# Patient Record
Sex: Female | Born: 1935
Health system: Southern US, Community
[De-identification: ages and names within clinical notes are randomized; demographics above are authoritative.]

## PROBLEM LIST (undated history)

## (undated) DIAGNOSIS — I679 Cerebrovascular disease, unspecified: Secondary | ICD-10-CM

## (undated) DIAGNOSIS — F319 Bipolar disorder, unspecified: Secondary | ICD-10-CM

## (undated) DIAGNOSIS — F29 Unspecified psychosis not due to a substance or known physiological condition: Secondary | ICD-10-CM

## (undated) DIAGNOSIS — A0472 Enterocolitis due to Clostridium difficile, not specified as recurrent: Secondary | ICD-10-CM

## (undated) DIAGNOSIS — Z8679 Personal history of other diseases of the circulatory system: Secondary | ICD-10-CM

## (undated) DIAGNOSIS — IMO0002 Reserved for concepts with insufficient information to code with codable children: Secondary | ICD-10-CM

## (undated) DIAGNOSIS — J984 Other disorders of lung: Secondary | ICD-10-CM

## (undated) DIAGNOSIS — Z8639 Personal history of other endocrine, nutritional and metabolic disease: Secondary | ICD-10-CM

## (undated) DIAGNOSIS — K635 Polyp of colon: Secondary | ICD-10-CM

## (undated) DIAGNOSIS — R55 Syncope and collapse: Secondary | ICD-10-CM

## (undated) DIAGNOSIS — E669 Obesity, unspecified: Secondary | ICD-10-CM

## (undated) DIAGNOSIS — R413 Other amnesia: Secondary | ICD-10-CM

## (undated) DIAGNOSIS — E785 Hyperlipidemia, unspecified: Secondary | ICD-10-CM

## (undated) DIAGNOSIS — K5090 Crohn's disease, unspecified, without complications: Secondary | ICD-10-CM

## (undated) DIAGNOSIS — E039 Hypothyroidism, unspecified: Secondary | ICD-10-CM

## (undated) DIAGNOSIS — I2510 Atherosclerotic heart disease of native coronary artery without angina pectoris: Secondary | ICD-10-CM

## (undated) DIAGNOSIS — I10 Essential (primary) hypertension: Secondary | ICD-10-CM

## (undated) DIAGNOSIS — K589 Irritable bowel syndrome without diarrhea: Secondary | ICD-10-CM

## (undated) DIAGNOSIS — K5730 Diverticulosis of large intestine without perforation or abscess without bleeding: Secondary | ICD-10-CM

## (undated) DIAGNOSIS — Z87898 Personal history of other specified conditions: Secondary | ICD-10-CM

## (undated) DIAGNOSIS — M8580 Other specified disorders of bone density and structure, unspecified site: Secondary | ICD-10-CM

## (undated) DIAGNOSIS — G4733 Obstructive sleep apnea (adult) (pediatric): Secondary | ICD-10-CM

## (undated) DIAGNOSIS — A159 Respiratory tuberculosis unspecified: Secondary | ICD-10-CM

## (undated) DIAGNOSIS — G2581 Restless legs syndrome: Secondary | ICD-10-CM

## (undated) DIAGNOSIS — K219 Gastro-esophageal reflux disease without esophagitis: Secondary | ICD-10-CM

## (undated) DIAGNOSIS — M797 Fibromyalgia: Secondary | ICD-10-CM

## (undated) DIAGNOSIS — Z8611 Personal history of tuberculosis: Secondary | ICD-10-CM

## (undated) DIAGNOSIS — Z8709 Personal history of other diseases of the respiratory system: Secondary | ICD-10-CM

## (undated) DIAGNOSIS — C4A9 Merkel cell carcinoma, unspecified: Secondary | ICD-10-CM

## (undated) HISTORY — PX: INCISION AND DRAINAGE PERIRECTAL ABSCESS: SHX1804

## (undated) HISTORY — DX: Personal history of other specified conditions: Z87.898

## (undated) HISTORY — DX: Other specified disorders of bone density and structure, unspecified site: M85.80

## (undated) HISTORY — DX: Polyp of colon: K63.5

## (undated) HISTORY — PX: CHOLECYSTECTOMY: SHX55

## (undated) HISTORY — PX: CATARACT EXTRACTION: SUR2

## (undated) HISTORY — DX: Essential (primary) hypertension: I10

## (undated) HISTORY — PX: BLADDER SUSPENSION: SHX72

## (undated) HISTORY — DX: Cerebrovascular disease, unspecified: I67.9

## (undated) HISTORY — PX: APPENDECTOMY: SHX54

## (undated) HISTORY — DX: Personal history of other diseases of the respiratory system: Z87.09

## (undated) HISTORY — DX: Fibromyalgia: M79.7

## (undated) HISTORY — DX: Merkel cell carcinoma, unspecified: C4A.9

## (undated) HISTORY — DX: Hyperlipidemia, unspecified: E78.5

## (undated) HISTORY — DX: Crohn's disease, unspecified, without complications: K50.90

## (undated) HISTORY — DX: Restless legs syndrome: G25.81

## (undated) HISTORY — DX: Gastro-esophageal reflux disease without esophagitis: K21.9

## (undated) HISTORY — DX: Hypothyroidism, unspecified: E03.9

## (undated) HISTORY — DX: Unspecified psychosis not due to a substance or known physiological condition: F29

## (undated) HISTORY — DX: Diverticulosis of large intestine without perforation or abscess without bleeding: K57.30

## (undated) HISTORY — DX: Personal history of tuberculosis: Z86.11

## (undated) HISTORY — DX: Reserved for concepts with insufficient information to code with codable children: IMO0002

## (undated) HISTORY — DX: Obesity, unspecified: E66.9

## (undated) HISTORY — DX: Syncope and collapse: R55

## (undated) HISTORY — DX: Other disorders of lung: J98.4

## (undated) HISTORY — DX: Enterocolitis due to Clostridium difficile, not specified as recurrent: A04.72

## (undated) HISTORY — DX: Personal history of other endocrine, nutritional and metabolic disease: Z86.39

## (undated) HISTORY — DX: Personal history of other diseases of the circulatory system: Z86.79

## (undated) HISTORY — DX: Atherosclerotic heart disease of native coronary artery without angina pectoris: I25.10

## (undated) HISTORY — DX: Obstructive sleep apnea (adult) (pediatric): G47.33

## (undated) HISTORY — PX: TONSILLECTOMY AND ADENOIDECTOMY: SHX28

## (undated) HISTORY — DX: Other amnesia: R41.3

## (undated) HISTORY — PX: NASAL SINUS SURGERY: SHX719

## (undated) HISTORY — DX: Bipolar disorder, unspecified: F31.9

## (undated) HISTORY — PX: HEEL SPUR EXCISION: SHX1733

## (undated) HISTORY — DX: Irritable bowel syndrome, unspecified: K58.9

## (undated) HISTORY — PX: LUMBAR LAMINECTOMY: SHX95

---

## 1997-07-25 ENCOUNTER — Other Ambulatory Visit: Admission: RE | Admit: 1997-07-25 | Discharge: 1997-07-25 | Payer: Self-pay | Admitting: *Deleted

## 1997-09-28 ENCOUNTER — Ambulatory Visit (HOSPITAL_COMMUNITY): Admission: RE | Admit: 1997-09-28 | Discharge: 1997-09-28 | Payer: Self-pay | Admitting: Specialist

## 1997-11-09 ENCOUNTER — Other Ambulatory Visit: Admission: RE | Admit: 1997-11-09 | Discharge: 1997-11-09 | Payer: Self-pay | Admitting: Gynecology

## 1998-12-15 ENCOUNTER — Other Ambulatory Visit: Admission: RE | Admit: 1998-12-15 | Discharge: 1998-12-15 | Payer: Self-pay | Admitting: Gynecology

## 1999-01-30 ENCOUNTER — Other Ambulatory Visit: Admission: RE | Admit: 1999-01-30 | Discharge: 1999-01-30 | Payer: Self-pay | Admitting: Gynecology

## 1999-01-30 ENCOUNTER — Encounter (INDEPENDENT_AMBULATORY_CARE_PROVIDER_SITE_OTHER): Payer: Self-pay | Admitting: Specialist

## 1999-01-31 ENCOUNTER — Encounter: Admission: RE | Admit: 1999-01-31 | Discharge: 1999-01-31 | Payer: Self-pay | Admitting: *Deleted

## 1999-01-31 ENCOUNTER — Encounter: Payer: Self-pay | Admitting: *Deleted

## 1999-02-01 ENCOUNTER — Ambulatory Visit (HOSPITAL_BASED_OUTPATIENT_CLINIC_OR_DEPARTMENT_OTHER): Admission: RE | Admit: 1999-02-01 | Discharge: 1999-02-01 | Payer: Self-pay | Admitting: *Deleted

## 1999-02-01 ENCOUNTER — Encounter (INDEPENDENT_AMBULATORY_CARE_PROVIDER_SITE_OTHER): Payer: Self-pay | Admitting: Specialist

## 1999-03-02 ENCOUNTER — Encounter (INDEPENDENT_AMBULATORY_CARE_PROVIDER_SITE_OTHER): Payer: Self-pay | Admitting: Specialist

## 1999-03-02 ENCOUNTER — Other Ambulatory Visit: Admission: RE | Admit: 1999-03-02 | Discharge: 1999-03-02 | Payer: Self-pay | Admitting: Gynecology

## 1999-12-26 ENCOUNTER — Other Ambulatory Visit: Admission: RE | Admit: 1999-12-26 | Discharge: 1999-12-26 | Payer: Self-pay | Admitting: Gynecology

## 2000-03-24 ENCOUNTER — Encounter: Payer: Self-pay | Admitting: Orthopedic Surgery

## 2000-03-28 ENCOUNTER — Observation Stay (HOSPITAL_COMMUNITY): Admission: RE | Admit: 2000-03-28 | Discharge: 2000-03-29 | Payer: Self-pay | Admitting: Orthopedic Surgery

## 2000-07-30 ENCOUNTER — Ambulatory Visit (HOSPITAL_COMMUNITY): Admission: RE | Admit: 2000-07-30 | Discharge: 2000-07-30 | Payer: Self-pay | Admitting: Specialist

## 2001-01-28 ENCOUNTER — Other Ambulatory Visit: Admission: RE | Admit: 2001-01-28 | Discharge: 2001-01-28 | Payer: Self-pay | Admitting: Gynecology

## 2001-03-13 ENCOUNTER — Ambulatory Visit (HOSPITAL_BASED_OUTPATIENT_CLINIC_OR_DEPARTMENT_OTHER): Admission: RE | Admit: 2001-03-13 | Discharge: 2001-03-13 | Payer: Self-pay | Admitting: Urology

## 2002-01-25 ENCOUNTER — Other Ambulatory Visit: Admission: RE | Admit: 2002-01-25 | Discharge: 2002-01-25 | Payer: Self-pay | Admitting: Gynecology

## 2002-09-19 ENCOUNTER — Ambulatory Visit (HOSPITAL_BASED_OUTPATIENT_CLINIC_OR_DEPARTMENT_OTHER): Admission: RE | Admit: 2002-09-19 | Discharge: 2002-09-19 | Payer: Self-pay | Admitting: Neurology

## 2002-10-24 ENCOUNTER — Ambulatory Visit (HOSPITAL_BASED_OUTPATIENT_CLINIC_OR_DEPARTMENT_OTHER): Admission: RE | Admit: 2002-10-24 | Discharge: 2002-10-24 | Payer: Self-pay | Admitting: Neurology

## 2003-02-21 ENCOUNTER — Other Ambulatory Visit: Admission: RE | Admit: 2003-02-21 | Discharge: 2003-02-21 | Payer: Self-pay | Admitting: Gynecology

## 2003-05-06 ENCOUNTER — Ambulatory Visit (HOSPITAL_COMMUNITY): Admission: RE | Admit: 2003-05-06 | Discharge: 2003-05-07 | Payer: Self-pay | Admitting: Internal Medicine

## 2003-11-10 ENCOUNTER — Ambulatory Visit: Payer: Self-pay | Admitting: Internal Medicine

## 2003-11-17 ENCOUNTER — Ambulatory Visit: Payer: Self-pay | Admitting: Pulmonary Disease

## 2003-11-23 ENCOUNTER — Ambulatory Visit: Payer: Self-pay | Admitting: *Deleted

## 2003-12-02 ENCOUNTER — Ambulatory Visit: Payer: Self-pay | Admitting: Internal Medicine

## 2003-12-16 ENCOUNTER — Ambulatory Visit: Payer: Self-pay | Admitting: Internal Medicine

## 2003-12-27 ENCOUNTER — Ambulatory Visit: Payer: Self-pay | Admitting: Pulmonary Disease

## 2003-12-30 ENCOUNTER — Ambulatory Visit: Payer: Self-pay | Admitting: Cardiology

## 2004-01-06 ENCOUNTER — Ambulatory Visit: Payer: Self-pay | Admitting: Internal Medicine

## 2004-01-12 ENCOUNTER — Ambulatory Visit: Payer: Self-pay | Admitting: Cardiology

## 2004-02-09 ENCOUNTER — Ambulatory Visit: Payer: Self-pay | Admitting: Cardiology

## 2004-02-14 ENCOUNTER — Ambulatory Visit: Payer: Self-pay | Admitting: Cardiology

## 2004-02-23 ENCOUNTER — Other Ambulatory Visit: Admission: RE | Admit: 2004-02-23 | Discharge: 2004-02-23 | Payer: Self-pay | Admitting: *Deleted

## 2004-03-06 ENCOUNTER — Ambulatory Visit: Payer: Self-pay | Admitting: Internal Medicine

## 2004-03-07 ENCOUNTER — Ambulatory Visit: Payer: Self-pay | Admitting: Cardiology

## 2004-03-14 ENCOUNTER — Ambulatory Visit: Payer: Self-pay | Admitting: Internal Medicine

## 2004-03-14 ENCOUNTER — Ambulatory Visit: Payer: Self-pay

## 2004-03-28 ENCOUNTER — Ambulatory Visit: Payer: Self-pay | Admitting: Cardiology

## 2004-04-05 ENCOUNTER — Ambulatory Visit: Payer: Self-pay | Admitting: Internal Medicine

## 2004-04-11 ENCOUNTER — Ambulatory Visit: Payer: Self-pay | Admitting: Internal Medicine

## 2004-04-12 ENCOUNTER — Ambulatory Visit: Payer: Self-pay | Admitting: Gastroenterology

## 2004-04-16 ENCOUNTER — Ambulatory Visit: Payer: Self-pay | Admitting: Gastroenterology

## 2004-05-08 ENCOUNTER — Ambulatory Visit: Payer: Self-pay | Admitting: Gastroenterology

## 2004-05-16 ENCOUNTER — Ambulatory Visit: Payer: Self-pay | Admitting: Cardiology

## 2004-05-16 ENCOUNTER — Ambulatory Visit: Payer: Self-pay | Admitting: Internal Medicine

## 2004-05-17 ENCOUNTER — Ambulatory Visit: Payer: Self-pay | Admitting: Gastroenterology

## 2004-05-23 ENCOUNTER — Ambulatory Visit (HOSPITAL_COMMUNITY): Admission: RE | Admit: 2004-05-23 | Discharge: 2004-05-24 | Payer: Self-pay | Admitting: Internal Medicine

## 2004-05-24 ENCOUNTER — Ambulatory Visit: Payer: Self-pay | Admitting: Internal Medicine

## 2004-05-29 ENCOUNTER — Ambulatory Visit: Payer: Self-pay | Admitting: Gastroenterology

## 2004-05-31 ENCOUNTER — Ambulatory Visit: Payer: Self-pay | Admitting: Gastroenterology

## 2004-06-05 ENCOUNTER — Ambulatory Visit: Payer: Self-pay | Admitting: Pulmonary Disease

## 2004-06-18 ENCOUNTER — Encounter: Admission: RE | Admit: 2004-06-18 | Discharge: 2004-06-18 | Payer: Self-pay | Admitting: Orthopedic Surgery

## 2004-06-28 ENCOUNTER — Ambulatory Visit: Payer: Self-pay | Admitting: Gastroenterology

## 2004-07-16 ENCOUNTER — Ambulatory Visit: Payer: Self-pay | Admitting: Internal Medicine

## 2004-07-19 ENCOUNTER — Ambulatory Visit: Payer: Self-pay | Admitting: Endocrinology

## 2004-07-31 ENCOUNTER — Ambulatory Visit: Payer: Self-pay | Admitting: Gastroenterology

## 2004-08-31 ENCOUNTER — Ambulatory Visit: Payer: Self-pay | Admitting: Cardiology

## 2004-09-06 ENCOUNTER — Ambulatory Visit: Payer: Self-pay | Admitting: Gastroenterology

## 2004-09-18 ENCOUNTER — Ambulatory Visit: Payer: Self-pay | Admitting: Cardiology

## 2004-09-27 ENCOUNTER — Ambulatory Visit: Payer: Self-pay | Admitting: Cardiology

## 2004-10-02 ENCOUNTER — Ambulatory Visit: Payer: Self-pay | Admitting: Cardiology

## 2004-10-02 ENCOUNTER — Observation Stay (HOSPITAL_COMMUNITY): Admission: EM | Admit: 2004-10-02 | Discharge: 2004-10-03 | Payer: Self-pay | Admitting: Emergency Medicine

## 2004-10-05 ENCOUNTER — Ambulatory Visit: Payer: Self-pay | Admitting: Pulmonary Disease

## 2004-10-22 ENCOUNTER — Ambulatory Visit: Payer: Self-pay | Admitting: Pulmonary Disease

## 2004-10-25 ENCOUNTER — Ambulatory Visit: Payer: Self-pay | Admitting: Cardiology

## 2004-12-06 ENCOUNTER — Ambulatory Visit: Payer: Self-pay | Admitting: Pulmonary Disease

## 2004-12-17 ENCOUNTER — Ambulatory Visit (HOSPITAL_COMMUNITY): Admission: RE | Admit: 2004-12-17 | Discharge: 2004-12-17 | Payer: Self-pay | Admitting: Pulmonary Disease

## 2004-12-24 ENCOUNTER — Inpatient Hospital Stay (HOSPITAL_COMMUNITY): Admission: AD | Admit: 2004-12-24 | Discharge: 2004-12-26 | Payer: Self-pay | Admitting: Pulmonary Disease

## 2004-12-24 ENCOUNTER — Ambulatory Visit: Payer: Self-pay | Admitting: Pulmonary Disease

## 2005-01-07 HISTORY — PX: OTHER SURGICAL HISTORY: SHX169

## 2005-01-15 ENCOUNTER — Ambulatory Visit: Payer: Self-pay | Admitting: Pulmonary Disease

## 2005-01-23 ENCOUNTER — Ambulatory Visit: Payer: Self-pay | Admitting: Endocrinology

## 2005-01-31 ENCOUNTER — Inpatient Hospital Stay (HOSPITAL_COMMUNITY): Admission: EM | Admit: 2005-01-31 | Discharge: 2005-02-01 | Payer: Self-pay | Admitting: Emergency Medicine

## 2005-01-31 ENCOUNTER — Ambulatory Visit: Payer: Self-pay | Admitting: *Deleted

## 2005-01-31 ENCOUNTER — Ambulatory Visit: Payer: Self-pay | Admitting: Cardiology

## 2005-02-14 ENCOUNTER — Ambulatory Visit: Payer: Self-pay | Admitting: *Deleted

## 2005-02-26 ENCOUNTER — Other Ambulatory Visit: Admission: RE | Admit: 2005-02-26 | Discharge: 2005-02-26 | Payer: Self-pay | Admitting: Gynecology

## 2005-03-05 ENCOUNTER — Ambulatory Visit: Payer: Self-pay | Admitting: Pulmonary Disease

## 2005-03-07 ENCOUNTER — Ambulatory Visit: Payer: Self-pay | Admitting: Internal Medicine

## 2005-03-18 ENCOUNTER — Ambulatory Visit: Payer: Self-pay | Admitting: Pulmonary Disease

## 2005-04-26 ENCOUNTER — Ambulatory Visit: Payer: Self-pay | Admitting: Internal Medicine

## 2005-05-08 ENCOUNTER — Ambulatory Visit: Payer: Self-pay | Admitting: Internal Medicine

## 2005-05-13 ENCOUNTER — Ambulatory Visit: Payer: Self-pay | Admitting: Cardiology

## 2005-05-21 ENCOUNTER — Inpatient Hospital Stay (HOSPITAL_COMMUNITY): Admission: RE | Admit: 2005-05-21 | Discharge: 2005-05-25 | Payer: Self-pay | Admitting: Specialist

## 2005-05-21 ENCOUNTER — Encounter (INDEPENDENT_AMBULATORY_CARE_PROVIDER_SITE_OTHER): Payer: Self-pay | Admitting: Specialist

## 2005-06-19 ENCOUNTER — Ambulatory Visit: Payer: Self-pay | Admitting: Pulmonary Disease

## 2005-06-20 ENCOUNTER — Ambulatory Visit: Payer: Self-pay | Admitting: Cardiology

## 2005-08-16 ENCOUNTER — Ambulatory Visit: Payer: Self-pay | Admitting: Cardiology

## 2005-08-22 ENCOUNTER — Ambulatory Visit: Payer: Self-pay | Admitting: Cardiology

## 2005-09-23 ENCOUNTER — Encounter: Admission: RE | Admit: 2005-09-23 | Discharge: 2005-09-23 | Payer: Self-pay | Admitting: Endocrinology

## 2005-09-24 ENCOUNTER — Ambulatory Visit: Payer: Self-pay | Admitting: Family Medicine

## 2005-10-25 ENCOUNTER — Ambulatory Visit: Payer: Self-pay | Admitting: Pulmonary Disease

## 2005-11-05 ENCOUNTER — Ambulatory Visit: Payer: Self-pay

## 2005-11-07 ENCOUNTER — Ambulatory Visit (HOSPITAL_COMMUNITY): Admission: RE | Admit: 2005-11-07 | Discharge: 2005-11-08 | Payer: Self-pay | Admitting: Surgery

## 2005-11-14 ENCOUNTER — Ambulatory Visit: Payer: Self-pay | Admitting: Internal Medicine

## 2005-12-12 ENCOUNTER — Ambulatory Visit: Payer: Self-pay | Admitting: Internal Medicine

## 2006-01-22 ENCOUNTER — Ambulatory Visit: Payer: Self-pay | Admitting: Pulmonary Disease

## 2006-02-10 ENCOUNTER — Ambulatory Visit: Payer: Self-pay

## 2006-02-10 LAB — CONVERTED CEMR LAB
ALT: 55 units/L — ABNORMAL HIGH (ref 0–40)
Alkaline Phosphatase: 53 units/L (ref 39–117)
Total Bilirubin: 0.5 mg/dL (ref 0.3–1.2)

## 2006-02-13 ENCOUNTER — Ambulatory Visit: Payer: Self-pay | Admitting: Cardiology

## 2006-02-27 ENCOUNTER — Other Ambulatory Visit: Admission: RE | Admit: 2006-02-27 | Discharge: 2006-02-27 | Payer: Self-pay | Admitting: Gynecology

## 2006-04-22 ENCOUNTER — Ambulatory Visit: Payer: Self-pay

## 2006-04-22 LAB — CONVERTED CEMR LAB
Alkaline Phosphatase: 50 units/L (ref 39–117)
Bilirubin, Direct: 0.1 mg/dL (ref 0.0–0.3)
Total Bilirubin: 0.6 mg/dL (ref 0.3–1.2)
Total Protein: 6.7 g/dL (ref 6.0–8.3)
Triglycerides: 131 mg/dL (ref 0–149)

## 2006-04-24 ENCOUNTER — Ambulatory Visit: Payer: Self-pay | Admitting: Internal Medicine

## 2006-05-16 ENCOUNTER — Ambulatory Visit: Payer: Self-pay

## 2006-05-16 LAB — CONVERTED CEMR LAB
BUN: 12 mg/dL (ref 6–23)
Basophils Relative: 0.7 % (ref 0.0–1.0)
CO2: 31 meq/L (ref 19–32)
Chloride: 104 meq/L (ref 96–112)
GFR calc Af Amer: 91 mL/min
HCT: 40.4 % (ref 36.0–46.0)
Lymphocytes Relative: 57.2 % — ABNORMAL HIGH (ref 12.0–46.0)
MCHC: 35 g/dL (ref 30.0–36.0)
MCV: 94 fL (ref 78.0–100.0)
Monocytes Absolute: 0.8 10*3/uL — ABNORMAL HIGH (ref 0.2–0.7)
Platelets: 205 10*3/uL (ref 150–400)
Potassium: 5.1 meq/L (ref 3.5–5.1)
Sodium: 140 meq/L (ref 135–145)

## 2006-05-23 ENCOUNTER — Ambulatory Visit: Payer: Self-pay

## 2006-05-23 LAB — CONVERTED CEMR LAB
Chloride: 105 meq/L (ref 96–112)
Creatinine, Ser: 0.8 mg/dL (ref 0.4–1.2)
Potassium: 5.1 meq/L (ref 3.5–5.1)
Sodium: 141 meq/L (ref 135–145)

## 2006-06-11 ENCOUNTER — Ambulatory Visit: Payer: Self-pay | Admitting: Internal Medicine

## 2006-06-30 ENCOUNTER — Ambulatory Visit: Payer: Self-pay | Admitting: Internal Medicine

## 2006-07-24 ENCOUNTER — Ambulatory Visit: Payer: Self-pay | Admitting: Pulmonary Disease

## 2006-08-25 ENCOUNTER — Ambulatory Visit: Payer: Self-pay | Admitting: Internal Medicine

## 2006-09-09 ENCOUNTER — Ambulatory Visit: Payer: Self-pay | Admitting: Internal Medicine

## 2006-09-09 LAB — CONVERTED CEMR LAB
CO2: 33 meq/L — ABNORMAL HIGH (ref 19–32)
Chloride: 97 meq/L (ref 96–112)
Creatinine, Ser: 0.7 mg/dL (ref 0.4–1.2)
GFR calc non Af Amer: 87 mL/min
Glucose, Bld: 99 mg/dL (ref 70–99)
Sodium: 136 meq/L (ref 135–145)

## 2006-09-30 ENCOUNTER — Ambulatory Visit: Payer: Self-pay | Admitting: Internal Medicine

## 2006-10-21 ENCOUNTER — Encounter: Payer: Self-pay | Admitting: *Deleted

## 2006-10-21 DIAGNOSIS — Z8639 Personal history of other endocrine, nutritional and metabolic disease: Secondary | ICD-10-CM

## 2006-10-21 DIAGNOSIS — Z862 Personal history of diseases of the blood and blood-forming organs and certain disorders involving the immune mechanism: Secondary | ICD-10-CM

## 2006-10-24 ENCOUNTER — Ambulatory Visit: Payer: Self-pay | Admitting: Pulmonary Disease

## 2006-10-27 ENCOUNTER — Ambulatory Visit: Payer: Self-pay | Admitting: Cardiology

## 2006-10-27 LAB — CONVERTED CEMR LAB
Alkaline Phosphatase: 52 units/L (ref 39–117)
Bilirubin, Direct: 0.1 mg/dL (ref 0.0–0.3)
HDL: 37.8 mg/dL — ABNORMAL LOW (ref >39.0)
LDL Cholesterol: 64 mg/dL (ref 0–99)
Total CHOL/HDL Ratio: 3.4
Total Protein: 6.7 g/dL (ref 6.0–8.3)

## 2006-10-30 ENCOUNTER — Ambulatory Visit: Payer: Self-pay | Admitting: Cardiology

## 2006-10-31 ENCOUNTER — Ambulatory Visit: Payer: Self-pay | Admitting: Pulmonary Disease

## 2006-11-17 ENCOUNTER — Ambulatory Visit: Payer: Self-pay | Admitting: Internal Medicine

## 2007-01-09 ENCOUNTER — Telehealth: Payer: Self-pay | Admitting: Pulmonary Disease

## 2007-01-12 ENCOUNTER — Ambulatory Visit: Payer: Self-pay | Admitting: Pulmonary Disease

## 2007-01-12 DIAGNOSIS — G4733 Obstructive sleep apnea (adult) (pediatric): Secondary | ICD-10-CM

## 2007-01-12 DIAGNOSIS — IMO0001 Reserved for inherently not codable concepts without codable children: Secondary | ICD-10-CM

## 2007-01-12 DIAGNOSIS — E785 Hyperlipidemia, unspecified: Secondary | ICD-10-CM | POA: Insufficient documentation

## 2007-01-12 DIAGNOSIS — I10 Essential (primary) hypertension: Secondary | ICD-10-CM

## 2007-01-13 ENCOUNTER — Encounter: Payer: Self-pay | Admitting: Pulmonary Disease

## 2007-01-28 ENCOUNTER — Ambulatory Visit: Payer: Self-pay | Admitting: Pulmonary Disease

## 2007-01-29 DIAGNOSIS — I2510 Atherosclerotic heart disease of native coronary artery without angina pectoris: Secondary | ICD-10-CM

## 2007-01-29 DIAGNOSIS — I4892 Unspecified atrial flutter: Secondary | ICD-10-CM

## 2007-01-29 DIAGNOSIS — R519 Headache, unspecified: Secondary | ICD-10-CM | POA: Insufficient documentation

## 2007-01-29 DIAGNOSIS — E669 Obesity, unspecified: Secondary | ICD-10-CM

## 2007-01-29 DIAGNOSIS — K219 Gastro-esophageal reflux disease without esophagitis: Secondary | ICD-10-CM

## 2007-01-29 DIAGNOSIS — R51 Headache: Secondary | ICD-10-CM

## 2007-01-29 DIAGNOSIS — M949 Disorder of cartilage, unspecified: Secondary | ICD-10-CM

## 2007-01-29 DIAGNOSIS — M899 Disorder of bone, unspecified: Secondary | ICD-10-CM | POA: Insufficient documentation

## 2007-02-18 ENCOUNTER — Encounter: Payer: Self-pay | Admitting: Pulmonary Disease

## 2007-03-02 ENCOUNTER — Other Ambulatory Visit: Admission: RE | Admit: 2007-03-02 | Discharge: 2007-03-02 | Payer: Self-pay | Admitting: Gynecology

## 2007-03-10 ENCOUNTER — Telehealth (INDEPENDENT_AMBULATORY_CARE_PROVIDER_SITE_OTHER): Payer: Self-pay | Admitting: *Deleted

## 2007-03-13 ENCOUNTER — Encounter: Payer: Self-pay | Admitting: Pulmonary Disease

## 2007-03-18 ENCOUNTER — Encounter: Payer: Self-pay | Admitting: Pulmonary Disease

## 2007-04-09 ENCOUNTER — Ambulatory Visit: Payer: Self-pay | Admitting: Internal Medicine

## 2007-04-09 LAB — CONVERTED CEMR LAB
ALT: 49 units/L — ABNORMAL HIGH (ref 0–35)
AST: 42 units/L — ABNORMAL HIGH (ref 0–37)
Albumin: 3.5 g/dL (ref 3.5–5.2)
Bilirubin, Direct: 0.1 mg/dL (ref 0.0–0.3)
Total Bilirubin: 0.8 mg/dL (ref 0.3–1.2)
Total Protein: 6.6 g/dL (ref 6.0–8.3)
Triglycerides: 102 mg/dL (ref 0–149)

## 2007-04-27 ENCOUNTER — Encounter: Payer: Self-pay | Admitting: Pulmonary Disease

## 2007-04-28 ENCOUNTER — Ambulatory Visit: Payer: Self-pay | Admitting: Pulmonary Disease

## 2007-04-28 DIAGNOSIS — R55 Syncope and collapse: Secondary | ICD-10-CM | POA: Insufficient documentation

## 2007-04-29 DIAGNOSIS — D126 Benign neoplasm of colon, unspecified: Secondary | ICD-10-CM | POA: Insufficient documentation

## 2007-04-29 DIAGNOSIS — K589 Irritable bowel syndrome without diarrhea: Secondary | ICD-10-CM

## 2007-04-29 DIAGNOSIS — K5730 Diverticulosis of large intestine without perforation or abscess without bleeding: Secondary | ICD-10-CM | POA: Insufficient documentation

## 2007-04-29 DIAGNOSIS — E039 Hypothyroidism, unspecified: Secondary | ICD-10-CM | POA: Insufficient documentation

## 2007-04-29 DIAGNOSIS — J42 Unspecified chronic bronchitis: Secondary | ICD-10-CM

## 2007-04-30 ENCOUNTER — Telehealth (INDEPENDENT_AMBULATORY_CARE_PROVIDER_SITE_OTHER): Payer: Self-pay | Admitting: *Deleted

## 2007-05-01 ENCOUNTER — Telehealth: Payer: Self-pay | Admitting: Pulmonary Disease

## 2007-05-06 ENCOUNTER — Ambulatory Visit: Payer: Self-pay | Admitting: Internal Medicine

## 2007-05-08 ENCOUNTER — Telehealth: Payer: Self-pay | Admitting: Pulmonary Disease

## 2007-05-11 ENCOUNTER — Encounter: Admission: RE | Admit: 2007-05-11 | Discharge: 2007-05-11 | Payer: Self-pay | Admitting: Neurology

## 2007-05-18 ENCOUNTER — Encounter: Payer: Self-pay | Admitting: Pulmonary Disease

## 2007-05-25 ENCOUNTER — Ambulatory Visit: Payer: Self-pay | Admitting: Cardiology

## 2007-07-27 ENCOUNTER — Encounter: Payer: Self-pay | Admitting: Pulmonary Disease

## 2007-08-12 ENCOUNTER — Ambulatory Visit: Payer: Self-pay | Admitting: Pulmonary Disease

## 2007-08-12 DIAGNOSIS — I679 Cerebrovascular disease, unspecified: Secondary | ICD-10-CM

## 2007-08-19 ENCOUNTER — Encounter: Payer: Self-pay | Admitting: Pulmonary Disease

## 2007-08-21 ENCOUNTER — Encounter: Payer: Self-pay | Admitting: Pulmonary Disease

## 2007-08-25 ENCOUNTER — Encounter: Payer: Self-pay | Admitting: Pulmonary Disease

## 2007-09-01 ENCOUNTER — Encounter: Payer: Self-pay | Admitting: Pulmonary Disease

## 2007-09-11 ENCOUNTER — Ambulatory Visit: Payer: Self-pay | Admitting: Internal Medicine

## 2007-09-16 ENCOUNTER — Encounter: Payer: Self-pay | Admitting: Internal Medicine

## 2007-09-18 ENCOUNTER — Telehealth (INDEPENDENT_AMBULATORY_CARE_PROVIDER_SITE_OTHER): Payer: Self-pay | Admitting: *Deleted

## 2007-09-23 ENCOUNTER — Encounter: Payer: Self-pay | Admitting: Pulmonary Disease

## 2007-10-06 ENCOUNTER — Encounter: Payer: Self-pay | Admitting: Pulmonary Disease

## 2007-10-19 ENCOUNTER — Ambulatory Visit: Payer: Self-pay | Admitting: Cardiology

## 2007-10-19 ENCOUNTER — Inpatient Hospital Stay (HOSPITAL_COMMUNITY): Admission: AD | Admit: 2007-10-19 | Discharge: 2007-10-23 | Payer: Self-pay | Admitting: Gastroenterology

## 2007-10-21 ENCOUNTER — Encounter (INDEPENDENT_AMBULATORY_CARE_PROVIDER_SITE_OTHER): Payer: Self-pay | Admitting: Gastroenterology

## 2007-10-21 ENCOUNTER — Encounter (INDEPENDENT_AMBULATORY_CARE_PROVIDER_SITE_OTHER): Payer: Self-pay | Admitting: Internal Medicine

## 2007-10-23 ENCOUNTER — Telehealth: Payer: Self-pay | Admitting: Pulmonary Disease

## 2007-11-13 ENCOUNTER — Telehealth: Payer: Self-pay | Admitting: Pulmonary Disease

## 2007-11-16 ENCOUNTER — Encounter: Payer: Self-pay | Admitting: Adult Health

## 2007-11-16 ENCOUNTER — Telehealth: Payer: Self-pay | Admitting: Pulmonary Disease

## 2007-11-17 ENCOUNTER — Encounter: Payer: Self-pay | Admitting: Adult Health

## 2007-11-18 ENCOUNTER — Encounter: Payer: Self-pay | Admitting: Adult Health

## 2007-12-09 ENCOUNTER — Encounter: Payer: Self-pay | Admitting: Adult Health

## 2007-12-11 ENCOUNTER — Telehealth (INDEPENDENT_AMBULATORY_CARE_PROVIDER_SITE_OTHER): Payer: Self-pay | Admitting: *Deleted

## 2007-12-14 ENCOUNTER — Ambulatory Visit: Payer: Self-pay | Admitting: Internal Medicine

## 2007-12-15 ENCOUNTER — Encounter: Payer: Self-pay | Admitting: Pulmonary Disease

## 2007-12-15 DIAGNOSIS — F29 Unspecified psychosis not due to a substance or known physiological condition: Secondary | ICD-10-CM | POA: Insufficient documentation

## 2007-12-15 DIAGNOSIS — K5090 Crohn's disease, unspecified, without complications: Secondary | ICD-10-CM

## 2008-01-11 ENCOUNTER — Ambulatory Visit: Payer: Self-pay | Admitting: Pulmonary Disease

## 2008-01-13 ENCOUNTER — Encounter: Payer: Self-pay | Admitting: Pulmonary Disease

## 2008-02-08 HISTORY — PX: OTHER SURGICAL HISTORY: SHX169

## 2008-02-11 ENCOUNTER — Ambulatory Visit: Payer: Self-pay | Admitting: Internal Medicine

## 2008-02-15 ENCOUNTER — Encounter: Payer: Self-pay | Admitting: Pulmonary Disease

## 2008-02-19 ENCOUNTER — Ambulatory Visit: Payer: Self-pay | Admitting: Internal Medicine

## 2008-02-19 LAB — CONVERTED CEMR LAB
AST: 20 units/L (ref 0–37)
Calcium: 9 mg/dL (ref 8.4–10.5)
Creatinine, Ser: 0.7 mg/dL (ref 0.4–1.2)
GFR calc Af Amer: 105 mL/min
GFR calc non Af Amer: 87 mL/min
HDL: 54.8 mg/dL (ref >39.0)
Potassium: 4.7 meq/L (ref 3.5–5.1)
Total Bilirubin: 0.5 mg/dL (ref 0.3–1.2)
Total Protein: 6.3 g/dL (ref 6.0–8.3)
Triglycerides: 108 mg/dL (ref 0–149)
VLDL: 22 mg/dL (ref 0–40)

## 2008-02-26 ENCOUNTER — Encounter: Payer: Self-pay | Admitting: Pulmonary Disease

## 2008-03-09 ENCOUNTER — Encounter: Payer: Self-pay | Admitting: Pulmonary Disease

## 2008-03-23 ENCOUNTER — Encounter: Payer: Self-pay | Admitting: Pulmonary Disease

## 2008-04-12 ENCOUNTER — Ambulatory Visit: Payer: Self-pay | Admitting: Pulmonary Disease

## 2008-04-15 ENCOUNTER — Encounter: Payer: Self-pay | Admitting: Pulmonary Disease

## 2008-05-07 DIAGNOSIS — IMO0001 Reserved for inherently not codable concepts without codable children: Secondary | ICD-10-CM

## 2008-05-07 HISTORY — DX: Reserved for inherently not codable concepts without codable children: IMO0001

## 2008-05-09 ENCOUNTER — Telehealth (INDEPENDENT_AMBULATORY_CARE_PROVIDER_SITE_OTHER): Payer: Self-pay | Admitting: *Deleted

## 2008-05-10 ENCOUNTER — Encounter: Payer: Self-pay | Admitting: Pulmonary Disease

## 2008-05-17 ENCOUNTER — Encounter (INDEPENDENT_AMBULATORY_CARE_PROVIDER_SITE_OTHER): Payer: Self-pay | Admitting: *Deleted

## 2008-05-18 ENCOUNTER — Encounter: Payer: Self-pay | Admitting: Pulmonary Disease

## 2008-06-03 ENCOUNTER — Encounter: Payer: Self-pay | Admitting: Pulmonary Disease

## 2008-06-09 ENCOUNTER — Ambulatory Visit: Payer: Self-pay | Admitting: Cardiology

## 2008-06-09 LAB — CONVERTED CEMR LAB
Alkaline Phosphatase: 64 units/L (ref 39–117)
Bilirubin, Direct: 0.1 mg/dL (ref 0.0–0.3)
Cholesterol: 148 mg/dL (ref 0–200)
HDL: 66.9 mg/dL (ref >39.00)
LDL Cholesterol: 62 mg/dL (ref 0–99)
Total CHOL/HDL Ratio: 2
Triglycerides: 94 mg/dL (ref 0.0–149.0)
VLDL: 18.8 mg/dL (ref 0.0–40.0)

## 2008-06-10 ENCOUNTER — Telehealth: Payer: Self-pay | Admitting: Cardiology

## 2008-06-10 ENCOUNTER — Telehealth: Payer: Self-pay | Admitting: Internal Medicine

## 2008-07-04 ENCOUNTER — Telehealth: Payer: Self-pay | Admitting: Cardiology

## 2008-07-22 ENCOUNTER — Encounter: Payer: Self-pay | Admitting: Adult Health

## 2008-07-22 ENCOUNTER — Ambulatory Visit: Payer: Self-pay | Admitting: Internal Medicine

## 2008-07-22 ENCOUNTER — Encounter: Payer: Self-pay | Admitting: Pulmonary Disease

## 2008-07-22 LAB — CONVERTED CEMR LAB
BUN: 10 mg/dL (ref 6–23)
Basophils Absolute: 0.1 10*3/uL (ref 0.0–0.1)
Bilirubin Urine: NEGATIVE
Glucose, Bld: 86 mg/dL (ref 70–99)
HCT: 39.2 % (ref 36.0–46.0)
Hemoglobin, Urine: NEGATIVE
Hemoglobin: 13.2 g/dL (ref 12.0–15.0)
Leukocytes, UA: NEGATIVE
MCHC: 33.7 g/dL (ref 30.0–36.0)
Monocytes Absolute: 0.4 10*3/uL (ref 0.1–1.0)
Neutrophils Relative %: 35.7 % — ABNORMAL LOW (ref 43.0–77.0)
Nitrite: NEGATIVE
Potassium: 4.6 meq/L (ref 3.5–5.1)
RBC: 4.08 M/uL (ref 3.87–5.11)
Sodium: 137 meq/L (ref 135–145)
TSH: 0.16 microintl units/mL — ABNORMAL LOW (ref 0.35–5.50)
Total Protein, Urine: NEGATIVE mg/dL
Urobilinogen, UA: 0.2 (ref 0.0–1.0)
WBC: 5.3 10*3/uL (ref 4.5–10.5)

## 2008-07-25 ENCOUNTER — Telehealth (INDEPENDENT_AMBULATORY_CARE_PROVIDER_SITE_OTHER): Payer: Self-pay | Admitting: *Deleted

## 2008-07-29 ENCOUNTER — Telehealth: Payer: Self-pay | Admitting: Pulmonary Disease

## 2008-09-22 ENCOUNTER — Encounter: Payer: Self-pay | Admitting: Pulmonary Disease

## 2008-10-11 ENCOUNTER — Ambulatory Visit: Payer: Self-pay | Admitting: Pulmonary Disease

## 2008-10-12 ENCOUNTER — Encounter (INDEPENDENT_AMBULATORY_CARE_PROVIDER_SITE_OTHER): Payer: Self-pay | Admitting: *Deleted

## 2008-10-16 DIAGNOSIS — G2581 Restless legs syndrome: Secondary | ICD-10-CM

## 2008-10-17 LAB — CONVERTED CEMR LAB: TSH: 10.04 microintl units/mL — ABNORMAL HIGH (ref 0.35–5.50)

## 2008-10-19 ENCOUNTER — Encounter: Payer: Self-pay | Admitting: Pulmonary Disease

## 2008-10-19 ENCOUNTER — Ambulatory Visit: Payer: Self-pay | Admitting: Internal Medicine

## 2008-11-02 ENCOUNTER — Ambulatory Visit: Payer: Self-pay | Admitting: Internal Medicine

## 2008-11-03 ENCOUNTER — Encounter: Payer: Self-pay | Admitting: Adult Health

## 2008-11-03 ENCOUNTER — Encounter: Payer: Self-pay | Admitting: Pulmonary Disease

## 2008-11-03 ENCOUNTER — Inpatient Hospital Stay (HOSPITAL_COMMUNITY): Admission: AD | Admit: 2008-11-03 | Discharge: 2008-11-05 | Payer: Self-pay | Admitting: Otolaryngology

## 2008-11-04 ENCOUNTER — Telehealth: Payer: Self-pay | Admitting: Adult Health

## 2008-11-09 ENCOUNTER — Encounter: Payer: Self-pay | Admitting: Pulmonary Disease

## 2008-11-16 ENCOUNTER — Telehealth: Payer: Self-pay | Admitting: Pulmonary Disease

## 2008-12-06 ENCOUNTER — Telehealth: Payer: Self-pay | Admitting: Pulmonary Disease

## 2008-12-19 ENCOUNTER — Telehealth: Payer: Self-pay | Admitting: Pulmonary Disease

## 2008-12-23 ENCOUNTER — Telehealth (INDEPENDENT_AMBULATORY_CARE_PROVIDER_SITE_OTHER): Payer: Self-pay | Admitting: *Deleted

## 2008-12-27 ENCOUNTER — Ambulatory Visit: Payer: Self-pay | Admitting: Internal Medicine

## 2008-12-28 ENCOUNTER — Telehealth: Payer: Self-pay | Admitting: Pulmonary Disease

## 2009-01-13 ENCOUNTER — Encounter: Payer: Self-pay | Admitting: Pulmonary Disease

## 2009-01-24 ENCOUNTER — Encounter: Payer: Self-pay | Admitting: Pulmonary Disease

## 2009-02-14 ENCOUNTER — Encounter: Payer: Self-pay | Admitting: Pulmonary Disease

## 2009-03-13 ENCOUNTER — Encounter: Payer: Self-pay | Admitting: Pulmonary Disease

## 2009-03-20 ENCOUNTER — Encounter: Payer: Self-pay | Admitting: Pulmonary Disease

## 2009-04-03 ENCOUNTER — Encounter: Payer: Self-pay | Admitting: Pulmonary Disease

## 2009-04-10 ENCOUNTER — Encounter: Payer: Self-pay | Admitting: Pulmonary Disease

## 2009-04-24 ENCOUNTER — Telehealth: Payer: Self-pay | Admitting: Internal Medicine

## 2009-04-24 ENCOUNTER — Encounter: Payer: Self-pay | Admitting: Pulmonary Disease

## 2009-04-25 ENCOUNTER — Telehealth: Payer: Self-pay | Admitting: Internal Medicine

## 2009-04-26 ENCOUNTER — Encounter: Payer: Self-pay | Admitting: Internal Medicine

## 2009-05-01 ENCOUNTER — Encounter: Payer: Self-pay | Admitting: Pulmonary Disease

## 2009-05-16 ENCOUNTER — Encounter: Payer: Self-pay | Admitting: Pulmonary Disease

## 2009-05-22 ENCOUNTER — Encounter: Payer: Self-pay | Admitting: Pulmonary Disease

## 2009-06-08 ENCOUNTER — Ambulatory Visit: Payer: Self-pay | Admitting: Pulmonary Disease

## 2009-06-09 LAB — CONVERTED CEMR LAB
ALT: 24 units/L (ref 0–35)
Albumin: 4.1 g/dL (ref 3.5–5.2)
Basophils Absolute: 0 10*3/uL (ref 0.0–0.1)
Basophils Relative: 0.7 % (ref 0.0–3.0)
Bilirubin, Direct: 0.1 mg/dL (ref 0.0–0.3)
Cholesterol: 186 mg/dL (ref 0–200)
Eosinophils Absolute: 0.1 10*3/uL (ref 0.0–0.7)
Eosinophils Relative: 2.1 % (ref 0.0–5.0)
Glucose, Bld: 87 mg/dL (ref 70–99)
HCT: 38 % (ref 36.0–46.0)
Hemoglobin: 13.2 g/dL (ref 12.0–15.0)
LDL Cholesterol: 88 mg/dL (ref 0–99)
Lymphs Abs: 2.9 10*3/uL (ref 0.7–4.0)
MCV: 94.9 fL (ref 78.0–100.0)
Monocytes Relative: 7.9 % (ref 3.0–12.0)
Neutrophils Relative %: 41.1 % — ABNORMAL LOW (ref 43.0–77.0)
Platelets: 137 10*3/uL — ABNORMAL LOW (ref 150.0–400.0)
Sodium: 143 meq/L (ref 135–145)
TSH: 6.84 microintl units/mL — ABNORMAL HIGH (ref 0.35–5.50)
Total Protein: 7.1 g/dL (ref 6.0–8.3)
VLDL: 15.2 mg/dL (ref 0.0–40.0)
WBC: 5.9 10*3/uL (ref 4.5–10.5)

## 2009-07-07 ENCOUNTER — Telehealth: Payer: Self-pay | Admitting: Internal Medicine

## 2009-07-24 ENCOUNTER — Encounter: Admission: RE | Admit: 2009-07-24 | Discharge: 2009-07-24 | Payer: Self-pay | Admitting: Gynecology

## 2009-08-15 ENCOUNTER — Encounter: Payer: Self-pay | Admitting: Pulmonary Disease

## 2009-08-18 ENCOUNTER — Encounter: Payer: Self-pay | Admitting: Pulmonary Disease

## 2009-08-25 ENCOUNTER — Telehealth: Payer: Self-pay | Admitting: Internal Medicine

## 2009-09-04 ENCOUNTER — Encounter: Payer: Self-pay | Admitting: Pulmonary Disease

## 2009-11-20 ENCOUNTER — Encounter: Payer: Self-pay | Admitting: Pulmonary Disease

## 2009-12-05 ENCOUNTER — Ambulatory Visit: Payer: Self-pay | Admitting: Pulmonary Disease

## 2009-12-14 DIAGNOSIS — D497 Neoplasm of unspecified behavior of endocrine glands and other parts of nervous system: Secondary | ICD-10-CM

## 2009-12-14 DIAGNOSIS — J984 Other disorders of lung: Secondary | ICD-10-CM | POA: Insufficient documentation

## 2009-12-15 ENCOUNTER — Ambulatory Visit: Payer: Self-pay | Admitting: Internal Medicine

## 2009-12-15 ENCOUNTER — Encounter: Payer: Self-pay | Admitting: Internal Medicine

## 2009-12-18 ENCOUNTER — Encounter: Payer: Self-pay | Admitting: Pulmonary Disease

## 2010-01-27 ENCOUNTER — Encounter: Payer: Self-pay | Admitting: Orthopedic Surgery

## 2010-01-28 ENCOUNTER — Encounter: Payer: Self-pay | Admitting: Surgery

## 2010-02-06 NOTE — Letter (Signed)
Summary: [facility]   Imported By: Sherian Rein 06/08/2009 13:25:20  _____________________________________________________________________  External Attachment:    Type:   Image     Comment:   External Document

## 2010-02-06 NOTE — Progress Notes (Signed)
Summary: LIPITOR SENT TO [facility] TODAY  Phone Note Refill Request Message from:  Patient on August 25, 2009 9:39 AM  Refills Requested: Medication #1:  LIPITOR 40 MG TABS take one tablet by mouth at bedtime Send  [facility]  Initial call taken by: Delsa Sale,  August 25, 2009 9:40 AM    Prescriptions: LIPITOR 40 MG TABS (ATORVASTATIN CALCIUM) take one tablet by mouth at bedtime  #90 x 3   Entered by:   Julaine Hua, CMA   Authorized by:   Jolaine Artist, MD, [facility]   Signed by:   Julaine Hua, CMA on 08/25/2009   Method used:   Faxed to ...       [facility] [place]ation [place]  [address]       Ph: [phone]       Fax: [phone]   RxID:   [id number]

## 2010-02-06 NOTE — Progress Notes (Signed)
Summary: pt needs refill  Phone Note Refill Request Call back at Home Phone [phone] Message from:  [facility]  Refills Requested: Medication #1:  LIPITOR 40 MG TABS take one tablet by mouth at bedtime Initial call taken by: Shelda Pal,  July 07, 2009 8:43 AM    Prescriptions: LIPITOR 40 MG TABS (ATORVASTATIN CALCIUM) take one tablet by mouth at bedtime  #90 x 1   Entered by:   Julaine Hua, CMA   Authorized by:   Jolaine Artist, MD, [facility]   Signed by:   Julaine Hua, CMA on 07/07/2009   Method used:   Electronically to        [facility] [address] (mail-order)       [facility]       [address] [place]  [address]       Ph: [phone]       Fax: [phone]   [place]:   [phone]

## 2010-02-06 NOTE — Assessment & Plan Note (Signed)
Summary: 6 months/apc   Primary Care Provider:  Dr. Lorin Picket  CC:  6 month ROV & review of mult medical problems....  History of Present Illness: 77 y/o WF here for a follow up visit... she has mult med problems and meds as noted below...    ~  October 11, 2008:  she has updated list of all medical activities over the last 19mo w/ follow up evals by [facility] (GI), DrGross (Surg- perirectal abscess), DrPlovsky (Psychiatry), [facility] & XRT... also saw the nurse practitioiner in July for UTI (Rx Cipro), TSH too low & Levothy dose derc slightly... today c/o sinus congestion & drainage of "grass green mucous" which improved w/ regular dosing of Claritin, Saline, Mucinex, Flonase... also c/o incr RLS symptoms and we discussed incr the Mirapex Rx... BMD here 10/10 showed osteopenia in the hips> continue Fosamax etc...   ~  June 08, 2009:  alot to get caught up on> she saw [facility] for GI 5/11- doing well on Lialda, Questran & Nexium;  several additional eye operations from DrAFisher at [facility];  f/u eval by ENT at [facility] in [place];  Cardiac f/u DrBensimhon 12/10- doing well, same Rx, f/u 25yr;  etc...  she had a list of questions and minor somatic complaints (bruising, ecchymoses, gas, nasal crusts) which were discussed and dispatched in succession... f/u fasting labs today...   ~  December 05, 2009:  she has gained 18# in the last 19mo & we reviewed diet recommendations, & an exercise program for her... she has had more surg from [facility] on her left eyelid...  BP controlled on meds & denies CP/ palpit/ ch in SOB, etc;  no cerebral ischemic symptoms on the ASA;  Chol stable on Lip40;  GI followed by [facility];  Thyroid followed by drBalan;  FM followed by DrDeveshwar;  she remains on Fosamax/ Calcium/ MVI/ VitD for her osteopenia w/ last BMD 10/10...  she is c/o some "crusts" on her right nares & she is concerned due to prev hx MRSA on left treated by DrBates w/ Doxy & she wants this antibiotic now  for this new prob...    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  Hx of ENT Problem>  left nasal soft tissue abscess drained by DrBates- MRSA treated w/ drainage, IV antibiotics, then Doxy...  Hx of BRONCHITIS, RECURRENT - she uses Claritin & Flonase for sinuses. OTHER DISEASES OF LUNG NOT ELSEWHERE CLASSIFIED (ICD-518.89) - there is a  ~2cm left apical fluid density lesion ?nerve sheath tumor?   ~  CT Chest 3/10 (at [facility]) showed atherosclerotic dis in Ao & coronaries, calcif AoV, left lung apex extra-pleural mass  ~2cm, 4mm LUL nodule, scat ground-glass opac, atelec, marked biliary ductal dil- consider MRCP... (Old films and CT Chest here 6/07 & 10/09 compared- not mentioned on 2007 scan but the 2009 scan noted lesion left apex 2.6 x 2.0cm <sl larger than 2007 scan in retrospect> ?neurogenic/ nerve sheath tumor).  OBSTRUCTIVE SLEEP APNEA  - Hx mild OSA on Sleep Study by DrDohmeir... she never really used the CPAP and states that "now I'm fine off of it"... she does have some insomnia & uses BENEDRYL Prn.  HYPERTENSION (ICD-401.9) - controlled on COREG 25mg Bid... BP=134/62 and pressures are good at home etc...  denies HA, visual changes, CP, palipit, dizziness, dyspnea, edema, etc... she has chronic fatigue symptoms.  CORONARY ARTERY DISEASE (ICD-414.00) -  on COREG 25mg Bid, & supposed to be on ASA 81mg /d... non-obstructive disease w/ cath 9/06 showing 30% LAD lesion, good LVF... neg Myoview 3/06 no ischemia, EF=73%... she is followed by DrBensimhon for Cards.  Hx of ATRIAL FLUTTER (ICD-427.32) - S/P catheter ablation in 2005 by DrKlein.CEREBROVASCULAR DISEASE (ICD-437.9) - eval by DrWillis and DrBritz (Neurosurg @ [facility]) confirms 80% left middle cerebral art stenosis which is felt to be asymptomatic... they advise ASA 81mg /d, and secondary risk factor modification...  HYPERLIPIDEMIA  (ICD-272.4) - now on LIPITOR 40mg /d + FISH OIL Bid...   ~  FLP 10/08 on Vytor10/80 showed TChol 130, TG 139, HDL 38, LDL 64...  ~  FLP 4/09 on Vytor10/80 showed TChol 138, TG 102, HDL 62, LDL 56...  ~  Vytorin & Fish Oil stopped 11/09 in [place].  ~  Jan10: DrBensimhon started Lipitor 40/d.  ~  FLP 6/10 on Lip40 showed TChol 148, TG 94, HDL 67, LDL 62  ~  FLP 6/11 on Lip40 showed TChol 186, TG 76, HDL 82, LDL 88  OBESITY (ICD-278.00) - she notes that her fibromyalgia makes it difficult for her to exercise.  ~  weight 8/09 = 221#  ~  weight 12/09 = 197#  ~  weight 4/10 = 169#... great job w/ diet program.  ~  weight 6/11 = 173#  ~  weight 11/11 = 190#... we discussed diet + exercise.       *** GI - MANAGED BY DrJHAYES *** GASTROESOPHAGEAL REFLUX DISEASE (ICD-530.81) - followed by DrJHayes for GI on NEXIUM 40Bid... she has GERD, Hx gastric ulcer. DIVERTICULOSIS OF COLON (ICD-562.10) - hx diverticulitis in past. IRRITABLE BOWEL SYNDROME (ICD-564.1) - DrHayes' eval w/ colon reported to show "ulcers" c/w Crohn's dis... COLONIC POLYPS - Hx of divertics w/ diverticulitis in the past and IBS, and hx of Colon Polyps w/ prev colonoscopy in 4/06... see 10/09 hosp by [facility] and f/u colonoscopy w/ ?Crohn's... CROHN'S DISEASE (ICD-555.9) - on LIALDA 1.2gm- 3 tabs daily, QUESTRAN 2 scoops daily, & IMMODIUM Prn...       *** ENDOCRINE - MANAGED BY DrBALAN *** HYPERPARATHYROIDISM, HX OF (ICD-V12.2) - she had hypercalcemia w/ right inferior parathyroid gland resected 11/07 by DrGerkin... HYPOTHYROIDISM (ICD-244.9) - on SYNTHROID daily... prev followed by DrBalan.  ~  labs 6/07 showed TSH= 5.16... managed by DrBalan at that time.  ~  labs here 7/10 on Levothy112 showed TSH= 0.16... rec> decr to 1/2 tab & f/u lab.  ~  labs here 10/10 showed TSH= 10.04... rec> change to 37mcg/d.  ~  labs here 6/11 on Levothy75 showed TSH= 6.84... rec> step up to 96mcg/d.       *** RHEUM - MAMAGED BY  DrDEVESHWAR *** FIBROMYALGIA (ICD-729.1) - followed by DrDeveshwar... she uses DCN100 & Lidoderm patches for pain. OSTEOPENIA (ICD-733.90) - on FOSAMAX 70mg /wk, ca++, vits... prev followed by DrBalan...  ~  last BMD 9/08 showed TScores -0.8 in Spine & -2.3 in right FemNeck...  ~  f/u BMD 10/10 showed TScores invalid in Spine, & -2.2 in right FemNeck... continue Rx.  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  SYNCOPE (ICD-780.2) - after a trip to visit family in [place] she was flying home to [place] and passed out on the plane... she was taken to a hosp outside of [place] and adm for several days...  ~ syncopal spell "altered level of consciusness" she remembers being on the plane and next in the ER...  their DC  note indicates poss seizure but nothing to substantiate this (no seiz activ noted by anyone)...   ~  MRI Brain was neg x  max sinusitis... MRA however showed 80% stenosis in the left MCA...  ~  CDoppler showed mild plaque, norm waveforms, no signif stenoses...  ~  2DEcho showed basically WNL, borderline conc LVH, EF=55-60%...  RESTLESS LEG SYNDROME (ICD-333.94) - she's been on MIRAPEX 0.5mg  Qd...  ~  10/10: c/o incr symptoms and instructed to incr Mirapex to 2 tabs...  PSYCHOSIS (ICD-298.9) - this was attributed to Prednisone therapy for the Crohn's dis... hosp in [place] and mult meds discontinued and changed by Psychiatrists... now followed by [facility] and he is weaning her off of the DEPAKOTE & KEPPRA... & she is off the prev Zoloft & Restoril...   Preventive Screening-Counseling & Management  Alcohol-Tobacco     Smoking Status: quit     Year Quit: 1962     Pack years: 54 years  1 pack daily  Allergies: 1)  ! Demerol 2)  ! * Dilaudid 3)  ! Morphine 4)  ! Codeine 5)  ! * Fentanyl 6)  ! Prednisone 7)  Prozac (Fluoxetine Hcl) 8)  Qc Bacitracin (Bacitracin) 9)  Tegretol (Carbamazepine) 10)  Ultram (Tramadol Hcl) 11)  Inderal  La (Propranolol Hcl) 12)  Indocin (Indomethacin Sodium) 13)  Lisinopril (Lisinopril)  Comments:  Nurse/Medical Assistant: The patient's medications and allergies were reviewed with the patient and were updated in the Medication and Allergy Lists.  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) OBSTRUCTIVE SLEEP APNEA (ICD-327.23) Hx of BRONCHITIS, RECURRENT (ICD-491.9) OTHER DISEASES OF LUNG NOT ELSEWHERE CLASSIFIED (ICD-518.89) HYPERTENSION (ICD-401.9) CORONARY ARTERY DISEASE (ICD-414.00) Hx of ATRIAL FLUTTER (ICD-427.32) CEREBROVASCULAR DISEASE (ICD-437.9) HYPERLIPIDEMIA (ICD-272.4) OBESITY (ICD-278.00) GASTROESOPHAGEAL REFLUX DISEASE (ICD-530.81) DIVERTICULOSIS OF COLON (ICD-562.10) IRRITABLE BOWEL SYNDROME (ICD-564.1) COLONIC POLYPS (ICD-211.3) CROHN'S DISEASE (ICD-555.9) HYPOTHYROIDISM (ICD-244.9) HYPERPARATHYROIDISM, HX OF (ICD-V12.2) FIBROMYALGIA (ICD-729.1) OSTEOPENIA (ICD-733.90) Hx of HEADACHE, 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 grandmother hx of colon cancer mother deceased age 49 from bleeding ulcer  hx of HBP and arthritis father deceased age 8 from CHF 1 sibling deceased age 66 from PE 1 sibling alive age 23   1 sibling deceased as an infant from scarlet fever  Social History: Reviewed history from 06/09/2008 and no changes required. quit smoking in 1962  smoked for 5-6 years--discontinued due to dx of tuberculosis.   exposed to second hand smoke exercises 3 times per week no caffeine married 2 children  Review of Systems      See HPI       The patient complains of weight gain, dyspnea on exertion, and muscle weakness.  The patient denies  anorexia, fever, weight loss, vision loss, decreased hearing, hoarseness, chest pain, syncope, peripheral edema, prolonged cough, headaches, hemoptysis, abdominal pain, melena, hematochezia, severe indigestion/heartburn, hematuria, incontinence, suspicious skin lesions, transient blindness, difficulty walking, depression, unusual weight change, abnormal bleeding, enlarged lymph nodes, angioedema, and breast masses.    Vital Signs:  Patient profile:   77 year old female Height:      61 inches Weight:      190.25 pounds BMI:     36.08 O2 Sat:      96 % on Room air Temp:     97.2 degrees  F oral Pulse rate:   69 / minute BP sitting:   134 / 62  (left arm) Cuff size:   large  Vitals Entered By: Randell Loop CMA (December 05, 2009 10:27 AM)  O2 Sat at Rest %:  96 O2 Flow:  Room air CC: 6 month ROV & review of mult medical problems... Is Patient Diabetic? No Pain Assessment Patient in pain? no      Comments meds updated today with pt   Physical Exam  Additional Exam:  WD, Overweight, 77 y/o WF in NAD...  GENERAL:  Alert & oriented; pleasant & cooperative... HEENT:  [place]/AT, EOM-wnl, EACs-clear, TMs-wnl, NOSE-clear, THROAT-clear & wnl. ** s/p mult surg left lower lid- sl red, improved... NECK:  Supple w/ fairROM; no JVD; normal carotid impulses w/o bruits; no thyromegaly or nodules palpated; no lymphadenopathy. CHEST:  Clear to P & A; without wheezes/ rales/ or rhonchi. HEART:  Regular Rhythm; gr 1/6 SEM without rubs/ or gallops. ABDOMEN:  Obese soft & nontender; normal bowel sounds; no organomegaly or masses detected. EXT: without deformities, mild arthritic changes; no varicose veins/ +venous insuffic/ no edema... NEURO:  CN's intact; no focal neuro deficits... DERM:  No lesions noted; no rash etc...    MISC. Report  Procedure date:  12/05/2009  Findings:      DATA REVIEQWED:  ~  prev EMR notes...  ~  Interim notes from [facility] Ophthalmology Dept...  ~  prev CT scans in  Radiology section of EChart...   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 Mickie Kay...  Problem # 2:  OTHER DISEASES OF LUNG NOT ELSEWHERE CLASSIFIED (ICD-518.89) Prev CT scans & report from [facility] reviewed... she will need f/u CXR & CTscan on ret in 6months...  Problem # 3:  HYPERTENSION (ICD-401.9) BP Controlled on med>  continue same. Her updated medication list for this problem includes:    Carvedilol 25 Mg Tabs (Carvedilol) .Marland Kitchen... Take one tablet by mouth two times a day  Problem # 4:  CORONARY ARTERY DISEASE (ICD-414.00) Stable w/o angina- followed by DrBensimhon. Her updated medication list for this problem includes:    Aspirin 81 Mg Tbec (Aspirin) .Marland Kitchen... Take 1 tab by mouth once daily...    Carvedilol 25 Mg Tabs (Carvedilol) .Marland Kitchen... Take one tablet by mouth two times a day  Problem # 5:  CEREBROVASCULAR DISEASE (ICD-437.9) She has not had any cerebral ischemic symptoms...  Problem # 6:  HYPERLIPIDEMIA (ICD-272.4) Stable on Lipitor 40mg /d... Her updated medication list for this problem includes:    Lipitor 40 Mg Tabs (Atorvastatin calcium) .Marland Kitchen... Take one tablet by mouth at bedtime    Questran 4 Gm/dose Powd (Cholestyramine) .Marland Kitchen... 2 scoops per day  Problem # 7:  OBESITY (ICD-278.00) We discussed diet + exercise...  Problem # 8:  CROHN'S DISEASE (ICD-555.9) GI per [facility]...  Problem # 9:  HYPOTHYROIDISM (ICD-244.9) Endocrine per DrBalan... Her updated medication list for this problem includes:    Synthroid 88 Mcg Tabs (Levothyroxine sodium) .Marland Kitchen... Take one tablet by mouth once daily  Problem # 10:  FIBROMYALGIA (ICD-729.1) Rheum per DrDeveshwar... Her updated medication list for this problem includes:    Aspirin 81 Mg Tbec (Aspirin) .Marland Kitchen... Take 1 tab by mouth once daily...    Tramadol Hcl 50 Mg Tabs (Tramadol hcl) .Marland Kitchen... Take 1 tablet by mouth three times a day (not to exceed (3) tabs a day)  Complete  Medication List: 1)  Claritin 10 Mg Tabs (  Loratadine) .... Take 1 tablet by mouth once a day 2)  Flonase 50 Mcg/act Susp (Fluticasone propionate) .... 2 sprays in each nostirl once daily 3)  Aspirin 81 Mg Tbec (Aspirin) .... Take 1 tab by mouth once daily.Marland KitchenMarland Kitchen 4)  Carvedilol 25 Mg Tabs (Carvedilol) .... Take one tablet by mouth two times a day 5)  Lipitor 40 Mg Tabs (Atorvastatin calcium) .... Take one tablet by mouth at bedtime 6)  Synthroid 88 Mcg Tabs (Levothyroxine sodium) .... Take one tablet by mouth once daily 7)  Nexium 40 Mg Cpdr (Esomeprazole magnesium) .... Take 1 capsule by mouth 30 minutes before the first and last meals of the day 8)  Lialda 1.2 Gm Tbec (Mesalamine) .... Take 3  tabs by mouth once daily as directed by drhayes 9)  Questran 4 Gm/dose Powd (Cholestyramine) .... 2 scoops per day 10)  Omega-3 1000 Mg Caps (Omega-3 fatty acids) .... Take 2  capsule by mouth once daily 11)  Tramadol Hcl 50 Mg Tabs (Tramadol hcl) .... Take 1 tablet by mouth three times a day (not to exceed (3) tabs a day) 12)  Lidoderm 5 % Ptch (Lidocaine) .... Apply as direceted... 13)  Meclizine Hcl 25 Mg Tabs (Meclizine hcl) .... Take 1 tab by mouth every 6 h as needed for dizziness 14)  Mirapex 0.5 Mg Tabs (Pramipexole dihydrochloride) .... Take 1-2 tablets by mouth once a day at 4pm 15)  Refresh Dry Eye Therapy 1-1 % Soln (Glycerin-polysorbate 80) .... As needed per package 16)  Alendronate Sodium 70 Mg Tabs (Alendronate sodium) .Marland Kitchen.. 1 by mouth every week 17)  Calcium Carbonate-vitamin D 600-400 Mg-unit Tabs (Calcium carbonate-vitamin d) .... Take 2 tablets at 12pm and 3 tabs at 6 pm 18)  Multivitamins Tabs (Multiple vitamin) .... Take 1 tablet by mouth once a day 19)  Vitamin D3 2000 Unit Caps (Cholecalciferol) .... Take 1 capsule by mouth once a day 20)  Evening Primrose Oil 500 Mg Caps (Evening primrose oil) .... Take one tablet at 7 am and one tablet at 12 pm 21)  Benadryl 25 Mg Tabs  (Diphenhydramine hcl) .... Take one tablet by mouth at bedtime 22)  C Complex Cr-tabs (Bioflavonoid products) .... Take one tablet by mouth once daily 23)  Doxycycline Hyclate 100 Mg Caps (Doxycycline hyclate) .... Take 1 cap by mouth two times a day til gone...  Patient Instructions: 1)  Today we updated your med list- see below.... 2)  Continue your current meds the same... 3)  We wrote a new perscription for Doxycycline antibiotic to take twice daily for 2 weeks.Marland KitchenMarland Kitchen  4)  Use the Saline nasal spray every 1-2H while awake & your Flonase at bedtime.Marland KitchenMarland Kitchen 5)  Call for any questions.Marland KitchenMarland Kitchen 6)  Please schedule a follow-up appointment in 6 months. Prescriptions: DOXYCYCLINE HYCLATE 100 MG CAPS (DOXYCYCLINE HYCLATE) take 1 cap by mouth two times a day til gone...  #28 x 0   Entered and Authorized by:   Michele Mcalpine MD   Signed by:   Michele Mcalpine MD on 12/05/2009   Method used:   Print then Give to Patient   RxID:   [id number]    Immunization History:  Influenza Immunization History:    Influenza:  historical (11/13/2009)  Pneumovax Immunization History:    Pneumovax:  historical (08/30/2003)

## 2010-02-06 NOTE — Letter (Signed)
Summary: [place]   Imported By: Phillis Knack 06/26/2009 11:53:46  _____________________________________________________________________  External Attachment:    Type:   Image     Comment:   External Document

## 2010-02-06 NOTE — Letter (Signed)
Summary: [place]   Imported By: Phillis Knack 12/07/2009 11:48:27  _____________________________________________________________________  External Attachment:    Type:   Image     Comment:   External Document

## 2010-02-06 NOTE — Letter (Signed)
Summary: [facility]  [facility]   Imported By: Lester [place] 06/06/2009 10:36:38  _____________________________________________________________________  External Attachment:    Type:   Image     Comment:   External Document

## 2010-02-06 NOTE — Progress Notes (Signed)
Summary: [facility]ill**[facility]please resend**  Phone Note Refill Request   Refills Requested: Medication #1:  LIPITOR 40 MG TABS take one tablet by mouth at bedtime   Supply Requested: 3 months Please resend to [facility]   Method Requested: Fax to [facility] Initial call taken by: Darnell Level,  April 25, 2009 4:47 PM    Prescriptions: LIPITOR 40 MG TABS (ATORVASTATIN CALCIUM) take one tablet by mouth at bedtime  #90 x 1   Entered by:   Mignon Pine, RMA   Authorized by:   Jolaine Artist, MD, [facility]   Signed by:   Mignon Pine, RMA on 04/26/2009   Method used:   Electronically to        [facility] [address] Probation officer)       [facility]       [address] [place]  [address]       Ph: [phone]       Fax: [phone]   [place]:   [phone]

## 2010-02-06 NOTE — Letter (Signed)
Summary: Ophthalmology/[facility]  Ophthalmology/[facility]   Imported By: Sherian Rein 05/09/2009 13:22:42  _____________________________________________________________________  External Attachment:    Type:   Image     Comment:   External Document

## 2010-02-06 NOTE — Letter (Signed)
Summary: [place]   Imported By: Phillis Knack 02/13/2009 08:20:23  _____________________________________________________________________  External Attachment:    Type:   Image     Comment:   External Document

## 2010-02-06 NOTE — Letter (Signed)
Summary: [place]   Imported By: Phillis Knack 08/31/2009 09:19:53  _____________________________________________________________________  External Attachment:    Type:   Image     Comment:   External Document

## 2010-02-06 NOTE — Letter (Signed)
Summary: Ophthalmology/[facility]  [facility]hthalmology/[facility]   Imported By: Sherian Rein 05/02/2009 08:59:44  _____________________________________________________________________  External Attachment:    Type:   Image     Comment:   External Document

## 2010-02-06 NOTE — Letter (Signed)
Summary: Ophthalmology/[facility]  Ophthalmology/[facility]   Imported By: Sherian Rein 03/29/2009 12:12:23  _____________________________________________________________________  External Attachment:    Type:   Image     Comment:   External Document

## 2010-02-06 NOTE — Letter (Signed)
Summary: Ophthalmology/[facility]  Ophthalmology/[facility]   Imported By: Lester [place] 06/06/2009 10:39:05  _____________________________________________________________________  External Attachment:    Type:   Image     Comment:   External Document

## 2010-02-06 NOTE — Letter (Signed)
Summary: [facility]   Imported By: Sherian Rein 09/12/2009 14:14:38  _____________________________________________________________________  External Attachment:    Type:   Image     Comment:   External Document

## 2010-02-06 NOTE — Letter (Signed)
Summary: [facility]   Imported By: Lester [place] 03/17/2009 10:26:09  _____________________________________________________________________  External Attachment:    Type:   Image     Comment:   External Document

## 2010-02-06 NOTE — Assessment & Plan Note (Signed)
Summary: 6 months/apc   Primary Care Provider:  Dr. Lorin Picket  CC:  8 month ROV & review of mult medical problems....  History of Present Illness: 78 y/o WF here for a follow up visit... she has mult med problems and meds as noted below...    ~  8/09:  she brings in a folder of records to be sure that I am up-to-date on her recent evaluations w/ DrWillis- Neurology, DrBritz- Neurosurg @ [facility], DrBensimhon- Cardiology, DrJHayes- GI, & [facility]... new med list is reviewed... her question page is reviewed w/ pt & husb...  ~  10/09:  Hosp by DrJHayes- GI w/ weakness & ?mass in colon found on CT while hosp in [place]... colonoscopy revealed inflamm & exudate in distal colon ?Crohn's per [facility] and Rx w/ Lialda & Pred (40mg /d).  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 she was also seen by Neuro & given Keppra for ?seizure... she doesn't recall any of the ordeal... since her disch she is much improved and back to baseline mentally & emotionally... she had her meds adjusted while in [place] and her current list reflects these changes.  ~  Jan10:  she is now followed by [facility] for psychiatry... he is adjusting and weaning her meds...   ~  Feb10:  she had a nodule on left lower eyelid checked by DrDigby- sent to [facility], [facility] for Moh's surg, then to Dr. Mickie Kay, [facility] for eyelid reconstruction... 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 they did additional work up including:      ** CT Brain 3/10 was neg- no intracranial lesions, old sinus surg...      ** CT Head & Neck 3/10 showed prev sinus surg, mucosal thickening, no adenopathy or resid tumor.      ** CT Chest 3/10 showed atherosclerotic dis in Ao & coronaries, calcif AoV, left lung apex extra-pleural mass  ~2cm, 4mm LUL nodule, scat ground-glass opac, atelec, marked biliary ductal dil- consider MRCP...  (Old films and CT Chest here 6/07 & 10/09 compared- not mentioned on 2007 scan but the 2009 scan noted lesion left apex 2.6 x 2.0cm <sl larger than 2007 scan in retrospect> ?neurogenic/ nerve sheath tumor).   ~  October 11, 2008:  she has updated list of all medical activities over the last 3mo w/ follow up evals by [facility] (GI), DrGross (Surg- perirectal abscess), DrPlovsky (Psychiatry), [facility] Derm & XRT... also saw the nurse practitioiner in July for UTI (Rx Cipro), TSH too low & Levothy dose derc slightly... today c/o sinus congestion & drainage of "grass green mucous" which improved w/ regular dosing of Claritin, Saline, Mucinex, Flonase... also c/o incr RLS symptoms and we discussed incr the Mirapex Rx... BMD here 10/10 showed osteopenia in the hips> continue Fosamax etc...   ~  June 08, 2009:  alot to get caught up on> she saw [facility] for GI 5/11- doing well on Lialda, Questran & Nexium;  several additional eye operations from DrAFisher at [facility];  f/u eval by ENT at [facility] in [place];  Cardiac f/u DrBensimhon 12/10- doing well, same Rx, f/u 71yr;  etc...  she had a list of questions and minor somatic complaints (bruising, ecchymoses, gas, nasal crusts) which were discussed and dispatched in succession... f/u fasting labs today...   Current Problem List:   ~  ENT Problem>  left nasal soft tissue abscess drained by DrBates- MRSA treated w/ drainage, IV antibiotics, then Doxy...  BRONCHITIS, RECURRENT - last treated in Jan09... she uses Claritin & Flonase for sinuses.  OBSTRUCTIVE SLEEP APNEA  - Hx mild OSA on Sleep Study by DrDohmeir... she never really used the CPAP and states that "now I'm fine off of it".  HYPERTENSION (ICD-401.9) - controlled on COREG 25mg Bid, and off the prev Avalide & Amlodipine... BP=140/72 and pressures are good at home etc...  denies HA, visual changes, CP, palipit, dizziness, dyspnea, edema, etc... she has chronic fatigue symptoms.  CORONARY ARTERY DISEASE  (ICD-414.00) - on COREG 25mg Bid, she stopped her ASA... non-obstructive disease w/ cath 9/06 showing 30% LAD lesion, good LVF... neg Myoview 3/06 no ischemia, EF=73%... she last saw DrBensimhon in Sep09- note reviewed.  Hx of ATRIAL FLUTTER (ICD-427.32) - S/P catheter ablation in 2005 by DrKlein.  CEREBROVASCULAR DISEASE (ICD-437.9) - eval by DrWillis and DrBritz (Neurosurg @ [facility]) confirms 80% left middle cerebral art stenosis which is felt to be asymptomatic... they advise ASA 81mg /d, and secondary risk factor modification...  HYPERLIPIDEMIA (ICD-272.4) - followed in the lipid clinic & off the prev Vytorin 10/80 & Fish Oil... now on LIPITOR 40mg /d + FISH OIL Bid...   ~  FLP 10/08 on Vytor10/80 showed TChol 130, TG 139, HDL 38, LDL 64...  ~  FLP 4/09 on Vytor10/80 showed TChol 138, TG 102, HDL 62, LDL 56...  ~  Vytorin & Fish Oil stopped 11/09 in [place].  ~  Jan10: DrBensimhon started Lipitor 40/d.  ~  FLP 6/10 on Lip40 showed TChol 148, TG 94, HDL 67, LDL 62  ~  FLP 6/11 on Lip40 showed TChol 186, TG 76, HDL 82, LDL 88  OBESITY (ICD-278.00) - she notes that her fibromyalgia makes it difficult for her to exercise.  ~  weight 8/09 = 221#  ~  weight 12/09 = 197#  ~  weight 4/10 = 169#... great job w/ diet program.  ~  weight 6/11 = 173#  GASTROESOPHAGEAL REFLUX DISEASE (ICD-530.81) - followed by DrJHayes for GI on NEXIUM 40Bid... she has GERD, Hx gastric ulcer... DIVERTICULOSIS OF COLON (ICD-562.10) IRRITABLE BOWEL SYNDROME (ICD-564.1) - DrHayes' eval w/ colon reported to show "ulcers" c/w Crohn's dis... COLONIC POLYPS - Hx of divertics w/ diverticulitis in the past and IBS, and hx of Colon Polyps w/ prev colonoscopy in 4/06... see 10/09 hosp by [facility] and f/u colonoscopy w/ ?Crohn's... CROHN'S DISEASE (ICD-555.9) - on LIALDA 1.2gm- 3 tabs daily, QUESTRAN 2 scoops daily, & IMMODIUM Prn... followed by DrJHayes  HYPERPARATHYROIDISM, HX OF (ICD-V12.2) - she had hypercalcemia w/ right  inferior parathyroid gland resected 11/07 by DrGerkin... her Endocrinologist is DrBalan...  HYPOTHYROIDISM (ICD-244.9) - on SYNTHROID daily... prev followed by DrBalan.  ~  labs 6/07 showed TSH= 5.16... managed by DrBalan at that time.  ~  labs here 7/10 on Levothy112 showed TSH= 0.16... rec> decr to 1/2 tab & f/u lab.  ~  labs here 10/10 showed TSH= 10.04... rec> change to 44mcg/d.  ~  labs here 6/11 on Levothy75 showed TSH= 6.84... rec> step up to 45mcg/d.  FIBROMYALGIA (ICD-729.1) - followed by DrDeveshwar... she uses DCN100 & Lidoderm patches for pain.  OSTEOPENIA (ICD-733.90) - on FOSAMAX 70mg /wk, ca++, vits... prev followed by DrBalan...  ~  last BMD 9/08 showed TScores -0.8 in Spine & -2.3 in right FemNeck...  ~  f/u BMD 10/10 showed TScores invalid in Spine, & -2.2 in right FemNeck.Marland KitchenMarland Kitchen  continue Rx.  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  SYNCOPE (ICD-780.2) - after a trip to visit family in [place] she was flying home to [place] and passed out on the plane... she was taken to a hosp outside of [place] and adm for several days...  ~ syncopal spell "altered level of consciusness" she remembers being on the plane and next in the ER...  their DC note indicates poss seizure but nothing to substantiate this (no seiz activ noted by anyone)...   ~  MRI Brain was neg x  max sinusitis... MRA however showed 80% stenosis in the left MCA...  ~  CDoppler showed mild plaque, norm waveforms, no signif stenoses...  ~  2DEcho showed basically WNL, borderline conc LVH, EF=55-60%...  RESTLESS LEG SYNDROME (ICD-333.94) - she's been on MIRAPEX 0.5mg  Qd...  ~  10/10: c/o incr symptoms and instructed to incr Mirapex to 2 tabs...  PSYCHOSIS (ICD-298.9) - this was attributed to Prednisone therapy for the Crohn's dis... hosp in [place] and mult meds discontinued and changed by Psychiatrists... now followed by [facility] and he is weaning her off of the  DEPAKOTE & KEPPRA... & she is off the prev Zoloft & Restoril...   Allergies: 1)  ! Demerol 2)  ! * Dilaudid 3)  ! Morphine 4)  ! Codeine 5)  ! * Fentanyl 6)  ! Prednisone 7)  Prozac (Fluoxetine Hcl) 8)  Qc Bacitracin (Bacitracin) 9)  Tegretol (Carbamazepine) 10)  Ultram (Tramadol Hcl) 11)  Inderal La (Propranolol Hcl) 12)  Indocin (Indomethacin Sodium) 13)  Lisinopril (Lisinopril)  Comments:  Nurse/Medical Assistant: The patient's medications and allergies were reviewed with the patient and were updated in the Medication and Allergy Lists.  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 she uses Claritin & Flonase for sinuses. 3) OBSTRUCTIVE SLEEP APNEA  - Hx mild OSA on Sleep Study by DrDohmeir... she never really used the CPAP 4) HYPERTENSION (ICD-401.9) - controlled on COREG 25mg Bid, and off the prev Avalide & Amlodipine...  5) CORONARY ARTERY DISEASE (ICD-414.00) - on COREG 25mg Bid, she stopped her ASA... non-obstructive disease w/ cath 9/06 showing 30% LAD lesion, good LVF... neg Myoview 3/06 no ischemia, EF=73%... she last saw DrBensimhon in Sep09- note reviewed. 6) Hx of ATRIAL FLUTTER (ICD-427.32) - S/P catheter ablation in 2005 by DrKlein. 7) CEREBROVASCULAR DISEASE (ICD-437.9) - eval by DrWillis and DrBritz (Neurosurg @ [facility]) confirms 80% left middle cerebral art stenosis which is felt to be asymptomatic... they advise ASA 81mg /d, and secondary risk factor modification.Marland KitchenMarland Kitchen 8) HYPERLIPIDEMIA (ICD-272.4) - followed in the lipid clinic & off the prev Vytorin 10/80 & Fish Oil... now on LIPITOR 40mg /d + FISH OIL Bid...   ~  FLP 10/08 on Vytor10/80 showed TChol 130, TG 139, HDL 38, LDL 64...  ~  FLP 4/09 on Vytor10/80 showed TChol 138, TG 102, HDL 62, LDL 56...  ~  Vytorin & Fish Oil stopped 11/09 in [place].  ~  Jan10: DrBensimhon started Lipitor 40/d.  ~  FLP 6/10  showed TChol 148, TG 94, HDL 67, LDL 62 8) Crohn';s disease     --psychosis with prednisone 9) OBESITY (ICD-278.00) - she notes that her fibromyalgia makes it difficult for her to exercise.  ~  weight 8/09 = 221#  ~  weight 12/09 = 197#  ~  weight 4/10 = 169#... great  job w/ diet program. 10) GASTROESOPHAGEAL REFLUX DISEASE (ICD-530.81) - followed by DrJHayes for GI on NEXIUM 40Bid... she has GERD, Hx gastric ulcer... 11) DIVERTICULOSIS OF COLON (ICD-562.10) 12) IRRITABLE BOWEL SYNDROME (ICD-564.1) - DrHayes' eval w/ colon reported to show "ulcers" c/w Crohn's dis... 13) COLONIC POLYPS  Past Surgical History: S/P Parathyroid Adenoma Surgery S/P Cholecystectomy S/P Moh's surg left lower eyelid surg & lid reconstruction surg 2/10  Family History: Reviewed history from 08/12/2007 and no changes required. grandmother hx of colon cancer mother deceased age 59 from bleeding ulcer  hx of HBP and arthritis father deceased age 89 from CHF 1 sibling deceased age 5 from PE 1 sibling alive age 3   1 sibling deceased as an infant from scarlet fever  Social History: Reviewed history from 06/09/2008 and no changes required. quit smoking in 1962  smoked for 5-6 years--discontinued due to dx of tuberculosis.   exposed to second hand smoke exercises 3 times per week no caffeine married 2 children  Review of Systems       The patient complains of decreased hearing, dyspnea on exertion, and difficulty walking.  The patient denies anorexia, fever, weight loss, weight gain, vision loss, hoarseness, chest pain, syncope, peripheral edema, prolonged cough, headaches, hemoptysis, abdominal pain, melena, hematochezia, severe indigestion/heartburn, hematuria, incontinence, muscle weakness, suspicious skin lesions, transient blindness, depression, unusual weight change, abnormal bleeding, enlarged lymph nodes, and angioedema.         She is overall improved...  Vital Signs:  Patient profile:   77  year old female Height:      61 inches Weight:      172.31 pounds BMI:     32.68 O2 Sat:      97 % on Room air Temp:     97.9 degrees F oral Pulse rate:   68 / minute BP sitting:   140 / 72  (right arm) Cuff size:   regular  Vitals Entered By: Randell Loop CMA (June 08, 2009 10:24 AM)  O2 Sat at Rest %:  97 O2 Flow:  Room air CC: 8 month ROV & review of mult medical problems... Is Patient Diabetic? No Pain Assessment Patient in pain? no      Comments meds updated today   Physical Exam  Additional Exam:  WD, Overweight, 77 y/o WF in NAD...  GENERAL:  Alert & oriented; pleasant & cooperative... HEENT:  [place]/AT, EOM-wnl, EACs-clear, TMs-wnl, NOSE-clear, THROAT-clear & wnl. ** s/p mult surg left lower lid- sl red, improved... NECK:  Supple w/ fairROM; no JVD; normal carotid impulses w/o bruits; no thyromegaly or nodules palpated; no lymphadenopathy. CHEST:  Clear to P & A; without wheezes/ rales/ or rhonchi. HEART:  Regular Rhythm; gr 1/6 SEM without rubs/ or gallops. ABDOMEN:  Obese soft & nontender; normal bowel sounds; no organomegaly or masses detected. EXT: without deformities, mild arthritic changes; no varicose veins/ +venous insuffic/ no edema... NEURO:  CN's intact; no focal neuro deficits... DERM:  No lesions noted; no rash etc...    MISC. Report  Procedure date:  06/08/2009  Findings:      BMP (METABOL)   Sodium                    143 mEq/L                   135-145   Potassium            [H]  5.2 mEq/L  3.5-5.1   Chloride                  107 mEq/L                   96-112   Carbon Dioxide            31 mEq/L                    19-32   Glucose                   87 mg/dL                    16-10   BUN                       20 mg/dL                    9-60   Creatinine                0.7 mg/dL                   4.5-4.0   Calcium                   9.0 mg/dL                   9.8-11.9   GFR                       81.32 mL/min                 >60  Hepatic/Liver Function Panel (HEPATIC)   Total Bilirubin           0.8 mg/dL                   1.4-7.8   Direct Bilirubin          0.1 mg/dL                   2.9-5.6   Alkaline Phosphatase      44 U/L                      39-117   AST                       26 U/L                      0-37   ALT                       24 U/L                      0-35   Total Protein             7.1 g/dL                    2.1-3.0   Albumin                   4.1 g/dL                    8.6-5.7  CBC Platelet w/Diff (CBCD)   White Cell Count          5.9  K/uL                    4.5-10.5   Red Cell Count            4.00 Mil/uL                 3.87-5.11   Hemoglobin                13.2 g/dL                   04.5-40.9   Hematocrit                38.0 %                      36.0-46.0   MCV                       94.9 fl                     78.0-100.0   Platelet Count       [L]  137.0 K/uL                  150.0-400.0   Neutrophil %         [L]  41.1 %                      43.0-77.0   Lymphocyte %         [H]  48.2 %                      12.0-46.0   Monocyte %                7.9 %                       3.0-12.0   Eosinophils%              2.1 %                       0.0-5.0   Basophils %               0.7 %                       0.0-3.0  Comments:      Lipid Panel (LIPID)   Cholesterol               186 mg/dL                   8-119   Triglycerides             76.0 mg/dL                  1.4-782.9   HDL                       56.21 mg/dL                 >30.86   LDL Cholesterol           88 mg/dL                    5-78  TSH (TSH)   FastTSH              [  H]  6.84 uIU/mL                 0.35-5.50  Vitamin D (25-Hydroxy) (04540)  Vitamin D (25-Hydroxy)                             55 ng/mL                    30-89   Impression & Recommendations:  Problem # 1:  Hx of BRONCHITIS, RECURRENT (ICD-491.9) Resp status is stable>  no problems...  Problem # 2:  HYPERTENSION (ICD-401.9) BP controlled>   continue same meds. Her updated medication list for this problem includes:    Carvedilol 25 Mg Tabs (Carvedilol) .Marland Kitchen... Take one tablet by mouth two times a day  Orders: TLB-BMP (Basic Metabolic Panel-BMET) (80048-METABOL) TLB-Hepatic/Liver Function Pnl (80076-HEPATIC) TLB-CBC Platelet - w/Differential (85025-CBCD) TLB-Lipid Panel (80061-LIPID) TLB-TSH (Thyroid Stimulating Hormone) (84443-TSH) T-Vitamin D (25-Hydroxy) (98119-14782)  Problem # 3:  CORONARY ARTERY DISEASE (ICD-414.00) Followed by drBensimhon & doing well, no changes made... Her updated medication list for this problem includes:    Aspirin 81 Mg Tbec (Aspirin) .Marland Kitchen... Take 1 tab by mouth once daily...    Carvedilol 25 Mg Tabs (Carvedilol) .Marland Kitchen... Take one tablet by mouth two times a day  Problem # 4:  CEREBROVASCULAR DISEASE (ICD-437.9) She remains on ASA 81mg /d despite the bruising etc...  Problem # 5:  HYPERLIPIDEMIA (ICD-272.4) Well controlled on the Lip40... Her updated medication list for this problem includes:    Lipitor 40 Mg Tabs (Atorvastatin calcium) .Marland Kitchen... Take one tablet by mouth at bedtime    Questran 4 Gm/dose Powd (Cholestyramine) .Marland Kitchen... 2 scoops per day  Problem # 6:  OBESITY (ICD-278.00) We discussed diet + exercise program...  Problem # 7:  CROHN'S DISEASE (ICD-555.9) GI per [facility] and stable on the Lialda, Questran & Nexium...  Problem # 8:  HYPOTHYROIDISM (ICD-244.9) TSH level sl elevated & we will tweek the Synthroid up to 88 when her next refill is due... Her updated medication list for this problem includes:    Synthroid 75 Mcg Tabs (Levothyroxine sodium) .Marland Kitchen... Take 1 tablet by mouth once a day  Problem # 9:  OSTEOPENIA (ICD-733.90) She will continue on the Fosamax, calcium, MVI, Vit D... Her updated medication list for this problem includes:    Alendronate Sodium 70 Mg Tabs (Alendronate sodium) .Marland Kitchen... 1 by mouth every week  Problem # 10:  OTHER MEDICAL PROBLEMS AS NOTED>>>  Complete  Medication List: 1)  Claritin 10 Mg Tabs (Loratadine) .... Take 1 tablet by mouth once a day 2)  Flonase 50 Mcg/act Susp (Fluticasone propionate) .... 2 sprays in each nostirl once daily 3)  Aspirin 81 Mg Tbec (Aspirin) .... Take 1 tab by mouth once daily.Marland KitchenMarland Kitchen 4)  Carvedilol 25 Mg Tabs (Carvedilol) .... Take one tablet by mouth two times a day 5)  Lipitor 40 Mg Tabs (Atorvastatin calcium) .... Take one tablet by mouth at bedtime 6)  Synthroid 75 Mcg Tabs (Levothyroxine sodium) .... Take 1 tablet by mouth once a day 7)  Nexium 40 Mg Cpdr (Esomeprazole magnesium) .... Take 1 capsule by mouth 30 minutes before the first and last meals of the day 8)  Lialda 1.2 Gm Tbec (Mesalamine) .... Take 2 tabs by mouth once daily as directed by drhayes 9)  Questran 4 Gm/dose Powd (Cholestyramine) .... 2 scoops per day 10)  Omega-3 1000 Mg Caps (Omega-3 fatty acids) .Marland KitchenMarland KitchenMarland Kitchen  Take 1 capsule by mouth two times a day 11)  Tramadol Hcl 50 Mg Tabs (Tramadol hcl) .... Take 1 tablet by mouth three times a day (not to exceed (3) tabs a day) 12)  Lidoderm 5 % Ptch (Lidocaine) .... Apply as direceted... 13)  Meclizine Hcl 25 Mg Tabs (Meclizine hcl) .... Take 1 tab by mouth every 6 h as needed for dizziness 14)  Mirapex 0.5 Mg Tabs (Pramipexole dihydrochloride) .... Take 1-2 tablets by mouth once a day at 4pm 15)  Refresh Dry Eye Therapy 1-1 % Soln (Glycerin-polysorbate 80) .... As needed per package 16)  Alendronate Sodium 70 Mg Tabs (Alendronate sodium) .Marland Kitchen.. 1 by mouth every week 17)  Calcium Carbonate-vitamin D 600-400 Mg-unit Tabs (Calcium carbonate-vitamin d) .... Take 1 tablet by mouth two times a day 18)  Multivitamins Tabs (Multiple vitamin) .... Take 1 tablet by mouth once a day 19)  Vitamin D3 2000 Unit Caps (Cholecalciferol) .... Take 1 capsule by mouth once a day  Patient Instructions: 1)  Today we updated your med list- see below.... 2)  Continue your current regimen the same... 3)  Today we did your follow up  FASTING blood work... please call the "phone tree" in a few days for your lab results.Marland KitchenMarland Kitchen 4)  Continue your diet & exercise program- the goal is to lose 10-15 lbs... 5)  For your GAS:  SIMETHACONE is the best anti-gas ingredient... try an upper gas pill, plus a lower gas pill up to 4 times daily for severe gas problems.Marland KitchenMarland Kitchen 6)  Upper GAS= Mylicon, Gas-X, Beano, Mylanta gas etc... 7)  Lower Gas= PHAZYME 125 tabs... 8)  Call for any questions.Marland KitchenMarland Kitchen 9)  Please schedule a follow-up appointment in 6 months.

## 2010-02-06 NOTE — Medication Information (Signed)
Summary: Prescription Refill Form  Prescription Refill Form   Imported By: Sallee Provencal 05/03/2009 15:41:40  _____________________________________________________________________  External Attachment:    Type:   Image     Comment:   External Document

## 2010-02-06 NOTE — Letter (Signed)
Summary: ENT/[facility]  ENT/[facility]   Imported By: Phillis Knack 02/24/2009 14:37:47  _____________________________________________________________________  External Attachment:    Type:   Image     Comment:   External Document

## 2010-02-06 NOTE — Letter (Signed)
Summary: Ophthalmology[facility]  [facility]   Imported By: Lester [place] 09/19/2009 12:21:10  _____________________________________________________________________  External Attachment:    Type:   Image     Comment:   External Document

## 2010-02-06 NOTE — Progress Notes (Signed)
Summary: refill med  Phone Note Refill Request Call back at Home Phone [phone] Message from:  Patient on April 24, 2009 8:05 AM  Refills Requested: Medication #1:  LIPITOR 40 MG TABS take one tablet by mouth at bedtime [facility] [phone]   Method Requested: Fax to [place] Initial call taken by: Neil Crouch,  April 24, 2009 8:06 AM    Prescriptions: LIPITOR 40 MG TABS (ATORVASTATIN CALCIUM) take one tablet by mouth at bedtime  #90 x 1   Entered by:   Margaretmary Bayley CMA   Authorized by:   Jolaine Artist, MD, [facility]   Signed by:   Margaretmary Bayley CMA on 04/24/2009   Method used:   Electronically to        [facility] [facility] (mail-order)       [facility]       [address] [place]  [address]       Ph: [phone]       Fax: [phone]   [place]:   [phone]

## 2010-02-08 NOTE — Letter (Signed)
Summary: Ophthalmology/[facility]  Ophthalmology/[facility]   Imported By: Sherian Rein 01/30/2010 12:36:02  _____________________________________________________________________  External Attachment:    Type:   Image     Comment:   External Document

## 2010-02-08 NOTE — Assessment & Plan Note (Signed)
Summary: f1y   Visit Type:  Follow-up Referring Provider:  Trevionne Advani Primary Provider:  Dr. 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   She returns today for routine followup. Saw Dr. Lenna Gilford last week and was doing well BP well controlled. However he did counsel her on need to lose weight as she has gained 18 pounds recently.  Other than that doing well. Brings in detailed log of BP which I have reviewed. SBP range 100-135 (mean 120s). Denies CP, SOB, edema or palpitations. No problems with meds. Has lost 2-3 pouns since seeing Dr. Lenna Gilford.    Current Medications (verified): 1)  Claritin 10 Mg Tabs (Loratadine) .... Take 1 Tablet By Mouth Once A Day 2)  Flonase 50 Mcg/act Susp (Fluticasone Propionate) .... 2 Sprays in Each Nostirl Once Daily 3)  Aspirin 81 Mg Tbec (Aspirin) .... Take 1 Tab By Mouth Once Daily.Marland KitchenMarland Kitchen 4)  Carvedilol 25 Mg Tabs (Carvedilol) .... Take One Tablet By Mouth Two Times A Day 5)  Lipitor 40 Mg Tabs (Atorvastatin Calcium) .... Take One Tablet By Mouth At Bedtime 6)  Synthroid 88 Mcg Tabs (Levothyroxine Sodium) .... Take One Tablet By Mouth Once Daily 7)  Lialda 1.2 Gm Tbec (Mesalamine) .... Take 3  Tabs By Mouth Once Daily As Directed By Drhayes 8)  Questran 4 Gm/dose Powd (Cholestyramine) .... 2 Scoops Per Day 9)  Omega-3 1000 Mg Caps (Omega-3 Fatty Acids) .... Take 2  Capsule By Mouth Once Daily 10)  Tramadol Hcl 50 Mg Tabs (Tramadol Hcl) .... Take 1 Tablet By Mouth Three Times A Day (Not To Exceed (3) Tabs A Day) 11)  Lidoderm 5 % Ptch (Lidocaine) .... Apply As Direceted... 12)  Meclizine Hcl 25 Mg Tabs (Meclizine Hcl) .... Take 1 Tab By Mouth Every 6 H As Needed For Dizziness 13)  Mirapex 0.5 Mg Tabs (Pramipexole Dihydrochloride) ....  Take 1-2 Tablets By Mouth Once A Day At 4pm 14)  Alendronate Sodium 70 Mg Tabs (Alendronate Sodium) .Marland Kitchen.. 1 By Mouth Every Week 15)  Calcium Carbonate-Vitamin D 600-400 Mg-Unit  Tabs (Calcium Carbonate-Vitamin D) .... Take 2 Tablets At 12pm and 3 Tabs At 6 Pm 16)  Multivitamins   Tabs (Multiple Vitamin) .... Take 1 Tablet By Mouth Once A Day 17)  Vitamin D3 2000 Unit Caps (Cholecalciferol) .... Take 1 Capsule By Mouth Once A Day 18)  Evening Primrose Oil 500 Mg Caps (Evening Primrose Oil) .... Take One Tablet At 7 Am and One Tablet At 12 Pm 19)  Benadryl 25 Mg Tabs (Diphenhydramine Hcl) .... Take One Tablet By Mouth At Bedtime 20)  C Complex  Cr-Tabs (Bioflavonoid Products) .... Take One Tablet By Mouth Once Daily 21)  Doxycycline Hyclate 100 Mg Caps (Doxycycline Hyclate) .... Take 1 Cap By Mouth Two Times A Day Til Gone... 22)  Guaifenesin 400 Mg Tabs (Guaifenesin) .... Three Times A Day 23)  Phazyme 180 Mg Caps (Simethicone) .... Three Times A Day 24)  Thera-Fresh Eye Gel .... As Needed  Allergies (verified): 1)  ! Demerol 2)  ! * Dilaudid 3)  ! Morphine 4)  ! Codeine 5)  ! * Fentanyl 6)  ! Prednisone 7)  ! * Refresh Eye Drops 8)  Prozac (Fluoxetine Hcl) 9)  Qc Bacitracin (Bacitracin) 10)  Tegretol (Carbamazepine) 11)  Ultram (Tramadol  Hcl) 12)  Inderal La (Propranolol Hcl) 13)  Indocin (Indomethacin Sodium) 14)  Lisinopril (Lisinopril)  Past History:  Past Medical History: Last updated: 12/05/2009 MERKEL CELL TUMOR (ICD-239.7) OBSTRUCTIVE SLEEP APNEA (ICD-327.23) Hx of BRONCHITIS, RECURRENT (ICD-491.9) OTHER DISEASES OF LUNG NOT ELSEWHERE CLASSIFIED (ICD-518.89) HYPERTENSION (ICD-401.9) CORONARY ARTERY DISEASE (ICD-414.00) Hx of ATRIAL FLUTTER (ICD-427.32) CEREBROVASCULAR DISEASE (ICD-437.9) HYPERLIPIDEMIA (ICD-272.4) OBESITY (ICD-278.00) GASTROESOPHAGEAL REFLUX DISEASE (ICD-530.81) DIVERTICULOSIS OF COLON (ICD-562.10) IRRITABLE BOWEL SYNDROME (ICD-564.1) COLONIC  POLYPS (ICD-211.3) CROHN'S DISEASE (ICD-555.9) HYPOTHYROIDISM (ICD-244.9) HYPERPARATHYROIDISM, HX OF (ICD-V12.2) FIBROMYALGIA (ICD-729.1) OSTEOPENIA (ICD-733.90) Hx of HEADACHE, MIXED (ICD-784.0) SYNCOPE (ICD-780.2) RESTLESS LEG SYNDROME (ICD-333.94) PSYCHOSIS (ICD-298.9)  Review of Systems       As per HPI and past medical history; otherwise all systems negative.   Vital Signs:  Patient profile:   77 year old female Height:      61 inches Weight:      188 pounds BMI:     35.65 Pulse rate:   68 / minute BP sitting:   140 / 72  (left arm) Cuff size:   regular  Vitals Entered By: Mignon Pine, RMA (December 15, 2009 10:27 AM)  Physical Exam  General:  well appearing. no resp difficulty HEENT: normal except for some erythema around L eye Neck: supple. no JVD. Carotids 2+ bilat; no bruits. No lymphadenopathy or thryomegaly appreciated. Cor: PMI nondisplaced. Regular rate & rhythm. No rubs, gallops, murmur. Lungs: clear Abdomen: obese soft, nontender, nondistended. Good bowel sounds. Extremities: no cyanosis, clubbing, rash, edema Neuro: alert & orientedx3, cranial nerves grossly intact. moves all 4 extremities w/o difficulty. affect pleasant    Impression & Recommendations:  Problem # 1:  HYPERTENSION (ICD-401.9) Blood pressure well controlled. Continue current regimen.  Problem # 2:  Hx of ATRIAL FLUTTER (ICD-427.32) Quiescent s/p ablation   No further work-up needed.   Other Orders: EKG w/ Interpretation (93000)

## 2010-02-09 NOTE — Letter (Signed)
Summary: Ophthalmology/[facility]  Ophthalmology/[facility]   Imported By: Sherian Rein 05/22/2009 11:49:37  _____________________________________________________________________  External Attachment:    Type:   Image     Comment:   External Document

## 2010-02-09 NOTE — Letter (Signed)
Summary: [facility]   Imported By: Phillis Knack 04/11/2009 15:20:45  _____________________________________________________________________  External Attachment:    Type:   Image     Comment:   External Document

## 2010-02-13 ENCOUNTER — Encounter: Payer: Self-pay | Admitting: Pulmonary Disease

## 2010-03-15 NOTE — Letter (Signed)
Summary: ENT/[facility]  ENT/[facility]   Imported By: Lester Chaffee 03/06/2010 09:32:05  _____________________________________________________________________  External Attachment:    Type:   Image     Comment:   External Document

## 2010-04-12 LAB — BASIC METABOLIC PANEL
BUN: 11 mg/dL (ref 6–23)
CO2: 25 mEq/L (ref 19–32)
Chloride: 98 mEq/L (ref 96–112)
Creatinine, Ser: 0.69 mg/dL (ref 0.4–1.2)
Glucose, Bld: 89 mg/dL (ref 70–99)

## 2010-04-18 ENCOUNTER — Other Ambulatory Visit: Payer: Self-pay | Admitting: Internal Medicine

## 2010-05-22 NOTE — Assessment & Plan Note (Signed)
 HEALTHCARE                             PULMONARY OFFICE NOTE   NAME:SNOWHarla, Mensch                          MRN:          [id number]  DATE:10/24/2006                            DOB:          1932-07-11    HISTORY OF PRESENT ILLNESS:  The patient is a 77 year old white female  patient of Dr. Lenna Gilford who has a known history of hypertension,  hyperlipidemia, obstructive sleep apnea, and fibromyalgia.  She  presented for a one-week history of nasal congestion, postnasal drip,  sore throat, hoarseness, and cough.  The patient denies any chest pain,  orthopnea, PND, recent travel, or antibiotic use.   PAST MEDICAL HISTORY:  Reviewed.   CURRENT MEDICATIONS:  Reviewed.   PHYSICAL EXAMINATION:  GENERAL:  The patient is a pleasant female in no  acute distress.  VITAL SIGNS:  She is afebrile with stable vital signs.  O2 saturation  95% on room air.  HEENT:  Nasal mucosa has mild erythema. Normocephalic and atraumatic.  Posterior pharynx is clear.  NECK:  Supple without cervical adenopathy.  No JVD.  CHEST:  Lung sounds reveal coarse breath sound without wheeze or  crackles.  CARDIOVASCULAR:  Regular rate.  ABDOMEN:  Soft and nontender.  EXTREMITIES: Warm without edema.   IMPRESSION AND PLAN:  Acute upper respiratory exacerbation.  Begin  Augmentin x7 days, Mucinex DM, Tussionex 4 ounces 1/2 to 1 teaspoon  every 12 hours as needed for cough.  The patient will return back to Dr.  Lenna Gilford as scheduled or sooner as needed.      Rexene Edison, NP  Electronically Signed      Deborra Medina. Lenna Gilford, MD  Electronically Signed   TP/MedQ  DD: 10/28/2006  DT: 10/29/2006  Job #: [id number]

## 2010-05-22 NOTE — Assessment & Plan Note (Signed)
[facility]                             PULMONARY OFFICE NOTE   NAME:[name], [name]                          MRN:          [id number]  DATE:06/30/2006                            DOB:          07-31-1932    HISTORY OF PRESENT ILLNESS:  The patient is a 77 year old white female  patient of Dr. Lenna Gilford who has a known history of hypertension,  hyperlipidemia, obstructive sleep apnea and fibromyalgia presents today  complaining of a 4 day history of cough, congestion, nasal stuffiness  and fatigue.  The patient has not used any over-the-counter products for  treatment.  She denies any hemoptysis, orthopnea, PND or leg swelling.   PAST MEDICAL HISTORY:  Is reviewed.   CURRENT MEDICATIONS:  Reviewed.   PHYSICAL EXAMINATION:  The patient is a pleasant female in no acute  distress.  She is afebrile and stable vital signs.  O2 saturation is 94% on room  air.  HEENT:  Nasal mucosa is slightly pale.  Nontender sinuses.  Posterior  pharynx is clear.  NECK:  Supple without cervical adenopathy.  No JVD.  Lung sounds are clear.  CARDIAC:  Regular rate.  ABDOMEN:  Soft, nontender.  EXTREMITIES:  Warm without any edema.   IMPRESSION AND PLAN:  Acute upper respiratory infection.  Patient to  begin Omnicef x7 days.  Mucinex DM twice daily. The patient is to return  back with Dr. Lenna Gilford as scheduled or sooner if needed.      Rexene Edison, NP  Electronically Signed      Deborra Medina. Lenna Gilford, MD  Electronically Signed   TP/MedQ  DD: 07/02/2006  DT: 07/02/2006  Job #: [id number]

## 2010-05-22 NOTE — Consult Note (Signed)
[name]                   ACCOUNT NO.:  [id number]   MEDICAL RECORD NO.:  [id number]          PATIENT TYPE:  INP   LOCATION:  58                         FACILITY:  [facility]   PHYSICIAN:  Irine Seal, MD    DATE OF BIRTH:  May 31, 1932   DATE OF CONSULTATION:  DATE OF DISCHARGE:                                 CONSULTATION   REFERRING PHYSICIAN:  Dr. Amedeo Plenty of [facility].   REASON FOR CONSULTATION:  Medical management of chronic medical issues,  as well as orthostasis, dizziness and visual disturbance.   PRIMARY CARE PHYSICIAN:  Dr. Teressa Lower of [facility].   CARDIOLOGIST:  Dr. Haroldine Laws.   HISTORY OF PRESENT ILLNESS:  [name] is a pleasant 77 year old female  with multiple medical problems, including nonobstructive coronary artery  disease, hyperthyroidism, status post I-131 ablation, now on  levothyroxine, history of a TIA, history of A flutter, status post  ablation in 2005 per Dr. Caryl Comes, obstructive sleep apnea, history of 80%  middle cerebral artery stenosis, who had recently returned from a trip  to [place] and was following up at the gastroenterologist's office for  further evaluation of a colonic mass found on CT scan in [place] when  the patient had the sudden onset of visual disturbance, dizziness,  diaphoresis, thick speech and found to be hypotensive with blood  pressure of 78/48.  The patient denied any syncope.  No chest pain, no  shortness of breath, no slurred speech, no facial asymmetry, no  asymmetric weakness or numbness, no focal neurological symptoms.  No  fever, no chills.  No nausea, no vomiting, no abdominal pain.  No  melena, no hematemesis, no hematochezia, no cough.  The patient,  however, does endorse copious amounts of diarrhea since October 09, 2007, when she was in [place] up until October 18, 2007, with recent  worsening of her diarrhea over the past 2 days, prior to admission  having approximately 10 episodes of diarrhea.   The patient has also  endorsed decreased p.o. intake as she has been on a clear liquid diet  over the past few days and also taking her antihypertensive medications.  911 was called at her gastroenterologist's office.  EMS came.  The  patient was placed on the EMS truck.  The patient was given IV fluids  and the patient's pressure responded adequately to the IV fluids.  We  are called to consult for the patient's orthostasis, visual disturbance  and dizziness, as well as a chronic medical issues.   ALLERGIES:  1. IBUPROFEN.  2. TAPE.  3. DEMEROL.  4. DILAUDID.  5. MORPHINE.  6. CODEINE.  7. FENTANYL.  8. BACITRACIN.  9. INDERAL.  10.PROZAC.  11.ULTRAM.  12.TEGRETOL.  13.REQUIP.  14.LYRICA.  15.FENTANYL PATCH.   PAST MEDICAL HISTORY:  1. Hypertension.  2. Hyperlipidemia.  3. Morbid obesity.  4. Coronary artery disease.  5. A flutter, status post ablation  6. Middle cerebral artery stenosis of approximately 80%.  7. Primary hyperparathyroidism.  8. Lumbar stenosis.  9. Seizure disorder.  10.Migraines.  11.Fibromyalgia.  12.Vertigo.  13.Obstructive sleep apnea.  14.History of TIA.  15.History of TB.  16.Gastroesophageal reflux disease.  17.Status post parathyroidectomy.  18.Status post appendectomy.  19.Status post cholecystectomy.  20.Status post right rotator cuff repair.  21.Status post back surgery.  22.Status post tonsillectomy and adenoidectomy.  23.History of benign breast biopsies and D&C for uterine cyst.  24.Multiple orthopedic surgeries.  25.Degenerative joint disease.  26.Chronic back pain.   HOME MEDICATIONS:  1. Effexor 75 mg p.o. daily.  2. Librax 2 capsules up to three times daily.  3. Carvedilol 25 mg p.o. b.i.d.  4. Avalide 150/12.5 mg p.o. daily.  5. Amlodipine 5 mg p.o. daily.  6. Gabapentin 300 mg b.i.d. and 600 mg nightly.  7. Claritin 10 mg p.o. daily.  8. Flonase 50 mcg 2 sprays to each nostril nightly.  9. Saline nasal sinus rinse  q.a.m. and p.m.  10.Levothyroxine 0.125 mg p.o. daily.  11.Nexium 40 mg p.o. b.i.d.  12.Aspirin 81 mg p.o. daily.  13.Mirapex 0.25 mg p.o. daily.  14.Vytorin 10/80 p.o. daily.  15.Optive eye drops to both eyes b.i.d. p.r.n.  16.Meclizine 25 mg p.o. p.r.n.  17.Darvocet N 100 1 tablet p.o. q.4-6 hours p.r.n.  18.Lidoderm patches p.r.n. for back pain.  19.Multivitamin p.o. daily.  20.Complex C bioflavonoids 100 mg p.o. daily.  21.Omega-3 1000 mg b.i.d.  22.Guaifenesin 600 mg b.i.d. p.r.n.  23.Vitamin D 1000 mg p.o. daily  24.Glucosamine chondroitin p.o. t.i.d.  25.Calcium/mag 500 mg p.o. t.i.d.   SOCIAL HISTORY:  The patient is married, lives in [place], [place].  Denies any alcohol use.  No tobacco use.  Had a remote  tobacco history, quit in the 109s.  No IV drug use.  The patient has two  children, all of whom are healthy.   FAMILY HISTORY:  Father deceased at age 35 from congestive heart  failure.  Mother had a history of CVA and deceased at age 83 from a  bleeding ulcer.  Sister deceased at age 63 from a pulmonary embolism.  Maternal mother deceased age 34 from colon cancer.   REVIEW OF SYSTEMS:  As per HPI, otherwise negative.   PHYSICAL EXAMINATION:  VITAL SIGNS:  Temperature 98.7, pulse of 78,  blood pressure 112/44, respiratory rate 16, sating 100% on 2 liters  nasal cannula.  I/O 549.  GENERAL:  The patient is a pleasant female in no apparent distress.  HEENT:  Normocephalic, atraumatic.  Pupils equal, round and reactive to  light and accommodation.  Extraocular movements intact.  Oropharynx is  clear.  No lesions.  Dry mucous membranes.  NECK:  Supple.  No lymphadenopathy.  RESPIRATORY:  Lungs are clear to auscultation bilaterally.  No crackles,  no wheezes, no rhonchi.  CARDIOVASCULAR:  Regular rate and rhythm.  No  murmurs, rubs or gallops.  ABDOMEN:  Soft, nontender, nondistended.  Positive bowel sounds.  EXTREMITIES:  No clubbing, cyanosis or edema.   NEUROLOGICAL:  The patient is alert and oriented x3.  Cranial nerves II-  XII are grossly intact.  No focal deficits.  She has 5/5 bilateral upper  extremity strength, 5/5 bilateral lower extremity strength.  Cerebellum  is intact.  Sensation is intact.  Visual fields are intact.  Unable to  elicit reflexes diffusely and symmetrically.  Gait not tested secondary  to safety.   LABORATORY DATA:  Sodium 131, potassium 4.1, chloride 94, bicarb 27, BUN  11, creatinine 1.36, glucose of 110.  CBC - white count 8.6, hemoglobin  12.4, platelets 169, hematocrit 36.7.  ASSESSMENT AND PLAN:  1. Orthostasis/visual disturbance/dizziness/hypotension, likely      secondary to volume depletion.  The patient with a 9-day history of      multiple episodes of diarrhea, decreased p.o. intake on current      antihypertensive medication use, including diuretics and found to      be hypotensive at her gastroenterologist's office, leading likely      to a cerebral hypoperfusion.  The patient denied any cardiogenic      symptoms.  No focal neurological findings.  The patient is      afebrile.  No signs or symptoms of infection.  The patient      responded well to first bolus of IV fluids given per EMS.  Will      hydrate with IV fluids.  We will check cardiac enzymes q.8 h., x3.      Check a head CT without contrast.  Check a TSH.  Check a UA with      cultures and sensitivities.  Check a random cortisol level.  Check      a chest x-ray.  Will hold antihypertensive medications for now.      Hydrate with IV fluids and follow.  The patient is likely to have a      colonoscopy tomorrow for further evaluation of this colonic mass,      and as such will keep on IV fluids as the patient will be prepped      with Golytely prior to her procedure.  2. Acute renal failure, likely secondary to prerenal azotemia      secondary to problem #1 in the setting of antihypertensive      medication use.  We will check a  fractional excretion of sodium.      Will place on IV fluids and follow renal function.  If no      improvement in renal function, will check a renal ultrasound to      rule out hydronephrosis.  3. Hypertension.  Will hold blood pressure medications secondary to      problem #1.  4. Hyperlipidemia.  Continue Vytorin  5. Ascending colonic mass, per primary team.  6. Coronary artery disease.  Will hold beta-blocker and Avalide      secondary to problem #1.  Will continue the patient's statin once      the patient becomes euvolemic and is volume resuscitated.  Will      restart the patient's beta-blocker.  7. Atrial flutter, status post ablation.  8. History of middle cerebral artery 80% stenosis, stable.  Continue      aspirin.  9. Obstructive sleep apnea.  CPAP nightly.  10.History of transient ischemic attack.  11.Primary hyperparathyroidism.  12.Hypothyroidism.  Check a TSH, continue home dose Synthroid.  13.Gastroesophageal reflux disease.  Protonix.  14.Prophylaxis.  Protonix for gastrointestinal prophylaxis.  Lovenox      for deep venous thrombosis prophylaxis.   It has been a pleasure consulting and taking care of [name].      Irine Seal, MD  Electronically Signed     DT/MEDQ  D:  10/19/2007  T:  10/19/2007  Job:  [id number]   cc:   Deborra Medina. Lenna Gilford, [address]  [place] [address]   [facility], [place] [address] [place] [address]   [place] Amedeo Plenty, M.D.  Fax: [phone]

## 2010-05-22 NOTE — Assessment & Plan Note (Signed)
HEALTHCARE                            CARDIOLOGY OFFICE NOTE   NAME:Funderburg, FRANCETTA ILG                          MRN:          [id number]  DATE:08/25/2006                            DOB:          [birth year]    PRIMARY CARE PHYSICIAN:  Dr. Teressa Lower.   INTERVAL HISTORY:  [name] is a very pleasant 77 year old woman with a  history of morbid obesity, hypertension and hyperlipidemia, sleep apnea  and atrial flutter status post previous catheter ablation who returns  today for routine followup of her blood pressure.   At the last visit we stopped her Diovan as it was giving her a headache  and we switched her Atenolol over to Coreg.  She has titrated this up  over the past 2 months.  She says her blood pressure is still elevated  with average systolics probably in the 130 range.  She also notes that  she has been having fatigue.  She does have snoring which is a little  bit more than previous.  In the past she was on CPAP but stopped.  She  had a sleep study with Dr. Brett Fairy in Neurology last year and was told  that her sleep apnea was not a major problem, it had more to do with her  chronic back pain.   She denies any chest pain, shortness of breath, no lower extremity  edema.  She is not exercising.  She does have an exercise bike but says  she is not using it.   MEDICATIONS:  1. Effexor 75 a day.  2. Neurontin 300 mg in the morning and at noon and 600 mg at night.  3. Claritin.  4. Astelin spray.  5. Synthroid 137 mcg a day.  6. Nexium 40 a day.  7. Vytorin 10/80.  8. Aspirin 81.  9. Sular 10.  10.Fosamax.  11.Mirapex.  12.Coreg 18.75 b.i.d.   PHYSICAL EXAMINATION:  She ambulates around the clinic without any  respiratory difficulty.  She is in no acute distress.  Blood pressure is  initially 144/66, manual recheck 152/78, pulse is 71, her weight is 211.  HEENT:  Normal.  NECK:  Thick and supple, no obvious JVD, carotids are 2+  bilaterally  without any bruits, there is no lymphadenopathy or thyromegaly.  CARDIAC:  PMI is not palpable, she has a regular rate and rhythm, no  murmurs, rubs there is a soft S4.  LUNGS:  Clear.  ABDOMEN:  Obese, nontender, nondistended, no hepatosplenomegaly  appreciated, no bruits, no masses, good bowel sounds.  EXTREMITIES:  Warm with no cyanosis, clubbing, or edema.  She has good  distal pulses.  NEURO:  Alert and oriented x3, cranial nerves II through XII are intact,  moves all 4 extremities without difficulty.  Affect is pleasant.   ASSESSMENT/PLAN:  Hypertension, this remains suboptimally controlled.  We will add lisinopril HCTZ 20/12.5 and see how she does.  We will check  a BMET in one week to make sure her potassium and renal function are  stable.  I did tell her to pay close  attention to her snoring patterns.  It may be worth repeating her sleep study at some time as she is really  set up to have severe sleep apnea.   DISPOSITION:  We will see her back in several weeks for routine followup  of her blood pressure.  I also recommended that she do her best to  participate in a routine exercise program with her stationary bike and  try to lose some weight.     Shaune Pascal. Bensimhon, MD  Electronically Signed    DRB/MedQ  DD: 08/25/2006  DT: 08/25/2006  Job #: [id number]   cc:   Deborra Medina. Lenna Gilford, MD

## 2010-05-22 NOTE — Assessment & Plan Note (Signed)
[facility]                            CARDIOLOGY OFFICE NOTE   NAME:[name], [name]                          MRN:          [id number]  DATE:05/16/2006                            DOB:          20-Oct-1932    PRIMARY CARE PHYSICIAN:  Dr. Teressa Lower.   PRIMARY CARDIOLOGIST:  Dr. Haroldine Laws.   HISTORY OF PRESENT ILLNESS:  [name] is a 77 year old female patient  followed by Dr. Haroldine Laws with a history of atrial flutter status post  ablation in 2005 and nonobstructive coronary artery disease with minimal  plaquing by catheterization in September 2006, as well as hypertension,  who returns to the office today for followup on blood pressure.  She has  recently had some changes made in her medications for her restless leg  syndrome.  She had some significant side effects to Requip, and this was  discontinued and her Mirapex dose was doubled.  She developed some neck  tightness on the right and arm pain with this, and her blood pressure  went up.  Her dose was decreased after 1 day and then she was tried on  Lyrica, which also caused significant side effects, and this was  discontinued.  Since that time that her Mirapex was increased, she has  noticed that her blood pressures have run higher.  It has been as high  as 159/107.  She denies any changes in her diet.  She denies any  increase in salt intake.  There have really been no other medication  changes, other than the fact that she was placed on fossa back in  November after parathyroid surgery.  She denies headaches or blurry  vision.  She denies any chest discomfort or shortness of breath.  Denies  any syncope.  Denies any tachy palpitations reminiscent of her previous  atrial flutter.  She does sleep on 2 pillows secondary to acid reflux  disease.  Denies paroxysmal nocturnal dyspnea.   CURRENT MEDICATIONS:  1. Effexor 75 mg a day.  2. Neurontin 300 mg in the morning and 600 mg in the evening.  3.  Claritin 10 mg a day.  4. Astelin spray.  5. Levothyroxine 137 mcg daily.  6. Nexium 40 mg daily.  7. Vytorin 10/80 mg daily.  8. Mirapex 0.5 mg daily.  9. Aspirin 81 mg daily.  10.Sular 10 mg daily.  11.Atenolol 50 mg daily.  12.Fosamax 10 mg weekly.  13.Optivar eye drops.  14.Multivitamin.  15.Vitamin C.  16.Meclizine p.r.n.  17.Darvocet p.r.n.  18.Lidoderm patch p.r.n.  19.Psyllium.  20.Omega 3.  21.Glucosamine.  22.Guaifenesin.  23.Vitamin D.  24.Calcium.   ALLERGIES:  Extensive and listed in the chart.   PHYSICAL EXAM:  She is a well-developed, well-nourished female in no  acute distress.  Blood pressure is 149/77, pulse 74, weight 207 pounds.  HEENT:  Unremarkable.  NECK:  Without JVD.  CARDIAC:  Normal S1, S2.  Regular rate and rhythm without murmurs.  LUNGS:  Clear to auscultation bilaterally with no wheeze, rales, or  rhonchi.  ABDOMEN:  Soft and nontender with normoactive bowel  sounds.  No  organomegaly.  EXTREMITIES:  Without edema.  Calves soft and nontender.  SKIN:  She does have a petechial-appearing rash to her bilateral lower  extremities anteriorly.  She denies pruritus.   ELECTROCARDIOGRAM:  Sinus rhythm, heart rate of 74.  Normal axis.  No  acute changes.   IMPRESSION:  1. Hypertension.  2. History of atrial flutter status post ablation 2005 by Dr. Caryl Comes.  3. Treated dyslipidemia.  4. Morbid obesity.  5. History of transient ischemic attacks.  6. Sleep apnea.  7. Chronic low back pain. status post surgery.  8. Nonobstructive coronary artery disease with minimal plaquing by      catheterization in September 2006.  9. Gastroesophageal reflux disease with previous antral ulcers and      gall stones.  10.Hyperthyroidism status post I131 ablation of the thyroid.  11.History of hyperparathyroidism status post recent surgery.  12.History of bronchiectasis.  13.Rash, bilateral lower extremities.   PLAN:  The patient presents to the office today  for followup on her  blood pressure.  It has been running higher since she had changes in her  medications made.  She has really had no change in her symptoms from her  restless leg syndrome.  There has really been no change in her diet.  She has been on the above medications for her blood pressure for quite  some time now.  At this point in time, I think we should adjust her  medications for better blood pressure control.  Therefore, I have added  Diovan 80 mg a day.  We check a BMET today as well as a BMET in a week  to followup on her renal function and potassium.  I have chosen ARB  instead of an ACE inhibitor given her history of bronchiectasis.  I  wanted to avoid the possibility of creating cough.  She is okay with  this.  We will also check a CBC given the rash on her lower extremities.  She is due to see dermatology next week.  As long as her platelets are  normal, she should followup  further with dermatology.  I will bring her back in followup with Dr.  Haroldine Laws in 4 weeks.  She knows to follow up sooner should she have any  worsening blood pressure readings.      Richardson Dopp, PA-C       Minus Breeding, MD, [facility]    SW/MedQ  DD: 05/16/2006  DT: 05/16/2006  Job #: [id number]

## 2010-05-22 NOTE — Assessment & Plan Note (Signed)
[facility] NOTE   NAME:Requena, FRAYDA EGLEY                          MRN:          [id number]  DATE:10/30/2006                            DOB:          [birth year]    The patient comes in today for follow-up of her hyperlipidemia therapy  which includes Vytorin 10/80 one daily and Omega-3 fatty acids 1 gram  twice daily.  She has been compliant with this therapy and has been  tolerating it just fine with no new muscle aches and pains.   MEDICATIONS:  Quite extensive list is up-to-date in her chart.   She is recently back from trips out of state.  Prior to that she was  having some problems with muscle spasms in her back and has been going  to physical therapy with some good results.  She has continued to ride  the stationary bike when she can.  She continues to try to stay on a  heart healthy diet especially with her type 2 diabetes as well.   PHYSICAL EXAMINATION:  VITAL SIGNS:  Weight 211, blood pressure 140/50,  heart rate 74.   LABORATORY DATA:  Total cholesterol 130, triglycerides 139, HDL 37.8,  LDL 64, AST slightly elevated at 60, ALT is slightly elevated at 54.   ASSESSMENT:  Triglycerides and LDL are at goal.  HDL is down slightly  from last time and is not at goal of greater than 45.  Her liver  function tests are slightly elevated, but they are stable.   PLAN:  We are going to continue the same medications for now.  I  encouraged her to continue with her heart healthy diet and increase  exercise as much as possible to try and get that HDL back up to goal.  We are going to follow up with repeat lipid panel in six months.  She  was encouraged to call us in the meantime with any questions or  concerns.      Romeo Rabon, PharmD  Electronically Signed      Minus Breeding, MD, [facility]  Electronically Signed   TP/MedQ  DD: 10/30/2006  DT: 10/31/2006  Job #: [id number]

## 2010-05-22 NOTE — Op Note (Signed)
[name], [name]                   ACCOUNT NO.:  [id number]   MEDICAL RECORD NO.:  [id number]          PATIENT TYPE:  INP   LOCATION:  1438                         FACILITY:  [facility]   PHYSICIAN:  John C. Amedeo Plenty, M.D.    DATE OF BIRTH:  03/17/1932   DATE OF PROCEDURE:  10/19/2007  DATE OF DISCHARGE:                               OPERATIVE REPORT   PROCEDURE:  Colonoscopy with biopsy.   INDICATIONS FOR PROCEDURE:  Right lower quadrant abdominal pain with  thickening or mass in the ascending colon on CT scan.   PROCEDURE:  The patient was placed in the left lateral decubitus  position and placed on the pulse monitor with continuous low-flow oxygen  delivered by nasal cannula.  She was sedated with 125 mcg IV fentanyl,  12 mg IV Versed, 12.5 mg IV Phenergan.  The Olympus video colonoscope  was inserted into the rectum and advanced to the cecum, confirmed by  transillumination of McBurney's point and visualization of ileocecal  valve and appendiceal orifice.  The prep was good.  The terminal ileum  and cecum were normal.  Within the ascending colon there was an area of  circumferential thickening with diffuse exudate and erythema and  confluent ulceration most consistent with Crohn's disease although  ischemia could not be ruled out.  There were no obviously suspicious  areas for neoplasm.  Multiple biopsies were taken.  There is an abrupt  transition in the distally ascending colon with normal mucosa.  The  normal mucosa persisted throughout the transverse, descending colon.  In  the sigmoid and rectosigmoid there were some areas of the flat erythema  without any definite edema or granularity overlying the ridges of the  colon.  This appeared very nonspecific although it might represent a  mild inflammatory process.  I did not any additional biopsies there.  The distal rectosigmoid and rectum appeared normal.  Scope was then  withdrawn and the patient returned to the recovery room in  stable  condition.  She tolerated procedure well.  There were no immediate  complications.   IMPRESSION:  Marked inflammation of the segment of ascending colon, most  consistent by appearance of Crohn's disease with possible minimal  superficial inflammation of rectosigmoid and sigmoid.   PLAN:  Await biopsy results.  Will resume Lialda which she has been on  in the past for treatment of aphthous ulcers earlier and will also  initiate prednisone while awaiting biopsy results.           ______________________________  Elyse Jarvis Amedeo Plenty, M.D.     JCH/MEDQ  D:  10/21/2007  T:  10/21/2007  Job:  [id number]   cc:   Deborra Medina. Lenna Gilford, [place]  [place] [address]

## 2010-05-22 NOTE — Assessment & Plan Note (Signed)
[facility]                            CARDIOLOGY OFFICE NOTE   NAME:[name], [name]                          MRN:          [id number]  DATE:02/11/2008                            DOB:          [birth year]    PRIMARY CARE PHYSICIAN:  Deborra Medina. Lenna Gilford, MD   INTERVAL HISTORY:  Kaly is a 77 year old woman with multiple medical  problems including morbid obesity, hypertension, hyperlipidemia,  borderline sleep apnea, atrial flutter status post previous ablation who  returns today for routine followup.   Unfortunately, she was recently diagnosed with Crohn disease.  She was  placed on prednisone and had significant delirium and even a seizure for  which she was started on anticonvulsive medications.  She was  subsequently discharged.  She has been doing somewhat better.  From a  cardiac point-of-view, she is doing okay.  She has not had any further  episodes of syncope.  She denies any chest pain or dyspnea.  Blood  pressure has been well controlled.  She has not had her cholesterol  checked recently.   CURRENT MEDICATIONS:  1. Carvedilol 25 b.i.d.  2. Nexium 40 a day.  3. Mirapex.  4. Aspirin 81 a day.  5. Restoril 15 at night.  6. Divalproex 1000 mg a night.  7. Keppra 500 mg b.i.d.  8. Synthroid 112 mcg a day.  9. Lialda 1.2 g 3 tablets daily.  10.Calcium.  11.Vitamin D.  12.Omega 3 fatty acids.   PHYSICAL EXAMINATION:  GENERAL:  She is in no acute distress, ambulates  around the clinic without any respiratory difficulty.  VITAL SIGNS:  Blood pressure is 118/54, heart rate is 72, weight is 183  which is down 35 pounds from previous.  HEENT:  Normal except for a recently diagnosed basal cell carcinoma in  her left lower eyelid.  NECK:  Supple.  No JVD.  Carotids are 2+ bilaterally without bruits.  There is no lymphadenopathy or thyromegaly.  CARDIAC:  PMI is nonpalpable.  Regular rate and rhythm.  No murmurs,  rubs, or gallops.  LUNGS:  Clear.  ABDOMEN:  Obese, nontender, and nondistended.  No bruits or masses  noted.  EXTREMITIES:  Warm with no cyanosis, clubbing, or edema.  No rash.  NEURO:  Alert and oriented x3.  Cranial nerves II through XII are  intact.  Moves all 4 extremities without difficulty.  Affect is  pleasant.   ASSESSMENT AND PLAN:  1. Hypertension, well controlled.  2. Hyperlipidemia.  She is due for a fasting lipid panel and liver      panel.  We want to check this next week.  3. Atrial flutter.  She is status post ablation, this has been      quiescent.  4. History of nonobstructive coronary artery disease by      catheterization in September 2006.  She is asymptomatic.  Continue      risk factor management.   DISPOSITION:  We will see her back in 9 months for routine followup.     Shaune Pascal. Bensimhon, MD  Electronically Signed  DRB/MedQ  DD: 02/11/2008  DT: 02/11/2008  Job #: [id number]

## 2010-05-22 NOTE — Assessment & Plan Note (Signed)
[facility]                            CARDIOLOGY OFFICE NOTE   NAME:[name], [name]                          MRN:          [id number]  DATE:06/11/2006                            DOB:          1932/03/16    PRIMARY CARE PHYSICIAN:  Dr. Teressa Lower.   INTERVAL HISTORY:  [name] is a very pleasant 77 year old woman with a  history of morbid obesity, hypertension, hyperlipidemia, sleep apnea,  and atrial flutter status post previous catheter ablation who returns  today for a routine followup of her blood pressure.   She saw Richardson Dopp in the office here about a month ago.  Her blood  pressure was elevated and she was started on Diovan 80 mg a day.  She  says since that time her blood pressure has remained elevated and she  has had a chronic headache.  She brings a detailed list of her blood  pressures and average systolic blood pressure is about 140.  She denies  chest pain, shortness of breath.  No lower extremity edema.  She is  wishing to change her Diovan.   CURRENT MEDICATIONS:  1. Effexor 75 a day.  2. Neurontin.  3. Claritin.  4. Astelin.  5. Synthroid.  6. Levothyroxine.  7. Nexium 40.  8. Vytorin 10/80.  9. Aspirin 81.  10.Sular 10.  11.Atenolol 50.  12.Fosamax.  13.Diovan 80.  14.Mirapex.  15.Multiple other supplements.   PHYSICAL EXAMINATION:  She is well-appearing, no acute distress.  Ambulates around the clinic slowly without any respiratory difficulty.  Blood pressure is 137/50, heart rate is 68, weight is 208.  HEENT:  Normal.  NECK:  Supple, no JVD, carotids are 2+ bilaterally without any bruits,  there is no lymphadenopathy or thyromegaly.  CARDIAC:  PMI is nonpalpable, she has a regular rate and rhythm; no  murmurs, rubs, or gallops appreciated.  LUNGS:  Clear.  ABDOMEN:  Obese, nontender, nondistended, no evidence of  hepatosplenomegaly, no bruits, no masses, good bowel sounds.  EXTREMITIES:  Warm without no cyanosis,  clubbing, or edema.  She has a  fine petechial rash over her shins which is chronic.  Good distal  pulses.  NEURO:  She is alert and oriented x3 ,cranial nerves II-XII are intact,  moves all 4 extremities without difficulty, affect is pleasant.   ASSESSMENT/PLAN:  Hypertension, this remains suboptimally controlled.  She does not like her Diovan so we will stop this.  We will switch her  Atenolol over to Coreg 6.25 b.i.d. with the plan to titrate up to 12.5  b.i.d. over the next few weeks.  She will return in 2 months for a  followup.  I suspect she will need an additional agent, possibly  hydrochlorothiazide, spironolactone, or clonidine depending on what she  tolerates.     Shaune Pascal. Bensimhon, MD  Electronically Signed    DRB/MedQ  DD: 06/11/2006  DT: 06/11/2006  Job #: [id number]   cc:   Deborra Medina. Lenna Gilford, MD

## 2010-05-22 NOTE — Discharge Summary (Signed)
[name], [name]                   ACCOUNT NO.:  [id number]   MEDICAL RECORD NO.:  [id number]          PATIENT TYPE:  INP   LOCATION:  1438                         FACILITY:  [facility]   PHYSICIAN:  John C. Amedeo Plenty, M.D.    DATE OF BIRTH:  Jul 28, 1932   DATE OF ADMISSION:  10/19/2007  DATE OF DISCHARGE:  10/23/2007                               DISCHARGE SUMMARY   HISTORY OF ILLNESS:  The patient is a 77 year old white female admitted  on October 12 with a spell of malaise, weakness and prostration while  being evaluated in my office for a recent ordeal where she felt right  lower quadrant abdominal pain while on a plane to [place],  then was  sent to an urgent care, found to have a CT scan showing some sort of  colonic mass and then subsequently admitted to a hospital in [place],  [place].  There she was told that she had a mass in her colon that  would need to be removed surgically and a colonoscopy was recommended.  However,  the patient wished to return to [facility] for further care  and was seeing me in the office when she felt faint and weak and it was  decided to go ahead and admit her.  For details please see admission  history and physical.   [place]:  The patient had a slightly low blood pressure on  admission of 90/54.  She had a normal hemoglobin of 12.4 with a WBC  count of 8600.  EKG and cardiac enzymes were negative.  She appeared to  be acutely stable with no identifiable acute decompensation and with  hydration her overall malaise improved and her abdominal pain which had  already improved in previous days did not recur.  Dr. Irine Seal of  [facility] was consulted to assist with her overall medical  management.   From a GI standpoint it was elected to let the patient rest on the day  of admission given that she had just flown back from [place] the day  before and had felt poorly on the day of admission.  On the second  hospital day she felt  better and we went ahead and prepped her for a  colonoscopy which she tolerated well.  The colonoscopy on the third  hospital day revealed an area of confluent exudate and diffuse  inflammation primarily involving the distal cecum and an approximately 8  cm segment of the ascending colon.  The terminal ileum was intubated and  was normal.  There was some superficial erythema along the Haustral  ridges in the sigmoid colon but this did not appear to represent  significant inflammation.  Biopsies were taken from the more severely  affected areas in the ascending colon and revealed ulcerated colonic  mucosa with no granulomas or any other pathologic findings.  They were  actually felt to be possibly related to ischemia.  There was no evidence  of neoplasm.   Given that the patient had a colonoscopy 8 months earlier and had some  small atheromatous appearing ulcers  overall I felt her clinical picture  was most compatible with Crohn's.  She was started on Lialda 4.8 g a day  and Prednisone 40 mg a day.  She continued to gain strength and on the  15th her diet was advanced through full liquids to a bland diet.  On  10/23/2007 she was felt ready for discharge.   From a general medical standpoint Dr. Wonda Olds assisted in her  management and wound up getting an echocardiogram on her the results of  which are not immediately available to me but she had a D-dimer test  that was positive prompting a CT angiogram of the chest which showed no  pulmonary embolus.  She also on the day of admission had a head CT which  showed no acute intracranial abnormality.   DISCHARGE MEDICATIONS:  1. Lialda 4.8 g a day.  2. Prednisone 40 mg a day.  Her other medications,  unless changed by Dr. Grandville Silos,  are the same as  on admission that include Effexor 75 mg daily, carvedilol 25 mg b.i.d.,  Avalide 150/12.5 mg daily, amlodipine 5 mg a day, gabapentin 1500 mg a  day, Claritin 10 mg once a day, Flonase nasal  spray, levothyroxine 0.125  mg a day, Nexium 40 mg b.i.d., aspirin 81 mg a day, Mirapex 0.25 mg a  day, Vytorin 10/80 mg once a day as well as several vitamins and mineral  supplements.  We elected to discontinue Librax she was on at admission.   CONDITION ON DISCHARGE:  Improved.   FOLLOWUP:  With Dr. Amedeo Plenty in 2 to 3 weeks and with Dr. Lenna Gilford as  scheduled.           ______________________________  Elyse Jarvis Amedeo Plenty, M.D.     JCH/MEDQ  D:  10/23/2007  T:  10/23/2007  Job:  [id number]   cc:   Deborra Medina. Lenna Gilford, [place]  [place] [address]

## 2010-05-22 NOTE — Assessment & Plan Note (Signed)
[facility]                            CARDIOLOGY OFFICE NOTE   NAME:[name], [name]                          MRN:          [id number]  DATE:09/11/2007                            DOB:          [birth year]    PRIMARY CARE PHYSICIAN:  Deborra Medina. Lenna Gilford, MD.   INTERVAL HISTORY:  Ms. Kunkler is a 77 year old woman with a history of  morbid obesity, hypertension, hyperlipidemia, borderline sleep apnea,  and atrial flutter, status post previous catheter ablation, returns  today for followup.   We saw her back in April, 2009, for an unscheduled visit when she had  some syncopal episode while flying to [place] she had a full neuro  cardiac workup.  This showed a 70 to 80% of her middle cerebral artery.  She recently went to [facility] and had it  evaluated and was told that they  are going to follow up it and not stent it at this time.  She has not  had any further symptoms.  She denies any chest pain or shortness of  breath.  She does continue to feel chronic fatigue.  She has been  working with PT to try to boost her strength and endurance.  She has not  had any lower extremity edema.  Blood pressure has been good.  Her  cholesterol is followed by Dr. Jerline Pain in the [facility] and has been  well-controlled.   CURRENT MEDICATIONS:  1. Effexor 75 a day.  2. Librax.  3. Carvedilol 25 b.i.d.  4. Avalide 150/12.5 a day.  5. Amlodipine 5 a day.  6. Gabapentin 600 t.i.d.  7. Claritin 10 a day.  8. Astelin spray.  9. Synthroid 125 a day.  10.Nexium 40 a day.  11.Mirapex.  12.Aspirin 81.  13.Vytorin 10/80.  14.Multivitamin.   PHYSICAL EXAMINATION:  GENERAL:  She is obese woman, in no acute  distress, ambulates in the clinic slowly without any respiratory  difficulty.  VITAL SIGNS:  Blood pressure is 118/68, heart rate 72, and weights 219.  HEENT:  Normal.  NECK:  Supple.  No obvious JVD.  Carotid 2+ bilaterally without bruits.  There is no lymphadenopathy or  thyromegaly.  CARDIAC:  PMI is not palpable.  She is regular rate and rhythm.  No  murmurs, rubs, or gallops.  LUNGS:  Clear.  ABDOMEN:  Soft, nontender, obese, no bruits, and no masses appreciated.  EXTREMITIES:  Warm with no cyanosis, clubbing, or edema.  She does have  a rash on both shins from previous sunburn.  NEUROLOGIC:  Alert and oriented x3.  Cranial nerves II through XII are  intact.  Moves all four extremities without difficulty.  Affect is  pleasant.   EKG shows normal rhythm at rate of 72.   ASSESSMENT AND PLAN:  1. Hypertension well-controlled.  2. Hyperlipidemia.  This is followed by [facility] and her      lipids look great with total cholesterol 138, triglycerides 102,      HDL 62, and LDL 56.  3. History of atrial flutter, status post ablation, this is  quiescent.   DISPOSITION:  We will see her back in 6 to 9 months for routine  followup.     Shaune Pascal. Bensimhon, MD  Electronically Signed    DRB/MedQ  DD: 09/11/2007  DT: 09/12/2007  Job #: [id number]

## 2010-05-22 NOTE — H&P (Signed)
NAMEAZIZA, STUCKERT                   ACCOUNT NO.:  [id number]   MEDICAL RECORD NO.:  [id number]          PATIENT TYPE:  INP   LOCATION:  [place]                         FACILITY:  [facility]   PHYSICIAN:  John C. Amedeo Plenty, M.D.    DATE OF BIRTH:  1932-12-25   DATE OF ADMISSION:  10/19/2007  DATE OF DISCHARGE:                              HISTORY & PHYSICAL   HISTORY OF PRESENT ILLNESS:  This is a very pleasant 77 year old female  with a complex past medical history who was in [facility]'s  office this morning and had a sudden onset of visual difficulty,  dizziness and hypotension.  She was sent from the office to [facility]  step-down unit for admission.  She has a history of seizure disorder  along with middle cerebral artery stenosis (evaluated by Dr. Laban Emperor at  [facility]).  She is due to have a colonoscopy on October 14 to evaluate an  ascending colonic mass that was discovered in [place] on CT scan.  The  patient has been having approximately 6 diarrhea bowel movements a day.  She describes them as watery and black with grains of sand in them.  She  also has right-sided abdominal pain.  These symptoms have been chronic  for her.  She describes no weight loss.  Her GI history is significant  for GERD,  chronic abdominal pain, history of diverticulitis, history of  gastric ulcer, history of cecal ulcers and chronic diarrhea.  Her last  colonoscopy was in February 2009, Dr. [name] found ulcerations in  her ascending colon.  Path on these ulcerations were benign.  Her last  endoscopy was also February 09, 2007.  She had enlarged gastric folds.  These were biopsied and found to be CLO negative.   PAST MEDICAL HISTORY:  Significant for morbid obesity, hypertension,  hyperlipidemia, coronary artery disease, atrial flutter, she is status  post ablation middle cerebral artery stenosis of approximately 80%,  primary hyperparathyroidism, lumbar stenosis, seizure disorder,  migraines,  fibromyalgia, vertigo, obstructive sleep apnea, TIA, a  history of tuberculosis and acid reflux.  She is status post  parathyroidectomy, appendectomy, cholecystectomy, right rotator cuff  repair, back surgery, tonsils and adenoids. She has had benign breast  biopsies, a D and C for uterine cysts,  multiple orthopedic surgeries.  She has degenerative joint disease, chronic back pain and fibromyalgia.   CURRENT MEDICATIONS:  1. Effexor 75 mg once a day.  2. Librax 2 caps up to three times today to control diarrhea.  3. Carvedilol 25 mg b.i.d.  4. Avalide 150/12.5 mg daily.  5. Amlodipine 5 mg daily.  6. Gabapentin 300 mg b.i.d. and 600 mg daily.  7. Claritin 10 mg daily.  8. Flonase 50 mcg 2 sprays each nostril q.h.s.  9. Saline nasal rinse b.i.d.  10.Levothyroxine 0.125 mcg daily.  11.Nexium 40 mg b.i.d.  12.Aspirin 81 mg.  13.Mirapex 0.25 mg daily.  14.Vytorin 10 mg.  15.Optiva eye drops b.i.d. p.r.n.  16.Meclizine 25 mg p.r.n.  17.Darvocet-N 100 q.4-6 h. p.r.n.  18.Lidoderm patches p.r.n. for back pain.  ALLERGIES:  She has allergies to IBUPROFEN, TAPE, DEMEROL, DILAUDID,  MORPHINE, CODEINE, FENTANYL, BACITRACIN, INDERAL, PROZAC, ULTRAM,  TEGRETOL, REQUIP,  LYRICA, FENTANYL PATCH.   PRIMARY CARE PHYSICIAN:  Dr. Teressa Lower.   REVIEW OF SYSTEMS:  Significant for no shortness of breath, no  palpitations, no fever.   SOCIAL HISTORY:  She has a 30-pack-year tobacco history but stopped  smoking in 1962 when she had tuberculosis.   FAMILY HISTORY:  Significant for a grandmother with colon cancer.   PHYSICAL EXAMINATION:  GENERAL:  She is alert and oriented in no  apparent distress.  She has no obvious neurological deficits.  VITAL SIGNS:  Pulse 75, blood pressure is 112/44.  HEART:  Heart has a regular rate and rhythm.  LUNGS: Clear to auscultation with no murmurs, rubs or gallops.  ABDOMEN:  Obese, soft, nontender, nondistended with good bowel sounds.   LABORATORY DATA:   BMET, sodium 131, potassium 4.1, chloride 94, bicarb  27, BUN 11, creatinine 1.3.  White count 8.6, hemoglobin 12.4,  hematocrit 36.7, platelets 169,000.   ASSESSMENT:  Dr. [name] has seen and examined the patient, collected  a history, and reviewed her chart.  His impression is this is a very  pleasant 77 year old female with an ascending colonic mass found on CT  scan in [place].  She is due for colonoscopy. Experienced hypotensive,  diaphoretic, visual disturbance episode today in the [facility] office, now appears stable. Unsure if this episode was  vasovagal versus TIA versus some other etiology.  I have consulted [facility]  hospitalist,  Dr. Wonda Olds, to manage her comorbidities including  hypertension and neuro issues.  We will plan to start colonoscopy prep  in the morning if the patient is stable.      Melton Alar, PA    ______________________________  Elyse Jarvis Amedeo Plenty, M.D.    MLY/MEDQ  D:  10/19/2007  T:  10/19/2007  Job:  [id number]   cc:   Deborra Medina. Lenna Gilford, [place]  [place] [address]

## 2010-05-22 NOTE — Assessment & Plan Note (Signed)
[facility] NOTE   NAME:[name], [name]                          MRN:          [id number]  DATE:05/25/2007                            DOB:          1932/10/18    [name] is seen in the lipid clinic for further evaluation and  medication titration associated with her hyperlipidemia.  She has been  feeling and doing well overall.  She was found to have possibly a new  seizure disorder on her recent flight to [place] that has been well-  detailed in Dr. Clayborne Dana note of May 06, 2007.  She has been  working to increase her activities since this hospitalization, although  her improvement has been slow.  She has been following a low fat, low  cholesterol diet.  She has been paying attention to her diet, although  she did have a significant setback in her physical activity associated  with her hospitalization and follow-up convalescence.  Patient has been  compliant with her medication.   She has a past medical history pertinent for hyperlipidemia and morbid  obesity, hypertension, borderline sleep apnea, and atrial flutter,  status post previous catheter ablation.   CURRENT MEDICATIONS:  1. Effexor 75 mg daily.  2. Librax twice daily.  3. Carvedilol 25 mg twice daily.  4. Avalide 150/12.5 mg daily.  5. Amlodipine 5 mg daily.  6. Gabapentin 600 mg 3 times daily.  7. Claritin 10 mg daily.  8. Astelin nasal spray twice daily.  9. Levothyroxine 125 mcg daily.  10.Nexium 40 mg daily.  11.Mirapex 0.25 mg daily.  12.Aspirin 81 mg daily.  13.Fosamax 70 mg 1 day each week.  14.Vytorin 10/80 1 tablet daily.  15.Multivitamin daily.  16.Complex multivitamin with C daily.   ALLERGIES:  BACITRACIN, which yields rash and severe itching.   REVIEW OF SYSTEMS:  As sated in HPI, otherwise negative.   PHYSICAL EXAMINATION:  Weight today is 215 pounds, down 3 pounds from  her last visit.  Heart rate today is 72.   Respirations are 16.  Patient appears comfortable overall.   Her labs obtained on April 09, 2007 reveal improving LFTs with an AST of  42, ALT of 49, total cholesterol 138, triglycerides 102, HDL 61.9, LDL  56.   ASSESSMENT:  The patient has improved her labs overall.  Her weight is  down slightly.  I have asked her to increase her physical activity as  much as she is able to improve her overall health status through her  physical activities.  She will follow up with me in six months and  continue her medications as prescribed in the meantime.  She will call  with questions or problems.      Alinda Deem, PharmD, BCPS, CPP  Electronically Signed      Minus Breeding, MD, [facility]  Electronically Signed   MP/MedQ  DD: 05/26/2007  DT: 05/26/2007  Job #: [id number]   cc:   Shaune Pascal. Bensimhon, MD

## 2010-05-22 NOTE — Assessment & Plan Note (Signed)
[facility]                            CARDIOLOGY OFFICE NOTE   NAME:[name], [name]                          MRN:          [id number]  DATE:11/17/2006                            DOB:          September 04, 1932    PRIMARY CARE PHYSICIAN:  Deborra Medina. Lenna Gilford, M.D.   INTERVAL HISTORY:  Ms. Handyside is a very pleasant 77 year old woman with a  history of morbid obesity, hypertension, hyperlipidemia, mild sleep  apnea, and atrial flutter status post previous catheter ablation.  She  returns today for routine followup of her blood pressure.   We have adjusted her blood pressure regimen over the last few months and  now she is just on Coreg.  She says she is tolerating this well.  She  brings a detailed list of her blood pressure readings in and most  systolic pressures have been in the 120 to 130 range.  But, occasionally  she has readings in the one teens.  She says she feels good, except for  a recent bronchitis.  She has not had any significant dyspnea or chest  pain.  Her lipids are being followed by the lipid clinic.   CURRENT MEDICATIONS:  1. Coreg 18.75 b.i.d.  2. Calcium.  3. Vitamin D.  4. Glucosamine.  5. Omega-3.  6. Effexor 75 a day.  7. Neurontin.  8. Synthroid 137 a day.  9. Nexium 40.  10.Vytorin 10/80.  11.Aspirin 81.  12.Sular 10.  13.Fosamax.   PHYSICAL EXAMINATION:  GENERAL:  She is an obese woman in no acute  distress, ambulates around the clinic with no significant respiratory  difficulty.  VITAL SIGNS:  Blood pressure initially was 122/62 on manual recheck  135/54, heart rate 72, weight is 212.  HEENT:  Normal.  NECK:  Supple.  No  JVD.  Carotids are 2 plus bilaterally without any  bruits.  There is no lymphadenopathy or thyromegaly.  CARDIAC:  PMI is not palpable.  She has a regular rate and rhythm.  No  murmurs.  There is a soft S4.  LUNGS:  Clear.  ABDOMEN:  Obese, nontender, nondistended.  No hepatosplenomegaly  appreciated.  No  bruits.  No masses.  Good bowel sounds.  EXTREMITIES:  Warm with no cyanosis, clubbing, or edema.  No rash.  She  has good distal pulses.  NEUROLOGIC:  Alert and oriented x3.  Cranial nerves II-XII are intact.  Moves all 4 extremities without difficulty.  Affect is pleasant.   ASSESSMENT:  1. Hypertension.  This is coming under better control.  We will      increase her Coreg to 25 twice a day and see her back in 6 months      for routine followup.  2. Hyperlipidemia.  LDL is at goal.  She is followed by the lipid      clinic.  Continue Vytorin.   DISPOSITION:  Return to the clinic in 6 months.     Shaune Pascal. Bensimhon, MD  Electronically Signed    DRB/MedQ  DD: 11/17/2006  DT: 11/17/2006  Job #: [id number]

## 2010-05-22 NOTE — Assessment & Plan Note (Signed)
[facility]                            CARDIOLOGY OFFICE NOTE   NAME:Steege, JENELL DOBRANSKY                          MRN:          [id number]  DATE:05/06/2007                            DOB:          [birth year]    PRIMARY CARE PHYSICIAN:  Deborra Medina. Lenna Gilford, MD   NEUROLOGIST:  Dr. Floyde Parkins   INTERVAL HISTORY:  Ms. Mathison is a 78-year woman with history of morbid  obesity, hypertension, hyperlipidemia, borderline sleep apnea, atrial  flutter status post previous catheter ablation who returns today for an  unscheduled followup.   Apparently, she was flying to [place] and when the plane arrived she  was found slumped in her chair and essentially unarousable.  The medics  were summoned. Her blood pressure and heart rate were stable despite her  mental status.  She was taken to the hospital. She had a thorough workup  and she had an echocardiogram which was normal and her telemetry was  normal.  She had an MRI, MRA of the brain which showed an 80% stenosis  of her middle cerebral artery, but the vertebral  system was patent.  She recovered, but remained amnestic to the event.   Since being home, she has seen Dr. Lenna Gilford and also Floyde Parkins in  neurology. She had an EEG which was somewhat suggestive of possible  seizure disorder.  She is following up with him.  She is also going to  see the Neurosurgery Clinic at [facility] for further evaluation.   She denies any chest pain or shortness of breath.  No lower extremity  edema.  Blood pressure has been well-controlled.  She had a Myoview a  couple years ago which was totally normal.   CURRENT MEDICATIONS:  1. Amoxicillin.  2. Effexor 75 a day.  3. Librax.  4. Coreg 25 b.i.d.  5. Avalide 150/12.5 a day.  6. Amlodipine 5 a day.  7. Gabapentin 600 mg t.i.d.  8. Claritin 10 a day.  9. Astelin.  10.Synthroid 125 mcg a day.  11.Nexium 40 a day.  12.Aspirin 81.  13.Mirapex.  14.Fosamax.  15.Vytorin 10/80.  16.Multivitamin and vitamin C.   PHYSICAL EXAM:  She is an obese woman, no acute distress. Ambulates  around the clinic without any respiratory difficulty.  Blood pressure is  112/56, heart rate 75, weight 218.  HEENT is normal.  NECK:  Supple.  There is no JVD.  Carotids are 2+ bilaterally without  bruits.  There is no lymphadenopathy or thyromegaly.  CARDIAC:  PMI is  nondisplaced.  She has regular rate and rhythm.  No murmurs, rubs or  gallops.  LUNGS:  Clear.  ABDOMEN:  Soft, nontender, obese, no bruits, no masses appreciated.  EXTREMITIES:  Warm with no cyanosis, clubbing or edema.  NEURO:  Alert and oriented x3.  Cranial nerves II-XII grossly intact.  There is no pronator drift.  Moves all four extremities without  difficulty.  Affect is pleasant.   EKG shows normal sinus rhythm with no ST-T wave abnormalities.   ASSESSMENT/PLAN:  1. Altered mental status.  I do  not think this is cardiac in origin.      Does seem reasonable that this may be a seizure activity with post      ictal state.  However, I will leave this to Dr. Jannifer Franklin and his      team.  2. Hypertension, well-controlled.   DISPOSITION:  Will see her back in clinic in 5-6 months for routine  followup.     Shaune Pascal. Bensimhon, MD  Electronically Signed    DRB/MedQ  DD: 05/06/2007  DT: 05/06/2007  Job #: [id number]   cc:   Jill Alexanders, M.D.

## 2010-05-25 NOTE — Discharge Summary (Signed)
[name], [name]                   ACCOUNT NO.:  [id number]   MEDICAL RECORD NO.:  [id number]          PATIENT TYPE:  INP   LOCATION:  8921                         FACILITY:  [facility]   PHYSICIAN:  Jessy Oto, M.D.   DATE OF BIRTH:  Oct 26, 1932   DATE OF ADMISSION:  05/21/2005  DATE OF DISCHARGE:  05/25/2005                                 DISCHARGE SUMMARY   DISCHARGE DIAGNOSES:  1.  Lumbar spinal stenosis L3-4 and L4-5.  2.  Multiple narcotic allergies.  3.  Obesity.  4.  Hyperlipidemia.  5.  Atrial arrhythmia status post ablation x2.  6.  Diverticulosis, history of colitis and ulcer in the past.  7.  History of seizure disorder.  8.  Hypertension.  9.  Sleep apnea.  10. Migraines.  11. History of benign breast biopsies.   PROCEDURE PERFORMED:  During this hospitalization, 05/21/2005 central  decompressive laminectomy at L3-4 and L4-5 performed by Dr. Basil Dess,  assistant Dr. Tarri Glenn.  Estimated blood loss 250 mL.   HISTORY OF PRESENT ILLNESS:  Please see details of this patient's history  and physical examination on admission transcribed previously.  Patient is a  77 year old female with a history of increasing neurogenic claudication  associated with MRI study demonstrating severe facet arthrosis changes and  degenerative disc bulging, as well as severe central stenosis and left  foraminal stenosis at L4-5 and L3-4.  She is brought to the operating room  to undergo decompressive laminectomy to improve her function, and diminish  her discomfort associated with her severe spinal stenosis.  Please see  details of this patient's admission history including past medical history,  surgical history, family history, and review of systems listed.   ALLERGIES:  DILAUDID, BACITRACIN, DEMEROL, NON-STEROIDAL ANTI-INFLAMMATORY  AGENTS, MORPHINE, CODEINE, TEGRETOL, INDOMETHACIN, PROPRANOLOL, PROZAC,  DICLOFENAC, TRAMADOL.   The patient's admission laboratory tests  demonstrate a white cell count of  9.0 thousand, hemoglobin 15.3, hematocrit 43.6.  PT and PTT were normal.  Her C-met showed a mildly elevated potassium of 5.5 with a calcium of 11.6,  sodium 140, chloride 104, CO2 29, BUN 13, creatinine 0.6.  Followup  potassium demonstrated a normal potassium throughout her hospitalization.  Urinalysis was unremarkable.  Preoperatively the patient had undergone a  cardiac evaluation.  She was felt to be a stable surgical candidate for  surgical intervention.  Primary care physician is Dr. Teressa Lower.  The  patient's preoperative chest x-ray demonstrated no active disease.  EKG with  no acute abnormalities.   HOSPITAL COURSE:  This patient was admitted to [facility] following  surgery on 05/21/2005 in the form of central decompressive laminectomy of  both the L3-4 and L4-5 levels.  At the time of surgery, a Foley catheter was  placed and a Hemovac drain.  Postoperatively she was maintained on IV  antibiotics for a period of 24 hours postop.  Her Hemovac drain was then  removed on postoperative day #1.  The patient was begun on physical therapy,  occupational therapy postoperative day #1 with gradual increasing  ambulation.  She noted immediate improvement in leg symptoms, decreased leg  pain, decreased weakness in her lower extremities; however, significant  discomfort associated with her surgical intervention.  Due to numerous  narcotic allergies, she was placed on a Fentanyl IV PCA.  She remained on  muscle relaxers postop throughout her hospital course.  Corset was used to  also diminish her discomfort associated with perioperative muscular spasm  and discomfort.  Foley catheter was discontinued on postoperative day #2.  The patient's bowel function resumed on postoperative day #2 and she was  able to be advanced in her diet.  The patient's BMI in the 38-39 level.  She  was noted to be obese.  During her postoperative course, her dressings   remained dry.  She, however, did have abdominal distention for a period of 2-  3 days requiring careful maintenance of IV fluids and careful advancement of  her p.o. diet.  She was tried on a course of Talwin which also seemed to  cause her discomfort and nausea.  So Darvocet N-100 was the only medication  she was capable of taking without a great deal of GI discomfort and nausea.  On postoperative day #4, she was ambulating well, standing and walking with  minimal assistance.  Her vital signs were stable.  There was no evidence of  wound infection.  Her incision was dry without erythema or drainage.  Legs  demonstrated normal neurovascular status.  She had moved her bowels and was  passing flatus.  She was discharged home on postoperative day #4 05/25/2005  with use of a walker and referred to home health nursing for occupational  therapy, as well as physical therapy.   MEDICATIONS AT DISCHARGE:  1.  Valium 5 mg #30 one tablet p.o. t.i.d. p.r.n. spasm.  2.  Darvocet N-100 #50 1-2 q. 4-6 hours p.r.n. pain.  She was unable to      tolerate Talwin.   STATUS AT THE TIME OF DISCHARGE:  Improved.   DISPOSITION:  [name] was discharged home on 05/25/2005 postoperative day  4 from undergoing a 2-level central decompressive laminectomy.  She was  discharged on medications of Valium and Darvocet.  She is able to bathe on  postoperative day #5.  Her diet was unrestricted.  She was to ambulate with  the use of a walker.  Avoid lifting weight greater than 10 pounds for a  period of six weeks to be seen back in my office at [facility] in  two weeks from the time of her discharge.  The patient was to resume her  preoperative admission medications at the time of her discharge.      Jessy Oto, M.D.  Electronically Signed     JEN/MEDQ  D:  07/12/2005  T:  07/12/2005  Job:  [id number]

## 2010-05-25 NOTE — Op Note (Signed)
[name], [name]                   ACCOUNT NO.:  [id number]   MEDICAL RECORD NO.:  [id number]          PATIENT TYPE:  INP   LOCATION:  5033                         FACILITY:  [facility]   PHYSICIAN:  Jessy Oto, M.D.   DATE OF BIRTH:  12/02/1932   DATE OF PROCEDURE:  05/23/2005  DATE OF DISCHARGE:                                 OPERATIVE REPORT   PREOPERATIVE DIAGNOSIS:  Lumbar spinal stenosis, L3-4 and L4-5.  Neurogenic  claudication.   POSTOPERATIVE DIAGNOSIS:  Lumbar spinal stenosis, L3-4 and L4-5.  Neurogenic  claudication.   PROCEDURE:  Central decompressive laminectomy for lumbar spinal stenosis, L3-  4 and L4-5.  Decompression of bilateral L3, bilateral L4 and bilateral L5  nerve roots.   SURGEON:  Dr. Basil Dess.   ASSISTANT:  Dr. Tarri Glenn.   ANESTHESIA:  GOT, Dr. Chriss Driver.   ESTIMATED BLOOD LOSS:  200 cc.   DRAINS:  Hemovac x1, Foley to straight drain.   CLINICAL HISTORY:  The patient is a 77 year old female who has undergone  conservative management for lumbar spinal stenosis including epidural  steroid use, anti-inflammatory agents and medications for pain and  discomfort.  She has seen a gradual diminished endurance to her standing and  walking due to progressive weakness in her legs with this position.  No  weakness or numbness or symptoms in a sitting position.  Her preoperative  studies have demonstrated moderately severe lumbar spinal stenosis, L4-5  greater than L3-4.  Both judged, though, to be moderately severe.  She is  brought to the operating room to undergo central decompressive laminectomy  for lumbar spinal stenosis.  She does have degenerative disk changes at  multiple segments with spondylosis associated with the findings of spinal  stenosis.   INTRAOPERATIVE FINDINGS:  As above, the most severe levels appeared to be  primarily the foraminal narrowing and lateral recess stenosis associated the  L5 nerve roots, bilateral left side greater  than right, foraminal  decompression noted bilateral L4 bilateral L3 nerve roots.   DESCRIPTION OF PROCEDURE:  After adequate general anesthesia, the patient in  the knee-chest position, Andrews frame was used.  Standard preoperative  antibiotics.  Standard prep with DuraPrep solution.  She did have Foley  catheter placed prior to turning to the [place] frame.  After being  positioned, she underwent the standard prep with DuraPrep solution.  She was  draped in the usual manner.  Preoperative antibiotics of Ancef.  The initial  incision made at the expected L4 level, and it continued upwards an  additional level.  Through the skin and subcutaneous layers after  infiltration of Marcaine 0.5% with 1:100,000 epinephrine, incision was  carried through the skin and fatty layers down to the lumbodorsal fascia.  Here, incision was made on both sides of spinous process of the expected L5  level.  Intraoperative lateral radiograph demonstrated the marker on the L3  spinous process.  This was then marked for continued identification  throughout the case and the incision continued caudally an additional 1.5  inches.  Through skin and subcu layers  down to the lumbodorsal fascia at  this level as well.  Lumbodorsal fascia was then incised on both sides of  the spinous process of L5, L4, L3 and of L2.  Electrocautery was used to  carefully divide the muscle attachments off the lateral aspects of the  spinous process at each level and off of the lamina posteriorly.  Cobb was  then used carefully remove further the paralumbar muscles off posterior  elements and off of the facettes, preserving the facettes at each level.  A  Viper retractor was inserted after hemostasis was obtained.  Leksell rongeur  was then used to remove the upper 30% of the spinous process of L5, the  entire spinous process of L5-4, the entire spinous process of L3 and the  lower 30% of the spinous process of L2.  The Leksell rongeur  was used to  further thin the posterior aspect of lamina, L4 and L3, and the inferior  aspect lamina of L2 bilaterally.  A 3-mm Kerrison was then able to be  introduced in the midline beneath the lamina of L4, resecting the central  portions of the lamina of L4 upwards to the L3-4 interspace. Care taken to  protect the dural sac using cottonoids provided.  The Kerrison was then  introduced beneath the lamina of L3, used to resect the central portions of  lamina of L3 upwards to the L2-3 level.  The L2-3 level ligament flavum was  resected in the midline and out laterally off the medial aspect of the  facettes and off of the ventral aspect of lamina of L2, decompressing this  area.  And then continuing from cranial to caudal, two 3-mm Kerrisons were  then used to resect remaining portions of lamina centrally, preserving the  pars area, at least 8 mm of the pars.  Lateral recess of L2-3 was  decompressed nicely and foraminotomy performed of bilateral L3 nerve roots.  Resecting the reflected portion of ligament flavum and portions of the  superior articular facette of L4 at this level and L3-4.  Lateral recess of  L3-4 was further decompressed using osteotomes to resect medial 20% of each  joint right and left side.  Then resecting the ligament flavum that was  hypertrophied and out to the level of medial aspect of the pedicle of L4  laterally.  Continuing the decompression, then the ligament flavum was  resected at the L3-4 segment.  This was impressing on the thecal sac  bilaterally and was resected out to the medial aspect of the facette of both  sides.  Foraminotomy was then performed over both L4 nerve roots, resecting  the medial aspect the ligament flavum as it inserted into the medial facette  at L4-5 and off the superior articular process at L5 where it was  compressing the L4 roots both sides.  The ligament flavum was then resected in the midline at the L4-5 level after widening the  laminotomy region and  performing partial facetectomies bilaterally with osteotome over about 20  25% both sides.  There was shingling of the L4-L5 vertebral bodies  posteriorly of the lamina, and foraminotomy was necessary along with medial  facetectomy in order to decompressed the L5 nerve roots, resecting  hypertrophic ligament flavum and decompressing the lateral recess, the L5  nerve root as well as decompressing the foramen of both L5 nerve roots.  Particularly on the left side, there was indentation on the lateral aspect  of thecal sac at the shoulder the  L5 nerve root secondary to hypertrophic  changes involving the facette as well as underlying spondylosis of the disk,  spurs off the posterior left side of the disk affecting the lateral recess  causing primarily L5 nerve compression.  Following this decompression,  hemostasis was obtained, applying bone wax to bleeding cancellous surfaces.  Thrombin-soaked Gelfoam applied.  Cottonoids used to obtain hemostasis.  When hemostasis had been obtained, the cottonoids were then removed as well  as excess Gelfoam.  Note that the operating room microscope was draped and  brought into the field after initial thinning of the posterior elements  using Leksell rongeur. Loop magnification and headlamp was used for the  initial portions of procedure and then the operating room microscope used  for central decompression as well as foraminal decompression at each level.  After being determined to have an adequate decompression, there was normal  pulsation of the thecal sac, each of the neural foramen of the L3, L4 and L5  could be probed using a nerve probe freely demonstrating the exiting nerve  root at each level and centrally as well over the central portions of the  superior aspect lamina of L5 and over the inferior aspect lamina of L2.  With this and after adequate hemostasis had been obtained, portions of  Gelfoam were then placed over the  central laminotomy, each in a single  layer.  Medium Hemovac drain was placed in the depths incision extending  over the left lower back.  The paralumbar muscles were reapproximated in the  midline loosely with interrupted #1 Vicryl sutures, taking care to avoid the  drain.  Lumbodorsal fascia was reattached to the spinous processes of L2 and  L1 superiorly and L5 and S1 inferiorly.  Then reapproximated to itself in  the midline using interrupted figure-of-eight simple sutures of #1 Vicryl.  Deep subcu layers were approximated with interrupted #1-0 Vicryl sutures,  more superficial layers with interrupted 2-0 Vicryl sutures and the skin  closed with running subcu stitch of 4-0 Vicryl. Tincture of Benzoin and  Steri-Strips applied, 4x4s, ABD pad affixed to the skin with Hypafix tape.  The patient was then returned to a supine position, reactivated, extubated  and returned recovery room in satisfactory condition.  All instrument and sponge counts were correct.  Surgical assistance of Dr. Jeneen Rinks [place].  Dr. Pearla Dubonnet assistance was necessary for retraction of neural  structures, suctioning using meticulous care and the exposure of the  posterior aspect of the cervical spine.  He  assisted and performed foraminotomies during portions of the procedure, as  well as the use of the De'richo for retraction of the neural elements.  This  part of this procedure was eminently necessary to guard the patient's nerve  roots during the procedure.      Jessy Oto, M.D.  Electronically Signed     JEN/MEDQ  D:  05/23/2005  T:  05/23/2005  Job:  [id number]

## 2010-05-25 NOTE — Op Note (Signed)
NAME:  [name], [name]                             ACCOUNT NO.:  [id number]   MEDICAL RECORD NO.:  [id number]                   PATIENT TYPE:  OIB   LOCATION:  9417                                 FACILITY:  [facility]   PHYSICIAN:  Deboraha Sprang, M.D.               DATE OF BIRTH:  03/24/1932   DATE OF PROCEDURE:  05/06/2003  DATE OF DISCHARGE:  05/07/2003                                 OPERATIVE REPORT   DESCRIPTION OF PROCEDURE:  Following obtaining informed consent, the patient  was brought to the electrophysiology laboratory and placed on the  fluoroscopic table in the supine position.  After routine prep and drape,  cardiac catheterization was performed under general anesthesia under the  care of Lorrene Reid, M.D.  Following the procedure the catheters were  removed, hemostasis was obtained, and the patient was transferred to the  recovery room in stable condition.   CATHETERS:  A 5 French quadripolar catheter was inserted via the left  femoral vein to the AV junction, measuring this with an electrogram.  A 7  [place] Duodekapolar catheter was inserted via the right femoral vein to  mapping sites in the tricuspid annulus and the coronary sinus.  A 7 French 8  mm deflectable tip catheter was inserted via the right femoral vein using  SL4 sheath to mapping sites in the posterior septal space and then  manipulated into the right ventricle.   Surface leads 1, aVF, and V1 were monitored continuously throughout the  procedure.  Following insertion of the catheters, the stimulation protocol  included:  Incremental atrial pacing, incremental ventricular pacing, single  atrial extrastimuli at paced cycle lengths of 600 milliseconds, burst atrial  pacing for the induction of tachycardia as well as the arrhythmia mapping  and anatomical mapping.   RESULTS:  Surface electrocardiogram:  Rhythm; initial sinus and final sinus.  Cycle length; 768 milliseconds initial and 748 milliseconds final.   PR  interval; N/M initial and 141 milliseconds final.  QRS duration; 80  milliseconds initial and 81 milliseconds final.  QT interval; 368  milliseconds initial and 428 milliseconds final.  P wave duration was 120  milliseconds initial and 100 milliseconds final.  Bundle branch block was  absent and absent.  Preexcitation was absent and absent.  AH interval; 16  milliseconds initial and 64 milliseconds final.  HV interval; 34  milliseconds initial and 54 milliseconds final.  His Bundle duration was 14  milliseconds initial and not measured final.   AV nodal function:  AV Wenckebach cycle length 400 milliseconds.  VA  conduction was dissociated.   The AV nodal effective refractory period at a paced cycle length of 600  milliseconds was 420 milliseconds and AV nodal conduction was continuous pre  and post ablation and was dissociated retrograde.   Accessory pathway function:  No evidence of an accessory pathway was  identified.   Ventricular response  to programmed stimulation was normal for the  ventricular stimulation as described above.   Arrhythmias induced:  Typical atrial flutter was induced with rapid pacing  from the coronary sinus. The atrial cycle length was 270 milliseconds and it  terminated spontaneously as well as with RF ablation.  It should be noted  that it mapping demonstrated concealed conduction in the cavotricuspid  isthmus.   Atrial tachycardia was repeatedly induced with single atrial extrastimuli  that was incessant. The cycle length ranged from 445 to 520 milliseconds.  It could be easily terminated with burst pacing at 400 milliseconds.  Mapping of this tachycardia demonstrated that it came from the right lateral  crista.   Radiofrequency energy applied to the cavotricuspid successfully interrupted  conduction across the cavotricuspid isthmus resulting in bidirectional block  eliminating the substrate from the patient's atrial flutter.  In addition,  RF  applied at the site of the atrial tachycardia successfully accelerated  and then terminated atrial tachycardia.  However, only 18 seconds of RF was  applied at this site because of dislodgement of the lead.  Further, the  tachycardia could no longer be induced and without electroanatomical mapping  to clarify the site, it was elected not to apply anymore RF energy.   Radiofrequency energy a total of 6 minutes 30 seconds of RF energy were  applied primarily to the cavotricuspid isthmus and 18 seconds were applied  to the site of the atrial tachycardia.   Fluoroscopy time was less than 20 minutes.   IMPRESSION:  1. Normal sinus function.  2. Abnormal atrial function manifested by a sustained readily inducible and     persistent atrial tachycardia originating from the right lateral crista.     RF energy applied to the site successfully terminated the tachycardia     (see above).  3. In addition, the patient had typical atrial flutter that was inducible     and RF energy applied across the cavotricuspid isthmus successfully     terminated the tachycardia and eliminated its substrate.  4. Normal AV nodal function.  5. Normal His-Purkinje system function.  6. No accessory pathway.  7. Normal ventricular response to programmed stimulation.   CONCLUSION:  The results of electrophysiological testing identified two  different arrhythmia substrates both of which had been seen clinically  demonstrating the atrial flutter as well as this atrial tachycardia.  RF  energy successfully limited the substrates for both of these tachycardias  with the caveats noted above.                                               Deboraha Sprang, M.D.    SCK/MEDQ  D:  05/09/2003  T:  05/09/2003  Job:  [id number]

## 2010-05-25 NOTE — Discharge Summary (Signed)
[name], [name]                   ACCOUNT NO.:  [id number]   MEDICAL RECORD NO.:  [id number]          PATIENT TYPE:  INP   LOCATION:  1419                         FACILITY:  [facility]   PHYSICIAN:  Jenkins Rouge, M.D.     DATE OF BIRTH:  1932/09/06   DATE OF ADMISSION:  10/02/2004  DATE OF DISCHARGE:  10/03/2004                                 DISCHARGE SUMMARY   PRIMARY CARE PHYSICIAN:  Dr. Teressa Lower   PRIMARY CARDIOLOGIST:  Dr. Terald Sleeper   PRINCIPAL DIAGNOSIS:  Chest pain.   OTHER DIAGNOSES:  1.  History of atrial flutter and atrial tachycardia status post ablation      May of 2006.  2.  History of negative functional study in May of 2006.  3.  History of hypertension.  4.  Hyperlipidemia.  5.  Family history of coronary artery disease and obesity.  6.  History of diverticulosis, diverticulitis.  7.  Gastroesophageal reflux disease.  8.  NSAID-induced gastric ulcers.  9.  Hiatal hernia.  10. Hypothyroidism.  11. Secondary hyperparathyroidism.  12. History of tuberculosis 1962.  13. History of fibromyalgia.  14. Obstructive sleep apnea.  15. Status post cholecystectomy.   ALLERGIES:  Multiple allergies including DEMEROL, HYDROMORPHONE, CODEINE,  CARBEMAZEPINE, ENDOMETHACINE, PROPRANOLOL, FLUOXETINE, DICLOFENAC,  BACITRACIN INJECTION, TRAMADOL.   PROCEDURE:  Left heart catheterization.   HISTORY OF PRESENT ILLNESS:  A 77 year old white female with no prior  history of CAD who was driving in a car on her way to her physician's office  when she developed left shoulder pain that radiated to the substernal area  of her chest.  She was seen by Dr. [name] and was sent to the [facility]  Emergency Room where she was given sublingual nitroglycerin with relief of  discomfort.  She was then admitted to [facility] service for further  evaluation.   HOSPITAL COURSE:  [name] ruled out for MI by cardiac enzymes x3.  Her EKG  had no acute changes.  Decision was made to  perform cardiac catheterization  and therefore she was transferred to [facility] September 27 and taken to  the cardiac catheterization laboratory where catheterization revealed normal  left main, a 30% lesion in the proximal LAD, normal left circumflex, and  normal RCA.  She had a hyperdynamic ventricle with an EF 65%.  As a result  of these findings it was deemed that her discomfort was noncardiac in nature  and she is being discharged home today in satisfactory condition.   DISCHARGE LABORATORIES:  Hemoglobin 13.3, hematocrit 38.9, WBC 8, platelets  211.  Sodium 137, potassium 4.7, chloride 104, CO2 28, BUN 17, creatinine  0.8, glucose 118.  Total bilirubin 0.1, alkaline phosphatase 80, AST 70, ALT  107, total protein 5.6, albumin 2.9, calcium 9.9, magnesium 2.4.  Cardiac  markers are negative x3.  Total cholesterol 189, triglycerides 133, HDL  56.4, LDL 106.  TSH 0.283.   DISPOSITION:  Patient is being discharged home in good condition.   FOLLOW-UP PLANS AND APPOINTMENT:  She is asked to follow up with  Dr. Lenna Gilford  in one to two weeks.   DISCHARGE MEDICATIONS:  1.  Atenolol 50 mg daily.  2.  Protonix 40 mg daily.  3.  Neurontin 300 mg b.i.d.  4.  Mirapex 0.5 mg q.h.s.  5.  Sular 20 mg daily.  6.  Flonase one spray each nostril b.i.d.  7.  Zyrtec 10 mg daily.  8.  Fentanyl patch 25 mcg an hour every three days.  9.  Vytorin 10/40 mg q.h.s.  10. Synthroid 125 mcg q.h.s.  11. Singulair 10 mg daily.  12. Astelin one spray each nostril b.i.d.  13. Multivitamin one daily.   OUTSTANDING LABORATORY STUDIES:  None.   DURATION OF DISCHARGE ENCOUNTER:  45 minutes.      Rogelia Mire, NP    ______________________________  Jenkins Rouge, M.D.    CRB/MEDQ  D:  10/03/2004  T:  10/04/2004  Job:  [id number]   cc:   Deborra Medina. Lenna Gilford, M.D. [facility]  [address]  [place] [address]

## 2010-05-25 NOTE — Op Note (Signed)
[facility]  Patient:    [name]               MRN: [id number] Proc. Date: 02/01/99 Adm. Date:  [id number] Attending:  Owens Loffler                           Operative Report  PREOPERATIVE DIAGNOSIS: 1. Chronic right ethmoid and maxillary sinusitis. 2. Lesion, right anterior nasal septum.  POSTOPERATIVE DIAGNOSIS: 1. Chronic right ethmoid and maxillary sinusitis. 2. Lesion, right anterior nasal septum.  OPERATIONS PERFORMED: 1. Endoscopic right ethmoidectomy. 2. Right [place] lock with stripping. 3. Excision of lesion in the right anterior nasal septum.  SURGEON:  Robert L. Purcell Nails, M.D.  ANESTHESIA:  General.  INDICATIONS FOR PROCEDURE:  This 77 year old white female has had a history of chronic infection in the right ethmoid and maxillary sinusitis, with cultures growing out frequently Staphylococcus Pseudomonas as well as other bacteria; would temporarily respond to antibiotics and then would regress with drainage, pain and odor.  Examination showed the left side to be well healed post-ethmoidectomy maxillary antrostomy.  On the right side there was thickened erythematous mucosa in the right ethmoid, with heavy submucosal scarring and some polypoid changes in he anterior edge of the ethmoid.  There was heavy scarring around a patent maxillary antrostomy, with a large amount of thick, mucopurulent exudate.  A CT scan showed chronic thickening of the right ethmoid and maxillary sinus with fluid.  The frontal and sphenoid sinuses were clear.  The left sinuses were clear. The patient is admitted for surgery.  DESCRIPTION OF PROCEDURE:  After satisfactory general endotracheal anesthesia had been induced, topical epinephrine packs were placed on the right side of the nose. The right anterior nasal septum was infiltrated with a 1% Xylocaine, containing  1:100,000 epinephrine; which was also infiltrated  into the right anterolateral nose, remaining right middle turbinate, and into the right canine fossa.  Using a total of 5-6 cc for hemostasis, nose and face were then prepped with Betadine and sterile drapes applied.  Using the 0-degree endoscope, examination (as noted above) showed very thick mucosa in the right ethmoid.  There was heavy submucosal scarring, purulent exudate and some polypoid changes in the frontal ethmoid recess; also there was a very thick scarring around the patent right maxillary ostium, ith thick, mucopurulent exudate in the right maxillary sinus.  A culture was taken first from the right ethmoid anteriorly, and a second culture taken from the right maxillary sinus.  Using the endoscope and the micro debrider, complete ethmoidectomy was carried out.  Removing thickened polypoid mucosa from the frontal ethmoid recess, some residual middle ethmoid cells were taken, and the posterior ethmoid cells were opened up (which were relatively normal).  I removed most of the remaining middle turbinate to prevent re-adhesions.  I could see a widely patent mucosal-lined frontal sinus.  Temporary pack was placed and the maxillary ostium was taken down further anteriorly with the micro debrider.  I could see very thickened polypoid mucosa in the right maxillary of the sinus, which I could not removed adequately the middle antrostomy.  On the right anterior nasal septum was a 1.5 x 1.0 cm whitish, scarred area, which has been ulcerated in the past; this was simply excised, leaving the underlying  cartilage and bone intact.  A right canine fossa incision was then made.  Very thick anterior wall of the maxillary sinus was opened  up with a chisel and enlarged with Kerrison rongeurs. At this point I could see that there was a bony partition blocking off the anteroinferior part of the right maxillary sinus, so that it did not communicate as far as I could tell with the  more posterior upper portion.  This was not apparent on the CT scan which I reviewed intraoperatively.  This was taken down.  Very thickened polypoid mucosa (again using the endoscope for visual aid) was removed, preserving any relatively normal mucosa that I could.  Also I removed some thick, heavy scarring from the anterior portion of the medial antrostomy, and then a large inferior antrostomy was created.  The right maxillary sinus was then lightly packed with a strip of Adaptic gauze, impregnated with Bactroban cream (Bactroban ointment was not available), and also some Tobradex ointment was applied to the packing.  Similar packing was then placed in the ethmoid, and a smaller segment in the right anterior nasal cavity.  The canine fossa incision was then closed with running 4.0 chromic gut.  ESTIMATED BLOOD LOSS:  150-175 cc.  DISPOSITION:  The patient tolerated the procedure well and was awakened from anesthesia; taken to the recovery room in satisfactory condition. DD:  02/01/99 TD:  02/02/99 Job: [id number]

## 2010-05-25 NOTE — Op Note (Signed)
[name], [name]                   ACCOUNT NO.:  [id number]   MEDICAL RECORD NO.:  [id number]          PATIENT TYPE:  OIB   LOCATION:  1275                         FACILITY:  [facility]   PHYSICIAN:  Earnstine Regal, MD      DATE OF BIRTH:  [birth year]   DATE OF PROCEDURE:  11/07/2005  DATE OF DISCHARGE:                                 OPERATIVE REPORT   PREOPERATIVE DIAGNOSIS:  Primary hyperparathyroidism.   POSTOPERATIVE DIAGNOSIS:  Primary hyperparathyroidism.   PROCEDURE:  Right inferior minimally invasive parathyroidectomy.   SURGEON:  Earnstine Regal, MD, FACS   ASSISTANT:  Neysa Bonito, MD   ANESTHESIA:  General.   ESTIMATED BLOOD LOSS:  Minimal.   PREPARATION:  Betadine.   COMPLICATIONS:  None.   INDICATIONS:  The patient is a 77 year old white female from [place],  [place].  The patient has undergone evaluation for hypercalcemia.  Currently calcium levels have been elevated as high as 12.0.  Intact PTH  level was elevated at 107.  Sestamibi scan from September 2007 localize the  parathyroid adenoma to the right inferior position.  The patient now comes  to surgery for resection.   BODY OF REPORT:  Procedure is done in OR #11 at the [facility].  The patient is brought to the operating room, placed in supine  position on the operating room table.  Following administration of general  anesthesia the patient is positioned and then prepped and draped in the  usual strict aseptic fashion.  After ascertaining that an adequate level of  anesthesia had been obtained, and a right inferior cervical incision is made  with a #10 blade.  Dissection was carried down through subcutaneous tissues  and platysma.  Hemostasis was obtained with electrocautery.  Subplatysmal  flaps were developed.  Weitlaner retractor placed for exposure.  Strap  muscles were incised in the midline and reflected laterally.  Dissection was  begun in the tracheoesophageal groove.   Dissection was carried down to the  precervical fascia.  On palpation a nodular density is noted just along the  lateral edge of the esophagus.  This was gently dissected out and appears to  be a large parathyroid adenoma.  It is gently mobilized.  The vascular  pedicles divided between small and medium Ligaclips.  Gland is gently  excised.  Ex vivo it measures 3.5 x 1.5 x 0.8 cm in size.  It is submitted  to pathology where frozen section by Dr. Susanne Greenhouse confirms parathyroid  tissue consistent with parathyroid adenoma.  Good hemostasis was obtained in  the operative field.  Surgicel was placed in the bed of the parathyroid.  Strap muscles were reapproximated in the midline with interrupted 3-0 Vicryl  sutures.  Platysma was closed with interrupted 3-0 Vicryl sutures.  Skin was  anesthetized with  local anesthetic.  Skin was closed with running 4-0 Vicryl subcuticular  suture.  Wound is washed and dried and Benzoin and Steri-Strips were  applied.  Sterile dressings were applied.  The patient is awakened from  anesthesia and brought  to the recovery room in stable condition.  The  patient tolerated the procedure well.      Earnstine Regal, MD  Electronically Signed     TMG/MEDQ  D:  11/07/2005  T:  11/07/2005  Job:  [id number]   cc:   Jacelyn Pi, M.D.  Fax: [phone]   Deborra Medina. Lenna Gilford, [place] [address]   John C. Amedeo Plenty, M.D.  Fax: [phone]

## 2010-05-25 NOTE — Assessment & Plan Note (Signed)
HEALTHCARE                            CARDIOLOGY OFFICE NOTE   NAME:[name], [name]                          MRN:          [id number]  DATE:12/12/2005                            DOB:          October 26, 1932    PRIMARY CARE PHYSICIAN:  Dr. Teressa Lower   PATIENT IDENTIFICATION:  [name] is a delightful 77 year old woman who  returns for routine followup.   PROBLEM:  1. History of atrial flutter- status post ablation in 2005 by Dr.      Caryl Comes.  2. Hypertension.  3. Hyperlipidemia.  4. Morbid obesity.  5. History of transient ischemic attack.  6. Obstructive sleep apnea on CPAP.  7. Chronic low back pain - status post recent fusion.  8. Non-obstructive coronary artery disease by catheterization      September 2006, EF of 65%.  9. History of gastroesophageal reflux disease with previous antral      ulcers and gallstones.  10.History of hyperthyroidism - status post I-131 of ablation of her      thyroid.  11.History of hyperparathyroidism.   CURRENT MEDICATIONS:  1. Neurontin.  2. Flonase.  3. Astelin.  4. Zyrtec.  5. Singulair.  6. Protonix.  7. Mirapex.  8. Aspirin 81.  9. Sular 10.  10.Atenolol 50.  11.Vytorin 10/40.  12.Synthroid 125 mcg daily.  13.Multiple supplements.   Medication allergies or intolerance are multiple.  Please see previous  note.   INTERVAL HISTORY:  [name] returns today for routine followup.  From a  cardiac point of view she is doing well. She denies any chest pain or  shortness of breath.  She has not had any palpitations.  She did  recently undergo a low back fusion in May and was doing well for a while  and then had developed problems with hyperparathyroidism and underwent  neck surgery, so she has had a rough time.  She has not been exercising,  but she does have a stationary bike and plans to use that.  Blood  pressure has been relatively well controlled with systolics in the 952  to 841 range.   PHYSICAL  EXAMINATION:  She is in no acute distress.  Ambulates around  the clinic slowly without any respiratory distress.  Blood pressure initially was 117/50.  On manual recheck 130/58.  Heart  rate is 71, weight is 206.  HEENT:  Sclerae anicteric.  EOMI.  There is no xanthelasma.  Mucous  membranes are moist.  Oropharynx is clear.  NECK:  Is thick.  There is a well-healed surgical scar.  There is no  obvious JVD.  Carotids are 2+ bilaterally without any bruits.  There is  no lymphadenopathy, thyromegaly.  CARDIAC:  Regular rate and rhythm.  No murmurs, rubs or gallops.  LUNGS:  Are clear.  ABDOMEN:  Obese, nontender, nondistended. No hepatosplenomegaly, no  bruits, no masses appreciated.  EXTREMITIES:  Are warm with no cyanosis, clubbing or edema.  NEUROLOGIC:  She is alert and oriented x3.  Cranial nerves II through  XII are intact.  Moves all 4 extremities without  difficulty.  Affect is  bright.   EKG shows normal sinus rhythm at a rate of 71.  No ST-T wave  abnormalities.   ASSESSMENT AND PLAN:  1. Atrial flutter - status post ablation.  She is maintaining sinus      rhythm nicely.  2. Hypertension.  Fairly good control.  This is followed by Dr. Lenna Gilford.  3. Hyperlipidemia followed by our [facility].  Most recent low-      density lipoprotein on chart is 100 with mild elevation of liver      function studies which are being watched closely.   DISPOSITION:  We will see her back in 6 months for a routine followup.  I told her to call me if she would need anything sooner.     Shaune Pascal. Bensimhon, MD  Electronically Signed    DRB/MedQ  DD: 12/12/2005  DT: 12/12/2005  Job #: [id number]

## 2010-05-25 NOTE — Assessment & Plan Note (Signed)
[facility] NOTE   NAME:[name], [name]                          MRN:          [id number]  DATE:04/24/2006                            DOB:          [birth year]    The patient seen back in [facility] for further evaluation of  medication titration for her hyperlipidemia in the setting of high risk  features and atrial fibrillation.  The patient has been feeling and  doing well overall.  She has had no chest pain or shortness of breath.  She has been working to reduce the amount of food that she eats at a  time, reducing her portion size.  She continues to use her stationary  bike, though she does have a broken toe, currently, which has limited  her activity.  She has been compliant with her combination  hyperlipidemia therapy of Vytorin 10/80 one tablet daily in the evening  and Omega 3 fatty acids 2 grams daily. The patient has tolerated this  without issue or problems.  She continues to avoid tobacco and does not  use alcohol regularly.   PAST MEDICAL HISTORY:  Is pertinent for hypertension, hyperlipidemia,  hyperparathyroidism, status post surgery, heartburn, diarrhea, atrial  fibrillation.   CURRENT MEDICATIONS:  Include:  1. Neurontin 300 mg twice daily with 600 mg daily in the evening.  2. Singulair 10 mg daily at bedtime.  3. Nexium 40 mg daily.  4. Vytorin 10/80, one tablet daily.  5. Mirapex 0.25 mg daily in the evening.  6. Aspirin 81 mg daily.  7. Sular 10 mg daily at bedtime.  8. Atenolol 50 mg daily.  9. Meclizine as needed for dizziness.  10.Omega 3 fatty acids 2 grams daily.  11.Supplements as noted by the patient.  12.__________ as needed.   REVIEW OF SYSTEMS:  As stated in the HPI, otherwise negative.   PHYSICAL EXAMINATION:  Weight today 209.8 pounds.  Blood pressure is  125/70.  Heart rate is 70.   Labs on April 22, 2006 revealed total cholesterol 145, triglyceride 131,  HDL 15.1,  LDL 69, LFTs are within normal limits except an AST of 67 and  ALT of 53, both of which are stable.   ASSESSMENT:  The patient has no current complaints.  All are within  goal.  Liver function tests are stable.   PLAN:  The patient will continue her current diet.  She will resume  riding her stationary bicycle as she is able.  She will continue her  Vytorin 10/80 and her fish oil.  She will follow up in 6 months.  The  patient was seen by Vaughan Basta __________.      Alinda Deem, PharmD, BCPS, CPP  Electronically Signed      Minus Breeding, MD, [facility]  Electronically Signed   MP/MedQ  DD: 04/24/2006  DT: 04/24/2006  Job #: [id number]   cc:   Deborra Medina. Lenna Gilford, MD  Jacelyn Pi. Loanne Drilling, MD  [place] Bensimhon, MD

## 2010-05-25 NOTE — Assessment & Plan Note (Signed)
[facility]                              CARDIOLOGY OFFICE NOTE   NAME:Tuohey, JACQUELINA HEWINS                          MRN:          [id number]  DATE:08/22/2005                            DOB:          April 19, 1932    She was seen in the [place] lipid clinic on August 22, 2005. Ms. Bertucci comes  in today feeling pretty good. She had a recent lumbar decompression surgery  and reports much improvement in her back pain. She has recently been on a  vacation to the beach with family. She has been compliant with her  cholesterol medications which include Vytorin 10/40 once daily in the  evening, and Omega-3 capsules over the counter 1 g twice daily. She denies  any new muscle aches and pains. She admits to some dietary indiscretions  over her vacation. As far as exercise she is going to try and ride the  stationary bike. She is excited about this because of less back pain.   PHYSICAL EXAMINATION:  VITAL SIGNS:  Weight 207 pounds, blood pressure  150/80, heart rate 66.   LABORATORY DATA:  Total cholesterol 181, triglycerides 166, HDL 40.8, LDL  107. Liver function tests show an elevated SGOT 78 and elevated SGPT 78.   ASSESSMENT:  Ms. Hemberger has been compliant with her medications, but her lipid  panel has worsened slightly. LDL is not at goal of less than 70. I think  this can be attributed to her dietary indiscretions over the last month or  so.   PLAN:  To continue with the same medications, improve diet, improve  exercise, and follow up with [place] in three months. If the lipid panel  continues to worsen, we will consider medication changes at that time. She  was urged to call us in the meantime with any problems or concerns. Samples  of Vytorin were given.                                 Romeo Rabon, PharmD    TP/MedQ  DD:  08/22/2005  DT:  08/22/2005  Job #:  [id number]

## 2010-05-25 NOTE — Consult Note (Signed)
NAMEKAYANN, MAJ                   ACCOUNT NO.:  [id number]   MEDICAL RECORD NO.:  [id number]          PATIENT TYPE:  INP   LOCATION:  5710                         FACILITY:  [facility]   PHYSICIAN:  James L. Rolla Flatten., M.D.DATE OF BIRTH:  05-30-1932   DATE OF CONSULTATION:  12/24/2004  DATE OF DISCHARGE:  12/26/2004                                   CONSULTATION   REFERRING PHYSICIAN:  Deborra Medina. Lenna Gilford, M.D.   HISTORY:  Patient is a very nice 77 year old black female who is a long-term  patient of Dr. Jeannine Kitten.  She is admitted with her fourth episode of  abdominal pain, presumed to be due to diverticulitis.  She has previously  been followed by Dr. Philip Aspen, who did an endoscopy and colonoscopy in April  of this year.  Apparently this showed a diverticulitis and showed a gastric  ulcer.  She also had polyps removed.  We do not currently have those records  available.  She has had two bouts of pain since then and states that she  feels that her digestive system has not been right for some time up to and  preceding those procedures.  She has not had constipation but has had two  clinical bouts of pain that have been quite severe, doubled her over and she  apparently was treated by Dr. Lenna Gilford for clinical diverticulitis with  improvement.  She does not know the names of all the antibiotics that she  has taken but the last one was Flagyl.  She did get better.  She was well  last week and two days ago, awoke with pain in her lower abdomen and this  time it was right greater than left.  She has had loose stools, no blood, no  fever.  She came in and saw Dr. Lenna Gilford today, states that her pain was  possibly slightly better but still quite severe.  She has had previous  appendectomy.  On exam, she was quite tender and she was admitted with a  tentative diagnosis of diverticulitis.  Her labs today are remarkable for  white count of 7.7, hemoglobin 13.8, normal protime, normal CK and troponin  I,  normal liver function tests.   GI REVIEW OF SYSTEMS:  Remarkable for several months of progressive loose  stools up to eight or 10 loose stools a day with increased gas which has  been somewhat progressive.  She has not seen any blood.  She has also had  postprandial nausea and poor appetite.  She has not had severe pain between  these distinct episodes of pain but notes that she has never quite returned  completely to normal either.   MEDICATIONS ON ADMISSION:  1.  Neurontin 300 mg a.m., noon, 600 mg nightly to prevent seizures.  2.  Flonase b.i.d. one squirt in each nostril.  3.  Astelin b.i.d. one squirt in each nostril.  4.  Zyrtec 10 mg daily.  5.  Singulair 10 mg nightly.  6.  Synthroid 125 mg daily.  7.  Protonix 40 mg b.i.d. a.c.  8.  Vytorin  10 mg daily.  9.  Zetia 40 mg daily.  10. Mirapex 0.25 mg nightly for restless legs.  11. Aspirin.  12. Sular 20 mg daily.  13. Atenolol 50 mg daily.  14. Meclizine 25 mg p.r.n. for motion sickness.  15. Darvocet p.r.n. for back pain.  16. Lidoderm patch p.r.n. for pack pain.  17. Ibuprofen previously was stopped in April due to finding a gastric      ulcer.  18. She also takes multivitamins, __________ omega III fish oil,      glucosamine.   ALLERGIES:  She is allergic to BACITRACIN.  Intolerant of several medicines,  specifically, DEMEROL, CODEINE and MORPHINE.   PAST MEDICAL HISTORY:  1.  Recent gastric ulcer by endoscopy in April by Dr. Philip Aspen.  2.  Colon polyps removed during endoscopy in April.  3.  History of diverticulosis in the distant past with apparent three      clinical episodes of diverticulitis this year.  4.  Chronic migraine headaches.  5.  Petit mal seizures diagnosed in the mid 1990s.  6.  History of tuberculosis in the distant past, was treated for a year and      a half in the 1960s.  7.  Chronic fibromyalgia.  8.  Sleep apnea with use of nasal prongs in the past.  This was apparently      stopped in  September this year.  9.  History of atrial flutter/fibrillation status post ablation therapy.  10. Hypertension.  11. Hyperlipidemia.  12. Esophageal reflux disease.  13. Hypothyroidism.  14. History of multiple previous orthopedic surgeries.  15. Chronic back pain with spinal stenosis, marked degenerative disease.  16. History of migraine vascular headaches with abnormal brain waves.  17. History of sinus surgery.  18. Previous cholecystectomy, appendectomy, tonsillectomy.   FAMILY HISTORY:  Remarkable for ulcers, strokes, pulmonary emboli and  vascular issues.   SOCIAL HISTORY:  The patient is a Psychologist, forensic, is married, lives with her  husband.  She does not drink or smoke.   PHYSICAL EXAMINATION:  GENERAL APPEARANCE:  An obese, pleasant white female  in no acute distress.  VITAL SIGNS:  Temperature 98.5, pulse 74, blood pressure 121/57.  HEENT:  Eyes:  Sclerae are nonicteric.  Throat:  Moist mucous membranes.  NECK:  Supple with no lymphadenopathy.  LUNGS:  Clear.  CARDIOVASCULAR:  Regular rate and rhythm without murmurs or gallops.  ABDOMEN:  Obese and soft with good bowel sounds.  Marked lower abdominal  tenderness, right seems greater than left.   ASSESSMENT:  1.  Recurrent episodic lower abdominal pain, questionable due to      diverticulitis.  Question if she may have an abscess.  2.  Diarrhea, need to rule out Clostridium difficile.  3.  History of colon polyps, gastric ulcer.   PLAN:  Will check CT.  Agree with empiric antibiotics.  Will check a C.  difficile toxin.           ______________________________  Joyice Faster. Rolla Flatten., M.D.     Jaynie Bream  D:  12/24/2004  T:  12/26/2004  Job:  [id number]

## 2010-05-25 NOTE — Discharge Summary (Signed)
NAMELUN, [name]                   ACCOUNT NO.:  [id number]   MEDICAL RECORD NO.:  [id number]          PATIENT TYPE:  OIB   LOCATION:  2004                         FACILITY:  [facility]   PHYSICIAN:  Deboraha Sprang, M.D.  DATE OF BIRTH:  1932-09-22   DATE OF ADMISSION:  05/23/2004  DATE OF DISCHARGE:  05/24/2004                                 DISCHARGE SUMMARY   DISCHARGE DIAGNOSES:  1.  Status post successful radiofrequency catheter ablation of recurrent      right atrial tachycardia.  2.  History of atrial flutter, status post radiofrequency catheter ablation,      as well as ablation of atrial tachycardia which was found during the      same electrophysiology study.   SECONDARY DIAGNOSES:  1.  History of chest pain with normal Cardiolite study March 2006.  2.  Hypertension.  3.  Dyslipidemia.  4.  Obstructive sleep apnea.  5.  Recent gastric ulcer.  6.  History of diverticulitis/colon polyps.  7.  Status post multiple surgeries involving the back, heel, knee, and      rotator cuff.  8.  History of transient ischemic attack.  9.  History of tuberculosis.  10. Fibromyalgia.  11. Gastroesophageal reflux disease.  12. Chronic sinusitis.  13. Hypothyroidism.   PROCEDURE:  On May 23, 2004, radiofrequency catheter ablation of right  atrial tachycardia with no recurrent tachycardia over the 16-hour period  postprocedure prior to discharge.   DISPOSITION:  The patient is discharged on day #1 status post radiofrequency  catheter ablation of a right atrial tachycardia.  She is in sinus rhythm and  has been overnight.  She is having no chest pain.  No dyspnea.  Her  catheterization site is without swelling or bruit.  She will discharge on  the following medications:   DISCHARGE MEDICATIONS:  1.  Depakote 250 mg daily.  2.  Neurontin 300 mg daily in the morning and one at noon.  3.  Flonase twice daily application of spray.  4.  Zyrtec 10 mg daily.  5.  Singulair 10 mg daily at  bedtime.  6.  Astelin spray twice daily.  7.  Synthroid 125 mcg daily.  8.  Nexium 40 mg daily.  9.  Welchol two tablets daily.  10. Vytorin 10/80 one daily at bedtime.  11. Sular 20 mg daily.  12. Atenolol 50 mg daily.  13. Meclizine 25 mg twice daily.  14. Aspirin 81 mg daily for the next six weeks.  15. Mirapex 0.25 mg daily at night.  16. Guaifenesin 200 mg t.i.d.  17. Lasix 40 mg daily.  18. Darvocet-N 100 1-2 tablets q.3-4h. as needed for pain.   ACTIVITY:  The patient is to avoid driving for the next two days.  She is to  avoid heavy lifting for the next two weeks.   Note: If you plan dental work in the next six months, that is through  November 2006, even if it is just a teeth cleaning, call [phone] for  antibiotic coverage.  A prescription for amoxicillin has been given  to the  patient since she is planning a dental procedure in the month of June.   The patient is able to shower.  She is to call [phone] if she experiences  pain or swelling at the catheterization site.   She has an appointment with Dr. Caryl Comes at the [facility], [address], Monday, July 16, 2004, at 3:15 p.m.   BRIEF HISTORY:  [name] is a 77 year old female.  She is a patient of Dr.  Virl Axe.  She has a history of atrial arrhythmias.  She has had  radiofrequency catheter ablation for atrial flutter, and, at the same time,  an atrial tachycardia was discovered and ablated as well.  The patient has  had recurrence of atrial tachycardia.  The patient has agreed for a repeat  ablation procedure.  The patient will present electively on May 23, 2004,  for this procedure.   HOSPITAL COURSE:  The patient presented to the [facility] on May 23, 2005, for elective radiofrequency catheter ablation of right atrial  tachycardia.  The procedure went smoothly the same day, and the patient has  had no recurrence of atrial tachycardia during observation for 16 hours on   telemetry.  She has no chest pain or dyspnea.  She is afebrile.  Her  catheterization sites show no evidence of swelling, erythema, or ischemia.  The patient is discharged with medications and followup at cited above.      GM/MEDQ  D:  05/24/2004  T:  05/24/2004  Job:  [id number]   cc:   Deboraha Sprang, M.D.   Ernestine Mcmurray, M.D. [facility]   Scott M. Lenna Gilford, M.D. [facility]

## 2010-05-25 NOTE — Cardiovascular Report (Signed)
NAMEPALMYRA, ROGACKI                   ACCOUNT NO.:  [id number]   MEDICAL RECORD NO.:  [id number]          PATIENT TYPE:  OUT   LOCATION:  CATH                         FACILITY:  [facility]   PHYSICIAN:  Jenkins Rouge, M.D.     DATE OF BIRTH:  [birth year]   DATE OF PROCEDURE:  10/03/2004  DATE OF DISCHARGE:  10/03/2004                              CARDIAC CATHETERIZATION   INDICATIONS:  Chest pain, question angina.   PROCEDURE NOTE:  Left heart catheterization was done with 6-French catheter  from the right femoral artery. The left main coronary artery was normal.   The left anterior descending artery had a 30% smooth lesion in the proximal  vessel, mid and distal LAD were normal. The patient had a small first  diagonal branch and a fairly large second diagonal branch which were normal.  The circumflex coronary artery was nondominant and normal.   The right coronary artery was dominant and normal.   RAO ventriculography showed hyperdynamic LV function with an EF of 65%.  There was no gradient across the aortic valve and no MR. Aortic pressure was  in the 170/79 range. LV was in the 170/18 range.   IMPRESSION:  Patient's shoulder pain would be appear to be noncardiac in  etiology. She has no significant epicardial or coronary disease. She will be  discharged later today to follow up with Dr. Lenna Gilford. She may need further  follow up for her blood pressure.           ______________________________  Jenkins Rouge, M.D.     PN/MEDQ  D:  10/03/2004  T:  10/03/2004  Job:  [id number]   cc:   Deborra Medina. Lenna Gilford, M.D. [facility]  [address]  [place] [address]

## 2010-05-25 NOTE — H&P (Signed)
[name], [name]                   ACCOUNT NO.:  [id number]   MEDICAL RECORD NO.:  [id number]          PATIENT TYPE:  INP   LOCATION:  1846                         FACILITY:  [facility]   PHYSICIAN:  Scarlett Presto, M.D.   DATE OF BIRTH:  February 06, 1932   DATE OF ADMISSION:  01/31/2005  DATE OF DISCHARGE:                                HISTORY & PHYSICAL   PRIMARY CARDIOLOGIST:  Terald Sleeper, M.D. [facility].   PRIMARY ELECTROPHYSIOLOGIST:  Deboraha Sprang, M.D.   REASON FOR ADMISSION:  [name] is a 77 year old female with history of  nonobstructive coronary artery disease, atrial arrhythmias status post  radiofrequency ablation, who is now seen in the emergency room for  evaluation of acute dizziness followed by left-sided chest pain associated  with diaphoresis or dyspnea.   While sitting in the lobby waiting for a scheduled mammogram, at the  [facility], patient developed sudden dizziness.  Of note, she  does have history of vertigo and takes meclizine p.r.n.  She took one tablet  this morning but shortly afterwards, developed left shoulder/arm pain which  radiated down into her chest.  She had some associated dyspnea and  diaphoresis, but no nausea.  She states that the symptoms were similar to  those which preceded her cardiac catheterization in 09/06.  The cardiac  patient was taken up to the third floor and was seen by Dr. Dola Argyle.  An EKG showed no acute changes, but patient had a systolic blood pressure in  the 70's.  She was placed on intravenous fluids and transported via EMS to  the emergency room.   On arrival, patient stated her symptom shad completely resolved.  A repeat  EKG shows sinus bradycardia with no acute changes.  The patient is currently  asymptomatic.   ALLERGIES:  1.  BACITRACIN.  2.  DEMEROL.  3.  DILAUDID.  4.  CODEINE.  5.  MORPHINE.   HOME MEDICATIONS:  1.  Atenolol 50 q. day.  2.  Protonix 40 q. day.  3.  Neurontin 300 b.i.d.  4.   Mirapex 0.25 q.h.s.  5.  Sular 20 q. day.  6.  Flonase nasal spray, twice daily.  7.  Zyrtec 10 q. day.  8.  Vytorin 10/40 q. day.  9.  Synthroid 0.125 q. day.  10. Singulair 10 q. day.  11. Astelin nasal spray twice daily.  12. Meclizine 25 p.r.n.  13. Darvocet 100 p.r.n.  14. Aspirin 81 q. day.  15. Lidoderm topical analgesic patch p.r.n.   HISTORY:  1.  Nonobstructive coronary artery disease - cardiac catheterization in      September 2006.  2.  Normal left ventricular function.  3.  History of atrial arrhythmias.      1.  Status post RF ablation for atrial flutter, April 2004.      2.  Status post RF ablation for right-sided atrial tachycardia March,          2006.  4.  Hypothyroidism.  5.  History of vertigo.  6.  Gastroesophageal reflux disease.  1.  History of antral ulcer.  7.  Hypertension.  8.  Hyperlipidemia.  9.  Fibromyalgia.  10. Obstructive sleep apnea.  11. History of transient ischemic attack.      1.  Previously on Coumadin.  12. History of lower back pain.  13. History of fatty liver.  14. Status post cholecystectomy.  15. History of tuberculosis, 1962.   SOCIAL HISTORY:  The patient lives in [place] with her husband.  They  have two grown children.  She has not smoked tobacco since 1962.  Denies  alcohol use.   FAMILY HISTORY:  Negative for premature coronary artery disease.   REVIEW OF SYSTEMS:  Denies any recent fever, chills, productive cough, or  sweats.  Denies any recent exertional dyspnea, chest pain, orthopnea,  paroxysmal nocturnal dyspnea, or edema.  Denies any recent evidence of overt  bleeding.  The remaining systems negative.   PHYSICAL EXAMINATION:  Blood pressure 105/44 on admission, pulse 53.  Respirations 20.  Temperature 97.4.  Sats 99% on 2 liters.  GENERAL:  A 77 year old female in no apparent distress.  HEENT:  Normocephalic, atraumatic.  NECK:  Reveals bilateral carotid pulses without bruits.  No JVD.  LUNGS:  Clear  to auscultation.  HEART:  Regular rate and rhythm (S1, S2).  No murmurs, rubs or gallops.  ABDOMEN:  Protuberant, nontender.  EXTREMITIES:  Intact distal pulses without significant edema.  NEUROLOGICAL:  No focal deficits.   ADMISSION CHEST X-RAY:  Pending.   ADMISSION ELECTROCARDIOGRAPHY:  Sinus bradycardia, 57 BPM plus normal axis  and nonspecific ST markers.   LABORATORY DATA:  Pending.   IMPRESSION:  [name] is a 77 year old female, with history of  nonobstructive coronary artery disease and normal left ventricular function  by cardiac catheterization in September 2006 with history of atrial  arrhythmias status post RF ablation for treatment for both atrial flutter  and right-sided atrial tachycardia, and who has chronic vertigo.  She  presents with an episode of sudden light-headedness followed by acute left  shoulder/arm and chest pain after taking one meclizine tablet.   The patient was found to be hypotensive in our office, was placed on IV  hydration, and now presents to the emergency room with complete resolution  of her symptoms and improvement of her systolic pressure to the 915-056  range.   PLAN:  The patient will be admitted for further evaluation of atypical chest  pain with serial cardiac markers.  Will also monitor for any possible  dysrhythmia.  We will cycle cardiac markers and continue IV hydration to  maintain adequate blood pressure.  Given her low blood pressure and current  low heart rate, we will place on hold both atenolol and Sular for the time  being.  If patient rules out for myocardial infarction, then plan is to  discharge her home in the morning.      Gene Serpe, P.A. [facility]      Scarlett Presto, M.D.  Electronically Signed    GS/MEDQ  D:  01/31/2005  T:  01/31/2005  Job:  [id number]

## 2010-05-25 NOTE — Discharge Summary (Signed)
[name], [name]                   ACCOUNT NO.:  [id number]   MEDICAL RECORD NO.:  [id number]          PATIENT TYPE:  INP   LOCATION:  3711                         FACILITY:  [facility]   PHYSICIAN:  Sharyl Nimrod, P.A. [facility] DATE OF BIRTH:  04-26-1932   DATE OF ADMISSION:  01/31/2005  DATE OF DISCHARGE:  02/01/2005                           DISCHARGE SUMMARY - REFERRING   ADMITTING PHYSICIAN:  Scarlett Presto, M.D.   DISCHARGE PHYSICIAN:  Ernestine Mcmurray, M.D. [facility].   [name] is a 77 year old white female who is admitted through the emergency  room with an episode of dizziness.  She stated while sitting in the lobby  for a scheduled mammogram.  She suddenly became very dizzy.  She took a  tablet of meclizine for which she usually takes for Vertigo.  However this  was followed by left shoulder and arm discomfort associated with shortness  of breath and diaphoresis.  She felt that the symptoms were similar to her  cardiac catheterization in 09/06.  She was triaged by Dr. Daryel November in the  office.  Her blood pressure was in the 70's thus she was placed on fluids  and transported to the hospital for admission.   PAST MEDICAL HISTORY:  1.  Nonobstructive coronary artery disease from cath in September 2006.  2.  Normal LV function status post ablation for atrial flutter April 2004.  3.  Status post ablation for right-sided atrial tachycardia March 2006.  4.  Hypothyroidism.  5.  Vertigo.  6.  Gastroesophageal reflux disease with antral ulcer.  7.  Hypertension.  8.  Hyperlipidemia.  9.  Fibromyalgia.  10. Obstructive sleep apnea.  11. Transient ischemic attack, previously on Coumadin.  12. Lower back pain.  13. Fatty liver.  14. Status post cholecystectomy.  77. Tuberculosis in 1952.  16. Remote tobacco use in 1962.   LABORATORY DATA:  Chest x-ray on 01/25 shows sub segmental atelectasis or  conceivably early infiltrate in the right lower lobe.  Admission H and H was  14.7 and 43.0.   Normal indices platelets 216, WBC of 8.8.  PTT is 26, PT  13.0.  Sodium 142, potassium 5.2 , BUN 16, creatinine 1.1.  LFT's were  slightly elevated at 87 and 55 in regards to her AST and ALT.  CK-MB and  troponins x 2 were negative for myocardial infarction.  Fasting lipids  showed a total cholesterol of 148, triglycerides of 240, HDL 39, LDL 61.  TSH was low at .130.   EKG showed normal sinus rhythm.  Normal axis.  Normal R-waves with no acute  changes.   HOSPITAL COURSE:  [name] was admitted to unit 3700.  She was placed on  home medications except for atenolol and Sular.  These were held secondary  to hypotension.  After receiving fluids over night, her blood pressure had  stabilized as she had not had any further dizziness.  Orthostatic blood  pressures were performed and this showed no significant drop.  However this  was noted that they were not performed at 1, 3 and 5 minutes  standing.  Her  Synthroid was decreased at .1 mg since her TSH was low and we rechecked  her  potassium prior to discharge.  After review, Dr. Dannielle Burn felt that she could  be discharged home.   DISCHARGE DIAGNOSES:  1.  Near syncope, secondary to hypotension.  2.  Atypical chest discomfort with a history of cardiac catheterization on      October 03, 2004 that showed a 30% proximal LAD with an EF of 55%.  3.  Sinus bradycardia associated with number 1.  4.  Hypokalemia of uncertain etiology.  5.  Elevated LFT of uncertain etiology.  6.  Decreased TSH with a history of hypothyroidism and on Synthroid prior to      admission.  History as noted above.   DISPOSITION:  [name] was discharged home.  She was asked to maintain low  salt, low fat, cholesterol diet.  Her activities are not restricted.  She  was asked to bring all medications and all home blood pressures to all  appointments.  She was asked not to take her [place] Gum however to continue to  take her blood pressure.  If her blood pressure arose to  over 448  systolically.  She was asked to resume her Sular at a decreased dose at 10  mg q.h.s.  I have written a new prescription of Synthroid .1 mg q.d. for Ms.  Hayes.   She is asked to continue her other medications which include:  1.  Protonix 40 mg q. day.  2.  Neurontin 300 mg b.i.d.  3.  Serapex .25 mg q.h.s.  4.  Flonase b.i.d.  5.  Zyrtec 10 mg q. day.  6.  Vytorin 10/40 q.h.s.  7.  Singulair 10 mg q.h.s.  8.  Astelin 2 x day.  9.  Meclizine 25 mg p.r.n.  10. Aspirin 81 mg p.r.n.  11. Darvocet p.r.n.  12. Lidoderm patch p.r.n.  13. Atenolol 50 mg q. day.   She was asked to keep her 4-6 week appointment with Dr. Lenna Gilford for follow up  on her hypothyroidism.  Consideration should also be given to repeat her  LFT's given the mild elevation during her hospitalization.  She will follow  up with Dr. Arlina Robes PA, Sharyl Nimrod on February 8th at 9:45.      Sharyl Nimrod, P.A. [facility]     EW/MEDQ  D:  02/01/2005  T:  02/01/2005  Job:  [id number]   cc:   Scarlett Presto, M.D.  Fax: [phone]   Deboraha Sprang, M.D.  [address] [place]  [place] [address]

## 2010-05-25 NOTE — H&P (Signed)
[facility]  Patient:    [name], [name]"                    MRN: [id number] Adm. Date:  03/28/00 Attending:  Kipp Brood. Gladstone Lighter, M.D. Dictator:   Elodia Florence. Clabe Seal, P.A. CCDeborra Medina. Lenna Gilford, M.D. [facility]   History and Physical  DATE OF BIRTH:  July 26, 1916  CHIEF COMPLAINT:  "Pain in my right shoulder."  HISTORY OF PRESENT ILLNESS:  This 77 year old lady who has been seen by Jori Moll A. Gioffre, M.D. for continuing and progressive problems concerning her right shoulder. She has had a repair of the rotator cuff on the left some years back. Unfortunately, her right shoulder presents with much the same pain and discomfort. She now has difficulty with night pain, pain with day-to-day activities, and in reaching from the right shoulder. We have tried steroid injections into the shoulder which really have not helped very much. MRI has shown a complete tear of the rotator cuff of the right shoulder with a full thickness and partially retracted tear. Due to these positive findings, the fact that the patient has not responded to conservative care, it is felt she would benefit from surgical intervention and being admitted for repair of the rotator cuff of the right shoulder.  PAST SURGICAL HISTORY:  Is extensive. Tonsillectomy and adenoidectomy in 1956; appendectomy in 1970; breast biopsies 1974, 1976, 1992; a D&C in 1974; uterine cysts in 1995, 2001. Repair of hallux valgus right toe in 1980; colon polyps removed via colonoscopy 1987, 1988; bone spur removed from the right great toe 1988 and 1996; endometrial biopsy in 1993 and 2001; arthroscopic repair of a meniscus of the right knee in 1995; left rotator cuff repair in 1995; cholecystectomy in 1996; heel spur excision and tendon release right heel 1997; heel spur and tendon release of the left heel in 1998; left eye cataract surgery with lens implant in 1999; sinus surgery for chronic sinusitis  1989, 1993; right maxillary surgery in 2001.  PAST MEDICAL HISTORY:  History of tuberculosis; possible TIAs in 1990, 1992; history of vascular headaches, migraines; hyperlipidemia; possible petit mal seizures; chronic back pain; fibromyalgia; GERD. The patient also has a history of chronic sinusitis cultured out as Pseudomonas, Staphylococcus, Haemophilus influenzae, and Eikenella corrodens.  CURRENT MEDICATIONS:  1. Zocor 40 mg one q.d.  2. FemHRT 1 mg q.d.  3. Depakote 250 mg b.i.d.  4. Synthroid 0.1 mg q.d.  5. Meclizine 25 mg p.r.n. vertigo, dizziness.  6. Darvocet p.r.n. pain.  7. Aspirin 81 mg q.d. Stop March 14 prior to surgery.  8. Ibuprofen p.r.n. Stop March 14 prior to surgery.  9. Flonase 2 daily each nostril. 10. Allegra D 1 daily in the a.m. 11. Allegra 60 one daily at noon.  ALLERGIES:  DEMEROL, DILAUDID, and CODEINE. These cause nausea and vomiting, as well as MORPHINE SULFATE given at her last admission. She is allergic to BACITRACIN as well, and ALL TAPES other than paper tape. She states that Marcaine, Aristospan, and Xylocaine are not affective for local anesthetic. Nesacaine and chloroprocaine are used. She also takes numerous supplements.  Her family physician is Deborra Medina. Lenna Gilford, M.D. who will fax to [place] approval for surgery.  SOCIAL HISTORY:  The patient is married. Neither smokes nor drinks.  FAMILY HISTORY:  The patient has no medical history of father. Mother had a stroke and ulcer, as well as hypertension.  REVIEW  OF SYSTEMS:  CNS:  The patient does have migraine headaches and vascular headaches, as well. She states she has had TIAs in the past and possible petit mal strokes. CARDIOVASCULAR:  No chest pain, angina, or orthopnea. RESPIRATORY: No productive cough. No hemoptysis. She does have some postnasal drip occasionally. GASTROINTESTINAL:  No nausea, vomiting, melena, or bloody stools. GENITOURINARY:  No discharge, dysuria, or  hematuria. MUSCULOSKELETAL:  Primarily in present illness.  PHYSICAL EXAMINATION:  GENERAL:  Alert and cooperative, friendly, slightly apprehensive, 77 year old white female.  VITAL SIGNS:  Blood pressure 150/66, pulse 80, respirations are 12.  HEENT:  Normocephalic. PERRLA. Wears glasses. Oropharynx is clear.  CHEST:  Clear to auscultation. No rhonchi. No rales.  HEART:  Regular rate and rhythm. No murmurs are heard.  ABDOMEN:  Soft, obese, nontender. Liver, spleen not felt.  GENITALIA/RECTAL/PELVIC/BREASTS:  Not done. Not pertinent to present illness.  EXTREMITIES:  Right shoulder as in present illness above, as well as painful range of motion particularly on abduction. She has weakness to resistance against abduction.  ADMISSION DIAGNOSES:  1. Torn rotator cuff of the right shoulder, full thickness.  2. History of tuberculosis.  3. Diverticulosis.  4. Gastroesophageal reflux disease.  5. Hyperlipidemia.  6. Possible transient ischemic attacks and petit mal seizures.  7. Chronic back pain.  8. Fibromyalgia.  9. Multiple medical allergies.  PLAN:  The patient will be admitted for repair of the rotator cuff of the right shoulder. As she is sensitive to Demerol, Dilaudid, and codeine, as well as morphine sulfate which causes severe nausea and vomiting, I have discussed this with her, and she says that she would take Darvocet for her pain and discomfort. She may need an extra day in the hospital due to her medical history. If she has any problems, we will certainly ask Deborra Medina. Lenna Gilford, M.D. to follow along with [place] during the patients hospitalization. DD:  03/20/00 TD:  03/20/00 Job: [id number]

## 2010-05-25 NOTE — Discharge Summary (Signed)
NAMEYOSHIE, [name]                   ACCOUNT NO.:  [id number]   MEDICAL RECORD NO.:  [id number]          PATIENT TYPE:  INP   LOCATION:  5710                         FACILITY:  [facility]   PHYSICIAN:  Deborra Medina. Lenna Gilford, M.D. [facility] OF BIRTH:  October 03, 1932   DATE OF ADMISSION:  12/24/2004  DATE OF DISCHARGE:  12/26/2004                                 DISCHARGE SUMMARY   FINAL DIAGNOSES:  1.  Admitted with needed with left lower quadrant and diffuse abdominal pain      of uncertain etiology. Associated with nausea and diarrhea.      Gastrointestinal consult Dr. Laurence Spates this hospitalization with      negative CT scan and improvement with conservative therapy.  2.  History of chronic diarrhea, hyperplastic polyp, and a few erosions at      the cecum on previous colonoscopy.  3.  History of gastroesophageal reflux disease and previous antral ulcer.  4.  Status post cholecystectomy.  5.  Fatty infiltration of the liver.  6.  History of hypertension-controlled on medications.  7.  History of atrial flutter-status post radiofrequency ablation in 2005 by      Dr. Lovena Le.  8.  History of allergic rhinitis sinusitis.  9.  History of obstructive sleep apnea on C-PAP by Dr. Jannifer Franklin.  10. History of transient ischemic attack previously on Coumadin followed by      Dr. Jannifer Franklin.  11. History of hyperlipidemia followed in the clinic.  12. History of hypothyroidism and hypercalcemia seen by Dr. Loanne Drilling for      endocrinology.  13. History of fibromyalgia.  14. History of low back pain with orthopedic evaluation by Dr. [name] and Dr.      [name].  15. History of multiple medical sensitivities and reactions.   BRIEF HISTORY AND PHYSICAL:  The patient is a 77 year old white female with  multiple medical issues. She was admitted on December 24, 2004 for  evaluation of worsening abdominal pain. She states she had months of  abdominal discomfort initially in the left lower quadrant and two days prior  to  admission localizing in the right lower quadrant but present all over as  well. She rated it a 7 to 8/10 and said it was progressing. It was  associated with gas spasm, some nausea and loose stools without blood. She  did not feel she could make it at home any longer. She had previously seen  Dr. Sharlett Iles as her gastroenterologist. He has treated her for multiple  problems as noted below. She requested a second opinion consultation Dr.  [name] and his team. She has a history of gastroesophageal reflux  disease with endoscopy in April 2006 showing a 3 cm hernia and a 3 mm antral  ulcer and biopsies were negative for H. pylori. She had small bowel biopsy  as well. It was negative for celiac disease. She has a history of chronic  diarrhea and colonoscopy in April 2006 showed a small hyperplastic polyp and  a granular cecum with superficial erosions, suggesting possible ischemic  colitis. She was given a trial  of Welchol but tolerated this poorly and  states that Questran was without help. She has a history of previous  cholecystectomy. She has elevated liver enzymes in the past with marked  fatty infiltration of liver on previous echocardiogram in May of this year.  She was admitted for follow-up CT of the abdomen and GI consultation with  the worsening abdominal symptoms.   PAST MEDICAL HISTORY:  She has a history of hypertension controlled on  medications. She has a history of atrial flutter with previous radio  frequency ablation in May of 2005 by Dr. Dannielle Burn, Dr. Caryl Comes and Dr. Lovena Le.  She had normal adenosine Myoview study in March 2006 and a catheterization  in September of 2006 that showed just some mild nonobstructive disease with  a 30% LAD lesion, otherwise negative coronaries and an EF of 65% at that  time. She has a history of allergies and sinusitis followed by Dr. [name]  and Dr. [name] for ENT. She has a history of TIA in the past and was  previously on Coumadin and followed  Dr. Jannifer Franklin. Coumadin was discontinued in  April 2006. She has a history of a seizure disorder in the past. Dr. Jannifer Franklin  did a sleep study, diagnosed obstructive sleep apnea and placed her on C-PAP  nine which he continues to follow for the patient. She has a history of old  TB in the 34s. She has a history of hyperlipidemia and has had difficulty  with medications and is currently followed in the [facility]id Clinic. She has a  history of hypothyroidism, hypercalcemia and has had an endocrine evaluation  by Dr. Loanne Drilling. She has a history of fibromyalgia and has seen Dr.  [name]. She has a history of low back pain and mild osteopenia and is  followed by Dr. [name] and Dr. [name]. She states that she needs surgery on  her back for multilevel degenerative disk disease and spinal stenosis.   She has a history of multiple medical sensitivities and reactions and is  intolerant to all NSAIDs. Her list of other medical intolerances include  DEMEROL, DILAUDID, MORPHINE and CODEINE. She is also intolerant to TEGRETOL,  ULTRAM, INDERAL, PROZAC.   PHYSICAL EXAMINATION:  Physical examination at the time of admission  revealed an obese 77 year old white female in no acute distress. Blood  pressure 120/60, pulse 74 and regular, respirations 20 per minute and not  labored, temperature 98.4. HEENT exam unremarkable. Neck exam showed no  jugular distension, no carotid bruits, no thyromegaly or lymphadenopathy.  Chest exam was clear percussion, auscultation. Cardiac exam revealed a  regular rhythm, grade 1/6 systolic ejection murmur at the left sternal  border. No rubs or gallops heard. Abdomen was obese but soft with intact  bowel sounds. He was diffusely tender on palpation and tracked more so in  the right lower quadrant. Extremities showed no cyanosis, clubbing or edema.  She does have venous insufficiency. She has multiple trigger points. Neurologic was intact without focal abnormalities detected. Skin exam  was  negative.   LABORATORY DATA:  EKG showed normal sinus rhythm and was felt to be within  normal limits. A three-way abdomen showed some atelectasis in the right  middle lobe and nonobstructive bowel gas pattern. No free air. CT scan of  the abdomen and pelvis showed fatty infiltration of the liver without focal  abnormalities; atheromatous abdominal aorta; evidence of previous  cholecystectomy; sigmoid diverticulosis without CT evidence of  diverticulitis or abscess.   Hemoglobin 13.8, hematocrit 39.5, white  count 7700 with normal differential.  Sed rate 27. Pro time 13.2, INR 1.0, PTT 29 seconds. Sodium 142, potassium  4.5, chloride 108, CO2 30, BUN 8, creatinine 0.8, blood sugar 95, calcium  10.5, total protein 6.1, albumin 3.3. AST 37, ALT 45, alk phos 87, total  bilirubin 0.7. CPK 56 with negative MB and negative troponin. TSH 1.33.  Stool studies were negative including C. Difficile, stool culture, Giardia  screen, and stool for WBCs.   HOSPITAL COURSE:  The patient was admitted, placed on IV fluids for CT of  the abdomen and given Levsin for GI spasm. The CT scan returned as above  without evidence of acute diverticulitis. She did complain of sinus drainage  with green mucus and had a temperature of 99. She was started on Avelox for  sinusitis and given Mucinex as well. She improved rapidly in all spheres  with less abdominal discomfort. She did receive one dose of Zofran for  nausea. Dr. Oletta Lamas felt that her diet could be advanced and that she should  follow-up with Dr. [name] in the office in three to four weeks post  discharge.   MEDICATIONS AT DISCHARGE:  1.  Avelox 400 milligrams 1 tablet p.o. daily until gone.  2.  Mucinex 1-2 tablets p.o. b.i.d. with plenty of fluids. A low residue      diet.  3.  She was instructed to continue her home medications which include      atenolol 50 milligrams p.o. daily.  4.  Protonix 40 milligrams p.o. daily.  5.  Neurontin 300  milligrams p.o. b.i.d.  6.  Mirapex 0.25 milligrams p.o. q.h.s.  7.  Sular 20 milligrams p.o. daily.  8.  Flonase 1 spray in each nostril b.i.d.  9.  Zyrtec 10 milligrams p.o. daily.  10. Vytorin 10/40 1 tablet p.o. q.h.s.  11. Synthroid 0.125 milligrams p.o. daily.  12. Singulair 10 milligrams p.o. daily.  13. Astelin nasal spray once previous nostril twice a day.  14. Multivitamin daily.  15. Darvocet 1 tablet every 4 hours as needed for pain.  16. Meclizine 25 milligrams p.o. q.4h. p.r.n. dizziness.  17. Aspirin 81 milligrams p.o. daily.  18. Lidoderm patches as needed.   CONDITION ON DISCHARGE:  Improved. She will follow-up in the office in two  weeks and Dr. Amedeo Plenty after that.      Deborra Medina. Lenna Gilford, M.D. [facility]  Electronically Signed     SMN/MEDQ  D:  12/26/2004  T:  12/27/2004  Job:  [id number]   cc:   Jeneen Rinks L. Rolla Flatten., M.D.  Fax: [phone]

## 2010-05-25 NOTE — H&P (Signed)
NAMERHEAGAN, NAYAK                   ACCOUNT NO.:  [id number]   MEDICAL RECORD NO.:  [id number]          PATIENT TYPE:  INP   LOCATION:  1419                         FACILITY:  [facility]   PHYSICIAN:  [name], M.D.     DATE OF BIRTH:  02-19-1932   DATE OF ADMISSION:  10/02/2004  DATE OF DISCHARGE:                                HISTORY & PHYSICAL   PRIMARY CARE PHYSICIAN:  Rebekah Medina. Lenna Gilford, M.D.   PRIMARY CARDIOLOGIST:  Rebekah Mcmurray, M.D.   CHIEF COMPLAINT:  Chest pain.   HISTORY OF PRESENT ILLNESS:  [name] is a 77 year old female with no  history of coronary artery disease who had onset of left shoulder pain that  radiated over to the substernal part of her chest.  It also went down her  left arm.  The symptoms started today while she was riding in a car.  She  was on her way to see [name] and went to his office.  He felt that she  should be sent to the emergency room, and in the emergency room she was  given sublingual nitroglycerin which resolved her pain.  When the pain  returned, she was given an additional sublingual nitroglycerin, and is  currently pain free.  Cardiologist was asked to evaluate her.   [name] states that the last time she had this pain was approximately 10  years ago.  At that time, her symptoms were secondary to a gallbladder  attack, and her gallbladder was removed.  She has significant reflux  symptoms, but her reflux symptoms are very different from these symptoms,  and she says there is no similarity whatsoever.  Except for that 1 episode  years ago that was secondary to her gallbladder, she has not had this type  of chest pain.  She had diaphoresis with it, but no shortness of breath,  nausea, or vomiting.   PAST MEDICAL HISTORY:  1.  She has a history of atrial flutter status post ablation and atrial      tachycardia status post ablation in May of 2006.  She has had 2 chemical      stress tests, probably adenosine, (the last one was in May of  2006)      which were negative.  2.  She has a history of hypertension.  3.  Hyperlipidemia.  4.  Family history of coronary artery disease and obesity.  5.  She has a history of diverticulosis and diverticulitis.  6.  She has frequent reflux symptoms.  7.  She has a history of NSAID-induced gastric ulcer.  8.  Hiatal hernia.  9.  Hypothyroidism.  10. Secondary hyperparathyroidism.  11. She had tuberculosis in 1962.  12. History of fibromyalgia.  13. Obstructive sleep apnea.  14. She had a transient global amnesia episode that lasted several hours in      June 2004.   SURGICAL HISTORY:  1.  She is status post ablation x2.  2.  Cholecystectomy.  3.  Epidural and facet injections in her back.  4.  Colonoscopy.  5.  Multiple orthopedic surgeries.   SOCIAL HISTORY:  She lives in [place] with her husband, and quit tobacco  in 1962.  She is a retired Marine scientist and does not abuse alcohol or drugs.  Because of her back problem, she does not exercise either.   FAMILY HISTORY:  Her father died at age 1 with heart failure after surgery.  Her mother died at age 36 of a stroke.  She had 1 sister who died of a  pulmonary embolus at age 70, and another sister who is alive with  schizophrenia.   ALLERGIES:  She is allergic or intolerant to -  1.  BACITRACIN.  2.  MORPHINE.  3.  CODEINE.  4.  DILAUDID.  5.  DEMEROL.  6.  ADVIL.   CURRENT MEDICATIONS:  1.  Neurontin 300 mg one q.a.m., one at noon, and two tablets nightly.  2.  Flomax and Astelin b.i.d.  3.  Zyrtec 10 mg daily.  4.  Singulair 2 mg nightly.  5.  Synthroid 125 mcg daily.  6.  Protonix 40 mg q.a.m.  7.  Vytorin 10/40 q.p.m.  8.  Mirapex 0.25 nightly.  9.  Aspirin 81 mg daily.  10. Sular 20 mg daily.  11. Atenolol 50 mg daily.  12. Meclizine p.r.n.  13. Darvocet p.r.n.  14. Lidoderm patches daily.  15. Fentanyl patches.  16. Multivitamins.  17. B Complex with flavonoid __________.  18. Glucosamine  supplements.   REVIEW OF SYSTEMS:  Significant for chronic diarrhea.  She has had increased  abdominal pain and diarrhea recently.  She has chronic back problems.  She  has sinus problems.  Her last thyroid function testing was done in January  of 2006.  She has a history of fatty liver deposits and abnormal LFT's, and  because of this her Vytorin was decreased from 1080 to 1040.  She is  followed in the lipid clinic.  She denies any hematemesis, hemoptysis, or  melena.  Review of systems is otherwise negative.   PHYSICAL EXAMINATION:  VITAL SIGNS:  Temperature 97.2, blood pressure  initially 150/68, and after sublingual nitroglycerin x2 was 85/46, and now  124/62.  Heart rate 70, respiratory rate 16, O2 saturation 97% on room air.  GENERAL:  She is a well-developed, obese white female in no acute distress.  HEENT:  Her had is normocephalic and atraumatic.  Pupils equal, round and  reactive to light and accommodation.  Extraocular movements intact.  Throat  is clear.  Nares without discharge.  NECK:  There is no JVD.  No carotid bruits are appreciated.  No thyromegaly  is noted.  CHEST:  Clear to auscultation bilaterally with good bilateral extension.  CARDIOVASCULAR:  Her heart is regular in rate and rhythm, with no  significant murmur, rub, or gallop noted.  ABDOMEN:  Obese, soft, with diffuse tenderness and active bowel sounds.  EXTREMITIES:  There is no edema noted.  She has 2+ pulses in all 4  extremities.  No femoral bruits are appreciated.  MUSCULOSKELETAL:  There are no joint deformities or effusions.  She has no  spine or CVA tenderness.  SKIN:  No rashes or lesions are noted.  NEUROLOGIC:  She is alert and oriented with no focal deficits noted.   EKG:  Sinus rhythm.  No acute changes.   CHEST X-RAY:  Pending at the time of dictation.   LABORATORY VALUES:  Hemoglobin 15.7, hematocrit 45.9, WBCs 11.3, platelets 294.  Point of care markers are negative.  Sodium 137,  potassium 4.5,  chloride 106, CO2 of 25, BUN 19, creatinine 0.9, glucose 103.  Urinalysis  was negative.   IMPRESSION:  1.  Chest pain . At this point, there is no proven myocardial infarction.      She has multiple cardiac risk factors, and her Cardiolite in March of      2006 was within normal limits.  I am not sure a stress test would help      [place] now, as she intends to be very active in the near future, and be      traveling out of the state for approximately 2 weeks.  It is also      concerning that her symptoms started at rest and were relieved with      sublingual nitroglycerin.  We will, therefore, admit to the hospital and      continue to cycle enzymes to rule out myocardial infarction.  A 2B3A      inhibitor is indicated if her cardiac enzymes are positive for      myocardial infarction.  Otherwise, she will be started on IV      nitroglycerin and heparin and scheduled for cardiac catheterization      tomorrow at [facility].  2.  Hypertension.  She will be continued on her home medications with      adjustments made on a p.r.n. basis.  3.  Hyperlipidemia.  She has had no recent lipid profile, so will check one      in the hospital.  4.  Hypothyroidism.  It has been almost a year since she had thyroid      function testing done, so we will check a TSH and forward those results      to Dr. Lenna Gilford and consult him if any medication adjustments are felt      needed.      [name], P.A. [facility]    ______________________________  [name], M.D.    RB/MEDQ  D:  10/02/2004  T:  10/03/2004  Job:  [id number]

## 2010-05-25 NOTE — H&P (Signed)
[name], [name]                   ACCOUNT NO.:  [id number]   MEDICAL RECORD NO.:  [id number]          PATIENT TYPE:  INP   LOCATION:  5033                         FACILITY:  [facility]   PHYSICIAN:  Jessy Oto, M.D.   DATE OF BIRTH:  [birth year]   DATE OF ADMISSION:  05/21/2005  DATE OF DISCHARGE:                                HISTORY & PHYSICAL   CHIEF COMPLAINT:  Low back pain with increasing lower extremity weakness and  pain with ambulation.   HISTORY OF PRESENT ILLNESS:  The patient is a 77 year old female who has  been evaluated in the past for problems of lower back pain with radiation  into her legs with standing and with walking.  She has been treated  conservatively for years and has gotten to the point where she is unable to  ambulate even a short distances without severe back and lower extremity  pain.  She has undergone use of TENS unit, Lidoderm patches. She has had  previous injection treatments, all without relief of pain other than  temporization. Her report of her MRI study has demonstrated severe facette  arthritis changes at L3-4 with flavum thickening and disk bulging causing  marked central canal stenosis at this level and severe arthritic changes of  the facettes at L4-5 with bulging disk causing moderately severe central  stenosis and marked left foraminal narrowing at the C4-5 level.  Post  myelogram CT scan demonstrating significant multifactorial spinal stenosis  at L4-5 with possible central protrusion at this level, bilateral L5 neural  foraminal encroachment.  L4-5 significant spinal stenosis due to posterior  element hypertrophy,  central protruded disk bilateral L4 nerve root  entrapment.  Because of persistence of pain and discomfort, increasing  neurogenic claudication, this patient is brought to the operating room today  and is being admitted for surgery.   ALLERGIES:  BACITRACIN. She is some intolerant of Demerol, Dilaudid,  morphine and  codeine.   MEDICATION LIST:  1.  Neurontin 300 mg in the morning and at noon time, two 300 mg tablets at      night.  2.  Flonase b.i.d. each nostril.  3.  Astelin b.i.d. each nostril.  4.  Zyrtec 10 mg q.a.m.  5.  Singulair 10 mg p.o. q.h.s.  6.  Synthroid 112 mcg taken daily at lunch.  7.  Protonix 40 mg before breakfast and 40 mg at night.  8.  Vytorin 10 mg  9.  Zetia 40 mg.  10. Zocor 40 mg h.s.  11. Mirapex 0.25 mg h.s. for restless legs.  12. Aspirin 81 mg p.o. q.d. with dinner.  13. Sular 10 mg p.o. q.h.s.  14. Atenolol 50 mg p.o. q.d.  15. Meclizine 25 mg p.r.n. vertigo or motion sickness.  16. Darvocet-N 100 for back day.  17. Lidoderm patches p.r.n. back pain.  18. Ibuprofen as been discontinued due to stomach irritation.   She has intolerance to most nonsteroidal anti-inflammatory agents.  She has  had a blurred vision and side effects with Indocin, Inderal, Prozac,  Tegretol, Voltaren, Ultram. No relief  with motrin, Relafen, Daypro and  Celebrex.  Fentanyl patch caused chest pain, nausea and vertigo.   Also on a daily basis, she takes supplements of multivitamin tablets, C  complex with bioflavonoids psyllium 2 daily, omega vitamins, glucosamine,  chondroitin sulfate, guaifenesin, and Levbid 0.375 mg b.i.d. for abdominal  pain.   PAST MEDICAL HISTORY:  Significant for hyperlipidemia, history of migraines,  vascular headaches, bone spur removed from the great toe.  She has had a  history of polyps.  She has had a history of chronic back pain since  January1998.  Fibromyalgia. Cataracts.   PAST SURGICAL HISTORY:  Has included appendectomy. She has had breast  biopsies, multiple in the past, D&C in the past, repair of foot hallux  valgus, colonoscopy with removal of colon polyps.  She had some surgery for  a knee, repair of rotator cuff, both left side and right side.  Cholecystectomy.  Heel spurs removed from both heels.  She has had previous  cataract surgery  both eyes 1999, sinus surgery in 1989.  Urethral support  sling [id number].  Sleep apnea has been diagnosed in the past, discontinued  in September2006. Cardiac catheterization recently with ablation for atrial  tachycardia May 2006 and April 2005. Cardiac catheterization September2006.  She had a wrist fracture August2005, colonoscopy, endoscopy April of 2006,  hiatal hernia, stomach ulcer, diverticulosis, colitis, polyps.   FAMILY HISTORY:  Significant for congestive heart failure.  Father died at  age 78, mother with a stroke died at age 44 with bleeding ulcer.  Sister  passed away at age 30 with pulmonary embolism.  The patient has been seen by  Dr. Teressa Lower, evaluated, felt to be stable for surgical intervention.   REVIEW OF SYSTEMS:  Indicates respiratory symptoms negative.  No recent  cough, fever, chills, no hemoptysis. No blurring of vision, no sore throat,  no nausea, vomiting, diarrhea.  She does have a history of GI troubles in  the past that have been treated by Dr. Amedeo Plenty. Diverticulosis, colitis and  ulcer disease.  No bright red blood per rectum.  No hematochezia. She does  notice weakness in the legs, numbness with standing and ambulation  consistent with her present diagnosis of lumbar spinal stenosis.   PHYSICAL EXAMINATION:  VITAL SIGNS:  Showed a temperature of 98.6, pulse was  60, respirations 16, blood pressure 140/75.  GENERAL:  She is alert, oriented x4, obese, white female in no acute  distress.  HEENT:  Pupils equal and reactive light and accommodation.  Extraocular  muscle movements are intact.  Ears clear.  NECK:  Supple with full range of motion.  No thyromegaly, no masses noted.  No lymphadenopathy.  CHEST:  Clear to auscultation.  BREASTS:  Exam was referred. Dr. Carren Rang, her primary care physician and  OB/GYN, evaluated 2 months ago.  HEART: Regular rate and rhythm without murmur, rub or gallop. ABDOMEN:  Obese, nontender, positive bowel sounds.  No  organomegaly.  No  masses felt.  The patient is quite obese though.  GU: Female Dr. Carren Rang is her primary care and performed an internal  evaluation 2 months ago.  EXTREMITIES:  Motor is intact. Cranial nerves within normal limits  throughout. EHL slightly weak both lower extremities.  SKIN:  Normal with no masses and no skin lesions noted.   PREOPERATIVE STUDIES:  MRI demonstrated moderate spinal stenosis L4-5 severe  at L3-4. Myelogram demonstrated significant lumbar spinal stenosis at both  L4-5 and L3-4 affecting bilateral L4 and bilateral L5  nerve roots.   IMPRESSION:  1.  Lumbar spinal stenosis, severe L3-4, L4-5.  Neurogenic claudication is      worsening over the past 1/2 year.  2.  Obesity.  3.  Hyperlipidemia.  4.  Atrial arrhythmia status post ablation x2.  5.  Diverticulosis.  History of colitis and ulcer in the past.  6.  History of seizure disorder.  7.  Hypertension.  8.  Sleep apnea.  9.  Migraine.  10. History of benign breast biopsies.  11. Obesity  12. Multiple medication allergies particularly to narcotic pain medication      making post surgical treatment of pain difficult.   PLAN:  The patient is admitted to undergo a central decompressive  laminectomy at L3-4, L4-5. Postoperative pain management to include the use  of Toradol IV and a fentanyl IV drip.      Jessy Oto, M.D.  Electronically Signed     JEN/MEDQ  D:  05/24/2005  T:  05/24/2005  Job:  [id number]

## 2010-05-25 NOTE — Discharge Summary (Signed)
NAME:  [name], DETTMANN                             ACCOUNT NO.:  [id number]   MEDICAL RECORD NO.:  [id number]                   PATIENT TYPE:  OIB   LOCATION:  6283                                 FACILITY:  [facility]   PHYSICIAN:  Deboraha Sprang, M.D.               DATE OF BIRTH:  31-Jan-1932   DATE OF ADMISSION:  05/06/2003  DATE OF DISCHARGE:  05/07/2003                                 DISCHARGE SUMMARY   PROCEDURE:  Radiofrequency catheter ablation of atrial flutter.   HOSPITAL COURSE:  [name] is a 77 year old female with a history of  hypertension, sleep apnea, and tachypalpitations.  Electrocardiogram  performed during tachypalpitations while she was visiting in [place] was  read as atrial fibrillation and flutter.  She was referred back to her  primary care physician, Dr. Deborra Medina. Lenna Gilford and saw Dr. Deboraha Sprang.  He  felt that she had atypical atrial flutter with regular flutter waves and the  best option was ablation.  She was admitted for this May 06, 2003.   The atrial flutter was successfully ablated and she tolerated the procedure  well.  She was placed on telemetry overnight.  On May 07, 2003, she was  maintaining sinus rhythm and had no problems with any of the catheter sites.  She was considered stable for discharge on May 07, 2003 with outpatient  follow-up arranged.   CONDITION ON DISCHARGE:  Improved.   DISCHARGE DIAGNOSES:  1. Atrial flutter, status post radiofrequency catheter ablation.  2. Hypertension.  3. Obstructive sleep apnea.  4. Possible history of transient ischemic attack.  5. Left ventricular septal hypertrophy.  6. History of a negative Cardiolite two years ago.  7. Multiple surgeries including back surgery, heel surgery, rotator cuff,     and knee.  8. History of diverticulitis and colon polyps.  9. Dyslipidemia.  10.      Family history of pulmonary embolism.   ALLERGIES:  DEMEROL, DILAUDID, MORPHINE, CODEINE, BACITRACIN, INDOCIN,  INDERAL, PROZAC, TEGRETOL, VOLTAREN, and ULTRAM.   DISCHARGE INSTRUCTIONS:  1. Her activity level is to include no heavy lifting or strenuous activity     for four days.  2. She is to stick to a diet that is low fat, salt, and cholesterol.  3. She is to call if there are any problems.  4. She is to get a PT/INR on May 17, 2003 as scheduled.  5. She has an appointment Forest Becker, C.R.N.P. on June 17, 2003.   DISCHARGE MEDICATIONS:  1. Depakote 500 mg b.i.d.  2. Flonase each nares q.d.  3. Betapace 80 mg b.i.d.  4. Coumadin 5 mg tablets 7.5 mg today and then resume 5 mg q.d. except 7.5     mg on Monday, Wednesday, and Friday.  5. Clarinex 5 mg q.d.  6. Synthroid 125 mcg q.d.  7. Flexeril 10 mg q.h.s.  8. Xanax 0.5  mg q.h.s.  9. Nexium 40 mg q.d.  10.      Aspirin 81 mg q.d.  11.      Vytorin 10/40 mg q.d.  12.      Antibiotics before any procedures.      Rosaria Ferries, P.A. [facility]                  Deboraha Sprang, M.D.    RB/MEDQ  D:  05/07/2003  T:  05/07/2003  Job:  [id number]   cc:   Deborra Medina. Lenna Gilford, M.D. [facility]   Deboraha Sprang, M.D.

## 2010-05-25 NOTE — Assessment & Plan Note (Signed)
[place] NOTE   NAME:[name], [name]                          MRN:          [id number]  DATE:02/13/2006                            DOB:          1932-01-10    She was seen in the [facility] on February 13, 2006.  [name]  comes in today with no acute problems.  She has been tolerating her  cholesterol medicines which include Vytorin 10/40 once a day and Omega 3  fish oil capsules 1 gram twice daily.  Other medications have not  changed from her previous visit.  She has experienced some back pain  lately which she attributes to her mattress possibly.  She has also had  a sleep study and continues to have problems with insomnia and she is  seeing Dr. Brett Fairy for followup.   PHYSICAL EXAMINATION:  Weight 207 pounds, blood pressure 140/60, heart  rate 64.   LABORATORY DATA:  Total cholesterol 182, triglycerides  166, HDL 52.7,  LDL 96.  AST is 70, ALT is 55.   ASSESSMENT:  [name] has been tolerating her medicines.  Her LDL is a  little bit improved but not yet at goal less than 70.  Her HDL is  improved to 52.7 and is now at goal of greater than 45.  Her  triglycerides  are up slightly and are now not at goal of less than 150.  She has been continuing to follow a heart-healthy diet and concentrating  on decreasing portion sizes.  She rides a stationary bike for 20 minutes  at a time up to 5 days a week.  Liver function tests are slightly  elevated but this is a chronic problem and has not improved or worsened  with the decrease of Vytorin down from 10/80 to 10/40.   PLAN:  We are going to increase Vytorin to 10/40 once daily to hopefully  get the LDL down into the goal.  She is going to continue with her diet  and exercise as tolerated, and follow up with [place] in 8 weeks, and she was  instructed to call if any problems in the meantime.      [name], PharmD  Electronically  Signed      Rebekah Breeding, MD, [facility]  Electronically Signed   TP/MedQ  DD: 02/13/2006  DT: 02/13/2006  Job #: [id number]

## 2010-05-25 NOTE — Op Note (Signed)
NAMENASHLEY, [name]                   ACCOUNT NO.:  [id number]   MEDICAL RECORD NO.:  [id number]          PATIENT TYPE:  OIB   LOCATION:  2899                         FACILITY:  [place]   PHYSICIAN:  Deboraha Sprang, M.D.  DATE OF BIRTH:  01-03-1933   DATE OF PROCEDURE:  05/23/2004  DATE OF DISCHARGE:                                 OPERATIVE REPORT   PREOPERATIVE DIAGNOSIS:  Previous flutter ablation and atrial tachycardia  ablation now with recurrent atrial tachycardia.   POSTOPERATIVE DIAGNOSIS:  Previous flutter ablation and atrial tachycardia  ablation now with recurrent atrial tachycardia.   PROCEDURE:  Invasive electrophysiological study, electroanatomical mapping,  and radiofrequency catheter ablation.   DESCRIPTION OF PROCEDURE:  Following obtaining informed consent, the patient  was brought to the electrophysiology laboratory and placed on the  fluoroscopic table in the supine position.  After the patient was submitted  to general anesthesia under the care of Lorrene Reid, M.D., cardiac  catheterization was performed with local anesthesia.  At the end of the  procedure, catheters were removed, hemostasis was obtained, and the patient  was transferred to the recovery room in stable condition.   CATHETERS:  A 5 French quadripolar catheter was inserted via the left  femoral vein to the AV junction to measure the His electrogram.  A 6 French  octapolar catheter was inserted via the right femoral vein to the coronary  sinus.  A 10 French endocardial mapping array was inserted via the left  femoral vein to the mid right atrium.  An 8 French 5 mm deflectable tip  ablation catheter was inserted via the right femoral vein to mapping sites  in the posterolateral right atrial wall.   Surface leads 1, aVF, and V1 were monitored continuously throughout the  procedure.  Following insertion of the catheters the stimulation protocol  included incremental atrial pacing, single and double  atrial extrastimuli at  paced cycle length of 400 milliseconds.   RESULTS:  Surface electrocardiogram and basic intervals.  Rhythm is sinus;  RR interval is 797 milliseconds, PR interval was 128 milliseconds; QRS  duration 109 milliseconds; QT interval 453 milliseconds, P wave duration 110  milliseconds, bundle branch block - absent, preexcitation - absent; AH  interval 65 milliseconds; HV interval 48 milliseconds; His bundle duration  21 milliseconds.   Final rhythm is sinus; cycle length 718 milliseconds; PR interval 130  milliseconds; QRS duration 90 milliseconds; QT interval 400 milliseconds; P  wave duration 113 milliseconds.   AV nodal function:  AV nodal Wenckebach was at 380 milliseconds, AV nodal  conduction was discontinuous through inducible echobeats but no sustained  tachycardia.  Accessory pathway function; no evidence of accessory pathway  was identified.   Arrhythmias induced:  A neural QRS tachycardia was reproducibly induced with  catheter manipulation and spontaneous PAC's.  Endocardial array mapping  localized the tachycardia to the right lateral crista.  The site of early  activation was identified and a 1.5 cm circle around the early activation  was obliteration using multiple RF lesions.  These were preceded by pacing  to  make sure there was no diaphragmatic stimulation.  This eliminated the  tachycardia initially with slowing and rendered it noninducible.   Fluoroscopy time:  A total of 5 minutes and 43 seconds of fluoroscopy time  was utilized at 7.5 mg per second.   Radiofrequency energy:  A total of 8 minutes and 12 seconds of  radiofrequency energy was applied to the area of activation mapped by the  electroanatomical array.  This rendered the tachycardia noninducible.   IMPRESSION:  1.  Normal sinus function.  2.  Abnormal atrial function manifested by sustained atrial tachycardia      arising from the right lateral crista and about a third of the way  up      from the IVC to the SVC.  RF energy successfully eliminated the      tachycardia and rendered it noninducible.  3.  Dual antegrade AV nodal physiology with inducible echobeats but no      tachycardia.  4.  Normal His Purkinje system function.  5.  Accessory pathway function and ventricular stimulation were not      undertaken on this EP study.   CONCLUSION:  The results of the electrophysiological testing confirmed a  right lateral tachycardia arising off the crista.  This was likely the same  tachycardia that we had seen a year ago.  This had terminated with RF energy  at the previous application.  A catheter unfortunately had moved and only 18  seconds of RF energy had been applied.  Because we were not using an array  at that point, we could not go back and identify the appropriate site and no  further ablation was undertaken then.   The patient tolerated the procedure well.  The patient was then extubated at  this time and will be transferred to the recovery room.  Will be observed  overnight and then anticipate discharge in the morning.      SCK/MEDQ  D:  05/23/2004  T:  05/24/2004  Job:  [id number]   cc:   [facility]   [place]. Lenna Gilford, M.D. [facility]

## 2010-05-25 NOTE — Assessment & Plan Note (Signed)
[place] NOTE   NAME:[name], [name]                          MRN:          [id number]  DATE:11/14/2005                            DOB:          04/18/1932    [name] comes in today with no specific complaints. She is about a week  post-op or less from parathyroid surgery. She has been compliant with her  cholesterol medication which includes Vytorin 10/40 one daily, and Omega-3  fish oil capsules 2000 mg daily.  Other medications include Neurontin,  Flonase, Astelin, Zyrtec, Singulair, Protonix, Mirapex, aspirin 81 mg,  Sular, Atenolol, Meclozine, levothyroxine.   PHYSICAL EXAMINATION:  VITAL SIGNS: Weight of 205 pounds. Blood pressure  136/55, heart rate 64.   LABORATORY DATA:  Total cholesterol 167, triglycerides 120, HDL 42.8, LDL  100, AST is slightly elevated at 62, ALT is also elevated at 59.   ASSESSMENT:  [name] triglycerides are at goal less than 150. HDL is  improved but not yet in goal at greater than 45. LDL is slightly improved  but not yet at goal of less than 70. In the past [name] has had  chronically elevated liver function tests, this was a concern while she was  on Vytorin 10/80. They have remained mildly elevated after reducing Vytorin  to 10/40. [name]'s diet has been an issue in the past with portion sizes  being the main problem. She has improved this a little bit. Her exercise has  been limited lately by her recent surgery but she reports an increase in  energy since the surgery and is going to try to walk and exercise more.   PLAN:  We are going to continue with the same medications for now although  we may go back to Vytorin 10/80 while watching her liver function tests  closely.  I decided to go ahead and continue with the same medications for now though  and see her back in 3 months and will check a lipid and liver panel at that  time and make any changes that are  necessary.      Romeo Rabon, PharmD  Electronically Signed      Champ Mungo. Lovena Le, MD  Electronically Signed   TP/MedQ  DD: 11/14/2005  DT: 11/14/2005  Job #: [id number]

## 2010-05-25 NOTE — Op Note (Signed)
[facility]  Patient:    [name], [name] Visit Number: [id number] MRN: [id number]          Service Type: NES Location: [place] Attending Physician:  Silvestre Gunner Dictated by:   Domingo Pulse, M.D. Proc. Date: 03/13/01 Admit Date:  03/13/2001   CC:         Deborra Medina. Lenna Gilford, M.D. [facility]   Operative Report  PREOPERATIVE DIAGNOSIS:  Stress urinary incontinence.  POSTOPERATIVE DIAGNOSIS:  Stress urinary incontinence.  PROCEDURE:  Cystoscopy and SPARC bladder neck suspension.  SURGEON:  Domingo Pulse, M.D.  ANESTHESIA:  General.  COMPLICATIONS:  None.  DRAIN:  None.  BRIEF HISTORY:  This 77-year-old female had classic evidence of stress urinary incontinence.  She had a positive Marshall test.  She had a urodynamic evaluation that showed diminished leak point pressure of only 8.  She also is noted to have some degree of sensation urgency but no motor instability.  She was thought to generate normal bladder contractions, and it is felt that the patient would be a candidate for bladder suspension.  The patient has no significant cystocele or rectocele, and there is no significant prolapse and, for that reason, there does not appear to be any real need for her to undergo hysterectomy or additional reconstructive procedures.  The patient has had some bladder instability that has responded to anticholinergics, and she realized that she may require some of this long-term.  We have placed Estrace cream in the vagina preoperatively which has been somewhat helpful and should make her surgery safer and easier.  The patient understands that she may require anticholinergics and/or vaginal creams postoperatively.  She also understands that she may need additional surgery to either tighten or loosen the sling depending on whether she should develop over or undercorrection. The patient understands the risks and benefits of the procedure and gave full and informed  consent.  DESCRIPTION OF PROCEDURE:  After the successful induction of general anesthesia, the patient was placed in the dorsal lithotomy position, prepped with Betadine and draped in the usual sterile fashion.  The weighted vaginal speculum was placed.  The labia were sutured out to the medial thigh in order to improve exposure of the anterior vaginal mucosa.  The anterior vaginal mucosa was infiltrated directly over the major urethral complex with a mixture of lidocaine with epinephrine.  An incision was made, and flaps of mucosa were raised.  Posterior dissection was done, heading back towards but not through the endopelvic fascia.  Two small stab incisions were made 3 fingerbreadths apart directly above the pubic bone.  The Foley catheter was placed, and the bladder was drained.   With the bladder fully drained, the [facility] needles were passed from the abdominal incision, down to the vaginal incision, and through the endopelvic fascia.  Cystoscopy was then performed with both 12 degree and 70 degree lenses.  The needles had not entered the bladder in any way.  There was no injury to the bladder.  The bladder neck was noted to be wide open, but otherwise the bladder was unremarkable with no tumor or stones seen.  The trigone was normal.  There was no significant prolapse, as the patient does not have much in the way of a cystocele.  The [facility] sling was then attached to the needles and pulled up to the abdominal incision.  The sling was positioned so that a 63 [place] female urethral sound could be positioned between the urethra and the sling  itself with the dilator in place to prevent overcorrection.  The protective covering for the sling was cut and removed, thus positioning the sling.  The dilator was removed and replaced several times to ensure that there was still a slight loop just underneath the urethra, and urethral calibration showed that there was no angulation or tightening of  the urethra beyond what is desired.  The urethra appeared to be in a completely normal position with no overcorrection.  The area was irrigated with antibiotic solution, and the incision was closed with a running suture of 2-0 Vicryl.  Inspection showed that there was good support for the urethra, and there was no evidence of real cystocele, rectocele, or enterocele at that time.   The labial stitches were removed.  The weighted vaginal speculum was removed.  The [facility] sling was cut at the skin level, and the skin was then closed with Steri-Strips and Benzoin.  It was covered with a coverlet.  The patient had a packing placed with Estrace cream.  The patient is allergic to antibiotic cream.  She was taken to the recovery room in good condition.  She will be sent home later today with a prescription for Lorcet Plus as well as Cipro.  She was sent home with a Foley catheter for several days.  If the patient does not urinate, she will need to have sling adjustment done within 7-10 days. Dictated by:   Domingo Pulse, M.D. Attending Physician:  Silvestre Gunner DD:  03/13/01 TD:  03/13/01 Job: [id number]

## 2010-05-29 ENCOUNTER — Encounter: Payer: Self-pay | Admitting: Pulmonary Disease

## 2010-06-06 ENCOUNTER — Ambulatory Visit (INDEPENDENT_AMBULATORY_CARE_PROVIDER_SITE_OTHER)
Admission: RE | Admit: 2010-06-06 | Discharge: 2010-06-06 | Disposition: A | Discharge status: Home / Self Care | Payer: Medicare Other | Source: Ambulatory Visit | Attending: Pulmonary Disease | Admitting: Pulmonary Disease

## 2010-06-06 ENCOUNTER — Encounter: Payer: Self-pay | Admitting: Pulmonary Disease

## 2010-06-06 ENCOUNTER — Ambulatory Visit (INDEPENDENT_AMBULATORY_CARE_PROVIDER_SITE_OTHER): Payer: Medicare Other | Admitting: Pulmonary Disease

## 2010-06-06 DIAGNOSIS — J42 Unspecified chronic bronchitis: Secondary | ICD-10-CM

## 2010-06-06 DIAGNOSIS — K5090 Crohn's disease, unspecified, without complications: Secondary | ICD-10-CM

## 2010-06-06 DIAGNOSIS — E039 Hypothyroidism, unspecified: Secondary | ICD-10-CM

## 2010-06-06 DIAGNOSIS — E669 Obesity, unspecified: Secondary | ICD-10-CM

## 2010-06-06 DIAGNOSIS — F29 Unspecified psychosis not due to a substance or known physiological condition: Secondary | ICD-10-CM

## 2010-06-06 DIAGNOSIS — G2581 Restless legs syndrome: Secondary | ICD-10-CM

## 2010-06-06 DIAGNOSIS — I679 Cerebrovascular disease, unspecified: Secondary | ICD-10-CM

## 2010-06-06 DIAGNOSIS — I2510 Atherosclerotic heart disease of native coronary artery without angina pectoris: Secondary | ICD-10-CM

## 2010-06-06 DIAGNOSIS — I10 Essential (primary) hypertension: Secondary | ICD-10-CM

## 2010-06-06 DIAGNOSIS — M899 Disorder of bone, unspecified: Secondary | ICD-10-CM

## 2010-06-06 DIAGNOSIS — K219 Gastro-esophageal reflux disease without esophagitis: Secondary | ICD-10-CM

## 2010-06-06 DIAGNOSIS — K5730 Diverticulosis of large intestine without perforation or abscess without bleeding: Secondary | ICD-10-CM

## 2010-06-06 DIAGNOSIS — E785 Hyperlipidemia, unspecified: Secondary | ICD-10-CM

## 2010-06-06 DIAGNOSIS — IMO0001 Reserved for inherently not codable concepts without codable children: Secondary | ICD-10-CM

## 2010-06-06 DIAGNOSIS — I4892 Unspecified atrial flutter: Secondary | ICD-10-CM

## 2010-06-06 MED ORDER — LIDOCAINE 5 % EX PTCH: MEDICATED_PATCH | CUTANEOUS | Status: DC

## 2010-06-06 NOTE — Progress Notes (Signed)
Subjective:    Patient ID: [name], female    DOB: May 31, 1932, 77 y.o.   MRN: [id number]  HPI 77 y/o WF here for a follow up visit... she has mult med problems and meds as noted below...   ~  October 11, 2008:  she has updated list of all medical activities over the last 54mo w/ follow up evals by [facility] (GI), DrGross (Surg- perirectal abscess), DrPlovsky (Psychiatry), [facility] Derm & XRT... also saw the nurse practitioiner in July for UTI (Rx Cipro), TSH too low & Levothy dose derc slightly... today c/o sinus congestion & drainage of "grass green mucous" which improved w/ regular dosing of Claritin, Saline, Mucinex, Flonase... also c/o incr RLS symptoms and we discussed incr the Mirapex Rx... BMD here 10/10 showed osteopenia in the hips> continue Fosamax etc...  ~  June 08, 2009:  alot to get caught up on> she saw [facility] for GI 5/11- doing well on Lialda, Questran & Nexium;  several additional eye operations from DrAFisher at [facility];  f/u eval by ENT at [facility] in [place];  Cardiac f/u DrBensimhon 12/10- doing well, same Rx, f/u 89yr;  etc...  she had a list of questions and minor somatic complaints (bruising, ecchymoses, gas, nasal crusts) which were discussed and dispatched in succession... f/u fasting labs today...  ~  December 05, 2009:  she has gained 18# in the last 54mo & we reviewed diet recommendations, & an exercise program for her... she has had more surg from [facility] Ophthalmology on her left eyelid...  BP controlled on meds & denies CP/ palpit/ ch in SOB, etc;  no cerebral ischemic symptoms on the ASA;  Chol stable on Lip40;  GI followed by [facility];  Thyroid followed by Sandra Cockayne;  FM followed by DrDeveshwar;  she remains on Fosamax/ Calcium/ MVI/ VitD for her osteopenia w/ last BMD 10/10...  she is c/o some "crusts" on her right nares & she is concerned due to prev hx MRSA on left treated by DrBates w/ Doxy & she wants this antibiotic now for this new prob...   ~  Jun 06, 2010:  54mo ROV &  she is quite excited over her 55 y/o daughter's new baby recently... She continues her close f/u at [facility] Ophthalmology Dept w/ mult eye-lid procedures s/p Merkle cell tumor removal in past...  She informs me that she has had follow up appt w/ DrBensimhon, DrHayes, & DrPlovsky and that all 3 physicians have "released" her... She is c/o a stuffy nose & rec to use Saline nasal mist; she requests Lidoderm patches; she is due for a routine follow up CXR & fasting blood work...  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  Hx of ENT Problem>  left nasal soft tissue abscess drained by DrBates- MRSA treated w/ drainage, IV antibiotics, then Doxy... she uses Claritin & Flonase for sinuses.  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  ~  CT Chest 3/10 (at [facility]) showed atherosclerotic dis in Ao & coronaries, calcif AoV, left lung apex extra-pleural mass ~2cm, 4mm LUL nodule, scat ground-glass opac, atelec, marked biliary ductal dil- consider MRCP... (Old films and CT Chest here 6/07 & 10/09 compared- not mentioned on 2007 scan but the 2009 scan noted lesion left apex  2.6 x 2.0cm <sl larger than 2007 scan in retrospect> ?neurogenic/ nerve sheath tumor). ~  Lesion not mentioned on routine CXRs> CXR 5/12 norm heart, clear lungs, NAD...  OBSTRUCTIVE SLEEP APNEA  - Hx mild OSA on Sleep Study by DrDohmeier... she never really used the CPAP and states that "now I'm fine off of it"... she does have some insomnia & uses BENEDRYL Prn.  HYPERTENSION (ICD-401.9) - controlled on COREG 25mg Bid... BP=122/74 and pressures are good at home etc...  denies HA, visual changes, CP, palipit, dizziness, dyspnea, edema, etc... she has chronic fatigue  symptoms.  CORONARY ARTERY DISEASE (ICD-414.00) - on COREG 25mg Bid, & supposed to be on ASA 81mg /d... non-obstructive disease w/ cath 9/06 showing 30% LAD lesion, good LVF... neg Myoview 3/06 no ischemia, EF=73%... she is followed by DrBensimhon for Cards.  Hx of ATRIAL FLUTTER (ICD-427.32) - S/P catheter ablation in 2005 by DrKlein.  CEREBROVASCULAR DISEASE (ICD-437.9) - eval by DrWillis and DrBritz (Neurosurg @ [facility]) confirms 80% left middle cerebral art stenosis which is felt to be asymptomatic... they advise ASA 81mg /d, and secondary risk factor modification...  HYPERLIPIDEMIA (ICD-272.4) - now on LIPITOR 40mg /d + FISH OIL Bid...  ~  FLP 10/08 on Vytor10/80 showed TChol 130, TG 139, HDL 38, LDL 64... ~  FLP 4/09 on Vytor10/80 showed TChol 138, TG 102, HDL 62, LDL 56... ~  Vytorin & Fish Oil stopped 11/09 in [place]. ~  Jan10: DrBensimhon started Lipitor 40/d. ~  FLP 6/10 on Lip40 showed TChol 148, TG 94, HDL 67, LDL 62 ~  FLP 6/11 on Lip40 showed TChol 186, TG 76, HDL 82, LDL 88 ~  FLP 5/12 on Lip40 showed TChol 156, TG 110, HDL 81, LDL 53  OBESITY (ICD-278.00) - she notes that her fibromyalgia makes it difficult for her to exercise. ~  weight 8/09 = 221# ~  weight 12/09 = 197# ~  weight 4/10 = 169#... great job w/ diet program. ~  weight 6/11 = 173# ~  weight 11/11 = 190#... we discussed diet + exercise. ~  Weight 5/12 = 189#       << GI - MANAGED BY DrJHAYES >> GASTROESOPHAGEAL REFLUX DISEASE (ICD-530.81) - followed by DrJHayes for GI on NEXIUM 40Bid... she has GERD, Hx gastric ulcer. DIVERTICULOSIS OF COLON (ICD-562.10) - hx diverticulitis in past. IRRITABLE BOWEL SYNDROME (ICD-564.1) - DrHayes' eval w/ colon reported to show "ulcers" c/w Crohn's dis... COLONIC POLYPS - Hx of divertics w/ diverticulitis in the past and IBS, and hx of Colon Polyps w/ prev colonoscopy in 4/06... see 10/09 hosp by [facility] and f/u colonoscopy w/ ?Crohn's... CROHN'S DISEASE (ICD-555.9) - on  LIALDA 1.2gm- 3 tabs daily, QUESTRAN 2 scoops daily, & IMMODIUM Prn...       << ENDOCRINE - MANAGED BY DrBALAN >> HYPERPARATHYROIDISM, HX OF (ICD-V12.2) - she had hypercalcemia w/ right inferior parathyroid gland resected 11/07 by DrGerkin... HYPOTHYROIDISM (ICD-244.9) - on SYNTHROID daily... prev followed by DrBalan. ~  labs 6/07 showed TSH= 5.16... managed by DrBalan at that time. ~  labs here 7/10 on Levothy112 showed TSH= 0.16... rec> decr to 1/2 tab & f/u lab. ~  labs here 10/10 showed TSH= 10.04... rec> change to 66mcg/d. ~  labs here 6/11 on Levothy75 showed TSH= 6.84... rec> step up to 27mcg/d. ~  Labs 5/12 on Levothy88 showed TSH= 13.14... rec to incr to 151mcg/d.       << RHEUM - MAMAGED BY DrDEVESHWAR >> FIBROMYALGIA (ICD-729.1) - followed by DrDeveshwar... she uses TRAMADOL &  LIDODERM patches for pain. OSTEOPENIA (ICD-733.90) - on FOSAMAX 70mg /wk, ca++, vits... prev followed by DrBalan... ~  last BMD 9/08 showed TScores -0.8 in Spine & -2.3 in right FemNeck... ~  f/u BMD 10/10 showed TScores invalid in Spine, & -2.2 in right FemNeck... continue Rx.  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  SYNCOPE (ICD-780.2) - after a trip to visit family in [place] she was flying home to [place] and passed out on the plane... she was taken to a hosp outside of [place] and adm for several days... ~ syncopal spell "altered level of consciusness" she remembers being on the plane and next in the ER...  their DC note indicates poss seizure but nothing to substantiate this (no seiz activ noted by anyone)...  ~  MRI Brain was neg x  max sinusitis... MRA however showed 80% stenosis in the left MCA... ~  CDoppler showed mild plaque, norm waveforms, no signif stenoses... ~  2DEcho showed basically WNL, borderline conc LVH, EF=55-60%...  RESTLESS LEG SYNDROME (ICD-333.94) - she's been on MIRAPEX 0.5mg  Qd... ~  10/10: c/o incr symptoms and  instructed to incr Mirapex to 2 tabs...  PSYCHOSIS (ICD-298.9) - this was attributed to Prednisone therapy for the Crohn's dis... hosp in [place] and mult meds discontinued and changed by Psychiatrists... now followed by [facility] and he is weaning her off of the DEPAKOTE & KEPPRA... & she is off the prev Zoloft & Restoril...   Past Surgical History  Procedure Date  . Parathyroid adenoma surgery   . Cholecystectomy   . Moh';s surg left lower eye lid surgery and lid reconstruction surgery 2/10    Outpatient Encounter Prescriptions as of 06/06/2010  Medication Sig Dispense Refill  . alendronate (FOSAMAX) 70 MG tablet Take 70 mg by mouth every 7 (seven) days. Take with a full glass of water on an empty stomach.       Marland Kitchen ascorbic acid (VITAMIN C) 500 MG tablet Take 500 mg by mouth daily.        Marland Kitchen aspirin 81 MG tablet Take 81 mg by mouth daily.        Marland Kitchen atorvastatin (LIPITOR) 40 MG tablet Take 40 mg by mouth daily.        . Biotin 5000 MCG TABS Take 1 tablet by mouth daily.        . Calcium Carb-Cholecalciferol 600-400 MG-UNIT TABS 2 tablets at noon and 3 tabs at 6 pm       . carvedilol (COREG) 25 MG tablet TAKE 1 TABLET TWICE DAILY  180 tablet  2  . Cholecalciferol (VITAMIN D3) 2000 UNITS TABS Take by mouth. 1 capsule once a day       . cholestyramine (QUESTRAN) 4 GM/DOSE powder 2 scoops per day       . diphenhydrAMINE (BENADRYL) 25 MG tablet One tablet at bedtime as needed      . Evening Primrose Oil 500 MG CAPS Take by mouth. 1 tablet twice a day       . fish oil-omega-3 fatty acids 1000 MG capsule 2 capsules daily       . fluticasone (FLONASE) 50 MCG/ACT nasal spray 2 sprays by Nasal route daily.        Marland Kitchen guaifenesin (HUMIBID E) 400 MG TABS 2  times a day      . levothyroxine (SYNTHROID, LEVOTHROID) 88 MCG tablet Take 88 mcg by mouth daily.        Marland Kitchen  lidocaine (LIDODERM) 5 % Apply as directed       . loratadine (CLARITIN) 10 MG tablet Take 10 mg by mouth daily.        . meclizine  (ANTIVERT) 25 MG tablet Take 1 tablet every 6 hours as needed       . mesalamine (LIALDA) 1.2 G EC tablet Take 3 tablets by mouth once a day as directed by Dr. Madilyn Fireman       . Multiple Vitamin (MULTIVITAMIN PO) Once a day       . pramipexole (MIRAPEX) 0.5 MG tablet 1  Tablet  by mouth once two times daily      . Simethicone (PHAZYME) 180 MG CAPS 3 times a day       . traMADol (ULTRAM) 50 MG tablet Take 1 tablet 3 times a day as needed (DO NOT EXCEED 3 TABS A DAY)        Allergies  Allergen Reactions  . Carbamazepine     REACTION: unspecified  . Codeine     REACTION: nause and vomiting  . Fentanyl     REACTION: nausea and vomiting  . Fluoxetine Hcl     REACTION: unspecified  . Hydromorphone Hcl     REACTION: intolerant  . Indomethacin     REACTION: unspecified  . Lisinopril     REACTION: cough  . Meperidine Hcl     REACTION: intolerant  . Morphine     REACTION: intolerant  . Prednisone     REACTION: pt had psychiatric episode w/ hosp - lost memory x3weeks  . Propranolol Hcl     REACTION: unspecified  . Tramadol Hcl     REACTION: unspecified    Review of Systems        See HPI - all other systems neg except as noted... The patient complains of weight gain, dyspnea on exertion, and muscle weakness.  The patient denies anorexia, fever, weight loss, vision loss, decreased hearing, hoarseness, chest pain, syncope, peripheral edema, prolonged cough, headaches, hemoptysis, abdominal pain, melena, hematochezia, severe indigestion/heartburn, hematuria, incontinence, suspicious skin lesions, transient blindness, difficulty walking, depression, unusual weight change, abnormal bleeding, enlarged lymph nodes, angioedema, and breast masses.    Objective:   Physical Exam     WD, Overweight, 77 y/o WF in NAD...  GENERAL:  Alert & oriented; pleasant & cooperative... HEENT:  [place]/AT, EOM-wnl, EACs-clear, TMs-wnl, NOSE-clear, THROAT-clear & wnl. ** s/p mult surg left lower lid- sl red,  improved... NECK:  Supple w/ fairROM; no JVD; normal carotid impulses w/o bruits; no thyromegaly or nodules palpated; no lymphadenopathy. CHEST:  Clear to P & A; without wheezes/ rales/ or rhonchi. HEART:  Regular Rhythm; gr 1/6 SEM without rubs/ or gallops. ABDOMEN:  Obese soft & nontender; normal bowel sounds; no organomegaly or masses detected. EXT: without deformities, mild arthritic changes; no varicose veins/ +venous insuffic/ no edema... NEURO:  CN's intact; no focal neuro deficits... DERM:  No lesions noted; no rash etc...   Assessment & Plan:   Ophthalmology>  Followed at [facility] by DrFowler w/ numerous surg procedures...  HBP>  Controlled on the coreg but needs better low sodium wt reducing diet...  Cardiac> CAD, Hx AFlutter s/p ablation>  Stable on ASA + the Coreg, continue same...  HPYERLIPID>  Stable on the Lip40...  Obesity> she has not been successful losing the wt, we reviewed wt reducing diet + exercise...  GI>  Followed by [name] & she has been doing well, "he released me" she says...  HYPOTHYROID>  TSH is elev at 13 on the Synthroid88 (we will confirm taking it daily) & needs incr dose to 165mcg/d...  Rheum>  FM/ osteopenia>  Followed by Rober Minion for Rheum, continue her regimen.Marland KitchenMarland Kitchen

## 2010-06-06 NOTE — Patient Instructions (Signed)
Today we updated your med list in EPIC...    We wrote a new prescription for LIDODERM patches to use as needed for pain (12H on, 12H off)...  Today we did your follow up CXR> Please return to our lab in the AM for your FASTINg blood work...    Then call the PHONE TREE in a few days for your results...    Dial [id number] & when prompted enter your patient number followed by the # symbol...    Your patient number is:  [id number]#  Let's get on track w/ our diet & exercise program... Call for any questions... Let's plan another routine follow up in 6 months, sooner if needed for problems.Marland KitchenMarland Kitchen

## 2010-06-07 ENCOUNTER — Other Ambulatory Visit (INDEPENDENT_AMBULATORY_CARE_PROVIDER_SITE_OTHER): Payer: Medicare Other

## 2010-06-07 DIAGNOSIS — I10 Essential (primary) hypertension: Secondary | ICD-10-CM

## 2010-06-07 DIAGNOSIS — E039 Hypothyroidism, unspecified: Secondary | ICD-10-CM

## 2010-06-07 DIAGNOSIS — I4892 Unspecified atrial flutter: Secondary | ICD-10-CM

## 2010-06-07 DIAGNOSIS — E785 Hyperlipidemia, unspecified: Secondary | ICD-10-CM

## 2010-06-07 LAB — LIPID PANEL
LDL Cholesterol: 53 mg/dL (ref 0–99)
Total CHOL/HDL Ratio: 2
Triglycerides: 110 mg/dL (ref 0.0–149.0)

## 2010-06-07 LAB — CBC WITH DIFFERENTIAL/PLATELET
Basophils Relative: 0.8 % (ref 0.0–3.0)
Eosinophils Absolute: 0.1 10*3/uL (ref 0.0–0.7)
MCHC: 34.4 g/dL (ref 30.0–36.0)
MCV: 97 fl (ref 78.0–100.0)
Monocytes Absolute: 0.5 10*3/uL (ref 0.1–1.0)
Neutro Abs: 2.8 10*3/uL (ref 1.4–7.7)
Neutrophils Relative %: 45.9 % (ref 43.0–77.0)
RBC: 4.03 Mil/uL (ref 3.87–5.11)

## 2010-06-07 LAB — BASIC METABOLIC PANEL
Chloride: 104 mEq/L (ref 96–112)
Creatinine, Ser: 0.7 mg/dL (ref 0.4–1.2)
Potassium: 4.8 mEq/L (ref 3.5–5.1)
Sodium: 140 mEq/L (ref 135–145)

## 2010-06-07 LAB — HEPATIC FUNCTION PANEL
ALT: 26 U/L (ref 0–35)
Bilirubin, Direct: 0.1 mg/dL (ref 0.0–0.3)
Total Bilirubin: 0.8 mg/dL (ref 0.3–1.2)

## 2010-06-09 ENCOUNTER — Encounter: Payer: Self-pay | Admitting: Pulmonary Disease

## 2010-06-11 ENCOUNTER — Other Ambulatory Visit: Payer: Self-pay | Admitting: *Deleted

## 2010-06-11 MED ORDER — LEVOTHYROXINE SODIUM 100 MCG PO TABS: 100.0000 ug | ORAL_TABLET | Freq: Every day | ORAL | Status: DC

## 2010-06-20 ENCOUNTER — Telehealth: Payer: Self-pay | Admitting: Pulmonary Disease

## 2010-06-20 MED ORDER — LEVOTHYROXINE SODIUM 100 MCG PO TABS: 100.0000 ug | ORAL_TABLET | Freq: Every day | ORAL | Status: DC

## 2010-06-20 NOTE — Telephone Encounter (Signed)
Spoke with pharmacist and gave verbal okay for levothyroxine- apparently she mailed rx in w/o SN's signature. Nothing further needed.

## 2010-07-12 ENCOUNTER — Other Ambulatory Visit: Payer: Self-pay | Admitting: Internal Medicine

## 2010-07-20 ENCOUNTER — Other Ambulatory Visit: Payer: Self-pay | Admitting: Internal Medicine

## 2010-07-26 ENCOUNTER — Ambulatory Visit
Admission: RE | Admit: 2010-07-26 | Discharge: 2010-07-26 | Disposition: A | Discharge status: Home / Self Care | Payer: Medicare Other | Source: Ambulatory Visit | Attending: Radiation Oncology | Admitting: Radiation Oncology

## 2010-07-26 DIAGNOSIS — Z923 Personal history of irradiation: Secondary | ICD-10-CM | POA: Insufficient documentation

## 2010-07-26 DIAGNOSIS — C4A30 Merkel cell carcinoma of unspecified part of face: Secondary | ICD-10-CM | POA: Insufficient documentation

## 2010-10-09 LAB — BASIC METABOLIC PANEL
BUN: 11
CO2: 27
Calcium: 8.8
Chloride: 94 — ABNORMAL LOW
Creatinine, Ser: 1.36 — ABNORMAL HIGH
GFR calc Af Amer: >60
GFR calc non Af Amer: >60
Glucose, Bld: 110 — ABNORMAL HIGH
Glucose, Bld: 144 — ABNORMAL HIGH
Potassium: 4.3
Sodium: 137

## 2010-10-09 LAB — LIPID PANEL
LDL Cholesterol: 34
Total CHOL/HDL Ratio: 2.3
Triglycerides: 87
VLDL: 17

## 2010-10-09 LAB — COMPREHENSIVE METABOLIC PANEL
AST: 57 — ABNORMAL HIGH
Albumin: 3.1 — ABNORMAL LOW
Alkaline Phosphatase: 110
BUN: 2 — ABNORMAL LOW
Calcium: 8 — ABNORMAL LOW
Calcium: 8.2 — ABNORMAL LOW
Creatinine, Ser: 0.88
GFR calc Af Amer: >60
Glucose, Bld: 94
Potassium: 4
Total Protein: 5.8 — ABNORMAL LOW

## 2010-10-09 LAB — CBC
HCT: 34.9 — ABNORMAL LOW
Hemoglobin: 10.8 — ABNORMAL LOW
Hemoglobin: 11.6 — ABNORMAL LOW
MCHC: 33.4
MCHC: 33.7
MCHC: 33.9
MCV: 93
MCV: 93.1
Platelets: 141 — ABNORMAL LOW
Platelets: 169
RBC: 3.43 — ABNORMAL LOW
RDW: 14.7
RDW: 15.2
WBC: 7.4
WBC: 7.5

## 2010-10-09 LAB — URINE CULTURE
Colony Count: >=100000
Special Requests: NEGATIVE

## 2010-10-09 LAB — CREATININE, URINE, RANDOM: Creatinine, Urine: 136.8

## 2010-10-09 LAB — URINALYSIS, ROUTINE W REFLEX MICROSCOPIC
Bilirubin Urine: NEGATIVE
Ketones, ur: NEGATIVE
Nitrite: NEGATIVE
Protein, ur: NEGATIVE
pH: 5

## 2010-10-09 LAB — DIFFERENTIAL
Basophils Absolute: 0
Basophils Relative: 0
Monocytes Relative: 6
Neutro Abs: 5.4
Neutrophils Relative %: 72

## 2010-10-09 LAB — CARDIAC PANEL(CRET KIN+CKTOT+MB+TROPI)
Relative Index: INVALID
Relative Index: INVALID
Total CK: 97
Troponin I: 0.01

## 2010-10-09 LAB — URINE MICROSCOPIC-ADD ON

## 2010-10-09 LAB — D-DIMER, QUANTITATIVE: D-Dimer, Quant: 1.21 — ABNORMAL HIGH

## 2010-10-11 ENCOUNTER — Other Ambulatory Visit: Payer: Self-pay | Admitting: Internal Medicine

## 2010-10-11 ENCOUNTER — Other Ambulatory Visit: Payer: Self-pay | Admitting: *Deleted

## 2010-10-11 MED ORDER — ALENDRONATE SODIUM 70 MG PO TABS: 70.0000 mg | ORAL_TABLET | ORAL | Status: DC

## 2010-11-19 ENCOUNTER — Telehealth: Payer: Self-pay | Admitting: Pulmonary Disease

## 2010-11-19 MED ORDER — AMOXICILLIN-POT CLAVULANATE 875-125 MG PO TABS: 1.0000 | ORAL_TABLET | Freq: Two times a day (BID) | ORAL | Status: AC

## 2010-11-19 NOTE — Telephone Encounter (Signed)
Spoke with pt and notified rx for augmentin will be sent and also to keep taking mucinex, fluids/rest. Pt verbalized understanding and rx was sent.

## 2010-11-19 NOTE — Telephone Encounter (Signed)
I spoke with pt and she c/o cough w/ dark green thick mucus, nasal drainage x 2 weeks. Pt states she has been taking guaifenesin 400 mg 3 times a day and allegra once a day. Pt denies any wheezing, chest tightness, sob, fever, nausea, vomiting. Pt is requesting further recs from Dr. Lenna Gilford. Please advise, thanks  Allergies  Allergen Reactions  . Carbamazepine     REACTION: unspecified  . Codeine     REACTION: nause and vomiting  . Fentanyl     REACTION: nausea and vomiting  . Fluoxetine Hcl     REACTION: unspecified  . Hydromorphone Hcl     REACTION: intolerant  . Indomethacin     REACTION: unspecified  . Lisinopril     REACTION: cough  . Meperidine Hcl     REACTION: intolerant  . Morphine     REACTION: intolerant  . Prednisone     REACTION: pt had psychiatric episode w/ hosp - lost memory x3weeks  . Propranolol Hcl     REACTION: unspecified  . Tramadol Hcl     REACTION: unspecified     Charma Igo, CMA

## 2010-11-19 NOTE — Telephone Encounter (Signed)
Per SN---ok for pt to have augmentin 875mg   #20 1 po bid.  Cont. The nucinex, fluids, rest.

## 2010-12-07 ENCOUNTER — Encounter: Payer: Self-pay | Admitting: Pulmonary Disease

## 2010-12-07 ENCOUNTER — Other Ambulatory Visit (INDEPENDENT_AMBULATORY_CARE_PROVIDER_SITE_OTHER): Payer: Medicare Other

## 2010-12-07 ENCOUNTER — Ambulatory Visit (INDEPENDENT_AMBULATORY_CARE_PROVIDER_SITE_OTHER): Payer: Medicare Other | Admitting: Pulmonary Disease

## 2010-12-07 DIAGNOSIS — K5090 Crohn's disease, unspecified, without complications: Secondary | ICD-10-CM

## 2010-12-07 DIAGNOSIS — M949 Disorder of cartilage, unspecified: Secondary | ICD-10-CM

## 2010-12-07 DIAGNOSIS — I10 Essential (primary) hypertension: Secondary | ICD-10-CM

## 2010-12-07 DIAGNOSIS — IMO0001 Reserved for inherently not codable concepts without codable children: Secondary | ICD-10-CM

## 2010-12-07 DIAGNOSIS — E669 Obesity, unspecified: Secondary | ICD-10-CM

## 2010-12-07 DIAGNOSIS — I679 Cerebrovascular disease, unspecified: Secondary | ICD-10-CM

## 2010-12-07 DIAGNOSIS — E785 Hyperlipidemia, unspecified: Secondary | ICD-10-CM

## 2010-12-07 DIAGNOSIS — K5730 Diverticulosis of large intestine without perforation or abscess without bleeding: Secondary | ICD-10-CM

## 2010-12-07 DIAGNOSIS — I2510 Atherosclerotic heart disease of native coronary artery without angina pectoris: Secondary | ICD-10-CM

## 2010-12-07 DIAGNOSIS — E039 Hypothyroidism, unspecified: Secondary | ICD-10-CM

## 2010-12-07 DIAGNOSIS — G2581 Restless legs syndrome: Secondary | ICD-10-CM

## 2010-12-07 DIAGNOSIS — J42 Unspecified chronic bronchitis: Secondary | ICD-10-CM

## 2010-12-07 DIAGNOSIS — K219 Gastro-esophageal reflux disease without esophagitis: Secondary | ICD-10-CM

## 2010-12-07 DIAGNOSIS — M899 Disorder of bone, unspecified: Secondary | ICD-10-CM

## 2010-12-07 MED ORDER — LIDOCAINE 5 % EX PTCH: MEDICATED_PATCH | CUTANEOUS | Status: DC

## 2010-12-07 MED ORDER — TRAMADOL HCL 50 MG PO TABS: ORAL_TABLET | ORAL | Status: DC

## 2010-12-07 MED ORDER — PRAMIPEXOLE DIHYDROCHLORIDE 0.5 MG PO TABS: 0.5000 mg | ORAL_TABLET | Freq: Two times a day (BID) | ORAL | Status: DC

## 2010-12-07 NOTE — Progress Notes (Signed)
Subjective:    Patient ID: [name], female    DOB: October 03, 1932, 77 y.o.   MRN: [id number]  HPI 77 y/o WF here for a follow up visit... she has mult med problems and meds as noted below...   ~  October 11, 2008:  she has updated list of all medical activities over the last 90mo w/ follow up evals by [facility] (GI), DrGross (Surg- perirectal abscess), DrPlovsky (Psychiatry), [facility] D[facility] & XRT... also saw the nurse practitioiner in July for UTI (Rx Cipro), TSH too low & Levothy dose derc slightly... today c/o sinus congestion & drainage of "grass green mucous" which improved w/ regular dosing of Claritin, Saline, Mucinex, Flonase... also c/o incr RLS symptoms and we discussed incr the Mirapex Rx... BMD here 10/10 showed osteopenia in the hips> continue Fosamax etc...  ~  June 08, 2009:  alot to get caught up on> she saw [facility] for GI 5/11- doing well on Lialda, Questran & Nexium;  several additional eye operations from DrAFisher at [facility];  f/u eval by ENT at [facility] in [place];  Cardiac f/u DrBensimhon 12/10- doing well, same Rx, f/u 30yr;  etc...  she had a list of questions and minor somatic complaints (bruising, ecchymoses, gas, nasal crusts) which were discussed and dispatched in succession... f/u fasting labs today...  ~  December 05, 2009:  she has gained 18# in the last 90mo & we reviewed diet recommendations, & an exercise program for her... she has had more surg from [facility] on her left eyelid...  BP controlled on meds & denies CP/ palpit/ ch in SOB, etc;  no cerebral ischemic symptoms on the ASA;  Chol stable on Lip40;  GI followed by [facility];  Thyroid followed by Sandra Cockayne;  FM followed by DrDeveshwar;  she remains on Fosamax/ Calcium/ MVI/ VitD for her osteopenia w/ last BMD 10/10...  she is c/o some "crusts" on her right nares & she is concerned due to prev hx MRSA on left treated by DrBates w/ Doxy & she wants this antibiotic now for this new prob...   ~  Jun 06, 2010:  90mo ROV &  she is quite excited over her 48 y/o daughter's new baby recently... She continues her close f/u at [facility] Dept w/ mult eye-lid procedures s/p Merkle cell tumor removal in past...  She informs me that she has had follow up appt w/ DrBensimhon, DrHayes, & DrPlovsky and that all 3 physicians have "released" her... She is c/o a stuffy nose & rec to use Saline nasal mist; she requests Lidoderm patches; she is due for a routine follow up CXR (clear, NAD) & fasting blood work (all wnl x incr TSH & we will incr dose from 88==>100) but she never did incr the med & stayed on 73mcg/d.  ~  December 07, 2010:  90mo ROV & she had recent antibiotic Rx for sinusitis- now improved w/ clear drainage only; otherwise stable & f/u TSH today, still on 43mcg/d dose Synthroid is wnl at 2.63...    She saw DrJHayes for GI 9/12> hx Crohn's colitis, loose stools, gas, etc; treated w/ Lialda 3.6gm/d along w/ Questran daily; stable- continue same + probiotic...    She also had continuing f/u at [facility] ophthalmology DrFowler> note reviewed...          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 she uses Claritin & Flonase for sinuses.  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  ~  CT Chest 3/10 (at [facility]) showed atherosclerotic dis in Ao & coronaries, calcif AoV, left lung apex extra-pleural mass ~2cm, 4mm LUL nodule, scat ground-glass opac, atelec, marked biliary ductal dil- consider MRCP... (Old films and CT Chest here 6/07 & 10/09 compared- not mentioned on 2007 scan but the 2009 scan noted lesion left apex 2.6 x 2.0cm <sl larger  than 2007 scan in retrospect> ?neurogenic/ nerve sheath tumor). ~  Lesion not mentioned on routine CXRs> CXR 5/12 norm heart, clear lungs, NAD...  OBSTRUCTIVE SLEEP APNEA  - Hx mild OSA on Sleep Study by DrDohmeier... she never really used the CPAP and states that "now I'm fine off of it"... she does have some insomnia & uses BENEDRYL Prn.  HYPERTENSION (ICD-401.9) - controlled on COREG 25mg Bid... BP=122/74 and pressures are good at home etc...  denies HA, visual changes, CP, palipit, dizziness, dyspnea, edema, etc... she has chronic fatigue symptoms.  CORONARY ARTERY DISEASE (ICD-414.00) - on COREG 25mg Bid, & supposed to be on ASA 81mg /d... non-obstructive disease w/ cath 9/06 showing 30% LAD lesion, good LVF... neg Myoview 3/06 no ischemia, EF=73%... she is followed by DrBensimhon for Cards.  Hx of ATRIAL FLUTTER (ICD-427.32) - S/P catheter ablation in 2005 by DrKlein.  CEREBROVASCULAR DISEASE (ICD-437.9) - eval by DrWillis and DrBritz (Neurosurg @ [facility]) confirms 80% left middle cerebral art stenosis which is felt to be asymptomatic... they advise ASA 81mg /d, and secondary risk factor modification...  HYPERLIPIDEMIA (ICD-272.4) - now on LIPITOR 40mg /d + FISH OIL Bid...  ~  FLP 10/08 on Vytor10/80 showed TChol 130, TG 139, HDL 38, LDL 64... ~  FLP 4/09 on Vytor10/80 showed TChol 138, TG 102, HDL 62, LDL 56... ~  Vytorin & Fish Oil stopped 11/09 in [place]. ~  Jan10: DrBensimhon started Lipitor 40/d. ~  FLP 6/10 on Lip40 showed TChol 148, TG 94, HDL 67, LDL 62 ~  FLP 6/11 on Lip40 showed TChol 186, TG 76, HDL 82, LDL 88 ~  FLP 5/12 on Lip40 showed TChol 156, TG 110, HDL 81, LDL 53  OBESITY (ICD-278.00) - she notes that her fibromyalgia makes it difficult for her to exercise. ~  weight 8/09 = 221# ~  weight 12/09 = 197# ~  weight 4/10 = 169#... great job w/ diet program. ~  weight 6/11 = 173# ~  weight 11/11 = 190#... we discussed diet + exercise. ~  Weight 5/12 = 189#       <<  GI - MANAGED BY DrJHAYES >> GASTROESOPHAGEAL REFLUX DISEASE (ICD-530.81) - followed by DrJHayes for GI on NEXIUM 40Bid... she has GERD, Hx gastric ulcer. DIVERTICULOSIS OF COLON (ICD-562.10) - hx diverticulitis in past. IRRITABLE BOWEL SYNDROME (ICD-564.1) - DrHayes' eval w/ colon reported to show "ulcers" c/w Crohn's dis... COLONIC POLYPS - Hx of divertics w/ diverticulitis in the past and IBS, and hx of Colon Polyps w/ prev colonoscopy in 4/06... see 10/09 hosp by [facility] and f/u colonoscopy w/ ?Crohn's... CROHN'S DISEASE (ICD-555.9) - on LIALDA 1.2gm- 3 tabs daily, QUESTRAN 2 scoops daily, & IMMODIUM Prn...       << ENDOCRINE - MANAGED BY DrBALAN >> HYPERPARATHYROIDISM, HX OF (ICD-V12.2) - she had hypercalcemia w/  right inferior parathyroid gland resected 11/07 by DrGerkin... HYPOTHYROIDISM (ICD-244.9) - on SYNTHROID daily... prev followed by DrBalan. ~  labs 6/07 showed TSH= 5.16... managed by DrBalan at that time. ~  labs here 7/10 on Levothy112 showed TSH= 0.16... rec> decr to 1/2 tab & f/u lab. ~  labs here 10/10 showed TSH= 10.04... rec> change to 35mcg/d. ~  labs here 6/11 on Levothy75 showed TSH= 6.84... rec> step up to 80mcg/d. ~  Labs 5/12 on Levothy88 showed TSH= 13.14... rec to incr to 161mcg/d (she never did)... ~  Labs 11/12 on Levo88 showed TSH= 2.63... Continue same.       << RHEUM - MAMAGED BY DrDEVESHWAR >> FIBROMYALGIA (ICD-729.1) - followed by DrDeveshwar... she uses TRAMADOL & LIDODERM patches for pain. OSTEOPENIA (ICD-733.90) - on FOSAMAX 70mg /wk, ca++, vits... prev followed by DrBalan... ~  last BMD 9/08 showed TScores -0.8 in Spine & -2.3 in right FemNeck... ~  f/u BMD 10/10 showed TScores invalid in Spine, & -2.2 in right FemNeck... continue Rx.  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  SYNCOPE (ICD-780.2) - after a trip to visit family in [place] she was flying home to [place] and passed out on the  plane... she was taken to a hosp outside of [place] and adm for several days... ~ syncopal spell "altered level of consciusness" she remembers being on the plane and next in the ER...  their DC note indicates poss seizure but nothing to substantiate this (no seiz activ noted by anyone)...  ~  MRI Brain was neg x  max sinusitis... MRA however showed 80% stenosis in the left MCA... ~  CDoppler showed mild plaque, norm waveforms, no signif stenoses... ~  2DEcho showed basically WNL, borderline conc LVH, EF=55-60%...  RESTLESS LEG SYNDROME (ICD-333.94) - she's been on MIRAPEX 0.5mg  Qd... ~  10/10: c/o incr symptoms and instructed to incr Mirapex to 2 tabs...  PSYCHOSIS (ICD-298.9) - this was attributed to Prednisone therapy for the Crohn's dis... hosp in [place] and mult meds discontinued and changed by Psychiatrists... now followed by [facility] and he is weaning her off of the DEPAKOTE & KEPPRA... & she is off the prev Zoloft & Restoril...   Past Surgical History  Procedure Date  . Parathyroid adenoma surgery   . Cholecystectomy   . Moh';s surg left lower eye lid surgery and lid reconstruction surgery 2/10    Outpatient Encounter Prescriptions as of 12/07/2010  Medication Sig Dispense Refill  . alendronate (FOSAMAX) 70 MG tablet Take 1 tablet (70 mg total) by mouth every 7 (seven) days. Take with a full glass of water on an empty stomach.  4 tablet  3  . ascorbic acid (VITAMIN C) 500 MG tablet Take 500 mg by mouth daily.        Marland Kitchen aspirin 81 MG tablet Take 81 mg by mouth daily.        . Biotin 5000 MCG TABS Take 1 tablet by mouth daily.        . Calcium Carb-Cholecalciferol 600-400 MG-UNIT TABS 2 tablets at noon and 3 tabs at 6 pm       . carvedilol (COREG) 25 MG tablet TAKE 1 TABLET TWICE DAILY  180 tablet  2  . Cholecalciferol (VITAMIN D3) 2000 UNITS TABS Take by mouth. 1 capsule once a day       . cholestyramine (QUESTRAN) 4 GM/DOSE powder 2 scoops per day       .  diphenhydrAMINE  (BENADRYL) 25 MG tablet One tablet at bedtime as needed      . Evening Primrose Oil 500 MG CAPS 1 tablet twice a day      . fexofenadine (ALLEGRA) 180 MG tablet Take 180 mg by mouth daily.        . fish oil-omega-3 fatty acids 1000 MG capsule 2 capsules daily       . fluticasone (FLONASE) 50 MCG/ACT nasal spray 2 sprays by Nasal route daily.        Marland Kitchen guaifenesin (HUMIBID E) 400 MG TABS 3   times a day      . levothyroxine (SYNTHROID, LEVOTHROID) 88 MCG tablet Take 88 mcg by mouth daily.        Marland Kitchen lidocaine (LIDODERM) 5 % Apply as directed  30 patch  6  . LIPITOR 40 MG tablet TAKE 1 TABLET BY MOUTH AT BEDTIME  90 tablet  2  . meclizine (ANTIVERT) 25 MG tablet Take 1 tablet every 6 hours as needed       . mesalamine (LIALDA) 1.2 G EC tablet Take 3 tablets by mouth once a day as directed by Dr. Madilyn Fireman       . Multiple Vitamin (MULTIVITAMIN PO) Once a day       . pramipexole (MIRAPEX) 0.5 MG tablet 1  Tablet  by mouth once two times daily      . Simethicone (PHAZYME) 180 MG CAPS 3 times a day       . traMADol (ULTRAM) 50 MG tablet Take 1 tablet 3 times a day as needed (DO NOT EXCEED 3 TABS A DAY)      . DISCONTD: levothyroxine (SYNTHROID, LEVOTHROID) 100 MCG tablet Take 1 tablet (100 mcg total) by mouth daily.  90 tablet  3  . DISCONTD: loratadine (CLARITIN) 10 MG tablet Take 10 mg by mouth daily.          Allergies  Allergen Reactions  . Carbamazepine     REACTION: unspecified  . Codeine     REACTION: nause and vomiting  . Fentanyl     REACTION: nausea and vomiting  . Fluoxetine Hcl     REACTION: unspecified  . Hydromorphone Hcl     REACTION: intolerant  . Indomethacin     REACTION: unspecified  . Lisinopril     REACTION: cough  . Meperidine Hcl     REACTION: intolerant  . Morphine     REACTION: intolerant  . Prednisone     REACTION: pt had psychiatric episode w/ hosp - lost memory x3weeks  . Propranolol Hcl     REACTION: unspecified  . Tramadol Hcl     REACTION: unspecified     Current Medications, Allergies, Past Medical History, Past Surgical History, Family History, and Social History were reviewed in [employer] record.     Review of Systems        See HPI - all other systems neg except as noted... The patient complains of weight gain, dyspnea on exertion, and muscle weakness.  The patient denies anorexia, fever, weight loss, vision loss, decreased hearing, hoarseness, chest pain, syncope, peripheral edema, prolonged cough, headaches, hemoptysis, abdominal pain, melena, hematochezia, severe indigestion/heartburn, hematuria, incontinence, suspicious skin lesions, transient blindness, difficulty walking, depression, unusual weight change, abnormal bleeding, enlarged lymph nodes, angioedema, and breast masses.    Objective:   Physical Exam     WD, Overweight, 77 y/o WF in NAD...  GENERAL:  Alert & oriented; pleasant & cooperative... HEENT:  /AT, EOM-wnl, EACs-clear, TMs-wnl, NOSE-clear, THROAT-clear & wnl. ** s/p mult surg left lower lid- sl red, improved... NECK:  Supple w/ fairROM; no JVD; normal carotid impulses w/o bruits; no thyromegaly or nodules palpated; no lymphadenopathy. CHEST:  Clear to P & A; without wheezes/ rales/ or rhonchi. HEART:  Regular Rhythm; gr 1/6 SEM without rubs/ or gallops. ABDOMEN:  Obese soft & nontender; normal bowel sounds; no organomegaly or masses detected. EXT: without deformities, mild arthritic changes; no varicose veins/ +venous insuffic/ no edema... NEURO:  CN's intact; no focal neuro deficits... DERM:  No lesions noted; no rash etc...   Assessment & Plan:   Ophthalmology>  Followed at [facility] by DrFowler w/ numerous surg procedures for her Merkel cell tumor of the eyelid...  HBP>  Controlled on the Coreg but needs better low sodium wt reducing diet...  Cardiac> CAD, Hx AFlutter s/p ablation>  Stable on ASA + the Coreg, continue same...  HYPERLIPID>  Stable on the Lip40...  Obesity> she has  not been successful losing the wt, we reviewed wt reducing diet + exercise...  GI>  Followed by [facility] as noted for her Crohn's etc & she has been doing well, "he released me" she says...  HYPOTHYROID>  TSH is elev at 13 on the Synthroid88 (we will confirm taking it daily) & needs incr dose to 142mcg/d...  Rheum>  FM/ osteopenia>  Followed by Rober Minion for Rheum, continue her regimen...   Patient's Medications  New Prescriptions   No medications on file  Previous Medications   ASCORBIC ACID (VITAMIN C) 500 MG TABLET    Take 500 mg by mouth daily.     ASPIRIN 81 MG TABLET    Take 81 mg by mouth daily.     BIOTIN 5000 MCG TABS    Take 1 tablet by mouth daily.     CALCIUM CARB-CHOLECALCIFEROL 600-400 MG-UNIT TABS    2 tablets at noon and 3 tabs at 6 pm    CARVEDILOL (COREG) 25 MG TABLET    TAKE 1 TABLET TWICE DAILY   CHOLECALCIFEROL (VITAMIN D3) 2000 UNITS TABS    Take by mouth. 1 capsule once a day    CHOLESTYRAMINE (QUESTRAN) 4 GM/DOSE POWDER    2 scoops per day    DIPHENHYDRAMINE (BENADRYL) 25 MG TABLET    One tablet at bedtime as needed   EVENING PRIMROSE OIL 500 MG CAPS    1 tablet twice a day   FEXOFENADINE (ALLEGRA) 180 MG TABLET    Take 180 mg by mouth daily.     FISH OIL-OMEGA-3 FATTY ACIDS 1000 MG CAPSULE    2 capsules daily    FLUTICASONE (FLONASE) 50 MCG/ACT NASAL SPRAY    2 sprays by Nasal route daily.     GUAIFENESIN (HUMIBID E) 400 MG TABS    3   times a day   LIPITOR 40 MG TABLET    TAKE 1 TABLET BY MOUTH AT BEDTIME   MECLIZINE (ANTIVERT) 25 MG TABLET    Take 1 tablet every 6 hours as needed    MESALAMINE (LIALDA) 1.2 G EC TABLET    Take 3 tablets by mouth once a day as directed by Dr. Madilyn Fireman    MULTIPLE VITAMIN (MULTIVITAMIN PO)    Once a day    SIMETHICONE (PHAZYME) 180 MG CAPS    3 times a day   Modified Medications   Modified Medication Previous Medication   ALENDRONATE (FOSAMAX) 70 MG TABLET alendronate (FOSAMAX) 70 MG tablet  Take 1 tablet (70 mg total) by  mouth every 7 (seven) days. Take with a full glass of water on an empty stomach.    Take 1 tablet (70 mg total) by mouth every 7 (seven) days. Take with a full glass of water on an empty stomach.   LEVOTHYROXINE (SYNTHROID, LEVOTHROID) 88 MCG TABLET levothyroxine (SYNTHROID, LEVOTHROID) 88 MCG tablet      Take 1 tablet (88 mcg total) by mouth daily.    Take 88 mcg by mouth daily.     LIDOCAINE (LIDODERM) 5 % lidocaine (LIDODERM) 5 %      Apply as directed    Apply as directed   PRAMIPEXOLE (MIRAPEX) 0.5 MG TABLET pramipexole (MIRAPEX) 0.5 MG tablet      Take 1 tablet (0.5 mg total) by mouth 2 (two) times daily. 1  Tablet  by mouth once two times daily    1  Tablet  by mouth once two times daily   TRAMADOL (ULTRAM) 50 MG TABLET traMADol (ULTRAM) 50 MG tablet      Take 1 tablet 3 times a day as needed (DO NOT EXCEED 3 TABS A DAY)    Take 1 tablet 3 times a day as needed (DO NOT EXCEED 3 TABS A DAY)  Discontinued Medications   LEVOTHYROXINE (SYNTHROID, LEVOTHROID) 100 MCG TABLET    Take 1 tablet (100 mcg total) by mouth daily.   LORATADINE (CLARITIN) 10 MG TABLET    Take 10 mg by mouth daily.

## 2010-12-07 NOTE — Patient Instructions (Signed)
Today we updated your med list in our EPIC system...    Continue your current medications the same...    We will hold off on refill of Synthroid until we determine the dose you need, then we will send it in to [facility]...  Today we did your follow up blood work...    Please call the PHONE TREE in a few days for your results...    Dial [id number] & when prompted enter your patient number followed by the # symbol...    Your patient number is:  [id number]#  We will also arrange for a follow up Bone Density Test & we will call you w/ this result when avail...  Let's get on that low carb, low fat diet & increase the exercise program to aide in weight reduction...  Let's plan another follow up visit in 6 months.Marland KitchenMarland Kitchen

## 2010-12-10 ENCOUNTER — Telehealth: Payer: Self-pay | Admitting: Pulmonary Disease

## 2010-12-10 MED ORDER — LEVOTHYROXINE SODIUM 88 MCG PO TABS: 88.0000 ug | ORAL_TABLET | Freq: Every day | ORAL | Status: DC

## 2010-12-10 NOTE — Telephone Encounter (Signed)
Called and spoke with pt.  Pt states she just got results of TSH and SN told her to continue on of Synthroid.  Pt states she has been on Synthroid ever since SN changed this in June 2012 as a results of her TSH levels.  SN, please advise.  Thanks.   Allergies  Allergen Reactions  . Carbamazepine     REACTION: unspecified  . Codeine     REACTION: nause and vomiting  . Fentanyl     REACTION: nausea and vomiting  . Fluoxetine Hcl     REACTION: unspecified  . Hydromorphone Hcl     REACTION: intolerant  . Indomethacin     REACTION: unspecified  . Lisinopril     REACTION: cough  . Meperidine Hcl     REACTION: intolerant  . Morphine     REACTION: intolerant  . Prednisone     REACTION: pt had psychiatric episode w/ hosp - lost memory x3weeks  . Propranolol Hcl     REACTION: unspecified  . Tramadol Hcl     REACTION: unspecified

## 2010-12-10 NOTE — Telephone Encounter (Signed)
Per SN  Lab results---pt is to take the synthroid daily.  Ok to send in new rx for this if the pt needs this. thanks

## 2010-12-10 NOTE — Telephone Encounter (Signed)
Spoke with pt and notified of recsd per SN. Pt verbalized understanding and [facility] per her request.

## 2010-12-13 ENCOUNTER — Ambulatory Visit (INDEPENDENT_AMBULATORY_CARE_PROVIDER_SITE_OTHER)
Admission: RE | Admit: 2010-12-13 | Discharge: 2010-12-13 | Disposition: A | Discharge status: Home / Self Care | Payer: Medicare Other | Source: Ambulatory Visit

## 2010-12-13 DIAGNOSIS — M949 Disorder of cartilage, unspecified: Secondary | ICD-10-CM

## 2010-12-13 DIAGNOSIS — M899 Disorder of bone, unspecified: Secondary | ICD-10-CM

## 2010-12-20 ENCOUNTER — Encounter: Payer: Self-pay | Admitting: Pulmonary Disease

## 2010-12-26 ENCOUNTER — Other Ambulatory Visit: Payer: Self-pay | Admitting: Allergy

## 2010-12-26 MED ORDER — ALENDRONATE SODIUM 70 MG PO TABS: 70.0000 mg | ORAL_TABLET | ORAL | Status: DC

## 2011-01-02 ENCOUNTER — Encounter: Payer: Self-pay | Admitting: Pulmonary Disease

## 2011-01-30 ENCOUNTER — Telehealth: Payer: Self-pay | Admitting: Pulmonary Disease

## 2011-01-30 DIAGNOSIS — IMO0001 Reserved for inherently not codable concepts without codable children: Secondary | ICD-10-CM

## 2011-01-30 MED ORDER — AMOXICILLIN-POT CLAVULANATE 875-125 MG PO TABS: 1.0000 | ORAL_TABLET | Freq: Two times a day (BID) | ORAL | Status: AC

## 2011-01-30 NOTE — Telephone Encounter (Signed)
Per SN---ok to refer her to Dr. Corliss Skains for eval of FM and call in augmentin 875mg   #20  1 po bid.   thanks

## 2011-01-30 NOTE — Telephone Encounter (Signed)
I spoke with the pt and she has 2 concerns: The first is she states that at time of last OV Dr. Lenna Gilford mentioned referring her to Dr. [name] for her fibromyalgia but she has not heard anything from them. I do not see a referral for this either. Please advise.   Also the pt states x 2 weeks she has been having sinus congestion and blowing dark green mucus from her nose. She states she is using netti pot daily and taking allegra, benadryl, Flonase, and echinacea as directed without relief. Pt wants to know does Dr. Lenna Gilford have any other recs or does she need an antibiotic. Please advise. [place], CMA Allergies  Allergen Reactions  . Carbamazepine     REACTION: unspecified  . Codeine     REACTION: nause and vomiting  . Fentanyl     REACTION: nausea and vomiting  . Fluoxetine Hcl     REACTION: unspecified  . Hydromorphone Hcl     REACTION: intolerant  . Indomethacin     REACTION: unspecified  . Lisinopril     REACTION: cough  . Meperidine Hcl     REACTION: intolerant  . Morphine     REACTION: intolerant  . Prednisone     REACTION: pt had psychiatric episode w/ hosp - lost memory x3weeks  . Propranolol Hcl     REACTION: unspecified  . Tramadol Hcl     REACTION: unspecified

## 2011-01-30 NOTE — Telephone Encounter (Signed)
I spoke with pt and is aware referral for Dr. [name] and that Augmentin was called in. She is aware of the directions and aware Dr. [name] office will call w/ an apt. Nothing further was needed

## 2011-02-04 ENCOUNTER — Telehealth: Payer: Self-pay | Admitting: Pulmonary Disease

## 2011-02-04 MED ORDER — LIDOCAINE 5 % EX PTCH: MEDICATED_PATCH | CUTANEOUS | Status: DC

## 2011-02-04 MED ORDER — TRAMADOL HCL 50 MG PO TABS: ORAL_TABLET | ORAL | Status: DC

## 2011-02-04 MED ORDER — CHLORZOXAZONE 500 MG PO TABS: 500.0000 mg | ORAL_TABLET | Freq: Three times a day (TID) | ORAL | Status: AC | PRN

## 2011-02-04 NOTE — Telephone Encounter (Signed)
Per SN  Ok to send in tramadol 63m  #90  1 po tid prn pain, lidoderm patches  #30   1 patch on for 12 hours and then off for 12 hours, parafon forte  5067m 1 po tid prn muscle spasms  #90.  She really needs a visit to the ER for eval with xrays.    thanks

## 2011-02-04 NOTE — Telephone Encounter (Signed)
Spoke with pt. She states has been having lower back pain since 02/01/11- she described pain as "spasms" and states that she can barely move at all without extreme pain. Does not recall any injury prior to onset of pain. She states that she has tried tramadol and duragesic patch w/o any relief. Also has tried applying cold and heat to area w/o relief.  I had offered ov with TP and she refused, stating that she is unable to get into her car due to pain.  She is requesting that we call in muscle relaxer as well as refills on her lidoderm patches and tramadol. Please advise, thanks! Allergies  Allergen Reactions  . Carbamazepine     REACTION: unspecified  . Codeine     REACTION: nause and vomiting  . Fentanyl     REACTION: nausea and vomiting  . Fluoxetine Hcl     REACTION: unspecified  . Hydromorphone Hcl     REACTION: intolerant  . Indomethacin     REACTION: unspecified  . Lisinopril     REACTION: cough  . Meperidine Hcl     REACTION: intolerant  . Morphine     REACTION: intolerant  . Prednisone     REACTION: pt had psychiatric episode w/ hosp - lost memory x3weeks  . Propranolol Hcl     REACTION: unspecified  . Tramadol Hcl     REACTION: unspecified  ;l

## 2011-02-04 NOTE — Telephone Encounter (Signed)
Spoke with pt and notified of recs per SN Pt verbalized understanding and rxs were called to pharm 

## 2011-02-12 DIAGNOSIS — C4491 Basal cell carcinoma of skin, unspecified: Secondary | ICD-10-CM | POA: Diagnosis not present

## 2011-02-13 ENCOUNTER — Other Ambulatory Visit: Payer: Self-pay | Admitting: Pulmonary Disease

## 2011-02-13 ENCOUNTER — Ambulatory Visit (INDEPENDENT_AMBULATORY_CARE_PROVIDER_SITE_OTHER)
Admission: RE | Admit: 2011-02-13 | Discharge: 2011-02-13 | Disposition: A | Discharge status: Home / Self Care | Payer: Medicare Other | Source: Ambulatory Visit | Attending: Adult Health | Admitting: Adult Health

## 2011-02-13 ENCOUNTER — Encounter: Payer: Self-pay | Admitting: Adult Health

## 2011-02-13 ENCOUNTER — Ambulatory Visit (INDEPENDENT_AMBULATORY_CARE_PROVIDER_SITE_OTHER): Payer: Medicare Other | Admitting: Adult Health

## 2011-02-13 VITALS — BP 124/64 | HR 66 | Temp 98.7°F | Ht 62.0 in | Wt 190.4 lb

## 2011-02-13 DIAGNOSIS — M545 Low back pain: Secondary | ICD-10-CM | POA: Diagnosis not present

## 2011-02-13 DIAGNOSIS — M47817 Spondylosis without myelopathy or radiculopathy, lumbosacral region: Secondary | ICD-10-CM | POA: Diagnosis not present

## 2011-02-13 DIAGNOSIS — S335XXA Sprain of ligaments of lumbar spine, initial encounter: Secondary | ICD-10-CM

## 2011-02-13 DIAGNOSIS — S39012A Strain of muscle, fascia and tendon of lower back, initial encounter: Secondary | ICD-10-CM | POA: Insufficient documentation

## 2011-02-13 DIAGNOSIS — M62838 Other muscle spasm: Secondary | ICD-10-CM | POA: Diagnosis not present

## 2011-02-13 NOTE — Assessment & Plan Note (Signed)
Low back strain -will check LS spine film today  Unable to take nsaids /prednisone  Advised to use parafon forte As needed   Alternate ice and heat  Use tramadol  If not improving will need referral to ortho

## 2011-02-13 NOTE — Progress Notes (Signed)
Subjective:    Patient ID: [name], female    DOB: 1932-05-03, 78 y.o.   MRN: [id number]  HPI 78 y/o WF    ~  October 11, 2008:  she has updated list of all medical activities over the last 54mow/ [facility] DrGross (Surg- perirectal abscess), DrPlovsky (Psychiatry), [facility]... also saw the nurse practitioiner in July for UTI (Rx Cipro), TSH too low & Levothy dose derc slightly... today c/o sinus congestion & drainage of "grass green mucous" which improved w/ regular dosing of Claritin, Saline, Mucinex, Flonase... also c/o incr RLS symptoms and we discussed incr the Mirapex Rx... BMD here 10/10 showed osteopenia in the hips> continue Fosamax etc...  ~  June 08, 2009:  alot to get caught up on> [facility] doing well on Lialda, Questran & Nexium;  [facility]  [facility] [place]  [place]- doing well, same Rx, f/u 143yr etc...  she had a list of questions and minor somatic complaints (bruising, ecchymoses, gas, nasal crusts) which were discussed and dispatched in succession... f/u fasting labs today...  ~  December 05, 2009: 78 y/o WF  she has gained 18# in the last 76m70mowe reviewed diet recommendations, & an exercise program for her... [facility]...  BP controlled on meds & denies CP/ palpit/ ch in SOB, etc;  no cerebral ischemic symptoms on the ASA;  Chol stable on Lip40;  [facility] by DrBWilder GladeFM followed by DrDeveshwar;  she remains on Fosamax/ Calcium/ MVI/ VitD for her osteopenia w/ last BMD 10/10...  she is c/o some "crusts" on her right nares & she is concerned due to prev hx MRSA on left treated by DrBates w/ Doxy & she wants this antibiotic now for this new prob...   ~  Jun 06, 2010:  76mo75mo & she is quite excited over her 45 y51 daughter's new baby recently... She continues  [facility]/ mult eye-lid procedures s/p Merkle cell tumor removal in past...  She informs me that she has had follow up appt w/ DrBensimhon, DrHayes, & DrPlovsky and that all 3 physicians have "released" her... She is c/o a stuffy nose & rec to use Saline nasal mist; she requests Lidoderm patches; she is due for a routine follow up CXR (clear, NAD) & fasting blood work (all wnl x incr TSH & we will incr dose from 88==>100) but she never did incr the med & stayed on 88mc61m  ~  December 07, 2010:  76mo R5mo she had recent antibiotic Rx for sinusitis- now improved w/ clear drainage only; otherwise stable & f/u TSH today, still on 88mcg/43mse Synthroid is wnl at 2.63...    She saw DrJHayes for GI 9/12> hx Crohn's colitis, loose stools, gas, etc; treated w/ Lialda 3.6gm/d along w/ Questran daily; stable- continue same + probiotic...    [facility]> note reviewed...  02/13/2011 Acute OV 78 y/o  Complains of back discomfort  X 0days. Complains of  left side back pain rom waist down to bottom of the buttock, having to sleep in a chair. Has used chlorzoxazone and lidoderm without much help.  No urinary symptoms . No fever . No chest pain  or syncope.  No radicular symptoms.  Pain comes in spasms with movement. Worse with lying down or trying to get up.  No leg weakness.  Has 18 mon grandaughter that she has been picking up.  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  Hx of ENT Problem>  left nasal soft tissue abscess drained by DrBates- MRSA treated w/ drainage, IV antibiotics, then Doxy... she uses Claritin & Flonase for sinuses.  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  ~  [facility] 3[facility] showed atherosclerotic dis in Ao & coronaries, calcif AoV, left lung apex extra-pleural mass ~2cm, 87m LUL nodule, scat ground-glass opac, atelec, marked biliary ductal dil- consider MRCP... (Old films and CT Chest here 6/07 & 10/09 compared- not mentioned on 2007 scan but the 2009 scan noted lesion left apex 2.6 x 2.0cm <sl larger than 2007 scan in retrospect> ?neurogenic/ nerve sheath tumor). ~  Lesion not mentioned on routine CXRs> CXR 5/12 norm heart, clear lungs, NAD...  OBSTRUCTIVE SLEEP APNEA  - Hx mild OSA on Sleep Study by DrDohmeier... she never really used the CPAP and states that "now I'm fine off of it"... she does have some insomnia & uses BENEDRYL Prn.  HYPERTENSION (ICD-401.9) - controlled on COREG 295mid... BP=76/74 and pressures are good at home etc...  denies HA, visual changes, CP, palipit, dizziness, dyspnea, edema, etc... she has chronic fatigue symptoms.  CORONARY ARTERY DISEASE (ICD-414.00) - on COREG 2560md, & supposed to be on ASA 65m36m.. non-obstructive disease w/ cath 9/06 showing 30% LAD lesion, good LVF... neg Myoview 3/06 no ischemia, EF=73%... she is followed by DrBensimhon for Cards.  Hx of ATRIAL FLUTTER (ICD-427.32) - S/P catheter ablation in 2005 by DrKlein.  CEREBROVASCULAR DISEASE (ICD-437.9) - eval by DrWillis and DrBritz (Neurosurg @ [facility]) confirms 80% left middle cerebral art stenosis which is felt to be asymptomatic... they advise ASA 65mg36mand secondary risk factor modification...  HYPERLIPIDEMIA (ICD-272.4) - now on LIPITOR 40mg/91mFISH OIL Bid...  ~  FLP [place] Vytor10/80 showed TChol 130, TG 139, HDL 38, LDL 64... ~  FLP 4/[place] V[place]10/80 showed TChol 138, TG 102, HDL 62, LDL 56... ~  VytoriFreem[place]bur[place] 11/09 [place]: DrBensimhon started Lipitor 40/d. ~  FLP 6/10 on Lip40 showed TChol 148, TG 94, HDL 67, LDL 62 ~  FLP 6/11  on Lip40 showed TChol 186, TG 76, HDL 82, LDL 88 ~  FLP 5/12 on Lip40 showed TChol 156, TG 110, HDL 81, LDL 53  OBESITY (ICD-278.00) - she notes that her fibromyalgia makes it difficult for her to exercise. ~  weight 8/09 = 221# ~  weight 12/09 = 197# ~  weight 4/10 = 169#... great job w/ diet program. ~  weight 6/11 = 173# ~  weight 11/11 = 190#... we discussed diet + exercise. ~  Weight 5/12 = 189#       << GI - MANAGED BY DrJHAYES >> GASTROESOPHAGEAL REFLUX DISEASE (ICD-530.81) - followed by DrJHayes for GI on NEXIUM 40Bid... she has GERD, Hx gastric ulcer. DIVERTICULOSIS OF COLON (ICD-562.10) - hx diverticulitis in past. IRRITABLE BOWEL SYNDROME (ICD-564.1) -  DrHayes' eval w/ colon reported to show "ulcers" c/w Crohn's dis... COLONIC POLYPS - Hx of divertics w/ diverticulitis in the past and IBS, and hx of Colon Polyps w/ prev colonoscopy in 4/06... see 10/09 hosp [facility] and f/u colonoscopy w/ ?Crohn's... CROHN'S DISEASE (ICD-555.9) - on LIALDA 1.2gm- 3 tabs daily, QUESTRAN 2 scoops daily, & IMMODIUM Prn...       << ENDOCRINE - MANAGED BY DrBALAN >> HYPERPARATHYROIDISM, HX OF (ICD-V12.2) - she had hypercalcemia w/ right inferior parathyroid gland resected 11/07 by DrGerkin... HYPOTHYROIDISM (ICD-244.9) - on SYNTHROID 47mg daily... prev followed by DrBalan. ~  labs 6/07 showed TSH= 5.16... managed by DrBalan at that time. ~  labs here 7/10 on Levothy112 showed TSH= 0.16... rec> decr to 1/2 tab & f/u lab. ~  labs here 10/10 showed TSH= 10.04... rec> change to 745m/d. ~  labs here 6/11 on Levothy75 showed TSH= 6.84... rec> step up to 8848md. ~  Labs 5/12 on Levothy88 showed TSH= 13.14... rec to incr to 100m62m (she never did)... ~  Labs 11/12 on Levo88 showed TSH= 2.63... Continue same.       << RHEUM - MAMAGED BY DrDEVESHWAR >> FIBROMYALGIA (ICD-729.1) - followed by DrDeveshwar... she uses TRAMADOL & LIDODERM patches for pain. OSTEOPENIA (ICD-733.90) - on FOSAMAX 70mg22m  ca++, vits... prev followed by DrBalan... ~  last BMD 9/08 showed TScores -0.8 in Spine & -2.3 in right FemNeck... ~  f/u BMD 10/10 showed TScores invalid in Spine, & -2.2 in right FemNeck... continue Rx.  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  SYNCOPE (ICD-780.2) - [place]... ~ syncopal spell "altered level of consciusness" she remembers being on the plane and next in the ER...  their DC note indicates poss seizure but nothing to substantiate this (no seiz activ noted by anyone)...  ~  MRI Brain was neg x  max sinusitis... MRA however showed 80% stenosis in the left MCA... ~  CDoppler showed mild plaque, norm waveforms, no signif stenoses... ~  2DEcho showed basically WNL, borderline conc LVH, EF=55-60%...  RESTLESS LEG SYNDROME (ICD-333.94) - she's been on MIRAPEX 0.5mg Q78m. ~  10/10: c/o incr symptoms and instructed to incr Mirapex to 2 tabs...  PSYCHOSIS (ICD-298.9) - this was attributed to Prednisone therapy for the Crohn's dis... [place]iat[place]... [facility] [place] is weaning her off of the DEPAKOPerrinshe is off the prev Zoloft & Restoril...   Past Surgical History  Procedure Date  . Parathyroid adenoma surgery   . Cholecystectomy   . Moh';s surg left lower eye lid surgery and lid reconstruction surgery 2/10    Outpatient Encounter Prescriptions as of 02/13/2011  Medication Sig Dispense Refill  . alendronate (FOSAMAX) 70 MG tablet Take 1 tablet (70 mg total) by mouth every 7 (seven) days. Take with a full glass of water on an empty stomach.  4 tablet  3  . amoxicillin-clavulanate (AUGMENTIN) 875-125 MG per tablet Take 1 tablet by mouth 2 (two) times daily.  28 tablet  0  .  ascorbic acid (VITAMIN C) 500 MG tablet Take 500 mg by mouth daily.        . aspiMarland Kitchenin 81 MG tablet Take  81 mg by mouth daily.        . Biotin 5000 MCG TABS Take 1 tablet by mouth daily.        . Calcium Carb-Cholecalciferol 600-400 MG-UNIT TABS 2 tablets at noon and 3 tabs at 6 pm       . carvedilol (COREG) 25 MG tablet TAKE 1 TABLET TWICE DAILY  180 tablet  2  . chlorzoxazone (PARAFON FORTE DSC) 500 MG tablet Take 1 tablet (500 mg total) by mouth 3 (three) times daily as needed for muscle spasms.  90 tablet  0  . Cholecalciferol (VITAMIN D3) 2000 UNITS TABS Take by mouth. 1 capsule once a day       . cholestyramine (QUESTRAN) 4 GM/DOSE powder 2 scoops per day       . diphenhydrAMINE (BENADRYL) 25 MG tablet One tablet at bedtime as needed      . Evening Primrose Oil 500 MG CAPS 1 tablet twice a day      . fexofenadine (ALLEGRA) 180 MG tablet Take 180 mg by mouth daily.        . fish oil-omega-3 fatty acids 1000 MG capsule 2 capsules daily       . fluticasone (FLONASE) 50 MCG/ACT nasal spray 2 sprays by Nasal route daily.        Marland Kitchen guaifenesin (HUMIBID E) 400 MG TABS 3   times a day      . levothyroxine (SYNTHROID, LEVOTHROID) 88 MCG tablet Take 1 tablet (88 mcg total) by mouth daily.  90 tablet  3  . lidocaine (LIDODERM) 5 % Apply as directed  30 patch  0  . LIPITOR 40 MG tablet TAKE 1 TABLET BY MOUTH AT BEDTIME  90 tablet  2  . meclizine (ANTIVERT) 25 MG tablet Take 1 tablet every 6 hours as needed       . mesalamine (LIALDA) 1.2 G EC tablet Take 3 tablets by mouth once a day as directed by Dr. Amedeo Plenty       . Multiple Vitamin (MULTIVITAMIN PO) Once a day       . pramipexole (MIRAPEX) 0.5 MG tablet Take 1 tablet (0.5 mg total) by mouth 2 (two) times daily. 1  Tablet  by mouth once two times daily  180 tablet  3  . Simethicone (PHAZYME) 180 MG CAPS 3 times a day       . traMADol (ULTRAM) 50 MG tablet Take 1 tablet 3 times a day as needed (DO NOT EXCEED 3 TABS A DAY)  90 tablet  0    Allergies    Allergen Reactions  . Carbamazepine     REACTION: unspecified  . Codeine     REACTION: nause and vomiting  . Fentanyl     REACTION: nausea and vomiting  . Fluoxetine Hcl     REACTION: unspecified  . Hydromorphone Hcl     REACTION: intolerant  . Indomethacin     REACTION: unspecified  . Lisinopril     REACTION: cough  . Meperidine Hcl     REACTION: intolerant  . Morphine     REACTION: intolerant  . Prednisone     REACTION: pt had psychiatric episode w/ hosp - lost memory x3weeks  . Propranolol Hcl     REACTION: unspecified    Current Medications, Allergies, Past Medical History, Past Surgical History, Family History, and Social History were reviewed in [employer] record.     Review of Systems  Constitutional:   No  weight loss, night sweats,  Fevers, chills, fatigue, or  lassitude.  HEENT:   No headaches,  Difficulty swallowing,  Tooth/dental problems, or  Sore throat,                No sneezing, itching, ear ache, nasal congestion, post nasal drip,   CV:  No chest pain,  Orthopnea, PND, swelling in lower extremities, anasarca, dizziness, palpitations, syncope.   GI  No heartburn, indigestion, abdominal pain, nausea, vomiting, diarrhea, change in bowel habits, loss of appetite, bloody stools.   Resp: No shortness of breath with exertion or at rest.  No excess mucus, no productive cough,  No non-productive cough,  No coughing up of blood.  No change in color of mucus.  No wheezing.  No chest wall deformity  Skin: no rash or lesions.  GU: no dysuria, change in color of urine, no urgency or frequency.  No flank pain, no hematuria   MS:  No joint   swelling   Psych:  No change in mood or affect. No depression or anxiety.  No memory loss.      Objective:   Physical Exam     WD, Overweight, 78 y/o WF in NAD...  GENERAL:  Alert & oriented; pleasant & cooperative... HEENT:  [place]/AT, EOM-wnl, EACs-clear, TMs-wnl, NOSE-clear, THROAT-clear &  wnl.   NECK:  Supple w/ fairROM; no JVD; normal carotid impulses w/o bruits; no thyromegaly or nodules palpated; no lymphadenopathy. CHEST:  Clear to P & A; without wheezes/ rales/ or rhonchi. HEART:  Regular Rhythm; gr 1/6 SEM without rubs/ or gallops. ABDOMEN:  Obese soft & nontender; normal bowel sounds; no organomegaly or masses detected. EXT: without deformities, mild arthritic changes; no varicose veins/ +venous insuffic/ no edema... Tender along left lower lumbar sacral region, no redness or deformity noted. Previous midline low back scar -well healed. Neg SLR. DTR 2 + lower ext. , nml strength.  NEURO:  no focal neuro deficits... DERM:  No lesions noted; no rash etc...   Assessment & Plan:

## 2011-02-13 NOTE — Patient Instructions (Signed)
Alternate Ice and heat to low back .  Use Tramadol As needed  Pain -may make you sleepy.  Use Parafon Forte As needed  Muscle spasm.  I will call with xray results.  If not improving will need to refer to orthopedics  Please contact office for sooner follow up if symptoms do not improve or worsen or seek emergency care

## 2011-02-18 ENCOUNTER — Other Ambulatory Visit: Payer: Self-pay | Admitting: *Deleted

## 2011-02-18 MED ORDER — PROBIOTIC PO CAPS: 1.0000 | ORAL_CAPSULE | Freq: Every day | ORAL | Status: DC

## 2011-02-18 MED ORDER — LOPERAMIDE HCL 2 MG PO TABS: ORAL_TABLET | ORAL | Status: DC

## 2011-02-18 MED ORDER — GLYCERIN-POLYSORBATE 80 1-1 % OP SOLN: 1.0000 [drp] | Freq: Four times a day (QID) | OPHTHALMIC | Status: DC

## 2011-02-18 MED ORDER — OMEGA-3 FATTY ACIDS 1000 MG PO CAPS: ORAL_CAPSULE | ORAL | Status: DC

## 2011-02-18 MED ORDER — VITAMIN D3 50 MCG (2000 UT) PO TABS: 1.0000 | ORAL_TABLET | Freq: Every day | ORAL | Status: DC

## 2011-02-18 MED ORDER — TRAMADOL HCL 50 MG PO TABS: 50.0000 mg | ORAL_TABLET | Freq: Three times a day (TID) | ORAL | Status: DC | PRN

## 2011-02-18 MED ORDER — ASCORBIC ACID 500 MG PO TABS: 500.0000 mg | ORAL_TABLET | Freq: Every day | ORAL | Status: AC

## 2011-02-18 MED ORDER — CALCIUM-MAGNESIUM 500-250 MG PO TABS: 4.0000 | ORAL_TABLET | Freq: Two times a day (BID) | ORAL | Status: DC

## 2011-02-18 NOTE — Progress Notes (Signed)
Addended by: Parke Poisson E on: 02/18/2011 10:16 AM   Modules accepted: Orders

## 2011-02-20 DIAGNOSIS — M47817 Spondylosis without myelopathy or radiculopathy, lumbosacral region: Secondary | ICD-10-CM | POA: Diagnosis not present

## 2011-02-27 ENCOUNTER — Other Ambulatory Visit (HOSPITAL_COMMUNITY): Payer: Self-pay | Admitting: Specialist

## 2011-02-27 DIAGNOSIS — R102 Pelvic and perineal pain: Secondary | ICD-10-CM

## 2011-02-27 DIAGNOSIS — M549 Dorsalgia, unspecified: Secondary | ICD-10-CM

## 2011-03-05 ENCOUNTER — Encounter (HOSPITAL_COMMUNITY)
Admission: RE | Admit: 2011-03-05 | Discharge: 2011-03-05 | Disposition: A | Discharge status: Home / Self Care | Payer: Medicare Other | Source: Ambulatory Visit | Attending: Specialist | Admitting: Specialist

## 2011-03-05 DIAGNOSIS — N949 Unspecified condition associated with female genital organs and menstrual cycle: Secondary | ICD-10-CM | POA: Insufficient documentation

## 2011-03-05 DIAGNOSIS — M549 Dorsalgia, unspecified: Secondary | ICD-10-CM | POA: Diagnosis not present

## 2011-03-05 DIAGNOSIS — R102 Pelvic and perineal pain: Secondary | ICD-10-CM

## 2011-03-05 MED ORDER — TECHNETIUM TC 99M MEDRONATE IV KIT
25.0000 | PACK | Freq: Once | INTRAVENOUS | Status: AC | PRN
  Administered 2011-03-05: 25 via INTRAVENOUS

## 2011-03-12 ENCOUNTER — Telehealth: Payer: Self-pay | Admitting: *Deleted

## 2011-03-12 DIAGNOSIS — IMO0001 Reserved for inherently not codable concepts without codable children: Secondary | ICD-10-CM

## 2011-03-12 NOTE — Telephone Encounter (Signed)
Pt sent over a fax stating that she has been waiting for appt with Dr. [name] for an eval.  This order was put in the computer in jan 2013 and alida sent over the papers to her office and they were to  Contact the pt for an appt.  i will put in another order for this and make the pt aware.

## 2011-03-13 DIAGNOSIS — M545 Low back pain: Secondary | ICD-10-CM | POA: Diagnosis not present

## 2011-03-13 DIAGNOSIS — M47817 Spondylosis without myelopathy or radiculopathy, lumbosacral region: Secondary | ICD-10-CM | POA: Diagnosis not present

## 2011-03-13 DIAGNOSIS — M19079 Primary osteoarthritis, unspecified ankle and foot: Secondary | ICD-10-CM | POA: Diagnosis not present

## 2011-03-25 DIAGNOSIS — M47817 Spondylosis without myelopathy or radiculopathy, lumbosacral region: Secondary | ICD-10-CM | POA: Diagnosis not present

## 2011-03-25 DIAGNOSIS — M545 Low back pain: Secondary | ICD-10-CM | POA: Diagnosis not present

## 2011-03-27 ENCOUNTER — Ambulatory Visit: Payer: Medicare Other

## 2011-03-27 ENCOUNTER — Ambulatory Visit
Admission: RE | Admit: 2011-03-27 | Discharge: 2011-03-27 | Disposition: A | Discharge status: Home / Self Care | Payer: Medicare Other | Source: Ambulatory Visit | Attending: Radiation Oncology | Admitting: Radiation Oncology

## 2011-03-27 VITALS — BP 145/72 | HR 57 | Temp 97.4°F | Wt 185.7 lb

## 2011-03-27 DIAGNOSIS — D497 Neoplasm of unspecified behavior of endocrine glands and other parts of nervous system: Secondary | ICD-10-CM

## 2011-03-27 NOTE — Progress Notes (Signed)
States left eye tears and she has eye strain by the end of the day.  Dropping of lower lid.  Dr. Norlene Duel in [place] to perform procedure to repair lower lid.  Denies any pain

## 2011-03-27 NOTE — Progress Notes (Signed)
Followup note:  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 I last saw her in July 2012. She maintains her followup with Dr. Rufina Falco at [facility] and also Dr. Norlene Duel also at [facility]. She saw Dr. Neldon Labella last August, and she is getting ready for minor reconstructive surgery of her low left eyelid to correct a mild ectropion. Her corrected vision remains good. She does report chronic tearing of her left thigh and she uses over-the-counter Thera-tears. She no longer uses any ophthalmic ointment.  Physical examination: She is alert and oriented. On inspection of the left thigh there are radiation and surgical changes noted along the lower eyelid. There is a mild lower eyelid ectropion and absence of eyelashes. There is no significant telangiectasia. There is no palpable evidence for recurrent disease. There is no palpable facial, perirectal or, periparotid or neck adenopathy. Clear nerves II through XII intact.  Impression: Satisfactory progress with no evidence of local regional recurrence.  Plan: She will have surgery with Dr. Vickki Muff in the near future and see Dr. Neldon Labella again for a followup visit in August of 2013. I'll see her back for a followup visit in approximately one year. Her prognosis appears to be good at this point in time.

## 2011-04-03 ENCOUNTER — Other Ambulatory Visit: Payer: Self-pay | Admitting: Pulmonary Disease

## 2011-04-03 DIAGNOSIS — I10 Essential (primary) hypertension: Secondary | ICD-10-CM | POA: Diagnosis not present

## 2011-04-03 DIAGNOSIS — K219 Gastro-esophageal reflux disease without esophagitis: Secondary | ICD-10-CM | POA: Diagnosis not present

## 2011-04-03 DIAGNOSIS — E039 Hypothyroidism, unspecified: Secondary | ICD-10-CM | POA: Diagnosis not present

## 2011-04-03 DIAGNOSIS — E669 Obesity, unspecified: Secondary | ICD-10-CM | POA: Diagnosis not present

## 2011-04-04 DIAGNOSIS — K219 Gastro-esophageal reflux disease without esophagitis: Secondary | ICD-10-CM | POA: Diagnosis not present

## 2011-04-04 DIAGNOSIS — M545 Low back pain, unspecified: Secondary | ICD-10-CM | POA: Diagnosis not present

## 2011-04-04 DIAGNOSIS — G4733 Obstructive sleep apnea (adult) (pediatric): Secondary | ICD-10-CM | POA: Diagnosis not present

## 2011-04-04 DIAGNOSIS — M1990 Unspecified osteoarthritis, unspecified site: Secondary | ICD-10-CM | POA: Diagnosis not present

## 2011-04-04 DIAGNOSIS — R42 Dizziness and giddiness: Secondary | ICD-10-CM | POA: Diagnosis not present

## 2011-04-04 DIAGNOSIS — E785 Hyperlipidemia, unspecified: Secondary | ICD-10-CM | POA: Diagnosis not present

## 2011-04-04 DIAGNOSIS — Z882 Allergy status to sulfonamides status: Secondary | ICD-10-CM | POA: Diagnosis not present

## 2011-04-04 DIAGNOSIS — E039 Hypothyroidism, unspecified: Secondary | ICD-10-CM | POA: Diagnosis not present

## 2011-04-04 DIAGNOSIS — H02209 Unspecified lagophthalmos unspecified eye, unspecified eyelid: Secondary | ICD-10-CM | POA: Diagnosis not present

## 2011-04-04 DIAGNOSIS — Z8673 Personal history of transient ischemic attack (TIA), and cerebral infarction without residual deficits: Secondary | ICD-10-CM | POA: Diagnosis not present

## 2011-04-04 DIAGNOSIS — H02539 Eyelid retraction unspecified eye, unspecified lid: Secondary | ICD-10-CM | POA: Diagnosis not present

## 2011-04-04 DIAGNOSIS — H02059 Trichiasis without entropian unspecified eye, unspecified eyelid: Secondary | ICD-10-CM | POA: Diagnosis not present

## 2011-04-04 DIAGNOSIS — Z859 Personal history of malignant neoplasm, unspecified: Secondary | ICD-10-CM | POA: Diagnosis not present

## 2011-04-04 DIAGNOSIS — I10 Essential (primary) hypertension: Secondary | ICD-10-CM | POA: Diagnosis not present

## 2011-04-04 HISTORY — PX: OTHER SURGICAL HISTORY: SHX169

## 2011-04-08 DIAGNOSIS — M19079 Primary osteoarthritis, unspecified ankle and foot: Secondary | ICD-10-CM | POA: Diagnosis not present

## 2011-04-08 DIAGNOSIS — M47817 Spondylosis without myelopathy or radiculopathy, lumbosacral region: Secondary | ICD-10-CM | POA: Diagnosis not present

## 2011-04-08 DIAGNOSIS — M545 Low back pain: Secondary | ICD-10-CM | POA: Diagnosis not present

## 2011-04-18 ENCOUNTER — Other Ambulatory Visit: Payer: Self-pay | Admitting: Pulmonary Disease

## 2011-05-09 DIAGNOSIS — M461 Sacroiliitis, not elsewhere classified: Secondary | ICD-10-CM | POA: Diagnosis not present

## 2011-05-21 ENCOUNTER — Other Ambulatory Visit: Payer: Self-pay | Admitting: Pulmonary Disease

## 2011-06-07 ENCOUNTER — Ambulatory Visit (INDEPENDENT_AMBULATORY_CARE_PROVIDER_SITE_OTHER): Payer: Medicare Other | Admitting: Pulmonary Disease

## 2011-06-07 ENCOUNTER — Encounter: Payer: Self-pay | Admitting: Pulmonary Disease

## 2011-06-07 VITALS — BP 124/68 | HR 63 | Temp 97.1°F | Ht 62.0 in | Wt 181.8 lb

## 2011-06-07 DIAGNOSIS — I679 Cerebrovascular disease, unspecified: Secondary | ICD-10-CM | POA: Diagnosis not present

## 2011-06-07 DIAGNOSIS — K5090 Crohn's disease, unspecified, without complications: Secondary | ICD-10-CM

## 2011-06-07 DIAGNOSIS — I4892 Unspecified atrial flutter: Secondary | ICD-10-CM

## 2011-06-07 DIAGNOSIS — I2510 Atherosclerotic heart disease of native coronary artery without angina pectoris: Secondary | ICD-10-CM

## 2011-06-07 DIAGNOSIS — IMO0001 Reserved for inherently not codable concepts without codable children: Secondary | ICD-10-CM

## 2011-06-07 DIAGNOSIS — F419 Anxiety disorder, unspecified: Secondary | ICD-10-CM

## 2011-06-07 DIAGNOSIS — E785 Hyperlipidemia, unspecified: Secondary | ICD-10-CM

## 2011-06-07 DIAGNOSIS — I10 Essential (primary) hypertension: Secondary | ICD-10-CM

## 2011-06-07 DIAGNOSIS — E039 Hypothyroidism, unspecified: Secondary | ICD-10-CM

## 2011-06-07 DIAGNOSIS — K219 Gastro-esophageal reflux disease without esophagitis: Secondary | ICD-10-CM

## 2011-06-07 DIAGNOSIS — D497 Neoplasm of unspecified behavior of endocrine glands and other parts of nervous system: Secondary | ICD-10-CM

## 2011-06-07 DIAGNOSIS — K5730 Diverticulosis of large intestine without perforation or abscess without bleeding: Secondary | ICD-10-CM

## 2011-06-07 DIAGNOSIS — M949 Disorder of cartilage, unspecified: Secondary | ICD-10-CM

## 2011-06-07 DIAGNOSIS — R0600 Dyspnea, unspecified: Secondary | ICD-10-CM

## 2011-06-07 DIAGNOSIS — D126 Benign neoplasm of colon, unspecified: Secondary | ICD-10-CM

## 2011-06-07 DIAGNOSIS — G2581 Restless legs syndrome: Secondary | ICD-10-CM

## 2011-06-07 NOTE — Progress Notes (Addendum)
Subjective:    Patient ID: [name], female    DOB: 05-Jul-1932, 78 y.o.   MRN: [id number]  HPI 78 y/o WF here for a follow up visit... she has mult med problems and meds as noted below...   ~  October 11, 2008:  she has updated list of all medical activities over the last 9mow/ [facility] DrGross (Surg- perirectal abscess), DrPlovsky (Psychiatry), [facility]... also saw the nurse practitioiner in July for UTI (Rx Cipro), TSH too low & Levothy dose derc slightly... today c/o sinus congestion & drainage of "grass green mucous" which improved w/ regular dosing of Claritin, Saline, Mucinex, Flonase... also c/o incr RLS symptoms and we discussed incr the Mirapex Rx... BMD here 10/10 showed osteopenia in the hips> continue Fosamax etc...  ~  June 08, 2009:  alot to get caught up on> [facility] doing well on Lialda, Questran & Nexium;  [facility]  [facility] [place]  Cardiac f/u DBelle Fourche12/10- doing well, same Rx, f/u 126yr etc...  she had a list of questions and minor somatic complaints (bruising, ecchymoses, gas, nasal crusts) which were discussed and dispatched in succession... f/u fasting labs today...  ~  December 05, 2009:  she has gained 18# in the last 71m71mowe reviewed diet recommendations, & an exercise program for her... [facility]...  BP controlled on meds & denies CP/ palpit/ ch in SOB, etc;  no cerebral ischemic symptoms on the ASA;  Chol stable on Lip40;  [facility] by DrBWilder GladeFM followed by DrDeveshwar;  she remains on Fosamax/ Calcium/ MVI/ VitD for her osteopenia w/ last BMD 10/10...  she is c/o some "crusts" on her right nares & she is concerned due to prev hx MRSA on left treated by DrBates w/ Doxy & she wants this antibiotic now for this new prob...   ~  Jun 06, 2010:  71mo22mo &  she is quite excited over her 45 y54 daughter's new baby recently... [facility]/ mult eye-lid procedures s/p Merkle cell tumor removal in past...  She informs me that she has had follow up appt w/ DrBensimhon, DrHayes, & DrPlovsky and that all 3 physicians have "released" her... She is c/o a stuffy nose & rec to use Saline nasal mist; she requests Lidoderm patches; she is due for a routine follow up CXR (clear, NAD) & fasting blood work (all wnl x incr TSH & we will incr dose from 88==>100) but she never did incr the med & stayed on 88mc39m  ~  December 07, 2010:  71mo R5mo she had recent antibiotic Rx for sinusitis- now improved w/ clear drainage only; otherwise stable & f/u TSH today, still on 88mcg/21mse Synthroid is wnl at 2.63...    She saw DrJHayes for GI 9/12> hx Crohn's colitis, loose stools, gas, etc; treated w/ Lialda 3.6gm/d along w/ Questran daily; stable- continue same + probiotic...    [facility]> note reviewed...  ~  Jun 07, 2011:  71mo ROV471moolly is c/o some LBP- eval by DrNitka Shara Blazing"it's mostly arthritis" she has [facility];  She stays active caring  for her 66moago grand daughter; she's been under incr stress w/ husb undergoing AVR & CABG by DrOwens (doing well now)...    She continues to be followed by DrShockley & [facility] & DrMurray here (XRT) for her Merkel cell tumor left lower lid w/ mult surgeries (Mohs, the reconstruction), but she notes lots of tearing, photosens, etc...     We reviewed prob list, meds, xrays and labs> see below>> BMD here 12/12 showed TScores+0.8 in spine (osteophytes & scoliosis), and -2.1 in left [facility]13 showed mild scoliosis & degen changes, vasc calcif & GB surg clips... Nuclear Bone Scan by DShara Blazing2/13 showed diffuse degenerative uptake, no fracture or bone lesions... 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 ~  She continues periodic follow up by DrShockley ([facility], DrFowler ([facility], DrMurray ([facility])...  Hx of ENT Problem>  left nasal soft tissue abscess drained by DrBates- MRSA treated w/ drainage, IV antibiotics, then Doxy... she uses Claritin & Flonase for sinuses.  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  ~  [facility] showed atherosclerotic dis in Ao & coronaries, calcif AoV, left lung apex extra-pleural mass ~2cm, 476mLUL nodule, scat ground-glass opac, atelec, marked biliary ductal dil- consider MRCP... (Old films and CT Chest here 6/07 & 10/09 compared- not mentioned on 2007 scan but the 2009 scan noted lesion left apex 2.6 x 2.0cm <sl larger than 2007 scan in retrospect> ?neurogenic/ nerve sheath tumor). ~  Lesion not mentioned on routine CXRs> CXR 5/12 norm heart, clear lungs, NAD...  OBSTRUCTIVE SLEEP APNEA  - Hx mild OSA on Sleep Study by DrDohmeier... she never really used the CPAP and states that "now I'm fine off of it"... she does have some insomnia & uses BENEDRYL Prn.  HYPERTENSION (ICD-401.9) - controlled on COREG 2524md...  ~  11/12:  BP=122/60 and pressures are good at home etc;  denies HA, visual changes, CP, palipit, dizziness, dyspnea, edema, etc; she has chronic fatigue symptoms. ~  5/13:  BP= 124/68 & she remains largely asymptomatic...  CORONARY ARTERY DISEASE (ICD-414.00) - on COREG 6m57m, & supposed to be on ASA 81mg11m. non-obstructive disease w/ cath 9/06 showing 30% LAD lesion, good LVF... neg Myoview 3/06 no ischemia, EF=73%... she is followed by  DrBensimhon for Cards.  Hx of ATRIAL FLUTTER (ICD-427.32) - S/P catheter ablation in 2005 by DrKlein.  CEREBROVASCULAR DISEASE (ICD-437.9) - eval by DrWillis and DrBritz (Neurosurg @ [facility]) confirms 80% left middle cerebral art stenosis which is felt to be asymptomatic... they advise ASA 81mg/41mnd secondary risk factor modification...  HYPERLIPIDEMIA (ICD-272.4) - now on LIPITOR 40mg/d29mISH OIL Bid...  ~  FLP [place] showed TChol 130, TG 139, HDL 38, LDL 64... ~  FLP [place]/80 showed TChol 138, TG 102, HDL 62, LDL 56... ~  [place] DrBensimhon started Lipitor 40/d. ~  FLP 6/10 on Lip40 showed TChol 148, TG 94, HDL 67, LDL 62 ~  FLP 6/11 on Lip40 showed TChol 186,  TG 76, HDL 82, LDL 88 ~  FLP 5/12 on Lip40 showed TChol 156, TG 110, HDL 81, LDL 53 ~  FLP 6/13 on Lip40 showed TChol 142, TG 93, HDL 79, LDL 45  OBESITY (ICD-278.00) - she notes that her fibromyalgia makes it difficult for her to exercise. ~  weight 8/09 = 221# ~  weight 12/09 = 197# ~  weight 4/10 = 169#... great job w/ diet program. ~  weight 6/11 = 173# ~  weight 11/11 = 190#... we discussed diet + exercise. ~  Weight 5/12 = 189# ~  Weight 5/13 = 182#       << GI - Prev MANAGED BY DrJHAYES ("He released me")>> GASTROESOPHAGEAL REFLUX DISEASE (ICD-530.81) - followed by DrJHayes for GI on NEXIUM 40Bid... she has GERD, Hx gastric ulcer. DIVERTICULOSIS OF COLON (ICD-562.10) - hx diverticulitis in past... She takes PROBIOTIC & SIMETHACONE... IRRITABLE BOWEL SYNDROME (ICD-564.1) - DrHayes' eval w/ colon reported to show "ulcers" c/w Crohn's dis... COLONIC POLYPS - Hx of divertics w/ diverticulitis in the past and IBS, and hx of Colon Polyps w/ prev colonoscopy in 4/06... see [facility] hosp [facility] and f/u colonoscopy w/ ?Crohn's... CROHN'S DISEASE (ICD-555.9) - on LIALDA 1.2gm- 3 tabs daily, QUESTRAN 2 scoops daily, & IMMODIUM Prn...       <<  ENDOCRINE - Prev MANAGED BY DrBALAN >> HYPERPARATHYROIDISM, HX OF (ICD-V12.2) - she had hypercalcemia w/ right inferior parathyroid gland resected 11/07 by DrGerkin... HYPOTHYROIDISM (ICD-244.9) - on SYNTHROID 101mg daily... prev followed by DrBalan. ~  labs 6/07 showed TSH= 5.16... managed by DrBalan at that time. ~  labs here 7/10 on Levothy112 showed TSH= 0.16... rec> decr to 1/2 tab & f/u lab. ~  labs here 10/10 showed TSH= 10.04... rec> change to 765m/d. ~  labs here 6/11 on Levothy75 showed TSH= 6.84... rec> step up to 8864md. ~  Labs 5/12 on Levothy88 showed TSH= 13.14... rec to incr to 100m47m (she never did)... ~  Labs 11/12 on Levo88 showed TSH= 2.63... Continue same. ~  Labs 5/13 on Levo88 showed TSH= 3.10       << RHEUM - MAMAGED BY DrDEVESHWAR >> FIBROMYALGIA (ICD-729.1) - followed by DrDeveshwar... she uses TRAMADOL & LIDODERM patches for pain. OSTEOPENIA (ICD-733.90) - on FOSAMAX 70mg58m ca++, vits... prev followed by DrBalan... ~  last BMD 9/08 showed TScores -0.8 in Spine & -2.3 in right FemNeck... ~  f/u BMD 10/10 showed TScores invalid in Spine, & -2.2 in right FemNeck... continue Rx. ~  BMD here 12/12 showed TScores+0.8 in spine (osteophytes & scoliosis), and -2.1 in left FemNeck... Continue same.  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  SYNCOPE (ICD-780.2) - [place]... ~ syncopal spell "altered level of consciusness" she remembers being on the plane and next in the ER...  their DC note indicates poss seizure but nothing to substantiate this (no seiz activ noted by anyone)...  ~  MRI Brain was neg x  max sinusitis... MRA however showed 80% stenosis in the left MCA... ~  CDoppler showed mild plaque, norm waveforms, no signif stenoses... ~  2DEcho showed  basically WNL, borderline conc LVH, EF=55-60%...  RESTLESS LEG SYNDROME (ICD-333.94) - she's been on MIRAPEX 0.5mg Q67m. ~  10/10: c/o incr symptoms and  instructed to incr Mirapex to 2 tabs...  PSYCHOSIS (ICD-298.9) - this was attributed to Prednisone therapy for the Crohn's dis... hosp in [place] and mult meds discontinued and changed by Psychiatrists... now followed by [facility] and he is off the prev Depakote & Keppra... & she is also off the prev Zoloft & Restoril...   Past Surgical History  Procedure Date  . Parathyroid adenoma surgery   . Cholecystectomy   . Moh';s surg left lower eye lid surgery and lid reconstruction surgery 2/10    Outpatient Encounter Prescriptions as of 06/07/2011  Medication Sig Dispense Refill  . ascorbic acid (SUPER C COMPLEX) 500 MG tablet Take 1 tablet (500 mg total) by mouth daily.      Marland Kitchen aspirin 81 MG tablet Take 81 mg by mouth daily.        . Biotin 5000 MCG TABS Take 1 tablet by mouth daily.        . Calcium-Magnesium 500-250 MG TABS Take 2 tablets by mouth daily.      . carvedilol (COREG) 25 MG tablet       . Cholecalciferol (VITAMIN D3) 2000 UNITS TABS Take 1 tablet by mouth daily.      . cholestyramine (QUESTRAN) 4 GM/DOSE powder 2 scoops per day       . diphenhydrAMINE (BENADRYL) 25 MG tablet One tablet at bedtime as needed      . Evening Primrose Oil 500 MG CAPS 1 tablet twice a day      . fish oil-omega-3 fatty acids 1000 MG capsule 3 capsules daily      . fluticasone (FLONASE) 50 MCG/ACT nasal spray 2 sprays by Nasal route daily.        . Glycerin-Polysorbate 80 (REFRESH DRY EYE THERAPY) 1-1 % SOLN Apply 1 drop to eye 4 (four) times daily.      Marland Kitchen guaifenesin (HUMIBID E) 400 MG TABS 1 TABLET BY MOUTH TWO TIMES DAILY      . levothyroxine (SYNTHROID, LEVOTHROID) 88 MCG tablet Take 1 tablet (88 mcg total) by mouth daily.  90 tablet  3  . lidocaine (LIDODERM) 5 % Apply as directed  30 patch  0  . LIPITOR 40 MG tablet TAKE 1 TABLET BY MOUTH AT  BEDTIME  90 tablet  2  . loperamide (IMODIUM A-D) 2 MG tablet As needed      . meclizine (ANTIVERT) 25 MG tablet Take 1 tablet every 6 hours as needed       . mesalamine (LIALDA) 1.2 G EC tablet Take 3 tablets by mouth once a day as directed by Dr. Amedeo Plenty       . Multiple Vitamin (MULTIVITAMIN PO) Once a day       . pramipexole (MIRAPEX) 0.5 MG tablet Take 1 tablet (0.5 mg total) by mouth 2 (two) times daily. 1  Tablet  by mouth once two times daily  180 tablet  3  . PROBIOTIC CAPS Take 1 capsule by mouth daily.      . Simethicone (PHAZYME) 180 MG CAPS 3 times a day       . traMADol (ULTRAM) 50 MG tablet Take 1 tablet (50 mg total) by mouth every 8 (eight) hours as needed for pain.  270 tablet  1  . DISCONTD: Calcium-Magnesium 500-250 MG TABS Take 4 tablets by mouth 2 (two) times daily.      Marland Kitchen DISCONTD: carvedilol (COREG) 25 MG tablet TAKE 1 TABLET TWICE DAILY  180 tablet  2  . DISCONTD: alendronate (FOSAMAX) 70  MG tablet Take 1 tablet (70 mg total) by mouth every 7 (seven) days. Take with a full glass of water on an empty stomach.  4 tablet  3  . DISCONTD: fexofenadine (ALLEGRA) 180 MG tablet Take 180 mg by mouth daily.        Marland Kitchen DISCONTD: levothyroxine (SYNTHROID, LEVOTHROID) 100 MCG tablet TAKE 1 TABLET DAILY  90 tablet  2    Allergies  Allergen Reactions  . Bacitracin     REACTION: redness, rash, severe itching  . Carbamazepine     Intolerant to Tegretol.  REACTION: dizziness, blurred vision, nausea, HA, insomnia  . Codeine     REACTION: nause and vomiting  . Diclofenac Sodium     REACTION: severe headache  . Fentanyl     REACTION: chest pain, HBP, with second patch experienced vertigo and nausea  . Fluoxetine Hcl     REACTION: dizziness, blurred vision, nausea, HA, insomnia  . Hydromorphone Hcl     REACTION: intolerant to dilaudid with nausea, vomiting  . Indomethacin     REACTION: dizziness, blurred vision, nausea, HA, insomnia  . Lisinopril     REACTION: cough  . Meperidine  Hcl     REACTION: intolerant to demerol with nausea, vomiting  . Morphine     REACTION: n/v  . Prednisone     REACTION: pt had psychiatric episode w/ hosp - lost memory x3weeks  . Pregabalin     REACTION: severe restless legs, insomnia  . Propranolol Hcl     Intolerant to Inderal.  REACTION: dizziness, blurred vision, nausea, HA, insomnia  . Ropinirole Hcl     REACTION: severe restless legs and insomnia  . Sulfamethoxazole     REACTION: nausea, gas, loose stools  . Tramadol     REACTION: dizziness, blurred vision, nausea, HA, insomnia.     Current Medications, Allergies, Past Medical History, Past Surgical History, Family History, and Social History were reviewed in [employer] record.     Review of Systems        See HPI - all other systems neg except as noted... The patient complains of weight gain, dyspnea on exertion, and muscle weakness.  The patient denies anorexia, fever, weight loss, vision loss, decreased hearing, hoarseness, chest pain, syncope, peripheral edema, prolonged cough, headaches, hemoptysis, abdominal pain, melena, hematochezia, severe indigestion/heartburn, hematuria, incontinence, suspicious skin lesions, transient blindness, difficulty walking, depression, unusual weight change, abnormal bleeding, enlarged lymph nodes, angioedema, and breast masses.    Objective:   Physical Exam     WD, Overweight, 78 y/o WF in NAD...  GENERAL:  Alert & oriented; pleasant & cooperative... HEENT:  [place]/AT, EOM-wnl, EACs-clear, TMs-wnl, NOSE-clear, THROAT-clear & wnl. ** s/p mult surg left lower lid- sl red, improved... NECK:  Supple w/ fairROM; no JVD; normal carotid impulses w/o bruits; no thyromegaly or nodules palpated; no lymphadenopathy. CHEST:  Clear to P & A; without wheezes/ rales/ or rhonchi. HEART:  Regular Rhythm; gr 1/6 SEM without rubs/ or gallops. ABDOMEN:  Obese soft & nontender; normal bowel sounds; no organomegaly or masses  detected. EXT: without deformities, mild arthritic changes; no varicose veins/ +venous insuffic/ no edema... NEURO:  CN's intact; no focal neuro deficits... DERM:  No lesions noted; no rash etc...  RADIOLOGY DATA:  Reviewed in the EPIC EMR & discussed w/ the patient...  LABORATORY DATA:  Reviewed in the EPIC EMR & discussed w/ the patient...   Assessment & Plan:   Ophthalmology>  [facility] at [facility] by DrFowler  w/ numerous surg procedures for her Merkel cell tumor of the left lower eyelid...  HBP>  Controlled on the Coreg but needs better low sodium wt reducing diet...  Cardiac> CAD, Hx AFlutter s/p ablation>  Stable on ASA + the Coreg, continue same...  HYPERLIPID>  Stable on the Lip40...  Obesity> she has not been successful losing the wt, we reviewed wt reducing diet + exercise...  GI>  Followed by [facility] as noted for her Crohn's etc & she has been doing well, "he released me" she says...  HYPOTHYROID>  TSH is elev at 13 on the Synthroid88 (we will confirm taking it daily) & needs incr dose to 174mg/d...  Rheum>  FM/ osteopenia>  Followed by DLadene Artistfor Rheum, continue her regimen...   Patient's Medications  New Prescriptions   No medications on file  Previous Medications   ASCORBIC ACID (SUPER C COMPLEX) 500 MG TABLET    Take 1 tablet (500 mg total) by mouth daily.   ASPIRIN 81 MG TABLET    Take 81 mg by mouth daily.     BIOTIN 5000 MCG TABS    Take 1 tablet by mouth daily.     CHOLECALCIFEROL (VITAMIN D3) 2000 UNITS TABS    Take 1 tablet by mouth daily.   CHOLESTYRAMINE (QUESTRAN) 4 GM/DOSE POWDER    2 scoops per day    DIPHENHYDRAMINE (BENADRYL) 25 MG TABLET    One tablet at bedtime as needed   EVENING PRIMROSE OIL 500 MG CAPS    1 tablet twice a day   FISH OIL-OMEGA-3 FATTY ACIDS 1000 MG CAPSULE    3 capsules daily   FLUTICASONE (FLONASE) 50 MCG/ACT NASAL SPRAY    2 sprays by Nasal route daily.     GLYCERIN-POLYSORBATE 80 (REFRESH DRY EYE THERAPY) 1-1 % SOLN     Apply 1 drop to eye 4 (four) times daily.   GUAIFENESIN (HUMIBID E) 400 MG TABS    1 TABLET BY MOUTH TWO TIMES DAILY   LEVOTHYROXINE (SYNTHROID, LEVOTHROID) 88 MCG TABLET    Take 1 tablet (88 mcg total) by mouth daily.   LIDOCAINE (LIDODERM) 5 %    Apply as directed   LIPITOR 40 MG TABLET    TAKE 1 TABLET BY MOUTH AT BEDTIME   LOPERAMIDE (IMODIUM A-D) 2 MG TABLET    As needed   MECLIZINE (ANTIVERT) 25 MG TABLET    Take 1 tablet every 6 hours as needed    MESALAMINE (LIALDA) 1.2 G EC TABLET    Take 3 tablets by mouth once a day as directed by Dr. HAmedeo Plenty   MULTIPLE VITAMIN (MULTIVITAMIN PO)    Once a day    PRAMIPEXOLE (MIRAPEX) 0.5 MG TABLET    Take 1 tablet (0.5 mg total) by mouth 2 (two) times daily. 1  Tablet  by mouth once two times daily   PROBIOTIC CAPS    Take 1 capsule by mouth daily.   SIMETHICONE (PHAZYME) 180 MG CAPS    3 times a day    TRAMADOL (ULTRAM) 50 MG TABLET    Take 1 tablet (50 mg total) by mouth every 8 (eight) hours as needed for pain.  Modified Medications   Modified Medication Previous Medication   CALCIUM-MAGNESIUM 500-250 MG TABS Calcium-Magnesium 500-250 MG TABS      Take 2 tablets by mouth daily.    Take 4 tablets by mouth 2 (two) times daily.   CARVEDILOL (COREG) 25 MG TABLET carvedilol (COREG) 25 MG tablet  TAKE 1 TABLET TWICE DAILY  Discontinued Medications   ALENDRONATE (FOSAMAX) 70 MG TABLET    Take 1 tablet (70 mg total) by mouth every 7 (seven) days. Take with a full glass of water on an empty stomach.   FEXOFENADINE (ALLEGRA) 180 MG TABLET    Take 180 mg by mouth daily.     LEVOTHYROXINE (SYNTHROID, LEVOTHROID) 100 MCG TABLET    TAKE 1 TABLET DAILY

## 2011-06-07 NOTE — Patient Instructions (Signed)
Today we updated your med list in our EPIC system...    Continue your current medications the same...  Please return to our lab one morning next week for your FASTING blood work...    We will call you w/ the results when they return...  Call for any questions...  Let's plan a follow up recheck in 6 months.Rebekah KitchenMarland Hayes

## 2011-06-12 ENCOUNTER — Other Ambulatory Visit (INDEPENDENT_AMBULATORY_CARE_PROVIDER_SITE_OTHER): Payer: Medicare Other

## 2011-06-12 DIAGNOSIS — F419 Anxiety disorder, unspecified: Secondary | ICD-10-CM

## 2011-06-12 DIAGNOSIS — F411 Generalized anxiety disorder: Secondary | ICD-10-CM

## 2011-06-12 DIAGNOSIS — R0609 Other forms of dyspnea: Secondary | ICD-10-CM | POA: Diagnosis not present

## 2011-06-12 DIAGNOSIS — D126 Benign neoplasm of colon, unspecified: Secondary | ICD-10-CM

## 2011-06-12 DIAGNOSIS — M949 Disorder of cartilage, unspecified: Secondary | ICD-10-CM | POA: Diagnosis not present

## 2011-06-12 DIAGNOSIS — E785 Hyperlipidemia, unspecified: Secondary | ICD-10-CM | POA: Diagnosis not present

## 2011-06-12 DIAGNOSIS — M899 Disorder of bone, unspecified: Secondary | ICD-10-CM

## 2011-06-12 DIAGNOSIS — R0600 Dyspnea, unspecified: Secondary | ICD-10-CM

## 2011-06-12 DIAGNOSIS — R0989 Other specified symptoms and signs involving the circulatory and respiratory systems: Secondary | ICD-10-CM | POA: Diagnosis not present

## 2011-06-12 DIAGNOSIS — I10 Essential (primary) hypertension: Secondary | ICD-10-CM

## 2011-06-12 LAB — CBC WITH DIFFERENTIAL/PLATELET
Basophils Absolute: 0 10*3/uL (ref 0.0–0.1)
Eosinophils Relative: 1.6 % (ref 0.0–5.0)
Monocytes Relative: 9 % (ref 3.0–12.0)
Neutrophils Relative %: 49.2 % (ref 43.0–77.0)
Platelets: 140 10*3/uL — ABNORMAL LOW (ref 150.0–400.0)
WBC: 5.1 10*3/uL (ref 4.5–10.5)

## 2011-06-12 LAB — HEPATIC FUNCTION PANEL
ALT: 24 U/L (ref 0–35)
AST: 27 U/L (ref 0–37)
Albumin: 3.7 g/dL (ref 3.5–5.2)
Total Protein: 6.7 g/dL (ref 6.0–8.3)

## 2011-06-12 LAB — BASIC METABOLIC PANEL
BUN: 11 mg/dL (ref 6–23)
CO2: 30 mEq/L (ref 19–32)
Chloride: 107 mEq/L (ref 96–112)
GFR: 93.94 mL/min (ref >60.00)
Glucose, Bld: 87 mg/dL (ref 70–99)
Potassium: 5 mEq/L (ref 3.5–5.1)

## 2011-06-12 LAB — LIPID PANEL
Cholesterol: 142 mg/dL (ref 0–200)
VLDL: 18.6 mg/dL (ref 0.0–40.0)

## 2011-06-12 LAB — TSH: TSH: 3.1 u[IU]/mL (ref 0.35–5.50)

## 2011-06-13 LAB — VITAMIN D 25 HYDROXY (VIT D DEFICIENCY, FRACTURES): Vit D, 25-Hydroxy: 47 ng/mL (ref 30–89)

## 2011-07-03 DIAGNOSIS — H16219 Exposure keratoconjunctivitis, unspecified eye: Secondary | ICD-10-CM | POA: Diagnosis not present

## 2011-07-08 ENCOUNTER — Other Ambulatory Visit (HOSPITAL_COMMUNITY): Payer: Self-pay | Admitting: Internal Medicine

## 2011-07-08 ENCOUNTER — Other Ambulatory Visit: Payer: Self-pay | Admitting: Pulmonary Disease

## 2011-08-07 DIAGNOSIS — Z1231 Encounter for screening mammogram for malignant neoplasm of breast: Secondary | ICD-10-CM | POA: Diagnosis not present

## 2011-08-07 DIAGNOSIS — Z124 Encounter for screening for malignant neoplasm of cervix: Secondary | ICD-10-CM | POA: Diagnosis not present

## 2011-08-07 DIAGNOSIS — Z1212 Encounter for screening for malignant neoplasm of rectum: Secondary | ICD-10-CM | POA: Diagnosis not present

## 2011-08-20 DIAGNOSIS — C4491 Basal cell carcinoma of skin, unspecified: Secondary | ICD-10-CM | POA: Diagnosis not present

## 2011-08-30 DIAGNOSIS — H02539 Eyelid retraction unspecified eye, unspecified lid: Secondary | ICD-10-CM | POA: Diagnosis not present

## 2011-08-30 DIAGNOSIS — H16109 Unspecified superficial keratitis, unspecified eye: Secondary | ICD-10-CM | POA: Diagnosis not present

## 2011-08-30 DIAGNOSIS — H02209 Unspecified lagophthalmos unspecified eye, unspecified eyelid: Secondary | ICD-10-CM | POA: Diagnosis not present

## 2011-08-30 DIAGNOSIS — H02059 Trichiasis without entropian unspecified eye, unspecified eyelid: Secondary | ICD-10-CM | POA: Diagnosis not present

## 2011-09-06 DIAGNOSIS — Z23 Encounter for immunization: Secondary | ICD-10-CM | POA: Diagnosis not present

## 2011-09-11 DIAGNOSIS — M461 Sacroiliitis, not elsewhere classified: Secondary | ICD-10-CM | POA: Diagnosis not present

## 2011-09-20 DIAGNOSIS — H16109 Unspecified superficial keratitis, unspecified eye: Secondary | ICD-10-CM | POA: Diagnosis not present

## 2011-10-04 ENCOUNTER — Other Ambulatory Visit: Payer: Self-pay | Admitting: Internal Medicine

## 2011-10-16 DIAGNOSIS — M5430 Sciatica, unspecified side: Secondary | ICD-10-CM | POA: Diagnosis not present

## 2011-11-01 ENCOUNTER — Ambulatory Visit (INDEPENDENT_AMBULATORY_CARE_PROVIDER_SITE_OTHER): Payer: Medicare Other | Admitting: Cardiology

## 2011-11-01 ENCOUNTER — Encounter: Payer: Self-pay | Admitting: Cardiology

## 2011-11-01 VITALS — BP 153/66 | HR 71 | Ht 62.0 in | Wt 178.8 lb

## 2011-11-01 DIAGNOSIS — I4892 Unspecified atrial flutter: Secondary | ICD-10-CM

## 2011-11-01 DIAGNOSIS — I25810 Atherosclerosis of coronary artery bypass graft(s) without angina pectoris: Secondary | ICD-10-CM | POA: Diagnosis not present

## 2011-11-01 NOTE — Progress Notes (Signed)
HPI The patient presents for routine followup of hypertension and cardiovascular risk factors. She has seen Dr. Haroldine Laws periodically over the years. He's had atrial flutter status post previous ablation. However, she's had no recurrence palpitations in many years. She's had blood pressure is well controlled. I did see an echocardiogram from 2009 which was fairly unremarkable. Catheterization in 2006 demonstrated only 30% LAD stenosis.  Since she was last seen she's had no new cardiovascular complaints. She denies any chest pressure, neck or arm discomfort. She's had no palpitations, presyncope or syncope. She has had no shortness of breath, PND or orthopnea. She unfortunately doesn't exercise very much however. She does some minimal household chores.  Allergies  Allergen Reactions  . Bacitracin     REACTION: redness, rash, severe itching  . Carbamazepine     Intolerant to Tegretol.  REACTION: dizziness, blurred vision, nausea, HA, insomnia  . Codeine     REACTION: nause and vomiting  . Diclofenac Sodium     REACTION: severe headache  . Fentanyl     REACTION: chest pain, HBP, with second patch experienced vertigo and nausea  . Fluoxetine Hcl     REACTION: dizziness, blurred vision, nausea, HA, insomnia  . Hydromorphone Hcl     REACTION: intolerant to dilaudid with nausea, vomiting  . Indomethacin     REACTION: dizziness, blurred vision, nausea, HA, insomnia  . Lisinopril     REACTION: cough  . Meperidine Hcl     REACTION: intolerant to demerol with nausea, vomiting  . Morphine     REACTION: n/v  . Prednisone     REACTION: pt had psychiatric episode w/ hosp - lost memory x3weeks  . Pregabalin     REACTION: severe restless legs, insomnia  . Propranolol Hcl     Intolerant to Inderal.  REACTION: dizziness, blurred vision, nausea, HA, insomnia  . Ropinirole Hcl     REACTION: severe restless legs and insomnia  . Sulfamethoxazole     REACTION: nausea, gas, loose stools  .  Tramadol     REACTION: dizziness, blurred vision, nausea, HA, insomnia.     Current Outpatient Prescriptions  Medication Sig Dispense Refill  . ascorbic acid (SUPER C COMPLEX) 500 MG tablet Take 1 tablet (500 mg total) by mouth daily.      Marland Kitchen aspirin 81 MG tablet Take 81 mg by mouth daily.        . Biotin 5000 MCG TABS Take 1 tablet by mouth daily.        . Calcium-Magnesium 500-250 MG TABS Take 2 tablets by mouth daily.      . carvedilol (COREG) 25 MG tablet Take 1 tablet (25 mg total) by mouth 2 (two) times daily with a meal.  180 tablet  0  . Cholecalciferol (VITAMIN D3) 2000 UNITS TABS Take 1 tablet by mouth daily.      . cholestyramine (QUESTRAN) 4 GM/DOSE powder 2 scoops per day       . Evening Primrose Oil 500 MG CAPS 1 tablet twice a day      . fish oil-omega-3 fatty acids 1000 MG capsule 3 capsules daily      . fluticasone (FLONASE) 50 MCG/ACT nasal spray 2 sprays by Nasal route daily.        . Glycerin-Polysorbate 80 (REFRESH DRY EYE THERAPY) 1-1 % SOLN Apply 1 drop to eye 4 (four) times daily.      Marland Kitchen guaifenesin (HUMIBID E) 400 MG TABS 1 TABLET BY MOUTH TWO TIMES DAILY      .  levothyroxine (SYNTHROID, LEVOTHROID) 88 MCG tablet Take 1 tablet (88 mcg total) by mouth daily.  90 tablet  3  . lidocaine (LIDODERM) 5 % Apply as directed  30 patch  0  . LIPITOR 40 MG tablet TAKE 1 TABLET BY MOUTH AT BEDTIME  30 tablet  2  . loperamide (IMODIUM A-D) 2 MG tablet As needed      . meclizine (ANTIVERT) 25 MG tablet Take 1 tablet every 6 hours as needed       . mesalamine (LIALDA) 1.2 G EC tablet Take 3 tablets by mouth once a day as directed by Dr. Amedeo Plenty       . Multiple Vitamin (MULTIVITAMIN PO) Once a day       . pramipexole (MIRAPEX) 0.5 MG tablet Take 1 tablet (0.5 mg total) by mouth 2 (two) times daily. 1  Tablet  by mouth once two times daily  180 tablet  3  . PROBIOTIC CAPS Take 1 capsule by mouth daily.      . Simethicone (PHAZYME) 180 MG CAPS 3 times a day       . traMADol (ULTRAM)  50 MG tablet TAKE 1 TABLET BY MOUTH EVERY 8 HOURS AS NEEDED FOR PAIN  270 tablet  0  . DISCONTD: levothyroxine (SYNTHROID, LEVOTHROID) 100 MCG tablet TAKE 1 TABLET DAILY  90 tablet  2    Past Medical History  Diagnosis Date  . Merkel cell tumor   . OSA (obstructive sleep apnea)   . History of bronchitis   . Other diseases of lung, not elsewhere classified   . Hypertension   . CAD (coronary artery disease)   . History of atrial flutter   . Cerebrovascular disease   . Hyperlipemia   . Obesity   . GERD (gastroesophageal reflux disease)   . Diverticulosis of colon   . IBS (irritable bowel syndrome)   . Colonic polyp   . Crohn's disease   . Hypothyroidism   . History of hyperparathyroidism   . Fibromyalgia   . Osteopenia   . History of headache   . Syncope   . Restless legs syndrome (RLS)   . Psychosis   . History of tuberculosis     Past Surgical History  Procedure Date  . Parathyroid adenoma surgery   . Cholecystectomy   . Moh';s surg left lower eye lid surgery and lid reconstruction surgery 2/10    ROS:  As stated in the HPI and negative for all other systems.  PHYSICAL EXAM BP 153/66  Pulse 71  Ht 5' 2"  (1.575 m)  Wt 178 lb 12.8 oz (81.103 kg)  BMI 32.70 kg/m2 GENERAL:  Well appearing HEENT:  Pupils equal round and reactive, fundi not visualized, oral mucosa unremarkable NECK:  No jugular venous distention, waveform within normal limits, carotid upstroke brisk and symmetric, no bruits, no thyromegaly LYMPHATICS:  No cervical, inguinal adenopathy LUNGS:  Clear to auscultation bilaterally BACK:  No CVA tenderness CHEST:  Unremarkable HEART:  PMI not displaced or sustained,S1 and S2 within normal limits, no S3, no S4, no clicks, no rubs, no murmurs ABD:  Flat, positive bowel sounds normal in frequency in pitch, no bruits, no rebound, no guarding, no midline pulsatile mass, no hepatomegaly, no splenomegaly EXT:  2 plus pulses throughout, no edema, no cyanosis no  clubbing SKIN:  No rashes no nodules NEURO:  Cranial nerves II through XII grossly intact, motor grossly intact throughout PSYCH:  Cognitively intact, oriented to person place and time   EKG:  Sinus rhythm, rate 66, axis within normal limits, intervals within normal limits, no acute ST-T wave changes.   ASSESSMENT AND PLAN  Atrial flutter - The patient has had no recurrence of this. No change in therapy is indicated. No further evaluation is planned.  Hypertension - Her blood pressure is well controlled. She will continue the meds as listed.  Dyslipidemia - This is mild and she has had an excellent lipid profile earlier this year. No change in therapy is indicated.  Obesity - We did discuss exercise. There is a stationary bicycle at home that she could use.  CAD - This was nonobstructive and I would not suspect any change in his. No further evaluation is warranted.

## 2011-11-01 NOTE — Patient Instructions (Addendum)
The current medical regimen is effective;  continue present plan and medications.  Follow up as needed 

## 2011-11-05 ENCOUNTER — Other Ambulatory Visit: Payer: Self-pay | Admitting: Pulmonary Disease

## 2011-11-06 DIAGNOSIS — M79609 Pain in unspecified limb: Secondary | ICD-10-CM | POA: Diagnosis not present

## 2011-11-13 ENCOUNTER — Telehealth: Payer: Self-pay | Admitting: Pulmonary Disease

## 2011-11-13 DIAGNOSIS — M76899 Other specified enthesopathies of unspecified lower limb, excluding foot: Secondary | ICD-10-CM | POA: Diagnosis not present

## 2011-11-13 DIAGNOSIS — M461 Sacroiliitis, not elsewhere classified: Secondary | ICD-10-CM | POA: Diagnosis not present

## 2011-11-14 NOTE — Telephone Encounter (Signed)
Form has been completed and will fax to the pharmacy per pts request.

## 2011-12-04 ENCOUNTER — Other Ambulatory Visit: Payer: Self-pay | Admitting: Internal Medicine

## 2011-12-04 DIAGNOSIS — H04129 Dry eye syndrome of unspecified lacrimal gland: Secondary | ICD-10-CM | POA: Diagnosis not present

## 2011-12-11 ENCOUNTER — Ambulatory Visit (INDEPENDENT_AMBULATORY_CARE_PROVIDER_SITE_OTHER)
Admission: RE | Admit: 2011-12-11 | Discharge: 2011-12-11 | Disposition: A | Discharge status: Home / Self Care | Payer: Medicare Other | Source: Ambulatory Visit | Attending: Pulmonary Disease | Admitting: Pulmonary Disease

## 2011-12-11 ENCOUNTER — Encounter: Payer: Self-pay | Admitting: Pulmonary Disease

## 2011-12-11 ENCOUNTER — Encounter: Payer: Self-pay | Admitting: *Deleted

## 2011-12-11 ENCOUNTER — Ambulatory Visit (INDEPENDENT_AMBULATORY_CARE_PROVIDER_SITE_OTHER): Payer: Medicare Other | Admitting: Pulmonary Disease

## 2011-12-11 VITALS — BP 124/60 | HR 64 | Temp 98.1°F | Ht 62.0 in | Wt 178.8 lb

## 2011-12-11 DIAGNOSIS — E785 Hyperlipidemia, unspecified: Secondary | ICD-10-CM

## 2011-12-11 DIAGNOSIS — I2510 Atherosclerotic heart disease of native coronary artery without angina pectoris: Secondary | ICD-10-CM | POA: Diagnosis not present

## 2011-12-11 DIAGNOSIS — D126 Benign neoplasm of colon, unspecified: Secondary | ICD-10-CM

## 2011-12-11 DIAGNOSIS — K219 Gastro-esophageal reflux disease without esophagitis: Secondary | ICD-10-CM

## 2011-12-11 DIAGNOSIS — E039 Hypothyroidism, unspecified: Secondary | ICD-10-CM

## 2011-12-11 DIAGNOSIS — I10 Essential (primary) hypertension: Secondary | ICD-10-CM

## 2011-12-11 DIAGNOSIS — J40 Bronchitis, not specified as acute or chronic: Secondary | ICD-10-CM | POA: Diagnosis not present

## 2011-12-11 DIAGNOSIS — J42 Unspecified chronic bronchitis: Secondary | ICD-10-CM | POA: Diagnosis not present

## 2011-12-11 DIAGNOSIS — I679 Cerebrovascular disease, unspecified: Secondary | ICD-10-CM

## 2011-12-11 DIAGNOSIS — E669 Obesity, unspecified: Secondary | ICD-10-CM

## 2011-12-11 DIAGNOSIS — K589 Irritable bowel syndrome without diarrhea: Secondary | ICD-10-CM

## 2011-12-11 DIAGNOSIS — K5090 Crohn's disease, unspecified, without complications: Secondary | ICD-10-CM

## 2011-12-11 DIAGNOSIS — I4892 Unspecified atrial flutter: Secondary | ICD-10-CM

## 2011-12-11 DIAGNOSIS — G2581 Restless legs syndrome: Secondary | ICD-10-CM

## 2011-12-11 DIAGNOSIS — K5730 Diverticulosis of large intestine without perforation or abscess without bleeding: Secondary | ICD-10-CM

## 2011-12-11 DIAGNOSIS — IMO0001 Reserved for inherently not codable concepts without codable children: Secondary | ICD-10-CM

## 2011-12-11 DIAGNOSIS — M899 Disorder of bone, unspecified: Secondary | ICD-10-CM

## 2011-12-11 MED ORDER — MESALAMINE 1.2 G PO TBEC: DELAYED_RELEASE_TABLET | ORAL | Status: DC

## 2011-12-11 MED ORDER — CLOTRIMAZOLE-BETAMETHASONE 1-0.05 % EX CREA: TOPICAL_CREAM | Freq: Two times a day (BID) | CUTANEOUS | Status: DC

## 2011-12-11 NOTE — Progress Notes (Signed)
Subjective:    Patient ID: [name], female    DOB: Jun 26, 1932, 78 y.o.   MRN: [id number]  HPI 78 y/o WF here for a follow up visit... she has mult med problems and meds as noted below >> See prev notes for earlier entries...  ~  Jun 06, 2010:  32mo ROV & she is quite excited over her 38 y/o daughter's new baby recently... 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  She informs me that she has had follow up appt w/ DrBensimhon, DrHayes, & DrPlovsky and that all 3 physicians have "released" her... She is c/o a stuffy nose & rec to use Saline nasal mist; she requests Lidoderm patches; she is due for a routine follow up CXR (clear, NAD) & fasting blood work (all wnl x incr TSH & we will incr dose from 88==>100) but she never did incr the med & stayed on 31mcg/d.  ~  December 07, 2010:  32mo ROV & she had recent antibiotic Rx for sinusitis- now improved w/ clear drainage only; otherwise stable & f/u TSH today, still on 26mcg/d dose Synthroid is wnl at 2.63...    She saw DrJHayes for GI 9/12> hx Crohn's colitis, loose stools, gas, etc; treated w/ Lialda 3.6gm/d along w/ Questran daily; stable- continue same + probiotic...    She also had continuing f/u at [facility] ophthalmology DrFowler> note reviewed...  ~  Jun 07, 2011:  32mo ROV & [name] is c/o some LBP- eval by Susann Givens 2/13 & "it's mostly arthritis" she has received shots from [facility] w/ some relief;  She stays active caring for her 110mo ago grand daughter; she's been under incr stress w/ husb undergoing AVR & CABG by DrOwens (doing well now)...    She continues to be followed by DrShockley & DrFowler at [facility] Ophthalmology Dept & DrMurray here (XRT) for her Merkel cell tumor left lower lid w/ mult surgeries (Mohs, the reconstruction), but she notes lots of tearing, photosens, etc...     We reviewed prob list, meds, xrays and labs> see below>> BMD here 12/12 showed TScores+0.8  in spine (osteophytes & scoliosis), and -2.1 in left [facility] Lumbar spine films 2/13 showed mild scoliosis & degen changes, vasc calcif & GB surg clips... Nuclear Bone Scan by Susann Givens 2/13 showed diffuse degenerative uptake, no fracture or bone lesions... LABS 6/13:  FLP- at goals on Lip40;  Chems- wnl;  CBC- wnl;  TSH=3.10;  VitD=47;  Sed=23   ~  December 11, 2011:  32mo ROV & [name] has had a good interval- her CC= Insomnia but she doesn't want additional medication... 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    ENT> Hx MRSA infection & abscess drained by DrBates; she continues on Rx w/ Netti pot, Flonase, Mucinex, Meclizine...    Hx recurrent bronchitis & Abn CXR> 2cm left apical fluid density- ?nerve sheath tumor on prev CXRs (subtle medial left apical abn) and CTscans (easily seen & no change serially);  CXR today shows norm heart size, clear lungs, no change in subtle medial left apical extrapleural lesion...    HBP> on Coreg25Bid; BP= 124/60 & she denies CP, palpit, ch in SOB, edema, etc..Marland Kitchen  CAD> on ASA81; Hx non-obstructive dis w/ cath 2006 showing 30%LAD lesion & good LVF; followed by DrBensimhon/ Hochrein for Cards...    Hx AFlutter> s/p catheter ablation in 2005 by DrKlein and no known recurrence...    Cerebrovasc dis> on ASA81; known 80% left MCA stenosis & she denies cerebral ischemic symptoms; followed by DrWillis...    Hyperlipid> on Lip40; last FLP 6/13 showed TChol 142, TG 93, HDL 79, LDL 45    Hypothyroid, Hyperpara> on Synthroid88; followed by DrBalan (we do not have notes from her); last TSH=3.10 Jun'13; last Ca=9.4 Jun'13...    Obesity> wt= 179# which is down 3# over the last 83mo; we reviewed diet, exercise, & wt reduction  strategies...    GI- GERD, Divertics, IBS, Polyps, Crohn's> on Lialda, Questran, Immodium, Probiotic, Simethacone; managed by [facility] for GI- she denies abd discomfort, dysphagia, n/v, c/d, blood seen...    Rheum- FM> on Tramadol prn; followed by DrDeveshwar...    Osteopenia> on calcium, MVI, VitD2000; last VitD level = 47 Jun'13    HAs, RLS> on Mirapex0.5mg /d; followed by DrDohmeier...    Hx Psychosis> followed by DrPlovsky, not currently on medication... We reviewed prob list, meds, xrays and labs> see below for updates >> she had the 2013 flu vaccine in Aug'13; she brought a 4page med hx summary report... CXR 12/13 is clear- normal heart size, clear lungs- no change in subtle medial left apical lesion better delineated on old CTscans...           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 ~  She continues periodic follow up by DrShockley [facility] DrFowler [facility]), DrMurray ([facility])... 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 she uses Claritin & Flonase for sinuses.  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  ~  CT Chest 3/10 (at [facility]) showed atherosclerotic dis in Ao & coronaries, calcif AoV, left lung apex extra-pleural mass ~2cm, 4mm LUL nodule, scat ground-glass opac, atelec, marked biliary ductal dil- consider MRCP... (Old films and CT Chest here 6/07 & 10/09 compared- not mentioned on 2007 scan but the 2009 scan noted lesion left apex 2.6 x 2.0cm <sl larger than 2007 scan in retrospect> ?neurogenic/ nerve sheath tumor). ~  Lesion not mentioned  on routine CXRs> CXR 5/12 norm heart, clear lungs, NAD...  OBSTRUCTIVE SLEEP APNEA  - Hx mild OSA on Sleep Study by DrDohmeier... she never really used the CPAP and states that "now I'm fine off of it"... she does have some insomnia & uses BENEDRYL Prn.  HYPERTENSION (ICD-401.9) - controlled on COREG 25mg Bid...  ~  11/12:  BP=122/60 and pressures are good at home etc;  denies HA, visual changes, CP, palipit, dizziness, dyspnea, edema, etc; she has chronic fatigue symptoms. ~  5/13:  BP= 124/68 & she remains largely asymptomatic...  CORONARY ARTERY DISEASE (ICD-414.00) - on COREG 25mg Bid, & supposed to be on ASA 81mg /d... non-obstructive disease w/ cath 9/06 showing 30% LAD lesion, good LVF... neg Myoview 3/06 no ischemia, EF=73%... she was prev followed by DrBensimhon for Cards. ~  10/13: she saw DrHochrein- HBP, CAD, risk factors, hx  AFlutter- s/p ablation; EKG showed NSR, rate66, wnl, NAD; doing well w/o CV complaints, not exercising & advised to incr exercise program...  Hx of ATRIAL FLUTTER (ICD-427.32) - S/P catheter ablation in 2005 by DrKlein.  CEREBROVASCULAR DISEASE (ICD-437.9) - eval by DrWillis and DrBritz (Neurosurg @ [facility]) confirms 80% left middle cerebral art stenosis which is felt to be asymptomatic... they advise ASA 81mg /d, and secondary risk factor modification...  HYPERLIPIDEMIA (ICD-272.4) - now on LIPITOR 40mg /d + FISH OIL Bid...  ~  FLP 10/08 on Vytor10/80 showed TChol 130, TG 139, HDL 38, LDL 64... ~  FLP 4/09 on Vytor10/80 showed TChol 138, TG 102, HDL 62, LDL 56... ~  Vytorin & Fish Oil stopped 11/09 in [place]. ~  Jan10: DrBensimhon started Lipitor 40/d. ~  FLP 6/10 on Lip40 showed TChol 148, TG 94, HDL 67, LDL 62 ~  FLP 6/11 on Lip40 showed TChol 186, TG 76, HDL 82, LDL 88 ~  FLP 5/12 on Lip40 showed TChol 156, TG 110, HDL 81, LDL 53 ~  FLP 6/13 on Lip40 showed TChol 142, TG 93, HDL 79, LDL 45  OBESITY (ICD-278.00) - she notes that her fibromyalgia makes  it difficult for her to exercise. ~  weight 8/09 = 221# ~  weight 12/09 = 197# ~  weight 4/10 = 169#... great job w/ diet program. ~  weight 6/11 = 173# ~  weight 11/11 = 190#... we discussed diet + exercise. ~  Weight 5/12 = 189# ~  Weight 5/13 = 182#       << GI - Prev MANAGED BY DrJHAYES ("He released me")>> GASTROESOPHAGEAL REFLUX DISEASE (ICD-530.81) - followed by DrJHayes for GI on NEXIUM 40Bid... she has GERD, Hx gastric ulcer. DIVERTICULOSIS OF COLON (ICD-562.10) - hx diverticulitis in past... She takes PROBIOTIC & SIMETHACONE... IRRITABLE BOWEL SYNDROME (ICD-564.1) - DrHayes' eval w/ colon reported to show "ulcers" c/w Crohn's dis... COLONIC POLYPS - Hx of divertics w/ diverticulitis in the past and IBS, and hx of Colon Polyps w/ prev colonoscopy in 4/06... see 10/09 hosp by [facility] and f/u colonoscopy w/ ?Crohn's... CROHN'S DISEASE (ICD-555.9) - on LIALDA 1.2gm- 3 tabs daily, QUESTRAN 2 scoops daily, & IMMODIUM Prn...       << ENDOCRINE - Prev MANAGED BY DrBALAN >> HYPERPARATHYROIDISM, HX OF (ICD-V12.2) - she had hypercalcemia w/ right inferior parathyroid gland resected 11/07 by DrGerkin... HYPOTHYROIDISM (ICD-244.9) - on SYNTHROID daily... prev followed by DrBalan. ~  labs 6/07 showed TSH= 5.16... managed by DrBalan at that time. ~  labs here 7/10 on Levothy112 showed TSH= 0.16... rec> decr to 1/2 tab & f/u lab. ~  labs here 10/10 showed TSH= 10.04... rec> change to 22mcg/d. ~  labs here 6/11 on Levothy75 showed TSH= 6.84... rec> step up to 68mcg/d. ~  Labs 5/12 on Levothy88 showed TSH= 13.14... rec to incr to 14mcg/d (she never did)... ~  Labs 11/12 on Levo88 showed TSH= 2.63... Continue same. ~  Labs 5/13 on Levo88 showed TSH= 3.10       << RHEUM - MAMAGED BY DrDEVESHWAR >> FIBROMYALGIA (ICD-729.1) - followed by DrDeveshwar... she uses TRAMADOL & LIDODERM patches for pain. OSTEOPENIA (ICD-733.90) - on FOSAMAX 70mg /wk, ca++, vits... prev followed by DrBalan... ~   last BMD 9/08 showed TScores -0.8 in Spine & -2.3 in right FemNeck... ~  f/u BMD 10/10 showed TScores invalid in Spine, & -2.2 in right FemNeck... continue Rx. ~  BMD here 12/12 showed TScores+0.8 in spine (osteophytes & scoliosis), and -2.1 in  left FemNeck... Continue same. ~  Nuclear Bone Scan 2/13 showed areas of degenerative uptake in shoulders, sternoclavic joints, lower lum spine, bilat hips & knees & feet... ~  2/13: XRay Lumbar spine showed mild scoliosis, mod degen changes at L3-4 & L4-5, vasc calcif also seen...  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  SYNCOPE (ICD-780.2) - after a trip to visit family in [place] she was flying home to [place] and passed out on the plane... she was taken to a hosp outside of [place] and adm for several days... ~ syncopal spell "altered level of consciusness" she remembers being on the plane and next in the ER...  their DC note indicates poss seizure but nothing to substantiate this (no seiz activ noted by anyone)...  ~  MRI Brain was neg x  max sinusitis... MRA however showed 80% stenosis in the left MCA... ~  CDoppler showed mild plaque, norm waveforms, no signif stenoses... ~  2DEcho showed basically WNL, borderline conc LVH, EF=55-60%...  RESTLESS LEG SYNDROME (ICD-333.94) - she's been on MIRAPEX 0.5mg  Qd... ~  10/10: c/o incr symptoms and instructed to incr Mirapex to 2 tabs...  PSYCHOSIS (ICD-298.9) - this was attributed to Prednisone therapy for the Crohn's dis... hosp in [place] and mult meds discontinued and changed by Psychiatrists... now followed by [facility] and he is off the prev Depakote & Keppra... & she is also off the prev Zoloft & Restoril...   Past Surgical History  Procedure Date  . Parathyroid adenoma surgery   . Cholecystectomy   . Moh';s surg left lower eye lid surgery and lid reconstruction surgery 2/10    Outpatient Encounter Prescriptions as of 12/11/2011   Medication Sig Dispense Refill  . ascorbic acid (SUPER C COMPLEX) 500 MG tablet Take 1 tablet (500 mg total) by mouth daily.      Marland Kitchen aspirin 81 MG tablet Take 81 mg by mouth daily.        . Biotin 5000 MCG TABS Take 1 tablet by mouth daily.        . Calcium-Magnesium 500-250 MG TABS Take 2 tablets by mouth daily.      . carvedilol (COREG) 25 MG tablet Take 1 tablet (25 mg total) by mouth 2 (two) times daily with a meal.  180 tablet  1  . Cholecalciferol (VITAMIN D3) 2000 UNITS TABS Take 1 tablet by mouth daily.      . cholestyramine (QUESTRAN) 4 GM/DOSE powder 2 scoops per day       . Evening Primrose Oil 500 MG CAPS 1 tablet twice a day      . fish oil-omega-3 fatty acids 1000 MG capsule 3 capsules daily      . fluticasone (FLONASE) 50 MCG/ACT nasal spray 2 sprays by Nasal route daily.        . Glycerin-Polysorbate 80 (REFRESH DRY EYE THERAPY) 1-1 % SOLN Apply 1 drop to eye 4 (four) times daily.      Marland Kitchen guaifenesin (HUMIBID E) 400 MG TABS 1 TABLET BY MOUTH TWO TIMES DAILY      . levothyroxine (SYNTHROID, LEVOTHROID) 88 MCG tablet Take 1 tablet (88 mcg total) by mouth daily.  90 tablet  3  . lidocaine (LIDODERM) 5 % Apply as directed  30 patch  0  . LIPITOR 40 MG tablet TAKE 1 TABLET BY MOUTH AT BEDTIME  30 tablet  2  . loperamide (IMODIUM A-D) 2 MG tablet As needed      .  mesalamine (LIALDA) 1.2 G EC tablet Take 3 tablets by mouth once a day as directed by Dr. Madilyn Fireman       . Multiple Vitamin (MULTIVITAMIN PO) Once a day       . pramipexole (MIRAPEX) 0.5 MG tablet TAKE 1 TABLET BY MOUTH TWICE DAILY  180 tablet  0  . PROBIOTIC CAPS Take 1 capsule by mouth daily.      . Simethicone (PHAZYME) 180 MG CAPS 3 times a day       . traMADol (ULTRAM) 50 MG tablet TAKE 1 TABLET BY MOUTH EVERY 8 HOURS AS NEEDED FOR PAIN  270 tablet  0  . meclizine (ANTIVERT) 25 MG tablet Take 1 tablet every 6 hours as needed         Allergies  Allergen Reactions  . Bacitracin     REACTION: redness, rash, severe  itching  . Carbamazepine     Intolerant to Tegretol.  REACTION: dizziness, blurred vision, nausea, HA, insomnia  . Codeine     REACTION: nause and vomiting  . Diclofenac Sodium     REACTION: severe headache  . Fentanyl     REACTION: chest pain, HBP, with second patch experienced vertigo and nausea  . Fluoxetine Hcl     REACTION: dizziness, blurred vision, nausea, HA, insomnia  . Hydromorphone Hcl     REACTION: intolerant to dilaudid with nausea, vomiting  . Indomethacin     REACTION: dizziness, blurred vision, nausea, HA, insomnia  . Lisinopril     REACTION: cough  . Meperidine Hcl     REACTION: intolerant to demerol with nausea, vomiting  . Morphine     REACTION: n/v  . Prednisone     REACTION: pt had psychiatric episode w/ hosp - lost memory x3weeks  . Pregabalin     REACTION: severe restless legs, insomnia  . Propranolol Hcl     Intolerant to Inderal.  REACTION: dizziness, blurred vision, nausea, HA, insomnia  . Ropinirole Hcl     REACTION: severe restless legs and insomnia  . Sulfamethoxazole     REACTION: nausea, gas, loose stools  . Tramadol     REACTION: dizziness, blurred vision, nausea, HA, insomnia.     Current Medications, Allergies, Past Medical History, Past Surgical History, Family History, and Social History were reviewed in [employer] record.     Review of Systems        See HPI - all other systems neg except as noted... The patient complains of weight gain, dyspnea on exertion, and muscle weakness.  The patient denies anorexia, fever, weight loss, vision loss, decreased hearing, hoarseness, chest pain, syncope, peripheral edema, prolonged cough, headaches, hemoptysis, abdominal pain, melena, hematochezia, severe indigestion/heartburn, hematuria, incontinence, suspicious skin lesions, transient blindness, difficulty walking, depression, unusual weight change, abnormal bleeding, enlarged lymph nodes, angioedema, and breast masses.     Objective:   Physical Exam     WD, Overweight, 78 y/o WF in NAD...  GENERAL:  Alert & oriented; pleasant & cooperative... HEENT:  [place]/AT, EOM-wnl, EACs-clear, TMs-wnl, NOSE-clear, THROAT-clear & wnl. ** s/p mult surg left lower lid- sl red, improved... NECK:  Supple w/ fairROM; no JVD; normal carotid impulses w/o bruits; no thyromegaly or nodules palpated; no lymphadenopathy. CHEST:  Clear to P & A; without wheezes/ rales/ or rhonchi. HEART:  Regular Rhythm; gr 1/6 SEM without rubs/ or gallops. ABDOMEN:  Obese soft & nontender; normal bowel sounds; no organomegaly or masses detected. EXT: without deformities, mild arthritic changes; no varicose  veins/ +venous insuffic/ no edema... NEURO:  CN's intact; no focal neuro deficits... DERM:  No lesions noted; no rash etc...  RADIOLOGY DATA:  Reviewed in the EPIC EMR & discussed w/ the patient...  LABORATORY DATA:  Reviewed in the EPIC EMR & discussed w/ the patient...   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  HBP>  Controlled on the Coreg but needs better low sodium wt reducing diet...  Cardiac> CAD, Hx AFlutter s/p ablation>  Stable on ASA + the Coreg, continue same...  HYPERLIPID>  Stable on the Lip40...  Obesity> she has not been successful losing the wt, we reviewed wt reducing diet + exercise...  GI>  Followed by [facility] as noted for her Crohn's etc & she has been doing well, "he released me" she says...  HYPOTHYROID>  TSH is elev at 13 on the Synthroid88 (we will confirm taking it daily) & needs incr dose to 174mcg/d...  Rheum>  FM/ osteopenia>  Followed by Rober Minion for Rheum, continue her regimen...   Patient's Medications  New Prescriptions   CLOTRIMAZOLE-BETAMETHASONE (LOTRISONE) CREAM    Apply topically 2 (two) times daily. As directed  Previous Medications   ASCORBIC ACID (SUPER C COMPLEX) 500 MG TABLET     Take 1 tablet (500 mg total) by mouth daily.   ASPIRIN 81 MG TABLET    Take 81 mg by mouth daily.     BIOTIN 5000 MCG TABS    Take 1 tablet by mouth daily.     CALCIUM-MAGNESIUM 500-250 MG TABS    Take 2 tablets by mouth daily.   CARVEDILOL (COREG) 25 MG TABLET    Take 1 tablet (25 mg total) by mouth 2 (two) times daily with a meal.   CHOLECALCIFEROL (VITAMIN D3) 2000 UNITS TABS    Take 1 tablet by mouth daily.   CHOLESTYRAMINE (QUESTRAN) 4 GM/DOSE POWDER    2 scoops per day    EVENING PRIMROSE OIL 500 MG CAPS    1 tablet twice a day   FISH OIL-OMEGA-3 FATTY ACIDS 1000 MG CAPSULE    3 capsules daily   FLUTICASONE (FLONASE) 50 MCG/ACT NASAL SPRAY    2 sprays by Nasal route daily.     GLYCERIN-POLYSORBATE 80 (REFRESH DRY EYE THERAPY) 1-1 % SOLN    Apply 1 drop to eye 4 (four) times daily.   GUAIFENESIN (HUMIBID E) 400 MG TABS    1 TABLET BY MOUTH TWO TIMES DAILY   LEVOTHYROXINE (SYNTHROID, LEVOTHROID) 88 MCG TABLET    Take 1 tablet (88 mcg total) by mouth daily.   LIDOCAINE (LIDODERM) 5 %    Apply as directed   LIPITOR 40 MG TABLET    TAKE 1 TABLET BY MOUTH AT BEDTIME   LOPERAMIDE (IMODIUM A-D) 2 MG TABLET    As needed   MECLIZINE (ANTIVERT) 25 MG TABLET    Take 1 tablet every 6 hours as needed    MULTIPLE VITAMIN (MULTIVITAMIN PO)    Once a day    PRAMIPEXOLE (MIRAPEX) 0.5 MG TABLET    TAKE 1 TABLET BY MOUTH TWICE DAILY   PROBIOTIC CAPS    Take 1 capsule by mouth daily.   SIMETHICONE (PHAZYME) 180 MG CAPS    3 times a day    TRAMADOL (ULTRAM) 50 MG TABLET    TAKE 1 TABLET BY MOUTH EVERY 8 HOURS AS NEEDED FOR PAIN  Modified Medications   Modified Medication Previous Medication   MESALAMINE (LIALDA) 1.2 G EC TABLET mesalamine (LIALDA) 1.2 G EC tablet      Take 3 tablets by mouth once a day as directed by Dr. Madilyn Fireman    Take 3 tablets by mouth once a day as directed by Dr. Madilyn Fireman   Discontinued Medications   No medications on file

## 2011-12-11 NOTE — Patient Instructions (Addendum)
Today we updated your med list in our EPIC system...    Continue your current medications the same...  We refilled your Lialda per request...  We also wrote a new prescription for LOTRISONE Cream to try on your rash as we discussed...  Today we did a follow up CXR too & we will call you w/ the results when avail...  Call for any questions...  Let's plan a follow up visit w/ FASTING blood work in 6 months.Marland KitchenMarland Kitchen

## 2011-12-12 DIAGNOSIS — M76899 Other specified enthesopathies of unspecified lower limb, excluding foot: Secondary | ICD-10-CM | POA: Diagnosis not present

## 2012-01-15 ENCOUNTER — Ambulatory Visit (INDEPENDENT_AMBULATORY_CARE_PROVIDER_SITE_OTHER): Payer: Medicare Other | Admitting: Pulmonary Disease

## 2012-01-15 ENCOUNTER — Encounter: Payer: Self-pay | Admitting: Pulmonary Disease

## 2012-01-15 VITALS — BP 124/70 | HR 66 | Temp 98.3°F | Ht 62.0 in | Wt 177.6 lb

## 2012-01-15 DIAGNOSIS — R413 Other amnesia: Secondary | ICD-10-CM | POA: Diagnosis not present

## 2012-01-15 DIAGNOSIS — I10 Essential (primary) hypertension: Secondary | ICD-10-CM

## 2012-01-15 DIAGNOSIS — F29 Unspecified psychosis not due to a substance or known physiological condition: Secondary | ICD-10-CM

## 2012-01-15 DIAGNOSIS — R51 Headache: Secondary | ICD-10-CM

## 2012-01-15 DIAGNOSIS — I679 Cerebrovascular disease, unspecified: Secondary | ICD-10-CM | POA: Diagnosis not present

## 2012-01-15 DIAGNOSIS — IMO0001 Reserved for inherently not codable concepts without codable children: Secondary | ICD-10-CM

## 2012-01-15 DIAGNOSIS — E039 Hypothyroidism, unspecified: Secondary | ICD-10-CM

## 2012-01-15 DIAGNOSIS — I2510 Atherosclerotic heart disease of native coronary artery without angina pectoris: Secondary | ICD-10-CM | POA: Diagnosis not present

## 2012-01-15 DIAGNOSIS — E669 Obesity, unspecified: Secondary | ICD-10-CM

## 2012-01-15 DIAGNOSIS — E785 Hyperlipidemia, unspecified: Secondary | ICD-10-CM

## 2012-01-15 MED ORDER — PRAMIPEXOLE DIHYDROCHLORIDE 0.5 MG PO TABS: 0.5000 mg | ORAL_TABLET | Freq: Two times a day (BID) | ORAL | Status: DC

## 2012-01-15 MED ORDER — TRAMADOL HCL 50 MG PO TABS: 50.0000 mg | ORAL_TABLET | Freq: Three times a day (TID) | ORAL | Status: DC | PRN

## 2012-01-15 NOTE — Progress Notes (Addendum)
Subjective:    Patient ID: [name], female    DOB: 1932-08-14, 79 y.o.   MRN: [id number]  HPI 79 y/o WF here for a follow up visit... she has mult med problems and meds as noted below >> See prev notes for earlier entries...  ~  Jun 06, 2010:  69mo ROV & she is quite excited over her 48 y/o daughter's new baby recently... 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  She informs me that she has had follow up appt w/ DrBensimhon, DrHayes, & DrPlovsky and that all 3 physicians have "released" her... She is c/o a stuffy nose & rec to use Saline nasal mist; she requests Lidoderm patches; she is due for a routine follow up CXR (clear, NAD) & fasting blood work (all wnl x incr TSH & we will incr dose from 88==>100) but she never did incr the med & stayed on 5mcg/d.  ~  December 07, 2010:  69mo ROV & she had recent antibiotic Rx for sinusitis- now improved w/ clear drainage only; otherwise stable & f/u TSH today, still on 42mcg/d dose Synthroid is wnl at 2.63...    She saw DrJHayes for GI 9/12> hx Crohn's colitis, loose stools, gas, etc; treated w/ Lialda 3.6gm/d along w/ Questran daily; stable- continue same + probiotic...    She also had continuing f/u at [facility] ophthalmology DrFowler> note reviewed...  ~  Jun 07, 2011:  69mo ROV & [name] is c/o some LBP- eval by Susann Givens 2/13 & "it's mostly arthritis" she has received shots from [facility] w/ some relief;  She stays active caring for her 85mo ago grand daughter; she's been under incr stress w/ husb undergoing AVR & CABG by DrOwens (doing well now)...    She continues to be followed by DrShockley & DrFowler at [facility] & DrMurray here (XRT) for her Merkel cell tumor left lower lid w/ mult surgeries (Mohs, the reconstruction), but she notes lots of tearing, photosens, etc...     We reviewed prob list, meds, xrays and labs> see below>> BMD here 12/12 showed TScores+0.8  in spine (osteophytes & scoliosis), and -2.1 in left [facility] Lumbar spine films 2/13 showed mild scoliosis & degen changes, vasc calcif & GB surg clips... Nuclear Bone Scan by Susann Givens 2/13 showed diffuse degenerative uptake, no fracture or bone lesions... LABS 6/13:  FLP- at goals on Lip40;  Chems- wnl;  CBC- wnl;  TSH=3.10;  VitD=47;  Sed=23   ~  December 11, 2011:  69mo ROV & [name] has had a good interval- her CC= Insomnia but she doesn't want additional medication... 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    ENT> Hx MRSA infection & abscess drained by DrBates; she continues on Rx w/ Netti pot, Flonase, Mucinex, Meclizine...    Hx recurrent bronchitis & Abn CXR> 2cm left apical fluid density- ?nerve sheath tumor on prev CXRs (subtle medial left apical abn) and CTscans (easily seen & no change serially);  CXR today shows norm heart size, clear lungs, no change in subtle medial left apical extrapleural lesion...    HBP> on Coreg25Bid; BP= 124/60 & she denies CP, palpit, ch in SOB, edema, etc..Marland Kitchen  CAD> on ASA81; Hx non-obstructive dis w/ cath 2006 showing 30%LAD lesion & good LVF; followed by DrBensimhon/ Hochrein for Cards...    Hx AFlutter> s/p catheter ablation in 2005 by DrKlein and no known recurrence...    Cerebrovasc dis> on ASA81; known 80% left MCA stenosis (seen last on CTA Head 5/09) & she denies cerebral ischemic symptoms; followed by DrWillis...    Hyperlipid> on Lip40; last FLP 6/13 showed TChol 142, TG 93, HDL 79, LDL 45    Hypothyroid, Hx of Hyperpara> on Synthroid88; followed by DrBalan (we do not have notes from her); last TSH=3.10 Jun'13; last Ca=9.4 Jun'13- she had right inferior parathyroid gland resected 11/07 by DrGerkin...     Obesity> wt= 179# which is down 3# over the last 26mo; we reviewed diet, exercise, & wt reduction strategies...    GI- GERD, Divertics, IBS, Polyps, Crohn's> on Lialda, Questran, Immodium, Probiotic, Simethacone; managed by [facility] for GI- she denies abd discomfort, dysphagia, n/v, c/d, blood seen...    Rheum- FM> on Tramadol prn; followed by DrDeveshwar...    Osteopenia> on calcium, MVI, VitD2000; last VitD level = 47 Jun'13    HAs, RLS> on Mirapex0.5mg /d; seen by DrDohmeier...    Hx Psychosis> followed by DrPlovsky, not currently on medication... We reviewed prob list, meds, xrays and labs> see below for updates >> she had the 2013 flu vaccine in Aug'13; she brought a 4page med hx summary report... CXR 12/13 is clear- normal heart size, clear lungs- no change in subtle medial left apical lesion better delineated on old CTscans...  ~  January 15, 2012:  5mo ROV & add-on appt at pt's request due to memory issues> she brings a letter from her daughter to MrSnow about mother's memory, but she denies signif problem; she called DrWillis' office to be seen (he sees her for cerebrovasc dis, known 80% left MCA stenosis on CTA from 5/09) but was told they would not see her for Memory eval without a referral; she called here & was worked into the schedule so that we could evaluate her in advance of the desired neurology referral...    She also notes 2wk hx daily left temple region headaches w/ some tenderness noted; she rates the discomfort 1-2/10 & says it doesn't really hurt unless she touches the area; we are checking labs including Sed Rate...    MMSE 01/15/11 showed the pt to be oriented to person, place & time- Date= 1/?/2014;  Pres=Obama, VP=?? "I hate politics", Gov=??;  Recall- 3/3 immed but only 1/3 after distraction;  Serial 7's= [phone];  World backwards= DLROW; Proverbs were mixed in their interpretation...  She does not want to start any medications or get screening scans at this time, just  requests referral to DrWillis which we will gladly arrange... LABS 1/14:  Chems- wnl;  CBC- wnl;  TSH=9.38 on Levothy88;  Sed=13 Thyroid dose increased to Levothy135mcg/d...          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 ~  She continues periodic follow up by DrShockley [facility]), DrFowler [facility]), DrMurray ([facility])... 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 she uses Claritin & Flonase for sinuses.  Hx of BRONCHITIS, RECURRENT OTHER DISEASES OF  LUNG NOT ELSEWHERE CLASSIFIED (ICD-518.89) - there is a ~2cm left apical fluid density lesion ?nerve sheath tumor?  ~  CT Chest 3/10 (at [facility]) showed atherosclerotic dis in Ao & coronaries, calcif AoV, left lung apex extra-pleural mass ~2cm, 4mm LUL nodule, scat ground-glass opac, atelec, marked biliary ductal dil- consider MRCP... (Old films and CT Chest here 6/07 & 10/09 compared- not mentioned on 2007 scan but the 2009 scan noted lesion left apex 2.6 x 2.0cm <sl larger than 2007 scan in retrospect> ?neurogenic/ nerve sheath tumor). ~  Lesion not mentioned on routine CXRs> CXR 5/12 norm heart, clear lungs, NAD...  OBSTRUCTIVE SLEEP APNEA  - Hx mild OSA on Sleep Study by DrDohmeier... she never really used the CPAP and states that "now I'm fine off of it"... she does have some insomnia & uses BENEDRYL Prn.  HYPERTENSION (ICD-401.9) - controlled on COREG 25mg Bid...  ~  11/12:  BP=122/60 and pressures are good at home etc;  denies HA, visual changes, CP, palipit, dizziness, dyspnea, edema, etc; she has chronic fatigue symptoms. ~  5/13:  BP= 124/68 & she remains largely asymptomatic...  CORONARY ARTERY DISEASE (ICD-414.00)  - on COREG 25mg Bid, & supposed to be on ASA 81mg /d... non-obstructive disease w/ cath 9/06 showing 30% LAD lesion, good LVF... neg Myoview 3/06 no ischemia, EF=73%... she was prev followed by DrBensimhon for Cards. ~  10/13: she saw DrHochrein- HBP, CAD, risk factors, hx AFlutter- s/p ablation; EKG showed NSR, rate66, wnl, NAD; doing well w/o CV complaints, not exercising & advised to incr exercise program...  Hx of ATRIAL FLUTTER (ICD-427.32) - S/P catheter ablation in 2005 by DrKlein.  CEREBROVASCULAR DISEASE (ICD-437.9) - eval by DrWillis 5/09 and DrBritz (Neurosurg @ [facility]) confirms 80% left middle cerebral art stenosis which is felt to be asymptomatic... they advise ASA 81mg /d, and secondary risk factor modification...  HYPERLIPIDEMIA (ICD-272.4) - now on LIPITOR 40mg /d + FISH OIL Bid...  ~  FLP 10/08 on Vytor10/80 showed TChol 130, TG 139, HDL 38, LDL 64... ~  FLP 4/09 on Vytor10/80 showed TChol 138, TG 102, HDL 62, LDL 56... ~  Vytorin & Fish Oil stopped 11/09 in [place]. ~  Jan10: DrBensimhon started Lipitor 40/d. ~  FLP 6/10 on Lip40 showed TChol 148, TG 94, HDL 67, LDL 62 ~  FLP 6/11 on Lip40 showed TChol 186, TG 76, HDL 82, LDL 88 ~  FLP 5/12 on Lip40 showed TChol 156, TG 110, HDL 81, LDL 53 ~  FLP 6/13 on Lip40 showed TChol 142, TG 93, HDL 79, LDL 45  OBESITY (ICD-278.00) - she notes that her fibromyalgia makes it difficult for her to exercise. ~  weight 8/09 = 221# ~  weight 12/09 = 197# ~  weight 4/10 = 169#... great job w/ diet program. ~  weight 6/11 = 173# ~  weight 11/11 = 190#... we discussed diet + exercise. ~  Weight 5/12 = 189# ~  Weight 5/13 = 182#       << GI - Prev MANAGED BY DrJHAYES ("He released me")>> GASTROESOPHAGEAL REFLUX DISEASE (ICD-530.81) - followed by DrJHayes for GI on NEXIUM 40Bid... she has GERD, Hx gastric ulcer. DIVERTICULOSIS OF COLON (ICD-562.10) - hx diverticulitis in past... She takes PROBIOTIC & SIMETHACONE... IRRITABLE BOWEL  SYNDROME (ICD-564.1) - DrHayes' eval w/ colon reported to show "ulcers" c/w Crohn's dis... COLONIC POLYPS - Hx of divertics w/ diverticulitis in the past and IBS, and hx of Colon Polyps w/ prev colonoscopy in 4/06... see 10/09 hosp by Claudie Fisherman  and f/u colonoscopy w/ ?Crohn's... CROHN'S DISEASE (ICD-555.9) - on LIALDA 1.2gm- 3 tabs daily, QUESTRAN 2 scoops daily, & IMMODIUM Prn...       << ENDOCRINE - Prev MANAGED BY DrBALAN >> HYPERPARATHYROIDISM, HX OF (ICD-V12.2) - she had hypercalcemia w/ right inferior parathyroid gland resected 11/07 by DrGerkin... HYPOTHYROIDISM (ICD-244.9) - on SYNTHROID daily... prev followed by DrBalan. ~  labs 6/07 showed TSH= 5.16... managed by DrBalan at that time. ~  labs here 7/10 on Levothy112 showed TSH= 0.16... rec> decr to 1/2 tab & f/u lab. ~  labs here 10/10 showed TSH= 10.04... rec> change to 64mcg/d. ~  labs here 6/11 on Levothy75 showed TSH= 6.84... rec> step up to 12mcg/d. ~  Labs 5/12 on Levothy88 showed TSH= 13.14... rec to incr to 154mcg/d (she never did)... ~  Labs 11/12 on Levo88 showed TSH= 2.63... Continue same. ~  Labs 5/13 on Levo88 showed TSH= 3.10 ~  Labs 1/14 on Levo88 showed TSH= 9.38 & we will incr to Levothy133mcg/d...       << RHEUM - MAMAGED BY DrDEVESHWAR >> FIBROMYALGIA (ICD-729.1) - followed by DrDeveshwar... she uses TRAMADOL & LIDODERM patches for pain. OSTEOPENIA (ICD-733.90) - on FOSAMAX 70mg /wk, ca++, vits... prev followed by DrBalan... ~  last BMD 9/08 showed TScores -0.8 in Spine & -2.3 in right FemNeck... ~  f/u BMD 10/10 showed TScores invalid in Spine, & -2.2 in right FemNeck... continue Rx. ~  BMD here 12/12 showed TScores+0.8 in spine (osteophytes & scoliosis), and -2.1 in left FemNeck... Continue same. ~  Nuclear Bone Scan 2/13 showed areas of degenerative uptake in shoulders, sternoclavic joints, lower lum spine, bilat hips & knees & feet... ~  2/13: XRay Lumbar spine showed mild scoliosis, mod degen changes at  L3-4 & L4-5, vasc calcif also seen...  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  SYNCOPE (ICD-780.2) - after a 2009 trip to visit family in [place]- she was flying home to [place] and passed out on the plane... she was taken to a hosp outside of [place] and adm for several days... ~ syncopal spell "altered level of consciusness" she remembers being on the plane and next in the ER...  their DC note indicates poss seizure but nothing to substantiate this (no seiz activ noted by anyone)...  ~  MRI Brain was neg x  max sinusitis... MRA however showed 80% stenosis in the left MCA... ~  CDoppler showed mild plaque, norm waveforms, no signif stenoses... ~  2DEcho showed basically WNL, borderline conc LVH, EF=55-60%...  RESTLESS LEG SYNDROME (ICD-333.94) - she's been on MIRAPEX 0.5mg  Qd... ~  10/10: c/o incr symptoms and instructed to incr Mirapex to 2 tabs...  PSYCHOSIS (ICD-298.9) - this was attributed to Prednisone therapy for the Crohn's dis... hosp in [place] and mult meds discontinued and changed by Psychiatrists... now followed by [facility] and he is off the prev Depakote & Keppra... & she is also off the prev Zoloft & Restoril...   Past Surgical History  Procedure Date  . Parathyroid adenoma surgery   . Cholecystectomy   . Moh';s surg left lower eye lid surgery and lid reconstruction surgery 2/10    Outpatient Encounter Prescriptions as of 01/15/2012  Medication Sig Dispense Refill  . ascorbic acid (SUPER C COMPLEX) 500 MG tablet Take 1 tablet (500 mg total) by mouth daily.      Marland Kitchen aspirin 81 MG tablet Take 81 mg by mouth daily.        Marland Kitchen  Biotin 5000 MCG TABS Take 1 tablet by mouth daily.        . Calcium-Magnesium 500-250 MG TABS Take 2 tablets by mouth daily.      . carvedilol (COREG) 25 MG tablet Take 1 tablet (25 mg total) by mouth 2 (two) times daily with a meal.  180 tablet  1  . Cholecalciferol (VITAMIN D3) 2000 UNITS TABS  Take 1 tablet by mouth daily.      . cholestyramine (QUESTRAN) 4 GM/DOSE powder 2 scoops per day       . clotrimazole-betamethasone (LOTRISONE) cream Apply topically 2 (two) times daily. As directed  30 g  6  . Evening Primrose Oil 500 MG CAPS 1 tablet twice a day      . fish oil-omega-3 fatty acids 1000 MG capsule 3 capsules daily      . fluticasone (FLONASE) 50 MCG/ACT nasal spray 2 sprays by Nasal route daily.        . Glycerin-Polysorbate 80 (REFRESH DRY EYE THERAPY) 1-1 % SOLN Apply 1 drop to eye 4 (four) times daily.      Marland Kitchen guaifenesin (HUMIBID E) 400 MG TABS 1 TABLET BY MOUTH TWO TIMES DAILY      . levothyroxine (SYNTHROID, LEVOTHROID) 88 MCG tablet Take 1 tablet (88 mcg total) by mouth daily.  90 tablet  3  . lidocaine (LIDODERM) 5 % Apply as directed  30 patch  0  . LIPITOR 40 MG tablet TAKE 1 TABLET BY MOUTH AT BEDTIME  30 tablet  2  . loperamide (IMODIUM A-D) 2 MG tablet As needed      . meclizine (ANTIVERT) 25 MG tablet Take 1 tablet every 6 hours as needed       . mesalamine (LIALDA) 1.2 G EC tablet Take 3 tablets by mouth once a day as directed by Dr. Madilyn Fireman  270 tablet  1  . Multiple Vitamin (MULTIVITAMIN PO) Once a day       . pramipexole (MIRAPEX) 0.5 MG tablet TAKE 1 TABLET BY MOUTH TWICE DAILY  180 tablet  0  . PROBIOTIC CAPS Take 1 capsule by mouth daily.      . Simethicone (PHAZYME) 180 MG CAPS 3 times a day       . traMADol (ULTRAM) 50 MG tablet TAKE 1 TABLET BY MOUTH EVERY 8 HOURS AS NEEDED FOR PAIN  270 tablet  0    Allergies  Allergen Reactions  . Bacitracin     REACTION: redness, rash, severe itching  . Carbamazepine     Intolerant to Tegretol.  REACTION: dizziness, blurred vision, nausea, HA, insomnia  . Codeine     REACTION: nause and vomiting  . Diclofenac Sodium     REACTION: severe headache  . Fentanyl     REACTION: chest pain, HBP, with second patch experienced vertigo and nausea  . Fluoxetine Hcl     REACTION: dizziness, blurred vision, nausea, HA,  insomnia  . Hydromorphone Hcl     REACTION: intolerant to dilaudid with nausea, vomiting  . Indomethacin     REACTION: dizziness, blurred vision, nausea, HA, insomnia  . Lisinopril     REACTION: cough  . Meperidine Hcl     REACTION: intolerant to demerol with nausea, vomiting  . Morphine     REACTION: n/v  . Prednisone     REACTION: pt had psychiatric episode w/ hosp - lost memory x3weeks  . Pregabalin     REACTION: severe restless legs, insomnia  . Propranolol Hcl  Intolerant to Inderal.  REACTION: dizziness, blurred vision, nausea, HA, insomnia  . Ropinirole Hcl     REACTION: severe restless legs and insomnia  . Sulfamethoxazole     REACTION: nausea, gas, loose stools  . Tramadol     REACTION: dizziness, blurred vision, nausea, HA, insomnia.     Current Medications, Allergies, Past Medical History, Past Surgical History, Family History, and Social History were reviewed in [employer] record.     Review of Systems        See HPI - all other systems neg except as noted... The patient complains of weight gain, dyspnea on exertion, and muscle weakness.  The patient denies anorexia, fever, weight loss, vision loss, decreased hearing, hoarseness, chest pain, syncope, peripheral edema, prolonged cough, headaches, hemoptysis, abdominal pain, melena, hematochezia, severe indigestion/heartburn, hematuria, incontinence, suspicious skin lesions, transient blindness, difficulty walking, depression, unusual weight change, abnormal bleeding, enlarged lymph nodes, angioedema, and breast masses.    Objective:   Physical Exam     WD, Overweight, 79 y/o WF in NAD...  GENERAL:  Alert & oriented; pleasant & cooperative... HEENT:  [place]/AT, EOM-wnl, EACs-clear, TMs-wnl, NOSE-clear, THROAT-clear & wnl.  << s/p mult surg left lower lid- sl red, improved... NECK:  Supple w/ fairROM; no JVD; normal carotid impulses w/o bruits; no thyromegaly or nodules palpated; no  lymphadenopathy. CHEST:  Clear to P & A; without wheezes/ rales/ or rhonchi. HEART:  Regular Rhythm; gr 1/6 SEM without rubs/ or gallops. ABDOMEN:  Obese soft & nontender; normal bowel sounds; no organomegaly or masses detected. EXT: without deformities, mild arthritic changes; no varicose veins/ +venous insuffic/ no edema... NEURO:  CN's intact; no focal neuro deficits... DERM:  No lesions noted; no rash etc...  RADIOLOGY DATA:  Reviewed in the EPIC EMR & discussed w/ the patient...  LABORATORY DATA:  Reviewed in the EPIC EMR & discussed w/ the patient...   Assessment & Plan:    MEMORY>> Abbreviated MMSE shows early but definite lapses in responses & she requests that we set up the referral to DrWillis...   Ophthalmology>  Follow[facility] at [facility] by DrFowler w/ numerous surg procedures for her Merkel cell tumor of the left lower eyelid...  HBP>  Controlled on the Coreg but needs better low sodium wt reducing diet...  Cardiac> CAD, Hx AFlutter s/p ablation>  Stable on ASA + the Coreg, continue same...  HYPERLIPID>  Stable on the Lip40...  Obesity> she has not been successful losing the wt, we reviewed wt reducing diet + exercise...  GI>  Followed by [facility] as noted for her Crohn's etc & she has been doing well, "he released me" she says...  HYPOTHYROID>  TSH is elev at 9.38 on the Synthroid88 (we will confirm taking it daily) & needs incr dose to 169mcg/d...  Rheum>  FM/ osteopenia>  Followed by Rober Minion for Rheum, continue her regimen...   Patient's Medications  New Prescriptions   LEVOTHYROID increased to 198mcg/d...  Previous Medications   ASCORBIC ACID (SUPER C COMPLEX) 500 MG TABLET    Take 1 tablet (500 mg total) by mouth daily.   ASPIRIN 81 MG TABLET    Take 81 mg by mouth daily.     BIOTIN 5000 MCG TABS    Take 1 tablet by mouth daily.     CALCIUM-MAGNESIUM 500-250 MG TABS    Take 2 tablets by mouth daily.   CARVEDILOL (COREG) 25 MG TABLET    Take 1 tablet (25 mg  total) by mouth 2 (  two) times daily with a meal.   CHOLECALCIFEROL (VITAMIN D3) 2000 UNITS TABS    Take 1 tablet by mouth daily.   CHOLESTYRAMINE (QUESTRAN) 4 GM/DOSE POWDER    2 scoops per day    CLOTRIMAZOLE-BETAMETHASONE (LOTRISONE) CREAM    Apply topically 2 (two) times daily. As directed   EVENING PRIMROSE OIL 500 MG CAPS    1 tablet twice a day   FISH OIL-OMEGA-3 FATTY ACIDS 1000 MG CAPSULE    3 capsules daily   FLUTICASONE (FLONASE) 50 MCG/ACT NASAL SPRAY    2 sprays by Nasal route daily.     GLYCERIN-POLYSORBATE 80 (REFRESH DRY EYE THERAPY) 1-1 % SOLN    Apply 1 drop to eye 4 (four) times daily.   GUAIFENESIN (HUMIBID E) 400 MG TABS    1 TABLET BY MOUTH TWO TIMES DAILY   LEVOTHYROXINE (SYNTHROID, LEVOTHROID) 100 MCG TABLET    Take 1 tablet (100 mcg total) by mouth daily.   LIDOCAINE (LIDODERM) 5 %    Apply as directed   LIPITOR 40 MG TABLET    TAKE 1 TABLET BY MOUTH AT BEDTIME   LOPERAMIDE (IMODIUM A-D) 2 MG TABLET    As needed   MECLIZINE (ANTIVERT) 25 MG TABLET    Take 1 tablet every 6 hours as needed    MESALAMINE (LIALDA) 1.2 G EC TABLET    Take 3 tablets by mouth once a day as directed by Dr. Madilyn Fireman   MULTIPLE VITAMIN (MULTIVITAMIN PO)    Once a day    PROBIOTIC CAPS    Take 1 capsule by mouth daily.   SIMETHICONE (PHAZYME) 180 MG CAPS    3 times a day   Modified Medications   Modified Medication Previous Medication   PRAMIPEXOLE (MIRAPEX) 0.5 MG TABLET pramipexole (MIRAPEX) 0.5 MG tablet      Take 1 tablet (0.5 mg total) by mouth 2 (two) times daily.    TAKE 1 TABLET BY MOUTH TWICE DAILY   TRAMADOL (ULTRAM) 50 MG TABLET traMADol (ULTRAM) 50 MG tablet      Take 1 tablet (50 mg total) by mouth every 8 (eight) hours as needed for pain.    TAKE 1 TABLET BY MOUTH EVERY 8 HOURS AS NEEDED FOR PAIN  Discontinued Medications   No medications on file

## 2012-01-15 NOTE — Patient Instructions (Addendum)
Today we updated your med list in our EPIC system...    Continue your current medications the same...    We refilled your TRAMADOL which you may use for your headache etc...  Today we did some follow up blood work...    We will call you w/ the results...  We will arrange for an appt w/ DrWillis for further eval of your headaches and memory concerns...  Call for any questions or if we can be of service in any way.Marland KitchenMarland Kitchen

## 2012-01-17 ENCOUNTER — Other Ambulatory Visit (INDEPENDENT_AMBULATORY_CARE_PROVIDER_SITE_OTHER): Payer: Medicare Other

## 2012-01-17 DIAGNOSIS — E785 Hyperlipidemia, unspecified: Secondary | ICD-10-CM | POA: Diagnosis not present

## 2012-01-17 DIAGNOSIS — R51 Headache: Secondary | ICD-10-CM

## 2012-01-17 DIAGNOSIS — I10 Essential (primary) hypertension: Secondary | ICD-10-CM | POA: Diagnosis not present

## 2012-01-17 DIAGNOSIS — E039 Hypothyroidism, unspecified: Secondary | ICD-10-CM | POA: Diagnosis not present

## 2012-01-17 DIAGNOSIS — R413 Other amnesia: Secondary | ICD-10-CM | POA: Diagnosis not present

## 2012-01-17 LAB — CBC WITH DIFFERENTIAL/PLATELET
Eosinophils Relative: 2.2 % (ref 0.0–5.0)
HCT: 38.4 % (ref 36.0–46.0)
Hemoglobin: 12.9 g/dL (ref 12.0–15.0)
Lymphs Abs: 3.3 10*3/uL (ref 0.7–4.0)
MCV: 95.7 fl (ref 78.0–100.0)
Monocytes Absolute: 0.7 10*3/uL (ref 0.1–1.0)
Neutro Abs: 2.9 10*3/uL (ref 1.4–7.7)
Platelets: 169 10*3/uL (ref 150.0–400.0)
WBC: 7.1 10*3/uL (ref 4.5–10.5)

## 2012-01-17 LAB — BASIC METABOLIC PANEL
BUN: 12 mg/dL (ref 6–23)
Calcium: 9.4 mg/dL (ref 8.4–10.5)
GFR: 79.52 mL/min (ref >60.00)
Potassium: 4.2 mEq/L (ref 3.5–5.1)
Sodium: 140 mEq/L (ref 135–145)

## 2012-01-17 LAB — HEPATIC FUNCTION PANEL
AST: 34 U/L (ref 0–37)
Alkaline Phosphatase: 48 U/L (ref 39–117)
Total Bilirubin: 0.9 mg/dL (ref 0.3–1.2)

## 2012-01-17 LAB — TSH: TSH: 9.38 u[IU]/mL — ABNORMAL HIGH (ref 0.35–5.50)

## 2012-01-22 ENCOUNTER — Other Ambulatory Visit: Payer: Self-pay | Admitting: Dermatology

## 2012-01-22 DIAGNOSIS — L570 Actinic keratosis: Secondary | ICD-10-CM | POA: Diagnosis not present

## 2012-01-22 DIAGNOSIS — L821 Other seborrheic keratosis: Secondary | ICD-10-CM | POA: Diagnosis not present

## 2012-01-22 DIAGNOSIS — Z85828 Personal history of other malignant neoplasm of skin: Secondary | ICD-10-CM | POA: Diagnosis not present

## 2012-01-22 DIAGNOSIS — D485 Neoplasm of uncertain behavior of skin: Secondary | ICD-10-CM | POA: Diagnosis not present

## 2012-01-22 DIAGNOSIS — L259 Unspecified contact dermatitis, unspecified cause: Secondary | ICD-10-CM | POA: Diagnosis not present

## 2012-01-24 ENCOUNTER — Telehealth: Payer: Self-pay | Admitting: Pulmonary Disease

## 2012-01-24 ENCOUNTER — Other Ambulatory Visit: Payer: Self-pay | Admitting: Pulmonary Disease

## 2012-01-24 MED ORDER — LEVOTHYROXINE SODIUM 100 MCG PO TABS: 100.0000 ug | ORAL_TABLET | Freq: Every day | ORAL | Status: DC

## 2012-01-24 NOTE — Telephone Encounter (Signed)
I spoke with pt. She stated she is still having the dull achy pain left temple. It's not going away but not any worse. She is going to see Dr Anne Hahn on 02/05/12. She stated this is just an FYI for SN.   Last OV 01/15/12

## 2012-01-27 NOTE — Telephone Encounter (Signed)
SN is aware. 

## 2012-02-03 DIAGNOSIS — A499 Bacterial infection, unspecified: Secondary | ICD-10-CM | POA: Diagnosis not present

## 2012-02-05 ENCOUNTER — Other Ambulatory Visit (INDEPENDENT_AMBULATORY_CARE_PROVIDER_SITE_OTHER): Payer: Medicare Other

## 2012-02-05 ENCOUNTER — Other Ambulatory Visit: Payer: Self-pay | Admitting: Pulmonary Disease

## 2012-02-05 DIAGNOSIS — D518 Other vitamin B12 deficiency anemias: Secondary | ICD-10-CM | POA: Diagnosis not present

## 2012-02-05 DIAGNOSIS — F29 Unspecified psychosis not due to a substance or known physiological condition: Secondary | ICD-10-CM

## 2012-02-05 DIAGNOSIS — I672 Cerebral atherosclerosis: Secondary | ICD-10-CM | POA: Diagnosis not present

## 2012-02-05 DIAGNOSIS — E538 Deficiency of other specified B group vitamins: Secondary | ICD-10-CM

## 2012-02-05 DIAGNOSIS — R6889 Other general symptoms and signs: Secondary | ICD-10-CM | POA: Diagnosis not present

## 2012-02-05 DIAGNOSIS — R413 Other amnesia: Secondary | ICD-10-CM | POA: Diagnosis not present

## 2012-02-06 ENCOUNTER — Other Ambulatory Visit: Payer: Self-pay | Admitting: Neurology

## 2012-02-06 DIAGNOSIS — D518 Other vitamin B12 deficiency anemias: Secondary | ICD-10-CM

## 2012-02-06 DIAGNOSIS — I672 Cerebral atherosclerosis: Secondary | ICD-10-CM

## 2012-02-06 DIAGNOSIS — R6889 Other general symptoms and signs: Secondary | ICD-10-CM

## 2012-02-06 DIAGNOSIS — R413 Other amnesia: Secondary | ICD-10-CM

## 2012-02-06 LAB — RPR

## 2012-02-13 ENCOUNTER — Ambulatory Visit
Admission: RE | Admit: 2012-02-13 | Discharge: 2012-02-13 | Disposition: A | Discharge status: Home / Self Care | Payer: Medicare Other | Source: Ambulatory Visit | Attending: Neurology | Admitting: Neurology

## 2012-02-13 DIAGNOSIS — D518 Other vitamin B12 deficiency anemias: Secondary | ICD-10-CM | POA: Diagnosis not present

## 2012-02-13 DIAGNOSIS — R6889 Other general symptoms and signs: Secondary | ICD-10-CM

## 2012-02-13 DIAGNOSIS — R413 Other amnesia: Secondary | ICD-10-CM | POA: Diagnosis not present

## 2012-02-13 DIAGNOSIS — I672 Cerebral atherosclerosis: Secondary | ICD-10-CM | POA: Diagnosis not present

## 2012-03-13 ENCOUNTER — Telehealth: Payer: Self-pay | Admitting: *Deleted

## 2012-03-13 NOTE — Telephone Encounter (Signed)
CALLED PATIENT TO ALTER FU VISIT FOR 03-25-12 DUE TO DR. Dayton Scrape BEING ON VACATION, RESCHEDULED FOR 04-14-12, PATIENT AGREED TO NEW APPT.

## 2012-03-25 ENCOUNTER — Ambulatory Visit: Payer: Medicare Other | Admitting: Radiation Oncology

## 2012-04-01 DIAGNOSIS — M461 Sacroiliitis, not elsewhere classified: Secondary | ICD-10-CM | POA: Diagnosis not present

## 2012-04-08 DIAGNOSIS — M461 Sacroiliitis, not elsewhere classified: Secondary | ICD-10-CM | POA: Diagnosis not present

## 2012-04-13 ENCOUNTER — Telehealth: Payer: Self-pay | Admitting: Pulmonary Disease

## 2012-04-13 ENCOUNTER — Encounter: Payer: Self-pay | Admitting: Oncology

## 2012-04-13 NOTE — Telephone Encounter (Signed)
I spoke with pt. She stated this AM at 10:30 she had an "episode" where she was dizzy. So she sat down and she felt disoriented. She took a meclizine. During this episode per pt she had a BM in her panties and does not remember this. She did not recognize where she was during this time. He husband was with her. She stated she slept for an hour per spouse. She has not had this happen before and stated this feeling came out of no where. She stated she feels fine right now and just feels tired. She stated yesterday the only thing she did was visit her brother in law in the hospital. Pt did not go to the ED or call 911 when this happened. Please advise SN thanks Last OV 01/15/12 Pending 06/10/12  Allergies  Allergen Reactions  . Bacitracin     REACTION: redness, rash, severe itching  . Carbamazepine     Intolerant to Tegretol.  REACTION: dizziness, blurred vision, nausea, HA, insomnia  . Codeine     REACTION: nause and vomiting  . Diclofenac Sodium     REACTION: severe headache  . Fentanyl     REACTION: chest pain, HBP, with second patch experienced vertigo and nausea  . Fluoxetine Hcl     REACTION: dizziness, blurred vision, nausea, HA, insomnia  . Hydromorphone Hcl     REACTION: intolerant to dilaudid with nausea, vomiting  . Indomethacin     REACTION: dizziness, blurred vision, nausea, HA, insomnia  . Lisinopril     REACTION: cough  . Meperidine Hcl     REACTION: intolerant to demerol with nausea, vomiting  . Morphine     REACTION: n/v  . Prednisone     REACTION: pt had psychiatric episode w/ hosp - lost memory x3weeks  . Pregabalin     REACTION: severe restless legs, insomnia  . Propranolol Hcl     Intolerant to Inderal.  REACTION: dizziness, blurred vision, nausea, HA, insomnia  . Ropinirole Hcl     REACTION: severe restless legs and insomnia  . Sulfamethoxazole     REACTION: nausea, gas, loose stools  . Tramadol     REACTION: dizziness, blurred vision, nausea, HA, insomnia.

## 2012-04-13 NOTE — Telephone Encounter (Signed)
Per SN---  She will need a neuro check.  Pt will need to see Dr. Jannifer Franklin for eval of this.  thanks

## 2012-04-13 NOTE — Telephone Encounter (Signed)
(  continued)  Pt states that her husband had to help her to the bathroom & at that time, pt became aware that she had an accident (BM) in her panties.  Asks to speak w/ SN's nurse ASAP on what to do.  Satira Anis

## 2012-04-13 NOTE — Telephone Encounter (Signed)
LMTCB

## 2012-04-14 ENCOUNTER — Ambulatory Visit: Payer: Medicare Other | Admitting: Radiation Oncology

## 2012-04-14 ENCOUNTER — Ambulatory Visit
Admission: RE | Admit: 2012-04-14 | Discharge: 2012-04-14 | Disposition: A | Discharge status: Home / Self Care | Payer: Medicare Other | Source: Ambulatory Visit | Attending: Radiation Oncology | Admitting: Radiation Oncology

## 2012-04-14 ENCOUNTER — Telehealth: Payer: Self-pay | Admitting: *Deleted

## 2012-04-14 VITALS — BP 161/59 | HR 81 | Temp 98.6°F | Ht 62.0 in | Wt 175.7 lb

## 2012-04-14 DIAGNOSIS — C4A30 Merkel cell carcinoma of unspecified part of face: Secondary | ICD-10-CM | POA: Diagnosis not present

## 2012-04-14 DIAGNOSIS — D497 Neoplasm of unspecified behavior of endocrine glands and other parts of nervous system: Secondary | ICD-10-CM

## 2012-04-14 NOTE — Progress Notes (Addendum)
[name] is here for follow up accompanied by her husband.  She was treated on her left lower eye lid.  She denies pain and fatigue.  She did have an episode yesterday where she was disoriented and fell on the follow.  She also was incontinent.  She is contacting her Neurologist for this.  She does have tearing in her left eye.  She uses thera-fresh eye drops and systane eye drops that help a little.  She states that her eyelashes grow inwards and this also irritates her eye.

## 2012-04-14 NOTE — Telephone Encounter (Signed)
Spoke with pt and notified of recs per SN. Pt verbalized understanding and states nothing further needed.  

## 2012-04-14 NOTE — Telephone Encounter (Signed)
Patient called stating she had an episode on yesterday in which she fell due to dizziness and she noticed that she had a bm in her panties. Patient would like to speak with physician.

## 2012-04-14 NOTE — Telephone Encounter (Signed)
I called patient. The patient had an episode yesterday where she was sitting in a chair, and she got up and started walking. The patient began feeling dizzy, and diaphoretic. The patient did not black out, but she slid down to the floor, and she had a bowel movement. The patient does not recall any palpitations of the heart, and she did not lose her vision. The patient sounds like she may have had a near fainting. I'll try to get worked in for revisit, and look for orthostatic blood pressure drops.

## 2012-04-14 NOTE — Addendum Note (Signed)
Encounter addended by: Maryln Gottron, MD on: 04/14/2012 12:08 PM<BR>     Documentation filed: Notes Section

## 2012-04-14 NOTE — Telephone Encounter (Signed)
Pt returned call. [name] °

## 2012-04-14 NOTE — Progress Notes (Addendum)
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 She had more reconstructive surgery with  Dr. Ether Griffins at [facility] this past year. She is bothered by some eyelashes going inwards along her left lower eyelid. She does have some tearing. She maintains her followup with Dr. Ether Griffins in the Department of ophthalmology. She uses eyedrops daily. Her vision remains good.  Physical examination: Alert and oriented. Filed Vitals:   04/14/12 1108  BP: 161/59  Pulse: 81  Temp: 98.6 F (37 C)   Head and neck examination: Reconstructive changes along the left eyelids. No visible or palpable evidence for recurrent disease. Remainder of her eye examination is within normal limits. There is no palpable facial, periauricular, or cervical lymphadenopathy.  Impression: No evidence for recurrent disease. It is unlikely for her to have a recurrence at this point in time.  Plan: She'll maintain her followup with Dr. Ether Griffins at [facility]. I've not scheduled her for a formal followup visit.

## 2012-04-16 ENCOUNTER — Ambulatory Visit (INDEPENDENT_AMBULATORY_CARE_PROVIDER_SITE_OTHER): Payer: Medicare Other | Admitting: Neurology

## 2012-04-16 ENCOUNTER — Encounter: Payer: Self-pay | Admitting: Neurology

## 2012-04-16 VITALS — BP 118/68 | HR 77 | Ht 62.0 in | Wt 171.0 lb

## 2012-04-16 DIAGNOSIS — R55 Syncope and collapse: Secondary | ICD-10-CM

## 2012-04-16 MED ORDER — MECLIZINE HCL 25 MG PO TABS: 25.0000 mg | ORAL_TABLET | Freq: Three times a day (TID) | ORAL | Status: DC | PRN

## 2012-04-16 NOTE — Patient Instructions (Signed)
Syncope Syncope is a fainting spell. This means the person loses consciousness and drops to the ground. The person is generally unconscious for less than 5 minutes. The person may have some muscle twitches for up to 15 seconds before waking up and returning to normal. Syncope occurs more often in elderly people, but it can happen to anyone. While most causes of syncope are not dangerous, syncope can be a sign of a serious medical problem. It is important to seek medical care.  CAUSES  Syncope is caused by a sudden decrease in blood flow to the brain. The specific cause is often not determined. Factors that can trigger syncope include:  Taking medicines that lower blood pressure.  Sudden changes in posture, such as standing up suddenly.  Taking more medicine than prescribed.  Standing in one place for too long.  Seizure disorders.  Dehydration and excessive exposure to heat.  Low blood sugar (hypoglycemia).  Straining to have a bowel movement.  Heart disease, irregular heartbeat, or other circulatory problems.  Fear, emotional distress, seeing blood, or severe pain. SYMPTOMS  Right before fainting, you may:  Feel dizzy or lightheaded.  Feel nauseous.  See all white or all black in your field of vision.  Have cold, clammy skin. DIAGNOSIS  Your caregiver will ask about your symptoms, perform a physical exam, and perform electrocardiography (ECG) to record the electrical activity of your heart. Your caregiver may also perform other heart or blood tests to determine the cause of your syncope. TREATMENT  In most cases, no treatment is needed. Depending on the cause of your syncope, your caregiver may recommend changing or stopping some of your medicines. HOME CARE INSTRUCTIONS  Have someone stay with you until you feel stable.  Do not drive, operate machinery, or play sports until your caregiver says it is okay.  Keep all follow-up appointments as directed by your  caregiver.  Lie down right away if you start feeling like you might faint. Breathe deeply and steadily. Wait until all the symptoms have passed.  Drink enough fluids to keep your urine clear or pale yellow.  If you are taking blood pressure or heart medicine, get up slowly, taking several minutes to sit and then stand. This can reduce dizziness. SEEK IMMEDIATE MEDICAL CARE IF:   You have a severe headache.  You have unusual pain in the chest, abdomen, or back.  You are bleeding from the mouth or rectum, or you have black or tarry stool.  You have an irregular or very fast heartbeat.  You have pain with breathing.  You have repeated fainting or seizure-like jerking during an episode.  You faint when sitting or lying down.  You have confusion.  You have difficulty walking.  You have severe weakness.  You have vision problems. If you fainted, call your local emergency services (911 in [place] Do not drive yourself to the hospital.  MAKE SURE YOU:  Understand these instructions.  Will watch your condition.  Will get help right away if you are not doing well or get worse. Document Released: 12/24/2004 Document Revised: 06/25/2011 Document Reviewed: 02/22/2011 [facility] Patient Information 2013 [employer].

## 2012-04-16 NOTE — Progress Notes (Signed)
Reason for visit: Syncope  [name] is an 79 y.o. female  History of present illness:  [name] is a 79 year old right-handed white female with a history of some issues with memory. The patient was seen through this office in January 2014, as her children have become concerned about the memory. The patient was noted to have a mild memory deficit that time. The patient indicates that she has chronic insomnia, and chronic fatigue during the day that has been present most of her life. The patient believes that since last seen, her memory issues have significantly improved. Having said that, the patient went to our old office today, and she never remembered coming to this current location 3 months ago. The patient comes to this office today mainly because of an episode of near syncope that occurred several days ago. The patient was in a recliner chair, and she got up to go to the kitchen. The patient took 5 or 6 steps, and then she began to become dizzy. The patient was noted to be diaphoretic. The patient started to fall, and her husband prevented a full fall. She was helped gently to the floor, and she was noted to be dazed, but not completely unconscious. The patient was not stiffening or jerking. The patient did have a bowel movement. The patient denies any nausea or abdominal cramping before, during, or after the episode. The patient has not had any episodes since or before. The patient denied any chest pains, or palpitations of the heart. The patient had no focal numbness or weakness of the face, arms, or legs. The patient does not recall dimming or loss of vision with the event. The patient got up and went to the couch, and took meclizine, and lay down for several hours. The patient felt normal at that point.  Past Medical History  Diagnosis Date  . Merkel cell tumor   . OSA (obstructive sleep apnea)   . History of bronchitis   . Other diseases of lung, not elsewhere classified   .  Hypertension   . CAD (coronary artery disease)     LAD 30% 2006  . History of atrial flutter   . Cerebrovascular disease   . Hyperlipemia   . Obesity   . GERD (gastroesophageal reflux disease)   . Diverticulosis of colon   . IBS (irritable bowel syndrome)   . Colonic polyp   . Crohn's disease   . Hypothyroidism   . History of hyperparathyroidism   . Fibromyalgia   . Osteopenia   . History of headache   . Syncope   . Restless legs syndrome (RLS)   . Psychosis   . History of tuberculosis   . Radiation 05/2008    5000 cGy to left lower eyelid, parotid lymphatics and upper neck  . Memory disturbance   . Bipolar affective     History of psychosis    Past Surgical History  Procedure Laterality Date  . Parathyroid adenoma surgery    . Cholecystectomy    . Moh';s surg left lower eye lid surgery and lid reconstruction surgery  2/10  . Bladder suspension    . Cataract extraction Bilateral   . Appendectomy    . Tonsillectomy and adenoidectomy    . Lumbar laminectomy    . Incision and drainage perirectal abscess    . Heel spur excision    . Nasal sinus surgery Right     Right maxillary    Family History  Problem Relation Age  of Onset  . Colon cancer      grandmother  . Arthritis Mother   . Hypertension Mother   . Ulcers Mother   . Heart failure Father   . Pulmonary embolism      Social history:  reports that she quit smoking about 52 years ago. Her smoking use included Cigarettes. She smoked 0.00 packs per day for 6 years. She has never used smokeless tobacco. She reports that she does not drink alcohol or use illicit drugs.  Allergies:  Allergies  Allergen Reactions  . Bacitracin     REACTION: redness, rash, severe itching  . Carbamazepine     Intolerant to Tegretol.  REACTION: dizziness, blurred vision, nausea, HA, insomnia  . Codeine     REACTION: nause and vomiting  . Diclofenac Sodium     REACTION: severe headache  . Fentanyl     REACTION: chest pain, HBP,  with second patch experienced vertigo and nausea  . Fluoxetine Hcl     REACTION: dizziness, blurred vision, nausea, HA, insomnia  . Hydromorphone Hcl     REACTION: intolerant to dilaudid with nausea, vomiting  . Indomethacin     REACTION: dizziness, blurred vision, nausea, HA, insomnia  . Lisinopril     REACTION: cough  . Meperidine Hcl     REACTION: intolerant to demerol with nausea, vomiting  . Morphine     REACTION: n/v  . Prednisone     REACTION: pt had psychiatric episode w/ hosp - lost memory x3weeks  . Pregabalin     REACTION: severe restless legs, insomnia  . Propranolol Hcl     Intolerant to Inderal.  REACTION: dizziness, blurred vision, nausea, HA, insomnia  . Ropinirole Hcl     REACTION: severe restless legs and insomnia  . Sulfamethoxazole     REACTION: nausea, gas, loose stools  . Tramadol     REACTION: dizziness, blurred vision, nausea, HA, insomnia.     Medications:  Current Outpatient Prescriptions on File Prior to Visit  Medication Sig Dispense Refill  . aspirin 81 MG tablet Take 81 mg by mouth daily.        Marland Kitchen Bioflavonoid Products (COMPLEX C SR PO) Take 1 tablet by mouth daily.      . Biotin 5000 MCG TABS Take 1 tablet by mouth daily.        . Calcium-Magnesium 500-250 MG TABS Take 2 tablets by mouth daily.      . carvedilol (COREG) 25 MG tablet Take 1 tablet (25 mg total) by mouth 2 (two) times daily with a meal.  180 tablet  1  . Cholecalciferol (VITAMIN D3) 2000 UNITS TABS Take 1 tablet by mouth daily.      . cholestyramine (QUESTRAN) 4 GM/DOSE powder 2 scoops per day       . clotrimazole-betamethasone (LOTRISONE) cream Apply topically 2 (two) times daily. As directed  30 g  6  . Evening Primrose Oil 500 MG CAPS 1 tablet twice a day      . fexofenadine (ALLEGRA) 30 MG tablet Take 30 mg by mouth daily.      . fish oil-omega-3 fatty acids 1000 MG capsule 3 capsules daily      . fluticasone (FLONASE) 50 MCG/ACT nasal spray 2 sprays by Nasal route daily.         . Glycerin-Polysorbate 80 (REFRESH DRY EYE THERAPY) 1-1 % SOLN Apply 1 drop to eye 4 (four) times daily.      Marland Kitchen guaifenesin (HUMIBID E) 400 MG  TABS 1 TABLET BY MOUTH TWO TIMES DAILY      . lidocaine (LIDODERM) 5 % Apply as directed  30 patch  0  . LIPITOR 40 MG tablet TAKE 1 TABLET BY MOUTH AT BEDTIME  30 tablet  2  . loperamide (IMODIUM A-D) 2 MG tablet As needed      . Multiple Vitamin (MULTIVITAMIN PO) Once a day       . pramipexole (MIRAPEX) 0.5 MG tablet Take 1 tablet (0.5 mg total) by mouth 2 (two) times daily.  180 tablet  3  . PROBIOTIC CAPS Take 1 capsule by mouth daily.      . Simethicone (PHAZYME) 180 MG CAPS 3 times a day       . traMADol (ULTRAM) 50 MG tablet Take 1 tablet (50 mg total) by mouth every 8 (eight) hours as needed for pain.  270 tablet  1  . levothyroxine (SYNTHROID, LEVOTHROID) 100 MCG tablet Take 88 mcg by mouth daily.      . mesalamine (LIALDA) 1.2 G EC tablet Take 3 tablets by mouth once a day as directed by Dr. Amedeo Plenty  270 tablet  1  . Polyethyl Glycol-Propyl Glycol (SYSTANE) 0.4-0.3 % SOLN Apply to eye.       No current facility-administered medications on file prior to visit.    ROS:  Out of a complete 14 system review of symptoms, the patient complains only of the following symptoms, and all other reviewed systems are negative.  Blackout Easy bruising Feeling hot, cold Dizziness  Blood pressure 118/68, pulse 77, height 5' 2"  (1.575 m), weight 171 lb (77.565 kg).  Physical Exam  General: The patient is alert and cooperative at the time of the examination. The patient is moderately obese.  Skin: No significant peripheral edema is noted.   Neurologic Exam  Mental status: Mini-Mental status examination done today shows a total score 26/30. The patient is able to name 8 animals in 60 seconds.  Cranial nerves: Facial symmetry is present. Speech is normal, no aphasia or dysarthria is noted. Extraocular movements are full. Visual fields are  full.  Motor: The patient has good strength in all 4 extremities.  Coordination: The patient has good finger-nose-finger and heel-to-shin bilaterally.  Gait and station: The patient has a slightly wide-based gait. Tandem gait is very minimally unsteady. Romberg is negative. No drift is seen.  Reflexes: Deep tendon reflexes are symmetric.   Assessment/Plan:  1. Near syncopal event  2. Memory disturbance  3. Mild gait disturbance  The patient had a near syncopal that occurred shortly after standing from a seated position. The patient had no palpitations of the heart. The etiology of the event is unclear. The patient will be followed conservatively at this time. The patient will contact our office if the episodes recur. A CardioNet monitor may be indicated at that point. The patient will be followed for her memory problems. The patient indicates that her memory is normal at this time. The patient will followup in about 6 months.  Jill Alexanders MD 04/16/2012 8:09 PM  [facility] [address] [place], [place] [address]  Phone [phone] Fax [phone]

## 2012-05-12 DIAGNOSIS — L538 Other specified erythematous conditions: Secondary | ICD-10-CM | POA: Diagnosis not present

## 2012-05-12 DIAGNOSIS — D239 Other benign neoplasm of skin, unspecified: Secondary | ICD-10-CM | POA: Diagnosis not present

## 2012-05-12 DIAGNOSIS — A499 Bacterial infection, unspecified: Secondary | ICD-10-CM | POA: Diagnosis not present

## 2012-05-27 DIAGNOSIS — K5010 Crohn's disease of large intestine without complications: Secondary | ICD-10-CM | POA: Diagnosis not present

## 2012-06-03 DIAGNOSIS — H04129 Dry eye syndrome of unspecified lacrimal gland: Secondary | ICD-10-CM | POA: Diagnosis not present

## 2012-06-08 DIAGNOSIS — H00029 Hordeolum internum unspecified eye, unspecified eyelid: Secondary | ICD-10-CM | POA: Diagnosis not present

## 2012-06-08 DIAGNOSIS — H02059 Trichiasis without entropian unspecified eye, unspecified eyelid: Secondary | ICD-10-CM | POA: Diagnosis not present

## 2012-06-08 DIAGNOSIS — Z9889 Other specified postprocedural states: Secondary | ICD-10-CM | POA: Diagnosis not present

## 2012-06-10 ENCOUNTER — Ambulatory Visit (INDEPENDENT_AMBULATORY_CARE_PROVIDER_SITE_OTHER): Payer: Medicare Other | Admitting: Pulmonary Disease

## 2012-06-10 ENCOUNTER — Encounter: Payer: Self-pay | Admitting: Pulmonary Disease

## 2012-06-10 VITALS — BP 150/70 | HR 74 | Temp 98.8°F | Ht 61.0 in | Wt 175.0 lb

## 2012-06-10 DIAGNOSIS — Z862 Personal history of diseases of the blood and blood-forming organs and certain disorders involving the immune mechanism: Secondary | ICD-10-CM

## 2012-06-10 DIAGNOSIS — K219 Gastro-esophageal reflux disease without esophagitis: Secondary | ICD-10-CM

## 2012-06-10 DIAGNOSIS — I2510 Atherosclerotic heart disease of native coronary artery without angina pectoris: Secondary | ICD-10-CM

## 2012-06-10 DIAGNOSIS — K5730 Diverticulosis of large intestine without perforation or abscess without bleeding: Secondary | ICD-10-CM

## 2012-06-10 DIAGNOSIS — R55 Syncope and collapse: Secondary | ICD-10-CM

## 2012-06-10 DIAGNOSIS — G2581 Restless legs syndrome: Secondary | ICD-10-CM

## 2012-06-10 DIAGNOSIS — M545 Low back pain, unspecified: Secondary | ICD-10-CM

## 2012-06-10 DIAGNOSIS — M949 Disorder of cartilage, unspecified: Secondary | ICD-10-CM

## 2012-06-10 DIAGNOSIS — E669 Obesity, unspecified: Secondary | ICD-10-CM

## 2012-06-10 DIAGNOSIS — I4892 Unspecified atrial flutter: Secondary | ICD-10-CM

## 2012-06-10 DIAGNOSIS — Z8639 Personal history of other endocrine, nutritional and metabolic disease: Secondary | ICD-10-CM

## 2012-06-10 DIAGNOSIS — E039 Hypothyroidism, unspecified: Secondary | ICD-10-CM

## 2012-06-10 DIAGNOSIS — M899 Disorder of bone, unspecified: Secondary | ICD-10-CM

## 2012-06-10 DIAGNOSIS — I10 Essential (primary) hypertension: Secondary | ICD-10-CM

## 2012-06-10 DIAGNOSIS — IMO0001 Reserved for inherently not codable concepts without codable children: Secondary | ICD-10-CM

## 2012-06-10 DIAGNOSIS — I679 Cerebrovascular disease, unspecified: Secondary | ICD-10-CM | POA: Diagnosis not present

## 2012-06-10 DIAGNOSIS — E785 Hyperlipidemia, unspecified: Secondary | ICD-10-CM

## 2012-06-10 NOTE — Patient Instructions (Addendum)
Today we updated your med list in our EPIC system...    Continue your current medications the same...  We decided to STOP the [facility]  for now and recheck your Lipid profile in 1 month...    We will contact you w/ the results when available...   Call for any questions...  Let's plan a follow up visit in 71mo, sooner if needed for problems.Marland KitchenMarland Kitchen

## 2012-06-10 NOTE — Progress Notes (Signed)
Subjective:    Patient ID: [name], female    DOB: 12/15/1932, 79 y.o.   MRN: [id number]  HPI 79 y/o WF here for a follow up visit... she has mult med problems and meds as noted below >> See prev notes for earlier entries...  ~  December 07, 2010:  22moROV & she had recent antibiotic Rx for sinusitis- now improved w/ clear drainage only; otherwise stable & f/u TSH today, still on 829m/d dose Synthroid is wnl at 2.63...    She saw DrJHayes for GI 9/12> hx Crohn's colitis, loose stools, gas, etc; treated w/ Lialda 3.6gm/d along w/ Questran daily; stable- continue same + probiotic...    [facility] note reviewed...  ~  Jun 07, 2011:  34m89moV & [name] is c/o some LBP- eval by DrNShara Blazing13 & "it's mostly arthritis" she has [facility] relief;  She stays active caring for her 46m57mo grand daughter; she's been under incr stress w/ husb undergoing AVR & CABG by DrOwens (doing well now)...    She continues to be followed [facility] DrShockley [facility] & DrMurray here (XRT) for her Merkel cell tumor left lower lid w/ mult surgeries (Mohs, the reconstruction), but she notes lots of tearing, photosens, etc...     We reviewed prob list, meds, xrays and labs> see below>> BMD here 12/12 showed TScores+0.8 in spine (osteophytes & scoliosis), and -2.1 in left [facility] 2/13 showed mild scoliosis & degen changes, vasc calcif & GB surg clips... Nuclear Bone Scan by DrNiShara Blazing3 showed diffuse degenerative uptake, no fracture or bone lesions... LABS 6/13:  FLP- at goals on Lip40;  Chems- wnl;  CBC- wnl;  TSH=3.10;  VitD=47;  Sed=23   ~  December 11, 2011:  34mo 8mo& [name] has had a good interval- her CC= Insomnia but she doesn't want additional medication... We reviewed the following medical problems during today's office visit>> she gets her medical care by committee w/ mult medical specialists >>     Merkel Cell tumor> [facility], subseq XRT by DrMurray at  [facility]  [facility]> Hx MRSA infection & abscess drained by DrBates; she continues on Rx w/ [name], Flonase, Mucinex, Meclizine...    Hx recurrent bronchitis & Abn CXR> 2cm left apical fluid density- ?nerve sheath tumor on prev CXRs (subtle medial left apical abn) and CTscans (easily seen & no change serially);  CXR today shows norm heart size, clear lungs, no change in subtle medial left apical extrapleural lesion...    HBP> on Coreg25Bid; BP= 124/60 & she denies CP, palpit, ch in SOB, edema, etc...    CAD> on ASA81; Hx non-obstructive dis w/ cath 2006 showing 30%LAD lesion & good LVF; followed by DrBensimhon/ Hochrein for Cards...    Hx AFlutter> s/p catheter ablation in 2005 by DrKlein and no known recurrence...    Cerebrovasc dis> on ASA81; known 80% left MCA stenosis (seen last on CTA Head 5/09) & she denies cerebral ischemic symptoms; followed by DrWillis...    Hyperlipid> on Lip40; last FLP 6/13 showed TChol 142, TG 93, HDL 79, LDL 45    Hypothyroid, Hx of Hyperpara> on Synthroid88; followed by DrBalan (we do not have notes from her); last TSH=3.10 Jun'13; last Ca=9.4 Jun'13- she had right inferior parathyroid gland resected 11/07 by DrGerkin...Marland KitchenMarland Kitchen  Obesity> wt= 179# which is down 3# over the last 8mo we reviewed diet, exercise, & wt reduction strategies...    GI- GERD, Divertics, IBS, Polyps, Crohn's> on Lialda, Questran, Immodium, Probiotic, Simethacone; [facility]- she denies abd discomfort, dysphagia, n/v, c/d, blood seen...    Rheum- FM> on Tramadol prn; followed by DrDeveshwar...    Osteopenia> on calcium, MVI, VitD2000; last VitD level = 47 Jun'13    HAs, RLS> on Mirapex0.552md; seen by DrDohmeier...    Hx Psychosis> followed by DrPlovsky, not currently on medication... We reviewed prob list, meds, xrays and labs> see below for updates  >> she had the 2013 flu vaccine in Aug'13; she brought a 4page med hx summary report... CXR 12/13 is clear- normal heart size, clear lungs- no change in subtle medial left apical lesion better delineated on old CTscans...  ~  January 15, 2012:  41m441moV & add-on appt at pt's request due to memory issues> she brings a letter from her daughter to MrSnow about mother's memory, but she denies signif problem; she called DrWillis' office to be seen (he sees her for cerebrovasc dis, known 80% left MCA stenosis on CTA from 5/09) but was told they would not see her for Memory eval without a referral; she called here & was worked into the schedule so that we could evaluate her in advance of the desired neurology referral...    She also notes 2wk hx daily left temple region headaches w/ some tenderness noted; she rates the discomfort 1-2/10 & says it doesn't really hurt unless she touches the area; we are checking labs including Sed Rate...    MMSE 01/15/12 showed the pt to be oriented to person, place & time- Date= 1/?/2014;  Pres=Obama, VP=?? "I hate politics", Gov=??;  Recall- 3/3 immed but only 1/3 after distraction;  Serial 7's= [phone] backwards= DLROW; Proverbs were mixed in their interpretation...  She does not want to start any medications or get screening scans at this time, just requests referral to DrWillis which we will gladly arrange... LABS 1/14:  Chems- wnl;  CBC- wnl;  TSH=9.38 on Levothy88;  Sed=13 Thyroid dose increased to Levothy100m45m...   ~  June 10, 2012:  81mo 34mo& [name] reports that her home BP checks have been great, but she really doesn't want to take the Lipitor & wonders if she can leave it off for now;  She reports that she is taking a number of supplements recommended by [name] & she is feeling really good- husband too...  She had a Neuro eval by drWillis 4/14- Epic note reviewed> children concerned about her memory (pt denies prob- but MMSE was 26/30 &  animal fluency just 9 in 60sec), pt c/o chronic insomnia & fatigue, had 1 episode of dizziness/ near syncope (?postural episode); they recommended conservative approach & careful follow up...     Merkel Cell tumor> [facility], subseq XRT by [facility]Murray at  [facility]...    ENT> Hx MRSA infection & abscess drained by DrBates in 2010; she continues on Rx w/ [name], Flonase, Mucinex, Meclizine...    Hx recurrent bronchitis & Abn CXR> 2cm left apical fluid density- ?nerve sheath tumor on prev CXRs (subtle medial left apical abn) and CTscans (easily seen & no change serially);  CXR today shows norm heart size, clear lungs, no change in subtle medial left  apical extrapleural lesion...    HBP> on Coreg25Bid; BP= 150/70 & she denies CP, palpit, ch in SOB, edema, etc...    CAD> on ASA81; Hx non-obstructive dis w/ cath 2006 showing 30%LAD lesion & good LVF; followed by DrBensimhon/ Hochrein for Cards...    Hx AFlutter> s/p catheter ablation in 2005 by DrKlein and no known recurrence...    Cerebrovasc dis> on ASA81; known 80% left MCA stenosis (seen last on CTA Head 5/09) & she denies cerebral ischemic symptoms; followed by DrWillis...    Hyperlipid> on Lip40; last FLP 6/13 showed TChol 142, TG 93, HDL 79, LDL 45; she wants to stop the Lipitor & take the alternative supplements from [employer] [facility] alternative store!!!     Hypothyroid, Hx of Hyperpara> on Synthroid100; followed by DrBalan (we do not have notes from her); last TSH=3.10 Jun'13; last Ca=9.4 Jun'13- she had right inferior parathyroid gland resected 11/07 by DrGerkin...    Overweight> wt= 175# which is down 3# over the last 32mo we reviewed diet, exercise, & wt reduction strategies...    GI- GERD, Divertics, IBS, Polyps, Crohn's> on Lialda, Questran, Immodium, Probiotic, Simethacone; [facility]- she denies abd discomfort,  dysphagia, n/v, c/d, blood seen...    Rheum- FM> on Tramadol prn; followed by DrDeveshwar...    Osteopenia> on calcium, MVI, VitD2000; last VitD level = 47 Jun'13    Dementia> she has early memory impairment noted by Family, she is followed by DrWillis for Neuro- MMSE was 26/30 & animal fluency just 9 in 60sec, she does not realize there is a problem...    HAs, RLS> on Mirapex0.569md; seen by DrDohmeier...    Hx Psychosis> followed by DrPlovsky, not currently on medication... 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 ~  She continues periodic follow up [facility] ([facility] Dr[facility]owler ([facility] DrMurray ([facility])... ~  9/13: [facility] exposure keratopathy  Hx of ENT Problem>  left nasal soft tissue abscess drained by DrBates- MRSA treated w/ drainage, IV antibiotics, then Doxy... she uses Claritin & Flonase for sinuses.  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  ~  [facility] & [facility], calcif AoV, left lung apex extra-pleural mass ~2cm, 51m2mUL nodule, scat ground-glass opac, atelec, marked biliary ductal dil- consider MRCP... (Old films and CT Chest here 6/07 & 10/09 compared- not mentioned on 2007 scan but the 2009 scan noted lesion left apex 2.6 x 2.0cm <sl larger than 2007 scan in retrospect> ?neurogenic/ nerve sheath tumor). ~  Lesion not mentioned on routine CXRs> CXR 5/12 norm heart, clear lungs, NAD... Marland Kitchen  CXR 12/13 is clear- normal heart size, clear lungs- no change in subtle medial left apical lesion better delineated on old  CTscans...  OBSTRUCTIVE SLEEP APNEA  - Hx mild OSA on Sleep Study by DrDohmeier... she never really used the CPAP and states that "now I'm fine off of it"... she does have some insomnia & uses BENEDRYL Prn.  HYPERTENSION (ICD-401.9) - controlled on COREG 72m43m...  ~  11/12:  BP=122/60 and pressures  are good at home etc;  denies HA, visual changes, CP, palipit, dizziness, dyspnea, edema, etc; she has chronic fatigue symptoms. ~  5/13:  BP= 124/68 & she remains largely asymptomatic...  CORONARY ARTERY DISEASE (ICD-414.00) - on COREG 27mBid, & supposed to be on ASA 846md... non-obstructive disease w/ cath 9/06 showing 30% LAD lesion, good LVF... neg Myoview 3/06 no ischemia, EF=73%... she was prev followed by DrBensimhon for Cards. ~  10/13: she saw DrHochrein- HBP, CAD, risk factors, hx AFlutter- s/p ablation; EKG showed NSR, rate66, wnl, NAD; doing well w/o CV complaints, not exercising & advised to incr exercise program...  Hx of ATRIAL FLUTTER (ICD-427.32) - S/P catheter ablation in 2005 by DrKlein.  CEREBROVASCULAR DISEASE (ICD-437.9) - eval by DrWillis 5/09 and DrBritz (Neurosurg @ [facility]) confirms 80% left middle cerebral art stenosis which is felt to be asymptomatic... they advise ASA 8125m, and secondary risk factor modification...  HYPERLIPIDEMIA (ICD-272.4) - now on LIPITOR 25m25m+ FISH OIL Bid...  ~  FLP [place]08 on Vytor10/80 showed TChol 130, TG 139, HDL 38, LDL 64... ~  FLP [place]9 on Vytor10/80 showed TChol 138, TG 102, HDL 62, LDL 56... ~  [place] DrBensimhon started Lipitor 40/d. ~  FLP 6/10 on Lip40 showed TChol 148, TG 94, HDL 67, LDL 62 ~  FLP 6/11 on Lip40 showed TChol 186, TG 76, HDL 82, LDL 88 ~  FLP 5/12 on Lip40 showed TChol 156, TG 110, HDL 81, LDL 53 ~  FLP 6/13 on Lip40 showed TChol 142, TG 93, HDL 79, LDL 45 ~  6/14:  She wants to stop the Lipitor 7 take natural supplements from [employer] health food  store...  OBESITY (ICD-278.00) - she notes that her fibromyalgia makes it difficult for her to exercise. ~  weight 8/09 = 221# ~  weight 12/09 = 197# ~  weight 4/10 = 169#... great job w/ diet program. ~  weight 6/11 = 173# ~  weight 11/11 = 190#... we discussed diet + exercise. ~  Weight 5/12 = 189# ~  Weight 5/13 = 182# ~  Weight 6/14 = 175#       << GI - Prev MANAGED BY DrJHAYES ("He released me")>> GASTROESOPHAGEAL REFLUX DISEASE (ICD-530.81) - followed by DrJHayes for GI on NEXIUM 40Bid... she has GERD, Hx gastric ulcer. DIVERTICULOSIS OF COLON (ICD-562.10) - hx diverticulitis in past... She takes PROBIOTIC & SIMETHACONE... IRRITABLE BOWEL SYNDROME (ICD-564.1) - DrHayes' eval w/ colon reported to show "ulcers" c/w Crohn's dis... COLONIC POLYPS - Hx of divertics w/ diverticulitis in the past and IBS, and hx of Colon Polyps w/ prev colonoscopy in 4/06... [facility]/ ?[facility]'s... CROHN'S DISEASE (ICD-555.9) - on LIALDA 1.2gm- 3 tabs daily, QUESTRAN 2 scoops daily, & IMMODIUM Prn...       << ENDOCRINE - Prev MANAGED BY DrBALAN >> HYPERPARATHYROIDISM, HX OF (ICD-V12.2) - she had hypercalcemia w/ right inferior parathyroid gland resected 11/07 by DrGerkin... HYPOTHYROIDISM (ICD-244.9) - on SYNTHROID 88mc50mily... prev followed by DrBalan. ~  labs 6/07 showed TSH= 5.16... managed by DrBalan at that time. ~  labs here 7/10 on Levothy112 showed TSH= 0.16... rec> decr to 1/2 tab & f/u lab. ~  labs here 10/10 showed TSH= 10.04... rec> change to 75mcg19m~  labs here 6/11 on Levothy75 showed TSH= 6.84... rec> step up to 88mcg/37m  Labs 5/12 on Levothy88 showed TSH= 13.14... rec to incr to  163mg/d (she never did)... ~  Labs 11/12 on Levo88 showed TSH= 2.63... Continue same. ~  Labs 5/13 on Levo88 showed TSH= 3.10 ~  Labs 1/14 on Levo88 showed TSH= 9.38 & we will incr to Levothy1059m/d...       << RHEUM - MAMAGED BY DrDEVESHWAR >> FIBROMYALGIA  (ICD-729.1) - followed by DrDeveshwar... she uses TRAMADOL & LIDODERM patches for pain. OSTEOPENIA (ICD-733.90) - on FOSAMAX 7054mk, ca++, vits... prev followed by DrBalan... ~  last BMD 9/08 showed TScores -0.8 in Spine & -2.3 in right FemNeck... ~  f/u BMD 10/10 showed TScores invalid in Spine, & -2.2 in right FemNeck... continue Rx. ~  BMD here 12/12 showed TScores+0.8 in spine (osteophytes & scoliosis), and -2.1 in left FemNeck... Continue same. ~  Nuclear Bone Scan 2/13 showed areas of degenerative uptake in shoulders, sternoclavic joints, lower lum spine, bilat hips & knees & feet... ~  2/13: XRay Lumbar spine showed mild scoliosis, mod degen changes at L3-4 & L4-5, vasc calcif also seen...  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  SYNCOPE (ICD-780.2) - [place]... [place]... ~ syncopal spell "altered level of consciusness" she remembers being on the plane and next in the ER...  their DC note indicates poss seizure but nothing to substantiate this (no seiz activ noted by anyone)...  ~  MRI Brain 2009 was neg x  max sinusitis... MRA however showed 80% stenosis in the left MCA... ~  CDoppler 2009 showed mild plaque, norm waveforms, no signif stenoses... ~  2DEcho 2009 showed basically WNL, borderline conc LVH, EF=55-60%... ~  Eval by DrWillis for Memory & brief dizzy spell/ near syncope 2014> Epic note reviewed> children concerned about her memory (pt denies prob- but MMSE was 26/30 & [place] flu[place] just 9 [place] chronic insomnia & fatigue, had 1 episode of dizziness/ near syncope (?postural episode); they recommended conservative approach & careful follow up... ~  MRI Brain 2/14 by [facility] showed mild chronic microvasc ischemia, small developmental venous anomaly in left frontal  area, no infarcts, no lesions...   RESTLESS LEG SYNDROME (ICD-333.94) - she's been on MIRAPEX 0.5mg19m... ~  10/10: c/o incr symptoms and instructed to incr Mirapex to 2 tabs...  PSYCHOSIS (ICD-298.9) - this was attributed to Prednisone therapy for the Crohn's dis... [place] and changed by Psychiatrists... [facility] he is off the prev Depakote & Keppra... & she is also off the prev Zoloft & Restoril...   Past Surgical History  Procedure Laterality Date  . Parathyroid adenoma surgery    . Cholecystectomy    . Moh';s surg left lower eye lid surgery and lid reconstruction surgery  2/10  . Bladder suspension    . Cataract extraction Bilateral   . Appendectomy    . Tonsillectomy and adenoidectomy    . Lumbar laminectomy    . Incision and drainage perirectal abscess    . Heel spur excision    . Nasal sinus surgery Right     Right maxillary  . Eye lid surgery  04/04/2011    [facility] by Dr. Amy Norlene [facility]uelOutpatient Encounter Prescriptions as of 06/10/2012  Medication Sig Dispense Refill  . aspirin 81 MG tablet Take  81 mg by mouth daily.        Marland Kitchen Bioflavonoid Products (COMPLEX C SR PO) Take 1 tablet by mouth daily.      . Biotin 5000 MCG TABS Take 1 tablet by mouth daily.        . Calcium-Magnesium 500-250 MG TABS Take 2 tablets by mouth daily.      . carvedilol (COREG) 25 MG tablet Take 1 tablet (25 mg total) by mouth 2 (two) times daily with a meal.  180 tablet  1  . Cholecalciferol (VITAMIN D3) 2000 UNITS TABS Take 1 tablet by mouth daily.      . cholestyramine (QUESTRAN) 4 GM/DOSE powder 2 scoops per day       . clotrimazole-betamethasone (LOTRISONE) cream Apply topically 2 (two) times daily. As directed  30 g  6  . co-enzyme Q-10 50 MG capsule Take 50 mg by mouth daily.      . Evening Primrose Oil 500 MG CAPS 1 tablet twice a day      . fexofenadine (ALLEGRA) 30 MG tablet Take 30 mg by mouth daily.      . fish oil-omega-3 fatty  acids 1000 MG capsule 3 capsules daily      . fluticasone (FLONASE) 50 MCG/ACT nasal spray 2 sprays by Nasal route daily.        Marland Kitchen GARCINIA CAMBOGIA-CHROMIUM PO Take 1 tablet by mouth daily.      Marland Kitchen guaifenesin (HUMIBID E) 400 MG TABS 1 TABLET BY MOUTH TWO TIMES DAILY      . levothyroxine (SYNTHROID, LEVOTHROID) 100 MCG tablet Take 88 mcg by mouth daily.      Marland Kitchen lidocaine (LIDODERM) 5 % Apply as directed  30 patch  0  . LIPITOR 40 MG tablet TAKE 1 TABLET BY MOUTH AT BEDTIME  30 tablet  2  . loperamide (IMODIUM A-D) 2 MG tablet As needed      . meclizine (ANTIVERT) 25 MG tablet Take 1 tablet (25 mg total) by mouth 3 (three) times daily as needed. Take 1 tablet every 6 hours as needed  30 tablet  5  . mesalamine (LIALDA) 1.2 G EC tablet Take 3 tablets by mouth once a day as directed by Dr. Amedeo Plenty  270 tablet  1  . Misc Natural Products (TART CHERRY ADVANCED) CAPS Take 1 capsule by mouth daily.      . Multiple Vitamin (MULTIVITAMIN PO) Once a day       . Polyethyl Glycol-Propyl Glycol (SYSTANE) 0.4-0.3 % SOLN Apply to eye.      . pramipexole (MIRAPEX) 0.5 MG tablet Take 1 tablet (0.5 mg total) by mouth 2 (two) times daily.  180 tablet  3  . PROBIOTIC CAPS Take 1 capsule by mouth daily.      . Simethicone (PHAZYME) 180 MG CAPS 3 times a day       . traMADol (ULTRAM) 50 MG tablet Take 1 tablet (50 mg total) by mouth every 8 (eight) hours as needed for pain.  270 tablet  1  . vitamin C (ASCORBIC ACID) 500 MG tablet Take 500 mg by mouth daily.      . [DISCONTINUED] Glycerin-Polysorbate 80 (REFRESH DRY EYE THERAPY) 1-1 % SOLN Apply 1 drop to eye 4 (four) times daily.       No facility-administered encounter medications on file as of 06/10/2012.    Allergies  Allergen Reactions  . Bacitracin     REACTION: redness, rash, severe itching  . Carbamazepine  Intolerant to Tegretol.  REACTION: dizziness, blurred vision, nausea, HA, insomnia  . Codeine     REACTION: nause and vomiting  . Diclofenac  Sodium     REACTION: severe headache  . Fentanyl     REACTION: chest pain, HBP, with second patch experienced vertigo and nausea  . Fluoxetine Hcl     REACTION: dizziness, blurred vision, nausea, HA, insomnia  . Hydromorphone Hcl     REACTION: intolerant to dilaudid with nausea, vomiting  . Indomethacin     REACTION: dizziness, blurred vision, nausea, HA, insomnia  . Lisinopril     REACTION: cough  . Meperidine Hcl     REACTION: intolerant to demerol with nausea, vomiting  . Morphine     REACTION: n/v  . Prednisone     REACTION: pt had psychiatric episode w/ hosp - lost memory x3weeks  . Pregabalin     REACTION: severe restless legs, insomnia  . Propranolol Hcl     Intolerant to Inderal.  REACTION: dizziness, blurred vision, nausea, HA, insomnia  . Refresh Lacri-Lube (Artificial Tears)     Caused and allergic reaction with redness, hard crust on eyes  . Ropinirole Hcl     REACTION: severe restless legs and insomnia  . Sulfamethoxazole     REACTION: nausea, gas, loose stools    Current Medications, Allergies, Past Medical History, Past Surgical History, Family History, and Social History were reviewed in [employer] record.     Review of Systems        See HPI - all other systems neg except as noted... The patient complains of weight gain, dyspnea on exertion, and muscle weakness.  The patient denies anorexia, fever, weight loss, vision loss, decreased hearing, hoarseness, chest pain, syncope, peripheral edema, prolonged cough, headaches, hemoptysis, abdominal pain, melena, hematochezia, severe indigestion/heartburn, hematuria, incontinence, suspicious skin lesions, transient blindness, difficulty walking, depression, unusual weight change, abnormal bleeding, enlarged lymph nodes, angioedema, and breast masses.    Objective:   Physical Exam     WD, Overweight, 79 y/o WF in NAD...  GENERAL:  Alert & oriented; pleasant & cooperative... HEENT:  [place]/AT,  EOM-wnl, EACs-clear, TMs-wnl, NOSE-clear, THROAT-clear & wnl.  << s/p mult surg left lower lid- sl red, improved... NECK:  Supple w/ fairROM; no JVD; normal carotid impulses w/o bruits; no thyromegaly or nodules palpated; no lymphadenopathy. CHEST:  Clear to P & A; without wheezes/ rales/ or rhonchi. HEART:  Regular Rhythm; gr 1/6 SEM without rubs/ or gallops. ABDOMEN:  Obese soft & nontender; normal bowel sounds; no organomegaly or masses detected. EXT: without deformities, mild arthritic changes; no varicose veins/ +venous insuffic/ no edema... NEURO:  CN's intact; no focal neuro deficits... DERM:  No lesions noted; no rash etc...  RADIOLOGY DATA:  Reviewed in the EPIC EMR & discussed w/ the patient...  LABORATORY DATA:  Reviewed in the EPIC EMR & discussed w/ the patient...   Assessment & Plan:    MEMORY>> Abbreviated MMSE shows early but definite lapses in responses & she requests that we set up the referral to DrWillis=> MMSE was 26/30 & animal fluency just 9 in 60sec, he has not yet started medication.   Ophthalmology>  [facility] by DrFowler w/ numerous surg procedures for her Merkel cell tumor of the left lower eyelid...  HBP>  Controlled on the Coreg but needs better low sodium wt reducing diet...  Cardiac> CAD, Hx AFlutter s/p ablation>  Stable on ASA + the Coreg, continue same...  HYPERLIPID>  Stable on the Lip40=> but sher wants to switch to natural alternative supplements!!!  Obesity> she has been somewhat successful losing the wt, we reviewed wt reducing diet + exercise...  GI>  Followed by [facility] as noted for her Crohn's etc & she has been doing well, "he released me" she says...  HYPOTHYROID>  TSH is elev at 9.38 on the Synthroid88 (we will confirm taking it daily) & incr dose to 136mg/d & [facility]  Rheum>  FM/ osteopenia>  Followed by DLadene Artistfor Rheum, continue her regimen...   Patient's Medications  New Prescriptions    No medications on file  Previous Medications   ASPIRIN 81 MG TABLET    Take 81 mg by mouth daily.     BIOFLAVONOID PRODUCTS (COMPLEX C SR PO)    Take 1 tablet by mouth daily.   BIOTIN 5000 MCG TABS    Take 1 tablet by mouth daily.     CALCIUM-MAGNESIUM 500-250 MG TABS    Take 2 tablets by mouth daily.   CARVEDILOL (COREG) 25 MG TABLET    Take 1 tablet (25 mg total) by mouth 2 (two) times daily with a meal.   CHOLECALCIFEROL (VITAMIN D3) 2000 UNITS TABS    Take 1 tablet by mouth daily.   CHOLESTYRAMINE (QUESTRAN) 4 GM/DOSE POWDER    2 scoops per day    CLOTRIMAZOLE-BETAMETHASONE (LOTRISONE) CREAM    Apply topically 2 (two) times daily. As directed   CO-ENZYME Q-10 50 MG CAPSULE    Take 50 mg by mouth daily.   EVENING PRIMROSE OIL 500 MG CAPS    1 tablet twice a day   FEXOFENADINE (ALLEGRA) 30 MG TABLET    Take 30 mg by mouth daily.   FISH OIL-OMEGA-3 FATTY ACIDS 1000 MG CAPSULE    3 capsules daily   FLUTICASONE (FLONASE) 50 MCG/ACT NASAL SPRAY    2 sprays by Nasal route daily.     GARCINIA CAMBOGIA-CHROMIUM PO    Take 1 tablet by mouth daily.   GUAIFENESIN (HUMIBID E) 400 MG TABS    1 TABLET BY MOUTH TWO TIMES DAILY   LEVOTHYROXINE (SYNTHROID, LEVOTHROID) 100 MCG TABLET    Take 88 mcg by mouth daily.   LIDOCAINE (LIDODERM) 5 %    Apply as directed   LOPERAMIDE (IMODIUM A-D) 2 MG TABLET    As needed   MECLIZINE (ANTIVERT) 25 MG TABLET    Take 1 tablet (25 mg total) by mouth 3 (three) times daily as needed. Take 1 tablet every 6 hours as needed   MESALAMINE (LIALDA) 1.2 G EC TABLET    Take 3 tablets by mouth once a day as directed by Dr. HAmedeo Plenty  MISC NATURAL PRODUCTS (TART CHERRY ADVANCED) CAPS    Take 1 capsule by mouth daily.   MULTIPLE VITAMIN (MULTIVITAMIN PO)    Once a day    POLYETHYL GLYCOL-PROPYL GLYCOL (SYSTANE) 0.4-0.3 % SOLN    Apply to eye.   PRAMIPEXOLE (MIRAPEX) 0.5 MG TABLET    Take 1 tablet (0.5 mg total) by mouth 2 (two) times daily.   PROBIOTIC CAPS    Take 1 capsule by  mouth daily.   SIMETHICONE (PHAZYME) 180 MG CAPS    3 times a day    TRAMADOL (ULTRAM) 50 MG TABLET    Take 1 tablet (50 mg total) by mouth every 8 (eight) hours as needed for pain.   VITAMIN C (ASCORBIC ACID) 500 MG TABLET    Take  500 mg by mouth daily.  Modified Medications   No medications on file  Discontinued Medications   GLYCERIN-POLYSORBATE 80 (REFRESH DRY EYE THERAPY) 1-1 % SOLN    Apply 1 drop to eye 4 (four) times daily.   LIPITOR 40 MG TABLET    TAKE 1 TABLET BY MOUTH AT BEDTIME

## 2012-07-13 ENCOUNTER — Other Ambulatory Visit (INDEPENDENT_AMBULATORY_CARE_PROVIDER_SITE_OTHER): Payer: Medicare Other

## 2012-07-13 DIAGNOSIS — Z862 Personal history of diseases of the blood and blood-forming organs and certain disorders involving the immune mechanism: Secondary | ICD-10-CM

## 2012-07-13 DIAGNOSIS — E785 Hyperlipidemia, unspecified: Secondary | ICD-10-CM

## 2012-07-13 DIAGNOSIS — Z8639 Personal history of other endocrine, nutritional and metabolic disease: Secondary | ICD-10-CM

## 2012-07-13 LAB — LIPID PANEL
Cholesterol: 275 mg/dL — ABNORMAL HIGH (ref 0–200)
Triglycerides: 113 mg/dL (ref 0.0–149.0)

## 2012-07-13 LAB — LDL CHOLESTEROL, DIRECT: Direct LDL: 188.6 mg/dL

## 2012-07-15 ENCOUNTER — Other Ambulatory Visit: Payer: Self-pay | Admitting: Pulmonary Disease

## 2012-07-15 MED ORDER — ATORVASTATIN CALCIUM 40 MG PO TABS: 40.0000 mg | ORAL_TABLET | Freq: Every day | ORAL | Status: DC

## 2012-08-10 DIAGNOSIS — Z13820 Encounter for screening for osteoporosis: Secondary | ICD-10-CM | POA: Diagnosis not present

## 2012-08-10 DIAGNOSIS — Z01419 Encounter for gynecological examination (general) (routine) without abnormal findings: Secondary | ICD-10-CM | POA: Diagnosis not present

## 2012-08-10 DIAGNOSIS — M949 Disorder of cartilage, unspecified: Secondary | ICD-10-CM | POA: Diagnosis not present

## 2012-08-10 DIAGNOSIS — Z1231 Encounter for screening mammogram for malignant neoplasm of breast: Secondary | ICD-10-CM | POA: Diagnosis not present

## 2012-08-12 ENCOUNTER — Encounter: Payer: Self-pay | Admitting: Neurology

## 2012-08-12 ENCOUNTER — Ambulatory Visit (INDEPENDENT_AMBULATORY_CARE_PROVIDER_SITE_OTHER): Payer: Medicare Other | Admitting: Neurology

## 2012-08-12 VITALS — BP 136/57 | HR 66 | Ht 62.0 in | Wt 172.0 lb

## 2012-08-12 DIAGNOSIS — G4733 Obstructive sleep apnea (adult) (pediatric): Secondary | ICD-10-CM | POA: Diagnosis not present

## 2012-08-12 DIAGNOSIS — R413 Other amnesia: Secondary | ICD-10-CM | POA: Diagnosis not present

## 2012-08-12 MED ORDER — DONEPEZIL HCL 5 MG PO TABS: 5.0000 mg | ORAL_TABLET | Freq: Every day | ORAL | Status: DC

## 2012-08-12 NOTE — Progress Notes (Signed)
Reason for visit: Memory disturbance  [name] is an 79 y.o. female  History of present illness:  [name] is a 79 year old right-handed white female with a history of a progressive memory disturbance that has been present for about a year. The patient has not noted any significant cognitive changes since last seen. The patient does not operate a motor vehicle. The patient overall has been doing well. The patient does not sleep well at night, but she generally has good energy levels during the day. The patient is interested in getting on a medication to help slow down progression of memory. MRI evaluation done previously shows minimal small vessel disease, and blood work was unremarkable.  Past Medical History  Diagnosis Date  . OSA (obstructive sleep apnea)   . History of bronchitis   . Other diseases of lung, not elsewhere classified   . Hypertension   . CAD (coronary artery disease)     LAD 30% 2006  . History of atrial flutter   . Cerebrovascular disease   . Hyperlipemia   . Obesity   . GERD (gastroesophageal reflux disease)   . Diverticulosis of colon   . IBS (irritable bowel syndrome)   . Colonic polyp   . Crohn's disease   . Hypothyroidism   . History of hyperparathyroidism   . Fibromyalgia   . Osteopenia   . History of headache   . Syncope   . Restless legs syndrome (RLS)   . Psychosis   . History of tuberculosis   . Radiation 05/2008    5000 cGy to left lower eyelid, parotid lymphatics and upper neck  . Memory disturbance   . Bipolar affective     History of psychosis  . Merkel cell tumor     Left eyelid    Past Surgical History  Procedure Laterality Date  . Parathyroid adenoma surgery    . Cholecystectomy    . Moh';s surg left lower eye lid surgery and lid reconstruction surgery  2/10  . Bladder suspension    . Cataract extraction Bilateral   . Appendectomy    . Tonsillectomy and adenoidectomy    . Lumbar laminectomy    . Incision and drainage  perirectal abscess    . Heel spur excision    . Nasal sinus surgery Right     Right maxillary  . Eye lid surgery  04/04/2011    [facility] by Dr. Mickie Kay    Family History  Problem Relation Age of Onset  . Colon cancer      grandmother  . Arthritis Mother   . Hypertension Mother   . Ulcers Mother   . Heart failure Father   . Pulmonary embolism      Social history:  reports that she quit smoking about 52 years ago. Her smoking use included Cigarettes. She smoked 0.00 packs per day for 6 years. She has never used smokeless tobacco. She reports that she does not drink alcohol or use illicit drugs.  Allergies:  Allergies  Allergen Reactions  . Bacitracin     REACTION: redness, rash, severe itching  . Carbamazepine     Intolerant to Tegretol.  REACTION: dizziness, blurred vision, nausea, HA, insomnia  . Codeine     REACTION: nause and vomiting  . Diclofenac Sodium     REACTION: severe headache  . Fentanyl     REACTION: chest pain, HBP, with second patch experienced vertigo and nausea  . Fluoxetine Hcl     REACTION: dizziness,  blurred vision, nausea, HA, insomnia  . Hydromorphone Hcl     REACTION: intolerant to dilaudid with nausea, vomiting  . Indomethacin     REACTION: dizziness, blurred vision, nausea, HA, insomnia  . Lisinopril     REACTION: cough  . Meperidine Hcl     REACTION: intolerant to demerol with nausea, vomiting  . Morphine     REACTION: n/v  . Prednisone     REACTION: pt had psychiatric episode w/ hosp - lost memory x3weeks  . Pregabalin     REACTION: severe restless legs, insomnia  . Propranolol Hcl     Intolerant to Inderal.  REACTION: dizziness, blurred vision, nausea, HA, insomnia  . Refresh Lacri-Lube (Artificial Tears)     Caused and allergic reaction with redness, hard crust on eyes  . Ropinirole Hcl     REACTION: severe restless legs and insomnia  . Sulfamethoxazole     REACTION: nausea, gas, loose stools    Medications:  Current Outpatient  Prescriptions on File Prior to Visit  Medication Sig Dispense Refill  . aspirin 81 MG tablet Take 81 mg by mouth daily.        Marland Kitchen atorvastatin (LIPITOR) 40 MG tablet Take 1 tablet (40 mg total) by mouth daily.  90 tablet  3  . Biotin 5000 MCG TABS Take 1 tablet by mouth daily.        . Calcium-Magnesium 500-250 MG TABS Take 2 tablets by mouth daily.      . Cholecalciferol (VITAMIN D3) 2000 UNITS TABS Take 1 tablet by mouth daily.      . cholestyramine (QUESTRAN) 4 GM/DOSE powder 2 scoops per day       . co-enzyme Q-10 50 MG capsule Take 50 mg by mouth daily.      . Evening Primrose Oil 500 MG CAPS 1 tablet twice a day      . fish oil-omega-3 fatty acids 1000 MG capsule 3 capsules daily      . fluticasone (FLONASE) 50 MCG/ACT nasal spray 2 sprays by Nasal route daily.        Marland Kitchen guaifenesin (HUMIBID E) 400 MG TABS 1 TABLET BY MOUTH TWO TIMES DAILY      . levothyroxine (SYNTHROID, LEVOTHROID) 100 MCG tablet Take 88 mcg by mouth daily.      Marland Kitchen lidocaine (LIDODERM) 5 % Apply as directed  30 patch  0  . loperamide (IMODIUM A-D) 2 MG tablet As needed      . meclizine (ANTIVERT) 25 MG tablet Take 1 tablet (25 mg total) by mouth 3 (three) times daily as needed. Take 1 tablet every 6 hours as needed  30 tablet  5  . mesalamine (LIALDA) 1.2 G EC tablet Take 3 tablets by mouth once a day as directed by Dr. Madilyn Fireman  270 tablet  1  . pramipexole (MIRAPEX) 0.5 MG tablet Take 1 tablet (0.5 mg total) by mouth 2 (two) times daily.  180 tablet  3  . PROBIOTIC CAPS Take 1 capsule by mouth daily.      . traMADol (ULTRAM) 50 MG tablet Take 1 tablet (50 mg total) by mouth every 8 (eight) hours as needed for pain.  270 tablet  1  . vitamin C (ASCORBIC ACID) 500 MG tablet Take 500 mg by mouth daily.       No current facility-administered medications on file prior to visit.    ROS:  Out of a complete 14 system review of symptoms, the patient complains only of the following  symptoms, and all other reviewed systems are  negative.  Easy bruising Restless legs Memory disturbance  Blood pressure 136/57, pulse 66, height 5\' 2"  (1.575 m), weight 172 lb (78.019 kg).  Physical Exam  General: The patient is alert and cooperative at the time of the examination.  Skin: No significant peripheral edema is noted.   Neurologic Exam  Mental status: Mini-Mental status examination done today shows a total score 23/30. The patient is able to name 11 animals in 60 seconds.   Cranial nerves: Facial symmetry is present. Speech is normal, no aphasia or dysarthria is noted. Extraocular movements are full. Visual fields are full.  Motor: The patient has good strength in all 4 extremities.  Coordination: The patient has good finger-nose-finger and heel-to-shin bilaterally.  Gait and station: The patient has a normal gait. Tandem gait is slightly unsteady.  Romberg is negative. No drift is seen.  Reflexes: Deep tendon reflexes are symmetric.   Assessment/Plan:  One. Memory disturbance  The patient has a mild memory disturbance. The patient is interested in going on medications for memory. We will start Aricept at this time. The patient will contact our office in one month if she is tolerating medication, and we will go to the 10 mg dose. The patient followup in 6 or 7 months.  Marlan Palau MD 08/12/2012 11:01 AM  [facility] [address] [place], [place] [address]  Phone [phone] Fax [phone]

## 2012-08-12 NOTE — Patient Instructions (Signed)
We will start aricept at 5 mg at night. If no side effects (stomach cramps, nausea, diarrhea, weight loss) on the 5 mg dose after one month, call our office for a 10 mg tablet dosing.

## 2012-08-17 ENCOUNTER — Other Ambulatory Visit: Payer: Self-pay | Admitting: Dermatology

## 2012-08-17 DIAGNOSIS — D485 Neoplasm of uncertain behavior of skin: Secondary | ICD-10-CM | POA: Diagnosis not present

## 2012-08-17 DIAGNOSIS — L570 Actinic keratosis: Secondary | ICD-10-CM | POA: Diagnosis not present

## 2012-08-19 ENCOUNTER — Telehealth: Payer: Self-pay | Admitting: Pulmonary Disease

## 2012-08-19 MED ORDER — FLUTICASONE PROPIONATE 50 MCG/ACT NA SUSP: 2.0000 | Freq: Every day | NASAL | Status: DC

## 2012-08-19 NOTE — Telephone Encounter (Signed)
Form received by patient for pt's Flonase Per the form, rx can be sent electronically Rx sent Pt is aware

## 2012-08-21 DIAGNOSIS — C44101 Unspecified malignant neoplasm of skin of unspecified eyelid, including canthus: Secondary | ICD-10-CM | POA: Diagnosis not present

## 2012-09-09 DIAGNOSIS — Z23 Encounter for immunization: Secondary | ICD-10-CM | POA: Diagnosis not present

## 2012-09-14 DIAGNOSIS — M461 Sacroiliitis, not elsewhere classified: Secondary | ICD-10-CM | POA: Diagnosis not present

## 2012-09-17 DIAGNOSIS — R109 Unspecified abdominal pain: Secondary | ICD-10-CM | POA: Diagnosis not present

## 2012-09-17 DIAGNOSIS — K5010 Crohn's disease of large intestine without complications: Secondary | ICD-10-CM | POA: Diagnosis not present

## 2012-09-18 DIAGNOSIS — H02059 Trichiasis without entropian unspecified eye, unspecified eyelid: Secondary | ICD-10-CM | POA: Diagnosis not present

## 2012-09-28 ENCOUNTER — Other Ambulatory Visit: Payer: Self-pay | Admitting: Cardiology

## 2012-10-09 DIAGNOSIS — R109 Unspecified abdominal pain: Secondary | ICD-10-CM | POA: Diagnosis not present

## 2012-10-21 DIAGNOSIS — M76899 Other specified enthesopathies of unspecified lower limb, excluding foot: Secondary | ICD-10-CM | POA: Diagnosis not present

## 2012-11-18 ENCOUNTER — Telehealth: Payer: Self-pay | Admitting: Pulmonary Disease

## 2012-11-18 MED ORDER — LEVOFLOXACIN 500 MG PO TABS: 500.0000 mg | ORAL_TABLET | Freq: Every day | ORAL | Status: DC

## 2012-11-18 NOTE — Telephone Encounter (Signed)
Per SN--  Call in levaquin 500 mg  #7  1 daily Take align once daily while on the abx.

## 2012-11-18 NOTE — Telephone Encounter (Signed)
I spoke with pt. She reports her cough is now gone since she took the tussin. She c/o drainage from the right nostril-thick green drainage. Pt has been taking mucinex daily. She is wanting to know if she needs ABX since she is having discolored drainage. Please advise SN thanks Last OV 06/10/12 Allergies  Allergen Reactions  . Bacitracin     REACTION: redness, rash, severe itching  . Carbamazepine     Intolerant to Tegretol.  REACTION: dizziness, blurred vision, nausea, HA, insomnia  . Codeine     REACTION: nause and vomiting  . Diclofenac Sodium     REACTION: severe headache  . Fentanyl     REACTION: chest pain, HBP, with second patch experienced vertigo and nausea  . Fluoxetine Hcl     REACTION: dizziness, blurred vision, nausea, HA, insomnia  . Hydromorphone Hcl     REACTION: intolerant to dilaudid with nausea, vomiting  . Indomethacin     REACTION: dizziness, blurred vision, nausea, HA, insomnia  . Lisinopril     REACTION: cough  . Meperidine Hcl     REACTION: intolerant to demerol with nausea, vomiting  . Morphine     REACTION: n/v  . Prednisone     REACTION: pt had psychiatric episode w/ hosp - lost memory x3weeks  . Pregabalin     REACTION: severe restless legs, insomnia  . Propranolol Hcl     Intolerant to Inderal.  REACTION: dizziness, blurred vision, nausea, HA, insomnia  . Refresh Lacri-Lube [Artificial Tears]     Caused and allergic reaction with redness, hard crust on eyes  . Ropinirole Hcl     REACTION: severe restless legs and insomnia  . Sulfamethoxazole     REACTION: nausea, gas, loose stools

## 2012-11-18 NOTE — Telephone Encounter (Signed)
Pt aware of recs. RX has been sent. Nothing further needed 

## 2012-12-02 DIAGNOSIS — H02059 Trichiasis without entropian unspecified eye, unspecified eyelid: Secondary | ICD-10-CM | POA: Diagnosis not present

## 2012-12-07 ENCOUNTER — Telehealth: Payer: Self-pay | Admitting: Pulmonary Disease

## 2012-12-07 NOTE — Telephone Encounter (Signed)
I called and spoke with pt. She has pending appt 12/16/12 and will discuss all this with him at that time. Nothing further needed

## 2012-12-07 NOTE — Telephone Encounter (Signed)
Copied from MyChart:  Appointment Request From: [name]  With Provider: Noralee Space, MD [-Primary Care Physician-]  Preferred Date Range: From 12/04/2012 To 12/09/2012  Preferred Times: Monday Morning, Thursday Morning  Reason: To address the following health maintenance concerns. Tetanus/Tdap Colonoscopy Zostavax  Comments:  Copied from MyChart:    Appointment Request From: [name]  With Provider: Noralee Space, MD [-Primary Care Physician-]  Preferred Date Range: From 12/04/2012 To 12/16/2012  Preferred Times: Wednesday Morning  Reason: To address the following health maintenance concerns. Tetanus/Tdap Colonoscopy Zostavax  Comments:

## 2012-12-11 ENCOUNTER — Other Ambulatory Visit: Payer: Self-pay | Admitting: Cardiology

## 2012-12-11 DIAGNOSIS — H02059 Trichiasis without entropian unspecified eye, unspecified eyelid: Secondary | ICD-10-CM | POA: Diagnosis not present

## 2012-12-16 ENCOUNTER — Encounter: Payer: Self-pay | Admitting: Pulmonary Disease

## 2012-12-16 ENCOUNTER — Ambulatory Visit (INDEPENDENT_AMBULATORY_CARE_PROVIDER_SITE_OTHER): Payer: Medicare Other | Admitting: Pulmonary Disease

## 2012-12-16 ENCOUNTER — Ambulatory Visit (INDEPENDENT_AMBULATORY_CARE_PROVIDER_SITE_OTHER)
Admission: RE | Admit: 2012-12-16 | Discharge: 2012-12-16 | Disposition: A | Discharge status: Home / Self Care | Payer: Medicare Other | Source: Ambulatory Visit | Attending: Pulmonary Disease | Admitting: Pulmonary Disease

## 2012-12-16 VITALS — BP 122/60 | HR 61 | Temp 98.8°F | Ht 61.0 in | Wt 168.4 lb

## 2012-12-16 DIAGNOSIS — K219 Gastro-esophageal reflux disease without esophagitis: Secondary | ICD-10-CM

## 2012-12-16 DIAGNOSIS — I10 Essential (primary) hypertension: Secondary | ICD-10-CM | POA: Diagnosis not present

## 2012-12-16 DIAGNOSIS — E785 Hyperlipidemia, unspecified: Secondary | ICD-10-CM

## 2012-12-16 DIAGNOSIS — R918 Other nonspecific abnormal finding of lung field: Secondary | ICD-10-CM | POA: Diagnosis not present

## 2012-12-16 DIAGNOSIS — IMO0001 Reserved for inherently not codable concepts without codable children: Secondary | ICD-10-CM

## 2012-12-16 DIAGNOSIS — E039 Hypothyroidism, unspecified: Secondary | ICD-10-CM

## 2012-12-16 DIAGNOSIS — I4892 Unspecified atrial flutter: Secondary | ICD-10-CM

## 2012-12-16 DIAGNOSIS — I2510 Atherosclerotic heart disease of native coronary artery without angina pectoris: Secondary | ICD-10-CM

## 2012-12-16 DIAGNOSIS — D126 Benign neoplasm of colon, unspecified: Secondary | ICD-10-CM

## 2012-12-16 DIAGNOSIS — G2581 Restless legs syndrome: Secondary | ICD-10-CM

## 2012-12-16 DIAGNOSIS — R413 Other amnesia: Secondary | ICD-10-CM

## 2012-12-16 DIAGNOSIS — M899 Disorder of bone, unspecified: Secondary | ICD-10-CM

## 2012-12-16 DIAGNOSIS — E669 Obesity, unspecified: Secondary | ICD-10-CM

## 2012-12-16 DIAGNOSIS — M1990 Unspecified osteoarthritis, unspecified site: Secondary | ICD-10-CM

## 2012-12-16 DIAGNOSIS — I679 Cerebrovascular disease, unspecified: Secondary | ICD-10-CM | POA: Diagnosis not present

## 2012-12-16 DIAGNOSIS — K5090 Crohn's disease, unspecified, without complications: Secondary | ICD-10-CM

## 2012-12-16 DIAGNOSIS — Z23 Encounter for immunization: Secondary | ICD-10-CM | POA: Diagnosis not present

## 2012-12-16 DIAGNOSIS — M545 Low back pain: Secondary | ICD-10-CM

## 2012-12-16 DIAGNOSIS — K5730 Diverticulosis of large intestine without perforation or abscess without bleeding: Secondary | ICD-10-CM

## 2012-12-16 NOTE — Progress Notes (Signed)
Subjective:    Patient ID: [name], female    DOB: [birth year], 79 y.o.   MRN: [id number]  HPI 79 y/o WF here for a follow up visit... she has mult med problems and meds as noted below >> See prev notes for earlier entries...  ~  December 11, 2011:  64mo ROV & Glena Norfolk has had a good interval- her CC= Insomnia but she doesn't want additional medication... 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    ENT> Hx MRSA infection & abscess drained by DrBates; she continues on Rx w/ Netti pot, Flonase, Mucinex, Meclizine...    Hx recurrent bronchitis & Abn CXR> 2cm left apical fluid density- ?nerve sheath tumor on prev CXRs (subtle medial left apical abn) and CTscans (easily seen & no change serially);  CXR today shows norm heart size, clear lungs, no change in subtle medial left apical extrapleural lesion...    HBP> on Coreg25Bid; BP= 124/60 & she denies CP, palpit, ch in SOB, edema, etc...    CAD> on ASA81; Hx non-obstructive dis w/ cath 2006 showing 30%LAD lesion & good LVF; followed by DrBensimhon/ Hochrein for Cards...    Hx AFlutter> s/p catheter ablation in 2005 by DrKlein and no known recurrence...    Cerebrovasc dis> on ASA81; known 80% left MCA stenosis (seen last on CTA Head 5/09) & she denies cerebral ischemic symptoms; followed by DrWillis...    Hyperlipid> on Lip40; last FLP 6/13 showed TChol 142, TG 93, HDL 79, LDL 45    Hypothyroid, Hx of Hyperpara> on Synthroid88; followed by DrBalan (we do not have notes from her); last TSH=3.10 Jun'13; last Ca=9.4 Jun'13- she had right inferior parathyroid gland resected 11/07 by DrGerkin...    Obesity> wt= 179# which is down 3# over the last 64mo; we reviewed diet, exercise, & wt reduction strategies...  GI- GERD, Divertics, IBS, Polyps, Crohn's> on Lialda, Questran, Immodium, Probiotic, Simethacone; managed by [facility] for GI- she denies abd discomfort, dysphagia, n/v, c/d, blood seen...    Rheum- FM> on Tramadol prn; followed by DrDeveshwar...    Osteopenia> on calcium, MVI, VitD2000; last VitD level = 47 Jun'13    HAs, RLS> on Mirapex0.5mg /d; seen by DrDohmeier...    Hx Psychosis> followed by DrPlovsky, not currently on medication... We reviewed prob list, meds, xrays and labs> see below for updates >> she had the 2013 flu vaccine in Aug'13; she brought a 4page med hx summary report... CXR 12/13 is clear- normal heart size, clear lungs- no change in subtle medial left apical lesion better delineated on old CTscans...  ~  January 15, 2012:  56mo ROV & add-on appt at pt's request due to memory issues> she brings a letter from her daughter to MrSnow about mother's memory, but she denies signif problem; she called DrWillis' office to be seen (he sees her for cerebrovasc dis, known 80% left MCA stenosis on CTA from 5/09) but was told they would not see her for Memory eval without a referral; she called here & was worked into the schedule so that we could evaluate her in advance of the desired neurology referral...    She also notes 2wk hx daily left temple region headaches w/ some tenderness noted; she rates the discomfort 1-2/10 & says it  doesn't really hurt unless she touches the area; we are checking labs including Sed Rate...    MMSE 01/15/12 showed the pt to be oriented to person, place & time- Date= 1/?/2014;  Pres=Obama, VP=?? "I hate politics", Gov=??;  Recall- 3/3 immed but only 1/3 after distraction;  Serial 7's= [phone];  World backwards= DLROW; Proverbs were mixed in their interpretation...  She does not want to start any medications or get screening scans at this time, just requests referral to DrWillis which we will gladly arrange... LABS 1/14:  Chems- wnl;  CBC- wnl;  TSH=9.38 on  Levothy88;  Sed=13 Thyroid dose increased to Levothy17mcg/d...   ~  June 10, 2012:  2mo ROV & [name] reports that her home BP checks have been great, but she really doesn't want to take the Lipitor & wonders if she can leave it off for now;  She reports that she is taking a number of supplements recommended by Marijean Niemann at [facility] & she is feeling really good- husband too...  She had a Neuro eval by drWillis 4/14- Epic note reviewed> children concerned about her memory (pt denies prob- but MMSE was 26/30 & animal fluency just 9 in 60sec), pt c/o chronic insomnia & fatigue, had 1 episode of dizziness/ near syncope (?postural episode); they recommended conservative approach & careful follow up...     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    ENT> Hx MRSA infection & abscess drained by DrBates in 2010; she continues on Rx w/ Netti pot, Flonase, Mucinex, Meclizine...    Hx recurrent bronchitis & Abn CXR> 2cm left apical fluid density- ?nerve sheath tumor on prev CXRs (subtle medial left apical abn) and CTscans (easily seen & no change serially);  CXR today shows norm heart size, clear lungs, no change in subtle medial left apical extrapleural lesion...    HBP> on Coreg25Bid; BP= 150/70 & she denies CP, palpit, ch in SOB, edema, etc...    CAD> on ASA81; Hx non-obstructive dis w/ cath 2006 showing 30%LAD lesion & good LVF; followed by DrBensimhon/ Hochrein for Cards...    Hx AFlutter> s/p catheter ablation in 2005 by DrKlein and no known recurrence...    Cerebrovasc dis> on ASA81; known 80% left MCA stenosis (seen last on CTA Head 5/09) & she denies cerebral ischemic symptoms; followed by DrWillis...    Hyperlipid> on Lip40; last FLP 6/13 showed TChol 142, TG 93, HDL 79, LDL 45; she wants to stop the Lipitor & take the alternative supplements from [place] at [facility]  alternative store!!!     Hypothyroid, Hx of Hyperpara> on Synthroid100; followed by DrBalan (we do not have notes from her); last TSH=3.10 Jun'13; last Ca=9.4 Jun'13- she had right inferior parathyroid gland resected 11/07 by DrGerkin...    Overweight> wt= 175# which is down 3# over the last 21mo; we reviewed diet, exercise, & wt reduction strategies...    GI- GERD, Divertics, IBS, Polyps, Crohn's> on Lialda1.2gm-3/d, Questran 2scoops/d, Immodium, Probiotic, Simethacone; managed by [facility] for GI- seen 10/14 f/u abd cramping & tenderness; treated w/ cipro & improved; she now denies abd discomfort, dysphagia, n/v, c/d, blood seen...    Rheum- FM> on Tramadol prn; followed by DrDeveshwar...    Osteopenia> on calcium, MVI, VitD2000; last VitD level = 47 Jun'13    Dementia> she has early memory impairment noted by Family, she is followed by  DrWillis for Neuro- MMSE was 26/30=> 23/30 & animal fluency just 9 in 60sec, she does not realize there is a problem; Neuro placed her on Aricept but she stopped it on her own...     HAs, RLS> on Mirapex0.5mg /d; seen by DrDohmeier...    Hx Psychosis> followed by DrPlovsky, not currently on medication... We reviewed prob list, meds, xrays and labs> see below for updates >>   ~  December 16, 2012:  364mo ROV & Donalee reports stable- no new complaints or concerns but hermemory is impaired and she saw DrWillis 8/14 w/ MMSE 23/30 so they started Aricept but she says she stopped it "I don't think I need it" "I'm doing fine now- I just write things down" she does however c/o memory issues she's noted in he husb!  When asked about her f/u w/ DrHayes (GI- 9/14) she did not recall his eval & Rx w/ Cipro- "I have such a long medical history that I can't keep up w/ it"...     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    ENT> Hx MRSA infection & abscess drained by DrBates in 2010; she continues on Rx w/ Netti pot, Flonase, Mucinex, Meclizine...    Hx recurrent bronchitis & Abn CXR> 2cm left apical fluid density- ?nerve sheath tumor on prev CXRs (subtle medial left apical abn) and CTscans (easily seen & no change serially);  CXR today shows norm heart size, clear lungs, no change in subtle medial left apical extrapleural lesion...    HBP> on Coreg25Bid; BP= 122/60 & she denies CP, palpit, ch in SOB, edema, etc...    CAD> on ASA81; Hx non-obstructive dis w/ cath 2006 showing 30%LAD lesion & good LVF; followed by DrBensimhon/ Hochrein for Cards...    Hx AFlutter> s/p catheter ablation in 2005 by DrKlein and no known recurrence...    Cerebrovasc dis> on ASA81; known 80% left MCA stenosis (seen last on CTA Head 5/09) & she denies cerebral ischemic symptoms; followed by DrWillis...    Hyperlipid> back on Lip40 now; last FLP 7/14 off meds showed TChol 275, TG 113, HDL 69, LDL 189; she decided to restart Lip40 & CoQ10...     Hypothyroid, Hx of Hyperpara> on Synthroid100; followed by DrBalan (we do not have notes from her); last TSH=3.89 [id number]; last Ca=9.4 [id number]- she had right inferior parathyroid gland resected 11/07 by DrGerkin...    Overweight> wt= 168# which is down 7# over the last 364mo; we reviewed diet, exercise, & wt reduction strategies...    GI- GERD, Divertics, IBS, Polyps, Crohn's> on Lialda1.2gm-3/d, Questran 2scoops/d, Immodium, Probiotic, Simethacone; managed by [facility] for GI- seen 10/14 f/u abd cramping & tenderness; treated w/ cipro & improved; she now denies abd discomfort, dysphagia, n/v, c/d, blood seen...    Rheum- FM> on Tramadol prn; followed by DrDeveshwar...    Osteopenia> on calcium, MVI, VitD2000; last VitD level = 47 Jun'13    Dementia> she has early memory impairment noted by Family, she is followed by DrWillis for Neuro- MMSE was 26/30=> 23/30 & animal fluency just 9 in 60sec, she  does not realize there is a problem; Neuro placed her on Aricept but she stopped it on her own...     HAs, RLS> on Mirapex0.5mg /d; seen by DrDohmeier...    Hx Psychosis> followed by DrPlovsky, not currently on  medication... We reviewed prob list, meds, xrays and labs> see below for updates >> she had the 2014 Flu vaccine 9/14 7 we wrote Rx for Shingles shot today...  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 ~  She continues periodic [facility] DrFowler [facility]), DrMurray ([facility])... 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  Hx of ENT Problem>  left nasal soft tissue abscess drained by DrBates- MRSA treated w/ drainage, IV antibiotics, then Doxy... she uses Claritin & Flonase for sinuses.  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  ~  CT Chest 3/10 (at [facility]) showed atherosclerotic dis in Ao & coronaries, calcif AoV, left lung apex extra-pleural mass ~2cm, 4mm LUL nodule, scat ground-glass opac, atelec, marked biliary ductal dil- consider MRCP... (Old films and CT Chest here 6/07 & 10/09 compared- not mentioned on 2007 scan but the 2009 scan noted lesion left apex 2.6 x 2.0cm <sl larger than 2007 scan in retrospect> ?neurogenic/ nerve sheath tumor). ~  Lesion not mentioned on routine CXRs> CXR 5/12 norm heart, clear lungs, NAD.Marland Kitchen. ~  CXR 12/13 is clear- normal heart size, clear lungs- no change in subtle medial left apical lesion better delineated on old CTscans... ~   CXR 12/14 showed normal heart size, clear lungs, unchanged medial left apical extrapleural lesion, NAD...   OBSTRUCTIVE SLEEP APNEA  - Hx mild OSA on Sleep Study by DrDohmeier... she never really used the CPAP and states that "now I'm fine off of it"... she does have some insomnia & uses BENEDRYL Prn.  HYPERTENSION (ICD-401.9) - controlled on COREG 25mg Bid...  ~  11/12:  BP=122/60 and pressures are good at home etc;  denies HA, visual changes, CP, palipit, dizziness, dyspnea, edema, etc; she has chronic fatigue symptoms. ~  5/13:  BP= 124/68 & she remains largely asymptomatic... ~  12/14: on Coreg25Bid; BP= 122/60 & she denies CP, palpit, ch in SOB, edema, etc...  CORONARY ARTERY DISEASE (ICD-414.00) - on COREG 25mg Bid, & supposed to be on ASA 81mg /d... non-obstructive disease w/ cath 9/06 showing 30% LAD lesion, good LVF... neg Myoview 3/06 no ischemia, EF=73%... she was prev followed by DrBensimhon for Cards. ~  10/13: she saw DrHochrein- HBP, CAD, risk factors, hx AFlutter- s/p ablation; EKG showed NSR, rate66, wnl, NAD; doing well w/o CV complaints, not exercising & advised to incr exercise program... ~  12/14: on ASA81; Hx non-obstructive dis w/ cath 2006 showing 30%LAD lesion & good LVF; followed by DrBensimhon/ Hochrein for Cards...  Hx of ATRIAL FLUTTER (ICD-427.32) - S/P catheter ablation in 2005 by DrKlein.  CEREBROVASCULAR DISEASE (ICD-437.9) - eval by DrWillis 5/09 and DrBritz (Neurosurg @ [facility]) confirms 80% left middle cerebral art stenosis which is felt to be asymptomatic... they advise ASA 81mg /d, and secondary risk factor modification...  HYPERLIPIDEMIA (ICD-272.4) - now on LIPITOR 40mg /d + FISH OIL Bid...  ~  FLP 10/08 on Vytor10/80 showed TChol 130, TG 139, HDL 38, LDL 64... ~  FLP 4/09 on Vytor10/80 showed  TChol 138, TG 102, HDL 62, LDL 56... ~  Vytorin & Fish Oil stopped 11/09 in [place]. ~  Jan10: DrBensimhon started Lipitor 40/d. ~  FLP 6/10 on Lip40 showed TChol  148, TG 94, HDL 67, LDL 62 ~  FLP 6/11 on Lip40 showed TChol 186, TG 76, HDL 82, LDL 88 ~  FLP 5/12 on Lip40 showed TChol 156, TG 110, HDL 81, LDL 53 ~  FLP 6/13 on Lip40 showed TChol 142, TG 93, HDL 79, LDL 45 ~  6/14:  She wants to stop the Lipitor & take natural supplements from [employer] [facility] health food store... ~  FLP 7/14 on diet supplements showed TChol 275, TG 113, HDL 69, LDL 189; she decided to restart Lip40 & CoQ10...  OBESITY (ICD-278.00) - she notes that her fibromyalgia makes it difficult for her to exercise. ~  weight 8/09 = 221# ~  weight 12/09 = 197# ~  weight 4/10 = 169#... great job w/ diet program. ~  weight 6/11 = 173# ~  weight 11/11 = 190#... we discussed diet + exercise. ~  Weight 5/12 = 189# ~  Weight 5/13 = 182# ~  Weight 6/14 = 175# ~  Weight 12/14 = 168#       << GI -  MANAGED BY DrJHAYES >> GASTROESOPHAGEAL REFLUX DISEASE (ICD-530.81) - followed by DrJHayes for GI on NEXIUM 40Bid... she has GERD, Hx gastric ulcer. DIVERTICULOSIS OF COLON (ICD-562.10) - hx diverticulitis in past... She takes PROBIOTIC & SIMETHACONE... IRRITABLE BOWEL SYNDROME (ICD-564.1) - DrHayes' eval w/ colon reported to show "ulcers" c/w Crohn's dis... COLONIC POLYPS - Hx of divertics w/ diverticulitis in the past and IBS, and hx of Colon Polyps w/ prev colonoscopy in 4/06... see 10/09 hosp by [facility] and f/u colonoscopy w/ ?Crohn's... CROHN'S DISEASE (ICD-555.9) - on LIALDA 1.2gm- 3 tabs daily, QUESTRAN 2 scoops daily, & IMMODIUM Prn...       << ENDOCRINE - Prev MANAGED BY DrBALAN >> HYPERPARATHYROIDISM, HX OF (ICD-V12.2) - she had hypercalcemia w/ right inferior parathyroid gland resected 11/07 by DrGerkin... HYPOTHYROIDISM (ICD-244.9) - on SYNTHROID daily... prev followed by DrBalan. ~  labs 6/07 showed TSH= 5.16... managed by DrBalan at that time. ~  labs here 7/10 on Levothy112 showed TSH= 0.16... rec> decr to 1/2 tab & f/u lab. ~  labs here 10/10 showed TSH= 10.04...  rec> change to 15mcg/d. ~  labs here 6/11 on Levothy75 showed TSH= 6.84... rec> step up to 17mcg/d. ~  Labs 5/12 on Levothy88 showed TSH= 13.14... rec to incr to 161mcg/d (she never did)... ~  Labs 11/12 on Levo88 showed TSH= 2.63... Continue same. ~  Labs 5/13 on Levo88 showed TSH= 3.10 ~  Labs 1/14 on Levo88 showed TSH= 9.38 & we will incr to Levothy146mcg/d... ~  Labs 7/14 on Levo100 showed TSH= 3.89       << RHEUM - MAMAGED BY DrDEVESHWAR >> FIBROMYALGIA (ICD-729.1) - followed by DrDeveshwar... she uses TRAMADOL & LIDODERM patches for pain. OSTEOPENIA (ICD-733.90) - on FOSAMAX 70mg /wk, ca++, vits... prev followed by DrBalan... ~  last BMD 9/08 showed TScores -0.8 in Spine & -2.3 in right FemNeck... ~  f/u BMD 10/10 showed TScores invalid in Spine, & -2.2 in right FemNeck... continue Rx. ~  BMD here 12/12 showed TScores+0.8 in spine (osteophytes & scoliosis), and -2.1 in left FemNeck... Continue same. ~  Nuclear Bone Scan 2/13 showed areas of degenerative uptake in shoulders, sternoclavic joints, lower lum spine, bilat hips & knees & feet... ~  2/13: XRay Lumbar spine showed mild scoliosis, mod degen changes at L3-4 & L4-5, vasc calcif also seen...  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  SYNCOPE (ICD-780.2) - after a 2009 trip to visit family in [place]- she was flying home to [place] and passed out on the plane... she was taken to a hosp outside of [place] and adm for several days... ~ syncopal spell "altered level of consciusness" she remembers being on the plane and next in the ER...  their DC note indicates poss seizure but nothing to substantiate this (no seiz activ noted by anyone)...  ~  MRI Brain 2009 was neg x  max sinusitis... MRA however showed 80% stenosis in the left MCA... ~  CDoppler 2009 showed mild plaque, norm waveforms, no signif stenoses... ~  2DEcho 2009 showed basically WNL, borderline conc LVH, EF=55-60%... ~   Eval by DrWillis for Memory & brief dizzy spell/ near syncope 2014> Epic note reviewed> children concerned about her memory (pt denies prob- but MMSE was 26/30 & animal fluency just 9 in 60sec), pt c/o chronic insomnia & fatigue, had 1 episode of dizziness/ near syncope (?postural episode); they recommended conservative approach & careful follow up... ~  MRI Brain 2/14 by [facility] showed mild chronic microvasc ischemia, small developmental venous anomaly in left frontal area, no infarcts, no lesions...   RESTLESS LEG SYNDROME (ICD-333.94) - she's been on MIRAPEX 0.5mg  Qd... ~  10/10: c/o incr symptoms and instructed to incr Mirapex to 2 tabs...  PSYCHOSIS (ICD-298.9) - this was attributed to Prednisone therapy for the Crohn's dis... hosp in [place] and mult meds discontinued and changed by Psychiatrists... now followed by [facility] and he is off the prev Depakote & Keppra... & she is also off the prev Zoloft & Restoril...   Past Surgical History  Procedure Laterality Date  . Parathyroid adenoma surgery    . Cholecystectomy    . Moh';s surg left lower eye lid surgery and lid reconstruction surgery  2/10  . Bladder suspension    . Cataract extraction Bilateral   . Appendectomy    . Tonsillectomy and adenoidectomy    . Lumbar laminectomy    . Incision and drainage perirectal abscess    . Heel spur excision    . Nasal sinus surgery Right     Right maxillary  . Eye lid surgery  04/04/2011    [facility] by Dr. Mickie Kay    Outpatient Encounter Prescriptions as of 12/16/2012  Medication Sig  . aspirin 81 MG tablet Take 81 mg by mouth daily.    Marland Kitchen atorvastatin (LIPITOR) 40 MG tablet Take 1 tablet (40 mg total) by mouth daily.  . Biotin 5000 MCG TABS Take 1 tablet by mouth daily.    . Calcium-Magnesium 500-250 MG TABS Take 2 tablets by mouth daily.  . carvedilol (COREG) 25 MG tablet Take 25 mg by mouth 2 (two) times daily with a meal. am  . carvedilol (COREG) 25 MG tablet TAKE 1 TABLET TWICE A DAY  WITH MEALS  . Cholecalciferol (VITAMIN D3) 2000 UNITS TABS Take 1 tablet by mouth daily.  . cholestyramine (QUESTRAN) 4 GM/DOSE powder 2 scoops per day   . co-enzyme Q-10 50 MG capsule Take 50 mg by mouth daily.  Marland Kitchen donepezil (ARICEPT) 5 MG tablet Take 1 tablet (5 mg total) by mouth at bedtime.  . Evening Primrose Oil 500 MG CAPS 1 tablet twice a day  . fish oil-omega-3  fatty acids 1000 MG capsule 3 capsules daily  . fluticasone (FLONASE) 50 MCG/ACT nasal spray Place 2 sprays into the nose daily.  Marland Kitchen guaifenesin (HUMIBID E) 400 MG TABS 1 TABLET BY MOUTH TWO TIMES DAILY  . levofloxacin (LEVAQUIN) 500 MG tablet Take 1 tablet (500 mg total) by mouth daily.  Marland Kitchen levothyroxine (SYNTHROID, LEVOTHROID) 100 MCG tablet Take 88 mcg by mouth daily.  Marland Kitchen lidocaine (LIDODERM) 5 % Apply as directed  . loperamide (IMODIUM A-D) 2 MG tablet As needed  . meclizine (ANTIVERT) 25 MG tablet Take 1 tablet (25 mg total) by mouth 3 (three) times daily as needed. Take 1 tablet every 6 hours as needed  . mesalamine (LIALDA) 1.2 G EC tablet Take 3 tablets by mouth once a day as directed by Dr. Madilyn Fireman  . Multiple Vitamins-Minerals (SUPER VITA-MINS PO) Take by mouth daily. Daily at noon  . pramipexole (MIRAPEX) 0.5 MG tablet Take 1 tablet (0.5 mg total) by mouth 2 (two) times daily.  Marland Kitchen PROBIOTIC CAPS Take 1 capsule by mouth daily.  . traMADol (ULTRAM) 50 MG tablet Take 1 tablet (50 mg total) by mouth every 8 (eight) hours as needed for pain.  . vitamin C (ASCORBIC ACID) 500 MG tablet Take 500 mg by mouth daily.    Allergies  Allergen Reactions  . Bacitracin     REACTION: redness, rash, severe itching  . Carbamazepine     Intolerant to Tegretol.  REACTION: dizziness, blurred vision, nausea, HA, insomnia  . Codeine     REACTION: nause and vomiting  . Diclofenac Sodium     REACTION: severe headache  . Fentanyl     REACTION: chest pain, HBP, with second patch experienced vertigo and nausea  . Fluoxetine Hcl     REACTION:  dizziness, blurred vision, nausea, HA, insomnia  . Hydromorphone Hcl     REACTION: intolerant to dilaudid with nausea, vomiting  . Indomethacin     REACTION: dizziness, blurred vision, nausea, HA, insomnia  . Lisinopril     REACTION: cough  . Meperidine Hcl     REACTION: intolerant to demerol with nausea, vomiting  . Morphine     REACTION: n/v  . Prednisone     REACTION: pt had psychiatric episode w/ hosp - lost memory x3weeks  . Pregabalin     REACTION: severe restless legs, insomnia  . Propranolol Hcl     Intolerant to Inderal.  REACTION: dizziness, blurred vision, nausea, HA, insomnia  . Refresh Lacri-Lube [Artificial Tears]     Caused and allergic reaction with redness, hard crust on eyes  . Ropinirole Hcl     REACTION: severe restless legs and insomnia  . Sulfamethoxazole     REACTION: nausea, gas, loose stools    Current Medications, Allergies, Past Medical History, Past Surgical History, Family History, and Social History were reviewed in [employer] record.     Review of Systems        See HPI - all other systems neg except as noted... The patient complains of weight gain, dyspnea on exertion, and muscle weakness.  The patient denies anorexia, fever, weight loss, vision loss, decreased hearing, hoarseness, chest pain, syncope, peripheral edema, prolonged cough, headaches, hemoptysis, abdominal pain, melena, hematochezia, severe indigestion/heartburn, hematuria, incontinence, suspicious skin lesions, transient blindness, difficulty walking, depression, unusual weight change, abnormal bleeding, enlarged lymph nodes, angioedema, and breast masses.    Objective:   Physical Exam     WD, Overweight, 79 y/o WF in NAD...  GENERAL:  Alert & oriented; pleasant & cooperative... HEENT:  [place]/AT, EOM-wnl, EACs-clear, TMs-wnl, NOSE-clear, THROAT-clear & wnl.  << s/p mult surg left lower lid- sl red, improved... NECK:  Supple w/ fairROM; no JVD; normal carotid  impulses w/o bruits; no thyromegaly or nodules palpated; no lymphadenopathy. CHEST:  Clear to P & A; without wheezes/ rales/ or rhonchi. HEART:  Regular Rhythm; gr 1/6 SEM without rubs/ or gallops. ABDOMEN:  Obese soft & nontender; normal bowel sounds; no organomegaly or masses detected. EXT: without deformities, mild arthritic changes; no varicose veins/ +venous insuffic/ no edema... NEURO:  CN's intact; no focal neuro deficits... DERM:  No lesions noted; no rash etc...  RADIOLOGY DATA:  Reviewed in the EPIC EMR & discussed w/ the patient...  LABORATORY DATA:  Reviewed in the EPIC EMR & discussed w/ the patient...   Assessment & Plan:    MEMORY>> Abbreviated MMSE shows early but definite lapses in responses & she requests that we set up the referral to DrWillis=> MMSE was 26/30 => 23/30 & animal fluency just 9 in 60sec, they recommended Aricept but she stopped it on her own...   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  HBP>  Controlled on the Coreg but needs better low sodium wt reducing diet...  Cardiac> CAD, Hx AFlutter s/p ablation>  Stable on ASA + the Coreg, continue same...  HYPERLIPID>  Stable on the Lip40=> but she wants to switch to natural alternative supplements=> f/u FLp was way off & she started back on Lip40...  Obesity> she has been somewhat successful losing the wt, we reviewed wt reducing diet + exercise...  GI>  Followed by [facility] as noted for her Crohn's etc & she has been doing well, "he released me" she says...  HYPOTHYROID>  TSH is elev at 9.38 on the Synthroid88 (we will confirm taking it daily) & incr dose to 151mcg/d => f/u TSH is 3.89  Rheum>  FM/ osteopenia>  Followed by DrDeveshwar for Rheum, continue her regimen...   Patient's Medications  New Prescriptions   No medications on file  Previous Medications   ASPIRIN 81 MG TABLET    Take 81 mg by mouth daily.      ATORVASTATIN (LIPITOR) 40 MG TABLET    Take 1 tablet (40 mg total) by mouth daily.   BIOTIN 5000 MCG TABS    Take 1 tablet by mouth daily.     CALCIUM-MAGNESIUM 500-250 MG TABS    Take 2 tablets by mouth daily.   CARVEDILOL (COREG) 25 MG TABLET    TAKE 1 TABLET TWICE A DAY WITH MEALS   CHOLECALCIFEROL (VITAMIN D3) 2000 UNITS TABS    Take 1 tablet by mouth daily.   CHOLESTYRAMINE (QUESTRAN) 4 GM/DOSE POWDER    2 scoops per day    CO-ENZYME Q-10 50 MG CAPSULE    Take 50 mg by mouth daily.   EVENING PRIMROSE OIL 500 MG CAPS    1 tablet twice a day   FISH OIL-OMEGA-3 FATTY ACIDS 1000 MG CAPSULE    3 capsules daily   FLUTICASONE (FLONASE) 50 MCG/ACT NASAL SPRAY    Place 2 sprays into the nose daily.   GUAIFENESIN (HUMIBID E) 400 MG TABS    1 TABLET BY MOUTH TWO TIMES DAILY   LEVOTHYROXINE (SYNTHROID, LEVOTHROID) 100 MCG TABLET    Take 88 mcg by mouth daily.   LIDOCAINE (LIDODERM) 5 %    Apply as  directed   LOPERAMIDE (IMODIUM A-D) 2 MG TABLET    As needed   MECLIZINE (ANTIVERT) 25 MG TABLET    Take 1 tablet (25 mg total) by mouth 3 (three) times daily as needed. Take 1 tablet every 6 hours as needed   MESALAMINE (LIALDA) 1.2 G EC TABLET    Take 3 tablets by mouth once a day as directed by Dr. Madilyn Fireman   MULTIPLE VITAMINS-MINERALS (SUPER VITA-MINS PO)    Take by mouth daily. Daily at noon   PRAMIPEXOLE (MIRAPEX) 0.5 MG TABLET    Take 1 tablet (0.5 mg total) by mouth 2 (two) times daily.   PROBIOTIC CAPS    Take 1 capsule by mouth daily.   TRAMADOL (ULTRAM) 50 MG TABLET    Take 1 tablet (50 mg total) by mouth every 8 (eight) hours as needed for pain.   VITAMIN C (ASCORBIC ACID) 500 MG TABLET    Take 500 mg by mouth daily.  Modified Medications   No medications on file  Discontinued Medications   CARVEDILOL (COREG) 25 MG TABLET    Take 25 mg by mouth 2 (two) times daily with a meal. am   DONEPEZIL (ARICEPT) 5 MG TABLET    Take 1 tablet (5 mg total) by mouth at bedtime.   LEVOFLOXACIN (LEVAQUIN) 500 MG  TABLET    Take 1 tablet (500 mg total) by mouth daily.

## 2012-12-16 NOTE — Patient Instructions (Signed)
Today we updated your med list in our EPIC system...    Continue your current medications the same...  Today we did your non-fasting blood work & a follow up CXR...    We will contact you w/ the results when available...   We gave you a combination tetanus shot called the TDAP (it is good for 10 yrs)...    We wrote a prescription for a SHINGLES vaccine that you can get at your pharmacy shot clinic...  Call for any questions...  Let's plan a follow up visit in 62mo, sooner if needed for problems.Marland KitchenMarland Kitchen

## 2013-01-09 ENCOUNTER — Other Ambulatory Visit: Payer: Self-pay | Admitting: Pulmonary Disease

## 2013-01-11 ENCOUNTER — Other Ambulatory Visit: Payer: Self-pay | Admitting: Pulmonary Disease

## 2013-01-26 DIAGNOSIS — D239 Other benign neoplasm of skin, unspecified: Secondary | ICD-10-CM | POA: Diagnosis not present

## 2013-01-26 DIAGNOSIS — L570 Actinic keratosis: Secondary | ICD-10-CM | POA: Diagnosis not present

## 2013-01-26 DIAGNOSIS — L821 Other seborrheic keratosis: Secondary | ICD-10-CM | POA: Diagnosis not present

## 2013-02-12 ENCOUNTER — Encounter: Payer: Self-pay | Admitting: Nurse Practitioner

## 2013-02-12 ENCOUNTER — Ambulatory Visit (INDEPENDENT_AMBULATORY_CARE_PROVIDER_SITE_OTHER): Payer: Medicare Other | Admitting: Nurse Practitioner

## 2013-02-12 VITALS — BP 136/71 | HR 67 | Ht 61.75 in | Wt 168.0 lb

## 2013-02-12 DIAGNOSIS — G4733 Obstructive sleep apnea (adult) (pediatric): Secondary | ICD-10-CM | POA: Diagnosis not present

## 2013-02-12 DIAGNOSIS — R413 Other amnesia: Secondary | ICD-10-CM | POA: Diagnosis not present

## 2013-02-12 MED ORDER — DONEPEZIL HCL 5 MG PO TABS: 5.0000 mg | ORAL_TABLET | Freq: Every day | ORAL | Status: DC

## 2013-02-12 NOTE — Patient Instructions (Signed)
Pt will restart Aricept at 5mg  daily for 1 month then call for prescription for 10mg   Memory stable F/U in 6 months

## 2013-02-12 NOTE — Progress Notes (Signed)
[facility]  PATIENT: [name] DOB: 1932/07/25   REASON FOR VISIT: Followup for memory loss   HISTORY OF PRESENT ILLNESS: [name], 80 year old female returns for followup. She was evaluated for memory loss by Dr. Jannifer Hayes 08/12/2012. Her MRI of the brain showed minimal small vessel disease and her dementia labs were normal. She was placed on Aricept 5 mg and was to call in one month if she was tolerating the medication, unfortunately she did not understand the recommendations and she only took the Aricept for one month. She is here for reevaluation. She reports her memory is about the same. She continues to perform her activities of daily living as well as cooks. Her husband does the finances. She returns for reevaluation    HISTORY:  progressive memory disturbance that has been present for about a year. The patient has not noted any significant cognitive changes since last seen. The patient does not operate a motor vehicle. The patient overall has been doing well. The patient does not sleep well at night, but she generally has good energy levels during the day. The patient is interested in getting on a medication to help slow down progression of memory. MRI evaluation done previously shows minimal small vessel disease, and blood work was unremarkable.   REVIEW OF SYSTEMS: Full 14 system review of systems performed and notable only for those listed, all others are neg:  Constitutional: N/A  Cardiovascular: N/A  Ear/Nose/Throat: N/A  Skin: N/A  Eyes: N/A  Respiratory: N/A  Gastroitestinal: N/A  Hematology/Lymphatic: N/A  Endocrine: N/A Musculoskeletal:N/A  Allergy/Immunology: N/A  Neurological: memory loss Psychiatric: N/A   ALLERGIES: Allergies  Allergen Reactions  . Bacitracin     REACTION: redness, rash, severe itching  . Carbamazepine     Intolerant to Tegretol.  REACTION: dizziness, blurred vision, nausea, HA, insomnia  . Codeine     REACTION: nause  and vomiting  . Diclofenac Sodium     REACTION: severe headache  . Fentanyl     REACTION: chest pain, HBP, with second patch experienced vertigo and nausea  . Fluoxetine Hcl     REACTION: dizziness, blurred vision, nausea, HA, insomnia  . Hydromorphone Hcl     REACTION: intolerant to dilaudid with nausea, vomiting  . Indomethacin     REACTION: dizziness, blurred vision, nausea, HA, insomnia  . Lisinopril     REACTION: cough  . Meperidine Hcl     REACTION: intolerant to demerol with nausea, vomiting  . Morphine     REACTION: n/v  . Prednisone     REACTION: pt had psychiatric episode w/ hosp - lost memory x3weeks  . Pregabalin     REACTION: severe restless legs, insomnia  . Propranolol Hcl     Intolerant to Inderal.  REACTION: dizziness, blurred vision, nausea, HA, insomnia  . Refresh Lacri-Lube [Artificial Tears]     Caused and allergic reaction with redness, hard crust on eyes  . Ropinirole Hcl     REACTION: severe restless legs and insomnia  . Sulfamethoxazole     REACTION: nausea, gas, loose stools    HOME MEDICATIONS: Outpatient Prescriptions Prior to Visit  Medication Sig Dispense Refill  . aspirin 81 MG tablet Take 81 mg by mouth daily.        Marland Kitchen atorvastatin (LIPITOR) 40 MG tablet Take 1 tablet (40 mg total) by mouth daily.  90 tablet  3  . Biotin 5000 MCG TABS Take 1 tablet by mouth daily.        Marland Kitchen  Calcium-Magnesium 500-250 MG TABS Take 2 tablets by mouth daily.      . carvedilol (COREG) 25 MG tablet TAKE 1 TABLET TWICE A DAY WITH MEALS  180 tablet  0  . Cholecalciferol (VITAMIN D3) 2000 UNITS TABS Take 1 tablet by mouth daily.      . cholestyramine (QUESTRAN) 4 GM/DOSE powder 2 scoops per day       . co-enzyme Q-10 50 MG capsule Take 50 mg by mouth daily.      . Evening Primrose Oil 500 MG CAPS 1 tablet twice a day      . fish oil-omega-3 fatty acids 1000 MG capsule 3 capsules daily      . fluticasone (FLONASE) 50 MCG/ACT nasal spray Place 2 sprays into the nose  daily.  48 g  3  . guaifenesin (HUMIBID E) 400 MG TABS 1 TABLET BY MOUTH TWO TIMES DAILY      . levothyroxine (SYNTHROID, LEVOTHROID) 100 MCG tablet Take 88 mcg by mouth daily.      Marland Kitchen levothyroxine (SYNTHROID, LEVOTHROID) 100 MCG tablet TAKE 1 TABLET DAILY  90 tablet  2  . lidocaine (LIDODERM) 5 % Apply as directed  30 patch  0  . loperamide (IMODIUM A-D) 2 MG tablet As needed      . meclizine (ANTIVERT) 25 MG tablet Take 1 tablet (25 mg total) by mouth 3 (three) times daily as needed. Take 1 tablet every 6 hours as needed  30 tablet  5  . mesalamine (LIALDA) 1.2 G EC tablet Take 3 tablets by mouth once a day as directed by Dr. Amedeo Plenty  270 tablet  1  . MIRAPEX 0.5 MG tablet TAKE 1 TABLET TWO TIMES DAILY  180 tablet  2  . Multiple Vitamins-Minerals (SUPER VITA-MINS PO) Take by mouth daily. Daily at noon      . PROBIOTIC CAPS Take 1 capsule by mouth daily.      . traMADol (ULTRAM) 50 MG tablet Take 1 tablet (50 mg total) by mouth every 8 (eight) hours as needed for pain.  270 tablet  1  . vitamin C (ASCORBIC ACID) 500 MG tablet Take 500 mg by mouth daily.       No facility-administered medications prior to visit.    PAST MEDICAL HISTORY: Past Medical History  Diagnosis Date  . OSA (obstructive sleep apnea)   . History of bronchitis   . Other diseases of lung, not elsewhere classified   . Hypertension   . CAD (coronary artery disease)     LAD 30% 2006  . History of atrial flutter   . Cerebrovascular disease   . Hyperlipemia   . Obesity   . GERD (gastroesophageal reflux disease)   . Diverticulosis of colon   . IBS (irritable bowel syndrome)   . Colonic polyp   . Crohn's disease   . Hypothyroidism   . History of hyperparathyroidism   . Fibromyalgia   . Osteopenia   . History of headache   . Syncope   . Restless legs syndrome (RLS)   . Psychosis   . History of tuberculosis   . Radiation 05/2008    5000 cGy to left lower eyelid, parotid lymphatics and upper neck  . Memory  disturbance   . Bipolar affective     History of psychosis  . Merkel cell tumor     Left eyelid    PAST SURGICAL HISTORY: Past Surgical History  Procedure Laterality Date  . Parathyroid adenoma surgery    .  Cholecystectomy    . Moh';s surg left lower eye lid surgery and lid reconstruction surgery  2/10  . Bladder suspension    . Cataract extraction Bilateral   . Appendectomy    . Tonsillectomy and adenoidectomy    . Lumbar laminectomy    . Incision and drainage perirectal abscess    . Heel spur excision    . Nasal sinus surgery Right     Right maxillary  . Eye lid surgery  04/04/2011    [facility] by Dr. Norlene Duel    FAMILY HISTORY: Family History  Problem Relation Age of Onset  . Colon cancer      grandmother  . Arthritis Mother   . Hypertension Mother   . Ulcers Mother   . Heart failure Father   . Pulmonary embolism      SOCIAL HISTORY: History   Social History  . Marital Status: Married    Spouse Name: Joe    Number of Children: 2  . Years of Education: N/A   Occupational History  .     Social History Main Topics  . Smoking status: Former Smoker -- 6 years    Types: Cigarettes    Quit date: 01/08/1960  . Smokeless tobacco: Never Used  . Alcohol Use: No  . Drug Use: No  . Sexual Activity: Not on file   Other Topics Concern  . Not on file   Social History Narrative  . No narrative on file     PHYSICAL EXAM  Filed Vitals:   02/12/13 1003  BP: 136/71  Pulse: 67  Height: 5' 1.75" (1.568 m)  Weight: 168 lb (76.204 kg)   Body mass index is 30.99 kg/(m^2).  Generalized: Well developed, in no acute distress, well groomed   Neurological examination   Mentation: Alert MMSE 22/30 missing items in orientation, calculation and 3 of 3 recall.AFT 14. Follows all commands speech and language fluent  Cranial nerve II-XII: Pupils were equal round reactive to light extraocular movements were full, visual field were full on confrontational test. Facial  sensation and strength were normal. hearing was intact to finger rubbing bilaterally. Uvula tongue midline. head turning and shoulder shrug were normal and symmetric.Tongue protrusion into cheek strength was normal. Motor: normal bulk and tone, full strength in the BUE, BLE, fine finger movements normal, no pronator drift. No focal weakness Coordination: finger-nose-finger, heel-to-shin bilaterally, no dysmetria Reflexes: Brachioradialis 2/2, biceps 2/2, triceps 2/2, patellar 2/2, Achilles 2/2, plantar responses were flexor bilaterally. Gait and Station: Rising up from seated position without assistance, normal stance,  moderate stride, good arm swing, smooth turning, able to perform tiptoe, and heel walking without difficulty. Tandem gait is mildly unsteady  DIAGNOSTIC DATA (LABS, IMAGING, TESTING) -ASSESSMENT AND PLAN  80 y.o. year old female  has a past medical history of OSA (obstructive sleep apnea);; Memory disturbance; she returns for followup.  Pt will restart Aricept at 74m daily for 1 month then call for prescription for 136m Memory stable F/U in 6 months NaDennie BibleGNSaint Thomas Campus Surgicare LPBCStaten Island University Hospital - NorthAPRN  [facility] [address]SuOnsetrBreckenridgeNC [id number]

## 2013-02-12 NOTE — Progress Notes (Signed)
I have read the note, and I agree with the clinical assessment and plan.  Neyland Pettengill KEITH   

## 2013-02-24 ENCOUNTER — Other Ambulatory Visit: Payer: Self-pay | Admitting: Cardiology

## 2013-03-01 ENCOUNTER — Encounter: Payer: Self-pay | Admitting: Adult Health

## 2013-03-01 ENCOUNTER — Ambulatory Visit (INDEPENDENT_AMBULATORY_CARE_PROVIDER_SITE_OTHER): Payer: Medicare Other | Admitting: Adult Health

## 2013-03-01 VITALS — BP 142/68 | HR 60 | Temp 97.3°F | Ht 61.0 in | Wt 172.0 lb

## 2013-03-01 DIAGNOSIS — J42 Unspecified chronic bronchitis: Secondary | ICD-10-CM | POA: Diagnosis not present

## 2013-03-01 MED ORDER — AZITHROMYCIN 250 MG PO TABS: ORAL_TABLET | ORAL | Status: AC

## 2013-03-01 NOTE — Assessment & Plan Note (Signed)
Zpack take as directed.  Robitussin DM As needed   Fluids and rest  Tylenol As needed   Please contact office for sooner follow up if symptoms do not improve or worsen or seek emergency care

## 2013-03-01 NOTE — Progress Notes (Signed)
Subjective:    Patient ID: [name], female    DOB: Mar 27, 1932, 80 y.o.   MRN: [id number]  HPI 80 y/o WF   03/01/2013 Acute OV  Complains of prod cough with thick green/brown mucus, head congestion with same colored mucus x1 week, low grade temp at onset.  denies wheezing, dyspnea, chest tightness, hemoptysis, ear congestion, nausea, vomiting Taking Robitussin with only minimal help.  Patient complains that her cough and congestion are not improving. She is coughing up thick, brown green mucus. 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 ~  She continues periodic follow up by DrShockley [facility]), DrFowler [facility]), DrMurray ([facility])... 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  Hx of ENT Problem>  left nasal soft tissue abscess drained by DrBates- MRSA treated w/ drainage, IV antibiotics, then Doxy... she uses Claritin & Flonase for sinuses.  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  ~  CT Chest 3/10 (at [facility]) showed atherosclerotic dis in Ao & coronaries, calcif AoV, left lung apex extra-pleural mass ~2cm, 59m LUL nodule, scat ground-glass opac, atelec, marked biliary ductal dil- consider MRCP... (Old films and CT Chest here 6/07 & 10/09 compared- not mentioned on 2007 scan but the 2009 scan noted lesion left apex 2.6 x 2.0cm <sl larger than 2007 scan in retrospect> ?neurogenic/ nerve sheath tumor). ~  Lesion not mentioned on routine CXRs> CXR 5/12 norm heart, clear lungs, NAD..Marland Kitchen ~  CXR 12/13 is  clear- normal heart size, clear lungs- no change in subtle medial left apical lesion better delineated on old CTscans... ~  CXR 12/14 showed normal heart size, clear lungs, unchanged medial left apical extrapleural lesion, NAD...   OBSTRUCTIVE SLEEP APNEA  - Hx mild OSA on Sleep Study by DrDohmeier... she never really used the CPAP and states that "now I'm fine off of it"... she does have some insomnia & uses BENEDRYL Prn.  HYPERTENSION (ICD-401.9) - controlled on COREG 216mid...  ~  11/12:  BP=122/60 and pressures are good at home etc;  denies HA, visual changes, CP, palipit, dizziness, dyspnea, edema, etc; she has chronic fatigue symptoms. ~  5/13:  BP= 124/68 & she remains largely asymptomatic... ~  12/14: on Coreg25Bid; BP= 122/60 & she denies CP, palpit, ch in SOB, edema, etc...  CORONARY ARTERY DISEASE (ICD-414.00) - on COREG 2560md, & supposed to be on ASA 13m49m.. non-obstructive disease w/ cath 9/06 showing 30% LAD lesion, good LVF... neg Myoview 3/06 no ischemia, EF=73%... she was prev followed by DrBensimhon for Cards. ~  10/13: she saw DrHochrein- HBP, CAD, risk factors, hx AFlutter- s/p ablation; EKG showed NSR, rate66, wnl, NAD; doing well w/o CV complaints, not exercising & advised to incr exercise program... ~  12/14: on ASA81; Hx non-obstructive dis w/ cath 2006 showing 30%LAD lesion & good LVF; followed by DrBensimhon/ Hochrein for Cards...  Hx of ATRIAL FLUTTER (ICD-427.32) - S/P catheter ablation in 2005 by DrKlein.  CEREBROVASCULAR DISEASE (ICD-437.9) - eval by DrWillis 5/09 and DrBritz (Neurosurg @  [facility]) confirms 80% left middle cerebral art stenosis which is felt to be asymptomatic... they advise ASA 7m/d, and secondary risk factor modification...  HYPERLIPIDEMIA (ICD-272.4) - now on LIPITOR 460md + FISH OIL Bid...  ~  FLRavensdale0/08 on Vytor10/80 showed TChol 130, TG 139, HDL 38, LDL 64... ~  FLThunderbolt/09 on Vytor10/80 showed TChol 138, TG 102, HDL 62, LDL 56... ~  Vy[place]ate[place]~  Jan10: DrBensimhon started Lipitor 40/d. ~  FLP 6/10 on Lip40 showed TChol 148, TG 94, HDL 67, LDL 62 ~  FLP 6/11 on Lip40 showed TChol 186, TG 76, HDL 82, LDL 88 ~  FLP 5/12 on Lip40 showed TChol 156, TG 110, HDL 81, LDL 53 ~  FLP 6/13 on Lip40 showed TChol 142, TG 93, HDL 79, LDL 45 ~  6/14:  She wants to stop the Lipitor & take natural supplements from [employer] [facility] food store... ~  FLTajique/14 on diet supplements showed TChol 275, TG 113, HDL 69, LDL 189; she decided to restart Lip40 & CoQ10...  OBESITY (ICD-278.00) - she notes that her fibromyalgia makes it difficult for her to exercise. ~  weight 8/09 = 221# ~  weight 12/09 = 197# ~  weight 4/10 = 169#... great job w/ diet program. ~  weight 6/11 = 173# ~  weight 11/11 = 190#... we discussed diet + exercise. ~  Weight 5/12 = 189# ~  Weight 5/13 = 182# ~  Weight 6/14 = 175# ~  Weight 12/14 = 168#       << GI -  MANAGED BY DrJHAYES >> GASTROESOPHAGEAL REFLUX DISEASE (ICD-530.81) - followed by DrJHayes for GI on NEXIUM 40Bid... she has GERD, Hx gastric ulcer. DIVERTICULOSIS OF COLON (ICD-562.10) - hx diverticulitis in past... She takes PROBIOTIC & SIMETHACONE... IRRITABLE BOWEL SYNDROME (ICD-564.1) - DrHayes' eval w/ colon reported to show "ulcers" c/w Crohn's dis... COLONIC POLYPS - Hx of divertics w/ diverticulitis in the past and IBS, and hx of Colon Polyps w/ prev colonoscopy in 4/06... [facility]/ ?Crohn's... CROHN'S DISEASE (ICD-555.9) - on LIALDA 1.2gm- 3 tabs daily, QUESTRAN 2 scoops daily, & IMMODIUM Prn...       << ENDOCRINE - Prev MANAGED BY DrBALAN >> HYPERPARATHYROIDISM, HX OF (ICD-V12.2) - she had hypercalcemia w/ right inferior parathyroid gland resected 11/07 by DrGerkin... HYPOTHYROIDISM (ICD-244.9) - on SYNTHROID 8873mdaily... prev followed by DrBalan. ~  labs 6/07 showed TSH= 5.16... managed by DrBalan at that time. ~   labs here 7/10 on Levothy112 showed TSH= 0.16... rec> decr to 1/2 tab & f/u lab. ~  labs here 10/10 showed TSH= 10.04... rec> change to 21m33m. ~  labs here 6/11 on Levothy75 showed TSH= 6.84... rec> step up to 88mc57m ~  Labs 5/12 on Levothy88 showed TSH= 13.14... rec to incr to 100mcg39mshe never did)... ~  Labs 11/12 on Levo88 showed TSH= 2.63... Continue same. ~  Labs 5/13 on Levo88 showed TSH= 3.10 ~  Labs 1/14 on Levo88 showed TSH= 9.38 & we will incr to Levothy100mcg/81m ~  Labs 7/14 on Levo100 showed TSH= 3.89       << RHEUM - MAMAGED BY DrDEVESHWAR >> FIBROMYALGIA (ICD-729.1) - followed by DrDeveshwar... she uses TRAMADOL & LIDODERM patches for pain. OSTEOPENIA (ICD-733.90) - on FOSAMAX 70mg/wk3m++, vits... prev followed by DrBalan... ~  last BMD 9/08 showed TScores -0.8 in Spine & -2.3 in right FemNeck... ~  f/u  BMD 10/10 showed TScores invalid in Spine, & -2.2 in right FemNeck... continue Rx. ~  BMD here 12/12 showed TScores+0.8 in spine (osteophytes & scoliosis), and -2.1 in left FemNeck... Continue same. ~  Nuclear Bone Scan 2/13 showed areas of degenerative uptake in shoulders, sternoclavic joints, lower lum spine, bilat hips & knees & feet... ~  2/13: XRay Lumbar spine showed mild scoliosis, mod degen changes at L3-4 & L4-5, vasc calcif also seen...  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  SYNCOPE (ICD-780.2) - after a 2009 trip to visit family in [place]- she was flying home to [place] and passed out on the plane... she was taken to a hosp outside of [place] and adm for several days... ~ syncopal spell "altered level of consciusness" she remembers being on the plane and next in the ER...  their DC note indicates poss seizure but nothing to substantiate this (no seiz activ noted by anyone)...  ~  MRI Brain 2009 was neg x  max sinusitis... MRA however showed 80% stenosis in the left MCA... ~  CDoppler 2009 showed mild  plaque, norm waveforms, no signif stenoses... ~  2DEcho 2009 showed basically WNL, borderline conc LVH, EF=55-60%... ~  Eval by DrWillis for Memory & brief dizzy spell/ near syncope 2014> Epic note reviewed> children concerned about her memory (pt denies prob- but MMSE was 26/30 & animal fluency just 9 in [place]), pt c/o chronic insomnia & fatigue, had 1 episode of dizziness/ near syncope (?postural episode); they recommended conservative approach & careful follow up... ~  MRI Brain 2/14 by [facility] showed mild chronic microvasc ischemia, small developmental venous anomaly in left frontal area, no infarcts, no lesions...   RESTLESS LEG SYNDROME (ICD-333.94) - she's been on MIRAPEX 0.20m Qd... ~  10/10: c/o incr symptoms and instructed to incr Mirapex to 2 tabs...  PSYCHOSIS (ICD-298.9) - this was attributed to Prednisone therapy for the Crohn's dis... [place] and changed by Psychiatrists... [facility] the [facility] Depakote & Keppra... & she is also off the prev Zoloft & Restoril...   Past Surgical History  Procedure Laterality Date  . Parathyroid adenoma surgery    . Cholecystectomy    . Moh';s surg left lower eye lid surgery and lid reconstruction surgery  2/10  . Bladder suspension    . Cataract extraction Bilateral   . Appendectomy    . Tonsillectomy and adenoidectomy    . Lumbar laminectomy    . Incision and drainage perirectal abscess    . Heel spur excision    . Nasal sinus surgery Right     Right maxillary  . Eye lid surgery  04/04/2011    [facility] by Dr. ANorlene Duel   Outpatient Encounter Prescriptions as of 03/01/2013  Medication Sig  . aspirin 81 MG tablet Take 81 mg by mouth daily.    .Marland Kitchenatorvastatin (LIPITOR) 40 MG tablet Take 1 tablet (40 mg total) by mouth daily.  . Biotin 5000 MCG TABS Take 1 tablet by mouth daily.    . Calcium-Magnesium 500-250 MG TABS Take 2 tablets by mouth daily.  . carvedilol (COREG) 25 MG  tablet TAKE 1 TABLET TWICE A DAY WITH MEALS  . Cholecalciferol (VITAMIN D3) 2000 UNITS TABS Take 1 tablet by mouth daily.  . cholestyramine (QUESTRAN) 4 GM/DOSE powder 2 scoops per day   . co-enzyme Q-10 50 MG capsule Take  50 mg by mouth daily.  Marland Kitchen donepezil (ARICEPT) 5 MG tablet Take 1 tablet (5 mg total) by mouth daily.  . Evening Primrose Oil 500 MG CAPS 1 tablet twice a day  . fish oil-omega-3 fatty acids 1000 MG capsule 3 capsules daily  . fluticasone (FLONASE) 50 MCG/ACT nasal spray Place 2 sprays into the nose daily.  Marland Kitchen guaifenesin (HUMIBID E) 400 MG TABS 1 TABLET BY MOUTH TWO TIMES DAILY  . levothyroxine (SYNTHROID, LEVOTHROID) 100 MCG tablet TAKE 1 TABLET DAILY  . lidocaine (LIDODERM) 5 % Apply as directed  . loperamide (IMODIUM A-D) 2 MG tablet As needed  . meclizine (ANTIVERT) 25 MG tablet Take 1 tablet (25 mg total) by mouth 3 (three) times daily as needed. Take 1 tablet every 6 hours as needed  . mesalamine (LIALDA) 1.2 G EC tablet Take 3 tablets by mouth once a day as directed by Dr. Amedeo Plenty  . MIRAPEX 0.5 MG tablet TAKE 1 TABLET TWO TIMES DAILY  . Multiple Vitamins-Minerals (SUPER VITA-MINS PO) Take by mouth daily. Daily at noon  . PROBIOTIC CAPS Take 1 capsule by mouth daily.  . traMADol (ULTRAM) 50 MG tablet Take 1 tablet (50 mg total) by mouth every 8 (eight) hours as needed for pain.  . vitamin C (ASCORBIC ACID) 500 MG tablet Take 500 mg by mouth daily.  . [DISCONTINUED] levothyroxine (SYNTHROID, LEVOTHROID) 100 MCG tablet Take 88 mcg by mouth daily.    Allergies  Allergen Reactions  . Bacitracin     REACTION: redness, rash, severe itching  . Carbamazepine     Intolerant to Tegretol.  REACTION: dizziness, blurred vision, nausea, HA, insomnia  . Codeine     REACTION: nause and vomiting  . Diclofenac Sodium     REACTION: severe headache  . Fentanyl     REACTION: chest pain, HBP, with second patch experienced vertigo and nausea  . Fluoxetine Hcl     REACTION:  dizziness, blurred vision, nausea, HA, insomnia  . Hydromorphone Hcl     REACTION: intolerant to dilaudid with nausea, vomiting  . Indomethacin     REACTION: dizziness, blurred vision, nausea, HA, insomnia  . Lisinopril     REACTION: cough  . Meperidine Hcl     REACTION: intolerant to demerol with nausea, vomiting  . Morphine     REACTION: n/v  . Prednisone     REACTION: pt had psychiatric episode w/ hosp - lost memory x3weeks  . Pregabalin     REACTION: severe restless legs, insomnia  . Propranolol Hcl     Intolerant to Inderal.  REACTION: dizziness, blurred vision, nausea, HA, insomnia  . Refresh Lacri-Lube [Artificial Tears]     Caused and allergic reaction with redness, hard crust on eyes  . Ropinirole Hcl     REACTION: severe restless legs and insomnia  . Sulfamethoxazole     REACTION: nausea, gas, loose stools    Current Medications, Allergies, Past Medical History, Past Surgical History, Family History, and Social History were reviewed in [employer] record.     Review of Systems        See HPI - all other systems neg except as noted...  The patient denies anorexia, fever, weight loss, vision loss, decreased hearing, hoarseness, chest pain, syncope, peripheral edema, headaches, hemoptysis, abdominal pain, melena, hematochezia, severe indigestion/heartburn, hematuria, incontinence, suspicious skin lesions, transient blindness, difficulty walking, depression, unusual weight change, abnormal bleeding, enlarged lymph nodes, angioedema, and breast masses.    Objective:   Physical  Exam     WD, Overweight, 80 y/o WF in NAD...  GENERAL:  Alert & oriented; pleasant & cooperative... HEENT:  [place]/AT, EOM-wnl, EACs-clear, TMs-wnl, NOSE-clear, THROAT-clear & wnl.  << s/p mult surg left lower lid-  NECK:  Supple w/ fairROM; no JVD; normal carotid impulses w/o bruits; no thyromegaly or nodules palpated; no lymphadenopathy. CHEST:  Clear to P & A; without wheezes/  rales/ or rhonchi. HEART:  Regular Rhythm; gr 1/6 SEM without rubs/ or gallops. ABDOMEN:  Obese soft & nontender; normal bowel sounds; no organomegaly or masses detected. EXT: without deformities, mild arthritic changes; no varicose veins/ +venous insuffic/ no edema... NEURO:  ; no focal neuro deficits... DERM:  No lesions noted; no rash etc...     Assessment & Plan:

## 2013-03-01 NOTE — Patient Instructions (Signed)
Zpack take as directed.  Robitussin DM As needed   Fluids and rest  Tylenol As needed   Please contact office for sooner follow up if symptoms do not improve or worsen or seek emergency care  Dr. Lenna Gilford is retiring and we will need to set you up with a new Primary Care provider.  Please let us know if you would like [place] to assist with this referral.

## 2013-03-15 DIAGNOSIS — H16219 Exposure keratoconjunctivitis, unspecified eye: Secondary | ICD-10-CM | POA: Diagnosis not present

## 2013-03-15 DIAGNOSIS — H02059 Trichiasis without entropian unspecified eye, unspecified eyelid: Secondary | ICD-10-CM | POA: Diagnosis not present

## 2013-03-15 DIAGNOSIS — Z9889 Other specified postprocedural states: Secondary | ICD-10-CM | POA: Diagnosis not present

## 2013-04-01 ENCOUNTER — Encounter: Payer: Self-pay | Admitting: Family Medicine

## 2013-04-01 ENCOUNTER — Ambulatory Visit (INDEPENDENT_AMBULATORY_CARE_PROVIDER_SITE_OTHER): Payer: Medicare Other | Admitting: Family Medicine

## 2013-04-01 VITALS — BP 134/66 | HR 70 | Ht 61.0 in | Wt 174.0 lb

## 2013-04-01 DIAGNOSIS — D497 Neoplasm of unspecified behavior of endocrine glands and other parts of nervous system: Secondary | ICD-10-CM

## 2013-04-01 DIAGNOSIS — E039 Hypothyroidism, unspecified: Secondary | ICD-10-CM

## 2013-04-01 DIAGNOSIS — Z23 Encounter for immunization: Secondary | ICD-10-CM

## 2013-04-01 DIAGNOSIS — E669 Obesity, unspecified: Secondary | ICD-10-CM | POA: Insufficient documentation

## 2013-04-01 DIAGNOSIS — R233 Spontaneous ecchymoses: Secondary | ICD-10-CM

## 2013-04-01 DIAGNOSIS — G4700 Insomnia, unspecified: Secondary | ICD-10-CM

## 2013-04-01 DIAGNOSIS — K219 Gastro-esophageal reflux disease without esophagitis: Secondary | ICD-10-CM

## 2013-04-01 DIAGNOSIS — I4892 Unspecified atrial flutter: Secondary | ICD-10-CM

## 2013-04-01 DIAGNOSIS — E785 Hyperlipidemia, unspecified: Secondary | ICD-10-CM

## 2013-04-01 DIAGNOSIS — R238 Other skin changes: Secondary | ICD-10-CM

## 2013-04-01 DIAGNOSIS — I10 Essential (primary) hypertension: Secondary | ICD-10-CM | POA: Diagnosis not present

## 2013-04-01 LAB — BASIC METABOLIC PANEL
BUN: 17 mg/dL (ref 6–23)
CALCIUM: 9.5 mg/dL (ref 8.4–10.5)
CO2: 30 mEq/L (ref 19–32)
Chloride: 103 mEq/L (ref 96–112)
Creatinine, Ser: 0.7 mg/dL (ref 0.4–1.2)
GFR: 85.84 mL/min (ref >60.00)
GLUCOSE: 97 mg/dL (ref 70–99)
Potassium: 4.5 mEq/L (ref 3.5–5.1)
Sodium: 139 mEq/L (ref 135–145)

## 2013-04-01 LAB — CBC WITH DIFFERENTIAL/PLATELET
Basophils Absolute: 0 10*3/uL (ref 0.0–0.1)
Basophils Relative: 0.8 % (ref 0.0–3.0)
EOS ABS: 0.2 10*3/uL (ref 0.0–0.7)
Eosinophils Relative: 2.6 % (ref 0.0–5.0)
HCT: 37.5 % (ref 36.0–46.0)
HEMOGLOBIN: 12.5 g/dL (ref 12.0–15.0)
Lymphocytes Relative: 46.5 % — ABNORMAL HIGH (ref 12.0–46.0)
Lymphs Abs: 2.8 10*3/uL (ref 0.7–4.0)
MCHC: 33.4 g/dL (ref 30.0–36.0)
MCV: 95.3 fl (ref 78.0–100.0)
Monocytes Absolute: 0.5 10*3/uL (ref 0.1–1.0)
Monocytes Relative: 8.6 % (ref 3.0–12.0)
NEUTROS ABS: 2.5 10*3/uL (ref 1.4–7.7)
Neutrophils Relative %: 41.5 % — ABNORMAL LOW (ref 43.0–77.0)
Platelets: 145 10*3/uL — ABNORMAL LOW (ref 150.0–400.0)
RBC: 3.93 Mil/uL (ref 3.87–5.11)
RDW: 14.7 % — AB (ref 11.5–14.6)
WBC: 6 10*3/uL (ref 4.5–10.5)

## 2013-04-01 LAB — LIPID PANEL
Cholesterol: 183 mg/dL (ref 0–200)
HDL: 82.5 mg/dL (ref >39.00)
LDL Cholesterol: 75 mg/dL (ref 0–99)
Total CHOL/HDL Ratio: 2
Triglycerides: 130 mg/dL (ref 0.0–149.0)
VLDL: 26 mg/dL (ref 0.0–40.0)

## 2013-04-01 LAB — HEPATIC FUNCTION PANEL
ALBUMIN: 4 g/dL (ref 3.5–5.2)
ALK PHOS: 51 U/L (ref 39–117)
ALT: 22 U/L (ref 0–35)
AST: 28 U/L (ref 0–37)
Bilirubin, Direct: 0.1 mg/dL (ref 0.0–0.3)
Total Bilirubin: 0.6 mg/dL (ref 0.3–1.2)
Total Protein: 7.3 g/dL (ref 6.0–8.3)

## 2013-04-01 LAB — TSH: TSH: 0.21 u[IU]/mL — AB (ref 0.35–5.50)

## 2013-04-01 NOTE — Progress Notes (Signed)
Subjective:    Patient ID: [name], female    DOB: 01-27-1932, 80 y.o.   MRN: [id number]  HPI Patient is seen to establish care. She has multiple chronic problems including history of Merkel cell tumor left lower eyelid (followed at [facility]), hypothyroidism, hyperlipidemia, obesity, history of reported obstructive sleep apnea, restless leg syndrome, hypertension, history of nonobstructive CAD, GERD, Crohn's disease, IBS, fibromyalgia, osteopenia, history of reported hyperparathyroidism, and osteoarthritis. She's had some mild memory impairment and saw a neurologist and was briefly on Aricept but took herself off. Medications reviewed. Compliant with all. Denies recent chest pains. No dizziness. No recent falls.  She continues to battle insomnia issues. She frequently gets up and gets on the computer at night. No daytime napping. No regular alcohol use. No recent depression issues.  She complains of easy bruising mostly upper extremities. No other bleeding complications. She's had some chronic tearing of the left eye related to her multiple surgeries. She has frequent rhinorrhea and takes intermittent antihistamines and uses Flonase. No recent infectious symptoms.  Past Medical History  Diagnosis Date  . OSA (obstructive sleep apnea)   . History of bronchitis   . Other diseases of lung, not elsewhere classified   . Hypertension   . CAD (coronary artery disease)     LAD 30% 2006  . History of atrial flutter   . Cerebrovascular disease   . Hyperlipemia   . Obesity   . GERD (gastroesophageal reflux disease)   . Diverticulosis of colon   . IBS (irritable bowel syndrome)   . Colonic polyp   . Crohn's disease   . Hypothyroidism   . History of hyperparathyroidism   . Fibromyalgia   . Osteopenia   . History of headache   . Syncope   . Restless legs syndrome (RLS)   . Psychosis   . History of tuberculosis   . Radiation 05/2008    5000 cGy to left lower eyelid, parotid lymphatics and  upper neck  . Memory disturbance   . Bipolar affective     History of psychosis  . Merkel cell tumor     Left eyelid   Past Surgical History  Procedure Laterality Date  . Parathyroid adenoma surgery    . Cholecystectomy    . Moh';s surg left lower eye lid surgery and lid reconstruction surgery  2/10  . Bladder suspension    . Cataract extraction Bilateral   . Appendectomy    . Tonsillectomy and adenoidectomy    . Lumbar laminectomy    . Incision and drainage perirectal abscess    . Heel spur excision    . Nasal sinus surgery Right     Right maxillary  . Eye lid surgery  04/04/2011    [facility] by Dr. Norlene Duel    reports that she quit smoking about 53 years ago. Her smoking use included Cigarettes. She smoked 0.00 packs per day for 6 years. She has never used smokeless tobacco. She reports that she does not drink alcohol or use illicit drugs. family history includes Arthritis in her mother; Colon cancer in an other family member; Heart failure in her father; Hypertension in her mother; Pulmonary embolism in an other family member; Ulcers in her mother. Allergies  Allergen Reactions  . Bacitracin     REACTION: redness, rash, severe itching  . Carbamazepine     Intolerant to Tegretol.  REACTION: dizziness, blurred vision, nausea, HA, insomnia  . Codeine     REACTION: nause and vomiting  .  Diclofenac Sodium     REACTION: severe headache  . Fentanyl     REACTION: chest pain, HBP, with second patch experienced vertigo and nausea  . Fluoxetine Hcl     REACTION: dizziness, blurred vision, nausea, HA, insomnia  . Hydromorphone Hcl     REACTION: intolerant to dilaudid with nausea, vomiting  . Indomethacin     REACTION: dizziness, blurred vision, nausea, HA, insomnia  . Lisinopril     REACTION: cough  . Meperidine Hcl     REACTION: intolerant to demerol with nausea, vomiting  . Morphine     REACTION: n/v  . Prednisone     REACTION: pt had psychiatric episode w/ hosp - lost memory  x3weeks  . Pregabalin     REACTION: severe restless legs, insomnia  . Propranolol Hcl     Intolerant to Inderal.  REACTION: dizziness, blurred vision, nausea, HA, insomnia  . Refresh Lacri-Lube [Artificial Tears]     Caused and allergic reaction with redness, hard crust on eyes  . Ropinirole Hcl     REACTION: severe restless legs and insomnia  . Sulfamethoxazole     REACTION: nausea, gas, loose stools      Review of Systems  Constitutional: Positive for fatigue. Negative for appetite change and unexpected weight change.  Eyes: Negative for visual disturbance.  Respiratory: Negative for cough, chest tightness, shortness of breath and wheezing.   Cardiovascular: Negative for chest pain, palpitations and leg swelling.  Endocrine: Negative for polydipsia and polyuria.  Neurological: Negative for dizziness, seizures, syncope, weakness, light-headedness and headaches.  Hematological: Negative for adenopathy. Bruises/bleeds easily.  Psychiatric/Behavioral: Negative for confusion and dysphoric mood.       Objective:   Physical Exam  Constitutional: She is oriented to person, place, and time. She appears well-developed and well-nourished.  HENT:  Right Ear: External ear normal.  Left Ear: External ear normal.  Mouth/Throat: Oropharynx is clear and moist.  Eyes:  She's had fairly extensive surgery left lower lid.  Neck: Neck supple. No thyromegaly present.  Cardiovascular: Normal rate.   Pulmonary/Chest: Effort normal and breath sounds normal. No respiratory distress. She has no wheezes. She has no rales.  Musculoskeletal: She exhibits no edema.  Neurological: She is alert and oriented to person, place, and time. No cranial nerve deficit.  Skin:  Patient has some scattered bruises upper extremities  Psychiatric: She has a normal mood and affect. Her behavior is normal.          Assessment & Plan:  #1 history of reported nonobstructive CAD #2 easy bruising. Likely related to  age and aspirin use. Check CBC #3 hypothyroidism. Recheck TSH #4 chronic insomnia. Sleep hygiene discussed. Handout given.  Avoid medications, if possible. #5 hyperlipidemia. Check lipid and hepatic panel #6 history of Merkel cell tumor left lower eyelid. Follow [facility] at [facility] #7 remote history of atrial flutter status post ablation currently sinus rhythm clinically #8 history of Crohn's disease symptomatically stable. #9 restless leg syndrome controlled with Mirapex #10 history of GERD currently stable symptomatically #11 obesity.  Recommend weight loss.

## 2013-04-01 NOTE — Progress Notes (Signed)
Pre visit review using our clinic review tool, if applicable. No additional management support is needed unless otherwise documented below in the visit note. 

## 2013-04-01 NOTE — Patient Instructions (Signed)

## 2013-04-02 ENCOUNTER — Other Ambulatory Visit: Payer: Self-pay

## 2013-04-02 ENCOUNTER — Telehealth: Payer: Self-pay | Admitting: Family Medicine

## 2013-04-02 MED ORDER — LEVOTHYROXINE SODIUM 88 MCG PO TABS
88.0000 ug | ORAL_TABLET | Freq: Every day | ORAL | Status: DC
Start: 2013-04-02 — End: 2013-04-07

## 2013-04-02 NOTE — Telephone Encounter (Signed)
Relevant patient education assigned to patient using Emmi. ° °

## 2013-04-07 ENCOUNTER — Telehealth: Payer: Self-pay | Admitting: Family Medicine

## 2013-04-07 MED ORDER — LEVOTHYROXINE SODIUM 88 MCG PO TABS: 88.0000 ug | ORAL_TABLET | Freq: Every day | ORAL | Status: DC

## 2013-04-07 NOTE — Telephone Encounter (Signed)
[name] came in and wants his wife's prescription for levothyroxine (SYNTHROID, LEVOTHROID) 88 MCG tablet sent from [facility] in [place], [place] to [facility]. She has enough meds for about 10 days. Please process as soon as possible. Patient's contact number has been verified.  cj

## 2013-04-07 NOTE — Telephone Encounter (Signed)
RX sent to pharmacy  

## 2013-04-26 DIAGNOSIS — H16219 Exposure keratoconjunctivitis, unspecified eye: Secondary | ICD-10-CM | POA: Diagnosis not present

## 2013-04-26 DIAGNOSIS — H02059 Trichiasis without entropian unspecified eye, unspecified eyelid: Secondary | ICD-10-CM | POA: Diagnosis not present

## 2013-04-26 DIAGNOSIS — H02209 Unspecified lagophthalmos unspecified eye, unspecified eyelid: Secondary | ICD-10-CM | POA: Diagnosis not present

## 2013-05-08 ENCOUNTER — Other Ambulatory Visit: Payer: Self-pay | Admitting: Cardiology

## 2013-05-10 ENCOUNTER — Other Ambulatory Visit: Payer: Self-pay

## 2013-05-26 DIAGNOSIS — H16219 Exposure keratoconjunctivitis, unspecified eye: Secondary | ICD-10-CM | POA: Diagnosis not present

## 2013-06-02 ENCOUNTER — Encounter: Payer: Self-pay | Admitting: Family Medicine

## 2013-06-02 ENCOUNTER — Ambulatory Visit (INDEPENDENT_AMBULATORY_CARE_PROVIDER_SITE_OTHER): Payer: Medicare Other | Admitting: Family Medicine

## 2013-06-02 VITALS — BP 134/68 | HR 69 | Wt 174.0 lb

## 2013-06-02 DIAGNOSIS — M542 Cervicalgia: Secondary | ICD-10-CM

## 2013-06-02 MED ORDER — METHOCARBAMOL 500 MG PO TABS: 500.0000 mg | ORAL_TABLET | Freq: Four times a day (QID) | ORAL | Status: DC | PRN

## 2013-06-02 NOTE — Patient Instructions (Signed)
Follow up for any weakness, numbness or progressive pain.

## 2013-06-02 NOTE — Progress Notes (Signed)
Pre visit review using our clinic review tool, if applicable. No additional management support is needed unless otherwise documented below in the visit note. 

## 2013-06-02 NOTE — Progress Notes (Signed)
Subjective:    Patient ID: [name], female    DOB: 1932-05-13, 80 y.o.   MRN: [id number]  Neck Pain  Pertinent negatives include no chest pain, fever, headaches, numbness or weakness.    Acute visit for neck pain. Onset about one week ago. Right-sided neck pain the base of the neck with some radiation toward the right deltoid region. She denies prior history of neck difficulties. No specific injury. She describes a sharp to achy quality pain which is moderate in intensity. Worse with movement but not at rest. She has tried Lidoderm patch, Thera-Gesic, and topical heat without relief. Denies any numbness or weakness. She has multiple drug allergies and has not tolerated things like hydrocodone the past. She cannot take nonsteroidals or prednisone.  Past Medical History  Diagnosis Date  . OSA (obstructive sleep apnea)   . History of bronchitis   . Other diseases of lung, not elsewhere classified   . Hypertension   . CAD (coronary artery disease)     LAD 30% 2006  . History of atrial flutter   . Cerebrovascular disease   . Hyperlipemia   . Obesity   . GERD (gastroesophageal reflux disease)   . Diverticulosis of colon   . IBS (irritable bowel syndrome)   . Colonic polyp   . Crohn's disease   . Hypothyroidism   . History of hyperparathyroidism   . Fibromyalgia   . Osteopenia   . History of headache   . Syncope   . Restless legs syndrome (RLS)   . Psychosis   . History of tuberculosis   . Radiation 05/2008    5000 cGy to left lower eyelid, parotid lymphatics and upper neck  . Memory disturbance   . Bipolar affective     History of psychosis  . Merkel cell tumor     Left eyelid   Past Surgical History  Procedure Laterality Date  . Parathyroid adenoma surgery    . Cholecystectomy    . Moh';s surg left lower eye lid surgery and lid reconstruction surgery  2/10  . Bladder suspension    . Cataract extraction Bilateral   . Appendectomy    . Tonsillectomy and adenoidectomy     . Lumbar laminectomy    . Incision and drainage perirectal abscess    . Heel spur excision    . Nasal sinus surgery Right     Right maxillary  . Eye lid surgery  04/04/2011    [facility] by Dr. Norlene Duel    reports that she quit smoking about 53 years ago. Her smoking use included Cigarettes. She smoked 0.00 packs per day for 6 years. She has never used smokeless tobacco. She reports that she does not drink alcohol or use illicit drugs. family history includes Arthritis in her mother; Colon cancer in an other family member; Heart failure in her father; Hypertension in her mother; Pulmonary embolism in an other family member; Ulcers in her mother. Allergies  Allergen Reactions  . Bacitracin     REACTION: redness, rash, severe itching  . Carbamazepine     Intolerant to Tegretol.  REACTION: dizziness, blurred vision, nausea, HA, insomnia  . Codeine     REACTION: nause and vomiting  . Diclofenac Sodium     REACTION: severe headache  . Fentanyl     REACTION: chest pain, HBP, with second patch experienced vertigo and nausea  . Fluoxetine Hcl     REACTION: dizziness, blurred vision, nausea, HA, insomnia  . Hydromorphone Hcl  REACTION: intolerant to dilaudid with nausea, vomiting  . Indomethacin     REACTION: dizziness, blurred vision, nausea, HA, insomnia  . Lisinopril     REACTION: cough  . Meperidine Hcl     REACTION: intolerant to demerol with nausea, vomiting  . Morphine     REACTION: n/v  . Prednisone     REACTION: pt had psychiatric episode w/ hosp - lost memory x3weeks  . Pregabalin     REACTION: severe restless legs, insomnia  . Propranolol Hcl     Intolerant to Inderal.  REACTION: dizziness, blurred vision, nausea, HA, insomnia  . Refresh Lacri-Lube [Artificial Tears]     Caused and allergic reaction with redness, hard crust on eyes  . Ropinirole Hcl     REACTION: severe restless legs and insomnia  . Sulfamethoxazole     REACTION: nausea, gas, loose stools      Review of Systems  Constitutional: Negative for fever, chills, appetite change and unexpected weight change.  Respiratory: Negative for shortness of breath.   Cardiovascular: Negative for chest pain.  Musculoskeletal: Positive for neck pain.  Neurological: Negative for dizziness, weakness, numbness and headaches.  Hematological: Negative for adenopathy.       Objective:   Physical Exam  Constitutional: She appears well-developed and well-nourished.  HENT:  Right Ear: External ear normal.  Left Ear: External ear normal.  Mouth/Throat: Oropharynx is clear and moist.  Neck: Neck supple. No thyromegaly present.  Cardiovascular: Normal rate.   Pulmonary/Chest: Effort normal and breath sounds normal. No respiratory distress. She has no wheezes. She has no rales.  Musculoskeletal: She exhibits no edema.  Lymphadenopathy:    She has no cervical adenopathy.  Neurological:  Full-strength upper extremities. Normal sensory function touch. 2+ reflexes throughout upper extremities. She has very limited range of motion cervical spine with flexion, extension, lateral bending, or rotation to either side. She has some localized tenderness to palpation right trapezius muscles          Assessment & Plan:  Right cervical neck pain. She has some pain radiation which suggests probable impingement. She's tried topical heat without relief. She has intolerance to multiple medications. Cautious trial of Robaxin 500 mg every 6 hours when necessary muscle spasm. Continue topical heat. Followup promptly for any upper extremity numbness or weakness.

## 2013-06-07 ENCOUNTER — Other Ambulatory Visit: Payer: Self-pay | Admitting: Pulmonary Disease

## 2013-06-16 ENCOUNTER — Ambulatory Visit: Payer: Medicare Other | Admitting: Pulmonary Disease

## 2013-06-21 ENCOUNTER — Ambulatory Visit (INDEPENDENT_AMBULATORY_CARE_PROVIDER_SITE_OTHER): Payer: Medicare Other | Admitting: Family Medicine

## 2013-06-21 ENCOUNTER — Encounter: Payer: Self-pay | Admitting: Family Medicine

## 2013-06-21 VITALS — BP 138/90 | HR 81 | Wt 168.0 lb

## 2013-06-21 DIAGNOSIS — H8110 Benign paroxysmal vertigo, unspecified ear: Secondary | ICD-10-CM | POA: Diagnosis not present

## 2013-06-21 NOTE — Patient Instructions (Signed)

## 2013-06-21 NOTE — Progress Notes (Signed)
Subjective:    Patient ID: [name], female    DOB: 1932/12/09, 80 y.o.   MRN: [id number]  Dizziness Pertinent negatives include no chest pain, chills, fever, headaches or weakness.   Patient seen with dizziness. On further questioning, this is vertigo. Onset was on 06/12/2013 when she was with family at the beach. She said symptoms are worse first thing in the morning and do improve slightly. Symptoms are exacerbated by movement. She has not had any associated head injury, headaches, nausea, vomiting, or hearing changes. No focal weakness. She took some low-dose meclizine which helped minimally if any. She denies history of similar problems the past. No recent sudden hearing changes. No syncope. No confusion. No alleviating factors.  Past Medical History  Diagnosis Date  . OSA (obstructive sleep apnea)   . History of bronchitis   . Other diseases of lung, not elsewhere classified   . Hypertension   . CAD (coronary artery disease)     LAD 30% 2006  . History of atrial flutter   . Cerebrovascular disease   . Hyperlipemia   . Obesity   . GERD (gastroesophageal reflux disease)   . Diverticulosis of colon   . IBS (irritable bowel syndrome)   . Colonic polyp   . Crohn's disease   . Hypothyroidism   . History of hyperparathyroidism   . Fibromyalgia   . Osteopenia   . History of headache   . Syncope   . Restless legs syndrome (RLS)   . Psychosis   . History of tuberculosis   . Radiation 05/2008    5000 cGy to left lower eyelid, parotid lymphatics and upper neck  . Memory disturbance   . Bipolar affective     History of psychosis  . Merkel cell tumor     Left eyelid   Past Surgical History  Procedure Laterality Date  . Parathyroid adenoma surgery    . Cholecystectomy    . Moh';s surg left lower eye lid surgery and lid reconstruction surgery  2/10  . Bladder suspension    . Cataract extraction Bilateral   . Appendectomy    . Tonsillectomy and adenoidectomy    . Lumbar  laminectomy    . Incision and drainage perirectal abscess    . Heel spur excision    . Nasal sinus surgery Right     Right maxillary  . Eye lid surgery  04/04/2011    [facility] by Dr. Norlene Duel    reports that she quit smoking about 53 years ago. Her smoking use included Cigarettes. She smoked 0.00 packs per day for 6 years. She has never used smokeless tobacco. She reports that she does not drink alcohol or use illicit drugs. family history includes Arthritis in her mother; Colon cancer in an other family member; Heart failure in her father; Hypertension in her mother; Pulmonary embolism in an other family member; Ulcers in her mother. Allergies  Allergen Reactions  . Bacitracin     REACTION: redness, rash, severe itching  . Carbamazepine     Intolerant to Tegretol.  REACTION: dizziness, blurred vision, nausea, HA, insomnia  . Codeine     REACTION: nause and vomiting  . Diclofenac Sodium     REACTION: severe headache  . Fentanyl     REACTION: chest pain, HBP, with second patch experienced vertigo and nausea  . Fluoxetine Hcl     REACTION: dizziness, blurred vision, nausea, HA, insomnia  . Hydromorphone Hcl     REACTION: intolerant to  dilaudid with nausea, vomiting  . Indomethacin     REACTION: dizziness, blurred vision, nausea, HA, insomnia  . Lisinopril     REACTION: cough  . Meperidine Hcl     REACTION: intolerant to demerol with nausea, vomiting  . Morphine     REACTION: n/v  . Prednisone     REACTION: pt had psychiatric episode w/ hosp - lost memory x3weeks  . Pregabalin     REACTION: severe restless legs, insomnia  . Propranolol Hcl     Intolerant to Inderal.  REACTION: dizziness, blurred vision, nausea, HA, insomnia  . Refresh Lacri-Lube [Artificial Tears]     Caused and allergic reaction with redness, hard crust on eyes  . Ropinirole Hcl     REACTION: severe restless legs and insomnia  . Sulfamethoxazole     REACTION: nausea, gas, loose stools      Review of  Systems  Constitutional: Negative for fever and chills.  HENT: Negative for ear pain, hearing loss and trouble swallowing.   Eyes: Negative for visual disturbance.  Respiratory: Negative for shortness of breath.   Cardiovascular: Negative for chest pain.  Neurological: Positive for dizziness. Negative for seizures, syncope, weakness and headaches.  Psychiatric/Behavioral: Negative for confusion.       Objective:   Physical Exam  Constitutional: She is oriented to person, place, and time. She appears well-developed and well-nourished.  HENT:  Right Ear: External ear normal.  Left Ear: External ear normal.  Neck: Neck supple.  No carotid bruits  Cardiovascular: Normal rate and regular rhythm.   Pulmonary/Chest: Breath sounds normal. No respiratory distress. She has no wheezes. She has no rales.  Neurological: She is alert and oriented to person, place, and time. No cranial nerve deficit.  No focal weakness. Normal cerebellar function by finger to nose testing. She does have some horizontal nystagmus with sitting to supine. No vertical nystagmus  Psychiatric: She has a normal mood and affect. Her behavior is normal.          Assessment & Plan:  Vertigo. Suspect benign peripheral positional vertigo. She's had symptoms now for about 2 weeks. Set up consultation with vestibular rehabilitation. Handout given. We explained that medications are not usually very helpful in treating this

## 2013-06-21 NOTE — Progress Notes (Signed)
Pre visit review using our clinic review tool, if applicable. No additional management support is needed unless otherwise documented below in the visit note. 

## 2013-06-28 ENCOUNTER — Other Ambulatory Visit: Payer: Medicare Other

## 2013-06-28 ENCOUNTER — Encounter: Payer: Self-pay | Admitting: Family Medicine

## 2013-06-28 ENCOUNTER — Other Ambulatory Visit (INDEPENDENT_AMBULATORY_CARE_PROVIDER_SITE_OTHER): Payer: Medicare Other

## 2013-06-28 DIAGNOSIS — E039 Hypothyroidism, unspecified: Secondary | ICD-10-CM

## 2013-06-28 LAB — TSH: TSH: 1.25 u[IU]/mL (ref 0.35–4.50)

## 2013-07-07 ENCOUNTER — Other Ambulatory Visit: Payer: Self-pay | Admitting: Pulmonary Disease

## 2013-07-08 DIAGNOSIS — H8110 Benign paroxysmal vertigo, unspecified ear: Secondary | ICD-10-CM | POA: Diagnosis not present

## 2013-07-21 ENCOUNTER — Other Ambulatory Visit: Payer: Self-pay | Admitting: Cardiology

## 2013-07-22 DIAGNOSIS — H8110 Benign paroxysmal vertigo, unspecified ear: Secondary | ICD-10-CM | POA: Diagnosis not present

## 2013-07-28 ENCOUNTER — Other Ambulatory Visit: Payer: Self-pay | Admitting: Family Medicine

## 2013-07-29 DIAGNOSIS — H8110 Benign paroxysmal vertigo, unspecified ear: Secondary | ICD-10-CM | POA: Diagnosis not present

## 2013-08-11 DIAGNOSIS — Z01419 Encounter for gynecological examination (general) (routine) without abnormal findings: Secondary | ICD-10-CM | POA: Diagnosis not present

## 2013-08-11 DIAGNOSIS — Z1231 Encounter for screening mammogram for malignant neoplasm of breast: Secondary | ICD-10-CM | POA: Diagnosis not present

## 2013-08-11 DIAGNOSIS — Z1212 Encounter for screening for malignant neoplasm of rectum: Secondary | ICD-10-CM | POA: Diagnosis not present

## 2013-08-12 ENCOUNTER — Ambulatory Visit (INDEPENDENT_AMBULATORY_CARE_PROVIDER_SITE_OTHER): Payer: Medicare Other | Admitting: Nurse Practitioner

## 2013-08-12 ENCOUNTER — Encounter: Payer: Self-pay | Admitting: Nurse Practitioner

## 2013-08-12 VITALS — BP 112/58 | HR 60 | Ht 61.25 in | Wt 170.0 lb

## 2013-08-12 DIAGNOSIS — R413 Other amnesia: Secondary | ICD-10-CM

## 2013-08-12 DIAGNOSIS — G4733 Obstructive sleep apnea (adult) (pediatric): Secondary | ICD-10-CM

## 2013-08-12 MED ORDER — DONEPEZIL HCL 5 MG PO TABS: ORAL_TABLET | ORAL | Status: DC

## 2013-08-12 NOTE — Progress Notes (Signed)
[facility]  PATIENT: [name] DOB: Dec 22, 1932   REASON FOR VISIT: follow up for memory loss   HISTORY OF PRESENT ILLNESS:[name], 80 year old female returns for followup. She was evaluated for memory loss by Dr. Jannifer Hayes 08/12/2012. Her MRI of the brain showed minimal small vessel disease and her dementia labs were normal. She was placed on Aricept 5 mg and was to call in one month if she was tolerating the medication. She did not follow the instructions and her Aricept was restarted at her last visit however once again she only took the medication for a month.She reports her memory is about the same. She continues to perform her activities of daily living as well as cooks. Her husband does the finances. She returns for reevaluation. She does not remember if she has side effects to Aricept or not .   HISTORY: progressive memory disturbance that has been present for about a year. The patient has not noted any significant cognitive changes since last seen. The patient does not operate a motor vehicle. The patient overall has been doing well. The patient does not sleep well at night, but she generally has good energy levels during the day. The patient is interested in getting on a medication to help slow down progression of memory. MRI evaluation done previously shows minimal small vessel disease, and blood work was unremarkable.      REVIEW OF SYSTEMS: Full 14 system review of systems performed and notable only for those listed, all others are neg:  Constitutional: N/A  Cardiovascular: N/A  Ear/Nose/Throat: N/A  Skin: N/A  Eyes: N/A  Respiratory: N/A  Gastroitestinal: N/A  Hematology/Lymphatic: N/A  Endocrine: N/A Musculoskeletal:N/A  Allergy/Immunology: N/A  Neurological: Memory loss Psychiatric: N/A Sleep : NA   ALLERGIES: Allergies  Allergen Reactions  . Bacitracin     REACTION: redness, rash, severe itching  . Carbamazepine     Intolerant to Tegretol.   REACTION: dizziness, blurred vision, nausea, HA, insomnia  . Codeine     REACTION: nause and vomiting  . Diclofenac Sodium     REACTION: severe headache  . Fentanyl     REACTION: chest pain, HBP, with second patch experienced vertigo and nausea  . Fluoxetine Hcl     REACTION: dizziness, blurred vision, nausea, HA, insomnia  . Hydromorphone Hcl     REACTION: intolerant to dilaudid with nausea, vomiting  . Indomethacin     REACTION: dizziness, blurred vision, nausea, HA, insomnia  . Lisinopril     REACTION: cough  . Meperidine Hcl     REACTION: intolerant to demerol with nausea, vomiting  . Morphine     REACTION: n/v  . Prednisone     REACTION: pt had psychiatric episode w/ hosp - lost memory x3weeks  . Pregabalin     REACTION: severe restless legs, insomnia  . Propranolol Hcl     Intolerant to Inderal.  REACTION: dizziness, blurred vision, nausea, HA, insomnia  . Refresh Lacri-Lube [Artificial Tears]     Caused and allergic reaction with redness, hard crust on eyes  . Ropinirole Hcl     REACTION: severe restless legs and insomnia  . Sulfamethoxazole     REACTION: nausea, gas, loose stools    HOME MEDICATIONS: Outpatient Prescriptions Prior to Visit  Medication Sig Dispense Refill  . aspirin 81 MG tablet Take 81 mg by mouth daily.        Marland Kitchen atorvastatin (LIPITOR) 40 MG tablet Take 1 tablet (40 mg total) by mouth  daily.  90 tablet  3  . Biotin 5000 MCG TABS Take 1 tablet by mouth daily.        . Calcium-Magnesium 500-250 MG TABS Take 2 tablets by mouth daily.      . carvedilol (COREG) 25 MG tablet TAKE 1 TABLET TWICE A DAY WITH MEALS. NEED TO CONTACT OFFICE TO SCHEDULE APPOINTMENT FOR FUTURE REFILLS [phone].  180 tablet  0  . Cholecalciferol (VITAMIN D3) 2000 UNITS TABS Take 1 tablet by mouth daily.      . cholestyramine (QUESTRAN) 4 GM/DOSE powder 2 scoops per day       . co-enzyme Q-10 50 MG capsule Take 50 mg by mouth daily.      . Evening Primrose Oil 500 MG CAPS 1  tablet twice a day      . fish oil-omega-3 fatty acids 1000 MG capsule 3 capsules daily      . fluticasone (FLONASE) 50 MCG/ACT nasal spray USE 2 SPRAYS NASALLY DAILY  16 g  2  . guaifenesin (HUMIBID E) 400 MG TABS 1 TABLET BY MOUTH TWO TIMES DAILY      . levothyroxine (SYNTHROID, LEVOTHROID) 88 MCG tablet Take 1 tablet (88 mcg total) by mouth daily before breakfast.  90 tablet  3  . lidocaine (LIDODERM) 5 % USE 1-2 PATCHES TO AFFECTED AREA AS NEEDED - 12 HRS ON AND 12 HRS OFF  60 patch  0  . loperamide (IMODIUM A-D) 2 MG tablet As needed      . meclizine (ANTIVERT) 25 MG tablet Take 1 tablet (25 mg total) by mouth 3 (three) times daily as needed. Take 1 tablet every 6 hours as needed  30 tablet  5  . mesalamine (LIALDA) 1.2 G EC tablet Take 3 tablets by mouth once a day as directed by Dr. Amedeo Plenty  270 tablet  1  . MIRAPEX 0.5 MG tablet TAKE 1 TABLET TWO TIMES DAILY  180 tablet  2  . Multiple Vitamins-Minerals (SUPER VITA-MINS PO) Take by mouth daily. Daily at noon      . PROBIOTIC CAPS Take 1 capsule by mouth daily.      . traMADol (ULTRAM) 50 MG tablet Take 1 tablet (50 mg total) by mouth every 8 (eight) hours as needed for pain.  270 tablet  1  . vitamin C (ASCORBIC ACID) 500 MG tablet Take 500 mg by mouth daily.      . methocarbamol (ROBAXIN) 500 MG tablet Take 1 tablet (500 mg total) by mouth every 6 (six) hours as needed for muscle spasms.  30 tablet  0   No facility-administered medications prior to visit.    PAST MEDICAL HISTORY: Past Medical History  Diagnosis Date  . OSA (obstructive sleep apnea)   . History of bronchitis   . Other diseases of lung, not elsewhere classified   . Hypertension   . CAD (coronary artery disease)     LAD 30% 2006  . History of atrial flutter   . Cerebrovascular disease   . Hyperlipemia   . Obesity   . GERD (gastroesophageal reflux disease)   . Diverticulosis of colon   . IBS (irritable bowel syndrome)   . Colonic polyp   . Crohn's disease   .  Hypothyroidism   . History of hyperparathyroidism   . Fibromyalgia   . Osteopenia   . History of headache   . Syncope   . Restless legs syndrome (RLS)   . Psychosis   . History of tuberculosis   .  Radiation 05/2008    5000 cGy to left lower eyelid, parotid lymphatics and upper neck  . Memory disturbance   . Bipolar affective     History of psychosis  . Merkel cell tumor     Left eyelid    PAST SURGICAL HISTORY: Past Surgical History  Procedure Laterality Date  . Parathyroid adenoma surgery    . Cholecystectomy    . Moh';s surg left lower eye lid surgery and lid reconstruction surgery  2/10  . Bladder suspension    . Cataract extraction Bilateral   . Appendectomy    . Tonsillectomy and adenoidectomy    . Lumbar laminectomy    . Incision and drainage perirectal abscess    . Heel spur excision    . Nasal sinus surgery Right     Right maxillary  . Eye lid surgery  04/04/2011    [facility] by Dr. Norlene Duel    FAMILY HISTORY: Family History  Problem Relation Age of Onset  . Colon cancer      grandmother  . Arthritis Mother   . Hypertension Mother   . Ulcers Mother   . Heart failure Father   . Pulmonary embolism      SOCIAL HISTORY: History   Social History  . Marital Status: Married    Spouse Name: Joe    Number of Children: 2  . Years of Education: N/A   Occupational History  .     Social History Main Topics  . Smoking status: Former Smoker -- 6 years    Types: Cigarettes    Quit date: 01/08/1960  . Smokeless tobacco: Never Used  . Alcohol Use: No  . Drug Use: No  . Sexual Activity: Not on file   Other Topics Concern  . Not on file   Social History Narrative   Patient is married with 2 children   Patient is right handed   Patient is a retired [employer] scientist   Patient does not drink any caffeine.     PHYSICAL EXAM  Filed Vitals:   08/12/13 0916  BP: 112/58  Pulse: 60  Height: 5' 1.25" (1.556 m)  Weight: 170 lb (77.111 kg)   Body mass index is 31.85  kg/(m^2). Generalized: Well developed, in no acute distress, well groomed  Neurological examination  Mentation: Alert MMSE 23/30 missing items in orientation, calculation and 2 of 3 recall.AFT 13. Follows all commands speech and language fluent  Cranial nerve II-XII: Pupils were equal round reactive to light extraocular movements were full, visual field were full on confrontational test. Facial sensation and strength were normal. hearing was intact to finger rubbing bilaterally. Uvula tongue midline. head turning and shoulder shrug were normal and symmetric.Tongue protrusion into cheek strength was normal.  Motor: normal bulk and tone, full strength in the BUE, BLE, fine finger movements normal, no pronator drift. No focal weakness  Coordination: finger-nose-finger, heel-to-shin bilaterally, no dysmetria  Reflexes: Brachioradialis 2/2, biceps 2/2, triceps 2/2, patellar 2/2, Achilles 2/2, plantar responses were flexor bilaterally.  Gait and Station: Rising up from seated position without assistance, normal stance, moderate stride, good arm swing, smooth turning, able to perform tiptoe, and heel walking without difficulty. Tandem gait is mildly unsteady. No assistive device   DIAGNOSTIC DATA (LABS, IMAGING, TESTING) - I reviewed patient records, labs, notes, testing and imaging myself where available.  Lab Results  Component Value Date   WBC 6.0 04/01/2013   HGB 12.5 04/01/2013   HCT 37.5 04/01/2013   MCV 95.3 04/01/2013  PLT 145.0* 04/01/2013      Component Value Date/Time   NA 139 04/01/2013 1051   K 4.5 04/01/2013 1051   CL 103 04/01/2013 1051   CO2 30 04/01/2013 1051   GLUCOSE 97 04/01/2013 1051   BUN 17 04/01/2013 1051   CREATININE 0.7 04/01/2013 1051   CALCIUM 9.5 04/01/2013 1051   PROT 7.3 04/01/2013 1051   ALBUMIN 4.0 04/01/2013 1051   AST 28 04/01/2013 1051   ALT 22 04/01/2013 1051   ALKPHOS 51 04/01/2013 1051   BILITOT 0.6 04/01/2013 1051   GFRNONAA 81.32 06/08/2009 1113   GFRAA  Value:  >60        The eGFR has been calculated using the MDRD equation. This calculation has not been validated in all clinical situations. eGFR's persistently <60 mL/min signify possible Chronic Kidney Disease. 11/03/2008 1600   Lab Results  Component Value Date   CHOL 183 04/01/2013   HDL 82.50 04/01/2013   LDLCALC 75 04/01/2013   LDLDIRECT 188.6 07/13/2012   TRIG 130.0 04/01/2013   CHOLHDL 2 04/01/2013     Lab Results  Component Value Date   TSH 1.25 06/28/2013      ASSESSMENT AND PLAN  80 y.o. year old female  has a past medical history of OSA (obstructive sleep apnea); memory disturbance here for followup. She only took the Aricept for one month after last visit. She is a patient of Dr. Jannifer Hayes who is out of the office today.  Restart Aricept at 71m daily for 1 month then increase to 2 tabs daily Will call in to local pharmacy F/U in 6 months, [facility] [address] [place] [phone]

## 2013-08-12 NOTE — Progress Notes (Signed)
I agree with the above plan 

## 2013-08-12 NOTE — Patient Instructions (Signed)
Restart Aricept at 5mg  daily for 1 month then increase to 2 tabs daily Will call in to local pharmacy F/U in 6 months, next visit with Dr. Jannifer Franklin

## 2013-08-25 ENCOUNTER — Other Ambulatory Visit: Payer: Self-pay

## 2013-08-25 MED ORDER — ATORVASTATIN CALCIUM 40 MG PO TABS: 40.0000 mg | ORAL_TABLET | Freq: Every day | ORAL | Status: DC

## 2013-08-25 MED ORDER — ATORVASTATIN CALCIUM 40 MG PO TABS
40.0000 mg | ORAL_TABLET | Freq: Every day | ORAL | Status: DC
Start: 2013-08-25 — End: 2014-07-12

## 2013-09-10 DIAGNOSIS — Z23 Encounter for immunization: Secondary | ICD-10-CM | POA: Diagnosis not present

## 2013-09-14 ENCOUNTER — Other Ambulatory Visit: Payer: Self-pay | Admitting: Family Medicine

## 2013-10-02 ENCOUNTER — Other Ambulatory Visit: Payer: Self-pay | Admitting: Cardiology

## 2013-10-07 ENCOUNTER — Emergency Department (HOSPITAL_COMMUNITY)
Admission: EM | Admit: 2013-10-07 | Discharge: 2013-10-07 | Disposition: A | Discharge status: Home / Self Care | Payer: Medicare Other | Attending: Emergency Medicine | Admitting: Emergency Medicine

## 2013-10-07 ENCOUNTER — Emergency Department (HOSPITAL_COMMUNITY): Payer: Medicare Other

## 2013-10-07 ENCOUNTER — Telehealth: Payer: Self-pay | Admitting: Family Medicine

## 2013-10-07 ENCOUNTER — Encounter (HOSPITAL_COMMUNITY): Payer: Self-pay | Admitting: Emergency Medicine

## 2013-10-07 DIAGNOSIS — Z79899 Other long term (current) drug therapy: Secondary | ICD-10-CM | POA: Diagnosis not present

## 2013-10-07 DIAGNOSIS — E669 Obesity, unspecified: Secondary | ICD-10-CM | POA: Insufficient documentation

## 2013-10-07 DIAGNOSIS — S22009A Unspecified fracture of unspecified thoracic vertebra, initial encounter for closed fracture: Secondary | ICD-10-CM | POA: Diagnosis not present

## 2013-10-07 DIAGNOSIS — Z8659 Personal history of other mental and behavioral disorders: Secondary | ICD-10-CM | POA: Diagnosis not present

## 2013-10-07 DIAGNOSIS — E039 Hypothyroidism, unspecified: Secondary | ICD-10-CM | POA: Diagnosis not present

## 2013-10-07 DIAGNOSIS — Z86010 Personal history of colonic polyps: Secondary | ICD-10-CM | POA: Insufficient documentation

## 2013-10-07 DIAGNOSIS — I5030 Unspecified diastolic (congestive) heart failure: Secondary | ICD-10-CM | POA: Insufficient documentation

## 2013-10-07 DIAGNOSIS — R0602 Shortness of breath: Secondary | ICD-10-CM | POA: Diagnosis not present

## 2013-10-07 DIAGNOSIS — M546 Pain in thoracic spine: Secondary | ICD-10-CM | POA: Diagnosis not present

## 2013-10-07 DIAGNOSIS — G2581 Restless legs syndrome: Secondary | ICD-10-CM | POA: Diagnosis not present

## 2013-10-07 DIAGNOSIS — K589 Irritable bowel syndrome without diarrhea: Secondary | ICD-10-CM | POA: Insufficient documentation

## 2013-10-07 DIAGNOSIS — Z7982 Long term (current) use of aspirin: Secondary | ICD-10-CM | POA: Diagnosis not present

## 2013-10-07 DIAGNOSIS — I10 Essential (primary) hypertension: Secondary | ICD-10-CM | POA: Insufficient documentation

## 2013-10-07 DIAGNOSIS — S299XXA Unspecified injury of thorax, initial encounter: Secondary | ICD-10-CM | POA: Diagnosis not present

## 2013-10-07 DIAGNOSIS — R7989 Other specified abnormal findings of blood chemistry: Secondary | ICD-10-CM | POA: Diagnosis not present

## 2013-10-07 DIAGNOSIS — Z87891 Personal history of nicotine dependence: Secondary | ICD-10-CM | POA: Diagnosis not present

## 2013-10-07 DIAGNOSIS — R945 Abnormal results of liver function studies: Secondary | ICD-10-CM

## 2013-10-07 DIAGNOSIS — S22059A Unspecified fracture of T5-T6 vertebra, initial encounter for closed fracture: Secondary | ICD-10-CM

## 2013-10-07 DIAGNOSIS — R400 Somnolence: Secondary | ICD-10-CM | POA: Diagnosis not present

## 2013-10-07 DIAGNOSIS — R55 Syncope and collapse: Secondary | ICD-10-CM | POA: Insufficient documentation

## 2013-10-07 DIAGNOSIS — M542 Cervicalgia: Secondary | ICD-10-CM | POA: Diagnosis not present

## 2013-10-07 DIAGNOSIS — S199XXA Unspecified injury of neck, initial encounter: Secondary | ICD-10-CM | POA: Diagnosis not present

## 2013-10-07 DIAGNOSIS — M797 Fibromyalgia: Secondary | ICD-10-CM | POA: Insufficient documentation

## 2013-10-07 DIAGNOSIS — Z8611 Personal history of tuberculosis: Secondary | ICD-10-CM | POA: Insufficient documentation

## 2013-10-07 DIAGNOSIS — E785 Hyperlipidemia, unspecified: Secondary | ICD-10-CM | POA: Insufficient documentation

## 2013-10-07 DIAGNOSIS — T148 Other injury of unspecified body region: Secondary | ICD-10-CM | POA: Diagnosis not present

## 2013-10-07 DIAGNOSIS — M549 Dorsalgia, unspecified: Secondary | ICD-10-CM | POA: Diagnosis not present

## 2013-10-07 LAB — CBC WITH DIFFERENTIAL/PLATELET
BASOS ABS: 0 10*3/uL (ref 0.0–0.1)
Basophils Relative: 0 % (ref 0–1)
EOS PCT: 0 % (ref 0–5)
Eosinophils Absolute: 0 10*3/uL (ref 0.0–0.7)
HCT: 38.2 % (ref 36.0–46.0)
Hemoglobin: 13.3 g/dL (ref 12.0–15.0)
Lymphocytes Relative: 16 % (ref 12–46)
Lymphs Abs: 1.3 10*3/uL (ref 0.7–4.0)
MCH: 32.5 pg (ref 26.0–34.0)
MCHC: 34.8 g/dL (ref 30.0–36.0)
MCV: 93.4 fL (ref 78.0–100.0)
Monocytes Absolute: 0.5 10*3/uL (ref 0.1–1.0)
Monocytes Relative: 5 % (ref 3–12)
Neutro Abs: 6.7 10*3/uL (ref 1.7–7.7)
Neutrophils Relative %: 79 % — ABNORMAL HIGH (ref 43–77)
Platelets: 127 10*3/uL — ABNORMAL LOW (ref 150–400)
RBC: 4.09 MIL/uL (ref 3.87–5.11)
RDW: 13.3 % (ref 11.5–15.5)
WBC: 8.5 10*3/uL (ref 4.0–10.5)

## 2013-10-07 LAB — COMPREHENSIVE METABOLIC PANEL
ALT: 62 U/L — ABNORMAL HIGH (ref 0–35)
ANION GAP: 16 — AB (ref 5–15)
AST: 113 U/L — AB (ref 0–37)
Albumin: 3.9 g/dL (ref 3.5–5.2)
Alkaline Phosphatase: 64 U/L (ref 39–117)
BUN: 15 mg/dL (ref 6–23)
CALCIUM: 9.6 mg/dL (ref 8.4–10.5)
CO2: 22 meq/L (ref 19–32)
CREATININE: 0.64 mg/dL (ref 0.50–1.10)
Chloride: 105 mEq/L (ref 96–112)
GFR calc Af Amer: >90 mL/min (ref >90)
GFR, EST NON AFRICAN AMERICAN: 83 mL/min — AB (ref >90)
Glucose, Bld: 123 mg/dL — ABNORMAL HIGH (ref 70–99)
Potassium: 4.2 mEq/L (ref 3.7–5.3)
Sodium: 143 mEq/L (ref 137–147)
Total Bilirubin: 1 mg/dL (ref 0.3–1.2)
Total Protein: 7.4 g/dL (ref 6.0–8.3)

## 2013-10-07 LAB — I-STAT TROPONIN, ED: Troponin i, poc: 0 ng/mL (ref 0.00–0.08)

## 2013-10-07 LAB — PRO B NATRIURETIC PEPTIDE: PRO B NATRI PEPTIDE: 295.9 pg/mL (ref 0–450)

## 2013-10-07 MED ORDER — TRAMADOL HCL 50 MG PO TABS: 50.0000 mg | ORAL_TABLET | Freq: Four times a day (QID) | ORAL | Status: DC | PRN

## 2013-10-07 MED ORDER — ONDANSETRON HCL 4 MG/2ML IJ SOLN
4.0000 mg | Freq: Once | INTRAMUSCULAR | Status: AC
  Administered 2013-10-07: 4 mg via INTRAVENOUS
  Filled 2013-10-07: qty 2

## 2013-10-07 MED ORDER — MORPHINE SULFATE 4 MG/ML IJ SOLN
4.0000 mg | Freq: Once | INTRAMUSCULAR | Status: AC
  Administered 2013-10-07: 4 mg via INTRAVENOUS
  Filled 2013-10-07: qty 1

## 2013-10-07 NOTE — ED Notes (Signed)
Reports syncopal episode at 0630 this morning while having a loose stool; fell forward off toilet and landed in floor. Back pain between shoulder blades since fall. Husband helped her to bed after fall. No additional episodes of syncope since 0630.

## 2013-10-07 NOTE — Telephone Encounter (Signed)
Patient Information:  Caller Name: Barnabas Lister  Phone: [phone]  Patient: Rebekah, Hayes  Gender: Female  DOB: 05/03/1932  Age: 80 Years  PCP: Carolann Littler [facility])  Office Follow Up:  Does the office need to follow up with this patient?: No  Instructions For The Office: N/A  RN Note:  Spouse calling regarding [name] who fell in bathroom and had involuntary BM.  Unable to walk, and has slurred speech.  Advised to call 911 NOW.  Spouse agrees.  Symptoms  Reason For Call & Symptoms: fell in bathroom  Reviewed Health History In EMR: Yes  Reviewed Medications In EMR: Yes  Reviewed Allergies In EMR: Yes  Reviewed Surgeries / Procedures: Yes  Date of Onset of Symptoms: 10/07/2013  Guideline(s) Used:  Neurologic Deficit  Disposition Per Guideline:   Call EMS 911 Now  Reason For Disposition Reached:   New neurologic deficit that is present NOW, sudden onset of ANY of the following:   Weakness of the face, arm, or leg on one side of the body  Numbness of the face, arm, or leg on one side of the body  Loss of speech or garbled speech  Advice Given:  N/A  Patient Will Follow Care Advice:  YES

## 2013-10-07 NOTE — Telephone Encounter (Signed)
Noted  

## 2013-10-07 NOTE — ED Notes (Signed)
Patient transported to X-ray 

## 2013-10-07 NOTE — ED Provider Notes (Signed)
CSN: [id number]     Arrival date & time 10/07/13  1514 History   First MD Initiated Contact with Patient 10/07/13 1524     Chief Complaint  Patient presents with  . Loss of Consciousness     (Consider location/radiation/quality/duration/timing/severity/associated sxs/prior Treatment) The history is provided by the patient.  ALMA MOHIUDDIN is a 80 y.o. female hx of HTN, CAD with no stents, CVA, fibromyalgia here with syncope. She was sitting on the toilet around 6:30 AM and had a bowel movement then passed out. Denies chest pain or shortness of breath. She denies head injury. States that her back was caught between the cabinet and the floor. Complains of back pain afterwards. Has been walking around today and called her PCP, who wasn't in today so was directed to come to the ED. Denies previous hx of syncope. Denies hx of PE/DVT.    Past Medical History  Diagnosis Date  . OSA (obstructive sleep apnea)   . History of bronchitis   . Other diseases of lung, not elsewhere classified   . Hypertension   . CAD (coronary artery disease)     LAD 30% 2006  . History of atrial flutter   . Cerebrovascular disease   . Hyperlipemia   . Obesity   . GERD (gastroesophageal reflux disease)   . Diverticulosis of colon   . IBS (irritable bowel syndrome)   . Colonic polyp   . Crohn's disease   . Hypothyroidism   . History of hyperparathyroidism   . Fibromyalgia   . Osteopenia   . History of headache   . Syncope   . Restless legs syndrome (RLS)   . Psychosis   . History of tuberculosis   . Radiation 05/2008    5000 cGy to left lower eyelid, parotid lymphatics and upper neck  . Memory disturbance   . Bipolar affective     History of psychosis  . Merkel cell tumor     Left eyelid   Past Surgical History  Procedure Laterality Date  . Parathyroid adenoma surgery    . Cholecystectomy    . Moh';s surg left lower eye lid surgery and lid reconstruction surgery  2/10  . Bladder suspension    .  Cataract extraction Bilateral   . Appendectomy    . Tonsillectomy and adenoidectomy    . Lumbar laminectomy    . Incision and drainage perirectal abscess    . Heel spur excision    . Nasal sinus surgery Right     Right maxillary  . Eye lid surgery  04/04/2011    [facility] by Dr. Norlene Duel   Family History  Problem Relation Age of Onset  . Colon cancer      grandmother  . Arthritis Mother   . Hypertension Mother   . Ulcers Mother   . Heart failure Father   . Pulmonary embolism     History  Substance Use Topics  . Smoking status: Former Smoker -- 6 years    Types: Cigarettes    Quit date: 01/08/1960  . Smokeless tobacco: Never Used  . Alcohol Use: No   OB History   Grav Para Term Preterm Abortions TAB SAB Ect Mult Living                 Review of Systems  Cardiovascular: Positive for syncope.  Musculoskeletal: Positive for back pain.  All other systems reviewed and are negative.     Allergies  Bacitracin; Carbamazepine; Codeine; Diclofenac sodium; Fentanyl;  Fluoxetine hcl; Hydromorphone hcl; Indomethacin; Lisinopril; Meperidine hcl; Morphine; Prednisone; Pregabalin; Propranolol hcl; Refresh lacri-lube; Ropinirole hcl; and Sulfamethoxazole  Home Medications   Prior to Admission medications   Medication Sig Start Date End Date Taking? Authorizing Provider  aspirin 81 MG tablet Take 81 mg by mouth every evening.    Yes Historical Provider, MD  atorvastatin (LIPITOR) 40 MG tablet Take 1 tablet (40 mg total) by mouth daily. 08/25/13  Yes [name], MD  Biotin 5000 MCG TABS Take 1 tablet by mouth daily.     Yes Historical Provider, MD  Calcium-Magnesium 500-250 MG TABS Take 2 tablets by mouth daily. 02/18/11  Yes Tammy S Parrett, NP  carvedilol (COREG) 25 MG tablet Take 1 tablet (25 mg total) by mouth 2 (two) times daily with a meal. 10/04/13  Yes Minus Breeding, MD  Cholecalciferol (VITAMIN D3) 2000 UNITS TABS Take 1 tablet by mouth daily. 02/18/11  Yes Tammy S Parrett,  NP  cholestyramine (QUESTRAN) 4 GM/DOSE powder Take 1 g by mouth daily with breakfast. 2 scoops per day   Yes Historical Provider, MD  co-enzyme Q-10 50 MG capsule Take 50 mg by mouth daily.   Yes Historical Provider, MD  donepezil (ARICEPT) 5 MG tablet Take 10 mg by mouth daily.   Yes Historical Provider, MD  Evening Primrose Oil 500 MG CAPS Take 1 capsule by mouth 2 (two) times daily.    Yes Historical Provider, MD  fluticasone (FLONASE) 50 MCG/ACT nasal spray Place 2 sprays into both nostrils at bedtime.   Yes Historical Provider, MD  guaifenesin (HUMIBID E) 400 MG TABS Take 400 mg by mouth 2 (two) times daily. 1 TABLET BY MOUTH TWO TIMES DAILY   Yes Historical Provider, MD  ketotifen (THERA TEARS ALLERGY) 0.025 % ophthalmic solution Place 1 drop into both eyes daily as needed (dry eyes).   Yes Historical Provider, MD  levothyroxine (SYNTHROID, LEVOTHROID) 88 MCG tablet Take 1 tablet (88 mcg total) by mouth daily before breakfast. 04/07/13  Yes [name], MD  lidocaine (LIDODERM) 5 % Place 1 patch onto the skin every 12 (twelve) hours as needed (back pain). Remove & Discard patch within 12 hours or as directed by MD   Yes Historical Provider, MD  meclizine (ANTIVERT) 25 MG tablet Take 1 tablet (25 mg total) by mouth 3 (three) times daily as needed. Take 1 tablet every 6 hours as needed 04/16/12  Yes Kathrynn Ducking, MD  mesalamine (LIALDA) 1.2 G EC tablet Take 3.6 g by mouth daily with breakfast.   Yes Historical Provider, MD  Multiple Vitamins-Minerals (SUPER VITA-MINS PO) Take 1 tablet by mouth daily. Daily at noon   Yes Historical Provider, MD  Omega 3 1000 MG CAPS Take 1,000 mg by mouth 2 (two) times daily.   Yes Historical Provider, MD  pramipexole (MIRAPEX) 0.5 MG tablet Take 0.5 mg by mouth 2 (two) times daily.   Yes Historical Provider, MD  PROBIOTIC CAPS Take 1 capsule by mouth daily. 02/18/11  Yes Tammy S Parrett, NP  Simethicone (PHAZYME PO) Take 1-2 tablets by mouth 2 (two) times  daily. Take 1 tablet at noon and 2 tablets at 4pm   Yes Historical Provider, MD  traMADol (ULTRAM) 50 MG tablet Take 1 tablet (50 mg total) by mouth every 8 (eight) hours as needed for pain. 01/15/12  Yes Noralee Space, MD  vitamin C (ASCORBIC ACID) 500 MG tablet Take 500 mg by mouth daily.   Yes Historical Provider, MD  BP 167/53  Pulse 55  Temp(Src) 97.6 F (36.4 C) (Oral)  Resp 25  Ht 5\' 3"  (1.6 m)  Wt 165 lb (74.844 kg)  BMI 29.24 kg/m2  SpO2 100% Physical Exam  Nursing note and vitals reviewed. Constitutional: She is oriented to person, place, and time.  Chronically ill, uncomfortable   HENT:  Head: Normocephalic and atraumatic.  Mouth/Throat: Oropharynx is clear and moist.  Eyes: Conjunctivae and EOM are normal. Pupils are equal, round, and reactive to light.  Neck: Normal range of motion. Neck supple.  Dec ROM, no obvious midline tenderness   Cardiovascular: Normal rate, regular rhythm and normal heart sounds.   Pulmonary/Chest: Effort normal and breath sounds normal. No respiratory distress. She has no wheezes. She has no rales.  Abdominal: Soft. Bowel sounds are normal. She exhibits no distension. There is no tenderness. There is no rebound and no guarding.  Musculoskeletal:  + upper thoracic tenderness. Nl ROM bilateral hips, no obvious injury to extremities   Neurological: She is alert and oriented to person, place, and time. No cranial nerve deficit. Coordination normal.  Skin: Skin is warm and dry.  Psychiatric: She has a normal mood and affect. Her behavior is normal. Judgment and thought content normal.    ED Course  Procedures (including critical care time) Labs Review Labs Reviewed  CBC WITH DIFFERENTIAL - Abnormal; Notable for the following:    Platelets 127 (*)    Neutrophils Relative % 79 (*)    All other components within normal limits  COMPREHENSIVE METABOLIC PANEL - Abnormal; Notable for the following:    Glucose, Bld 123 (*)    AST 113 (*)    ALT 62  (*)    GFR calc non Af Amer 83 (*)    Anion gap 16 (*)    All other components within normal limits  PRO B NATRIURETIC PEPTIDE  I-STAT TROPOININ, ED    Imaging Review Dg Chest 2 View  10/07/2013   CLINICAL DATA:  Shortness of breath, loss of consciousness, and a fall today. Now with knee and to a upper back and lower neck pain ; history of hypertension, tuberculosis, coronary artery disease, and bronchitis ; remote history of smoking  EXAM: CHEST  2 VIEW  COMPARISON:  PA and lateral chest x-ray of December 16, 2012 ; thoracic spine series of today's date  FINDINGS: The lungs are well-expanded. There is no focal infiltrate. There is no pleural effusion. The cardiac silhouette is top-normal in size. The pulmonary vascularity is not engorged. The mediastinum is normal in width. There is mild loss of height of the body of T5 which is new since the study of December 16, 2012. The loss of height is approximately 25% anteriorly and posteriorly. The observed portions of the ribs are normal.  IMPRESSION: There is no acute cardiopulmonary abnormality. There is partial compression of the body of T5 which has appeared since the December 16, 2012 study.   Electronically Signed   By: Monish Haliburton  [place]   On: 10/07/2013 16:52   Dg Cervical Spine Complete  10/07/2013   CLINICAL DATA:  Status post fall. Following syncopal episode. Complaining of lower neck pain  EXAM: CERVICAL SPINE  4+ VIEWS  COMPARISON:  Thoracic spine series of today's date.  FINDINGS: The cervical vertebral bodies are preserved in height. There is mild disc space narrowing at C5-6 and at C6-7. There is facet joint hypertrophy and uncovertebral joint hypertrophy at multiple levels. The odontoid is intact.  IMPRESSION: There is  no acute cervical spine fracture or dislocation. There is moderate degenerative disc and facet joint change at multiple levels.   Electronically Signed   By: Flora Parks  [place]   On: 10/07/2013 16:56   Dg Thoracic Spine 2  View  10/07/2013   CLINICAL DATA:  Loss of consciousness and subsequent fall today. Now with mid and upper back and lower neck pain  EXAM: THORACIC SPINE - 2 VIEW  COMPARISON:  PA and lateral chest x-ray of today's date and of December 16, 2012  FINDINGS: There is gentle mid thoracic curvature convex toward the right new since December 2014 that is likely positional. There is mild loss of height of the body of T5. This amounts to approximately 25% anteriorly and posteriorly. No retropulsion of bone is demonstrated. There are no abnormal paravertebral soft tissue densities.  IMPRESSION: There is partial compression of the body of T5 new since December 2014. Otherwise no acute lumbar spine abnormality is demonstrated.   Electronically Signed   By: Wade Asebedo  [place]   On: 10/07/2013 16:53     EKG Interpretation   Date/Time:  Thursday October 07 2013 15:32:10 EDT Ventricular Rate:  55 PR Interval:  139 QRS Duration: 79 QT Interval:  459 QTC Calculation: 439 R Axis:   52 Text Interpretation:  Sinus rhythm Abnormal R-wave progression, early  transition No significant change since last tracing Confirmed by Milanni Ayub  MD,  Izic Stfort ([id number]) on 10/07/2013 3:35:35 PM      MDM   Final diagnoses:  None    NAJWA SPILLANE is a 80 y.o. female here with syncope with BM. Consider vasovagal syncope. However, due to age, will need to r/o ischemic causes and new onset chf. Will get labs, CXR, bnp.   5:02 PM Xray showed small T5 fracture. Neuro vascular intact. No signs of cauda equina. Labs unremarkable, except mildly elevated LFts. Will have her f/u with PMD. Will refill tramadol.    Wandra Arthurs, MD 10/07/13 [phone]

## 2013-10-07 NOTE — Discharge Instructions (Signed)
Take tramadol for pain.   Stay hydrated.   Follow up with your doctor.   Liver function slightly elevated. Needs to be rechecked.   You can get holter monitor if you pass out again.   Return to ER if you are passing out, chest pain, dizziness.

## 2013-10-08 ENCOUNTER — Emergency Department (HOSPITAL_COMMUNITY)
Admission: EM | Admit: 2013-10-08 | Discharge: 2013-10-08 | Disposition: A | Discharge status: Home / Self Care | Payer: Medicare Other | Attending: Emergency Medicine | Admitting: Emergency Medicine

## 2013-10-08 ENCOUNTER — Telehealth: Payer: Self-pay | Admitting: Family Medicine

## 2013-10-08 ENCOUNTER — Encounter (HOSPITAL_COMMUNITY): Payer: Self-pay | Admitting: Emergency Medicine

## 2013-10-08 DIAGNOSIS — I10 Essential (primary) hypertension: Secondary | ICD-10-CM | POA: Insufficient documentation

## 2013-10-08 DIAGNOSIS — M8580 Other specified disorders of bone density and structure, unspecified site: Secondary | ICD-10-CM | POA: Insufficient documentation

## 2013-10-08 DIAGNOSIS — E669 Obesity, unspecified: Secondary | ICD-10-CM | POA: Insufficient documentation

## 2013-10-08 DIAGNOSIS — Z7951 Long term (current) use of inhaled steroids: Secondary | ICD-10-CM | POA: Diagnosis not present

## 2013-10-08 DIAGNOSIS — G2581 Restless legs syndrome: Secondary | ICD-10-CM | POA: Insufficient documentation

## 2013-10-08 DIAGNOSIS — Z7982 Long term (current) use of aspirin: Secondary | ICD-10-CM | POA: Diagnosis not present

## 2013-10-08 DIAGNOSIS — I2510 Atherosclerotic heart disease of native coronary artery without angina pectoris: Secondary | ICD-10-CM | POA: Diagnosis not present

## 2013-10-08 DIAGNOSIS — E785 Hyperlipidemia, unspecified: Secondary | ICD-10-CM | POA: Diagnosis not present

## 2013-10-08 DIAGNOSIS — Z8673 Personal history of transient ischemic attack (TIA), and cerebral infarction without residual deficits: Secondary | ICD-10-CM | POA: Insufficient documentation

## 2013-10-08 DIAGNOSIS — K5090 Crohn's disease, unspecified, without complications: Secondary | ICD-10-CM | POA: Insufficient documentation

## 2013-10-08 DIAGNOSIS — Z79899 Other long term (current) drug therapy: Secondary | ICD-10-CM | POA: Diagnosis not present

## 2013-10-08 DIAGNOSIS — S22059D Unspecified fracture of T5-T6 vertebra, subsequent encounter for fracture with routine healing: Secondary | ICD-10-CM | POA: Diagnosis not present

## 2013-10-08 DIAGNOSIS — Z8611 Personal history of tuberculosis: Secondary | ICD-10-CM | POA: Insufficient documentation

## 2013-10-08 DIAGNOSIS — Z87891 Personal history of nicotine dependence: Secondary | ICD-10-CM | POA: Diagnosis not present

## 2013-10-08 DIAGNOSIS — E039 Hypothyroidism, unspecified: Secondary | ICD-10-CM | POA: Diagnosis not present

## 2013-10-08 DIAGNOSIS — S298XXD Other specified injuries of thorax, subsequent encounter: Secondary | ICD-10-CM | POA: Diagnosis present

## 2013-10-08 DIAGNOSIS — K219 Gastro-esophageal reflux disease without esophagitis: Secondary | ICD-10-CM | POA: Diagnosis not present

## 2013-10-08 DIAGNOSIS — I4892 Unspecified atrial flutter: Secondary | ICD-10-CM | POA: Insufficient documentation

## 2013-10-08 DIAGNOSIS — M542 Cervicalgia: Secondary | ICD-10-CM | POA: Diagnosis not present

## 2013-10-08 DIAGNOSIS — E213 Hyperparathyroidism, unspecified: Secondary | ICD-10-CM | POA: Insufficient documentation

## 2013-10-08 DIAGNOSIS — M546 Pain in thoracic spine: Secondary | ICD-10-CM

## 2013-10-08 MED ORDER — HYDROCODONE-ACETAMINOPHEN 5-325 MG PO TABS
1.0000 | ORAL_TABLET | Freq: Once | ORAL | Status: AC
  Administered 2013-10-08: 1 via ORAL
  Filled 2013-10-08: qty 1

## 2013-10-08 MED ORDER — ONDANSETRON 4 MG PO TBDP
4.0000 mg | ORAL_TABLET | Freq: Once | ORAL | Status: AC
  Administered 2013-10-08: 4 mg via ORAL
  Filled 2013-10-08: qty 1

## 2013-10-08 MED ORDER — HYDROCODONE-ACETAMINOPHEN 5-325 MG PO TABS: 1.0000 | ORAL_TABLET | ORAL | Status: DC | PRN

## 2013-10-08 MED ORDER — ONDANSETRON HCL 4 MG PO TABS: 4.0000 mg | ORAL_TABLET | Freq: Three times a day (TID) | ORAL | Status: DC | PRN

## 2013-10-08 NOTE — Telephone Encounter (Signed)
Patient Information:  Caller Name: [place]  Phone: [phone]  Patient: [name], [name]  Gender: Female  DOB: [birth year]  Age: 80 Years  PCP: Carolann Littler [facility])  Office Follow Up:  Does the office need to follow up with this patient?: No  Instructions For The Office: N/A  RN Note:  Contacted office and spoke with Estill Bamberg.  Reviewed EPIC. Explained to Mr.Guevara we would need to call 911 /EMS for transport since she cannot walk/move or travel in car. Husband given information. Care advice provided. He will call EMS for assistance. Encouraged to call back for question, concerns or changes.  Symptoms  Reason For Call & Symptoms: Husband Barnabas Lister on phone.  He states his wife was treated at [facility] last night for Fractures at her neck due to fall  . She has a T5 [employer] copy[employer]. She was given Tramadol . Patient has multiple allergies. Husband states she cannot get pain under control . Location of pain  under cervical area of neck . He states that she is having difficulty getting her up or down. She cannot get in and out of car. She needs to go to ER for pain management.  Reviewed Health History In EMR: Yes  Reviewed Medications In EMR: Yes  Reviewed Allergies In EMR: Yes  Reviewed Surgeries / Procedures: Yes  Date of Onset of Symptoms: 10/07/2013  Guideline(s) Used:  Back Injury  Disposition Per Guideline:   Go to Office Now  Reason For Disposition Reached:   Severe pain (e.g., excruciating, unable to do any normal activities)  Advice Given:  Call Back If:  Severe pain lasts over 2 hours after pain medicine and ice  You become worse.  RN Overrode Recommendation:  Go To ED  Referred to ED for pain management and transport.

## 2013-10-08 NOTE — Telephone Encounter (Signed)
Can you please call patient back to set up ED follow up appointment.

## 2013-10-08 NOTE — ED Notes (Signed)
Pt presents via EMS from home with c/o neck pain from an injury that occurred about one month ago. Pt was seen yesterday for same, unsure of where she was seen. Pt c/o to EMS of pain in her neck with movement, has full ROM. Pt has hx of degenerative disc disease.

## 2013-10-08 NOTE — Telephone Encounter (Signed)
Pt has been resc to 10/14/13

## 2013-10-08 NOTE — ED Provider Notes (Signed)
CSN: [id number]     Arrival date & time 10/08/13  1650 History   First MD Initiated Contact with Patient 10/08/13 1657     Chief Complaint  Patient presents with  . Neck Pain     (Consider location/radiation/quality/duration/timing/severity/associated sxs/prior Treatment) Patient is a 80 y.o. female presenting with back pain.  Back Pain Location:  Thoracic spine Quality:  Aching Radiates to:  Does not radiate Pain severity:  Severe Pain is:  Same all the time Onset quality:  Gradual Duration:  2 weeks Timing:  Constant Progression:  Unchanged Chronicity:  New Context: falling (2 weeks ago at onset of pain)   Associated symptoms: no abdominal pain, no chest pain, no dysuria, no fever and no numbness     Past Medical History  Diagnosis Date  . OSA (obstructive sleep apnea)   . History of bronchitis   . Other diseases of lung, not elsewhere classified   . Hypertension   . CAD (coronary artery disease)     LAD 30% 2006  . History of atrial flutter   . Cerebrovascular disease   . Hyperlipemia   . Obesity   . GERD (gastroesophageal reflux disease)   . Diverticulosis of colon   . IBS (irritable bowel syndrome)   . Colonic polyp   . Crohn's disease   . Hypothyroidism   . History of hyperparathyroidism   . Fibromyalgia   . Osteopenia   . History of headache   . Syncope   . Restless legs syndrome (RLS)   . Psychosis   . History of tuberculosis   . Radiation 05/2008    5000 cGy to left lower eyelid, parotid lymphatics and upper neck  . Memory disturbance   . Bipolar affective     History of psychosis  . Merkel cell tumor     Left eyelid   Past Surgical History  Procedure Laterality Date  . Parathyroid adenoma surgery    . Cholecystectomy    . Moh';s surg left lower eye lid surgery and lid reconstruction surgery  2/10  . Bladder suspension    . Cataract extraction Bilateral   . Appendectomy    . Tonsillectomy and adenoidectomy    . Lumbar laminectomy    .  Incision and drainage perirectal abscess    . Heel spur excision    . Nasal sinus surgery Right     Right maxillary  . Eye lid surgery  04/04/2011    [facility] by Dr. Norlene Duel   Family History  Problem Relation Age of Onset  . Colon cancer      grandmother  . Arthritis Mother   . Hypertension Mother   . Ulcers Mother   . Heart failure Father   . Pulmonary embolism     History  Substance Use Topics  . Smoking status: Former Smoker -- 6 years    Types: Cigarettes    Quit date: 01/08/1960  . Smokeless tobacco: Never Used  . Alcohol Use: No   OB History   Grav Para Term Preterm Abortions TAB SAB Ect Mult Living                 Review of Systems  Constitutional: Negative for fever and chills.  HENT: Negative for congestion, rhinorrhea and sore throat.   Eyes: Negative for photophobia and visual disturbance.  Respiratory: Negative for cough and shortness of breath.   Cardiovascular: Negative for chest pain and leg swelling.  Gastrointestinal: Negative for nausea, vomiting, abdominal pain, diarrhea and  constipation.  Endocrine: Negative for polyphagia and polyuria.  Genitourinary: Negative for dysuria, flank pain, vaginal bleeding, vaginal discharge and enuresis.  Musculoskeletal: Positive for back pain. Negative for gait problem.  Skin: Negative for color change and rash.  Neurological: Negative for dizziness, syncope, light-headedness and numbness.  Hematological: Negative for adenopathy. Does not bruise/bleed easily.  All other systems reviewed and are negative.     Allergies  Bacitracin; Carbamazepine; Codeine; Diclofenac sodium; Fentanyl; Fluoxetine hcl; Hydromorphone hcl; Indomethacin; Lisinopril; Meperidine hcl; Morphine; Prednisone; Pregabalin; Propranolol hcl; Refresh lacri-lube; Ropinirole hcl; and Sulfamethoxazole  Home Medications   Prior to Admission medications   Medication Sig Start Date End Date Taking? Authorizing Provider  aspirin 81 MG tablet Take 81 mg  by mouth every evening.     Historical Provider, MD  atorvastatin (LIPITOR) 40 MG tablet Take 1 tablet (40 mg total) by mouth daily. 08/25/13   [name], MD  Biotin 5000 MCG TABS Take 1 tablet by mouth daily.      Historical Provider, MD  Calcium-Magnesium 500-250 MG TABS Take 2 tablets by mouth daily. 02/18/11   Tammy S Parrett, NP  carvedilol (COREG) 25 MG tablet Take 1 tablet (25 mg total) by mouth 2 (two) times daily with a meal. 10/04/13   Minus Breeding, MD  Cholecalciferol (VITAMIN D3) 2000 UNITS TABS Take 1 tablet by mouth daily. 02/18/11   Tammy S Parrett, NP  cholestyramine (QUESTRAN) 4 GM/DOSE powder Take 1 g by mouth daily with breakfast. 2 scoops per day    Historical Provider, MD  co-enzyme Q-10 50 MG capsule Take 50 mg by mouth daily.    Historical Provider, MD  donepezil (ARICEPT) 5 MG tablet Take 10 mg by mouth daily.    Historical Provider, MD  Evening Primrose Oil 500 MG CAPS Take 1 capsule by mouth 2 (two) times daily.     Historical Provider, MD  fluticasone (FLONASE) 50 MCG/ACT nasal spray Place 2 sprays into both nostrils at bedtime.    Historical Provider, MD  guaifenesin (HUMIBID E) 400 MG TABS Take 400 mg by mouth 2 (two) times daily. 1 TABLET BY MOUTH TWO TIMES DAILY    Historical Provider, MD  HYDROcodone-acetaminophen (NORCO/VICODIN) 5-325 MG per tablet Take 1 tablet by mouth every 4 (four) hours as needed. 10/08/13   Debby Freiberg, MD  ketotifen (THERA TEARS ALLERGY) 0.025 % ophthalmic solution Place 1 drop into both eyes daily as needed (dry eyes).    Historical Provider, MD  levothyroxine (SYNTHROID, LEVOTHROID) 88 MCG tablet Take 1 tablet (88 mcg total) by mouth daily before breakfast. 04/07/13   [name], MD  lidocaine (LIDODERM) 5 % Place 1 patch onto the skin every 12 (twelve) hours as needed (back pain). Remove & Discard patch within 12 hours or as directed by MD    Historical Provider, MD  meclizine (ANTIVERT) 25 MG tablet Take 1 tablet (25 mg total)  by mouth 3 (three) times daily as needed. Take 1 tablet every 6 hours as needed 04/16/12   Kathrynn Ducking, MD  mesalamine (LIALDA) 1.2 G EC tablet Take 3.6 g by mouth daily with breakfast.    Historical Provider, MD  Multiple Vitamins-Minerals (SUPER VITA-MINS PO) Take 1 tablet by mouth daily. Daily at noon    Historical Provider, MD  Omega 3 1000 MG CAPS Take 1,000 mg by mouth 2 (two) times daily.    Historical Provider, MD  ondansetron (ZOFRAN) 4 MG tablet Take 1 tablet (4 mg total) by mouth  every 8 (eight) hours as needed for nausea or vomiting. 10/08/13   Debby Freiberg, MD  pramipexole (MIRAPEX) 0.5 MG tablet Take 0.5 mg by mouth 2 (two) times daily.    Historical Provider, MD  PROBIOTIC CAPS Take 1 capsule by mouth daily. 02/18/11   Tammy S Parrett, NP  Simethicone (PHAZYME PO) Take 1-2 tablets by mouth 2 (two) times daily. Take 1 tablet at noon and 2 tablets at 4pm    Historical Provider, MD  traMADol (ULTRAM) 50 MG tablet Take 1 tablet (50 mg total) by mouth every 8 (eight) hours as needed for pain. 01/15/12   Noralee Space, MD  traMADol (ULTRAM) 50 MG tablet Take 1 tablet (50 mg total) by mouth every 6 (six) hours as needed. 10/07/13   Wandra Arthurs, MD  vitamin C (ASCORBIC ACID) 500 MG tablet Take 500 mg by mouth daily.    Historical Provider, MD   BP 168/51  Pulse 57  Temp(Src) 97.6 F (36.4 C) (Oral)  Resp 19  SpO2 96% Physical Exam  Vitals reviewed. Constitutional: She is oriented to person, place, and time. She appears well-developed and well-nourished.  HENT:  Head: Normocephalic and atraumatic.  Right Ear: External ear normal.  Left Ear: External ear normal.  Eyes: Conjunctivae and EOM are normal. Pupils are equal, round, and reactive to light.  Neck: Normal range of motion. Neck supple.  Cardiovascular: Normal rate, regular rhythm, normal heart sounds and intact distal pulses.   Pulmonary/Chest: Effort normal and breath sounds normal.  Abdominal: Soft. Bowel sounds are normal.  There is no tenderness.  Musculoskeletal: Normal range of motion.       Cervical back: Normal. She exhibits normal range of motion.       Thoracic back: She exhibits tenderness (t5), bony tenderness and pain.  Neurological: She is alert and oriented to person, place, and time.  Skin: Skin is warm and dry.    ED Course  Procedures (including critical care time) Labs Review Labs Reviewed - No data to display  Imaging Review Dg Chest 2 View  10/07/2013   CLINICAL DATA:  Shortness of breath, loss of consciousness, and a fall today. Now with knee and to a upper back and lower neck pain ; history of hypertension, tuberculosis, coronary artery disease, and bronchitis ; remote history of smoking  EXAM: CHEST  2 VIEW  COMPARISON:  PA and lateral chest x-ray of December 16, 2012 ; thoracic spine series of today's date  FINDINGS: The lungs are well-expanded. There is no focal infiltrate. There is no pleural effusion. The cardiac silhouette is top-normal in size. The pulmonary vascularity is not engorged. The mediastinum is normal in width. There is mild loss of height of the body of T5 which is new since the study of December 16, 2012. The loss of height is approximately 25% anteriorly and posteriorly. The observed portions of the ribs are normal.  IMPRESSION: There is no acute cardiopulmonary abnormality. There is partial compression of the body of T5 which has appeared since the December 16, 2012 study.   Electronically Signed   By: David  Martinique   On: 10/07/2013 16:52   Dg Cervical Spine Complete  10/07/2013   CLINICAL DATA:  Status post fall. Following syncopal episode. Complaining of lower neck pain  EXAM: CERVICAL SPINE  4+ VIEWS  COMPARISON:  Thoracic spine series of today's date.  FINDINGS: The cervical vertebral bodies are preserved in height. There is mild disc space narrowing at C5-6 and at  C6-7. There is facet joint hypertrophy and uncovertebral joint hypertrophy at multiple levels. The odontoid  is intact.  IMPRESSION: There is no acute cervical spine fracture or dislocation. There is moderate degenerative disc and facet joint change at multiple levels.   Electronically Signed   By: David  Martinique   On: 10/07/2013 16:56   Dg Thoracic Spine 2 View  10/07/2013   CLINICAL DATA:  Loss of consciousness and subsequent fall today. Now with mid and upper back and lower neck pain  EXAM: THORACIC SPINE - 2 VIEW  COMPARISON:  PA and lateral chest x-ray of today's date and of December 16, 2012  FINDINGS: There is gentle mid thoracic curvature convex toward the right new since December 2014 that is likely positional. There is mild loss of height of the body of T5. This amounts to approximately 25% anteriorly and posteriorly. No retropulsion of bone is demonstrated. There are no abnormal paravertebral soft tissue densities.  IMPRESSION: There is partial compression of the body of T5 new since December 2014. Otherwise no acute lumbar spine abnormality is demonstrated.   Electronically Signed   By: David  Martinique   On: 10/07/2013 16:53     EKG Interpretation None      MDM   Final diagnoses:  T5 vertebral fracture, with routine healing, subsequent encounter  Midline thoracic back pain    80 y.o. female with pertinent PMH of recently dx t5 fx from fall, fibromyalgia, bipolar affective do, CAD presents with continued back pain. Patient seen yesterday for the same with an x-ray which showed the T5 fracture. She refers her today due to continued pain. No chest pain, dypsnea, gi symptoms, urinary symptoms, or other new findings.  She states that she does feel safe at home and has been able to perform her ADLs without difficulty with assistance from her husband. She denies acute worsening of pain or any neurologic symptoms. On arrival today vitals signs and physical exam as above. There is exact reproduction of her pain and symptoms with palpation of the T5 vertebral body. There no focal neurologic deficits. The  patient was able to lift herself up from a lying position. I discussed possible admission for pain control and potential rehabilitation placement, however the patient states that she would rather try a course of home narcotic pain medicine.  We discussed strict return precautions and she will call her PCP on Monday morning.  She was also given referral to a neurosurgeon.  DC home in stable condition.    1. T5 vertebral fracture, with routine healing, subsequent encounter   2. Midline thoracic back pain         Debby Freiberg, MD 10/08/13 1729

## 2013-10-08 NOTE — Discharge Instructions (Signed)
You were going to have significant pain for likely weeks.  You should watch out for new neurologic symptoms including numbness, weakness, difficulty or inability to use the bathroom, or using the bathroom on yourself.  You should call your primary care doctor and inform him that you have a thoracic back fracture.  You do not have signs of ligamentous injury at this time. Vertebral Fracture You have a fracture of one or more vertebra. These are the bony parts that form the spine. Minor vertebral fractures happen when people fall. Osteoporosis is associated with many of these fractures. Hospital care may not be necessary for minor compression fractures that are stable. However, multiple fractures of the spine or unstable injuries can cause severe pain and even damage the spinal cord. A spinal cord injury may cause paralysis, numbness, or loss of normal bowel and bladder control.  Normally there is pain and stiffness in the back for 3 to 6 weeks after a vertebral fracture. Bed rest for several days, pain medicine, and a slow return to activity is often the only treatment that is needed depending on the location of the fracture. Neck and back braces may be helpful in reducing pain and increasing mobility. When your pain allows, you should begin walking or swimming to help maintain your endurance. Exercises to improve motion and to strengthen the back may also be useful after the initial pain improves. Treatment for osteoporosis may be essential for full recovery. This will help reduce your risk of vertebral fractures with a future fall. During the first few days after a spine fracture you may feel nauseated or vomit. If this is severe, hospital care with IV fluids will be needed.  Arrange for follow-up care as recommended to assure proper long-term care and prevention of further spine injury.  SEEK IMMEDIATE MEDICAL CARE IF:  You have increasing pain, vomiting, or are unable to move around at all.  You develop  numbness, tingling, weakness, or paralysis of any part of your body.  You develop a loss of normal bowel or bladder control.  You have difficulty breathing, cough, fever, chest or abdominal pain. MAKE SURE YOU:   Understand these instructions.  Will watch your condition.  Will get help right away if you are not doing well or get worse. Document Released: 02/01/2004 Document Revised: 03/18/2011 Document Reviewed: 08/16/2008 [facility] Patient Information 2015 [place], [place]. This information is not intended to replace advice given to you by your health care provider. Make sure you discuss any questions you have with your health care provider.

## 2013-10-08 NOTE — Progress Notes (Signed)
[facility] Progress Note Patient Details:  [name] 1932-11-21 [id number] Called STAT order in to [facility]. Patient ID: [name], female   DOB: Jun 02, 1932, 80 y.o.   MRN: [id number]   [name] 10/08/2013, 6:30 PM

## 2013-10-08 NOTE — Telephone Encounter (Signed)
Pt is stating that the medication is not helping her pain. Pt is allergic to a lot of drugs

## 2013-10-08 NOTE — ED Notes (Signed)
Bed: [facility] Expected date:  Expected time:  Means of arrival:  Comments: EMS-neck pain

## 2013-10-08 NOTE — Telephone Encounter (Signed)
We really need to see her to discuss all options.

## 2013-10-08 NOTE — Telephone Encounter (Signed)
Noted  

## 2013-10-08 NOTE — Telephone Encounter (Signed)
Pt was seen in er yesterday for a fall and was given rx for tramadol . Pt hus is calling to requesting something else. Pt has appt sch for 10/20/13

## 2013-10-11 ENCOUNTER — Telehealth (HOSPITAL_COMMUNITY): Payer: Self-pay

## 2013-10-12 ENCOUNTER — Emergency Department (HOSPITAL_COMMUNITY): Payer: Medicare Other

## 2013-10-12 ENCOUNTER — Inpatient Hospital Stay (HOSPITAL_COMMUNITY)
Admission: EM | Admit: 2013-10-12 | Discharge: 2013-10-20 | DRG: 460 | Disposition: A | Discharge status: Skilled Nursing Facility | Payer: Medicare Other | Attending: Neurosurgery | Admitting: Neurosurgery

## 2013-10-12 ENCOUNTER — Encounter (HOSPITAL_COMMUNITY): Payer: Medicare Other | Admitting: Anesthesiology

## 2013-10-12 ENCOUNTER — Inpatient Hospital Stay (HOSPITAL_COMMUNITY): Payer: Medicare Other | Admitting: Anesthesiology

## 2013-10-12 ENCOUNTER — Inpatient Hospital Stay (HOSPITAL_COMMUNITY): Payer: Medicare Other

## 2013-10-12 ENCOUNTER — Encounter (HOSPITAL_COMMUNITY)
Admission: EM | Disposition: A | Discharge status: Skilled Nursing Facility | Payer: Self-pay | Source: Home / Self Care | Attending: Neurosurgery

## 2013-10-12 ENCOUNTER — Encounter (HOSPITAL_COMMUNITY): Payer: Self-pay | Admitting: Emergency Medicine

## 2013-10-12 DIAGNOSIS — Z6824 Body mass index (BMI) 24.0-24.9, adult: Secondary | ICD-10-CM | POA: Diagnosis not present

## 2013-10-12 DIAGNOSIS — S12600D Unspecified displaced fracture of seventh cervical vertebra, subsequent encounter for fracture with routine healing: Secondary | ICD-10-CM | POA: Diagnosis not present

## 2013-10-12 DIAGNOSIS — S12600A Unspecified displaced fracture of seventh cervical vertebra, initial encounter for closed fracture: Secondary | ICD-10-CM | POA: Diagnosis present

## 2013-10-12 DIAGNOSIS — S12601A Unspecified nondisplaced fracture of seventh cervical vertebra, initial encounter for closed fracture: Secondary | ICD-10-CM | POA: Diagnosis not present

## 2013-10-12 DIAGNOSIS — F319 Bipolar disorder, unspecified: Secondary | ICD-10-CM | POA: Diagnosis present

## 2013-10-12 DIAGNOSIS — S12500A Unspecified displaced fracture of sixth cervical vertebra, initial encounter for closed fracture: Secondary | ICD-10-CM | POA: Diagnosis not present

## 2013-10-12 DIAGNOSIS — W19XXXA Unspecified fall, initial encounter: Secondary | ICD-10-CM | POA: Diagnosis present

## 2013-10-12 DIAGNOSIS — G2581 Restless legs syndrome: Secondary | ICD-10-CM | POA: Diagnosis present

## 2013-10-12 DIAGNOSIS — Z79899 Other long term (current) drug therapy: Secondary | ICD-10-CM | POA: Diagnosis not present

## 2013-10-12 DIAGNOSIS — M129 Arthropathy, unspecified: Secondary | ICD-10-CM | POA: Diagnosis present

## 2013-10-12 DIAGNOSIS — Z9181 History of falling: Secondary | ICD-10-CM | POA: Diagnosis not present

## 2013-10-12 DIAGNOSIS — R2681 Unsteadiness on feet: Secondary | ICD-10-CM | POA: Diagnosis not present

## 2013-10-12 DIAGNOSIS — E039 Hypothyroidism, unspecified: Secondary | ICD-10-CM | POA: Diagnosis present

## 2013-10-12 DIAGNOSIS — Z885 Allergy status to narcotic agent status: Secondary | ICD-10-CM

## 2013-10-12 DIAGNOSIS — Z888 Allergy status to other drugs, medicaments and biological substances status: Secondary | ICD-10-CM | POA: Diagnosis not present

## 2013-10-12 DIAGNOSIS — R51 Headache: Secondary | ICD-10-CM | POA: Diagnosis not present

## 2013-10-12 DIAGNOSIS — S22018D Other fracture of first thoracic vertebra, subsequent encounter for fracture with routine healing: Secondary | ICD-10-CM | POA: Diagnosis not present

## 2013-10-12 DIAGNOSIS — I10 Essential (primary) hypertension: Secondary | ICD-10-CM | POA: Diagnosis present

## 2013-10-12 DIAGNOSIS — S128XXA Fracture of other parts of neck, initial encounter: Secondary | ICD-10-CM | POA: Diagnosis not present

## 2013-10-12 DIAGNOSIS — K219 Gastro-esophageal reflux disease without esophagitis: Secondary | ICD-10-CM | POA: Diagnosis present

## 2013-10-12 DIAGNOSIS — G4733 Obstructive sleep apnea (adult) (pediatric): Secondary | ICD-10-CM | POA: Diagnosis present

## 2013-10-12 DIAGNOSIS — E669 Obesity, unspecified: Secondary | ICD-10-CM | POA: Diagnosis present

## 2013-10-12 DIAGNOSIS — S22019A Unspecified fracture of first thoracic vertebra, initial encounter for closed fracture: Secondary | ICD-10-CM | POA: Diagnosis not present

## 2013-10-12 DIAGNOSIS — I2510 Atherosclerotic heart disease of native coronary artery without angina pectoris: Secondary | ICD-10-CM | POA: Diagnosis present

## 2013-10-12 DIAGNOSIS — S0511XA Contusion of eyeball and orbital tissues, right eye, initial encounter: Secondary | ICD-10-CM | POA: Diagnosis not present

## 2013-10-12 DIAGNOSIS — Z7982 Long term (current) use of aspirin: Secondary | ICD-10-CM | POA: Diagnosis not present

## 2013-10-12 DIAGNOSIS — M797 Fibromyalgia: Secondary | ICD-10-CM | POA: Diagnosis present

## 2013-10-12 DIAGNOSIS — Z87891 Personal history of nicotine dependence: Secondary | ICD-10-CM | POA: Diagnosis not present

## 2013-10-12 DIAGNOSIS — E785 Hyperlipidemia, unspecified: Secondary | ICD-10-CM | POA: Diagnosis present

## 2013-10-12 DIAGNOSIS — M6281 Muscle weakness (generalized): Secondary | ICD-10-CM | POA: Diagnosis not present

## 2013-10-12 DIAGNOSIS — S12000A Unspecified displaced fracture of first cervical vertebra, initial encounter for closed fracture: Secondary | ICD-10-CM | POA: Diagnosis not present

## 2013-10-12 DIAGNOSIS — R079 Chest pain, unspecified: Secondary | ICD-10-CM | POA: Diagnosis not present

## 2013-10-12 DIAGNOSIS — S1980XA Other specified injuries of unspecified part of neck, initial encounter: Secondary | ICD-10-CM | POA: Diagnosis not present

## 2013-10-12 DIAGNOSIS — Z886 Allergy status to analgesic agent status: Secondary | ICD-10-CM | POA: Diagnosis not present

## 2013-10-12 DIAGNOSIS — M549 Dorsalgia, unspecified: Secondary | ICD-10-CM | POA: Diagnosis not present

## 2013-10-12 DIAGNOSIS — E213 Hyperparathyroidism, unspecified: Secondary | ICD-10-CM | POA: Diagnosis present

## 2013-10-12 DIAGNOSIS — C4A9 Merkel cell carcinoma, unspecified: Secondary | ICD-10-CM | POA: Diagnosis not present

## 2013-10-12 DIAGNOSIS — M47816 Spondylosis without myelopathy or radiculopathy, lumbar region: Secondary | ICD-10-CM | POA: Diagnosis not present

## 2013-10-12 DIAGNOSIS — S22018A Other fracture of first thoracic vertebra, initial encounter for closed fracture: Secondary | ICD-10-CM | POA: Diagnosis present

## 2013-10-12 DIAGNOSIS — M542 Cervicalgia: Secondary | ICD-10-CM | POA: Diagnosis not present

## 2013-10-12 DIAGNOSIS — Z9889 Other specified postprocedural states: Secondary | ICD-10-CM | POA: Diagnosis not present

## 2013-10-12 DIAGNOSIS — I679 Cerebrovascular disease, unspecified: Secondary | ICD-10-CM | POA: Diagnosis present

## 2013-10-12 DIAGNOSIS — M47814 Spondylosis without myelopathy or radiculopathy, thoracic region: Secondary | ICD-10-CM | POA: Diagnosis not present

## 2013-10-12 DIAGNOSIS — M4120 Other idiopathic scoliosis, site unspecified: Secondary | ICD-10-CM | POA: Diagnosis not present

## 2013-10-12 HISTORY — PX: POSTERIOR CERVICAL FUSION/FORAMINOTOMY: SHX5038

## 2013-10-12 LAB — TROPONIN I

## 2013-10-12 LAB — BASIC METABOLIC PANEL
ANION GAP: 13 (ref 5–15)
BUN: 13 mg/dL (ref 6–23)
CHLORIDE: 98 meq/L (ref 96–112)
CO2: 26 mEq/L (ref 19–32)
CREATININE: 0.63 mg/dL (ref 0.50–1.10)
Calcium: 9.4 mg/dL (ref 8.4–10.5)
GFR calc Af Amer: >90 mL/min (ref >90)
GFR calc non Af Amer: 83 mL/min — ABNORMAL LOW (ref >90)
Glucose, Bld: 110 mg/dL — ABNORMAL HIGH (ref 70–99)
Potassium: 4 mEq/L (ref 3.7–5.3)
Sodium: 137 mEq/L (ref 137–147)

## 2013-10-12 LAB — URINALYSIS, ROUTINE W REFLEX MICROSCOPIC
Bilirubin Urine: NEGATIVE
Glucose, UA: NEGATIVE mg/dL
HGB URINE DIPSTICK: NEGATIVE
Ketones, ur: 15 mg/dL — AB
Leukocytes, UA: NEGATIVE
NITRITE: NEGATIVE
Protein, ur: NEGATIVE mg/dL
SPECIFIC GRAVITY, URINE: 1.031 — AB (ref 1.005–1.030)
Urobilinogen, UA: 0.2 mg/dL (ref 0.0–1.0)
pH: 6 (ref 5.0–8.0)

## 2013-10-12 LAB — CBC WITH DIFFERENTIAL/PLATELET
BASOS ABS: 0 10*3/uL (ref 0.0–0.1)
BASOS PCT: 0 % (ref 0–1)
EOS PCT: 2 % (ref 0–5)
Eosinophils Absolute: 0.2 10*3/uL (ref 0.0–0.7)
HEMATOCRIT: 38.6 % (ref 36.0–46.0)
Hemoglobin: 13 g/dL (ref 12.0–15.0)
Lymphocytes Relative: 23 % (ref 12–46)
Lymphs Abs: 1.7 10*3/uL (ref 0.7–4.0)
MCH: 31.5 pg (ref 26.0–34.0)
MCHC: 33.7 g/dL (ref 30.0–36.0)
MCV: 93.5 fL (ref 78.0–100.0)
MONO ABS: 0.7 10*3/uL (ref 0.1–1.0)
Monocytes Relative: 9 % (ref 3–12)
Neutro Abs: 5 10*3/uL (ref 1.7–7.7)
Neutrophils Relative %: 66 % (ref 43–77)
Platelets: 177 10*3/uL (ref 150–400)
RBC: 4.13 MIL/uL (ref 3.87–5.11)
RDW: 13.5 % (ref 11.5–15.5)
WBC: 7.5 10*3/uL (ref 4.0–10.5)

## 2013-10-12 SURGERY — POSTERIOR CERVICAL FUSION/FORAMINOTOMY LEVEL 4
Anesthesia: General | Site: Neck

## 2013-10-12 MED ORDER — BISACODYL 10 MG RE SUPP
10.0000 mg | Freq: Every day | RECTAL | Status: DC | PRN
Start: 2013-10-12 — End: 2013-10-12

## 2013-10-12 MED ORDER — MECLIZINE HCL 12.5 MG PO TABS
25.0000 mg | ORAL_TABLET | Freq: Three times a day (TID) | ORAL | Status: DC | PRN
  Administered 2013-10-15: 25 mg via ORAL
  Filled 2013-10-12: qty 2
  Filled 2013-10-12: qty 1

## 2013-10-12 MED ORDER — SIMETHICONE 80 MG PO CHEW
80.0000 mg | CHEWABLE_TABLET | Freq: Four times a day (QID) | ORAL | Status: DC | PRN
  Filled 2013-10-12: qty 1

## 2013-10-12 MED ORDER — HEMOSTATIC AGENTS (NO CHARGE) OPTIME
TOPICAL | Status: DC | PRN
  Administered 2013-10-12: 1 via TOPICAL

## 2013-10-12 MED ORDER — POLYETHYLENE GLYCOL 3350 17 G PO PACK
17.0000 g | PACK | Freq: Every day | ORAL | Status: DC | PRN
  Filled 2013-10-12: qty 1

## 2013-10-12 MED ORDER — BISACODYL 10 MG RE SUPP: 10.0000 mg | Freq: Every day | RECTAL | Status: DC | PRN

## 2013-10-12 MED ORDER — VITAMIN D3 50 MCG (2000 UT) PO TABS: 2000.0000 [IU] | ORAL_TABLET | Freq: Every day | ORAL | Status: DC

## 2013-10-12 MED ORDER — BUPIVACAINE HCL (PF) 0.5 % IJ SOLN
INTRAMUSCULAR | Status: DC | PRN
  Administered 2013-10-12: 20 mL

## 2013-10-12 MED ORDER — DIAZEPAM 5 MG PO TABS
5.0000 mg | ORAL_TABLET | Freq: Four times a day (QID) | ORAL | Status: DC | PRN
  Administered 2013-10-16 – 2013-10-17 (×3): 5 mg via ORAL
  Filled 2013-10-12 (×3): qty 1

## 2013-10-12 MED ORDER — MAGNESIUM OXIDE 400 (241.3 MG) MG PO TABS
400.0000 mg | ORAL_TABLET | Freq: Every day | ORAL | Status: DC
  Administered 2013-10-13 – 2013-10-20 (×8): 400 mg via ORAL
  Filled 2013-10-12 (×8): qty 1

## 2013-10-12 MED ORDER — LIDOCAINE HCL (CARDIAC) 20 MG/ML IV SOLN
INTRAVENOUS | Status: AC
  Filled 2013-10-12: qty 5

## 2013-10-12 MED ORDER — ONDANSETRON HCL 4 MG/2ML IJ SOLN
INTRAMUSCULAR | Status: DC | PRN
  Administered 2013-10-12: 4 mg via INTRAVENOUS

## 2013-10-12 MED ORDER — VANCOMYCIN HCL IN DEXTROSE 1-5 GM/200ML-% IV SOLN
INTRAVENOUS | Status: AC
  Administered 2013-10-12: 1000 mg via INTRAVENOUS
  Filled 2013-10-12: qty 200

## 2013-10-12 MED ORDER — ALUM & MAG HYDROXIDE-SIMETH 200-200-20 MG/5ML PO SUSP: 30.0000 mL | Freq: Four times a day (QID) | ORAL | Status: DC | PRN

## 2013-10-12 MED ORDER — GLYCOPYRROLATE 0.2 MG/ML IJ SOLN
INTRAMUSCULAR | Status: DC | PRN
  Administered 2013-10-12: 0.6 mg via INTRAVENOUS

## 2013-10-12 MED ORDER — CEFAZOLIN SODIUM-DEXTROSE 2-3 GM-% IV SOLR
INTRAVENOUS | Status: AC
  Administered 2013-10-12: 2 g via INTRAVENOUS
  Filled 2013-10-12: qty 50

## 2013-10-12 MED ORDER — HYDROCODONE-ACETAMINOPHEN 5-325 MG PO TABS
1.0000 | ORAL_TABLET | ORAL | Status: DC | PRN
  Administered 2013-10-13 – 2013-10-16 (×7): 1 via ORAL
  Filled 2013-10-12 (×7): qty 1

## 2013-10-12 MED ORDER — SODIUM CHLORIDE 0.9 % IV SOLN: 250.0000 mL | INTRAVENOUS | Status: DC

## 2013-10-12 MED ORDER — TRAMADOL HCL 50 MG PO TABS
50.0000 mg | ORAL_TABLET | Freq: Once | ORAL | Status: AC
  Administered 2013-10-12: 50 mg via ORAL
  Filled 2013-10-12: qty 1

## 2013-10-12 MED ORDER — NEOSTIGMINE METHYLSULFATE 10 MG/10ML IV SOLN
INTRAVENOUS | Status: DC | PRN
  Administered 2013-10-12: 4 mg via INTRAVENOUS

## 2013-10-12 MED ORDER — ATORVASTATIN CALCIUM 40 MG PO TABS
40.0000 mg | ORAL_TABLET | Freq: Every day | ORAL | Status: DC
  Administered 2013-10-12 – 2013-10-19 (×8): 40 mg via ORAL
  Filled 2013-10-12 (×8): qty 1

## 2013-10-12 MED ORDER — BIOTIN 5000 MCG PO TABS: 5000.0000 ug | ORAL_TABLET | ORAL | Status: DC

## 2013-10-12 MED ORDER — FLUORESCEIN SODIUM 1 MG OP STRP
1.0000 | ORAL_STRIP | Freq: Once | OPHTHALMIC | Status: AC
  Administered 2013-10-12: 1 via OPHTHALMIC
  Filled 2013-10-12: qty 1

## 2013-10-12 MED ORDER — SENNA 8.6 MG PO TABS
1.0000 | ORAL_TABLET | Freq: Two times a day (BID) | ORAL | Status: DC
  Administered 2013-10-12 – 2013-10-20 (×16): 8.6 mg via ORAL
  Filled 2013-10-12 (×16): qty 1

## 2013-10-12 MED ORDER — HYDROCODONE-ACETAMINOPHEN 5-325 MG PO TABS: 1.0000 | ORAL_TABLET | ORAL | Status: DC | PRN

## 2013-10-12 MED ORDER — PHENOL 1.4 % MT LIQD: 1.0000 | OROMUCOSAL | Status: DC | PRN

## 2013-10-12 MED ORDER — LIDOCAINE-EPINEPHRINE 1 %-1:100000 IJ SOLN
INTRAMUSCULAR | Status: DC | PRN
  Administered 2013-10-12: 20 mL

## 2013-10-12 MED ORDER — ACETAMINOPHEN 325 MG PO TABS: 650.0000 mg | ORAL_TABLET | ORAL | Status: DC | PRN

## 2013-10-12 MED ORDER — GUAIFENESIN 400 MG PO TABS: 400.0000 mg | ORAL_TABLET | Freq: Two times a day (BID) | ORAL | Status: DC

## 2013-10-12 MED ORDER — SODIUM CHLORIDE 0.9 % IJ SOLN: 3.0000 mL | INTRAMUSCULAR | Status: DC | PRN

## 2013-10-12 MED ORDER — MIDAZOLAM HCL 5 MG/5ML IJ SOLN
INTRAMUSCULAR | Status: DC | PRN
  Administered 2013-10-12: 1 mg via INTRAVENOUS

## 2013-10-12 MED ORDER — CALCIUM-MAGNESIUM 500-250 MG PO TABS: 2.0000 | ORAL_TABLET | Freq: Every day | ORAL | Status: DC

## 2013-10-12 MED ORDER — ROCURONIUM BROMIDE 100 MG/10ML IV SOLN
INTRAVENOUS | Status: DC | PRN
Start: 2013-10-12 — End: 2013-10-12
  Administered 2013-10-12: 10 mg via INTRAVENOUS
  Administered 2013-10-12: 30 mg via INTRAVENOUS

## 2013-10-12 MED ORDER — DOCUSATE SODIUM 100 MG PO CAPS
100.0000 mg | ORAL_CAPSULE | Freq: Two times a day (BID) | ORAL | Status: DC
  Administered 2013-10-12 – 2013-10-20 (×16): 100 mg via ORAL
  Filled 2013-10-12 (×17): qty 1

## 2013-10-12 MED ORDER — VITAMIN D 1000 UNITS PO TABS
2000.0000 [IU] | ORAL_TABLET | Freq: Every day | ORAL | Status: DC
  Administered 2013-10-13 – 2013-10-20 (×8): 2000 [IU] via ORAL
  Filled 2013-10-12 (×8): qty 2

## 2013-10-12 MED ORDER — PHENYLEPHRINE HCL 10 MG/ML IJ SOLN
10.0000 mg | INTRAVENOUS | Status: DC | PRN
  Administered 2013-10-12: 40 ug/min via INTRAVENOUS

## 2013-10-12 MED ORDER — ALBUMIN HUMAN 5 % IV SOLN
INTRAVENOUS | Status: DC | PRN
  Administered 2013-10-12: 19:00:00 via INTRAVENOUS

## 2013-10-12 MED ORDER — NEOSTIGMINE METHYLSULFATE 10 MG/10ML IV SOLN
INTRAVENOUS | Status: AC
  Filled 2013-10-12: qty 1

## 2013-10-12 MED ORDER — GLYCOPYRROLATE 0.2 MG/ML IJ SOLN
INTRAMUSCULAR | Status: AC
  Filled 2013-10-12: qty 3

## 2013-10-12 MED ORDER — ONDANSETRON HCL 4 MG/2ML IJ SOLN
4.0000 mg | Freq: Once | INTRAMUSCULAR | Status: AC
  Administered 2013-10-12: 4 mg via INTRAVENOUS
  Filled 2013-10-12: qty 2

## 2013-10-12 MED ORDER — THROMBIN 5000 UNITS EX SOLR
CUTANEOUS | Status: DC | PRN
  Administered 2013-10-12 (×2): 5000 [IU] via TOPICAL

## 2013-10-12 MED ORDER — ONDANSETRON HCL 4 MG/2ML IJ SOLN: 4.0000 mg | INTRAMUSCULAR | Status: DC | PRN

## 2013-10-12 MED ORDER — DIAZEPAM 5 MG PO TABS: 5.0000 mg | ORAL_TABLET | Freq: Four times a day (QID) | ORAL | Status: DC | PRN

## 2013-10-12 MED ORDER — LIDOCAINE 5 % EX PTCH
1.0000 | MEDICATED_PATCH | Freq: Two times a day (BID) | CUTANEOUS | Status: DC | PRN
  Administered 2013-10-13: 1 via TRANSDERMAL
  Filled 2013-10-12 (×2): qty 1

## 2013-10-12 MED ORDER — PANTOPRAZOLE SODIUM 40 MG IV SOLR: 40.0000 mg | Freq: Every day | INTRAVENOUS | Status: DC

## 2013-10-12 MED ORDER — MENTHOL 3 MG MT LOZG: 1.0000 | LOZENGE | OROMUCOSAL | Status: DC | PRN

## 2013-10-12 MED ORDER — MESALAMINE 1.2 G PO TBEC
3.6000 g | DELAYED_RELEASE_TABLET | Freq: Every day | ORAL | Status: DC
  Administered 2013-10-13 – 2013-10-19 (×7): 3.6 g via ORAL
  Filled 2013-10-12 (×8): qty 3

## 2013-10-12 MED ORDER — SUFENTANIL CITRATE 50 MCG/ML IV SOLN
INTRAVENOUS | Status: AC
  Filled 2013-10-12: qty 1

## 2013-10-12 MED ORDER — FLEET ENEMA 7-19 GM/118ML RE ENEM: 1.0000 | ENEMA | Freq: Once | RECTAL | Status: DC | PRN

## 2013-10-12 MED ORDER — SUFENTANIL CITRATE 50 MCG/ML IV SOLN
INTRAVENOUS | Status: DC | PRN
  Administered 2013-10-12: 5 ug via INTRAVENOUS
  Administered 2013-10-12: 20 ug via INTRAVENOUS

## 2013-10-12 MED ORDER — SODIUM CHLORIDE 0.9 % IV SOLN: 250.0000 mL | INTRAVENOUS | Status: DC | PRN

## 2013-10-12 MED ORDER — PANTOPRAZOLE SODIUM 40 MG IV SOLR
40.0000 mg | Freq: Every day | INTRAVENOUS | Status: DC
  Administered 2013-10-12 – 2013-10-15 (×4): 40 mg via INTRAVENOUS
  Filled 2013-10-12 (×4): qty 40

## 2013-10-12 MED ORDER — KCL IN DEXTROSE-NACL 20-5-0.45 MEQ/L-%-% IV SOLN: INTRAVENOUS | Status: DC

## 2013-10-12 MED ORDER — LIDOCAINE HCL (CARDIAC) 20 MG/ML IV SOLN
INTRAVENOUS | Status: DC | PRN
  Administered 2013-10-12: 60 mg via INTRAVENOUS

## 2013-10-12 MED ORDER — ZOLPIDEM TARTRATE 5 MG PO TABS: 5.0000 mg | ORAL_TABLET | Freq: Every evening | ORAL | Status: DC | PRN

## 2013-10-12 MED ORDER — HYDROMORPHONE HCL 1 MG/ML IJ SOLN: 0.2500 mg | INTRAMUSCULAR | Status: DC | PRN

## 2013-10-12 MED ORDER — ROCURONIUM BROMIDE 50 MG/5ML IV SOLN
INTRAVENOUS | Status: AC
  Filled 2013-10-12: qty 1

## 2013-10-12 MED ORDER — GUAIFENESIN 200 MG PO TABS
400.0000 mg | ORAL_TABLET | Freq: Two times a day (BID) | ORAL | Status: DC
  Administered 2013-10-12 – 2013-10-20 (×15): 400 mg via ORAL
  Filled 2013-10-12 (×18): qty 2

## 2013-10-12 MED ORDER — PHENYLEPHRINE HCL 10 MG/ML IJ SOLN
INTRAMUSCULAR | Status: AC
  Filled 2013-10-12: qty 2

## 2013-10-12 MED ORDER — POLYETHYLENE GLYCOL 3350 17 G PO PACK
17.0000 g | PACK | Freq: Every day | ORAL | Status: DC | PRN
Start: 2013-10-12 — End: 2013-10-12

## 2013-10-12 MED ORDER — LACTATED RINGERS IV SOLN
INTRAVENOUS | Status: DC | PRN
  Administered 2013-10-12: 18:00:00 via INTRAVENOUS

## 2013-10-12 MED ORDER — SENNA 8.6 MG PO TABS: 1.0000 | ORAL_TABLET | Freq: Two times a day (BID) | ORAL | Status: DC

## 2013-10-12 MED ORDER — SACCHAROMYCES BOULARDII 250 MG PO CAPS
250.0000 mg | ORAL_CAPSULE | Freq: Every day | ORAL | Status: DC
  Administered 2013-10-13 – 2013-10-20 (×8): 250 mg via ORAL
  Filled 2013-10-12 (×8): qty 1

## 2013-10-12 MED ORDER — KCL IN DEXTROSE-NACL 20-5-0.45 MEQ/L-%-% IV SOLN
INTRAVENOUS | Status: DC
  Administered 2013-10-12 – 2013-10-13 (×2): via INTRAVENOUS
  Filled 2013-10-12 (×5): qty 1000

## 2013-10-12 MED ORDER — DONEPEZIL HCL 10 MG PO TABS
10.0000 mg | ORAL_TABLET | Freq: Every day | ORAL | Status: DC
  Administered 2013-10-13 – 2013-10-20 (×8): 10 mg via ORAL
  Filled 2013-10-12 (×9): qty 1

## 2013-10-12 MED ORDER — PRAMIPEXOLE DIHYDROCHLORIDE 0.125 MG PO TABS
0.5000 mg | ORAL_TABLET | Freq: Two times a day (BID) | ORAL | Status: DC
  Administered 2013-10-12 – 2013-10-20 (×16): 0.5 mg via ORAL
  Filled 2013-10-12: qty 4
  Filled 2013-10-12: qty 2
  Filled 2013-10-12: qty 4
  Filled 2013-10-12: qty 2
  Filled 2013-10-12 (×6): qty 4
  Filled 2013-10-12: qty 2
  Filled 2013-10-12 (×5): qty 4

## 2013-10-12 MED ORDER — ACETAMINOPHEN 650 MG RE SUPP: 650.0000 mg | RECTAL | Status: DC | PRN

## 2013-10-12 MED ORDER — 0.9 % SODIUM CHLORIDE (POUR BTL) OPTIME
TOPICAL | Status: DC | PRN
  Administered 2013-10-12: 1000 mL

## 2013-10-12 MED ORDER — SUCCINYLCHOLINE CHLORIDE 20 MG/ML IJ SOLN
INTRAMUSCULAR | Status: DC | PRN
  Administered 2013-10-12: 100 mg via INTRAVENOUS

## 2013-10-12 MED ORDER — PROPOFOL 10 MG/ML IV BOLUS
INTRAVENOUS | Status: AC
  Filled 2013-10-12: qty 20

## 2013-10-12 MED ORDER — FLEET ENEMA 7-19 GM/118ML RE ENEM
1.0000 | ENEMA | Freq: Once | RECTAL | Status: AC | PRN
Start: 2013-10-12 — End: 2013-10-12
  Filled 2013-10-12: qty 1

## 2013-10-12 MED ORDER — SODIUM CHLORIDE 0.9 % IJ SOLN
3.0000 mL | Freq: Two times a day (BID) | INTRAMUSCULAR | Status: DC
  Administered 2013-10-12 – 2013-10-20 (×8): 3 mL via INTRAVENOUS

## 2013-10-12 MED ORDER — FENTANYL CITRATE 0.05 MG/ML IJ SOLN
INTRAMUSCULAR | Status: AC
  Filled 2013-10-12: qty 5

## 2013-10-12 MED ORDER — MIDAZOLAM HCL 2 MG/2ML IJ SOLN
INTRAMUSCULAR | Status: AC
  Filled 2013-10-12: qty 2

## 2013-10-12 MED ORDER — ONDANSETRON HCL 4 MG PO TABS: 4.0000 mg | ORAL_TABLET | Freq: Three times a day (TID) | ORAL | Status: DC | PRN

## 2013-10-12 MED ORDER — ASPIRIN EC 81 MG PO TBEC
81.0000 mg | DELAYED_RELEASE_TABLET | Freq: Every evening | ORAL | Status: DC
  Administered 2013-10-12 – 2013-10-19 (×8): 81 mg via ORAL
  Filled 2013-10-12 (×8): qty 1

## 2013-10-12 MED ORDER — VANCOMYCIN HCL IN DEXTROSE 1-5 GM/200ML-% IV SOLN
1000.0000 mg | Freq: Two times a day (BID) | INTRAVENOUS | Status: DC
  Administered 2013-10-13 – 2013-10-14 (×3): 1000 mg via INTRAVENOUS
  Filled 2013-10-12 (×6): qty 200

## 2013-10-12 MED ORDER — CARVEDILOL 12.5 MG PO TABS
25.0000 mg | ORAL_TABLET | Freq: Two times a day (BID) | ORAL | Status: DC
  Administered 2013-10-13 – 2013-10-20 (×12): 25 mg via ORAL
  Filled 2013-10-12 (×5): qty 2
  Filled 2013-10-12 (×2): qty 1
  Filled 2013-10-12 (×2): qty 2
  Filled 2013-10-12: qty 4
  Filled 2013-10-12 (×5): qty 2
  Filled 2013-10-12: qty 1

## 2013-10-12 MED ORDER — CALCIUM CARBONATE 1250 (500 CA) MG PO TABS
1.0000 | ORAL_TABLET | Freq: Every day | ORAL | Status: DC
  Administered 2013-10-13 – 2013-10-20 (×8): 500 mg via ORAL
  Filled 2013-10-12 (×9): qty 1

## 2013-10-12 MED ORDER — CEFAZOLIN SODIUM 1-5 GM-% IV SOLN
1.0000 g | Freq: Three times a day (TID) | INTRAVENOUS | Status: AC
  Administered 2013-10-13 (×2): 1 g via INTRAVENOUS
  Filled 2013-10-12 (×2): qty 50

## 2013-10-12 MED ORDER — PROBIOTIC PO CAPS: 1.0000 | ORAL_CAPSULE | Freq: Every day | ORAL | Status: DC

## 2013-10-12 MED ORDER — KETOTIFEN FUMARATE 0.025 % OP SOLN
1.0000 [drp] | Freq: Every day | OPHTHALMIC | Status: DC | PRN
Start: 2013-10-12 — End: 2013-10-20
  Filled 2013-10-12: qty 5

## 2013-10-12 MED ORDER — DOCUSATE SODIUM 100 MG PO CAPS: 100.0000 mg | ORAL_CAPSULE | Freq: Two times a day (BID) | ORAL | Status: DC

## 2013-10-12 MED ORDER — SODIUM CHLORIDE 0.9 % IJ SOLN
3.0000 mL | Freq: Two times a day (BID) | INTRAMUSCULAR | Status: DC
  Administered 2013-10-12 – 2013-10-20 (×7): 3 mL via INTRAVENOUS

## 2013-10-12 MED ORDER — ZOLPIDEM TARTRATE 5 MG PO TABS
5.0000 mg | ORAL_TABLET | Freq: Every evening | ORAL | Status: DC | PRN
  Administered 2013-10-16: 5 mg via ORAL
  Filled 2013-10-12: qty 1

## 2013-10-12 MED ORDER — CO-ENZYME Q-10 50 MG PO CAPS: 50.0000 mg | ORAL_CAPSULE | Freq: Every day | ORAL | Status: DC

## 2013-10-12 MED ORDER — LEVOTHYROXINE SODIUM 88 MCG PO TABS
88.0000 ug | ORAL_TABLET | Freq: Every day | ORAL | Status: DC
  Administered 2013-10-13 – 2013-10-20 (×8): 88 ug via ORAL
  Filled 2013-10-12 (×9): qty 1

## 2013-10-12 MED ORDER — VITAMIN C 500 MG PO TABS
500.0000 mg | ORAL_TABLET | Freq: Every day | ORAL | Status: DC
  Administered 2013-10-13 – 2013-10-19 (×7): 500 mg via ORAL
  Filled 2013-10-12 (×7): qty 1

## 2013-10-12 MED ORDER — CHOLESTYRAMINE 4 G PO PACK
4.0000 g | PACK | Freq: Every day | ORAL | Status: DC
  Administered 2013-10-13 – 2013-10-19 (×7): 4 g via ORAL
  Filled 2013-10-12 (×8): qty 1

## 2013-10-12 MED ORDER — CHOLESTYRAMINE 4 GM/DOSE PO POWD: 4.0000 g | Freq: Every day | ORAL | Status: DC

## 2013-10-12 MED ORDER — FLUTICASONE PROPIONATE 50 MCG/ACT NA SUSP
2.0000 | Freq: Every day | NASAL | Status: DC
Start: 2013-10-12 — End: 2013-10-20
  Administered 2013-10-12 – 2013-10-19 (×8): 2 via NASAL
  Filled 2013-10-12 (×2): qty 16

## 2013-10-12 MED ORDER — THROMBIN 5000 UNITS EX SOLR: CUTANEOUS | Status: DC | PRN

## 2013-10-12 SURGICAL SUPPLY — 86 items
BENZOIN TINCTURE PRP APPL 2/3 (GAUZE/BANDAGES/DRESSINGS) IMPLANT
BIT DRILL NEURO 2X3.1 SFT TUCH (MISCELLANEOUS) IMPLANT
BLADE CLIPPER SURG (BLADE) IMPLANT
BLADE SURG 11 STRL SS (BLADE) ×3 IMPLANT
BLADE ULTRA TIP 2M (BLADE) IMPLANT
BUR PRECISION FLUTE 5.0 (BURR) IMPLANT
CANISTER SUCT 3000ML (MISCELLANEOUS) ×3 IMPLANT
CLOSURE WOUND 1/2 X4 (GAUZE/BANDAGES/DRESSINGS)
CONT SPEC 4OZ CLIKSEAL STRL BL (MISCELLANEOUS) ×3 IMPLANT
DECANTER SPIKE VIAL GLASS SM (MISCELLANEOUS) ×3 IMPLANT
DERMABOND ADVANCED (GAUZE/BANDAGES/DRESSINGS) ×2
DERMABOND ADVANCED .7 DNX12 (GAUZE/BANDAGES/DRESSINGS) ×1 IMPLANT
DRAPE C-ARM 42X72 X-RAY (DRAPES) ×6 IMPLANT
DRAPE C-ARMOR (DRAPES) ×3 IMPLANT
DRAPE LAPAROTOMY 100X72 PEDS (DRAPES) ×3 IMPLANT
DRAPE MICROSCOPE LEICA (MISCELLANEOUS) IMPLANT
DRAPE POUCH INSTRU U-SHP 10X18 (DRAPES) ×3 IMPLANT
DRILL NEURO 2X3.1 SOFT TOUCH (MISCELLANEOUS)
DRSG OPSITE POSTOP 4X8 (GAUZE/BANDAGES/DRESSINGS) ×3 IMPLANT
DRSG PAD ABDOMINAL 8X10 ST (GAUZE/BANDAGES/DRESSINGS) IMPLANT
DRSG TELFA 3X8 NADH (GAUZE/BANDAGES/DRESSINGS) ×3 IMPLANT
DURAPREP 6ML APPLICATOR 50/CS (WOUND CARE) ×3 IMPLANT
ELECT BLADE 4.0 EZ CLEAN MEGAD (MISCELLANEOUS) ×3
ELECT REM PT RETURN 9FT ADLT (ELECTROSURGICAL) ×3
ELECTRODE BLDE 4.0 EZ CLN MEGD (MISCELLANEOUS) ×1 IMPLANT
ELECTRODE REM PT RTRN 9FT ADLT (ELECTROSURGICAL) ×1 IMPLANT
GAUZE SPONGE 4X4 12PLY STRL (GAUZE/BANDAGES/DRESSINGS) ×3 IMPLANT
GAUZE SPONGE 4X4 16PLY XRAY LF (GAUZE/BANDAGES/DRESSINGS) IMPLANT
GLOVE BIO SURGEON STRL SZ7 (GLOVE) ×3 IMPLANT
GLOVE BIO SURGEON STRL SZ8 (GLOVE) ×3 IMPLANT
GLOVE BIOGEL PI IND STRL 7.5 (GLOVE) ×1 IMPLANT
GLOVE BIOGEL PI IND STRL 8 (GLOVE) ×1 IMPLANT
GLOVE BIOGEL PI IND STRL 8.5 (GLOVE) ×1 IMPLANT
GLOVE BIOGEL PI INDICATOR 7.5 (GLOVE) ×2
GLOVE BIOGEL PI INDICATOR 8 (GLOVE) ×2
GLOVE BIOGEL PI INDICATOR 8.5 (GLOVE) ×2
GLOVE ECLIPSE 7.5 STRL STRAW (GLOVE) ×12 IMPLANT
GLOVE ECLIPSE 8.0 STRL XLNG CF (GLOVE) ×3 IMPLANT
GLOVE EXAM NITRILE LRG STRL (GLOVE) IMPLANT
GLOVE EXAM NITRILE MD LF STRL (GLOVE) IMPLANT
GLOVE EXAM NITRILE XL STR (GLOVE) IMPLANT
GLOVE EXAM NITRILE XS STR PU (GLOVE) IMPLANT
GLOVE INDICATOR 6.5 STRL GRN (GLOVE) ×3 IMPLANT
GLOVE INDICATOR 7.5 STRL GRN (GLOVE) ×3 IMPLANT
GOWN STRL REUS W/ TWL LRG LVL3 (GOWN DISPOSABLE) IMPLANT
GOWN STRL REUS W/ TWL XL LVL3 (GOWN DISPOSABLE) IMPLANT
GOWN STRL REUS W/TWL 2XL LVL3 (GOWN DISPOSABLE) IMPLANT
GOWN STRL REUS W/TWL LRG LVL3 (GOWN DISPOSABLE)
GOWN STRL REUS W/TWL MED LVL3 (GOWN DISPOSABLE) ×6 IMPLANT
GOWN STRL REUS W/TWL XL LVL3 (GOWN DISPOSABLE)
HEMOSTAT SURGICEL 2X14 (HEMOSTASIS) IMPLANT
KIT BASIN OR (CUSTOM PROCEDURE TRAY) ×3 IMPLANT
KIT INFUSE X SMALL 1.4CC (Orthopedic Implant) ×3 IMPLANT
KIT ROOM TURNOVER OR (KITS) ×3 IMPLANT
MARKER SKIN DUAL TIP RULER LAB (MISCELLANEOUS) ×3 IMPLANT
MILL MEDIUM DISP (BLADE) ×3 IMPLANT
NEEDLE HYPO 18GX1.5 BLUNT FILL (NEEDLE) IMPLANT
NEEDLE HYPO 25X1 1.5 SAFETY (NEEDLE) ×3 IMPLANT
NEEDLE SPNL 22GX3.5 QUINCKE BK (NEEDLE) ×3 IMPLANT
NS IRRIG 1000ML POUR BTL (IV SOLUTION) ×3 IMPLANT
PACK FOAM VITOSS 5CC (Orthopedic Implant) ×3 IMPLANT
PACK LAMINECTOMY NEURO (CUSTOM PROCEDURE TRAY) ×3 IMPLANT
PIN MAYFIELD SKULL DISP (PIN) ×3 IMPLANT
ROD VUEPOINT 3.5X60 (Rod) ×6 IMPLANT
RUBBERBAND STERILE (MISCELLANEOUS) IMPLANT
SCREW MA MM 3.5X12 (Screw) ×12 IMPLANT
SCREW SET THREADED (Screw) ×24 IMPLANT
SCREW VUEPOINT II 4.5X20MM (Screw) ×12 IMPLANT
SPONGE INTESTINAL PEANUT (DISPOSABLE) IMPLANT
SPONGE SURGIFOAM ABS GEL SZ50 (HEMOSTASIS) ×3 IMPLANT
STAPLER SKIN PROX WIDE 3.9 (STAPLE) ×3 IMPLANT
STRIP CLOSURE SKIN 1/2X4 (GAUZE/BANDAGES/DRESSINGS) IMPLANT
SUT ETHILON 3 0 FSL (SUTURE) ×3 IMPLANT
SUT VIC AB 0 CT1 18XCR BRD8 (SUTURE) ×1 IMPLANT
SUT VIC AB 0 CT1 8-18 (SUTURE) ×2
SUT VIC AB 2-0 CP2 18 (SUTURE) ×3 IMPLANT
SUT VIC AB 3-0 SH 8-18 (SUTURE) IMPLANT
SYR 20ML ECCENTRIC (SYRINGE) ×3 IMPLANT
SYR 3ML LL SCALE MARK (SYRINGE) IMPLANT
TAP SHAFT VUEPOINT II 4.0X3.5M (Tap) ×2 IMPLANT
TOWEL OR 17X24 6PK STRL BLUE (TOWEL DISPOSABLE) ×3 IMPLANT
TOWEL OR 17X26 10 PK STRL BLUE (TOWEL DISPOSABLE) ×3 IMPLANT
TRAY FOLEY CATH 14FRSI W/METER (CATHETERS) IMPLANT
UNDERPAD 30X30 INCONTINENT (UNDERPADS AND DIAPERS) ×3 IMPLANT
VUEPOINT 4.0X3.5 MM TAP ×3 IMPLANT
WATER STERILE IRR 1000ML POUR (IV SOLUTION) ×3 IMPLANT

## 2013-10-12 NOTE — Progress Notes (Signed)
ANTIBIOTIC CONSULT NOTE - INITIAL  Pharmacy Consult for Vancomycin Indication: Post op laminectomy with a drain  Allergies  Allergen Reactions  . Prednisone Other (See Comments)    Patient had psychiatric episode with hallucinations. Caused 3.5 week long hospitalization & loss of memory   . Carbamazepine Nausea Only and Other (See Comments)    Tegretol; dizziness, blurred vision, nausea, headache, insomnia  . Celebrex [Celecoxib] Other (See Comments)    No relief from pain; ineffective  . Codeine Nausea And Vomiting  . Demerol [Meperidine] Nausea And Vomiting  . Diclofenac Sodium Other (See Comments)    severe headache  . Fentanyl Nausea And Vomiting and Other (See Comments)    IV Fentanyl; caused nausea and vomiting Fentanyl patch; caused chest pain, HBP, with second patch experienced vertigo and nausea  . Fluoxetine Hcl Nausea Only and Other (See Comments)    dizziness, blurred vision, nausea, headache, insomnia  . Hydromorphone Hcl Nausea And Vomiting  . Ibuprofen Other (See Comments)    Caused ulcers and colitis  . Indomethacin Nausea Only and Other (See Comments)    dizziness, blurred vision, nausea, headache, insomnia  . Lisinopril Cough  . Meperidine Hcl Nausea And Vomiting  . Morphine Nausea And Vomiting  . Oruvail [Ketoprofen] Other (See Comments)    No relief from pain; ineffective  . Pregabalin Other (See Comments)    severe restless legs, insomnia  . Propranolol Hcl Nausea Only and Other (See Comments)    dizziness, blurred vision, nausea, headache, insomnia  . Refresh Lacri-Lube [Artificial Tears] Swelling    Caused swelling, redness, hard crust on eyelids   . Relafen [Nabumetone] Other (See Comments)    No relief from pain; ineffective  . Ropinirole Hcl Other (See Comments)    severe restless legs and insomnia  . Sulfamethoxazole Nausea Only and Other (See Comments)    gas, loose stools  . Bacitracin Hives, Itching and Rash    redness    Patient  Measurements: Height: 5\' 9"  (175.3 cm) Weight: 165 lb (74.844 kg) IBW/kg (Calculated) : 66.2 Adjusted Body Weight:   Vital Signs: Temp: 97.3 F (36.3 C) (10/06 2100) Temp Source: Oral (10/06 2100) BP: 140/51 mmHg (10/06 2048) Pulse Rate: 101 (10/06 2048) Intake/Output from previous day:   Intake/Output from this shift: Total I/O In: 1250 [I.V.:1000; IV Piggyback:250] Out: 520 [Urine:345; Blood:175]  Labs:  Recent Labs  10/12/13 1024  WBC 7.5  HGB 13.0  PLT 177  CREATININE 0.63   Estimated Creatinine Clearance: 59.6 ml/min (by C-G formula based on Cr of 0.63). No results found for this basename: VANCOTROUGH, VANCOPEAK, VANCORANDOM, GENTTROUGH, GENTPEAK, GENTRANDOM, TOBRATROUGH, TOBRAPEAK, TOBRARND, AMIKACINPEAK, AMIKACINTROU, AMIKACIN,  in the last 72 hours   Microbiology: No results found for this or any previous visit (from the past 720 hour(s)).  Medical History: Past Medical History  Diagnosis Date  . OSA (obstructive sleep apnea)   . History of bronchitis   . Other diseases of lung, not elsewhere classified   . Hypertension   . CAD (coronary artery disease)     LAD 30% 2006  . History of atrial flutter   . Cerebrovascular disease   . Hyperlipemia   . Obesity   . GERD (gastroesophageal reflux disease)   . Diverticulosis of colon   . IBS (irritable bowel syndrome)   . Colonic polyp   . Crohn's disease   . Hypothyroidism   . History of hyperparathyroidism   . Fibromyalgia   . Osteopenia   . History of  headache   . Syncope   . Restless legs syndrome (RLS)   . Psychosis   . History of tuberculosis   . Radiation 05/2008    5000 cGy to left lower eyelid, parotid lymphatics and upper neck  . Memory disturbance   . Bipolar affective     History of psychosis  . Merkel cell tumor     Left eyelid    Medications:  Scheduled:  . aspirin EC  81 mg Oral QPM  . atorvastatin  40 mg Oral q1800  . [START ON 10/13/2013] calcium carbonate  1 tablet Oral Q  breakfast  . [START ON 10/13/2013] carvedilol  25 mg Oral BID WC  . [START ON 10/13/2013]  ceFAZolin (ANCEF) IV  1 g Intravenous Q8H  . [START ON 10/13/2013] cholecalciferol  2,000 Units Oral Daily  . [START ON 10/13/2013] cholestyramine  4 g Oral Q1200  . docusate sodium  100 mg Oral BID  . [START ON 10/13/2013] donepezil  10 mg Oral QAC breakfast  . fluticasone  2 spray Each Nare QHS  . guaiFENesin  400 mg Oral BID  . [START ON 10/13/2013] levothyroxine  88 mcg Oral QAC breakfast  . [START ON 10/13/2013] magnesium oxide  400 mg Oral Daily  . [START ON 10/13/2013] mesalamine  3.6 g Oral Q lunch  . pantoprazole (PROTONIX) IV  40 mg Intravenous QHS  . pramipexole  0.5 mg Oral BID  . [START ON 10/13/2013] saccharomyces boulardii  250 mg Oral Daily  . senna  1 tablet Oral BID  . sodium chloride  3 mL Intravenous Q12H  . sodium chloride  3 mL Intravenous Q12H  . [START ON 10/13/2013] vancomycin  1,000 mg Intravenous Q12H  . [START ON 10/13/2013] vitamin C  500 mg Oral Q1200   Assessment: 62 yr of female underwent spinal fusion after a fall a couple of weeks ago. Pharmacy was asked to dose vancomycin while the patient had a drain. Pt has MANY allergies. Goal of Therapy:  Vanc trough 10-15  Plan:  Vancomycin 1 Gm IV q12 hrs.  Minta Balsam 10/12/2013,10:56 PM

## 2013-10-12 NOTE — ED Notes (Signed)
Pt reports pain to neck, upper back and under shoulder blades.

## 2013-10-12 NOTE — ED Notes (Signed)
Per EMS- pt fell 2-3 ago, had a T fracture in her back left AMA. Today called for lower back pain and fell while waiting pt fell and hit her eye on her walker.

## 2013-10-12 NOTE — Anesthesia Preprocedure Evaluation (Addendum)
Anesthesia Evaluation  Patient identified by MRN, date of birth, ID band Patient awake  General Assessment Comment:Multiple drug allergies reported  Reviewed: Allergy & Precautions, H&P , NPO status , Patient's Chart, lab work & pertinent test results  History of Anesthesia Complications (+) PONV  Airway Mallampati: III  Neck ROM: Limited    Dental  (+) Teeth Intact   Pulmonary former smoker,  breath sounds clear to auscultation        Cardiovascular hypertension, Rhythm:Regular Rate:Normal     Neuro/Psych  Headaches,    GI/Hepatic GERD-  ,  Endo/Other  Hypothyroidism   Renal/GU      Musculoskeletal  (+) Arthritis -, Fibromyalgia -  Abdominal (+) + obese,   Peds  Hematology   Anesthesia Other Findings   Reproductive/Obstetrics                          Anesthesia Physical Anesthesia Plan  ASA: III  Anesthesia Plan: General   Post-op Pain Management:    Induction: Intravenous  Airway Management Planned: Oral ETT and Video Laryngoscope Planned  Additional Equipment:   Intra-op Plan:   Post-operative Plan: Extubation in OR  Informed Consent: I have reviewed the patients History and Physical, chart, labs and discussed the procedure including the risks, benefits and alternatives for the proposed anesthesia with the patient or authorized representative who has indicated his/her understanding and acceptance.     Plan Discussed with: CRNA and Surgeon  Anesthesia Plan Comments:        Anesthesia Quick Evaluation

## 2013-10-12 NOTE — Anesthesia Postprocedure Evaluation (Signed)
  Anesthesia Post-op Note  Patient: [name]  Procedure(s) Performed: Procedure(s): Cervical five-Thoracic two cervico-thoracic posterior fusion (N/A)  Patient Location: PACU  Anesthesia Type:General  Level of Consciousness: awake, alert , oriented and patient cooperative  Airway and Oxygen Therapy: Patient Spontanous Breathing  Post-op Pain: none  Post-op Assessment: Post-op Vital signs reviewed, Patient's Cardiovascular Status Stable, Respiratory Function Stable, Patent Airway, No signs of Nausea or vomiting and Pain level controlled  Post-op Vital Signs: Reviewed and stable  Last Vitals:  Filed Vitals:   10/12/13 2034  BP: 125/59  Pulse: 105  Temp: 36.6 C  Resp: 25    Complications: No apparent anesthesia complications

## 2013-10-12 NOTE — ED Notes (Signed)
Patient tearful. Patient stating she has pain. Patient's allergies do not permit and pain medication. Paging MD Vertell Limber.

## 2013-10-12 NOTE — ED Notes (Signed)
Attempted report 

## 2013-10-12 NOTE — Anesthesia Procedure Notes (Signed)
Procedure Name: Intubation Date/Time: 10/12/2013 5:59 PM Performed by: Melina Copa, Illene Sweeting R Pre-anesthesia Checklist: Patient identified, Emergency Drugs available, Suction available, Patient being monitored and Timeout performed Patient Re-evaluated:Patient Re-evaluated prior to inductionOxygen Delivery Method: Circle system utilized Preoxygenation: Pre-oxygenation with 100% oxygen Intubation Type: IV induction Grade View: Grade II Tube type: Oral Tube size: 7.5 mm Number of attempts: 1 Airway Equipment and Method: Rigid stylet and Video-laryngoscopy (Patient remains in rigid collar throughout intubation with no neck movement) Placement Confirmation: ETT inserted through vocal cords under direct vision,  positive ETCO2 and breath sounds checked- equal and bilateral Secured at: 21 cm Tube secured with: Tape Dental Injury: Teeth and Oropharynx as per pre-operative assessment

## 2013-10-12 NOTE — ED Notes (Signed)
Pt transported to cT  °

## 2013-10-12 NOTE — Brief Op Note (Signed)
10/12/2013  8:21 PM  PATIENT:  [name]  80 y.o. female  PRE-OPERATIVE DIAGNOSIS:  C7-T1 fracture dislocation with perched facets  POST-OPERATIVE DIAGNOSIS:  C7-T1 fracture dislocation with perched facets  PROCEDURE:  Procedure(s): Cervical five-Thoracic two cervico-thoracic posterior fusion (N/A) with pedicle screws T 1 and T 2 and lateral mass screws C 5 and C 6 bilaterally with decompressive laminectomy of C 7 and posterolateral arthrodesis with local autograft and allograft  SURGEON:  Surgeon(s) and Role:    * [name], MD - Primary    * Ashok Pall, MD - Assisting  PHYSICIAN ASSISTANT:   ASSISTANTS: none   ANESTHESIA:   general  EBL:  Total I/O In: 1000 [I.V.:750; IV Piggyback:250] Out: 644 [Urine:300; Blood:175]  BLOOD ADMINISTERED:none  DRAINS: (Medium) Hemovact drain(s) in the epidural space with  Suction Open   LOCAL MEDICATIONS USED:  MARCAINE     SPECIMEN:  No Specimen  DISPOSITION OF SPECIMEN:  N/A  COUNTS:  YES  TOURNIQUET:  * No tourniquets in log *  DICTATION: Patient is 80 year old woman with fracture dislocation of C 7 on T 1 with perched C 7 on T 1 facet with subluxation and neck pain.  It was elected to take the patient to surgery for posterior cervical decompression and fusion.   PROCEDURE: Patient was brought to the OR and following smooth and uncomplicated induction of GETA using Glide Scope, patient was placed in 3 pin fixation and rolled into a prone position on the OR table in the cervical collar.  Shoulders were taped to facilitate Xray visualization. Posterior neck was shaved with clippers, prepped and draped in the usual fashion with betadine scrub followed by Duraprep.  Area of planned incision was infiltrated with local lidocaine.  Incision was made from C5-T2 and carried through the avascular midline plane to expose these levels and their lateral masses.  There was significant arthritis at each of these levels with facet arthropathy and  bony excrescences.  Lateral mass screws were placed from C5 to C6 bilaterally (3.5 x 12 mm) according to standard landmarks and their positioning was confirmed with fluoroscopy.  Using AP fluoroscopy pedicle screws were placed bilaterally in the T 1 and T 2 pedicles (4.35 x 20 mm).  60 mm rods were lordosed and affixed to the screw heads.The facet joints and laminae were decorticated.  Screws and rods were locked down in situ.  The fractured spinous process and laminae of C 7 was then removed and the spinal cord dura was decompressed.The spinal cord dura appeared to be pulsatile and well decompressed.  Hemostasis was assured and a medium Hemovac drain was placed in the epidural space. Wound was closed with 0, 2-0, and 3-0 vicryl sutures and dermabond. Sterile occlusive dressing was placed.  Patient was taken out of pins and turned back onto the OR table.  Patient was extubated in the operating room and taken to recovery in stable and satisfactory condition.  Counts were correct at the end of the case.  PLAN OF CARE: Admit to inpatient   PATIENT DISPOSITION:  PACU - hemodynamically stable.   Delay start of Pharmacological VTE agent (>24hrs) due to surgical blood loss or risk of bleeding: yes

## 2013-10-12 NOTE — ED Notes (Signed)
Spoke with MD Vertell Limber about right leg pain. MD stated he did not have a good option with all of her allergies. States to try tramadol or benadryl or zofran with pain medicine.

## 2013-10-12 NOTE — Progress Notes (Signed)
eLink Physician-Brief Progress Note Patient Name: [name] DOB: 04-Jul-1932 MRN: [id number]   Date of Service  10/12/2013  HPI/Events of Note  Neck fracture, s/p surgical repair, extubated and doing well, on RA  eICU Interventions  Chart reviewed, nothing further to add.        YACOUB,WESAM 10/12/2013, 10:04 PM

## 2013-10-12 NOTE — Op Note (Signed)
10/12/2013  8:21 PM  PATIENT:  [name]  80 y.o. female  PRE-OPERATIVE DIAGNOSIS:  C7-T1 fracture dislocation with perched facets  POST-OPERATIVE DIAGNOSIS:  C7-T1 fracture dislocation with perched facets  PROCEDURE:  Procedure(s): Cervical five-Thoracic two cervico-thoracic posterior fusion (N/A) with pedicle screws T 1 and T 2 and lateral mass screws C 5 and C 6 bilaterally with decompressive laminectomy of C 7 and posterolateral arthrodesis with local autograft and allograft  SURGEON:  Surgeon(s) and Role:    * [name], MD - Primary    * Ashok Pall, MD - Assisting  PHYSICIAN ASSISTANT:   ASSISTANTS: none   ANESTHESIA:   general  EBL:  Total I/O In: 1000 [I.V.:750; IV Piggyback:250] Out: 619 [Urine:300; Blood:175]  BLOOD ADMINISTERED:none  DRAINS: (Medium) Hemovact drain(s) in the epidural space with  Suction Open   LOCAL MEDICATIONS USED:  MARCAINE     SPECIMEN:  No Specimen  DISPOSITION OF SPECIMEN:  N/A  COUNTS:  YES  TOURNIQUET:  * No tourniquets in log *  DICTATION: Patient is 80 year old woman with fracture dislocation of C 7 on T 1 with perched C 7 on T 1 facet with subluxation and neck pain.  It was elected to take the patient to surgery for posterior cervical decompression and fusion.   PROCEDURE: Patient was brought to the OR and following smooth and uncomplicated induction of GETA using Glide Scope, patient was placed in 3 pin fixation and rolled into a prone position on the OR table in the cervical collar.  Shoulders were taped to facilitate Xray visualization. Posterior neck was shaved with clippers, prepped and draped in the usual fashion with betadine scrub followed by Duraprep.  Area of planned incision was infiltrated with local lidocaine.  Incision was made from C5-T2 and carried through the avascular midline plane to expose these levels and their lateral masses.  There was significant arthritis at each of these levels with facet arthropathy and  bony excrescences.  Lateral mass screws were placed from C5 to C6 bilaterally (3.5 x 12 mm) according to standard landmarks and their positioning was confirmed with fluoroscopy.  Using AP fluoroscopy pedicle screws were placed bilaterally in the T 1 and T 2 pedicles (4.35 x 20 mm).  60 mm rods were lordosed and affixed to the screw heads.The facet joints and laminae were decorticated.  Screws and rods were locked down in situ.  The fractured spinous process and laminae of C 7 was then removed and the spinal cord dura was decompressed.The spinal cord dura appeared to be pulsatile and well decompressed.  Hemostasis was assured and a medium Hemovac drain was placed in the epidural space. Wound was closed with 0, 2-0, and 3-0 vicryl sutures and dermabond. Sterile occlusive dressing was placed.  Patient was taken out of pins and turned back onto the OR table.  Patient was extubated in the operating room and taken to recovery in stable and satisfactory condition.  Counts were correct at the end of the case.  PLAN OF CARE: Admit to inpatient   PATIENT DISPOSITION:  PACU - hemodynamically stable.   Delay start of Pharmacological VTE agent (>24hrs) due to surgical blood loss or risk of bleeding: yes

## 2013-10-12 NOTE — H&P (Signed)
Reason for Consult:cervico-thoracic fracture dislocation Referring Physician: Dr. Dorris Fetch [name] is an 80 y.o. female.  HPI: Patient presents to the ER for evaluation of back pain and fall. Patient reports that she was diagnosed with a compression fracture in her back after a fall 2 weeks ago. She reports that she has had progressively worsening pain. He became quite severe this morning, she called EMS because of back pain. Apparently, while waiting for EMS she had another fall. She hit her face on her walker. No loss of consciousness. Complains of pain around the right eye.   Past Medical History  Diagnosis Date  . OSA (obstructive sleep apnea)   . History of bronchitis   . Other diseases of lung, not elsewhere classified   . Hypertension   . CAD (coronary artery disease)     LAD 30% 2006  . History of atrial flutter   . Cerebrovascular disease   . Hyperlipemia   . Obesity   . GERD (gastroesophageal reflux disease)   . Diverticulosis of colon   . IBS (irritable bowel syndrome)   . Colonic polyp   . Crohn's disease   . Hypothyroidism   . History of hyperparathyroidism   . Fibromyalgia   . Osteopenia   . History of headache   . Syncope   . Restless legs syndrome (RLS)   . Psychosis   . History of tuberculosis   . Radiation 05/2008    5000 cGy to left lower eyelid, parotid lymphatics and upper neck  . Memory disturbance   . Bipolar affective     History of psychosis  . Merkel cell tumor     Left eyelid    Past Surgical History  Procedure Laterality Date  . Parathyroid adenoma surgery    . Cholecystectomy    . Moh';s surg left lower eye lid surgery and lid reconstruction surgery  2/10  . Bladder suspension    . Cataract extraction Bilateral   . Appendectomy    . Tonsillectomy and adenoidectomy    . Lumbar laminectomy    . Incision and drainage perirectal abscess    . Heel spur excision    . Nasal sinus surgery Right     Right maxillary  . Eye lid surgery   04/04/2011    [facility] by Dr. Norlene Duel    Family History  Problem Relation Age of Onset  . Colon cancer      grandmother  . Arthritis Mother   . Hypertension Mother   . Ulcers Mother   . Heart failure Father   . Pulmonary embolism      Social History:  reports that she quit smoking about 53 years ago. Her smoking use included Cigarettes. She smoked 0.00 packs per day for 6 years. She has never used smokeless tobacco. She reports that she does not drink alcohol or use illicit drugs.  Allergies:  Allergies  Allergen Reactions  . Prednisone Other (See Comments)    Patient had psychiatric episode with hallucinations. Caused 3.5 week long hospitalization & loss of memory   . Carbamazepine Nausea Only and Other (See Comments)    Tegretol; dizziness, blurred vision, nausea, headache, insomnia  . Celebrex [Celecoxib] Other (See Comments)    No relief from pain; ineffective  . Codeine Nausea And Vomiting  . Demerol [Meperidine] Nausea And Vomiting  . Diclofenac Sodium Other (See Comments)    severe headache  . Fentanyl Nausea And Vomiting and Other (See Comments)    IV Fentanyl;  caused nausea and vomiting Fentanyl patch; caused chest pain, HBP, with second patch experienced vertigo and nausea  . Fluoxetine Hcl Nausea Only and Other (See Comments)    dizziness, blurred vision, nausea, headache, insomnia  . Hydromorphone Hcl Nausea And Vomiting  . Ibuprofen Other (See Comments)    Caused ulcers and colitis  . Indomethacin Nausea Only and Other (See Comments)    dizziness, blurred vision, nausea, headache, insomnia  . Lisinopril Cough  . Meperidine Hcl Nausea And Vomiting  . Morphine Nausea And Vomiting  . Oruvail [Ketoprofen] Other (See Comments)    No relief from pain; ineffective  . Pregabalin Other (See Comments)    severe restless legs, insomnia  . Propranolol Hcl Nausea Only and Other (See Comments)    dizziness, blurred vision, nausea, headache, insomnia  . Refresh Lacri-Lube  [Artificial Tears] Swelling    Caused swelling, redness, hard crust on eyelids   . Relafen [Nabumetone] Other (See Comments)    No relief from pain; ineffective  . Ropinirole Hcl Other (See Comments)    severe restless legs and insomnia  . Sulfamethoxazole Nausea Only and Other (See Comments)    gas, loose stools  . Bacitracin Hives, Itching and Rash    redness    Medications: I have reviewed the patient's current medications.  Results for orders placed during the hospital encounter of 10/12/13 (from the past 48 hour(s))  CBC WITH DIFFERENTIAL     Status: None   Collection Time    10/12/13 10:24 AM      Result Value Ref Range   WBC 7.5  4.0 - 10.5 K/uL   RBC 4.13  3.87 - 5.11 MIL/uL   Hemoglobin 13.0  12.0 - 15.0 g/dL   HCT 38.6  36.0 - 46.0 %   MCV 93.5  78.0 - 100.0 fL   MCH 31.5  26.0 - 34.0 pg   MCHC 33.7  30.0 - 36.0 g/dL   RDW 13.5  11.5 - 15.5 %   Platelets 177  150 - 400 K/uL   Neutrophils Relative % 66  43 - 77 %   Neutro Abs 5.0  1.7 - 7.7 K/uL   Lymphocytes Relative 23  12 - 46 %   Lymphs Abs 1.7  0.7 - 4.0 K/uL   Monocytes Relative 9  3 - 12 %   Monocytes Absolute 0.7  0.1 - 1.0 K/uL   Eosinophils Relative 2  0 - 5 %   Eosinophils Absolute 0.2  0.0 - 0.7 K/uL   Basophils Relative 0  0 - 1 %   Basophils Absolute 0.0  0.0 - 0.1 K/uL  BASIC METABOLIC PANEL     Status: Abnormal   Collection Time    10/12/13 10:24 AM      Result Value Ref Range   Sodium 137  137 - 147 mEq/L   Potassium 4.0  3.7 - 5.3 mEq/L   Chloride 98  96 - 112 mEq/L   CO2 26  19 - 32 mEq/L   Glucose, Bld 110 (*) 70 - 99 mg/dL   BUN 13  6 - 23 mg/dL   Creatinine, Ser 0.63  0.50 - 1.10 mg/dL   Calcium 9.4  8.4 - 10.5 mg/dL   GFR calc non Af Amer 83 (*) >90 mL/min   GFR calc Af Amer >90  >90 mL/min   Comment: (NOTE)     The eGFR has been calculated using the CKD EPI equation.     This calculation has  not been validated in all clinical situations.     eGFR's persistently <90 mL/min signify  possible Chronic Kidney     Disease.   Anion gap 13  5 - 15  TROPONIN I     Status: None   Collection Time    10/12/13 10:24 AM      Result Value Ref Range   Troponin I <0.30  <0.30 ng/mL   Comment:            Due to the release kinetics of cTnI,     a negative result within the first hours     of the onset of symptoms does not rule out     myocardial infarction with certainty.     If myocardial infarction is still suspected,     repeat the test at appropriate intervals.  URINALYSIS, ROUTINE W REFLEX MICROSCOPIC     Status: Abnormal   Collection Time    10/12/13 11:07 AM      Result Value Ref Range   Color, Urine AMBER (*) YELLOW   Comment: BIOCHEMICALS MAY BE AFFECTED BY COLOR   APPearance CLEAR  CLEAR   Specific Gravity, Urine 1.031 (*) 1.005 - 1.030   pH 6.0  5.0 - 8.0   Glucose, UA NEGATIVE  NEGATIVE mg/dL   Hgb urine dipstick NEGATIVE  NEGATIVE   Bilirubin Urine NEGATIVE  NEGATIVE   Ketones, ur 15 (*) NEGATIVE mg/dL   Protein, ur NEGATIVE  NEGATIVE mg/dL   Urobilinogen, UA 0.2  0.0 - 1.0 mg/dL   Nitrite NEGATIVE  NEGATIVE   Leukocytes, UA NEGATIVE  NEGATIVE   Comment: MICROSCOPIC NOT DONE ON URINES WITH NEGATIVE PROTEIN, BLOOD, LEUKOCYTES, NITRITE, OR GLUCOSE <1000 mg/dL.    Dg Chest 1 View  10/12/2013   CLINICAL DATA:  Persistent pain after falling 2 weeks prior; hypertension  EXAM: CHEST - 1 VIEW  COMPARISON:  October 07, 2013  FINDINGS: There is no edema or consolidation. Heart size and pulmonary vascularity are normal. No adenopathy. No pneumothorax. There is postoperative change in each shoulder. The compression fracture at T5 is not well seen on this frontal only view.  IMPRESSION: No edema or consolidation.  No apparent pneumothorax.   Electronically Signed   By: Lowella Grip M.D.   On: 10/12/2013 10:24   Dg Thoracic Spine 2 View  10/12/2013   CLINICAL DATA:  Pain for 2 weeks following trauma  EXAM: THORACIC SPINE - 3 VIEW  COMPARISON:  October 07, 2013   FINDINGS: Frontal, lateral, and swimmer's views were obtained. Anterior wedging of the T5 vertebral body remains stable. There is no demonstrable new fracture. There is no spondylolisthesis. There is mild disc space narrowing at multiple levels, stable. No erosive change. There is slight upper thoracic levoscoliosis.  IMPRESSION: Stable anterior wedging of the T5 vertebral body. No new fracture. No spondylolisthesis. Relatively mild osteoarthritic change at multiple levels. There is slight upper thoracic levoscoliosis.   Electronically Signed   By: Lowella Grip M.D.   On: 10/12/2013 10:26   Dg Lumbar Spine Complete  10/12/2013   CLINICAL DATA:  [place] 2 weeks ago. Pain. Limited range motion. Initial evaluation  EXAM: LUMBAR SPINE - COMPLETE 4+ VIEW  COMPARISON:  Bone scan 02/10/2011.  Lumbar spine series 2 /06/2011.  FINDINGS: Lumbar are numbered with the lowest appearing segmented lumbar vertebrae on lateral view as L5. No acute bony abnormality identified. Normal alignment. Diffuse degenerative change, particularly L3-4, L4-5, L5-S1. Mild scoliosis concave right again noted. Aortoiliac  atherosclerotic vascular disease. Surgical clips right upper quadrant and pelvis.  IMPRESSION: 1. Stable lumbar spine degenerative change and scoliosis. 2. No acute abnormality.   Electronically Signed   By: [place]   On: 10/12/2013 10:28   Ct Head Wo Contrast  10/12/2013   CLINICAL DATA:  Fall 2 days ago.  Thoracic compression fracture.  EXAM: CT HEAD WITHOUT CONTRAST  CT MAXILLOFACIAL WITHOUT CONTRAST  CT CERVICAL SPINE WITHOUT CONTRAST  TECHNIQUE: Multidetector CT imaging of the head, cervical spine, and maxillofacial structures were performed using the standard protocol without intravenous contrast. Multiplanar CT image reconstructions of the cervical spine and maxillofacial structures were also generated.  COMPARISON:  02/13/2012.  10/19/2007.  FINDINGS: CT HEAD FINDINGS  Mild chronic ischemic changes in the  periventricular white matter. Mild global atrophy appropriate to age. No mass effect, midline shift, or acute intracranial hemorrhage. Postoperative changes in the right maxillary sinus with chronic mucosal thickening. Postoperative changes in the ethmoid air cells. Mastoid air cells clear. No skull fractures.  CT MAXILLOFACIAL FINDINGS  No acute fracture. No dislocation. Postoperative and chronic changes in the paranasal sinuses. Zygomatic arches are intact. Globes are intact. No orbital or vitreous hemorrhage.  CT CERVICAL SPINE FINDINGS  There is a severe injury at C7-T1. This involves fracture of both C7 lamina, the see set spinous process, and the right T1 transverse process. There is also distraction of the left side of the C7-T1 disc and distraction of the left C7-T1 facet joint. There is slight distraction of the right C7-T1 facet joint associated with kyphosis.  A superior endplate compression fracture of T1 is associated.  There is buckling of the right lamina of C6 compatible with buckle fracture. There is also a nondisplaced fracture of the left C6 lamina adjacent to the spinous process.  No obvious spinal hemorrhage based on CT.  Scattered degenerative changes throughout the cervical spine from C1- C5 are noted. No other fractures are identified above C6.  There is a 2.3 x 2.3 cm cystic area at the medial right apex.  IMPRESSION: Ustable injury at C7 and T1 involving posterior element fractures and ligamentous injury with distraction of the posterior elements and kyphosis.  T1 compression fracture.  Bilateral C6 lamina fractures.  2.3 cm cystic area at the medial left lung apex which may represent a dramatic meningocele. It is nonspecific.  Critical Value/emergent results were called by telephone at the time of interpretation on 10/12/2013 at 10:17 am to Dr.  Berkshire , who verbally acknowledged these results.   Electronically Signed   By: Maryclare Bean M.D.   On: 10/12/2013 10:18   Ct Cervical  Spine Wo Contrast  10/12/2013   CLINICAL DATA:  Fall 2 days ago.  Thoracic compression fracture.  EXAM: CT HEAD WITHOUT CONTRAST  CT MAXILLOFACIAL WITHOUT CONTRAST  CT CERVICAL SPINE WITHOUT CONTRAST  TECHNIQUE: Multidetector CT imaging of the head, cervical spine, and maxillofacial structures were performed using the standard protocol without intravenous contrast. Multiplanar CT image reconstructions of the cervical spine and maxillofacial structures were also generated.  COMPARISON:  02/13/2012.  10/19/2007.  FINDINGS: CT HEAD FINDINGS  Mild chronic ischemic changes in the periventricular white matter. Mild global atrophy appropriate to age. No mass effect, midline shift, or acute intracranial hemorrhage. Postoperative changes in the right maxillary sinus with chronic mucosal thickening. Postoperative changes in the ethmoid air cells. Mastoid air cells clear. No skull fractures.  CT MAXILLOFACIAL FINDINGS  No acute fracture. No dislocation. Postoperative and chronic changes in  the paranasal sinuses. Zygomatic arches are intact. Globes are intact. No orbital or vitreous hemorrhage.  CT CERVICAL SPINE FINDINGS  There is a severe injury at C7-T1. This involves fracture of both C7 lamina, the see set spinous process, and the right T1 transverse process. There is also distraction of the left side of the C7-T1 disc and distraction of the left C7-T1 facet joint. There is slight distraction of the right C7-T1 facet joint associated with kyphosis.  A superior endplate compression fracture of T1 is associated.  There is buckling of the right lamina of C6 compatible with buckle fracture. There is also a nondisplaced fracture of the left C6 lamina adjacent to the spinous process.  No obvious spinal hemorrhage based on CT.  Scattered degenerative changes throughout the cervical spine from C1- C5 are noted. No other fractures are identified above C6.  There is a 2.3 x 2.3 cm cystic area at the medial right apex.  IMPRESSION:  Ustable injury at C7 and T1 involving posterior element fractures and ligamentous injury with distraction of the posterior elements and kyphosis.  T1 compression fracture.  Bilateral C6 lamina fractures.  2.3 cm cystic area at the medial left lung apex which may represent a dramatic meningocele. It is nonspecific.  Critical Value/emergent results were called by telephone at the time of interpretation on 10/12/2013 at 10:17 am to Dr.  Berkshire , who verbally acknowledged these results.   Electronically Signed   By: Maryclare Bean M.D.   On: 10/12/2013 10:18   Ct Maxillofacial Wo Cm  10/12/2013   CLINICAL DATA:  Fall 2 days ago.  Thoracic compression fracture.  EXAM: CT HEAD WITHOUT CONTRAST  CT MAXILLOFACIAL WITHOUT CONTRAST  CT CERVICAL SPINE WITHOUT CONTRAST  TECHNIQUE: Multidetector CT imaging of the head, cervical spine, and maxillofacial structures were performed using the standard protocol without intravenous contrast. Multiplanar CT image reconstructions of the cervical spine and maxillofacial structures were also generated.  COMPARISON:  02/13/2012.  10/19/2007.  FINDINGS: CT HEAD FINDINGS  Mild chronic ischemic changes in the periventricular white matter. Mild global atrophy appropriate to age. No mass effect, midline shift, or acute intracranial hemorrhage. Postoperative changes in the right maxillary sinus with chronic mucosal thickening. Postoperative changes in the ethmoid air cells. Mastoid air cells clear. No skull fractures.  CT MAXILLOFACIAL FINDINGS  No acute fracture. No dislocation. Postoperative and chronic changes in the paranasal sinuses. Zygomatic arches are intact. Globes are intact. No orbital or vitreous hemorrhage.  CT CERVICAL SPINE FINDINGS  There is a severe injury at C7-T1. This involves fracture of both C7 lamina, the see set spinous process, and the right T1 transverse process. There is also distraction of the left side of the C7-T1 disc and distraction of the left C7-T1 facet  joint. There is slight distraction of the right C7-T1 facet joint associated with kyphosis.  A superior endplate compression fracture of T1 is associated.  There is buckling of the right lamina of C6 compatible with buckle fracture. There is also a nondisplaced fracture of the left C6 lamina adjacent to the spinous process.  No obvious spinal hemorrhage based on CT.  Scattered degenerative changes throughout the cervical spine from C1- C5 are noted. No other fractures are identified above C6.  There is a 2.3 x 2.3 cm cystic area at the medial right apex.  IMPRESSION: Ustable injury at C7 and T1 involving posterior element fractures and ligamentous injury with distraction of the posterior elements and kyphosis.  T1 compression fracture.  Bilateral C6 lamina fractures.  2.3 cm cystic area at the medial left lung apex which may represent a dramatic meningocele. It is nonspecific.  Critical Value/emergent results were called by telephone at the time of interpretation on 10/12/2013 at 10:17 am to Dr. Kylinn Shropshire Berkshire , who verbally acknowledged these results.   Electronically Signed   By: Maryclare Bean M.D.   On: 10/12/2013 10:18    Review of Systems - Negative except as above    Blood pressure 130/46, pulse 62, temperature 98.1 F (36.7 C), temperature source Oral, resp. rate 18, height _0  (1.753 m), weight 74.844 kg (165 lb), SpO2 93.00%. Physical Exam Awake, alert, conversant.  Oriented to person, age, place. PERRL, EOMI.  Face symmetric.  Sore in neck, head tipped to right.  Wearing cervical collar.  Full strength both upper and lower extremities without numbness.  Symmetric and intact reflexes.  Assessment/Plan: Patient with unstable C 7 T 1 fracture dislocation.  She is neurologically intact.  She will require posterior stabilization surgery.  Admit to Neuro ICU postop.  Risks and benefits discussed with patient and her husband and they wish to proceed.  Peggyann Shoals, MD 10/12/2013, 1:06 PM

## 2013-10-12 NOTE — ED Notes (Signed)
Dr. Stern at bedside

## 2013-10-12 NOTE — ED Notes (Signed)
Pt not back from scans

## 2013-10-12 NOTE — ED Provider Notes (Signed)
CSN: [id number]     Arrival date & time 10/12/13  0909 History   First MD Initiated Contact with Patient 10/12/13 [phone]     Chief Complaint  Patient presents with  . Back Pain  . Fall     (Consider location/radiation/quality/duration/timing/severity/associated sxs/prior Treatment) HPI Comments: Patient presents to the ER for evaluation of back pain and fall. Patient reports that she was diagnosed with a compression fracture in her back after a fall 2 weeks ago. She reports that she has had progressively worsening pain. He became quite severe this morning, she called EMS because of back pain. Apparently, while waiting for EMS she had another fall. She had her face on her walker. No loss of consciousness. Complains of pain around the right eye.  Patient is a 80 y.o. female presenting with back pain and fall.  Back Pain Fall    Past Medical History  Diagnosis Date  . OSA (obstructive sleep apnea)   . History of bronchitis   . Other diseases of lung, not elsewhere classified   . Hypertension   . CAD (coronary artery disease)     LAD 30% 2006  . History of atrial flutter   . Cerebrovascular disease   . Hyperlipemia   . Obesity   . GERD (gastroesophageal reflux disease)   . Diverticulosis of colon   . IBS (irritable bowel syndrome)   . Colonic polyp   . Crohn's disease   . Hypothyroidism   . History of hyperparathyroidism   . Fibromyalgia   . Osteopenia   . History of headache   . Syncope   . Restless legs syndrome (RLS)   . Psychosis   . History of tuberculosis   . Radiation 05/2008    5000 cGy to left lower eyelid, parotid lymphatics and upper neck  . Memory disturbance   . Bipolar affective     History of psychosis  . Merkel cell tumor     Left eyelid   Past Surgical History  Procedure Laterality Date  . Parathyroid adenoma surgery    . Cholecystectomy    . Moh';s surg left lower eye lid surgery and lid reconstruction surgery  2/10  . Bladder suspension    .  Cataract extraction Bilateral   . Appendectomy    . Tonsillectomy and adenoidectomy    . Lumbar laminectomy    . Incision and drainage perirectal abscess    . Heel spur excision    . Nasal sinus surgery Right     Right maxillary  . Eye lid surgery  04/04/2011    [facility] by Dr. Norlene Duel   Family History  Problem Relation Age of Onset  . Colon cancer      grandmother  . Arthritis Mother   . Hypertension Mother   . Ulcers Mother   . Heart failure Father   . Pulmonary embolism     History  Substance Use Topics  . Smoking status: Former Smoker -- 6 years    Types: Cigarettes    Quit date: 01/08/1960  . Smokeless tobacco: Never Used  . Alcohol Use: No   OB History   Grav Para Term Preterm Abortions TAB SAB Ect Mult Living                 Review of Systems  HENT:       Facial pain  Musculoskeletal: Positive for back pain.  All other systems reviewed and are negative.     Allergies  Prednisone; Carbamazepine; Celebrex;  Codeine; Demerol; Diclofenac sodium; Fentanyl; Fluoxetine hcl; Hydromorphone hcl; Ibuprofen; Indomethacin; Lisinopril; Meperidine hcl; Morphine; [place]; Pregabalin; Propranolol hcl; Refresh lacri-lube; Relafen; Ropinirole hcl; Sulfamethoxazole; and Bacitracin  Home Medications   Prior to Admission medications   Medication Sig Start Date End Date Taking? Authorizing Provider  aspirin EC 81 MG tablet Take 81 mg by mouth every evening.   Yes Historical Provider, MD  atorvastatin (LIPITOR) 40 MG tablet Take 1 tablet (40 mg total) by mouth daily. 08/25/13  Yes [name], MD  Biotin 5000 MCG TABS Take 5,000 mcg by mouth every morning.    Yes Historical Provider, MD  Calcium-Magnesium 500-250 MG TABS Take 2 tablets by mouth daily at 6 PM.  02/18/11  Yes Tammy S Parrett, NP  carvedilol (COREG) 25 MG tablet Take 1 tablet (25 mg total) by mouth 2 (two) times daily with a meal. 10/04/13  Yes Minus Breeding, MD  Cholecalciferol (VITAMIN D3) 2000 UNITS TABS Take  2,000 Units by mouth daily at 12 noon.    Yes Historical Provider, MD  cholestyramine (QUESTRAN) 4 GM/DOSE powder Take 4 g by mouth daily with breakfast. 2 scoops in water at 7 am   Yes Historical Provider, MD  co-enzyme Q-10 50 MG capsule Take 50 mg by mouth daily at 6 PM.    Yes Historical Provider, MD  donepezil (ARICEPT) 5 MG tablet Take 10 mg by mouth daily. Take 2 tablets (10 mg) at 7 am   Yes Historical Provider, MD  Evening Primrose Oil 500 MG CAPS Take 500 mg by mouth 2 (two) times daily.    Yes Historical Provider, MD  fluticasone (FLONASE) 50 MCG/ACT nasal spray Place 2 sprays into both nostrils at bedtime.   Yes Historical Provider, MD  guaifenesin (HUMIBID E) 400 MG TABS Take 400 mg by mouth 2 (two) times daily.    Yes Historical Provider, MD  HYDROcodone-acetaminophen (NORCO/VICODIN) 5-325 MG per tablet Take 1 tablet by mouth every 4 (four) hours as needed for moderate pain.   Yes Historical Provider, MD  ketotifen (THERA TEARS ALLERGY) 0.025 % ophthalmic solution Place 1 drop into both eyes daily as needed (dry eyes).   Yes Historical Provider, MD  levothyroxine (SYNTHROID, LEVOTHROID) 88 MCG tablet Take 1 tablet (88 mcg total) by mouth daily before breakfast. 04/07/13  Yes [name], MD  lidocaine (LIDODERM) 5 % Place 1 patch onto the skin every 12 (twelve) hours as needed (back pain). Remove & Discard patch within 12 hours or as directed by MD   Yes Historical Provider, MD  meclizine (ANTIVERT) 25 MG tablet Take 25 mg by mouth 3 (three) times daily as needed (for vertigo / dizziness).   Yes Historical Provider, MD  mesalamine (LIALDA) 1.2 G EC tablet Take 3.6 g by mouth daily with lunch.    Yes Historical Provider, MD  Multiple Vitamins-Minerals (SUPER VITA-MINS PO) Take 1 tablet by mouth daily at 12 noon.    Yes Historical Provider, MD  Omega 3 1000 MG CAPS Take 1,000 mg by mouth 2 (two) times daily.   Yes Historical Provider, MD  ondansetron (ZOFRAN) 4 MG tablet Take 1 tablet  (4 mg total) by mouth every 8 (eight) hours as needed for nausea or vomiting. 10/08/13  Yes Debby Freiberg, MD  pramipexole (MIRAPEX) 0.5 MG tablet Take 0.5 mg by mouth 2 (two) times daily.   Yes Historical Provider, MD  PROBIOTIC CAPS Take 1 capsule by mouth daily. 02/18/11  Yes Tammy Jeralene Huff, NP  Simethicone (PHAZYME  PO) Take 1-2 tablets by mouth 2 (two) times daily. Take 1 tablet at noon and 2 tablets at 4pm   Yes Historical Provider, MD  vitamin C (ASCORBIC ACID) 500 MG tablet Take 500 mg by mouth daily at 12 noon.    Yes Historical Provider, MD   BP 138/48  Pulse 65  Temp(Src) 98.1 F (36.7 C) (Oral)  Resp 18  Ht 5' 9"  (1.753 m)  Wt 165 lb (74.844 kg)  BMI 24.36 kg/m2  SpO2 92% Physical Exam  Constitutional: She is oriented to person, place, and time. She appears well-developed and well-nourished. No distress.  HENT:  Head: Normocephalic and atraumatic.  Right Ear: Hearing normal.  Left Ear: Hearing normal.  Nose: Nose normal.  Mouth/Throat: Oropharynx is clear and moist and mucous membranes are normal.  Eyes: Conjunctivae and EOM are normal. Pupils are equal, round, and reactive to light.  Superficial abrasions right upper eyelid and below right eye, and no evidence of globe injury  Fluorescein exam with slit-lamp reveals no corneal abrasion, normal cornea  Neck: Normal range of motion. Neck supple.  Cardiovascular: Regular rhythm, S1 normal and S2 normal.  Exam reveals no gallop and no friction rub.   No murmur heard. Pulmonary/Chest: Effort normal and breath sounds normal. No respiratory distress. She exhibits no tenderness.  Abdominal: Soft. Normal appearance and bowel sounds are normal. There is no hepatosplenomegaly. There is no tenderness. There is no rebound, no guarding, no tenderness at McBurney's point and negative Murphy's sign. No hernia.  Musculoskeletal: Normal range of motion.  Neurological: She is alert and oriented to person, place, and time. She has normal  strength. No cranial nerve deficit or sensory deficit. Coordination normal. GCS eye subscore is 4. GCS verbal subscore is 5. GCS motor subscore is 6.  Skin: Skin is warm, dry and intact. No rash noted. No cyanosis.  Psychiatric: She has a normal mood and affect. Her speech is normal and behavior is normal. Thought content normal.    ED Course  Procedures (including critical care time) Labs Review Labs Reviewed  BASIC METABOLIC PANEL - Abnormal; Notable for the following:    Glucose, Bld 110 (*)    GFR calc non Af Amer 83 (*)    All other components within normal limits  URINALYSIS, ROUTINE W REFLEX MICROSCOPIC - Abnormal; Notable for the following:    Color, Urine AMBER (*)    Specific Gravity, Urine 1.031 (*)    Ketones, ur 15 (*)    All other components within normal limits  CBC WITH DIFFERENTIAL  TROPONIN I    Imaging Review Dg Chest 1 View  10/12/2013   CLINICAL DATA:  Persistent pain after falling 2 weeks prior; hypertension  EXAM: CHEST - 1 VIEW  COMPARISON:  October 07, 2013  FINDINGS: There is no edema or consolidation. Heart size and pulmonary vascularity are normal. No adenopathy. No pneumothorax. There is postoperative change in each shoulder. The compression fracture at T5 is not well seen on this frontal only view.  IMPRESSION: No edema or consolidation.  No apparent pneumothorax.   Electronically Signed   By: Lowella Grip M.D.   On: 10/12/2013 10:24   Dg Thoracic Spine 2 View  10/12/2013   CLINICAL DATA:  Pain for 2 weeks following trauma  EXAM: THORACIC SPINE - 3 VIEW  COMPARISON:  October 07, 2013  FINDINGS: Frontal, lateral, and swimmer's views were obtained. Anterior wedging of the T5 vertebral body remains stable. There is no demonstrable new fracture.  There is no spondylolisthesis. There is mild disc space narrowing at multiple levels, stable. No erosive change. There is slight upper thoracic levoscoliosis.  IMPRESSION: Stable anterior wedging of the T5 vertebral  body. No new fracture. No spondylolisthesis. Relatively mild osteoarthritic change at multiple levels. There is slight upper thoracic levoscoliosis.   Electronically Signed   By: Lowella Grip M.D.   On: 10/12/2013 10:26   Dg Lumbar Spine Complete  10/12/2013   CLINICAL DATA:  [place] 2 weeks ago. Pain. Limited range motion. Initial evaluation  EXAM: LUMBAR SPINE - COMPLETE 4+ VIEW  COMPARISON:  Bone scan 02/10/2011.  Lumbar spine series 2 /06/2011.  FINDINGS: Lumbar are numbered with the lowest appearing segmented lumbar vertebrae on lateral view as L5. No acute bony abnormality identified. Normal alignment. Diffuse degenerative change, particularly L3-4, L4-5, L5-S1. Mild scoliosis concave right again noted. Aortoiliac atherosclerotic vascular disease. Surgical clips right upper quadrant and pelvis.  IMPRESSION: 1. Stable lumbar spine degenerative change and scoliosis. 2. No acute abnormality.   Electronically Signed   By: Mimbres   On: 10/12/2013 10:28   Ct Head Wo Contrast  10/12/2013   CLINICAL DATA:  Fall 2 days ago.  Thoracic compression fracture.  EXAM: CT HEAD WITHOUT CONTRAST  CT MAXILLOFACIAL WITHOUT CONTRAST  CT CERVICAL SPINE WITHOUT CONTRAST  TECHNIQUE: Multidetector CT imaging of the head, cervical spine, and maxillofacial structures were performed using the standard protocol without intravenous contrast. Multiplanar CT image reconstructions of the cervical spine and maxillofacial structures were also generated.  COMPARISON:  02/13/2012.  10/19/2007.  FINDINGS: CT HEAD FINDINGS  Mild chronic ischemic changes in the periventricular white matter. Mild global atrophy appropriate to age. No mass effect, midline shift, or acute intracranial hemorrhage. Postoperative changes in the right maxillary sinus with chronic mucosal thickening. Postoperative changes in the ethmoid air cells. Mastoid air cells clear. No skull fractures.  CT MAXILLOFACIAL FINDINGS  No acute fracture. No dislocation.  Postoperative and chronic changes in the paranasal sinuses. Zygomatic arches are intact. Globes are intact. No orbital or vitreous hemorrhage.  CT CERVICAL SPINE FINDINGS  There is a severe injury at C7-T1. This involves fracture of both C7 lamina, the see set spinous process, and the right T1 transverse process. There is also distraction of the left side of the C7-T1 disc and distraction of the left C7-T1 facet joint. There is slight distraction of the right C7-T1 facet joint associated with kyphosis.  A superior endplate compression fracture of T1 is associated.  There is buckling of the right lamina of C6 compatible with buckle fracture. There is also a nondisplaced fracture of the left C6 lamina adjacent to the spinous process.  No obvious spinal hemorrhage based on CT.  Scattered degenerative changes throughout the cervical spine from C1- C5 are noted. No other fractures are identified above C6.  There is a 2.3 x 2.3 cm cystic area at the medial right apex.  IMPRESSION: Ustable injury at C7 and T1 involving posterior element fractures and ligamentous injury with distraction of the posterior elements and kyphosis.  T1 compression fracture.  Bilateral C6 lamina fractures.  2.3 cm cystic area at the medial left lung apex which may represent a dramatic meningocele. It is nonspecific.  Critical Value/emergent results were called by telephone at the time of interpretation on 10/12/2013 at 10:17 am to Dr. Joseph Berkshire , who verbally acknowledged these results.   Electronically Signed   By: Maryclare Bean M.D.   On: 10/12/2013 10:18   Ct  Cervical Spine Wo Contrast  10/12/2013   CLINICAL DATA:  Fall 2 days ago.  Thoracic compression fracture.  EXAM: CT HEAD WITHOUT CONTRAST  CT MAXILLOFACIAL WITHOUT CONTRAST  CT CERVICAL SPINE WITHOUT CONTRAST  TECHNIQUE: Multidetector CT imaging of the head, cervical spine, and maxillofacial structures were performed using the standard protocol without intravenous contrast.  Multiplanar CT image reconstructions of the cervical spine and maxillofacial structures were also generated.  COMPARISON:  02/13/2012.  10/19/2007.  FINDINGS: CT HEAD FINDINGS  Mild chronic ischemic changes in the periventricular white matter. Mild global atrophy appropriate to age. No mass effect, midline shift, or acute intracranial hemorrhage. Postoperative changes in the right maxillary sinus with chronic mucosal thickening. Postoperative changes in the ethmoid air cells. Mastoid air cells clear. No skull fractures.  CT MAXILLOFACIAL FINDINGS  No acute fracture. No dislocation. Postoperative and chronic changes in the paranasal sinuses. Zygomatic arches are intact. Globes are intact. No orbital or vitreous hemorrhage.  CT CERVICAL SPINE FINDINGS  There is a severe injury at C7-T1. This involves fracture of both C7 lamina, the see set spinous process, and the right T1 transverse process. There is also distraction of the left side of the C7-T1 disc and distraction of the left C7-T1 facet joint. There is slight distraction of the right C7-T1 facet joint associated with kyphosis.  A superior endplate compression fracture of T1 is associated.  There is buckling of the right lamina of C6 compatible with buckle fracture. There is also a nondisplaced fracture of the left C6 lamina adjacent to the spinous process.  No obvious spinal hemorrhage based on CT.  Scattered degenerative changes throughout the cervical spine from C1- C5 are noted. No other fractures are identified above C6.  There is a 2.3 x 2.3 cm cystic area at the medial right apex.  IMPRESSION: Ustable injury at C7 and T1 involving posterior element fractures and ligamentous injury with distraction of the posterior elements and kyphosis.  T1 compression fracture.  Bilateral C6 lamina fractures.  2.3 cm cystic area at the medial left lung apex which may represent a dramatic meningocele. It is nonspecific.  Critical Value/emergent results were called by  telephone at the time of interpretation on 10/12/2013 at 10:17 am to Dr. Joseph Berkshire , who verbally acknowledged these results.   Electronically Signed   By: Maryclare Bean M.D.   On: 10/12/2013 10:18   Ct Maxillofacial Wo Cm  10/12/2013   CLINICAL DATA:  Fall 2 days ago.  Thoracic compression fracture.  EXAM: CT HEAD WITHOUT CONTRAST  CT MAXILLOFACIAL WITHOUT CONTRAST  CT CERVICAL SPINE WITHOUT CONTRAST  TECHNIQUE: Multidetector CT imaging of the head, cervical spine, and maxillofacial structures were performed using the standard protocol without intravenous contrast. Multiplanar CT image reconstructions of the cervical spine and maxillofacial structures were also generated.  COMPARISON:  02/13/2012.  10/19/2007.  FINDINGS: CT HEAD FINDINGS  Mild chronic ischemic changes in the periventricular white matter. Mild global atrophy appropriate to age. No mass effect, midline shift, or acute intracranial hemorrhage. Postoperative changes in the right maxillary sinus with chronic mucosal thickening. Postoperative changes in the ethmoid air cells. Mastoid air cells clear. No skull fractures.  CT MAXILLOFACIAL FINDINGS  No acute fracture. No dislocation. Postoperative and chronic changes in the paranasal sinuses. Zygomatic arches are intact. Globes are intact. No orbital or vitreous hemorrhage.  CT CERVICAL SPINE FINDINGS  There is a severe injury at C7-T1. This involves fracture of both C7 lamina, the see set spinous process, and the  right T1 transverse process. There is also distraction of the left side of the C7-T1 disc and distraction of the left C7-T1 facet joint. There is slight distraction of the right C7-T1 facet joint associated with kyphosis.  A superior endplate compression fracture of T1 is associated.  There is buckling of the right lamina of C6 compatible with buckle fracture. There is also a nondisplaced fracture of the left C6 lamina adjacent to the spinous process.  No obvious spinal hemorrhage based  on CT.  Scattered degenerative changes throughout the cervical spine from C1- C5 are noted. No other fractures are identified above C6.  There is a 2.3 x 2.3 cm cystic area at the medial right apex.  IMPRESSION: Ustable injury at C7 and T1 involving posterior element fractures and ligamentous injury with distraction of the posterior elements and kyphosis.  T1 compression fracture.  Bilateral C6 lamina fractures.  2.3 cm cystic area at the medial left lung apex which may represent a dramatic meningocele. It is nonspecific.  Critical Value/emergent results were called by telephone at the time of interpretation on 10/12/2013 at 10:17 am to Dr. Joseph Berkshire , who verbally acknowledged these results.   Electronically Signed   By: Maryclare Bean M.D.   On: 10/12/2013 10:18     EKG Interpretation None      Date: 10/12/2013  Rate: 55  Rhythm: sinus bradycardia  QRS Axis: normal  Intervals: normal  ST/T Wave abnormalities: normal  Conduction Disutrbances:none  Narrative Interpretation: early tranisition of R waves in anterior leads, old  Old EKG Reviewed: unchanged    MDM   Final diagnoses:  None  C7 fracture  Patient presents to the ER for evaluation after a fall. Patient had actually called the ambulance this morning because of pain in her back from a fall that occurred 1 week ago. At that time she was diagnosed with a T5 fracture. Patient reported that she could not bear the pain anymore, called EMS, but then fell while waiting for the annulus to arrival. She did hit the right side of her face on her walker on the way down. She was complaining of pain around the right eye.  Patient did not have any obvious neurologic deficit. Good strength equal both upper extremities. Lower extremities without difficulty. Patient went to radiology for imaging. Ultimately she was found to have an unstable fracture of C7. After returning from radiology, examination was unchanged, no numbness, tingling or  weakness of the upper extremities.  Case discussed with Doctor Fuller Plan, neurosurgery. He has seen the patient in the ER, will admit her for surgery.    Orpah Greek, MD 10/12/13 1246

## 2013-10-12 NOTE — Transfer of Care (Signed)
Immediate Anesthesia Transfer of Care Note  Patient: [name]  Procedure(s) Performed: Procedure(s): Cervical five-Thoracic two cervico-thoracic posterior fusion (N/A)  Patient Location: PACU  Anesthesia Type:General  Level of Consciousness: awake, alert  and oriented  Airway & Oxygen Therapy: Patient Spontanous Breathing and Patient connected to nasal cannula oxygen  Post-op Assessment: Report given to PACU RN and Post -op Vital signs reviewed and stable  Post vital signs: Reviewed and stable  Complications: No apparent anesthesia complications

## 2013-10-12 NOTE — Progress Notes (Signed)
Awake, alert, conversant.  MAEW.  Doing well.

## 2013-10-13 LAB — MRSA PCR SCREENING: MRSA BY PCR: NEGATIVE

## 2013-10-13 MED ORDER — CETYLPYRIDINIUM CHLORIDE 0.05 % MT LIQD
7.0000 mL | Freq: Two times a day (BID) | OROMUCOSAL | Status: DC
  Administered 2013-10-13 – 2013-10-20 (×14): 7 mL via OROMUCOSAL

## 2013-10-13 MED ORDER — TRAMADOL HCL 50 MG PO TABS
50.0000 mg | ORAL_TABLET | Freq: Four times a day (QID) | ORAL | Status: DC | PRN
  Administered 2013-10-13 – 2013-10-16 (×3): 50 mg via ORAL
  Filled 2013-10-13 (×4): qty 1

## 2013-10-13 NOTE — Progress Notes (Signed)
Occupational Therapy Evaluation Patient Details Name: [name] MRN: [id number] DOB: 1932-07-01 Today's Date: 10/13/2013    History of Present Illness recent falls with C7 -T1 fracture. underwent Cervical five-Thoracic two cervico-thoracic posterior fusion (N/A) with pedicle screws T 1 and T 2 and lateral mass screws C 5 and C 6 bilaterally with decompressive laminectomy of C 7 and posterolateral arthrodesis with local autograft and allograft   Clinical Impression   PTA, pt living at home with husband and was mod I with mobility and ADL. # recent falls. Pt will need SNF for rehab prior to transition to ALF. Pt will benefit from skilled OT services to facilitate D/C to SNF due to below deficits.    Follow Up Recommendations  SNF;Supervision/Assistance - 24 hour    Equipment Recommendations  None recommended by OT    Recommendations for Other Services       Precautions / Restrictions Precautions Precautions: Back;Fall;Cervical Precaution Booklet Issued: Yes (comment) Precaution Comments: precautions reviewed before mobilizing Required Braces or Orthoses: Cervical Brace (CTO) Cervical Brace: Hard collar;At all times Restrictions Weight Bearing Restrictions: No      Mobility Bed Mobility Overal bed mobility: Needs Assistance Bed Mobility: Rolling;Sidelying to Sit Rolling: Max assist Sidelying to sit: Max assist       General bed mobility comments: Rolling: pt able to assist with prepositioning of UE and LE. assistance required to maintain back precautions. Sidelying to sit: pt able to assist in pushing up with UE. Assistance required for maintaining precautions.   Transfers Overall transfer level: Needs assistance   Transfers: Sit to/from Stand;Stand Pivot Transfers Sit to Stand: +2 physical assistance;Max assist Stand pivot transfers: Max assist;+2 safety/equipment       General transfer comment: stand pivot bed to [facility]: pt able to weightbear and use UE to push up.  VC required.  Stand pivot BSC to Chair: Pt able to push up with UE.  Max A +2 for stability and safety.     Balance Overall balance assessment: History of Falls;Needs assistance Sitting-balance support: Feet supported;Bilateral upper extremity supported Sitting balance-Leahy Scale: Fair     Standing balance support: During functional activity Standing balance-Leahy Scale: Poor                              ADL Overall ADL's : Needs assistance/impaired Eating/Feeding: Minimal assistance;Set up;Sitting   Grooming: Moderate assistance;Sitting   Upper Body Bathing: Moderate assistance;Sitting   Lower Body Bathing: Total assistance;Sit to/from stand   Upper Body Dressing : Maximal assistance;Sitting   Lower Body Dressing: Total assistance;Sit to/from stand   Toilet Transfer: +2 for safety/equipment;Maximal assistance;Stand-pivot;BSC   Toileting- Clothing Manipulation and Hygiene: Total assistance;Sit to/from stand       Functional mobility during ADLs: Maximal assistance (for transfers only. will need +2 for mobility for safety) General ADL Comments: pt limited by weakness, pain and limitations of immobilization. Began education on cervical precautions. Handout given.     Vision                     Perception     Praxis      Pertinent Vitals/Pain Pain Assessment: 0-10 Pain Score: 8  Pain Location: neck/back Pain Intervention(s): Limited activity within patient's tolerance;Monitored during session;Repositioned     Hand Dominance Right   Extremity/Trunk Assessment Upper Extremity Assessment Upper Extremity Assessment: Generalized weakness   Lower Extremity Assessment Lower Extremity Assessment: Generalized weakness   Cervical /  Trunk Assessment Cervical / Trunk Assessment: Kyphotic   Communication Communication Communication: No difficulties   Cognition Arousal/Alertness: Awake/alert Behavior During Therapy: WFL for tasks  assessed/performed Overall Cognitive Status: Impaired/Different from baseline Area of Impairment:  (appears impaired. ? due to medication. will further assess)                   General Comments   Discussed D/C plan with pt/family and explained difference between ALF and SNF for rehab. Family verbalized understanding.     Exercises       Shoulder Instructions      Home Living Family/patient expects to be discharged to:: Assisted living Living Arrangements: Spouse/significant other                           Home Equipment: Walker - 2 wheels;Shower seat   Additional Comments: PTA, pt living at home with husband. Pt and husband have been accepted to assisted living facility and are considering moving there once pt medically ready      Prior Functioning/Environment Level of Independence: Independent with assistive device(s)        Comments: RW    OT Diagnosis: Generalized weakness;Acute pain   OT Problem List: Decreased strength;Decreased activity tolerance;Impaired balance (sitting and/or standing);Pain   OT Treatment/Interventions: Self-care/ADL training;Therapeutic exercise;DME and/or AE instruction;Therapeutic activities;Patient/family education;Balance training    OT Goals(Current goals can be found in the care plan section) Acute Rehab OT Goals Patient Stated Goal: to get better OT Goal Formulation: With patient/family Time For Goal Achievement: 10/27/13 Potential to Achieve Goals: Good  OT Frequency: Min 2X/week   Barriers to D/C:            Co-evaluation PT/OT/SLP Co-Evaluation/Treatment: Yes Reason for Co-Treatment: Complexity of the patient's impairments (multi-system involvement);For patient/therapist safety PT goals addressed during session: Mobility/safety with mobility;Balance OT goals addressed during session: ADL's and self-care      End of Session Equipment Utilized During Treatment: Gait belt;Back brace;Cervical collar Nurse  Communication: Mobility status  Activity Tolerance: Patient tolerated treatment well Patient left: in chair;with call bell/phone within reach;with family/visitor present   Time: 1340-1416 OT Time Calculation (min): 36 min Charges:  OT General Charges $OT Visit: 1 Procedure OT Evaluation $Initial OT Evaluation Tier I: 1 Procedure OT Treatments $Self Care/Home Management : 23-37 mins G-Codes:    Tayt Moyers,HILLARY 2013-10-17, 3:04 PM   [facility], OTR/L  [phone] 10-17-2013

## 2013-10-13 NOTE — Progress Notes (Signed)
PT Cancellation Note  Patient Details Name: [name] MRN: [id number] DOB: 12-15-1932   Cancelled Treatment:    Reason Eval/Treat Not Completed: Patient not medically ready (awaiting cervical brace extension)   Duncan Dull 10/13/2013, [place], PT DPT  [phone]

## 2013-10-13 NOTE — Evaluation (Signed)
Reviewed assessment and agree with recommendations and POC.  Alben Deeds, [place] DPT  [phone]

## 2013-10-13 NOTE — Progress Notes (Signed)
Subjective: Patient reports feeling OK.  Objective: Vital signs in last 24 hours: Temp:  [97.3 F (36.3 C)-98.6 F (37 C)] 98.4 F (36.9 C) (10/07 0754) Pulse Rate:  [58-105] 58 (10/07 0700) Resp:  [15-30] 19 (10/07 0700) BP: (118-158)/(41-99) 139/53 mmHg (10/07 0700) SpO2:  [92 %-100 %] 93 % (10/07 0700) Weight:  [74.844 kg (165 lb)-78.9 kg (173 lb 15.1 oz)] 78.9 kg (173 lb 15.1 oz) (10/07 0400)  Intake/Output from previous day: 10/06 0701 - 10/07 0700 In: 2195 [I.V.:1695; IV Piggyback:500] Out: 59 [Urine:645; Drains:150; Blood:175] Intake/Output this shift:    Physical Exam: Dressing CDI.  Full strength upper and lower extremities, no numbness.  Lab Results:  Recent Labs  10/12/13 1024  WBC 7.5  HGB 13.0  HCT 38.6  PLT 177   BMET  Recent Labs  10/12/13 1024  NA 137  K 4.0  CL 98  CO2 26  GLUCOSE 110*  BUN 13  CREATININE 0.63  CALCIUM 9.4    Studies/Results: Dg Chest 1 View  10/12/2013   CLINICAL DATA:  Persistent pain after falling 2 weeks prior; hypertension  EXAM: CHEST - 1 VIEW  COMPARISON:  October 07, 2013  FINDINGS: There is no edema or consolidation. Heart size and pulmonary vascularity are normal. No adenopathy. No pneumothorax. There is postoperative change in each shoulder. The compression fracture at T5 is not well seen on this frontal only view.  IMPRESSION: No edema or consolidation.  No apparent pneumothorax.   Electronically Signed   By: Lowella Grip M.D.   On: 10/12/2013 10:24   Dg Cervical Spine 1 View  10/12/2013   CLINICAL DATA:  C7 fracture.  Posterior fusion from C5 through T2.  EXAM: OPERATIVE CERVICAL SPINE 1 VIEW  COMPARISON:  None.  FINDINGS: Single spot image from the C-arm fluoroscopic device, AP view of the cervical spine, is submitted for interpretation postoperatively. This image demonstrates bilateral pedicle screws at C6, C7, T1 and T2.  IMPRESSION: Image obtained during C6 through T2 fusion demonstrating bilateral pedicle  screws at C6, C7, T1 and T2.   Electronically Signed   By: Evangeline Dakin M.D.   On: 10/12/2013 20:12   Dg Thoracic Spine 2 View  10/12/2013   CLINICAL DATA:  Pain for 2 weeks following trauma  EXAM: THORACIC SPINE - 3 VIEW  COMPARISON:  October 07, 2013  FINDINGS: Frontal, lateral, and swimmer's views were obtained. Anterior wedging of the T5 vertebral body remains stable. There is no demonstrable new fracture. There is no spondylolisthesis. There is mild disc space narrowing at multiple levels, stable. No erosive change. There is slight upper thoracic levoscoliosis.  IMPRESSION: Stable anterior wedging of the T5 vertebral body. No new fracture. No spondylolisthesis. Relatively mild osteoarthritic change at multiple levels. There is slight upper thoracic levoscoliosis.   Electronically Signed   By: Lowella Grip M.D.   On: 10/12/2013 10:26   Dg Lumbar Spine Complete  10/12/2013   CLINICAL DATA:  [place] 2 weeks ago. Pain. Limited range motion. Initial evaluation  EXAM: LUMBAR SPINE - COMPLETE 4+ VIEW  COMPARISON:  Bone scan 02/10/2011.  Lumbar spine series 2 /06/2011.  FINDINGS: Lumbar are numbered with the lowest appearing segmented lumbar vertebrae on lateral view as L5. No acute bony abnormality identified. Normal alignment. Diffuse degenerative change, particularly L3-4, L4-5, L5-S1. Mild scoliosis concave right again noted. Aortoiliac atherosclerotic vascular disease. Surgical clips right upper quadrant and pelvis.  IMPRESSION: 1. Stable lumbar spine degenerative change and scoliosis. 2. No acute  abnormality.   Electronically Signed   By: Marcello Moores  Register   On: 10/12/2013 10:28   Ct Head Wo Contrast  10/12/2013   ADDENDUM REPORT: 10/12/2013 13:50  ADDENDUM: At the C7 level, there are bone fragments encroaching upon the central canal from the right lamina fracture.   Electronically Signed   By: Maryclare Bean M.D.   On: 10/12/2013 13:50   10/12/2013   CLINICAL DATA:  Fall 2 days ago.  Thoracic  compression fracture.  EXAM: CT HEAD WITHOUT CONTRAST  CT MAXILLOFACIAL WITHOUT CONTRAST  CT CERVICAL SPINE WITHOUT CONTRAST  TECHNIQUE: Multidetector CT imaging of the head, cervical spine, and maxillofacial structures were performed using the standard protocol without intravenous contrast. Multiplanar CT image reconstructions of the cervical spine and maxillofacial structures were also generated.  COMPARISON:  02/13/2012.  10/19/2007.  FINDINGS: CT HEAD FINDINGS  Mild chronic ischemic changes in the periventricular white matter. Mild global atrophy appropriate to age. No mass effect, midline shift, or acute intracranial hemorrhage. Postoperative changes in the right maxillary sinus with chronic mucosal thickening. Postoperative changes in the ethmoid air cells. Mastoid air cells clear. No skull fractures.  CT MAXILLOFACIAL FINDINGS  No acute fracture. No dislocation. Postoperative and chronic changes in the paranasal sinuses. Zygomatic arches are intact. Globes are intact. No orbital or vitreous hemorrhage.  CT CERVICAL SPINE FINDINGS  There is a severe injury at C7-T1. This involves fracture of both C7 lamina, the see set spinous process, and the right T1 transverse process. There is also distraction of the left side of the C7-T1 disc and distraction of the left C7-T1 facet joint. There is slight distraction of the right C7-T1 facet joint associated with kyphosis.  A superior endplate compression fracture of T1 is associated.  There is buckling of the right lamina of C6 compatible with buckle fracture. There is also a nondisplaced fracture of the left C6 lamina adjacent to the spinous process.  No obvious spinal hemorrhage based on CT.  Scattered degenerative changes throughout the cervical spine from C1- C5 are noted. No other fractures are identified above C6.  There is a 2.3 x 2.3 cm cystic area at the medial right apex.  IMPRESSION: Ustable injury at C7 and T1 involving posterior element fractures and  ligamentous injury with distraction of the posterior elements and kyphosis.  T1 compression fracture.  Bilateral C6 lamina fractures.  2.3 cm cystic area at the medial left lung apex which may represent a dramatic meningocele. It is nonspecific.  Critical Value/emergent results were called by telephone at the time of interpretation on 10/12/2013 at 10:17 am to Dr.  Berkshire , who verbally acknowledged these results.  Electronically Signed: By: Maryclare Bean M.D. On: 10/12/2013 10:18   Ct Cervical Spine Wo Contrast  10/12/2013   ADDENDUM REPORT: 10/12/2013 13:50  ADDENDUM: At the C7 level, there are bone fragments encroaching upon the central canal from the right lamina fracture.   Electronically Signed   By: Maryclare Bean M.D.   On: 10/12/2013 13:50   10/12/2013   CLINICAL DATA:  Fall 2 days ago.  Thoracic compression fracture.  EXAM: CT HEAD WITHOUT CONTRAST  CT MAXILLOFACIAL WITHOUT CONTRAST  CT CERVICAL SPINE WITHOUT CONTRAST  TECHNIQUE: Multidetector CT imaging of the head, cervical spine, and maxillofacial structures were performed using the standard protocol without intravenous contrast. Multiplanar CT image reconstructions of the cervical spine and maxillofacial structures were also generated.  COMPARISON:  02/13/2012.  10/19/2007.  FINDINGS: CT HEAD FINDINGS  Mild chronic  ischemic changes in the periventricular white matter. Mild global atrophy appropriate to age. No mass effect, midline shift, or acute intracranial hemorrhage. Postoperative changes in the right maxillary sinus with chronic mucosal thickening. Postoperative changes in the ethmoid air cells. Mastoid air cells clear. No skull fractures.  CT MAXILLOFACIAL FINDINGS  No acute fracture. No dislocation. Postoperative and chronic changes in the paranasal sinuses. Zygomatic arches are intact. Globes are intact. No orbital or vitreous hemorrhage.  CT CERVICAL SPINE FINDINGS  There is a severe injury at C7-T1. This involves fracture of both C7  lamina, the see set spinous process, and the right T1 transverse process. There is also distraction of the left side of the C7-T1 disc and distraction of the left C7-T1 facet joint. There is slight distraction of the right C7-T1 facet joint associated with kyphosis.  A superior endplate compression fracture of T1 is associated.  There is buckling of the right lamina of C6 compatible with buckle fracture. There is also a nondisplaced fracture of the left C6 lamina adjacent to the spinous process.  No obvious spinal hemorrhage based on CT.  Scattered degenerative changes throughout the cervical spine from C1- C5 are noted. No other fractures are identified above C6.  There is a 2.3 x 2.3 cm cystic area at the medial right apex.  IMPRESSION: Ustable injury at C7 and T1 involving posterior element fractures and ligamentous injury with distraction of the posterior elements and kyphosis.  T1 compression fracture.  Bilateral C6 lamina fractures.  2.3 cm cystic area at the medial left lung apex which may represent a dramatic meningocele. It is nonspecific.  Critical Value/emergent results were called by telephone at the time of interpretation on 10/12/2013 at 10:17 am to Dr.  Berkshire , who verbally acknowledged these results.  Electronically Signed: By: Maryclare Bean M.D. On: 10/12/2013 10:18   Dg C-arm 61-120 Min  10/12/2013   CLINICAL DATA:  C7 fracture.  Posterior fusion from C5 through T2.  EXAM: OPERATIVE CERVICAL SPINE 1 VIEW  COMPARISON:  None.  FINDINGS: Single spot image from the C-arm fluoroscopic device, AP view of the cervical spine, is submitted for interpretation postoperatively. This image demonstrates bilateral pedicle screws at C6, C7, T1 and T2.  IMPRESSION: Image obtained during C6 through T2 fusion demonstrating bilateral pedicle screws at C6, C7, T1 and T2.   Electronically Signed   By: Evangeline Dakin M.D.   On: 10/12/2013 20:12   Ct Maxillofacial Wo Cm  10/12/2013   ADDENDUM REPORT:  10/12/2013 13:50  ADDENDUM: At the C7 level, there are bone fragments encroaching upon the central canal from the right lamina fracture.   Electronically Signed   By: Maryclare Bean M.D.   On: 10/12/2013 13:50   10/12/2013   CLINICAL DATA:  Fall 2 days ago.  Thoracic compression fracture.  EXAM: CT HEAD WITHOUT CONTRAST  CT MAXILLOFACIAL WITHOUT CONTRAST  CT CERVICAL SPINE WITHOUT CONTRAST  TECHNIQUE: Multidetector CT imaging of the head, cervical spine, and maxillofacial structures were performed using the standard protocol without intravenous contrast. Multiplanar CT image reconstructions of the cervical spine and maxillofacial structures were also generated.  COMPARISON:  02/13/2012.  10/19/2007.  FINDINGS: CT HEAD FINDINGS  Mild chronic ischemic changes in the periventricular white matter. Mild global atrophy appropriate to age. No mass effect, midline shift, or acute intracranial hemorrhage. Postoperative changes in the right maxillary sinus with chronic mucosal thickening. Postoperative changes in the ethmoid air cells. Mastoid air cells clear. No skull fractures.  CT  MAXILLOFACIAL FINDINGS  No acute fracture. No dislocation. Postoperative and chronic changes in the paranasal sinuses. Zygomatic arches are intact. Globes are intact. No orbital or vitreous hemorrhage.  CT CERVICAL SPINE FINDINGS  There is a severe injury at C7-T1. This involves fracture of both C7 lamina, the see set spinous process, and the right T1 transverse process. There is also distraction of the left side of the C7-T1 disc and distraction of the left C7-T1 facet joint. There is slight distraction of the right C7-T1 facet joint associated with kyphosis.  A superior endplate compression fracture of T1 is associated.  There is buckling of the right lamina of C6 compatible with buckle fracture. There is also a nondisplaced fracture of the left C6 lamina adjacent to the spinous process.  No obvious spinal hemorrhage based on CT.  Scattered  degenerative changes throughout the cervical spine from C1- C5 are noted. No other fractures are identified above C6.  There is a 2.3 x 2.3 cm cystic area at the medial right apex.  IMPRESSION: Ustable injury at C7 and T1 involving posterior element fractures and ligamentous injury with distraction of the posterior elements and kyphosis.  T1 compression fracture.  Bilateral C6 lamina fractures.  2.3 cm cystic area at the medial left lung apex which may represent a dramatic meningocele. It is nonspecific.  Critical Value/emergent results were called by telephone at the time of interpretation on 10/12/2013 at 10:17 am to Dr. Ren Aspinall Berkshire , who verbally acknowledged these results.  Electronically Signed: By: Maryclare Bean M.D. On: 10/12/2013 10:18    Assessment/Plan: Continue drain today.  D/C foley.  May transfer to floor later today, obtain chest extension for cervical collar.  D/C Foley.  Will need PT to work with mobility and balance.  Will need Social Work consult for placement, given concerns regarding patient safety, considering multiple ground level falls at home.    LOS: 1 day    Peggyann Shoals, MD 10/13/2013, 8:02 AM

## 2013-10-13 NOTE — Evaluation (Signed)
Physical Therapy Evaluation Patient Details Name: [name] MRN: [id number] DOB: 07-07-1932 Today's Date: 10/13/2013   History of Present Illness  recent falls with C7 -T1 fracture. underwent Cervical five-Thoracic two cervico-thoracic posterior fusion (N/A) with pedicle screws T 1 and T 2 and lateral mass screws C 5 and C 6 bilaterally with decompressive laminectomy of C 7 and posterolateral arthrodesis with local autograft and allograft  Clinical Impression  PTA, pt mod I with all mobility. Currently, pt requires max A +2 for transfers and max A of 1 for bed mobility. Pt would benefit from acute skilled PT service to improve safety with mobility and address functional deficits noted below. Back precautions reviewed prior to mobilizing and handout provided by OT.  D/c plan discussed with pt and family, all agreeable to rehab. Recommend SNF.     Follow Up Recommendations SNF;Supervision/Assistance - 24 hour    Equipment Recommendations  Other (comment) (tbd)    Recommendations for Other Services       Precautions / Restrictions Precautions Precautions: Back;Fall;Cervical Precaution Booklet Issued: Yes (comment) Precaution Comments: precautions reviewed before mobilizing Required Braces or Orthoses: Cervical Brace (CTO) Cervical Brace: Hard collar;At all times Restrictions Weight Bearing Restrictions: No      Mobility  Bed Mobility Overal bed mobility: Needs Assistance Bed Mobility: Rolling;Sidelying to Sit Rolling: Max assist Sidelying to sit: Max assist       General bed mobility comments: Rolling: pt able to assist with prepositioning of UE and LE. assistance required to maintain back precautions. Sidelying to sit: pt able to assist in pushing up with UE. Assistance required for maintaining precautions.   Transfers Overall transfer level: Needs assistance   Transfers: Sit to/from Stand;Stand Pivot Transfers Sit to Stand: +2 physical assistance;Max assist Stand pivot  transfers: Max assist;+2 physical assistance       General transfer comment: stand pivot bed to [facility]: pt able to weightbear and use UE to push up. VC required.  Stand pivot BSC to Chair: Pt able to push up with UE.  Max A +2 for stability and safety.   Ambulation/Gait                Stairs            Wheelchair Mobility    Modified Rankin (Stroke Patients Only)       Balance Overall balance assessment: History of Falls Sitting-balance support: Feet supported;Bilateral upper extremity supported Sitting balance-Leahy Scale: Fair     Standing balance support: During functional activity Standing balance-Leahy Scale: Poor                               Pertinent Vitals/Pain Pain Assessment: 0-10 Pain Score: 8  Pain Location: neck/back Pain Intervention(s): Limited activity within patient's tolerance;Monitored during session;Repositioned    Home Living Family/patient expects to be discharged to:: Assisted living Living Arrangements: Spouse/significant other             Home Equipment: Walker - 2 wheels;Shower seat Additional Comments: PTA, pt living at home with husband. Pt and husband have been accepted to assisted living facility and are considering moving there once pt medically ready    Prior Function Level of Independence: Independent with assistive device(s)         Comments: RW fior mobility, bed rails, and shower chair     Hand Dominance   Dominant Hand: Right    Extremity/Trunk Assessment   Upper Extremity Assessment:  Defer to OT evaluation           Lower Extremity Assessment: Generalized weakness      Cervical / Trunk Assessment: Kyphotic  Communication   Communication: No difficulties  Cognition Arousal/Alertness: Awake/alert Behavior During Therapy: WFL for tasks assessed/performed Overall Cognitive Status: Impaired/Different from baseline (possibly related to medication)                     General  Comments General comments (skin integrity, edema, etc.): abrasion R supraoccipital and on R knee. No reports of dizziness during mobility. No pain at start of session. Once sitting in chair, pt reporting pain "8 or 9"/10 with diaphoresis. Pt provided with damp cloth for forehead and reposition to reduce pain. POC and d/c plan discussed with pt and family, agreeable with rehab at [facility] and requesting more information from social work.     Exercises        Assessment/Plan    PT Assessment Patient needs continued PT services  PT Diagnosis Difficulty walking;Acute pain;Generalized weakness   PT Problem List Decreased strength;Decreased activity tolerance;Decreased balance;Decreased mobility;Pain  PT Treatment Interventions Gait training;DME instruction;Functional mobility training;Therapeutic activities;Therapeutic exercise;Balance training;Patient/family education   PT Goals (Current goals can be found in the Care Plan section) Acute Rehab PT Goals Patient Stated Goal: to get better PT Goal Formulation: With patient/family Time For Goal Achievement: 10/27/13 Potential to Achieve Goals: Good    Frequency Min 5X/week   Barriers to discharge        Co-evaluation PT/OT/SLP Co-Evaluation/Treatment: Yes Reason for Co-Treatment: For patient/therapist safety;Complexity of the patient's impairments (multi-system involvement) PT goals addressed during session: Mobility/safety with mobility OT goals addressed during session: ADL's and self-care       End of Session Equipment Utilized During Treatment: Gait belt;Cervical collar (CTO) Activity Tolerance: Patient tolerated treatment well Patient left: in chair;with call bell/phone within reach Nurse Communication: Mobility status (pain, use of BSC)         Time: 2706-2376 PT Time Calculation (min): 38 min   Charges:         PT G Codes:          Alsie Younes 10/13/2013, 3:36 PM Levonne Hubert, SPT

## 2013-10-13 NOTE — Progress Notes (Signed)
Pt arrived to floor via bed at 2210. Alert and oriented x4.No distress noted.Oriented to room, call bell, and bed alarm. Pt made comfortable, all questions answered. Will continue to monitor. Verdie Drown RN BSN

## 2013-10-13 NOTE — Progress Notes (Signed)
Nurse called patient's husband and notified about the patient transfer to [facility]  Room 4N27.   K Kolangayil James/RN, BSN, [employer]

## 2013-10-14 ENCOUNTER — Ambulatory Visit: Payer: Medicare Other | Admitting: Family Medicine

## 2013-10-14 MED ORDER — VANCOMYCIN HCL 10 G IV SOLR
1250.0000 mg | INTRAVENOUS | Status: DC
  Administered 2013-10-15: 1250 mg via INTRAVENOUS
  Filled 2013-10-14 (×3): qty 1250

## 2013-10-14 NOTE — Clinical Social Work Note (Signed)
Clinical Social Work Department BRIEF PSYCHOSOCIAL ASSESSMENT 10/14/2013  Patient:  [name], [name]     Account Number:  [id number]     Admit date:  10/12/2013  Clinical Social Worker:  Myles Lipps  Date/Time:  10/13/2013 04:00 PM  Referred by:  Physician  Date Referred:  10/13/2013 Referred for  SNF Placement   Other Referral:   Interview type:  Patient Other interview type:   No family/friends at bedside    PSYCHOSOCIAL DATA Living Status:  ALONE Admitted from facility:   Level of care:   Primary support name:  Beckham, Buxbaum  [phone] Primary support relationship to patient:  SPOUSE Degree of support available:   Strong    CURRENT CONCERNS Current Concerns  Post-Acute Placement   Other Concerns:    SOCIAL WORK ASSESSMENT / PLAN Clinical Social Worker met with patient at bedside to offer support and discuss patient plans at discharge.  Patient states that she lives at home with her husband and is no longer able to care for herself at home.  Patient states that her husband has been working on arrangements for [facility] explained that therapies are recommending a higher level of care with plans to transition to ALF.  Patient verbalized understanding and agreement of SNF need.  CSW to complete FL2 and initiate SNF search in [facility]  CSW to follow up with patient and family regarding available bed offers.  CSW remains available for support and to facilitate patient discharge needs once medically stable.   Assessment/plan status:  Psychosocial Support/Ongoing Assessment of Needs Other assessment/ plan:   Information/referral to community resources:   Clinical Social Worker to provide patient and family with facility list once bed offers are available.  No additional resources needed at this time.    PATIENT'S/FAMILY'S RESPONSE TO PLAN OF CARE: Patient alert and oriented x3 sitting up in the chair. Patient with good family support and realistic  regarding patient need for long term support.  Patient verbalized understanding of CSW role and appreciation for support. Patient in a great deal of pain and has requested RN for patient medication - CSW updated RN.

## 2013-10-14 NOTE — Clinical Social Work Note (Addendum)
Clinical Social Work Department CLINICAL SOCIAL WORK PLACEMENT NOTE 10/14/2013  Patient:  [name], [name]  Account Number:  [id number] Admit date:  10/12/2013  Clinical Social Worker:  Barbette Or, LCSW  Date/time:  10/13/2013 04:00 PM  Clinical Social Work is seeking post-discharge placement for this patient at the following level of care:   [place]   (*CSW will update this form in Epic as items are completed)   10/13/2013  Patient/family provided with [place] Department of Clinical Social Work's list of facilities offering this level of care within the geographic area requested by the patient (or if unable, by the patient's family).  10/13/2013  Patient/family informed of their freedom to choose among providers that offer the needed level of care, that participate in Medicare, Medicaid or managed care program needed by the patient, have an available bed and are willing to accept the patient.  10/13/2013  Patient/family informed of [facility]' ownership interest in [facility], as well as of the fact that they are under no obligation to receive care at this facility.  PASARR submitted to EDS on 10/13/2013 PASARR number received on   FL2 transmitted to all facilities in geographic area requested by pt/family on  10/13/2013 FL2 transmitted to all facilities within larger geographic area on   Patient informed that his/her managed care company has contracts with or will negotiate with  certain facilities, including the following:     Patient/family informed of bed offers received:  10/14/2013 Patient chooses bed at [facility]  Physician recommends and patient chooses bed at    Patient to be transferred to [facility] on  10/20/2013 Patient to be transferred to facility by [facility] Patient and family notified of transfer on 10/20/2013 Name of family member notified:  Mr. Geisel and daughter [name]  The following physician request were entered in  Epic:   Additional Comments:

## 2013-10-14 NOTE — Progress Notes (Signed)
Physical Therapy Treatment Patient Details Name: [name] MRN: [id number] DOB: 1932-09-24 Today's Date: 10/14/2013    History of Present Illness recent falls with C7 -T1 fracture. underwent Cervical five-Thoracic two cervico-thoracic posterior fusion PMHx-CAD, cerebrovascular disease, fibromyalgia, osteopenia, bipolar disorder, tumor removed lt eyelid    PT Comments    Pt continues with poor balance with stepping (leans strongly to her Rt--which she does not do in static sitting or standing). Sat EOB x 8 minutes with RN in to assess her drain that became detached from the collection "bulb." During sitting, pt fatigued significantly with +sway in all directions (almost appeared to be truncal ataxia). Pt with h/o multiple recent falls and may need to rely on a wheelchair for locomotion at this time.    Follow Up Recommendations  SNF;Supervision/Assistance - 24 hour     Equipment Recommendations  Other (comment) (tbd)    Recommendations for Other Services       Precautions / Restrictions Precautions Precautions: Back;Fall;Cervical Precaution Comments: precautions reviewed before mobilizing Required Braces or Orthoses: Cervical Brace (CTO) Cervical Brace: Hard collar;At all times Restrictions Weight Bearing Restrictions: No    Mobility  Bed Mobility Overal bed mobility: Needs Assistance Bed Mobility: Rolling;Sidelying to Sit Rolling: Min assist Sidelying to sit: Mod assist;HOB elevated       General bed mobility comments: rolled to her Rt with vc for sequencing; vc for technique side to sit (with assist due to pain/weakness)  Transfers Overall transfer level: Needs assistance Equipment used: 2 person hand held assist Transfers: Sit to/from Stand Sit to Stand: Mod assist;+2 physical assistance         General transfer comment: Pt requires assist to shift over her BOS and to extend hips into full standing; assist to control descent; stood x 2 for up to 60  seconds  Ambulation/Gait Ambulation/Gait assistance: Mod assist;+2 physical assistance Ambulation Distance (Feet): 2 Feet Assistive device: 2 person hand held assist Gait Pattern/deviations: Step-to pattern;Shuffle;Drifts right/left     General Gait Details: pt does not want to use RW ("I don't trust it"). Reports she fell to her Rt when using RW. Pt with definite Rt lean as she began stepping toward chair on her rt. Pt held onto PT and tech's forearms as we assisted her.   Stairs            Wheelchair Mobility    Modified Rankin (Stroke Patients Only)       Balance Overall balance assessment: History of Falls;Needs assistance Sitting-balance support: No upper extremity supported;Feet supported Sitting balance-Leahy Scale: Poor Sitting balance - Comments: + sway in sitting with minguard assist for safety    Standing balance support: Bilateral upper extremity supported Standing balance-Leahy Scale: Poor Standing balance comment: static standing she is in midline, however with stepping she begins to lean to her RT                    Cognition Arousal/Alertness: Awake/alert Behavior During Therapy: WFL for tasks assessed/performed Overall Cognitive Status: Within Functional Limits for tasks assessed                      Exercises      General Comments General comments (skin integrity, edema, etc.): At rest, she denies pain. Husband present throughout session      Pertinent Vitals/Pain Pain Assessment: 0-10 Pain Score: 6  Pain Location: neck Pain Intervention(s): Limited activity within patient's tolerance;Monitored during session;Repositioned    Home Living  Prior Function            PT Goals (current goals can now be found in the care plan section) Acute Rehab PT Goals Patient Stated Goal: to get better    Frequency  Min 3X/week    PT Plan Current plan remains appropriate    Co-evaluation PT/OT/SLP  Co-Evaluation/Treatment: Yes           End of Session Equipment Utilized During Treatment: Gait belt;Cervical collar (CTO) Activity Tolerance: Patient tolerated treatment well Patient left: in chair;with call bell/phone within reach;with chair alarm set;with family/visitor present     Time: 7425-9563 PT Time Calculation (min): 19 min  Charges:  $Therapeutic Activity: 8-22 mins                    G Codes:      [name] 10/26/2013, 2:56 PM Pager [phone]

## 2013-10-14 NOTE — Progress Notes (Signed)
ANTIBIOTIC CONSULT NOTE -follow up  Pharmacy Consult for Vancomycin Indication: Post op laminectomy with a drain  Allergies  Allergen Reactions  . Prednisone Other (See Comments)    Patient had psychiatric episode with hallucinations. Caused 3.5 week long hospitalization & loss of memory   . Carbamazepine Nausea Only and Other (See Comments)    Tegretol; dizziness, blurred vision, nausea, headache, insomnia  . Celebrex [Celecoxib] Other (See Comments)    No relief from pain; ineffective  . Codeine Nausea And Vomiting  . Demerol [Meperidine] Nausea And Vomiting  . Diclofenac Sodium Other (See Comments)    severe headache  . Fentanyl Nausea And Vomiting and Other (See Comments)    IV Fentanyl; caused nausea and vomiting Fentanyl patch; caused chest pain, HBP, with second patch experienced vertigo and nausea  . Fluoxetine Hcl Nausea Only and Other (See Comments)    dizziness, blurred vision, nausea, headache, insomnia  . Hydromorphone Hcl Nausea And Vomiting  . Ibuprofen Other (See Comments)    Caused ulcers and colitis  . Indomethacin Nausea Only and Other (See Comments)    dizziness, blurred vision, nausea, headache, insomnia  . Lisinopril Cough  . Meperidine Hcl Nausea And Vomiting  . Morphine Nausea And Vomiting  . Oruvail [Ketoprofen] Other (See Comments)    No relief from pain; ineffective  . Pregabalin Other (See Comments)    severe restless legs, insomnia  . Propranolol Hcl Nausea Only and Other (See Comments)    dizziness, blurred vision, nausea, headache, insomnia  . Refresh Lacri-Lube [Artificial Tears] Swelling    Caused swelling, redness, hard crust on eyelids   . Relafen [Nabumetone] Other (See Comments)    No relief from pain; ineffective  . Ropinirole Hcl Other (See Comments)    severe restless legs and insomnia  . Sulfamethoxazole Nausea Only and Other (See Comments)    gas, loose stools  . Bacitracin Hives, Itching and Rash    redness    Patient  Measurements: Height: 5\' 9"  (175.3 cm) Weight: 173 lb (78.472 kg) IBW/kg (Calculated) : 66.2   Vital Signs: Temp: 98.4 F (36.9 C) (10/08 1432) Temp Source: Oral (10/08 1432) BP: 147/57 mmHg (10/08 1432) Pulse Rate: 57 (10/08 1432) Intake/Output from previous day: 10/07 0701 - 10/08 0700 In: 575 [P.O.:150; I.V.:225; IV Piggyback:200] Out: 605 [Urine:450; Drains:155] Intake/Output from this shift: Total I/O In: 480 [P.O.:480] Out: -   Labs:  Recent Labs  10/12/13 1024  WBC 7.5  HGB 13.0  PLT 177  CREATININE 0.63   Estimated Creatinine Clearance: 59.6 ml/min (by C-G formula based on Cr of 0.63). No results found for this basename: VANCOTROUGH, Corlis Leak, VANCORANDOM, GENTTROUGH, GENTPEAK, GENTRANDOM, TOBRATROUGH, TOBRAPEAK, TOBRARND, AMIKACINPEAK, AMIKACINTROU, AMIKACIN,  in the last 72 hours   Microbiology: Recent Results (from the past 720 hour(s))  MRSA PCR SCREENING     Status: None   Collection Time    10/12/13  9:38 PM      Result Value Ref Range Status   MRSA by PCR NEGATIVE  NEGATIVE Final   Comment:            The GeneXpert MRSA Assay (FDA     approved for NASAL specimens     only), is one component of a     comprehensive MRSA colonization     surveillance program. It is not     intended to diagnose MRSA     infection nor to guide or     monitor treatment for  MRSA infections.    Medical History: Past Medical History  Diagnosis Date  . OSA (obstructive sleep apnea)   . History of bronchitis   . Other diseases of lung, not elsewhere classified   . Hypertension   . CAD (coronary artery disease)     LAD 30% 2006  . History of atrial flutter   . Cerebrovascular disease   . Hyperlipemia   . Obesity   . GERD (gastroesophageal reflux disease)   . Diverticulosis of colon   . IBS (irritable bowel syndrome)   . Colonic polyp   . Crohn's disease   . Hypothyroidism   . History of hyperparathyroidism   . Fibromyalgia   . Osteopenia   . History  of headache   . Syncope   . Restless legs syndrome (RLS)   . Psychosis   . History of tuberculosis   . Radiation 05/2008    5000 cGy to left lower eyelid, parotid lymphatics and upper neck  . Memory disturbance   . Bipolar affective     History of psychosis  . Merkel cell tumor     Left eyelid    Medications:  Scheduled:  . antiseptic oral rinse  7 mL Mouth Rinse BID  . aspirin EC  81 mg Oral QPM  . atorvastatin  40 mg Oral q1800  . calcium carbonate  1 tablet Oral Q breakfast  . carvedilol  25 mg Oral BID WC  . cholecalciferol  2,000 Units Oral Daily  . cholestyramine  4 g Oral Q1200  . docusate sodium  100 mg Oral BID  . donepezil  10 mg Oral QAC breakfast  . fluticasone  2 spray Each Nare QHS  . guaiFENesin  400 mg Oral BID  . levothyroxine  88 mcg Oral QAC breakfast  . magnesium oxide  400 mg Oral Daily  . mesalamine  3.6 g Oral Q lunch  . pantoprazole (PROTONIX) IV  40 mg Intravenous QHS  . pramipexole  0.5 mg Oral BID  . saccharomyces boulardii  250 mg Oral Daily  . senna  1 tablet Oral BID  . sodium chloride  3 mL Intravenous Q12H  . sodium chloride  3 mL Intravenous Q12H  . [START ON 10/15/2013] vancomycin  1,250 mg Intravenous Q24H  . vancomycin  1,000 mg Intravenous Q12H  . vitamin C  500 mg Oral Q1200   Assessment: 80 y.o Female on Vancomycin IV for closed system drain s/p spinal fusion surgery on 10/6 post a fall a couple of weeks ago.  SCr 0.63 on 10/6, no labs since 10/6, CrCl ~ 60 ml/min. I/O 650/555 yesterday, today 480/430. No cultures pending.   Goal of Therapy:  Vanc trough 10-15  Plan:  Adjust Vancomycin to 1250 mg IV q24hrs.   Nicole Cella, RPh Clinical Pharmacist Pager: [phone] 10/14/2013,3:04 PM

## 2013-10-14 NOTE — Progress Notes (Signed)
Subjective: Patient reports "They told me I couldn't go home to live"  Objective: Vital signs in last 24 hours: Temp:  [97.5 F (36.4 C)-98.9 F (37.2 C)] 98.2 F (36.8 C) (10/08 0655) Pulse Rate:  [50-70] 59 (10/08 0655) Resp:  [14-23] 18 (10/08 0655) BP: (118-144)/(39-51) 144/49 mmHg (10/08 0655) SpO2:  [92 %-99 %] 99 % (10/08 0655) Weight:  [78.472 kg (173 lb)] 78.472 kg (173 lb) (10/08 0500)  Intake/Output from previous day: 10/07 0701 - 10/08 0700 In: 575 [P.O.:150; I.V.:225; IV Piggyback:200] Out: 605 [Urine:450; Drains:155] Intake/Output this shift: Total I/O In: 240 [P.O.:240] Out: -   Alert, reports incisional/shoulder pain as expected. Incision without erythema, swellng or drainage beneath honeycomb drsg. Hemovac ~22ml overnight per nurse.   Lab Results:  Recent Labs  10/12/13 1024  WBC 7.5  HGB 13.0  HCT 38.6  PLT 177   BMET  Recent Labs  10/12/13 1024  NA 137  K 4.0  CL 98  CO2 26  GLUCOSE 110*  BUN 13  CREATININE 0.63  CALCIUM 9.4    Studies/Results: Dg Chest 1 View  10/12/2013   CLINICAL DATA:  Persistent pain after falling 2 weeks prior; hypertension  EXAM: CHEST - 1 VIEW  COMPARISON:  October 07, 2013  FINDINGS: There is no edema or consolidation. Heart size and pulmonary vascularity are normal. No adenopathy. No pneumothorax. There is postoperative change in each shoulder. The compression fracture at T5 is not well seen on this frontal only view.  IMPRESSION: No edema or consolidation.  No apparent pneumothorax.   Electronically Signed   By: Lowella Grip M.D.   On: 10/12/2013 10:24   Dg Cervical Spine 1 View  10/12/2013   CLINICAL DATA:  C7 fracture.  Posterior fusion from C5 through T2.  EXAM: OPERATIVE CERVICAL SPINE 1 VIEW  COMPARISON:  None.  FINDINGS: Single spot image from the C-arm fluoroscopic device, AP view of the cervical spine, is submitted for interpretation postoperatively. This image demonstrates bilateral pedicle screws at  C6, C7, T1 and T2.  IMPRESSION: Image obtained during C6 through T2 fusion demonstrating bilateral pedicle screws at C6, C7, T1 and T2.   Electronically Signed   By: Evangeline Dakin M.D.   On: 10/12/2013 20:12   Dg Thoracic Spine 2 View  10/12/2013   CLINICAL DATA:  Pain for 2 weeks following trauma  EXAM: THORACIC SPINE - 3 VIEW  COMPARISON:  October 07, 2013  FINDINGS: Frontal, lateral, and swimmer's views were obtained. Anterior wedging of the T5 vertebral body remains stable. There is no demonstrable new fracture. There is no spondylolisthesis. There is mild disc space narrowing at multiple levels, stable. No erosive change. There is slight upper thoracic levoscoliosis.  IMPRESSION: Stable anterior wedging of the T5 vertebral body. No new fracture. No spondylolisthesis. Relatively mild osteoarthritic change at multiple levels. There is slight upper thoracic levoscoliosis.   Electronically Signed   By: Lowella Grip M.D.   On: 10/12/2013 10:26   Dg Lumbar Spine Complete  10/12/2013   CLINICAL DATA:  [place] 2 weeks ago. Pain. Limited range motion. Initial evaluation  EXAM: LUMBAR SPINE - COMPLETE 4+ VIEW  COMPARISON:  Bone scan 02/10/2011.  Lumbar spine series 2 /06/2011.  FINDINGS: Lumbar are numbered with the lowest appearing segmented lumbar vertebrae on lateral view as L5. No acute bony abnormality identified. Normal alignment. Diffuse degenerative change, particularly L3-4, L4-5, L5-S1. Mild scoliosis concave right again noted. Aortoiliac atherosclerotic vascular disease. Surgical clips right upper quadrant and  pelvis.  IMPRESSION: 1. Stable lumbar spine degenerative change and scoliosis. 2. No acute abnormality.   Electronically Signed   By: Marcello Moores  Register   On: 10/12/2013 10:28   Ct Head Wo Contrast  10/12/2013   ADDENDUM REPORT: 10/12/2013 13:50  ADDENDUM: At the C7 level, there are bone fragments encroaching upon the central canal from the right lamina fracture.   Electronically Signed   By:  Maryclare Bean M.D.   On: 10/12/2013 13:50   10/12/2013   CLINICAL DATA:  Fall 2 days ago.  Thoracic compression fracture.  EXAM: CT HEAD WITHOUT CONTRAST  CT MAXILLOFACIAL WITHOUT CONTRAST  CT CERVICAL SPINE WITHOUT CONTRAST  TECHNIQUE: Multidetector CT imaging of the head, cervical spine, and maxillofacial structures were performed using the standard protocol without intravenous contrast. Multiplanar CT image reconstructions of the cervical spine and maxillofacial structures were also generated.  COMPARISON:  02/13/2012.  10/19/2007.  FINDINGS: CT HEAD FINDINGS  Mild chronic ischemic changes in the periventricular white matter. Mild global atrophy appropriate to age. No mass effect, midline shift, or acute intracranial hemorrhage. Postoperative changes in the right maxillary sinus with chronic mucosal thickening. Postoperative changes in the ethmoid air cells. Mastoid air cells clear. No skull fractures.  CT MAXILLOFACIAL FINDINGS  No acute fracture. No dislocation. Postoperative and chronic changes in the paranasal sinuses. Zygomatic arches are intact. Globes are intact. No orbital or vitreous hemorrhage.  CT CERVICAL SPINE FINDINGS  There is a severe injury at C7-T1. This involves fracture of both C7 lamina, the see set spinous process, and the right T1 transverse process. There is also distraction of the left side of the C7-T1 disc and distraction of the left C7-T1 facet joint. There is slight distraction of the right C7-T1 facet joint associated with kyphosis.  A superior endplate compression fracture of T1 is associated.  There is buckling of the right lamina of C6 compatible with buckle fracture. There is also a nondisplaced fracture of the left C6 lamina adjacent to the spinous process.  No obvious spinal hemorrhage based on CT.  Scattered degenerative changes throughout the cervical spine from C1- C5 are noted. No other fractures are identified above C6.  There is a 2.3 x 2.3 cm cystic area at the medial right  apex.  IMPRESSION: Ustable injury at C7 and T1 involving posterior element fractures and ligamentous injury with distraction of the posterior elements and kyphosis.  T1 compression fracture.  Bilateral C6 lamina fractures.  2.3 cm cystic area at the medial left lung apex which may represent a dramatic meningocele. It is nonspecific.  Critical Value/emergent results were called by telephone at the time of interpretation on 10/12/2013 at 10:17 am to Dr. Joseph Berkshire , who verbally acknowledged these results.  Electronically Signed: By: Maryclare Bean M.D. On: 10/12/2013 10:18   Ct Cervical Spine Wo Contrast  10/12/2013   ADDENDUM REPORT: 10/12/2013 13:50  ADDENDUM: At the C7 level, there are bone fragments encroaching upon the central canal from the right lamina fracture.   Electronically Signed   By: Maryclare Bean M.D.   On: 10/12/2013 13:50   10/12/2013   CLINICAL DATA:  Fall 2 days ago.  Thoracic compression fracture.  EXAM: CT HEAD WITHOUT CONTRAST  CT MAXILLOFACIAL WITHOUT CONTRAST  CT CERVICAL SPINE WITHOUT CONTRAST  TECHNIQUE: Multidetector CT imaging of the head, cervical spine, and maxillofacial structures were performed using the standard protocol without intravenous contrast. Multiplanar CT image reconstructions of the cervical spine and maxillofacial structures were also generated.  COMPARISON:  02/13/2012.  10/19/2007.  FINDINGS: CT HEAD FINDINGS  Mild chronic ischemic changes in the periventricular white matter. Mild global atrophy appropriate to age. No mass effect, midline shift, or acute intracranial hemorrhage. Postoperative changes in the right maxillary sinus with chronic mucosal thickening. Postoperative changes in the ethmoid air cells. Mastoid air cells clear. No skull fractures.  CT MAXILLOFACIAL FINDINGS  No acute fracture. No dislocation. Postoperative and chronic changes in the paranasal sinuses. Zygomatic arches are intact. Globes are intact. No orbital or vitreous hemorrhage.  CT CERVICAL  SPINE FINDINGS  There is a severe injury at C7-T1. This involves fracture of both C7 lamina, the see set spinous process, and the right T1 transverse process. There is also distraction of the left side of the C7-T1 disc and distraction of the left C7-T1 facet joint. There is slight distraction of the right C7-T1 facet joint associated with kyphosis.  A superior endplate compression fracture of T1 is associated.  There is buckling of the right lamina of C6 compatible with buckle fracture. There is also a nondisplaced fracture of the left C6 lamina adjacent to the spinous process.  No obvious spinal hemorrhage based on CT.  Scattered degenerative changes throughout the cervical spine from C1- C5 are noted. No other fractures are identified above C6.  There is a 2.3 x 2.3 cm cystic area at the medial right apex.  IMPRESSION: Ustable injury at C7 and T1 involving posterior element fractures and ligamentous injury with distraction of the posterior elements and kyphosis.  T1 compression fracture.  Bilateral C6 lamina fractures.  2.3 cm cystic area at the medial left lung apex which may represent a dramatic meningocele. It is nonspecific.  Critical Value/emergent results were called by telephone at the time of interpretation on 10/12/2013 at 10:17 am to Dr. Joseph Berkshire , who verbally acknowledged these results.  Electronically Signed: By: Maryclare Bean M.D. On: 10/12/2013 10:18   Dg C-arm 61-120 Min  10/12/2013   CLINICAL DATA:  C7 fracture.  Posterior fusion from C5 through T2.  EXAM: OPERATIVE CERVICAL SPINE 1 VIEW  COMPARISON:  None.  FINDINGS: Single spot image from the C-arm fluoroscopic device, AP view of the cervical spine, is submitted for interpretation postoperatively. This image demonstrates bilateral pedicle screws at C6, C7, T1 and T2.  IMPRESSION: Image obtained during C6 through T2 fusion demonstrating bilateral pedicle screws at C6, C7, T1 and T2.   Electronically Signed   By: Evangeline Dakin M.D.    On: 10/12/2013 20:12   Ct Maxillofacial Wo Cm  10/12/2013   ADDENDUM REPORT: 10/12/2013 13:50  ADDENDUM: At the C7 level, there are bone fragments encroaching upon the central canal from the right lamina fracture.   Electronically Signed   By: Maryclare Bean M.D.   On: 10/12/2013 13:50   10/12/2013   CLINICAL DATA:  Fall 2 days ago.  Thoracic compression fracture.  EXAM: CT HEAD WITHOUT CONTRAST  CT MAXILLOFACIAL WITHOUT CONTRAST  CT CERVICAL SPINE WITHOUT CONTRAST  TECHNIQUE: Multidetector CT imaging of the head, cervical spine, and maxillofacial structures were performed using the standard protocol without intravenous contrast. Multiplanar CT image reconstructions of the cervical spine and maxillofacial structures were also generated.  COMPARISON:  02/13/2012.  10/19/2007.  FINDINGS: CT HEAD FINDINGS  Mild chronic ischemic changes in the periventricular white matter. Mild global atrophy appropriate to age. No mass effect, midline shift, or acute intracranial hemorrhage. Postoperative changes in the right maxillary sinus with chronic mucosal thickening. Postoperative changes in  the ethmoid air cells. Mastoid air cells clear. No skull fractures.  CT MAXILLOFACIAL FINDINGS  No acute fracture. No dislocation. Postoperative and chronic changes in the paranasal sinuses. Zygomatic arches are intact. Globes are intact. No orbital or vitreous hemorrhage.  CT CERVICAL SPINE FINDINGS  There is a severe injury at C7-T1. This involves fracture of both C7 lamina, the see set spinous process, and the right T1 transverse process. There is also distraction of the left side of the C7-T1 disc and distraction of the left C7-T1 facet joint. There is slight distraction of the right C7-T1 facet joint associated with kyphosis.  A superior endplate compression fracture of T1 is associated.  There is buckling of the right lamina of C6 compatible with buckle fracture. There is also a nondisplaced fracture of the left C6 lamina adjacent to  the spinous process.  No obvious spinal hemorrhage based on CT.  Scattered degenerative changes throughout the cervical spine from C1- C5 are noted. No other fractures are identified above C6.  There is a 2.3 x 2.3 cm cystic area at the medial right apex.  IMPRESSION: Ustable injury at C7 and T1 involving posterior element fractures and ligamentous injury with distraction of the posterior elements and kyphosis.  T1 compression fracture.  Bilateral C6 lamina fractures.  2.3 cm cystic area at the medial left lung apex which may represent a dramatic meningocele. It is nonspecific.  Critical Value/emergent results were called by telephone at the time of interpretation on 10/12/2013 at 10:17 am to Dr. Joseph Berkshire , who verbally acknowledged these results.  Electronically Signed: By: Maryclare Bean M.D. On: 10/12/2013 10:18    Assessment/Plan: Improving   LOS: 2 days  Continue Vista collar with chest extension. Mobilize with PT/OT.  SNF planning.   Verdis Prime 10/14/2013, 9:17 AM

## 2013-10-14 NOTE — Progress Notes (Signed)
Patient doing well.  Will need placement.

## 2013-10-15 ENCOUNTER — Other Ambulatory Visit: Payer: Self-pay | Admitting: Pulmonary Disease

## 2013-10-15 ENCOUNTER — Encounter (HOSPITAL_COMMUNITY): Payer: Self-pay | Admitting: Neurosurgery

## 2013-10-15 MED ORDER — BISACODYL 10 MG RE SUPP: 10.0000 mg | Freq: Every day | RECTAL | Status: DC | PRN

## 2013-10-15 NOTE — Progress Notes (Signed)
Continued improvement, will need placement

## 2013-10-15 NOTE — Clinical Social Work Note (Signed)
CSW provider bed offers to the family. The pt's and her husband reported that they needed to discuss the bed offer with their daughter. CSW is awaiting the family decision.   [place], MSW, [place]

## 2013-10-15 NOTE — Progress Notes (Signed)
Occupational Therapy Treatment Patient Details Name: [name] MRN: [id number] DOB: 16-Mar-1932 Today's Date: 10/15/2013    History of present illness recent falls with C7 -T1 fracture. underwent Cervical five-Thoracic two cervico-thoracic posterior fusion 10/15/13 on return to sidelying, pt reported vertigo lasting <15 seconds--likely BPPV after fall. PMHx-CAD, cerebrovascular disease, fibromyalgia, osteopenia, bipolar disorder, tumor removed lt eyelid   OT comments  Pt demonstrates red foam with oral care in chair and educated on build up handle. Pt very anxious with all mobility. Ot to follow acutely for adl retraining and static standing balance.    Follow Up Recommendations  SNF;Supervision/Assistance - 24 hour    Equipment Recommendations  None recommended by OT    Recommendations for Other Services      Precautions / Restrictions Precautions Precautions: Fall;Back;Cervical Required Braces or Orthoses: Cervical Brace Cervical Brace: Hard collar;At all times       Mobility Bed Mobility Overal bed mobility: Needs Assistance Bed Mobility: Supine to Sit Rolling: Supervision   Supine to sit: Min assist   Sit to sidelying: Min assist General bed mobility comments: cuse for cervical log roll precaution  Transfers Overall transfer level: Needs assistance Equipment used: 1 person hand held assist Transfers: Sit to/from [employer] Transfers Sit to Stand: Mod assist Stand pivot transfers: Mod assist       General transfer comment: cues for hand placement and to decr speed. pt attempting to move quickly and descend to surface rapidly without any UB reaching.     Balance Overall balance assessment: Needs assistance         Standing balance support: Bilateral upper extremity supported;During functional activity Standing balance-Leahy Scale: Poor                     ADL Overall ADL's : Needs assistance/impaired     Grooming: Minimal  assistance;Sitting Grooming Details (indicate cue type and reason): provided red foam and requries BIL UE to brush teeth. Pt reports "i dont like those" when recommended to use a electric tooth brush for vibration to help with grasp and incr handle size                 Toilet Transfer: Moderate assistance;Stand-pivot;BSC Toilet Transfer Details (indicate cue type and reason): very anxious and reports "i broke my neck falling going to the toilet before. " Pt insisting on bed pan and OT encouraging 3n1. Pt ultimately sitting on BSC Toileting- Clothing Manipulation and Hygiene: Min guard;Sitting/lateral lean Toileting - Clothing Manipulation Details (indicate cue type and reason): peri care seated     Functional mobility during ADLs:  (stand pivot only ) General ADL Comments: Pt transferred EOB to [facility] to the rigth and off BSC to chair left. Pt very anxious with all mobility . Pt expresses fear of falling multiple times during session. pt completing oral care in chair      Vision                     Perception     Praxis      Cognition   Behavior During Therapy: Anxious Overall Cognitive Status: Within Functional Limits for tasks assessed                       Extremity/Trunk Assessment               Exercises General Exercises - Lower Extremity Ankle Circles/Pumps: AROM;Both;20 reps Quad Sets: AROM;Both;20 reps Heel Slides: AROM;Right;10 reps  Other Exercises Other Exercises: hand pumps Other Exercises: Given handout that also included sitting LAQ and seated marching   Shoulder Instructions       General Comments      Pertinent Vitals/ Pain       Pain Assessment: 0-10 Pain Score: 4  Pain Location: neck and back Pain Intervention(s): Limited activity within patient's tolerance  Home Living                                          Prior Functioning/Environment              Frequency Min 2X/week     Progress Toward  Goals  OT Goals(current goals can now be found in the care plan section)  Progress towards OT goals: Progressing toward goals  Acute Rehab OT Goals Patient Stated Goal: to get better OT Goal Formulation: With patient/family Time For Goal Achievement: 10/27/13 Potential to Achieve Goals: Good ADL Goals Pt Will Perform Grooming: with set-up;with supervision;sitting Pt Will Perform Upper Body Bathing: with set-up;with supervision;sitting Pt Will Transfer to Toilet: with min assist;bedside commode Additional ADL Goal #1: Bed mobility with min A in preparation for ADL  Plan Discharge plan remains appropriate    Co-evaluation                 End of Session Equipment Utilized During Treatment: Gait belt;Back brace;Cervical collar   Activity Tolerance Patient tolerated treatment well   Patient Left in chair;with call bell/phone within reach;with bed alarm set   Nurse Communication Mobility status        Time: 1791-5056 OT Time Calculation (min): 19 min  Charges: OT General Charges $OT Visit: 1 Procedure OT Treatments $Self Care/Home Management : 8-22 mins  Peri Maris 10/15/2013, 4:26 PM Pager: [phone]

## 2013-10-15 NOTE — Progress Notes (Signed)
Physical Therapy Treatment Patient Details Name: [name] MRN: [id number] DOB: Nov 07, 1932 Today's Date: 10/15/2013    History of Present Illness recent falls with C7 -T1 fracture. underwent Cervical five-Thoracic two cervico-thoracic posterior fusion 10/15/13 on return to sidelying, pt reported vertigo lasting <15 seconds--likely BPPV after fall. PMHx-CAD, cerebrovascular disease, fibromyalgia, osteopenia, bipolar disorder, tumor removed lt eyelid    PT Comments    Pt willing to learn bed exercises as she was just returning to bed with nursing on arrival. Educated on general ROM/strengthening exercises to assist with pain and edema management (especially for Rt knee). See notes below re: likely BPPV and pt deferring assessment or repositioning at this time--would be difficult with recent fx and neck surgery. Encouraged use of meclizine to decr symptoms as vertigo will put her at higher risk for falling.    Follow Up Recommendations  SNF;Supervision/Assistance - 24 hour     Equipment Recommendations  None recommended by PT    Recommendations for Other Services       Precautions / Restrictions Precautions Precautions: Fall;Back;Cervical Required Braces or Orthoses: Cervical Brace Cervical Brace: Hard collar;At all times    Mobility  Bed Mobility Overal bed mobility: Needs Assistance Bed Mobility: Sit to Sidelying;Rolling Rolling: Supervision       Sit to sidelying: Min assist General bed mobility comments: pt returning to bed with nursing on arrival; required vc for technique to minimize strain on neck  Transfers                    Ambulation/Gait                 Stairs            Wheelchair Mobility    Modified Rankin (Stroke Patients Only)       Balance                                    Cognition Arousal/Alertness: Awake/alert Behavior During Therapy: WFL for tasks assessed/performed Overall Cognitive Status: Within  Functional Limits for tasks assessed                      Exercises General Exercises - Lower Extremity Ankle Circles/Pumps: AROM;Both;20 reps Quad Sets: AROM;Both;20 reps Heel Slides: AROM;Right;10 reps Other Exercises Other Exercises: hand pumps Other Exercises: Given handout that also included sitting LAQ and seated marching    General Comments General comments (skin integrity, edema, etc.): On return to sidelying, pt reporting vertigo lasting <15 seconds (no nystagmus noted, although dark in room). Pt reports h/o vertigo. Both occurences sound like BPPV (and highly likely given recent fall). Explained repositioning maneuvers and that they can be done with bed repositioning (trendelenburg and rolling her body (since we cannot turn her head/neck) and she declined this treatment. Noted MD has meclizine ordered TID PRN and asked for it to be scheduled. Pt reporting swelling in Rt knee and remains fairly swollen with abrasions previously noted. able to perform exercises without incr pain and educated to repeat 2-3 times per day to decr edema      Pertinent Vitals/Pain Pain Assessment: 0-10 Pain Score: 4  Pain Location: neck and back Pain Intervention(s): Limited activity within patient's tolerance;Repositioned    Home Living                      Prior Function  PT Goals (current goals can now be found in the care plan section) Acute Rehab PT Goals Patient Stated Goal: to get better Progress towards PT goals: Progressing toward goals    Frequency  Min 3X/week    PT Plan Current plan remains appropriate    Co-evaluation             End of Session Equipment Utilized During Treatment: Cervical collar Activity Tolerance: Patient tolerated treatment well Patient left: in bed;with call bell/phone within reach     Time: 1537-1600 PT Time Calculation (min): 23 min  Charges:  $Therapeutic Exercise: 8-22 mins $Therapeutic Activity: 8-22  mins                    G Codes:      Lakaisha Danish 2013-11-10, 4:14 PM Pager [phone]

## 2013-10-15 NOTE — Progress Notes (Signed)
CSW: Please call [name] (pt daughter)  with updates regarding placement over the weekend.  Pt and family are most interested in [place] with [facility] as second choice if there is a bed available.    Her phone number is [phone] (cell).

## 2013-10-15 NOTE — Progress Notes (Signed)
Subjective: Patient reports "I feel ok.  I think the social worker is coming this morning to talk with [place]"  Objective: Vital signs in last 24 hours: Temp:  [97.5 F (36.4 C)-98.9 F (37.2 C)] 98.3 F (36.8 C) (10/09 0647) Pulse Rate:  [56-67] 67 (10/09 0647) Resp:  [18-19] 18 (10/09 0647) BP: (123-167)/(35-61) 167/54 mmHg (10/09 0647) SpO2:  [96 %-99 %] 96 % (10/09 0150) Weight:  [80.1 kg (176 lb 9.4 oz)] 80.1 kg (176 lb 9.4 oz) (10/09 0500)  Intake/Output from previous day: 10/08 0701 - 10/09 0700 In: 600 [P.O.:600] Out: 300 [Urine:300] Intake/Output this shift:    Alert, conversant. Husband present. Good strength, MAEW; but leaning to right with ambulation with PT. Hemovac dry, pulled. Incision with Dermabond, no erythema, swelling, or drainage.   Lab Results:  Recent Labs  10/12/13 1024  WBC 7.5  HGB 13.0  HCT 38.6  PLT 177   BMET  Recent Labs  10/12/13 1024  NA 137  K 4.0  CL 98  CO2 26  GLUCOSE 110*  BUN 13  CREATININE 0.63  CALCIUM 9.4    Studies/Results: No results found.  Assessment/Plan: Improving   LOS: 3 days  Ok to pull hemovac per DrStern; pulled & DSD to drain site. Vista collar with chest extension in use. SNF planned. Continue to mobilize with PT.    Verdis Prime 10/15/2013, 8:41 AM

## 2013-10-16 MED ORDER — PANTOPRAZOLE SODIUM 40 MG PO TBEC
40.0000 mg | DELAYED_RELEASE_TABLET | Freq: Every day | ORAL | Status: DC
  Administered 2013-10-16 – 2013-10-19 (×4): 40 mg via ORAL
  Filled 2013-10-16 (×4): qty 1

## 2013-10-16 MED ORDER — OXYCODONE HCL 5 MG PO TABS
10.0000 mg | ORAL_TABLET | ORAL | Status: DC | PRN
  Administered 2013-10-16 – 2013-10-20 (×11): 10 mg via ORAL
  Filled 2013-10-16 (×11): qty 2

## 2013-10-16 NOTE — Progress Notes (Signed)
Weekend CSW (Holiday representative) notified by nursing staff that family would like pt to dc to [facility] when medically stable. Unit CSW to follow up with facilities on Monday or at time of discharge.   [place], [place]

## 2013-10-16 NOTE — Progress Notes (Signed)
Patient ID: [name], female   DOB: 24-May-1932, 80 y.o.   MRN: [id number] Afeb, vss No new neuro issues.  Has not been out of bed Says pain is not being controlled. Will make some changes and try to increase activity.

## 2013-10-17 NOTE — Progress Notes (Signed)
Patient ID: [name], female   DOB: Sep 18, 1932, 80 y.o.   MRN: [id number] Subjective:  The patient is alert and pleasant. She complains that her orthosis pushes her neck.  Objective: Vital signs in last 24 hours: Temp:  [97.5 F (36.4 C)-98.7 F (37.1 C)] 98.1 F (36.7 C) (10/11 1011) Pulse Rate:  [54-68] 64 (10/11 1011) Resp:  [15-18] 18 (10/11 1011) BP: (110-140)/(39-64) 125/64 mmHg (10/11 1011) SpO2:  [92 %-97 %] 97 % (10/11 1011) Weight:  [75.7 kg (166 lb 14.2 oz)] 75.7 kg (166 lb 14.2 oz) (10/11 0539)  Intake/Output from previous day:   Intake/Output this shift:    Physical exam the patient is alert and oriented. She is moving all 4 extremities.  Lab Results: No results found for this basename: WBC, HGB, HCT, PLT,  in the last 72 hours BMET No results found for this basename: NA, K, CL, CO2, GLUCOSE, BUN, CREATININE, CALCIUM,  in the last 72 hours  Studies/Results: No results found.  Assessment/Plan: Cervical fracture: We will ask [employer] to adjust her brace. We are awaiting placement.  LOS: 5 days     Latiqua Daloia D 10/17/2013, 12:06 PM

## 2013-10-18 NOTE — Progress Notes (Signed)
Patient ID: [name], female   DOB: 1932-03-20, 80 y.o.   MRN: [id number] Subjective:  the patient is alert and pleasant. She looks better today. I am told she has been separated and a skilled nursing facility for transfer tomorrow.  Objective: Vital signs in last 24 hours: Temp:  [98.2 F (36.8 C)-98.7 F (37.1 C)] 98.5 F (36.9 C) (10/12 1729) Pulse Rate:  [58-71] 65 (10/12 1729) Resp:  [16-18] 18 (10/12 1729) BP: (105-124)/(35-73) 120/47 mmHg (10/12 1729) SpO2:  [91 %-99 %] 95 % (10/12 1729) Weight:  [75.7 kg (166 lb 14.2 oz)] 75.7 kg (166 lb 14.2 oz) (10/12 0517)  Intake/Output from previous day:   Intake/Output this shift: Total I/O In: 360 [P.O.:360] Out: -   Physical exam the patient is alert and oriented x3. She is moving all 4 extremities well.  Lab Results: No results found for this basename: WBC, HGB, HCT, PLT,  in the last 72 hours BMET No results found for this basename: NA, K, CL, CO2, GLUCOSE, BUN, CREATININE, CALCIUM,  in the last 72 hours  Studies/Results: No results found.  Assessment/Plan: Postop day #6: We're awaiting skilled nursing facility placement. I've spoken to the patient's family   LOS: 6 days     Makhari Dovidio D 10/18/2013, 5:41 PM

## 2013-10-18 NOTE — Progress Notes (Signed)
Physical Therapy Treatment Patient Details Name: [name] MRN: [id number] DOB: 11/05/1932 Today's Date: 10/18/2013    History of Present Illness recent falls with C7 -T1 fracture. underwent Cervical five-Thoracic two cervico-thoracic posterior fusion 10/15/13 on return to [facility], pt reported vertigo lasting <15 seconds--likely BPPV after fall. PMHx-CAD, cerebrovascular disease, fibromyalgia, osteopenia, bipolar disorder, tumor removed lt eyelid    PT Comments    Pt with less anxiety today and easily agreeable to get OOB to recliner, however declined further gait.  Pt was educated on LE therex she could perform in chair.  Pt verbalizing her appreciation of all the staff.  Husband present and supportive.  Follow Up Recommendations  SNF;Supervision/Assistance - 24 hour     Equipment Recommendations  None recommended by PT    Recommendations for Other Services       Precautions / Restrictions Precautions Precautions: Fall;Back;Cervical Precaution Comments: precautions reviewed before mobilizing Required Braces or Orthoses: Cervical Brace Cervical Brace: Hard collar;At all times Restrictions Weight Bearing Restrictions: No    Mobility  Bed Mobility Overal bed mobility: Needs Assistance Bed Mobility: Rolling;Sidelying to Sit Rolling: Supervision Sidelying to sit: Min guard;HOB elevated       General bed mobility comments: Pt with good log rolling technique  Transfers Overall transfer level: Needs assistance Equipment used: Rolling walker (2 wheeled) Transfers: Sit to/from Stand Sit to Stand: Min assist Stand pivot transfers: Min assist (with RW)       General transfer comment: Cues for breathing  Ambulation/Gait Ambulation/Gait assistance: Min assist Ambulation Distance (Feet): 2 Feet (bed > recliner) Assistive device: Rolling walker (2 wheeled) Gait Pattern/deviations: Step-to pattern         Stairs            Wheelchair Mobility    Modified  Rankin (Stroke Patients Only)       Balance   Sitting-balance support: No upper extremity supported Sitting balance-Leahy Scale: Fair     Standing balance support: Bilateral upper extremity supported Standing balance-Leahy Scale: Poor                      Cognition Arousal/Alertness: Awake/alert Behavior During Therapy: WFL for tasks assessed/performed Overall Cognitive Status: Within Functional Limits for tasks assessed                      Exercises General Exercises - Lower Extremity Ankle Circles/Pumps: AROM;Both;20 reps Long Arc Quad: 15 reps;Seated;Both Hip ABduction/ADduction: Both;15 reps;Seated Hip Flexion/Marching: Both;15 reps;Seated    General Comments        Pertinent Vitals/Pain Pain Assessment: 0-10 Pain Score: 4  Pain Location: back Pain Intervention(s): Limited activity within patient's tolerance    Home Living                      Prior Function            PT Goals (current goals can now be found in the care plan section) Acute Rehab PT Goals Patient Stated Goal: to get better PT Goal Formulation: With patient/family Time For Goal Achievement: 10/27/13 Potential to Achieve Goals: Good Progress towards PT goals: Progressing toward goals    Frequency  Min 3X/week    PT Plan Current plan remains appropriate    Co-evaluation             End of Session Equipment Utilized During Treatment: Cervical collar Activity Tolerance: Patient tolerated treatment well Patient left: in chair;with call bell/phone within reach;with family/visitor  present;with nursing/sitter in room     Time: 0937-1000 PT Time Calculation (min): 23 min  Charges:  $Therapeutic Exercise: 8-22 mins $Therapeutic Activity: 8-22 mins                    G Codes:      [name] 10/18/2013, 10:49 AM

## 2013-10-18 NOTE — Progress Notes (Signed)
Occupational Therapy Treatment Patient Details Name: [name] MRN: [id number] DOB: [birth year] Today's Date: 10/18/2013    History of present illness recent falls with C7 -T1 fracture. underwent Cervical five-Thoracic two cervico-thoracic posterior fusion 10/15/13 on return to [facility], pt reported vertigo lasting <15 seconds--likely BPPV after fall. PMHx-CAD, cerebrovascular disease, fibromyalgia, osteopenia, bipolar disorder, tumor removed lt eyelid   OT comments  Pt and daughter educated on proper fit of brace during session. Pt verbalized discomfort with cervical collar and unfortunately pt has a 24/7 order to wear brace. Pt and daughter advised of "at all times" order. Pt with poor recall of precautions or previous events. Pt demonstrates some cognitive deficits that were likely present prior to admission. Question: cognitive status and underlying deficits that family report as "normal". Pt will need constant repetitive  reinforcement. Ot to follow acutely for UE HEP, balance, and adl retraining. Pt and family could benefit from further education on brace.    Follow Up Recommendations  SNF;Supervision/Assistance - 24 hour    Equipment Recommendations  None recommended by OT    Recommendations for Other Services      Precautions / Restrictions Precautions Precautions: Fall;Back;Cervical Precaution Comments: precautions reviewed before mobilizing Required Braces or Orthoses: Cervical Brace Cervical Brace: Hard collar;At all times       Mobility Bed Mobility               General bed mobility comments: in chair on arrival  Transfers Overall transfer level: Needs assistance Equipment used: 1 person hand held assist Transfers: Stand Pivot Transfers;Sit to/from Stand Sit to Stand: Min assist Stand pivot transfers: Min assist       General transfer comment: cues for hand placement and sequence. Pt prefer therapist with anterior transfer approach instead of RW. pt  very anxious with RW. Pt with previous fall with toilet transfer.     Balance Overall balance assessment: Needs assistance         Standing balance support: Bilateral upper extremity supported;During functional activity Standing balance-Leahy Scale: Poor                     ADL Overall ADL's : Needs assistance/impaired Eating/Feeding: Minimal assistance;Sitting Eating/Feeding Details (indicate cue type and reason): Pt with red foam applied to spoon. Daughter present and reports "my mom said I have to feed her she can't do it" Family and patient educated on attempting to complete with patient by hersefl first then with help with fatigue. pt is able to self feed but becomes frustrated due to challenge. pt with pillows placed under bil UE to help with proper chair positioning.                      Toilet Transfer: Moderate assistance;Stand-pivot;BSC Toilet Transfer Details (indicate cue type and reason): cues for hand placement and side stepping Toileting- Clothing Manipulation and Hygiene: Maximal assistance;Sit to/from stand Toileting - Clothing Manipulation Details (indicate cue type and reason): Pt required (A) for all peri care. Pt reports inability to perform front peri care this session. Pt attempting sitting and standing.        General ADL Comments: Pt in chair in a reclined position in the chair and in need of proper 90 degree sitting. Pt verbalized need to void bladder so BSC transfer performed first instead of repositioning. PT with gown don over brace. Pt with brace properly don over brace and x2 small blisters noted in thoracic area from brace being directly on  skin. Daughter and pt educated on proper don of brace over clothing and not on bare skin. Daughter with long handle back scratcher. Daughter educate not to use hard metal claw scratcher on patient skin and okay to instead use hand in a small circle motion on direct skin. Pt positioned at 90 degree angle  seated in chair. Pt presented with lunch. RN [place] entering room. Patient and RN made aware that post lunch transfer to bed is needed due to x2 hours in chair at this time. Pt encouraged to call RN with call bell.      Vision                     Perception     Praxis      Cognition   Behavior During Therapy: [facility] for tasks assessed/performed Overall Cognitive Status: Impaired/Different from baseline Area of Impairment: Attention;Memory;Following commands;Safety/judgement;Awareness;Problem solving   Current Attention Level: Sustained Memory: Decreased recall of precautions;Decreased short-term memory  Following Commands: Follows one step commands with increased time Safety/Judgement: Decreased awareness of safety;Decreased awareness of deficits Awareness: Intellectual Problem Solving: Slow processing;Decreased initiation;Difficulty sequencing;Requires verbal cues;Requires tactile cues General Comments: Pt with no recall of previous OT session. PT no recall of "red" foam education or why its present in the room. Pt needed step by step cues for basic transfer . Pt with poor sequencing. pt when challenged cognitively reports "I can't remember anything". Pt with no recall of precautions    Extremity/Trunk Assessment               Exercises     Shoulder Instructions       General Comments      Pertinent Vitals/ Pain       Pain Assessment: 0-10 Pain Score: 4  Pain Location: back Pain Intervention(s): Premedicated before session;Repositioned  Home Living                                          Prior Functioning/Environment              Frequency Min 2X/week     Progress Toward Goals  OT Goals(current goals can now be found in the care plan section)  Progress towards OT goals: Progressing toward goals  Acute Rehab OT Goals Patient Stated Goal: to get better OT Goal Formulation: With patient/family Time For Goal Achievement:  10/27/13 Potential to Achieve Goals: Good ADL Goals Pt Will Perform Grooming: with set-up;with supervision;sitting Pt Will Perform Upper Body Bathing: with set-up;with supervision;sitting Pt Will Transfer to Toilet: with min assist;bedside commode Additional ADL Goal #1: Bed mobility with min A in preparation for ADL  Plan Discharge plan remains appropriate    Co-evaluation                 End of Session Equipment Utilized During Treatment: Gait belt;Back brace   Activity Tolerance Patient tolerated treatment well   Patient Left in chair;with call bell/phone within reach;with chair alarm set;with nursing/sitter in room   Nurse Communication Mobility status;Precautions      RN stopping OT to discuss patient transfers. Pt transferring to bed with daughter assistance s/p lunch with chair alarm sounding. Daughter finding OT for second time to discuss "who" can move patient and "who" will come back to help. Daughter educated that Therapist, sports / Garment/textile technologist will gladly assist and need to use call bell for faster  service. Daughter exiting room x3 to locate OT to address patient needs in the room and walking to RN station x2 to get faster more brisk response. Daughter educated that staff are unaware of the need to help without notification via call bell and we are happily / gladly willing to come to room upon call bell notification. Daughter expressed understanding. RN Vicente Males made aware of education about brace and need to have brace don over clothing with all RN staff/ tech staff for proper fit. Daughter grateful for OT education of brace and proper fit however very concerned that brace was on bare skin previously.   Time: 6440-3474 OT Time Calculation (min): 28 min  Charges: OT General Charges $OT Visit: 1 Procedure OT Treatments $Self Care/Home Management : 38-52 mins (see additional note for time added for discussion with RN) (additional 15 minutes )  Parke Poisson B 10/18/2013, 2:44  PM Pager: [phone]

## 2013-10-18 NOTE — Progress Notes (Signed)
UR COMPLETED  

## 2013-10-19 NOTE — Clinical Social Work Note (Signed)
Pt can transition to [facility].   Faith, MSW, [place]

## 2013-10-19 NOTE — Progress Notes (Signed)
Physical Therapy Treatment Patient Details Name: [name] MRN: [id number] DOB: 1932/06/23 Today's Date: 10/19/2013    History of Present Illness recent falls with C7 -T1 fracture. underwent Cervical five-Thoracic two cervico-thoracic posterior fusion 10/15/13 on return to [facility], pt reported vertigo lasting <15 seconds--likely BPPV after fall. PMHx-CAD, cerebrovascular disease, fibromyalgia, osteopenia, bipolar disorder, tumor removed lt eyelid    PT Comments    Pt reports she is hopeful for d/c to SNF today to get to the next step in her recovery. Pt continues to benefit from skilled PT intervention to address decreased strength, endurance, balance, functional mobility, and some cognitive impairments noted. Discussed safety recommendations with pt and husband in the future in regards to fall prevention.   Follow Up Recommendations  SNF;Supervision/Assistance - 24 hour     Equipment Recommendations  None recommended by PT    Recommendations for Other Services       Precautions / Restrictions Precautions Precautions: Fall;Back;Cervical Precaution Comments: precautions reviewed before mobilizing Required Braces or Orthoses: Cervical Brace (with thoracic component) Cervical Brace: Hard collar;At all times Restrictions Weight Bearing Restrictions: No    Mobility  Bed Mobility Overal bed mobility: Needs Assistance Bed Mobility: Rolling;Sidelying to Sit Rolling: Supervision Sidelying to sit: HOB elevated;Supervision Supine to sit: Supervision     General bed mobility comments: cues for technique to maintain back precautions as pt unable to recall these.   Transfers Overall transfer level: Needs assistance Equipment used: Rolling walker (2 wheeled) Transfers: Sit to/from Stand Sit to Stand: Min assist         General transfer comment: cues for hand placement and technique (not to pull up on RW) and to control descent into the chair by reaching back  first  Ambulation/Gait Ambulation/Gait assistance: Min guard Ambulation Distance (Feet): 3 Feet Assistive device: Rolling walker (2 wheeled) Gait Pattern/deviations: Step-to pattern     General Gait Details: Agreeable to take a few steps to the recliner nearby   Stairs            Wheelchair Mobility    Modified Rankin (Stroke Patients Only)       Balance Overall balance assessment: Needs assistance   Sitting balance-Leahy Scale: Fair     Standing balance support: Bilateral upper extremity supported Standing balance-Leahy Scale: Poor Standing balance comment: steady a for functional dynamic standing balance activity                    Cognition Arousal/Alertness: Awake/alert Behavior During Therapy: WFL for tasks assessed/performed Overall Cognitive Status: Impaired/Different from baseline Area of Impairment: Memory;Following commands     Memory: Decreased recall of precautions;Decreased short-term memory Following Commands: Follows multi-step commands with increased time       General Comments: Pt required increased time for commands and required clarification by PT for simple commands such as "stand up" after giving direction and cueing for hand placement    Exercises General Exercises - Lower Extremity Ankle Circles/Pumps: AROM;Both;15 reps Long Arc Quad: AROM;Strengthening;15 reps;Both Hip Flexion/Marching: AROM;Strengthening;Both;20 reps Toe Raises: AROM;Strengthening;Both;15 reps Other Exercises Other Exercises: sit to stands with focus on controlling descent x 5 reps with cues for hand placement    General Comments        Pertinent Vitals/Pain Pain Assessment: No/denies pain    Home Living                      Prior Function            PT  Goals (current goals can now be found in the care plan section) Acute Rehab PT Goals Patient Stated Goal: get to the next step soon PT Goal Formulation: With patient/family Time For  Goal Achievement: 10/27/13 Potential to Achieve Goals: Good Progress towards PT goals: Progressing toward goals    Frequency  Min 3X/week    PT Plan Current plan remains appropriate    Co-evaluation             End of Session Equipment Utilized During Treatment: Cervical collar Activity Tolerance: Patient tolerated treatment well Patient left: in chair;with call bell/phone within reach;with family/visitor present     Time: 5726-2035 PT Time Calculation (min): 23 min  Charges:  $Therapeutic Exercise: 8-22 mins $Therapeutic Activity: 8-22 mins                    G Codes:      Allayne Gitelman 10/19/2013, 12:11 PM

## 2013-10-19 NOTE — Clinical Social Work Note (Signed)
CSW was requested by [facility] to submit additional clinicals for the pt. CSW faxed clinicals to [facility]. Due to [facility] MUST system being down. [facility] can not process the clinicals submit by CSW. CSW will continue to check on [facility] system. The family and [place] is aware.   Ashley, MSW, [place]

## 2013-10-19 NOTE — Progress Notes (Addendum)
Pt daughter said her mother was put on the chair by the therapist and nobody come get the pt off from the chair. I teach the patient and the wife how to use the call bell to call when they need help. Due to the patient being forgetful I included the patient husband.

## 2013-10-19 NOTE — Discharge Summary (Signed)
Physician Discharge Summary  Patient ID: [name] MRN: [id number] DOB/AGE: May 12, 1932 80 y.o.  Admit date: 10/12/2013 Discharge date: 10/19/2013  Admission Diagnoses: C7-T1 fracture dislocation with perched facets    Discharge Diagnoses: C7-T1 fracture dislocation with perched facets s/p Cervical five-Thoracic two cervico-thoracic posterior fusion (N/A) with pedicle screws T 1 and T 2 and lateral mass screws C 5 and C 6 bilaterally with decompressive laminectomy of C 7 and posterolateral arthrodesis with local autograft and allograft   Active Problems:   C7 cervical fracture   Discharged Condition: good  Hospital Course: [name] was admitted through the ED after a fall, suffering unstable C7-T1 fracture.  Following uncomplicated I6-O0 posterior fusion, she transferred to [facility] for nursing care and therapies. She has progressed well and will require SNF for continued rehabilitation.  Consults: None  Significant Diagnostic Studies: radiology: X-Ray: Intra-operative  Treatments: surgery: Cervical five-Thoracic two cervico-thoracic posterior fusion (N/A) with pedicle screws T 1 and T 2 and lateral mass screws C 5 and C 6 bilaterally with decompressive laminectomy of C 7 and posterolateral arthrodesis with local autograft and allograft   Discharge Exam: Blood pressure 140/58, pulse 71, temperature 97.3 F (36.3 C), temperature source Oral, resp. rate 18, height 5\' 9"  (1.753 m), weight 77.7 kg (171 lb 4.8 oz), SpO2 96.00%. Alert, conversant. MAEW. Incision with Dermabond, no erythema, swelling, or drainage. DSD at drain site intact & dry. Hard collar with chest extension in use.   Disposition: Discharge to SNF.  Rx's to chart.  Office f/u in one month with DrStern.       Medication List         aspirin EC 81 MG tablet  Take 81 mg by mouth every evening.     atorvastatin 40 MG tablet  Commonly known as:  LIPITOR  Take 1 tablet (40 mg total) by mouth daily.     Biotin 5000  MCG Tabs  Take 5,000 mcg by mouth every morning.     Calcium-Magnesium 500-250 MG Tabs  Take 2 tablets by mouth daily at 6 PM.     carvedilol 25 MG tablet  Commonly known as:  COREG  Take 1 tablet (25 mg total) by mouth 2 (two) times daily with a meal.     cholestyramine 4 GM/DOSE powder  Commonly known as:  QUESTRAN  Take 4 g by mouth daily with breakfast. 2 scoops in water at 7 am     co-enzyme Q-10 50 MG capsule  Take 50 mg by mouth daily at 6 PM.     donepezil 5 MG tablet  Commonly known as:  ARICEPT  Take 10 mg by mouth daily. Take 2 tablets (10 mg) at 7 am     Evening Primrose Oil 500 MG Caps  Take 500 mg by mouth 2 (two) times daily.     fluticasone 50 MCG/ACT nasal spray  Commonly known as:  FLONASE  Place 2 sprays into both nostrils at bedtime.     guaifenesin 400 MG Tabs tablet  Commonly known as:  HUMIBID E  Take 400 mg by mouth 2 (two) times daily.     HYDROcodone-acetaminophen 5-325 MG per tablet  Commonly known as:  NORCO/VICODIN  Take 1 tablet by mouth every 4 (four) hours as needed for moderate pain.     levothyroxine 88 MCG tablet  Commonly known as:  SYNTHROID, LEVOTHROID  Take 1 tablet (88 mcg total) by mouth daily before breakfast.     lidocaine 5 %  Commonly  known as:  [place] 1 patch onto the skin every 12 (twelve) hours as needed (back pain). Remove & Discard patch within 12 hours or as directed by MD     meclizine 25 MG tablet  Commonly known as:  ANTIVERT  Take 25 mg by mouth 3 (three) times daily as needed (for vertigo / dizziness).     mesalamine 1.2 G EC tablet  Commonly known as:  LIALDA  Take 3.6 g by mouth daily with lunch.     Omega 3 1000 MG Caps  Take 1,000 mg by mouth 2 (two) times daily.     ondansetron 4 MG tablet  Commonly known as:  ZOFRAN  Take 1 tablet (4 mg total) by mouth every 8 (eight) hours as needed for nausea or vomiting.     PHAZYME PO  Take 1-2 tablets by mouth 2 (two) times daily. Take 1 tablet at  noon and 2 tablets at 4pm     pramipexole 0.5 MG tablet  Commonly known as:  MIRAPEX  Take 0.5 mg by mouth 2 (two) times daily.     Probiotic Caps  Take 1 capsule by mouth daily.     SUPER VITA-MINS PO  Take 1 tablet by mouth daily at 12 noon.     THERA TEARS ALLERGY 0.025 % ophthalmic solution  Generic drug:  ketotifen  Place 1 drop into both eyes daily as needed (dry eyes).     vitamin C 500 MG tablet  Commonly known as:  ASCORBIC ACID  Take 500 mg by mouth daily at 12 noon.     Vitamin D3 2000 UNITS Tabs  Take 2,000 Units by mouth daily at 12 noon.         Signed: Verdis Prime 10/19/2013, 8:08 AM

## 2013-10-19 NOTE — Progress Notes (Signed)
Subjective: Patient reports "The back of my neck stays sore, but I'm doing alright.  I'm going to the skilled nursing place today"  Objective: Vital signs in last 24 hours: Temp:  [97.3 F (36.3 C)-98.8 F (37.1 C)] 97.3 F (36.3 C) (10/13 0546) Pulse Rate:  [57-71] 71 (10/13 0546) Resp:  [18] 18 (10/13 0546) BP: (106-155)/(44-61) 140/58 mmHg (10/13 0546) SpO2:  [94 %-96 %] 96 % (10/13 0546) Weight:  [77.7 kg (171 lb 4.8 oz)] 77.7 kg (171 lb 4.8 oz) (10/13 0546)  Intake/Output from previous day: 10/12 0701 - 10/13 0700 In: 360 [P.O.:360] Out: -  Intake/Output this shift:    Alert, conversant. MAEW. Incision with Dermabond, no erythema, swelling, or drainage. DSD at drain site intact & dry. Hard collar with chest extension in use.  Lab Results: No results found for this basename: WBC, HGB, HCT, PLT,  in the last 72 hours BMET No results found for this basename: NA, K, CL, CO2, GLUCOSE, BUN, CREATININE, CALCIUM,  in the last 72 hours  Studies/Results: No results found.  Assessment/Plan: Improving    LOS: 7 days  Discharge to SNF per DrStern. Rx's to chart.  Office f/u in one month with DrStern.    Verdis Prime 10/19/2013, 8:01 AM

## 2013-10-20 ENCOUNTER — Ambulatory Visit: Payer: Medicare Other | Admitting: Family Medicine

## 2013-10-20 DIAGNOSIS — Z9889 Other specified postprocedural states: Secondary | ICD-10-CM | POA: Diagnosis not present

## 2013-10-20 DIAGNOSIS — S1980XA Other specified injuries of unspecified part of neck, initial encounter: Secondary | ICD-10-CM | POA: Diagnosis not present

## 2013-10-20 DIAGNOSIS — M6281 Muscle weakness (generalized): Secondary | ICD-10-CM | POA: Diagnosis not present

## 2013-10-20 DIAGNOSIS — I2510 Atherosclerotic heart disease of native coronary artery without angina pectoris: Secondary | ICD-10-CM | POA: Diagnosis not present

## 2013-10-20 DIAGNOSIS — I10 Essential (primary) hypertension: Secondary | ICD-10-CM | POA: Diagnosis not present

## 2013-10-20 DIAGNOSIS — G4733 Obstructive sleep apnea (adult) (pediatric): Secondary | ICD-10-CM | POA: Diagnosis not present

## 2013-10-20 DIAGNOSIS — S12600S Unspecified displaced fracture of seventh cervical vertebra, sequela: Secondary | ICD-10-CM | POA: Diagnosis not present

## 2013-10-20 DIAGNOSIS — E785 Hyperlipidemia, unspecified: Secondary | ICD-10-CM | POA: Diagnosis not present

## 2013-10-20 DIAGNOSIS — S12600D Unspecified displaced fracture of seventh cervical vertebra, subsequent encounter for fracture with routine healing: Secondary | ICD-10-CM | POA: Diagnosis not present

## 2013-10-20 DIAGNOSIS — C4A9 Merkel cell carcinoma, unspecified: Secondary | ICD-10-CM | POA: Diagnosis not present

## 2013-10-20 DIAGNOSIS — Z9181 History of falling: Secondary | ICD-10-CM | POA: Diagnosis not present

## 2013-10-20 DIAGNOSIS — E039 Hypothyroidism, unspecified: Secondary | ICD-10-CM | POA: Diagnosis not present

## 2013-10-20 DIAGNOSIS — R2681 Unsteadiness on feet: Secondary | ICD-10-CM | POA: Diagnosis not present

## 2013-10-20 DIAGNOSIS — F319 Bipolar disorder, unspecified: Secondary | ICD-10-CM | POA: Diagnosis not present

## 2013-10-20 DIAGNOSIS — F0390 Unspecified dementia without behavioral disturbance: Secondary | ICD-10-CM | POA: Diagnosis not present

## 2013-10-20 DIAGNOSIS — S12690G Other displaced fracture of seventh cervical vertebra, subsequent encounter for fracture with delayed healing: Secondary | ICD-10-CM | POA: Diagnosis not present

## 2013-10-20 DIAGNOSIS — G2581 Restless legs syndrome: Secondary | ICD-10-CM | POA: Diagnosis not present

## 2013-10-20 DIAGNOSIS — M542 Cervicalgia: Secondary | ICD-10-CM | POA: Diagnosis not present

## 2013-10-20 DIAGNOSIS — G4700 Insomnia, unspecified: Secondary | ICD-10-CM | POA: Diagnosis not present

## 2013-10-20 DIAGNOSIS — S22018D Other fracture of first thoracic vertebra, subsequent encounter for fracture with routine healing: Secondary | ICD-10-CM | POA: Diagnosis not present

## 2013-10-20 DIAGNOSIS — M79641 Pain in right hand: Secondary | ICD-10-CM | POA: Diagnosis not present

## 2013-10-20 NOTE — Progress Notes (Signed)
Subjective: Patient reports "I think we are waiting for transportation"  Objective: Vital signs in last 24 hours: Temp:  [97.9 F (36.6 C)-98.8 F (37.1 C)] 97.9 F (36.6 C) (10/14 0211) Pulse Rate:  [59-71] 68 (10/14 0211) Resp:  [18-20] 20 (10/14 0211) BP: (116-172)/(38-96) 172/58 mmHg (10/14 0211) SpO2:  [96 %-99 %] 98 % (10/14 0211) Weight:  [76 kg (167 lb 8.8 oz)] 76 kg (167 lb 8.8 oz) (10/14 0211)  Intake/Output from previous day: 10/13 0701 - 10/14 0700 In: 240 [P.O.:240] Out: -  Intake/Output this shift:    Without change. No problems. Awaits transport to [facility].  Lab Results: No results found for this basename: WBC, HGB, HCT, PLT,  in the last 72 hours BMET No results found for this basename: NA, K, CL, CO2, GLUCOSE, BUN, CREATININE, CALCIUM,  in the last 72 hours  Studies/Results: No results found.  Assessment/Plan: Improved    LOS: 8 days  D/C to [facility] when transportation available.    Verdis Prime 10/20/2013, 7:30 AM

## 2013-10-20 NOTE — Progress Notes (Signed)
LOS beyond my control.  Patient awaiting transportation to SNF

## 2013-10-20 NOTE — Progress Notes (Signed)
Report given to Caren Griffins from [facility]. All belongs sent with spouse. No other distress noted. Awaiting for transport.  Ave Filter, RN

## 2013-10-20 NOTE — Progress Notes (Signed)
Doing well.  Awaiting SNF placement

## 2013-10-21 ENCOUNTER — Non-Acute Institutional Stay (SKILLED_NURSING_FACILITY): Payer: Medicare Other | Admitting: Internal Medicine

## 2013-10-21 DIAGNOSIS — E039 Hypothyroidism, unspecified: Secondary | ICD-10-CM

## 2013-10-21 DIAGNOSIS — I2510 Atherosclerotic heart disease of native coronary artery without angina pectoris: Secondary | ICD-10-CM

## 2013-10-21 DIAGNOSIS — S12600S Unspecified displaced fracture of seventh cervical vertebra, sequela: Secondary | ICD-10-CM | POA: Diagnosis not present

## 2013-10-21 DIAGNOSIS — F0390 Unspecified dementia without behavioral disturbance: Secondary | ICD-10-CM

## 2013-10-22 NOTE — Progress Notes (Signed)
HISTORY & PHYSICAL  DATE: 10/21/2013   FACILITY: [facility]  LEVEL OF CARE: SNF (31)  ALLERGIES:  Allergies  Allergen Reactions  . Prednisone Other (See Comments)    Patient had psychiatric episode with hallucinations. Caused 3.5 week long hospitalization & loss of memory   . Carbamazepine Nausea Only and Other (See Comments)    Tegretol; dizziness, blurred vision, nausea, headache, insomnia  . Celebrex [Celecoxib] Other (See Comments)    No relief from pain; ineffective  . Codeine Nausea And Vomiting  . Demerol [Meperidine] Nausea And Vomiting  . Diclofenac Sodium Other (See Comments)    severe headache  . Fentanyl Nausea And Vomiting and Other (See Comments)    IV Fentanyl; caused nausea and vomiting Fentanyl patch; caused chest pain, HBP, with second patch experienced vertigo and nausea  . Fluoxetine Hcl Nausea Only and Other (See Comments)    dizziness, blurred vision, nausea, headache, insomnia  . Hydromorphone Hcl Nausea And Vomiting  . Ibuprofen Other (See Comments)    Caused ulcers and colitis  . Indomethacin Nausea Only and Other (See Comments)    dizziness, blurred vision, nausea, headache, insomnia  . Lisinopril Cough  . Meperidine Hcl Nausea And Vomiting  . Morphine Nausea And Vomiting  . Oruvail [Ketoprofen] Other (See Comments)    No relief from pain; ineffective  . Pregabalin Other (See Comments)    severe restless legs, insomnia  . Propranolol Hcl Nausea Only and Other (See Comments)    dizziness, blurred vision, nausea, headache, insomnia  . Refresh Lacri-Lube [Artificial Tears] Swelling    Caused swelling, redness, hard crust on eyelids   . Relafen [Nabumetone] Other (See Comments)    No relief from pain; ineffective  . Ropinirole Hcl Other (See Comments)    severe restless legs and insomnia  . Sulfamethoxazole Nausea Only and Other (See Comments)    gas, loose stools  . Bacitracin Hives, Itching and Rash    redness      CHIEF COMPLAINT:  Manage C7 fracture, CAD and dementia  HISTORY OF PRESENT ILLNESS: Patient is a 80 year old Caucasian female.  C7 FRACTURE: Patient had a fall, suffering unstable C7-T1 fracture. She had C5-T2 posterior fusion and tolerated the procedure well. She is admitted to this facility for short-term rehabilitation. She is currently wearing a cervical collar. She denies neck pain. She is tolerating the medications without any side effects.  CAD: The angina has been stable. The patient denies dyspnea on exertion, orthopnea, pedal edema, palpitations and paroxysmal nocturnal dyspnea. No complications noted from the medication presently being used.  DEMENTIA: The dementia remaines stable and continues to function adequately in the current living environment with supervision.  The patient has had little changes in behavior. No complications noted from the medications presently being used.  PAST MEDICAL HISTORY :  Past Medical History  Diagnosis Date  . OSA (obstructive sleep apnea)   . History of bronchitis   . Other diseases of lung, not elsewhere classified   . Hypertension   . CAD (coronary artery disease)     LAD 30% 2006  . History of atrial flutter   . Cerebrovascular disease   . Hyperlipemia   . Obesity   . GERD (gastroesophageal reflux disease)   . Diverticulosis of colon   . IBS (irritable bowel syndrome)   . Colonic polyp   . Crohn's disease   . Hypothyroidism   . History of hyperparathyroidism   . Fibromyalgia   .  Osteopenia   . History of headache   . Syncope   . Restless legs syndrome (RLS)   . Psychosis   . History of tuberculosis   . Radiation 05/2008    5000 cGy to left lower eyelid, parotid lymphatics and upper neck  . Memory disturbance   . Bipolar affective     History of psychosis  . Merkel cell tumor     Left eyelid    PAST SURGICAL HISTORY: Past Surgical History  Procedure Laterality Date  . Parathyroid adenoma surgery    .  Cholecystectomy    . Moh';s surg left lower eye lid surgery and lid reconstruction surgery  2/10  . Bladder suspension    . Cataract extraction Bilateral   . Appendectomy    . Tonsillectomy and adenoidectomy    . Lumbar laminectomy    . Incision and drainage perirectal abscess    . Heel spur excision    . Nasal sinus surgery Right     Right maxillary  . Eye lid surgery  04/04/2011    [facility] by Dr. Norlene Duel  . Posterior cervical fusion/foraminotomy N/A 10/12/2013    Procedure: Cervical five-Thoracic two cervico-thoracic posterior fusion;  Surgeon: [name], MD;  Location: [place] [facility];  Service: Neurosurgery;  Laterality: N/A;    SOCIAL HISTORY:  reports that she quit smoking about 53 years ago. Her smoking use included Cigarettes. She smoked 0.00 packs per day for 6 years. She has never used smokeless tobacco. She reports that she does not drink alcohol or use illicit drugs.  FAMILY HISTORY:  Family History  Problem Relation Age of Onset  . Colon cancer      grandmother  . Arthritis Mother   . Hypertension Mother   . Ulcers Mother   . Heart failure Father   . Pulmonary embolism      CURRENT MEDICATIONS: Reviewed per MAR/see medication list  REVIEW OF SYSTEMS:  See HPI otherwise 14 point ROS is negative.  PHYSICAL EXAMINATION  VS:  See VS section  GENERAL: no acute distress, moderately obese body habitus EYES: conjunctivae normal, sclerae normal, normal eye lids MOUTH/THROAT: lips without lesions,no lesions in the mouth,tongue is without lesions,uvula elevates in midline NECK: supple, trachea midline, no neck masses, no thyroid tenderness, no thyromegaly LYMPHATICS: no LAN in the neck, no supraclavicular LAN RESPIRATORY: breathing is even & unlabored, BS CTAB CARDIAC: RRR, no murmur,no extra heart sounds, no edema GI:  ABDOMEN: abdomen soft, normal BS, no masses, no tenderness  LIVER/SPLEEN: no hepatomegaly, no splenomegaly MUSCULOSKELETAL: HEAD: normal to  inspection  EXTREMITIES: LEFT UPPER EXTREMITY: full range of motion, normal strength & tone RIGHT UPPER EXTREMITY:  full range of motion, decreased strength & tone LEFT LOWER EXTREMITY:  full range of motion, normal strength & tone RIGHT LOWER EXTREMITY:  full range of motion, normal strength & tone PSYCHIATRIC: the patient is alert & oriented to person, affect & behavior appropriate  LABS/RADIOLOGY:  Labs reviewed: Basic Metabolic Panel:  Recent Labs  04/01/13 1051 10/07/13 1550 10/12/13 1024  NA 139 143 137  K 4.5 4.2 4.0  CL 103 105 98  CO2 30 22 26   GLUCOSE 97 123* 110*  BUN 17 15 13   CREATININE 0.7 0.64 0.63  CALCIUM 9.5 9.6 9.4   Liver Function Tests:  Recent Labs  04/01/13 1051 10/07/13 1550  AST 28 113*  ALT 22 62*  ALKPHOS 51 64  BILITOT 0.6 1.0  PROT 7.3 7.4  ALBUMIN 4.0 3.9  CBC:  Recent Labs  04/01/13 1051 10/07/13 1550 10/12/13 1024  WBC 6.0 8.5 7.5  NEUTROABS 2.5 6.7 5.0  HGB 12.5 13.3 13.0  HCT 37.5 38.2 38.6  MCV 95.3 93.4 93.5  PLT 145.0* 127* 177   Lipid Panel:  Recent Labs  04/01/13 1051  HDL 82.50   Cardiac Enzymes:  Recent Labs  10/12/13 1024  TROPONINI <0.30    THORACIC SPINE - 2 VIEW   COMPARISON:  PA and lateral chest x-ray of today's date and of December 16, 2012   FINDINGS: There is gentle mid thoracic curvature convex toward the right new since December 2014 that is likely positional. There is mild loss of height of the body of T5. This amounts to approximately 25% anteriorly and posteriorly. No retropulsion of bone is demonstrated. There are no abnormal paravertebral soft tissue densities.   IMPRESSION: There is partial compression of the body of T5 new since December 2014. Otherwise no acute lumbar spine abnormality is demonstrated.   THORACIC SPINE - 2 VIEW   COMPARISON:  PA and lateral chest x-ray of today's date and of December 16, 2012   FINDINGS: There is gentle mid thoracic curvature convex  toward the right new since December 2014 that is likely positional. There is mild loss of height of the body of T5. This amounts to approximately 25% anteriorly and posteriorly. No retropulsion of bone is demonstrated. There are no abnormal paravertebral soft tissue densities.   IMPRESSION: There is partial compression of the body of T5 new since December 2014. Otherwise no acute lumbar spine abnormality is demonstrated.   THORACIC SPINE - 2 VIEW   COMPARISON:  PA and lateral chest x-ray of today's date and of December 16, 2012   FINDINGS: There is gentle mid thoracic curvature convex toward the right new since December 2014 that is likely positional. There is mild loss of height of the body of T5. This amounts to approximately 25% anteriorly and posteriorly. No retropulsion of bone is demonstrated. There are no abnormal paravertebral soft tissue densities.   IMPRESSION: There is partial compression of the body of T5 new since December 2014. Otherwise no acute lumbar spine abnormality is demonstrated CERVICAL SPINE  4+ VIEWS   COMPARISON:  Thoracic spine series of today's date.   FINDINGS: The cervical vertebral bodies are preserved in height. There is mild disc space narrowing at C5-6 and at C6-7. There is facet joint hypertrophy and uncovertebral joint hypertrophy at multiple levels. The odontoid is intact.   IMPRESSION: There is no acute cervical spine fracture or dislocation. There is moderate degenerative disc and facet joint change at multiple levels.   ADDENDUM: At the C7 level, there are bone fragments encroaching upon the central canal from the right lamina fracture.     Electronically Signed   By: Maryclare Bean M.D.   On: 10/12/2013 13:50       Study Result    CLINICAL DATA:  Fall 2 days ago.  Thoracic compression fracture.   EXAM: CT HEAD WITHOUT CONTRAST   CT MAXILLOFACIAL WITHOUT CONTRAST   CT CERVICAL SPINE WITHOUT CONTRAST    TECHNIQUE: Multidetector CT imaging of the head, cervical spine, and maxillofacial structures were performed using the standard protocol without intravenous contrast. Multiplanar CT image reconstructions of the cervical spine and maxillofacial structures were also generated.   COMPARISON:  02/13/2012.  10/19/2007.   FINDINGS: CT HEAD FINDINGS   Mild chronic ischemic changes in the periventricular white matter. Mild global atrophy appropriate to  age. No mass effect, midline shift, or acute intracranial hemorrhage. Postoperative changes in the right maxillary sinus with chronic mucosal thickening. Postoperative changes in the ethmoid air cells. Mastoid air cells clear. No skull fractures.   CT MAXILLOFACIAL FINDINGS   No acute fracture. No dislocation. Postoperative and chronic changes in the paranasal sinuses. Zygomatic arches are intact. Globes are intact. No orbital or vitreous hemorrhage.   CT CERVICAL SPINE FINDINGS   There is a severe injury at C7-T1. This involves fracture of both C7 lamina, the see set spinous process, and the right T1 transverse process. There is also distraction of the left side of the C7-T1 disc and distraction of the left C7-T1 facet joint. There is slight distraction of the right C7-T1 facet joint associated with kyphosis.   A superior endplate compression fracture of T1 is associated.   There is buckling of the right lamina of C6 compatible with buckle fracture. There is also a nondisplaced fracture of the left C6 lamina adjacent to the spinous process.   No obvious spinal hemorrhage based on CT.   Scattered degenerative changes throughout the cervical spine from C1- C5 are noted. No other fractures are identified above C6.   There is a 2.3 x 2.3 cm cystic area at the medial right apex.   IMPRESSION: Ustable injury at C7 and T1 involving posterior element fractures and ligamentous injury with distraction of the posterior elements and  kyphosis.   T1 compression fracture.   Bilateral C6 lamina fractures.   2.3 cm cystic area at the medial left lung apex which may represent a dramatic meningocele. It is nonspecific.   Critical Value/emergent results were called by telephone at the time of interpretation on 10/12/2013 at 10:17 am to Dr. Joseph Berkshire , who verbally acknowledged these results.     LUMBAR SPINE - COMPLETE 4+ VIEW   COMPARISON:  Bone scan 02/10/2011.  Lumbar spine series 2 /06/2011.   FINDINGS: Lumbar are numbered with the lowest appearing segmented lumbar vertebrae on lateral view as L5. No acute bony abnormality identified. Normal alignment. Diffuse degenerative change, particularly L3-4, L4-5, L5-S1. Mild scoliosis concave right again noted. Aortoiliac atherosclerotic vascular disease. Surgical clips right upper quadrant and pelvis.   IMPRESSION: 1. Stable lumbar spine degenerative change and scoliosis. 2. No acute abnormality. THORACIC SPINE - 3 VIEW   COMPARISON:  October 07, 2013   FINDINGS: Frontal, lateral, and swimmer's views were obtained. Anterior wedging of the T5 vertebral body remains stable. There is no demonstrable new fracture. There is no spondylolisthesis. There is mild disc space narrowing at multiple levels, stable. No erosive change. There is slight upper thoracic levoscoliosis.   IMPRESSION: Stable anterior wedging of the T5 vertebral body. No new fracture. No spondylolisthesis. Relatively mild osteoarthritic change at multiple levels. There is slight upper thoracic levoscoliosis CHEST - 1 VIEW   COMPARISON:  October 07, 2013   FINDINGS: There is no edema or consolidation. Heart size and pulmonary vascularity are normal. No adenopathy. No pneumothorax. There is postoperative change in each shoulder. The compression fracture at T5 is not well seen on this frontal only view.   IMPRESSION: No edema or consolidation.  No apparent pneumothorax.     OPERATIVE  CERVICAL SPINE 1 VIEW   COMPARISON:  None.   FINDINGS: Single spot image from the C-arm fluoroscopic device, AP view of the cervical spine, is submitted for interpretation postoperatively. This image demonstrates bilateral pedicle screws at C6, C7, T1 and T2.   IMPRESSION: Image obtained  during C6 through T2 fusion demonstrating bilateral pedicle screws at C6, C7, T1 and T2. OPERATIVE CERVICAL SPINE 1 VIEW   COMPARISON:  None.   FINDINGS: Single spot image from the C-arm fluoroscopic device, AP view of the cervical spine, is submitted for interpretation postoperatively. This image demonstrates bilateral pedicle screws at C6, C7, T1 and T2.   IMPRESSION: Image obtained during C6 through T2 fusion demonstrating bilateral pedicle screws at C6, C7, T1 and T2.    ASSESSMENT/PLAN:  C7 fracture-status post surgery. Continue cervical collar and rehabilitation. CAD-stable  dementia- continue Aricept Hypothyroidism-continue levothyroxine Allergic rhinitis-well-controlled hyperlipidemia-continue Lipitor  I have reviewed patient's medical records received at admission/from hospitalization.  CPT CODE: 97588  Osten Janek Y Kanasia Gayman, [place] [phone]

## 2013-10-25 DIAGNOSIS — M79641 Pain in right hand: Secondary | ICD-10-CM | POA: Diagnosis not present

## 2013-11-04 ENCOUNTER — Non-Acute Institutional Stay (SKILLED_NURSING_FACILITY): Payer: Medicare Other | Admitting: Adult Health

## 2013-11-04 ENCOUNTER — Encounter: Payer: Self-pay | Admitting: Adult Health

## 2013-11-04 DIAGNOSIS — F0390 Unspecified dementia without behavioral disturbance: Secondary | ICD-10-CM | POA: Diagnosis not present

## 2013-11-04 DIAGNOSIS — J309 Allergic rhinitis, unspecified: Secondary | ICD-10-CM | POA: Insufficient documentation

## 2013-11-04 DIAGNOSIS — E039 Hypothyroidism, unspecified: Secondary | ICD-10-CM

## 2013-11-04 DIAGNOSIS — S12600S Unspecified displaced fracture of seventh cervical vertebra, sequela: Secondary | ICD-10-CM

## 2013-11-04 DIAGNOSIS — G4700 Insomnia, unspecified: Secondary | ICD-10-CM

## 2013-11-04 DIAGNOSIS — I10 Essential (primary) hypertension: Secondary | ICD-10-CM | POA: Diagnosis not present

## 2013-11-04 DIAGNOSIS — K50919 Crohn's disease, unspecified, with unspecified complications: Secondary | ICD-10-CM

## 2013-11-04 DIAGNOSIS — G2581 Restless legs syndrome: Secondary | ICD-10-CM

## 2013-11-04 DIAGNOSIS — E785 Hyperlipidemia, unspecified: Secondary | ICD-10-CM

## 2013-11-04 NOTE — Progress Notes (Signed)
Patient ID: [name], female   DOB: 12-31-1932, 80 y.o.   MRN: [id number]              PROGRESS NOTE  DATE: 11/04/2013   FACILITY: [facility]  LEVEL OF CARE: SNF (31)   Discharge Notes  HISTORY OF PRESENT ILLNESS:   This is a 80 year old female who is for discharge home on 11/16/13 with Home health PT, OT, Nursing and CNA. DME: Bedside Commode and standard rolling walker. She has been admitted to [facility] on 10/20/13 from [facility] with C7 - T1 fracture S/P C5 - T2 posterior fusion.  Patient was admitted to this facility for short-term rehabilitation after the patient's recent hospitalization.  Patient has completed SNF rehabilitation and therapy has cleared the patient for discharge.  Reassessment of ongoing problem(s):  HTN: Pt 's HTN remains stable.  Denies CP, sob, DOE, pedal edema, headaches, dizziness or visual disturbances.  No complications from the medications currently being used.  Last BP : 129/58  HYPOTHYROIDISM: The hypothyroidism remains stable. No complications noted from the medications presently being used.  The patient denies fatigue or constipation.  6/15 TSH 1.25  ALLERGIC RHINITIS: Allergic rhinitis remains stable.  Patient denies ongoing symptoms such as runny nose sneezing or tearing. No complications reported from the current medication(s) being used.   Past Medical History  Diagnosis Date  . OSA (obstructive sleep apnea)   . History of bronchitis   . Other diseases of lung, not elsewhere classified   . Hypertension   . CAD (coronary artery disease)     LAD 30% 2006  . History of atrial flutter   . Cerebrovascular disease   . Hyperlipemia   . Obesity   . GERD (gastroesophageal reflux disease)   . Diverticulosis of colon   . IBS (irritable bowel syndrome)   . Colonic polyp   . Crohn's disease   . Hypothyroidism   . History of hyperparathyroidism   . Fibromyalgia   . Osteopenia   . History of headache   . Syncope     . Restless legs syndrome (RLS)   . Psychosis   . History of tuberculosis   . Radiation 05/2008    5000 cGy to left lower eyelid, parotid lymphatics and upper neck  . Memory disturbance   . Bipolar affective     History of psychosis  . Merkel cell tumor     Left eyelid     Reviewed.  No changes/see problem list  CURRENT MEDICATIONS: Reviewed per MAR/see medication list    Allergies  Allergen Reactions  . Prednisone Other (See Comments)    Patient had psychiatric episode with hallucinations. Caused 3.5 week long hospitalization & loss of memory   . Carbamazepine Nausea Only and Other (See Comments)    Tegretol; dizziness, blurred vision, nausea, headache, insomnia  . Celebrex [Celecoxib] Other (See Comments)    No relief from pain; ineffective  . Codeine Nausea And Vomiting  . Demerol [Meperidine] Nausea And Vomiting  . Diclofenac Sodium Other (See Comments)    severe headache  . Fentanyl Nausea And Vomiting and Other (See Comments)    IV Fentanyl; caused nausea and vomiting Fentanyl patch; caused chest pain, HBP, with second patch experienced vertigo and nausea  . Fluoxetine Hcl Nausea Only and Other (See Comments)    dizziness, blurred vision, nausea, headache, insomnia  . Hydromorphone Hcl Nausea And Vomiting  . Ibuprofen Other (See Comments)    Caused ulcers and  colitis  . Indomethacin Nausea Only and Other (See Comments)    dizziness, blurred vision, nausea, headache, insomnia  . Lisinopril Cough  . Meperidine Hcl Nausea And Vomiting  . Morphine Nausea And Vomiting  . Oruvail [Ketoprofen] Other (See Comments)    No relief from pain; ineffective  . Pregabalin Other (See Comments)    severe restless legs, insomnia  . Propranolol Hcl Nausea Only and Other (See Comments)    dizziness, blurred vision, nausea, headache, insomnia  . Refresh Lacri-Lube [Artificial Tears] Swelling    Caused swelling, redness, hard crust on eyelids   . Relafen [Nabumetone] Other (See  Comments)    No relief from pain; ineffective  . Ropinirole Hcl Other (See Comments)    severe restless legs and insomnia  . Sulfamethoxazole Nausea Only and Other (See Comments)    gas, loose stools  . Bacitracin Hives, Itching and Rash    redness     REVIEW OF SYSTEMS:  GENERAL: no change in appetite, no fatigue, no weight changes, no fever, chills or weakness RESPIRATORY: no cough, SOB, DOE, wheezing, hemoptysis CARDIAC: no chest pain, edema or palpitations GI: no abdominal pain, diarrhea, constipation, heart burn, nausea or vomiting  PHYSICAL EXAMINATION  GENERAL: no acute distress, normal body habitus EYES: conjunctivae normal, sclerae normal, normal eye lids NECK: supple, trachea midline, no neck masses, no thyroid tenderness, no thyromegaly, +cervical collar LYMPHATICS: no LAN in the neck, no supraclavicular LAN RESPIRATORY: breathing is even & unlabored, BS CTAB CARDIAC: RRR, no murmur,no extra heart sounds, no edema GI: abdomen soft, normal BS, no masses, no tenderness, no hepatomegaly, no splenomegaly EXTREMITIES: PSYCHIATRIC: the patient is alert & oriented to person, affect & behavior appropriate  LABS/RADIOLOGY: Labs reviewed: Basic Metabolic Panel:  Recent Labs  04/01/13 1051 10/07/13 1550 10/12/13 1024  NA 139 143 137  K 4.5 4.2 4.0  CL 103 105 98  CO2 30 22 26   GLUCOSE 97 123* 110*  BUN 17 15 13   CREATININE 0.7 0.64 0.63  CALCIUM 9.5 9.6 9.4   Liver Function Tests:  Recent Labs  04/01/13 1051 10/07/13 1550  AST 28 113*  ALT 22 62*  ALKPHOS 51 64  BILITOT 0.6 1.0  PROT 7.3 7.4  ALBUMIN 4.0 3.9   CBC:  Recent Labs  04/01/13 1051 10/07/13 1550 10/12/13 1024  WBC 6.0 8.5 7.5  NEUTROABS 2.5 6.7 5.0  HGB 12.5 13.3 13.0  HCT 37.5 38.2 38.6  MCV 95.3 93.4 93.5  PLT 145.0* 127* 177   Lipid Panel:  Recent Labs  04/01/13 1051  HDL 82.50   Cardiac Enzymes:  Recent Labs  10/12/13 1024  TROPONINI <0.30   Dg Chest 1  View  10/12/2013   CLINICAL DATA:  Persistent pain after falling 2 weeks prior; hypertension  EXAM: CHEST - 1 VIEW  COMPARISON:  October 07, 2013  FINDINGS: There is no edema or consolidation. Heart size and pulmonary vascularity are normal. No adenopathy. No pneumothorax. There is postoperative change in each shoulder. The compression fracture at T5 is not well seen on this frontal only view.  IMPRESSION: No edema or consolidation.  No apparent pneumothorax.   Electronically Signed   By: Lowella Grip M.D.   On: 10/12/2013 10:24   Dg Chest 2 View  10/07/2013   CLINICAL DATA:  Shortness of breath, loss of consciousness, and a fall today. Now with knee and to a upper back and lower neck pain ; history of hypertension, tuberculosis, coronary artery disease, and bronchitis ;  remote history of smoking  EXAM: CHEST  2 VIEW  COMPARISON:  PA and lateral chest x-ray of December 16, 2012 ; thoracic spine series of today's date  FINDINGS: The lungs are well-expanded. There is no focal infiltrate. There is no pleural effusion. The cardiac silhouette is top-normal in size. The pulmonary vascularity is not engorged. The mediastinum is normal in width. There is mild loss of height of the body of T5 which is new since the study of December 16, 2012. The loss of height is approximately 25% anteriorly and posteriorly. The observed portions of the ribs are normal.  IMPRESSION: There is no acute cardiopulmonary abnormality. There is partial compression of the body of T5 which has appeared since the December 16, 2012 study.   Electronically Signed   By: David  Martinique   On: 10/07/2013 16:52   Dg Cervical Spine 1 View  10/12/2013   CLINICAL DATA:  C7 fracture.  Posterior fusion from C5 through T2.  EXAM: OPERATIVE CERVICAL SPINE 1 VIEW  COMPARISON:  None.  FINDINGS: Single spot image from the C-arm fluoroscopic device, AP view of the cervical spine, is submitted for interpretation postoperatively. This image demonstrates  bilateral pedicle screws at C6, C7, T1 and T2.  IMPRESSION: Image obtained during C6 through T2 fusion demonstrating bilateral pedicle screws at C6, C7, T1 and T2.   Electronically Signed   By: Evangeline Dakin M.D.   On: 10/12/2013 20:12   Dg Cervical Spine Complete  10/07/2013   CLINICAL DATA:  Status post fall. Following syncopal episode. Complaining of lower neck pain  EXAM: CERVICAL SPINE  4+ VIEWS  COMPARISON:  Thoracic spine series of today's date.  FINDINGS: The cervical vertebral bodies are preserved in height. There is mild disc space narrowing at C5-6 and at C6-7. There is facet joint hypertrophy and uncovertebral joint hypertrophy at multiple levels. The odontoid is intact.  IMPRESSION: There is no acute cervical spine fracture or dislocation. There is moderate degenerative disc and facet joint change at multiple levels.   Electronically Signed   By: David  Martinique   On: 10/07/2013 16:56   Dg Thoracic Spine 2 View  10/12/2013   CLINICAL DATA:  Pain for 2 weeks following trauma  EXAM: THORACIC SPINE - 3 VIEW  COMPARISON:  October 07, 2013  FINDINGS: Frontal, lateral, and swimmer's views were obtained. Anterior wedging of the T5 vertebral body remains stable. There is no demonstrable new fracture. There is no spondylolisthesis. There is mild disc space narrowing at multiple levels, stable. No erosive change. There is slight upper thoracic levoscoliosis.  IMPRESSION: Stable anterior wedging of the T5 vertebral body. No new fracture. No spondylolisthesis. Relatively mild osteoarthritic change at multiple levels. There is slight upper thoracic levoscoliosis.   Electronically Signed   By: Lowella Grip M.D.   On: 10/12/2013 10:26   Dg Thoracic Spine 2 View  10/07/2013   CLINICAL DATA:  Loss of consciousness and subsequent fall today. Now with mid and upper back and lower neck pain  EXAM: THORACIC SPINE - 2 VIEW  COMPARISON:  PA and lateral chest x-ray of today's date and of December 16, 2012   FINDINGS: There is gentle mid thoracic curvature convex toward the right new since December 2014 that is likely positional. There is mild loss of height of the body of T5. This amounts to approximately 25% anteriorly and posteriorly. No retropulsion of bone is demonstrated. There are no abnormal paravertebral soft tissue densities.  IMPRESSION: There is partial compression  of the body of T5 new since December 2014. Otherwise no acute lumbar spine abnormality is demonstrated.   Electronically Signed   By: David  Martinique   On: 10/07/2013 16:53   Dg Lumbar Spine Complete  10/12/2013   CLINICAL DATA:  [place] 2 weeks ago. Pain. Limited range motion. Initial evaluation  EXAM: LUMBAR SPINE - COMPLETE 4+ VIEW  COMPARISON:  Bone scan 02/10/2011.  Lumbar spine series 2 /06/2011.  FINDINGS: Lumbar are numbered with the lowest appearing segmented lumbar vertebrae on lateral view as L5. No acute bony abnormality identified. Normal alignment. Diffuse degenerative change, particularly L3-4, L4-5, L5-S1. Mild scoliosis concave right again noted. Aortoiliac atherosclerotic vascular disease. Surgical clips right upper quadrant and pelvis.  IMPRESSION: 1. Stable lumbar spine degenerative change and scoliosis. 2. No acute abnormality.   Electronically Signed   By: Marcello Moores  Register   On: 10/12/2013 10:28   Ct Head Wo Contrast  10/12/2013   ADDENDUM REPORT: 10/12/2013 13:50  ADDENDUM: At the C7 level, there are bone fragments encroaching upon the central canal from the right lamina fracture.   Electronically Signed   By: Maryclare Bean M.D.   On: 10/12/2013 13:50   10/12/2013   CLINICAL DATA:  Fall 2 days ago.  Thoracic compression fracture.  EXAM: CT HEAD WITHOUT CONTRAST  CT MAXILLOFACIAL WITHOUT CONTRAST  CT CERVICAL SPINE WITHOUT CONTRAST  TECHNIQUE: Multidetector CT imaging of the head, cervical spine, and maxillofacial structures were performed using the standard protocol without intravenous contrast. Multiplanar CT image  reconstructions of the cervical spine and maxillofacial structures were also generated.  COMPARISON:  02/13/2012.  10/19/2007.  FINDINGS: CT HEAD FINDINGS  Mild chronic ischemic changes in the periventricular white matter. Mild global atrophy appropriate to age. No mass effect, midline shift, or acute intracranial hemorrhage. Postoperative changes in the right maxillary sinus with chronic mucosal thickening. Postoperative changes in the ethmoid air cells. Mastoid air cells clear. No skull fractures.  CT MAXILLOFACIAL FINDINGS  No acute fracture. No dislocation. Postoperative and chronic changes in the paranasal sinuses. Zygomatic arches are intact. Globes are intact. No orbital or vitreous hemorrhage.  CT CERVICAL SPINE FINDINGS  There is a severe injury at C7-T1. This involves fracture of both C7 lamina, the see set spinous process, and the right T1 transverse process. There is also distraction of the left side of the C7-T1 disc and distraction of the left C7-T1 facet joint. There is slight distraction of the right C7-T1 facet joint associated with kyphosis.  A superior endplate compression fracture of T1 is associated.  There is buckling of the right lamina of C6 compatible with buckle fracture. There is also a nondisplaced fracture of the left C6 lamina adjacent to the spinous process.  No obvious spinal hemorrhage based on CT.  Scattered degenerative changes throughout the cervical spine from C1- C5 are noted. No other fractures are identified above C6.  There is a 2.3 x 2.3 cm cystic area at the medial right apex.  IMPRESSION: Ustable injury at C7 and T1 involving posterior element fractures and ligamentous injury with distraction of the posterior elements and kyphosis.  T1 compression fracture.  Bilateral C6 lamina fractures.  2.3 cm cystic area at the medial left lung apex which may represent a dramatic meningocele. It is nonspecific.  Critical Value/emergent results were called by telephone at the time of  interpretation on 10/12/2013 at 10:17 am to Dr. Joseph Berkshire , who verbally acknowledged these results.  Electronically Signed: By: Maryclare Bean M.D. On:  10/12/2013 10:18   Ct Cervical Spine Wo Contrast  10/12/2013   ADDENDUM REPORT: 10/12/2013 13:50  ADDENDUM: At the C7 level, there are bone fragments encroaching upon the central canal from the right lamina fracture.   Electronically Signed   By: Maryclare Bean M.D.   On: 10/12/2013 13:50   10/12/2013   CLINICAL DATA:  Fall 2 days ago.  Thoracic compression fracture.  EXAM: CT HEAD WITHOUT CONTRAST  CT MAXILLOFACIAL WITHOUT CONTRAST  CT CERVICAL SPINE WITHOUT CONTRAST  TECHNIQUE: Multidetector CT imaging of the head, cervical spine, and maxillofacial structures were performed using the standard protocol without intravenous contrast. Multiplanar CT image reconstructions of the cervical spine and maxillofacial structures were also generated.  COMPARISON:  02/13/2012.  10/19/2007.  FINDINGS: CT HEAD FINDINGS  Mild chronic ischemic changes in the periventricular white matter. Mild global atrophy appropriate to age. No mass effect, midline shift, or acute intracranial hemorrhage. Postoperative changes in the right maxillary sinus with chronic mucosal thickening. Postoperative changes in the ethmoid air cells. Mastoid air cells clear. No skull fractures.  CT MAXILLOFACIAL FINDINGS  No acute fracture. No dislocation. Postoperative and chronic changes in the paranasal sinuses. Zygomatic arches are intact. Globes are intact. No orbital or vitreous hemorrhage.  CT CERVICAL SPINE FINDINGS  There is a severe injury at C7-T1. This involves fracture of both C7 lamina, the see set spinous process, and the right T1 transverse process. There is also distraction of the left side of the C7-T1 disc and distraction of the left C7-T1 facet joint. There is slight distraction of the right C7-T1 facet joint associated with kyphosis.  A superior endplate compression fracture of T1 is  associated.  There is buckling of the right lamina of C6 compatible with buckle fracture. There is also a nondisplaced fracture of the left C6 lamina adjacent to the spinous process.  No obvious spinal hemorrhage based on CT.  Scattered degenerative changes throughout the cervical spine from C1- C5 are noted. No other fractures are identified above C6.  There is a 2.3 x 2.3 cm cystic area at the medial right apex.  IMPRESSION: Ustable injury at C7 and T1 involving posterior element fractures and ligamentous injury with distraction of the posterior elements and kyphosis.  T1 compression fracture.  Bilateral C6 lamina fractures.  2.3 cm cystic area at the medial left lung apex which may represent a dramatic meningocele. It is nonspecific.  Critical Value/emergent results were called by telephone at the time of interpretation on 10/12/2013 at 10:17 am to Dr. Joseph Berkshire , who verbally acknowledged these results.  Electronically Signed: By: Maryclare Bean M.D. On: 10/12/2013 10:18   Dg C-arm 61-120 Min  10/12/2013   CLINICAL DATA:  C7 fracture.  Posterior fusion from C5 through T2.  EXAM: OPERATIVE CERVICAL SPINE 1 VIEW  COMPARISON:  None.  FINDINGS: Single spot image from the C-arm fluoroscopic device, AP view of the cervical spine, is submitted for interpretation postoperatively. This image demonstrates bilateral pedicle screws at C6, C7, T1 and T2.  IMPRESSION: Image obtained during C6 through T2 fusion demonstrating bilateral pedicle screws at C6, C7, T1 and T2.   Electronically Signed   By: Evangeline Dakin M.D.   On: 10/12/2013 20:12   Ct Maxillofacial Wo Cm  10/12/2013   ADDENDUM REPORT: 10/12/2013 13:50  ADDENDUM: At the C7 level, there are bone fragments encroaching upon the central canal from the right lamina fracture.   Electronically Signed   By: Duffy Rhody.D.  On: 10/12/2013 13:50   10/12/2013   CLINICAL DATA:  Fall 2 days ago.  Thoracic compression fracture.  EXAM: CT HEAD WITHOUT CONTRAST  CT  MAXILLOFACIAL WITHOUT CONTRAST  CT CERVICAL SPINE WITHOUT CONTRAST  TECHNIQUE: Multidetector CT imaging of the head, cervical spine, and maxillofacial structures were performed using the standard protocol without intravenous contrast. Multiplanar CT image reconstructions of the cervical spine and maxillofacial structures were also generated.  COMPARISON:  02/13/2012.  10/19/2007.  FINDINGS: CT HEAD FINDINGS  Mild chronic ischemic changes in the periventricular white matter. Mild global atrophy appropriate to age. No mass effect, midline shift, or acute intracranial hemorrhage. Postoperative changes in the right maxillary sinus with chronic mucosal thickening. Postoperative changes in the ethmoid air cells. Mastoid air cells clear. No skull fractures.  CT MAXILLOFACIAL FINDINGS  No acute fracture. No dislocation. Postoperative and chronic changes in the paranasal sinuses. Zygomatic arches are intact. Globes are intact. No orbital or vitreous hemorrhage.  CT CERVICAL SPINE FINDINGS  There is a severe injury at C7-T1. This involves fracture of both C7 lamina, the see set spinous process, and the right T1 transverse process. There is also distraction of the left side of the C7-T1 disc and distraction of the left C7-T1 facet joint. There is slight distraction of the right C7-T1 facet joint associated with kyphosis.  A superior endplate compression fracture of T1 is associated.  There is buckling of the right lamina of C6 compatible with buckle fracture. There is also a nondisplaced fracture of the left C6 lamina adjacent to the spinous process.  No obvious spinal hemorrhage based on CT.  Scattered degenerative changes throughout the cervical spine from C1- C5 are noted. No other fractures are identified above C6.  There is a 2.3 x 2.3 cm cystic area at the medial right apex.  IMPRESSION: Ustable injury at C7 and T1 involving posterior element fractures and ligamentous injury with distraction of the posterior elements and  kyphosis.  T1 compression fracture.  Bilateral C6 lamina fractures.  2.3 cm cystic area at the medial left lung apex which may represent a dramatic meningocele. It is nonspecific.  Critical Value/emergent results were called by telephone at the time of interpretation on 10/12/2013 at 10:17 am to Dr. Joseph Berkshire , who verbally acknowledged these results.  Electronically Signed: By: Maryclare Bean M.D. On: 10/12/2013 10:18    ASSESSMENT/PLAN:  C7-T1 fracture S/P C5 - T2 posterior fusion - for home health PT, OT, nursing and CNA Hypertension - well controlled; continue Coreg Hyperlipidemia - continue Lipitor and Questran Restless leg syndrome - stable; continue Mirapex Crohn's disease - stable; continue the [place] Hypothyroidism - continue Synthroid Allergic rhinitis - stable; continue Flonase Insomnia - recently started on trazodone Dementia - continue Aricept    I have filled out patient's discharge paperwork and written prescriptions.  Patient will receive home health PT, OT, Nursing and CNA.  DME provided: Bedside commode and stand dark rolling walker  Total discharge time: Greater than 30 minutes  Discharge time involved coordination of the discharge process with social worker, nursing staff and therapy department. Medical justification for home health services/DME verified.  CPT CODE: 98119  MEDINA-VARGAS,MONINA, [facility] [phone]

## 2013-11-07 DIAGNOSIS — S22018D Other fracture of first thoracic vertebra, subsequent encounter for fracture with routine healing: Secondary | ICD-10-CM | POA: Diagnosis not present

## 2013-11-07 DIAGNOSIS — M6281 Muscle weakness (generalized): Secondary | ICD-10-CM | POA: Diagnosis not present

## 2013-11-07 DIAGNOSIS — Z9181 History of falling: Secondary | ICD-10-CM | POA: Diagnosis not present

## 2013-11-07 DIAGNOSIS — F319 Bipolar disorder, unspecified: Secondary | ICD-10-CM | POA: Diagnosis not present

## 2013-11-07 DIAGNOSIS — G2581 Restless legs syndrome: Secondary | ICD-10-CM | POA: Diagnosis not present

## 2013-11-07 DIAGNOSIS — G4700 Insomnia, unspecified: Secondary | ICD-10-CM | POA: Diagnosis not present

## 2013-11-07 DIAGNOSIS — S12690G Other displaced fracture of seventh cervical vertebra, subsequent encounter for fracture with delayed healing: Secondary | ICD-10-CM | POA: Diagnosis not present

## 2013-11-07 DIAGNOSIS — I2510 Atherosclerotic heart disease of native coronary artery without angina pectoris: Secondary | ICD-10-CM | POA: Diagnosis not present

## 2013-11-07 DIAGNOSIS — M542 Cervicalgia: Secondary | ICD-10-CM | POA: Diagnosis not present

## 2013-11-07 DIAGNOSIS — E785 Hyperlipidemia, unspecified: Secondary | ICD-10-CM | POA: Diagnosis not present

## 2013-11-07 DIAGNOSIS — I10 Essential (primary) hypertension: Secondary | ICD-10-CM | POA: Diagnosis not present

## 2013-11-07 DIAGNOSIS — R2681 Unsteadiness on feet: Secondary | ICD-10-CM | POA: Diagnosis not present

## 2013-11-07 DIAGNOSIS — G4733 Obstructive sleep apnea (adult) (pediatric): Secondary | ICD-10-CM | POA: Diagnosis not present

## 2013-11-07 DIAGNOSIS — S12600D Unspecified displaced fracture of seventh cervical vertebra, subsequent encounter for fracture with routine healing: Secondary | ICD-10-CM | POA: Diagnosis not present

## 2013-11-07 DIAGNOSIS — C4A9 Merkel cell carcinoma, unspecified: Secondary | ICD-10-CM | POA: Diagnosis not present

## 2013-11-07 DIAGNOSIS — E039 Hypothyroidism, unspecified: Secondary | ICD-10-CM | POA: Diagnosis not present

## 2013-11-08 ENCOUNTER — Non-Acute Institutional Stay (SKILLED_NURSING_FACILITY): Payer: Medicare Other | Admitting: Internal Medicine

## 2013-11-08 ENCOUNTER — Encounter: Payer: Self-pay | Admitting: Internal Medicine

## 2013-11-08 DIAGNOSIS — G4700 Insomnia, unspecified: Secondary | ICD-10-CM | POA: Diagnosis not present

## 2013-11-08 NOTE — Progress Notes (Signed)
Patient ID: [name], female   DOB: 14-Oct-1932, 80 y.o.   MRN: [id number]   Place of Service: [facility]  Allergies  Allergen Reactions  . Prednisone Other (See Comments)    Patient had psychiatric episode with hallucinations. Caused 3.5 week long hospitalization & loss of memory   . Carbamazepine Nausea Only and Other (See Comments)    Tegretol; dizziness, blurred vision, nausea, headache, insomnia  . Celebrex [Celecoxib] Other (See Comments)    No relief from pain; ineffective  . Codeine Nausea And Vomiting  . Demerol [Meperidine] Nausea And Vomiting  . Diclofenac Sodium Other (See Comments)    severe headache  . Fentanyl Nausea And Vomiting and Other (See Comments)    IV Fentanyl; caused nausea and vomiting Fentanyl patch; caused chest pain, HBP, with second patch experienced vertigo and nausea  . Fluoxetine Hcl Nausea Only and Other (See Comments)    dizziness, blurred vision, nausea, headache, insomnia  . Hydromorphone Hcl Nausea And Vomiting  . Ibuprofen Other (See Comments)    Caused ulcers and colitis  . Indomethacin Nausea Only and Other (See Comments)    dizziness, blurred vision, nausea, headache, insomnia  . Lisinopril Cough  . Meperidine Hcl Nausea And Vomiting  . Morphine Nausea And Vomiting  . Oruvail [Ketoprofen] Other (See Comments)    No relief from pain; ineffective  . Pregabalin Other (See Comments)    severe restless legs, insomnia  . Propranolol Hcl Nausea Only and Other (See Comments)    dizziness, blurred vision, nausea, headache, insomnia  . Refresh Lacri-Lube [Artificial Tears] Swelling    Caused swelling, redness, hard crust on eyelids   . Relafen [Nabumetone] Other (See Comments)    No relief from pain; ineffective  . Ropinirole Hcl Other (See Comments)    severe restless legs and insomnia  . Sulfamethoxazole Nausea Only and Other (See Comments)    gas, loose stools  . Bacitracin Hives, Itching and Rash    redness    Code  Status: DNR  Goals of Care: Comfort and Quality of Life  Chief Complaint  Patient presents with  . Acute Visit    wants Ambien back for insomina    HPI  80 y.o. female with PMH of senile dementia, HTN, hypothyroidism, RLS, HLD, insomnia among others is being seen for an acute visit for the evaluation of insomnia. Resident was previously prescribed 10mg  of Ambien daily at bedtime for insomnia. However, Ambien was discontinued secondary to resident's complaint of having weird dreams. Trazodone 50mg  daily at bedtime was initiated, but resident requests to have Ambien back. During her visit, she stated that she slept well on Ambien and didn't recall having any vivid dreams. No other concerns reported.   Review of Systems Constitutional: Negative for fever, chills, and fatigue. Cardiovascular: Negative for chest pain, palpitations, and leg swelling Respiratory: Negative cough, shortness of breath, and wheezing.  Gastrointestinal: Negative for nausea and vomiting.  Neurological: Negative for dizziness, headache, weakness, and tremors.  Skin: Negative for rash and wound.   Psychiatric: Negative for nervous/anxious, agitation, depression, and suicidal ideas. Positive for insomnia  Past Medical History  Diagnosis Date  . OSA (obstructive sleep apnea)   . History of bronchitis   . Other diseases of lung, not elsewhere classified   . Hypertension   . CAD (coronary artery disease)     LAD 30% 2006  . History of atrial flutter   . Cerebrovascular disease   . Hyperlipemia   . Obesity   .  GERD (gastroesophageal reflux disease)   . Diverticulosis of colon   . IBS (irritable bowel syndrome)   . Colonic polyp   . Crohn's disease   . Hypothyroidism   . History of hyperparathyroidism   . Fibromyalgia   . Osteopenia   . History of headache   . Syncope   . Restless legs syndrome (RLS)   . Psychosis   . History of tuberculosis   . Radiation 05/2008    5000 cGy to left lower eyelid, parotid  lymphatics and upper neck  . Memory disturbance   . Bipolar affective     History of psychosis  . Merkel cell tumor     Left eyelid    Past Surgical History  Procedure Laterality Date  . Parathyroid adenoma surgery    . Cholecystectomy    . Moh';s surg left lower eye lid surgery and lid reconstruction surgery  2/10  . Bladder suspension    . Cataract extraction Bilateral   . Appendectomy    . Tonsillectomy and adenoidectomy    . Lumbar laminectomy    . Incision and drainage perirectal abscess    . Heel spur excision    . Nasal sinus surgery Right     Right maxillary  . Eye lid surgery  04/04/2011    [facility] by Dr. Norlene Duel  . Posterior cervical fusion/foraminotomy N/A 10/12/2013    Procedure: Cervical five-Thoracic two cervico-thoracic posterior fusion;  Surgeon: [name], MD;  Location: [facility];  Service: Neurosurgery;  Laterality: N/A;    History   Social History  . Marital Status: Married    Spouse Name: Joe    Number of Children: 2  . Years of Education: N/A   Occupational History  .     Social History Main Topics  . Smoking status: Former Smoker -- 6 years    Types: Cigarettes    Quit date: 01/08/1960  . Smokeless tobacco: Never Used  . Alcohol Use: No  . Drug Use: No  . Sexual Activity: Not on file   Other Topics Concern  . Not on file   Social History Narrative   Patient is married with 2 children   Patient is right handed   Patient is a retired [employer] scientist   Patient does not drink any caffeine.      Medication List       This list is accurate as of: 11/08/13 12:02 PM.  Always use your most recent med list.               aspirin EC 81 MG tablet  Take 81 mg by mouth every evening.     atorvastatin 40 MG tablet  Commonly known as:  LIPITOR  Take 1 tablet (40 mg total) by mouth daily.     Biotin 5000 MCG Tabs  Take 5,000 mcg by mouth every morning.     Calcium-Magnesium 500-250 MG Tabs  Take 2 tablets by mouth daily at 6 PM.      carvedilol 25 MG tablet  Commonly known as:  COREG  Take 1 tablet (25 mg total) by mouth 2 (two) times daily with a meal.     cholestyramine 4 GM/DOSE powder  Commonly known as:  QUESTRAN  Take 4 g by mouth daily with breakfast. 2 scoops in water at 7 am     co-enzyme Q-10 50 MG capsule  Take 50 mg by mouth daily at 6 PM.     donepezil 5 MG tablet  Commonly  known as:  ARICEPT  Take 10 mg by mouth daily. Take 2 tablets (10 mg) at 7 am     Evening Primrose Oil 500 MG Caps  Take 500 mg by mouth 2 (two) times daily.     fluticasone 50 MCG/ACT nasal spray  Commonly known as:  FLONASE  Place 2 sprays into both nostrils at bedtime.     guaifenesin 400 MG Tabs tablet  Commonly known as:  HUMIBID E  Take 400 mg by mouth 2 (two) times daily.     HYDROcodone-acetaminophen 5-325 MG per tablet  Commonly known as:  NORCO/VICODIN  Take 1 tablet by mouth every 4 (four) hours as needed for moderate pain.     levothyroxine 88 MCG tablet  Commonly known as:  SYNTHROID, LEVOTHROID  Take 1 tablet (88 mcg total) by mouth daily before breakfast.     lidocaine 5 %  Commonly known as:  LIDODERM  Place 1 patch onto the skin every 12 (twelve) hours as needed (back pain). Remove & Discard patch within 12 hours or as directed by MD     meclizine 25 MG tablet  Commonly known as:  ANTIVERT  Take 25 mg by mouth 3 (three) times daily as needed (for vertigo / dizziness).     mesalamine 1.2 G EC tablet  Commonly known as:  LIALDA  Take 3.6 g by mouth daily with lunch.     Omega 3 1000 MG Caps  Take 1,000 mg by mouth 2 (two) times daily.     ondansetron 4 MG tablet  Commonly known as:  ZOFRAN  Take 1 tablet (4 mg total) by mouth every 8 (eight) hours as needed for nausea or vomiting.     PHAZYME PO  Take 1-2 tablets by mouth 2 (two) times daily. Take 1 tablet at noon and 2 tablets at 4pm     pramipexole 0.5 MG tablet  Commonly known as:  MIRAPEX  Take 0.5 mg by mouth 2 (two) times daily.      Probiotic Caps  Take 1 capsule by mouth daily.     SUPER VITA-MINS PO  Take 1 tablet by mouth daily at 12 noon.     THERA TEARS ALLERGY 0.025 % ophthalmic solution  Generic drug:  ketotifen  Place 1 drop into both eyes daily as needed (dry eyes).     vitamin C 500 MG tablet  Commonly known as:  ASCORBIC ACID  Take 500 mg by mouth daily at 12 noon.     Vitamin D3 2000 UNITS Tabs  Take 2,000 Units by mouth daily at 12 noon.        Physical Exam Filed Vitals:   11/08/13 1154  BP: 138/68  Pulse: 72  Temp: 98.6 F (37 C)  Resp: 18   Constitutional: WDWN elderly female in no acute distress. Spouse at bedtime. Conversant and pleasant. HEENT: Normocephalic and atraumatic. PERRL. EOM intact. Oral mucosa moist. Posterior pharynx clear of any exudate or lesions.  Neck: Supple and nontender. No lymphadenopathy, masses, or thyromegaly. No JVD or carotid bruits. Cardiac: Normal S1, S2. RRR without appreciable murmurs, rubs, or gallops. Intact pulses intact. No dependent edema.  Lungs: No respiratory distress. Breath sounds clear bilaterally without rales, rhonchi, or wheezes. Abdomen: Audible bowel sounds in all quadrants. Soft, nontender, nondistended. No palpable mass.  Musculoskeletal: able to move all extremities. Hard-C collar in place Skin: Warm and dry. No rash noted. No erythema.  Neurological: Alert Psychiatric: Appropriate mood and affect.   Labs  Reviewed CBC Latest Ref Rng 10/12/2013 10/07/2013 04/01/2013  WBC 4.0 - 10.5 K/uL 7.5 8.5 6.0  Hemoglobin 12.0 - 15.0 g/dL 13.0 13.3 12.5  Hematocrit 36.0 - 46.0 % 38.6 38.2 37.5  Platelets 150 - 400 K/uL 177 127(L) 145.0(L)    CMP     Component Value Date/Time   NA 137 10/12/2013 1024   K 4.0 10/12/2013 1024   CL 98 10/12/2013 1024   CO2 26 10/12/2013 1024   GLUCOSE 110* 10/12/2013 1024   BUN 13 10/12/2013 1024   CREATININE 0.63 10/12/2013 1024   CALCIUM 9.4 10/12/2013 1024   PROT 7.4 10/07/2013 1550   ALBUMIN 3.9  10/07/2013 1550   AST 113* 10/07/2013 1550   ALT 62* 10/07/2013 1550   ALKPHOS 64 10/07/2013 1550   BILITOT 1.0 10/07/2013 1550   GFRNONAA 83* 10/12/2013 1024   GFRAA >90 10/12/2013 1024    Assessment & Plan 1. Insomnia Discontinue trazodone 50mg  daily at bedtime. Restart Ambien 10mg  daily at bedtime and continue to monitor. Continue fall risk precautions.    Family/Staff Communication Plan of care discuss with resident and professional staff members. Resident and professional staff members verbalize understanding and agree with plan of care. No additional questions or concerns reported.    Arthur Holms, MSN, AGNP-C [address], [place] [address] [phone] [8am-5pm] After hours: [phone]  I have personally reviewed this note and agree with the care plan  [facility], MD  [facility] [phone] (Monday-Friday 8 am - 5 pm) [phone] (afterhours)

## 2013-11-09 ENCOUNTER — Other Ambulatory Visit: Payer: Self-pay | Admitting: *Deleted

## 2013-11-09 MED ORDER — ZOLPIDEM TARTRATE 10 MG PO TABS: ORAL_TABLET | ORAL | Status: DC

## 2013-11-09 NOTE — Telephone Encounter (Signed)
[facility] 

## 2013-11-15 DIAGNOSIS — S12690G Other displaced fracture of seventh cervical vertebra, subsequent encounter for fracture with delayed healing: Secondary | ICD-10-CM | POA: Diagnosis not present

## 2013-11-15 DIAGNOSIS — M542 Cervicalgia: Secondary | ICD-10-CM | POA: Diagnosis not present

## 2013-11-17 ENCOUNTER — Telehealth: Payer: Self-pay | Admitting: Family Medicine

## 2013-11-17 ENCOUNTER — Encounter (HOSPITAL_COMMUNITY): Payer: Self-pay | Admitting: Emergency Medicine

## 2013-11-17 ENCOUNTER — Emergency Department (HOSPITAL_COMMUNITY)
Admission: EM | Admit: 2013-11-17 | Discharge: 2013-11-18 | Disposition: A | Discharge status: Home / Self Care | Payer: Medicare Other | Attending: Emergency Medicine | Admitting: Emergency Medicine

## 2013-11-17 DIAGNOSIS — Z8611 Personal history of tuberculosis: Secondary | ICD-10-CM | POA: Insufficient documentation

## 2013-11-17 DIAGNOSIS — E785 Hyperlipidemia, unspecified: Secondary | ICD-10-CM | POA: Insufficient documentation

## 2013-11-17 DIAGNOSIS — M542 Cervicalgia: Secondary | ICD-10-CM | POA: Insufficient documentation

## 2013-11-17 DIAGNOSIS — M549 Dorsalgia, unspecified: Secondary | ICD-10-CM | POA: Diagnosis not present

## 2013-11-17 DIAGNOSIS — I10 Essential (primary) hypertension: Secondary | ICD-10-CM | POA: Diagnosis not present

## 2013-11-17 DIAGNOSIS — Z79899 Other long term (current) drug therapy: Secondary | ICD-10-CM | POA: Insufficient documentation

## 2013-11-17 DIAGNOSIS — E039 Hypothyroidism, unspecified: Secondary | ICD-10-CM | POA: Diagnosis not present

## 2013-11-17 DIAGNOSIS — K589 Irritable bowel syndrome without diarrhea: Secondary | ICD-10-CM | POA: Diagnosis not present

## 2013-11-17 DIAGNOSIS — I2510 Atherosclerotic heart disease of native coronary artery without angina pectoris: Secondary | ICD-10-CM | POA: Insufficient documentation

## 2013-11-17 DIAGNOSIS — M797 Fibromyalgia: Secondary | ICD-10-CM | POA: Diagnosis not present

## 2013-11-17 DIAGNOSIS — Z7982 Long term (current) use of aspirin: Secondary | ICD-10-CM | POA: Diagnosis not present

## 2013-11-17 DIAGNOSIS — I679 Cerebrovascular disease, unspecified: Secondary | ICD-10-CM | POA: Insufficient documentation

## 2013-11-17 DIAGNOSIS — Z7951 Long term (current) use of inhaled steroids: Secondary | ICD-10-CM | POA: Diagnosis not present

## 2013-11-17 DIAGNOSIS — F918 Other conduct disorders: Secondary | ICD-10-CM | POA: Diagnosis not present

## 2013-11-17 DIAGNOSIS — Z87891 Personal history of nicotine dependence: Secondary | ICD-10-CM | POA: Insufficient documentation

## 2013-11-17 DIAGNOSIS — G4733 Obstructive sleep apnea (adult) (pediatric): Secondary | ICD-10-CM | POA: Diagnosis not present

## 2013-11-17 DIAGNOSIS — F919 Conduct disorder, unspecified: Secondary | ICD-10-CM | POA: Insufficient documentation

## 2013-11-17 DIAGNOSIS — J209 Acute bronchitis, unspecified: Secondary | ICD-10-CM | POA: Insufficient documentation

## 2013-11-17 DIAGNOSIS — E669 Obesity, unspecified: Secondary | ICD-10-CM | POA: Insufficient documentation

## 2013-11-17 DIAGNOSIS — Z86010 Personal history of colonic polyps: Secondary | ICD-10-CM | POA: Insufficient documentation

## 2013-11-17 DIAGNOSIS — R4689 Other symptoms and signs involving appearance and behavior: Secondary | ICD-10-CM

## 2013-11-17 DIAGNOSIS — R9431 Abnormal electrocardiogram [ECG] [EKG]: Secondary | ICD-10-CM | POA: Diagnosis not present

## 2013-11-17 DIAGNOSIS — Z85821 Personal history of Merkel cell carcinoma: Secondary | ICD-10-CM | POA: Insufficient documentation

## 2013-11-17 LAB — COMPREHENSIVE METABOLIC PANEL
ALT: 16 U/L (ref 0–35)
AST: 23 U/L (ref 0–37)
Albumin: 3.6 g/dL (ref 3.5–5.2)
Alkaline Phosphatase: 89 U/L (ref 39–117)
Anion gap: 12 (ref 5–15)
BILIRUBIN TOTAL: 0.7 mg/dL (ref 0.3–1.2)
BUN: 14 mg/dL (ref 6–23)
CHLORIDE: 103 meq/L (ref 96–112)
CO2: 27 meq/L (ref 19–32)
Calcium: 9.6 mg/dL (ref 8.4–10.5)
Creatinine, Ser: 0.7 mg/dL (ref 0.50–1.10)
GFR, EST NON AFRICAN AMERICAN: 80 mL/min — AB (ref >90)
GLUCOSE: 108 mg/dL — AB (ref 70–99)
POTASSIUM: 4.2 meq/L (ref 3.7–5.3)
SODIUM: 142 meq/L (ref 137–147)
Total Protein: 7.1 g/dL (ref 6.0–8.3)

## 2013-11-17 LAB — CBC WITH DIFFERENTIAL/PLATELET
Basophils Absolute: 0.1 10*3/uL (ref 0.0–0.1)
Basophils Relative: 1 % (ref 0–1)
EOS PCT: 4 % (ref 0–5)
Eosinophils Absolute: 0.3 10*3/uL (ref 0.0–0.7)
HCT: 38.4 % (ref 36.0–46.0)
Hemoglobin: 12.7 g/dL (ref 12.0–15.0)
LYMPHS ABS: 2.3 10*3/uL (ref 0.7–4.0)
LYMPHS PCT: 40 % (ref 12–46)
MCH: 31.1 pg (ref 26.0–34.0)
MCHC: 33.1 g/dL (ref 30.0–36.0)
MCV: 94.1 fL (ref 78.0–100.0)
Monocytes Absolute: 0.7 10*3/uL (ref 0.1–1.0)
Monocytes Relative: 11 % (ref 3–12)
NEUTROS ABS: 2.5 10*3/uL (ref 1.7–7.7)
NEUTROS PCT: 44 % (ref 43–77)
PLATELETS: 159 10*3/uL (ref 150–400)
RBC: 4.08 MIL/uL (ref 3.87–5.11)
RDW: 14.1 % (ref 11.5–15.5)
WBC: 5.8 10*3/uL (ref 4.0–10.5)

## 2013-11-17 LAB — ETHANOL: Alcohol, Ethyl (B): <11 mg/dL (ref 0–11)

## 2013-11-17 LAB — ACETAMINOPHEN LEVEL: Acetaminophen (Tylenol), Serum: <15 ug/mL (ref 10–30)

## 2013-11-17 LAB — SALICYLATE LEVEL: Salicylate Lvl: <2 mg/dL — ABNORMAL LOW (ref 2.8–20.0)

## 2013-11-17 MED ORDER — BIOTIN 5000 MCG PO TABS: 5000.0000 ug | ORAL_TABLET | ORAL | Status: DC

## 2013-11-17 MED ORDER — DONEPEZIL HCL 5 MG PO TABS
10.0000 mg | ORAL_TABLET | Freq: Every day | ORAL | Status: DC
  Administered 2013-11-18: 10 mg via ORAL
  Filled 2013-11-17: qty 2

## 2013-11-17 MED ORDER — ASPIRIN EC 81 MG PO TBEC
81.0000 mg | DELAYED_RELEASE_TABLET | Freq: Every evening | ORAL | Status: DC
  Filled 2013-11-17: qty 1

## 2013-11-17 MED ORDER — OMEGA-3-ACID ETHYL ESTERS 1 G PO CAPS
1000.0000 mg | ORAL_CAPSULE | Freq: Two times a day (BID) | ORAL | Status: DC
  Administered 2013-11-18 (×2): 1000 mg via ORAL
  Filled 2013-11-17 (×4): qty 1

## 2013-11-17 MED ORDER — VITAMIN D 1000 UNITS PO TABS
2000.0000 [IU] | ORAL_TABLET | Freq: Every day | ORAL | Status: DC
  Administered 2013-11-18: 2000 [IU] via ORAL
  Filled 2013-11-17 (×2): qty 2

## 2013-11-17 MED ORDER — MESALAMINE 1.2 G PO TBEC
3.6000 g | DELAYED_RELEASE_TABLET | Freq: Every day | ORAL | Status: DC
  Administered 2013-11-18: 3.6 g via ORAL
  Filled 2013-11-17 (×2): qty 3

## 2013-11-17 MED ORDER — CALCIUM-MAGNESIUM 500-250 MG PO TABS
2.0000 | ORAL_TABLET | Freq: Every day | ORAL | Status: DC
Start: 2013-11-18 — End: 2013-11-18

## 2013-11-17 MED ORDER — MECLIZINE HCL 25 MG PO TABS: 25.0000 mg | ORAL_TABLET | Freq: Three times a day (TID) | ORAL | Status: DC | PRN

## 2013-11-17 MED ORDER — LEVOTHYROXINE SODIUM 88 MCG PO TABS
88.0000 ug | ORAL_TABLET | Freq: Every day | ORAL | Status: DC
  Administered 2013-11-18: 88 ug via ORAL
  Filled 2013-11-17 (×3): qty 1

## 2013-11-17 MED ORDER — FLUTICASONE PROPIONATE 50 MCG/ACT NA SUSP
2.0000 | Freq: Every day | NASAL | Status: DC
  Administered 2013-11-18: 2 via NASAL
  Filled 2013-11-17: qty 16

## 2013-11-17 MED ORDER — ADULT MULTIVITAMIN W/MINERALS CH
1.0000 | ORAL_TABLET | Freq: Every day | ORAL | Status: DC
  Administered 2013-11-18: 1 via ORAL
  Filled 2013-11-17: qty 1

## 2013-11-17 MED ORDER — ATORVASTATIN CALCIUM 40 MG PO TABS
40.0000 mg | ORAL_TABLET | Freq: Every day | ORAL | Status: DC
  Administered 2013-11-18: 40 mg via ORAL
  Filled 2013-11-17 (×2): qty 1

## 2013-11-17 MED ORDER — ACETAMINOPHEN 325 MG PO TABS: 650.0000 mg | ORAL_TABLET | ORAL | Status: DC | PRN

## 2013-11-17 MED ORDER — PRAMIPEXOLE DIHYDROCHLORIDE 0.25 MG PO TABS
0.5000 mg | ORAL_TABLET | Freq: Two times a day (BID) | ORAL | Status: DC
  Administered 2013-11-18 (×2): 0.5 mg via ORAL
  Filled 2013-11-17 (×5): qty 2

## 2013-11-17 MED ORDER — KETOTIFEN FUMARATE 0.025 % OP SOLN
1.0000 [drp] | Freq: Every day | OPHTHALMIC | Status: DC | PRN
  Administered 2013-11-18: 1 [drp] via OPHTHALMIC
  Filled 2013-11-17: qty 5

## 2013-11-17 MED ORDER — LORAZEPAM 1 MG PO TABS
1.0000 mg | ORAL_TABLET | Freq: Three times a day (TID) | ORAL | Status: DC | PRN
  Administered 2013-11-18: 1 mg via ORAL
  Filled 2013-11-17: qty 1

## 2013-11-17 MED ORDER — ZOLPIDEM TARTRATE 5 MG PO TABS
5.0000 mg | ORAL_TABLET | Freq: Every day | ORAL | Status: DC
  Administered 2013-11-18: 5 mg via ORAL
  Filled 2013-11-17: qty 1

## 2013-11-17 MED ORDER — ONDANSETRON HCL 4 MG PO TABS: 4.0000 mg | ORAL_TABLET | Freq: Three times a day (TID) | ORAL | Status: DC | PRN

## 2013-11-17 MED ORDER — RISAQUAD PO CAPS
1.0000 | ORAL_CAPSULE | Freq: Every day | ORAL | Status: DC
  Administered 2013-11-18: 1 via ORAL
  Filled 2013-11-17 (×2): qty 1

## 2013-11-17 MED ORDER — VITAMIN C 500 MG PO TABS
500.0000 mg | ORAL_TABLET | Freq: Every day | ORAL | Status: DC
  Administered 2013-11-18: 500 mg via ORAL
  Filled 2013-11-17 (×2): qty 1

## 2013-11-17 MED ORDER — CARVEDILOL 25 MG PO TABS
25.0000 mg | ORAL_TABLET | Freq: Two times a day (BID) | ORAL | Status: DC
  Administered 2013-11-18: 25 mg via ORAL
  Filled 2013-11-17 (×4): qty 1

## 2013-11-17 MED ORDER — CHOLESTYRAMINE 4 G PO PACK
4.0000 g | PACK | Freq: Every day | ORAL | Status: DC
  Administered 2013-11-18: 4 g via ORAL
  Filled 2013-11-17 (×3): qty 1

## 2013-11-17 MED ORDER — SIMETHICONE 80 MG PO CHEW
80.0000 mg | CHEWABLE_TABLET | Freq: Once | ORAL | Status: AC
  Administered 2013-11-18: 80 mg via ORAL
  Filled 2013-11-17: qty 1

## 2013-11-17 MED ORDER — CO-ENZYME Q-10 50 MG PO CAPS: 50.0000 mg | ORAL_CAPSULE | Freq: Every day | ORAL | Status: DC

## 2013-11-17 NOTE — ED Provider Notes (Signed)
Pt is an elderly female with recent hx of cervical spine frx requiring surgery, she has had change in her behavior recently he coming more verbally aggressive with husband. The patient denies any complaints, appears very calm at this time however has been seen by nursing staff to have aggressive behavior towards husband. Heart and lungs normal, abdomen soft, neck immobilized with cervical collar, moving all 4 extremities without difficulty.  Anticipate psych consult, metabolic labs pending, thyroid pending  Medical screening examination/treatment/procedure(s) were conducted as a shared visit with non-physician practitioner(s) and myself.  I personally evaluated the patient during the encounter.  Clinical Impression:   Final diagnoses:  Aggressive behavior         Johnna Acosta, MD 11/19/13 1017

## 2013-11-17 NOTE — Telephone Encounter (Signed)
Patient Information:  Caller Name: [name]  Phone: [phone]  Patient: [name], [name]  Gender: Female  DOB: December 16, 1932  Age: 80 Years  PCP: Carolann Littler [facility])  Office Follow Up:  Does the office need to follow up with this patient?: No  Instructions For The Office: N/A  RN Note:  Unable to reach Jack/spouse at [phone] for triage. [name] called from [place] for help after speaking with parents for 5 minutes.  Pt is highly agitated; wants to leave facility. Lives with 89 yr spouse at [facility]; spouse does not know how to respond to her or calm her down.  Family did not think they should live in independent living.  Danayah seemed not to know who her daughter was on phone  Exhibiting some paranoid behavior, cussing, demanding to leave.  In hospital, she also had paranoid behaviour such as refusing to turn on lights, sitting in the dark for hours.  History of steroid induced psychosis.  Repeats conversations. Neither [name] or Barnabas Lister have notified assisted living staff of situation. Reports steady deterioration of [name]'s mental status that seems worse today.  RN called office nurse per nursing judgment for ED disposition.  Per Estill Bamberg, agreed [facility] ED is best place for immediate evaluation now; suggested daughter contact Independent living staff for assistance/intervention for ED care.  [name] verbalized understanding.  Symptoms  Reason For Call & Symptoms: Daughter suspects delirium. Reported significant decline with more confusion and agitation.  [name] was moved to independent living facility 11/16/13 with her husband after rehab following spinal fusion.  Husband does not know how to help his agitated witfe.  Reviewed Health History In EMR: Yes  Reviewed Medications In EMR: Yes  Reviewed Allergies In EMR: Yes  Reviewed Surgeries / Procedures: Yes  Date of Onset of Symptoms: 11/16/2013  Guideline(s) Used:  Confusion - Delirium  Disposition Per  Guideline:   Go to ED Now  Reason For Disposition Reached:   Bizarre or paranoid behavior  Advice Given:  N/A  Patient Will Follow Care Advice:  YES

## 2013-11-17 NOTE — ED Notes (Signed)
PT AGGRESSIVE AND DEMANDING TOWARD HUSBAND PT SCOLDED HUSBAND FOR LEAVING THE ROOM PTS SPOUSE HAD STEPPED OUT OF ROOM WITH SON IN LAW WRITER ABLE TO REDIRECT PT
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
## 2013-11-18 DIAGNOSIS — E039 Hypothyroidism, unspecified: Secondary | ICD-10-CM | POA: Diagnosis not present

## 2013-11-18 DIAGNOSIS — M542 Cervicalgia: Secondary | ICD-10-CM | POA: Diagnosis not present

## 2013-11-18 DIAGNOSIS — Z7951 Long term (current) use of inhaled steroids: Secondary | ICD-10-CM | POA: Diagnosis not present

## 2013-11-18 DIAGNOSIS — G2581 Restless legs syndrome: Secondary | ICD-10-CM | POA: Diagnosis not present

## 2013-11-18 DIAGNOSIS — I679 Cerebrovascular disease, unspecified: Secondary | ICD-10-CM | POA: Diagnosis not present

## 2013-11-18 DIAGNOSIS — E669 Obesity, unspecified: Secondary | ICD-10-CM | POA: Diagnosis not present

## 2013-11-18 DIAGNOSIS — Z85821 Personal history of Merkel cell carcinoma: Secondary | ICD-10-CM | POA: Diagnosis not present

## 2013-11-18 DIAGNOSIS — Z86010 Personal history of colonic polyps: Secondary | ICD-10-CM | POA: Diagnosis not present

## 2013-11-18 DIAGNOSIS — Z8611 Personal history of tuberculosis: Secondary | ICD-10-CM | POA: Diagnosis not present

## 2013-11-18 DIAGNOSIS — Z7982 Long term (current) use of aspirin: Secondary | ICD-10-CM | POA: Diagnosis not present

## 2013-11-18 DIAGNOSIS — F0391 Unspecified dementia with behavioral disturbance: Secondary | ICD-10-CM | POA: Diagnosis not present

## 2013-11-18 DIAGNOSIS — M797 Fibromyalgia: Secondary | ICD-10-CM | POA: Diagnosis not present

## 2013-11-18 DIAGNOSIS — K589 Irritable bowel syndrome without diarrhea: Secondary | ICD-10-CM | POA: Diagnosis not present

## 2013-11-18 DIAGNOSIS — M549 Dorsalgia, unspecified: Secondary | ICD-10-CM | POA: Diagnosis not present

## 2013-11-18 DIAGNOSIS — I2510 Atherosclerotic heart disease of native coronary artery without angina pectoris: Secondary | ICD-10-CM | POA: Diagnosis not present

## 2013-11-18 DIAGNOSIS — Z79899 Other long term (current) drug therapy: Secondary | ICD-10-CM | POA: Diagnosis not present

## 2013-11-18 DIAGNOSIS — W19XXXD Unspecified fall, subsequent encounter: Secondary | ICD-10-CM | POA: Diagnosis not present

## 2013-11-18 DIAGNOSIS — S22018D Other fracture of first thoracic vertebra, subsequent encounter for fracture with routine healing: Secondary | ICD-10-CM | POA: Diagnosis not present

## 2013-11-18 DIAGNOSIS — J209 Acute bronchitis, unspecified: Secondary | ICD-10-CM | POA: Diagnosis not present

## 2013-11-18 DIAGNOSIS — Z87891 Personal history of nicotine dependence: Secondary | ICD-10-CM | POA: Diagnosis not present

## 2013-11-18 DIAGNOSIS — I10 Essential (primary) hypertension: Secondary | ICD-10-CM | POA: Diagnosis not present

## 2013-11-18 DIAGNOSIS — E785 Hyperlipidemia, unspecified: Secondary | ICD-10-CM | POA: Diagnosis not present

## 2013-11-18 DIAGNOSIS — S12690D Other displaced fracture of seventh cervical vertebra, subsequent encounter for fracture with routine healing: Secondary | ICD-10-CM | POA: Diagnosis not present

## 2013-11-18 DIAGNOSIS — G4733 Obstructive sleep apnea (adult) (pediatric): Secondary | ICD-10-CM | POA: Diagnosis not present

## 2013-11-18 DIAGNOSIS — F919 Conduct disorder, unspecified: Secondary | ICD-10-CM | POA: Diagnosis not present

## 2013-11-18 LAB — URINALYSIS, ROUTINE W REFLEX MICROSCOPIC
BILIRUBIN URINE: NEGATIVE
Glucose, UA: NEGATIVE mg/dL
Ketones, ur: 15 mg/dL — AB
Nitrite: NEGATIVE
PROTEIN: NEGATIVE mg/dL
Specific Gravity, Urine: 1.027 (ref 1.005–1.030)
UROBILINOGEN UA: 0.2 mg/dL (ref 0.0–1.0)
pH: 5 (ref 5.0–8.0)

## 2013-11-18 LAB — RAPID URINE DRUG SCREEN, HOSP PERFORMED
Amphetamines: NOT DETECTED
BARBITURATES: NOT DETECTED
Benzodiazepines: NOT DETECTED
Cocaine: NOT DETECTED
OPIATES: NOT DETECTED
Tetrahydrocannabinol: NOT DETECTED

## 2013-11-18 LAB — URINE MICROSCOPIC-ADD ON

## 2013-11-18 LAB — TSH: TSH: 0.683 u[IU]/mL (ref 0.350–4.500)

## 2013-11-18 MED ORDER — CALCIUM CARBONATE 1250 (500 CA) MG PO TABS
1.0000 | ORAL_TABLET | Freq: Every day | ORAL | Status: DC
Start: 2013-11-18 — End: 2013-11-18
  Administered 2013-11-18: 500 mg via ORAL
  Filled 2013-11-18 (×2): qty 1

## 2013-11-18 MED ORDER — CEPHALEXIN 250 MG PO CAPS
500.0000 mg | ORAL_CAPSULE | Freq: Three times a day (TID) | ORAL | Status: DC
  Administered 2013-11-18: 500 mg via ORAL
  Filled 2013-11-18: qty 2

## 2013-11-18 MED ORDER — MAGNESIUM OXIDE 400 (241.3 MG) MG PO TABS
400.0000 mg | ORAL_TABLET | Freq: Every day | ORAL | Status: DC
  Administered 2013-11-18: 400 mg via ORAL
  Filled 2013-11-18 (×2): qty 1

## 2013-11-18 NOTE — BH Assessment (Addendum)
Reviewed notes prior to initiating assessment.   Requested cart be placed with pt for assessment.   Assessment to begin shortly.   Lear Ng, [facility] Triage Specialist 11/18/2013 12:32 AM

## 2013-11-18 NOTE — ED Notes (Signed)
Pt in paper scrubs at present.

## 2013-11-18 NOTE — ED Notes (Addendum)
Pt left prior to nurse giving patient discharge paperwork and obtaining esignature.
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
## 2013-11-18 NOTE — ED Provider Notes (Signed)
Psych has cleared patient Now being evaluated by SW/care management Pt is awake/alert, eating lunch Keflex ordered for UTI Urine culture ordered BP 138/67 mmHg  Pulse 85  Temp(Src) 98.8 F (37.1 C) (Oral)  Resp 16  Ht 5\' 2"  (1.575 m)  Wt 165 lb (74.844 kg)  BMI 30.17 kg/m2  SpO2 96%   Sharyon Cable, MD 11/18/13 1244

## 2013-11-18 NOTE — ED Notes (Signed)
Requested 08:00 medications from [place].

## 2013-11-18 NOTE — Telephone Encounter (Signed)
Pt daughter is informed per. Dr. Jacinto Reap. Pt is in the hospital

## 2013-11-18 NOTE — ED Notes (Signed)
Care transferred, report received from Santiago Glad, [place]

## 2013-11-18 NOTE — ED Notes (Signed)
Telepsych in process 

## 2013-11-18 NOTE — Telephone Encounter (Signed)
Left message for Daughter and Mr. [name] to give a call back.

## 2013-11-18 NOTE — ED Notes (Signed)
Breakfast tray ordered for the patient.  

## 2013-11-18 NOTE — BH Assessment (Signed)
Relayed results of assessment to Patriciaann Clan, [place]. Per Patriciaann Clan, PA pt does not meet inpt criteria. Pt should follow up with scheduled neurology appointment 02/14/13 and call to see if it could be moved up. OP resources to be provided if pt desires.   Discussed recommendations with Dr. Randal Buba. She may order social work consult.   Informed RN that pt is psychiatrically clear, and should follow up with neurologist, and see if appointment can be moved up.   Lear Ng, [facility] Triage Specialist 11/18/2013 1:43 AM

## 2013-11-18 NOTE — Telephone Encounter (Signed)
Agree with advice given

## 2013-11-18 NOTE — ED Notes (Signed)
Meal tray ordered for patient.

## 2013-11-18 NOTE — ED Notes (Signed)
This RN spoke to [employer] - family is to stay with pt until telepsych assessment is completed.

## 2013-11-18 NOTE — Progress Notes (Signed)
CSW spoke with pt and her husband re: d/c planning.  Explained to pt/husband that pt has been psychiatrically cleared to go home.  Husband explained that that the couple was living at [facility], but that they may have to transition to ALF in the near future.  CSW provided pt with ALF list.  Spoke with pt's daughter who returned to pt's bedside.  Daughter explaining that her mother was "psychotic" and did not understand why she was being sent home.  CSW explained that [facility] provider has cleared pt and was recommending that pt have her neurological appt.moved up from 02/2014.  Daughter requesting to speak to MD re: pt's care and EDP notified.  EDP spoke with one of pt's daughters on phone and the other at bedside.  Recommendations for f/u discussed and daughter agreeable to d/c plan.  Emotional support provided to daughter.

## 2013-11-18 NOTE — Discharge Instructions (Signed)
Cervical Spine Fracture, Stable A cervical spine fracture is a break or crack in one of the bones of the neck. A fracture is stable if the chances of it causing you problems while it is healing are very small. CAUSES   Vehicle accidents.  Injuries from sports such as diving, football, biking, wrestling, or skiing.  Occasionally, severe osteoporosis or other bone diseases, such as cancers that spread to bone or metabolic abnormalities. SYMPTOMS   Severe neck pain after an accident or fall.  Pain down your shoulders or arms.  Bruising or swelling on the back of your neck.  Numbness, tingling, muscle spasm, or weakness. DIAGNOSIS  Cervical spine fracture is diagnosed with the help of X-ray exams of your neck. Often a CT scan or MRI is used to confirm the diagnosis and help determine how your injury should be treated. Generally, an examination of your neck, arms, and legs, and the history of your injury prompts the health care provider to order these tests.  TREATMENT  A stable fracture needs to be treated with a brace or cervical collar. A cervical collar is a two-piece collar designed to keep your neck from moving during the healing process. HOME CARE INSTRUCTIONS  Limit physical activity to prevent worsening of the fracture.  Apply ice to areas of pain 3-4 times a day for 2 days.  Put ice in a bag.  Place a towel between your skin and the bag.  Leave the ice on for 15-20 minutes, 3-4 times a day.  You may have been given a cervical collar to wear.  Do not remove the collar unless instructed by your health care provider.  If you have long hair, keep it outside of the collar.  Ask your health care provider before making any adjustments to your collar. Minor adjustments may be required over time to improve comfort and reduce pressure on your chin or on the back of your head.  Keep your collar clean by wiping it with mild soap and water and drying it completely. The pads can be  hand washed with soap and water and air dried completely.  If you are allowed to remove the collar for cleaning or bathing, follow your health care provider's instructions on how to do so safely.  If you are allowed to remove the collar for cleaning and bathing, wash and dry the skin of your neck. Check your skin for irritation or sores. If you see any, tell your health care provider.  Only take over-the-counter or prescription medicines for pain, discomfort, or fever as directed by your health care provider.   Keep all follow-up appointments as directed by your health care provider. Not keeping an appointment could result in a chronic or permanent injury, pain, and disability. Additionally, X-rays or an MRI may be repeated 1-3 weeks after your initial appointment. This is to:  Make sure any other breaks or cracks were not missed.   Help identify stretched or torn ligaments.   Get your test results if you did not get them when you were first evaluated. The results will determine whether you need other tests or treatment. It is your responsibility to get the results. SEEK MEDICAL CARE IF: You have irritation or sores on your skin from the cervical collar. SEEK IMMEDIATE MEDICAL CARE IF:   You have increasing pain in your neck.   You develop difficulties swallowing or breathing.  You develop swelling in your neck.   You have numbness, weakness, burning pain, or movement  problems in the arms or legs.   You are unable to control your bowel or bladder (incontinence).   You have problems with coordination or difficulty walking. MAKE SURE YOU:   Understand these instructions.  Will watch your condition.  Will get help right away if you are not doing well or get worse. Document Released: 11/11/2003 Document Revised: 12/29/2012 Document Reviewed: 07/20/2012 [facility] Patient Information 2015 [place], [place]. This information is not intended to replace advice given to you by your  health care provider. Make sure you discuss any questions you have with your health care provider.    Emergency Department Resource Guide 1) Find a Doctor and Pay Out of Pocket Although you won't have to find out who is covered by your insurance plan, it is a good idea to ask around and get recommendations. You will then need to call the office and see if the doctor you have chosen will accept you as a new patient and what types of options they offer for patients who are self-pay. Some doctors offer discounts or will set up payment plans for their patients who do not have insurance, but you will need to ask so you aren't surprised when you get to your appointment.  2) Contact Your Local Health Department Not all health departments have doctors that can see patients for sick visits, but many do, so it is worth a call to see if yours does. If you don't know where your local health department is, you can check in your phone book. The CDC also has a tool to help you locate your state's health department, and many state websites also have listings of all of their local health departments.  3) Find a [facility] If your illness is not likely to be very severe or complicated, you may want to try a walk in clinic. These are popping up all over the country in pharmacies, drugstores, and shopping centers. They're usually staffed by nurse practitioners or physician assistants that have been trained to treat common illnesses and complaints. They're usually fairly quick and inexpensive. However, if you have serious medical issues or chronic medical problems, these are probably not your best option.  No Primary Care Doctor: - Call Health Connect at  [phone] - they can help you locate a primary care doctor that  accepts your insurance, provides certain services, etc. - Physician Referral Service- [phone]  Chronic Pain Problems: Organization         Address  Phone   Notes  [facility]  [phone] Patients need to be referred by their primary care doctor.   Medication Assistance: Organization         Address  Phone   Notes  [facility] Medication [facility] [address] [place], [place] [address] [phone] --Must be a resident of [facility] -- Must have NO insurance coverage whatsoever (no Medicaid/ Medicare, etc.) -- The pt. MUST have a primary care doctor that directs their care regularly and follows them in the community   [facility]ist  [phone]   [employer]  [phone]    Agencies that provide inexpensive medical care: Organization         Address  Phone   Notes  [place]  [phone]   [facility]    [phone]   [facility] [place], [place] [address] [phone]   [place]  [address] [place] [phone]   Planned Parenthood    [phone]   [facility]    [phone]   [employer] and [address], [place] Phone:  [phone], Fax:  [phone] Hours of Operation:  9 am - 6 pm, M-F.  Also accepts Medicaid/Medicare and self-pay.  [facility] for [place], [address], [place] Phone: [phone], Fax: [phone]. Hours of Operation:  8:30 am - 5:30 pm, M-F.  Also accepts Medicaid and self-pay.  [facility] [place] [address], [place] Phone: [phone]   [place], [place], [place] [phone], Ext. [phone] Mondays & Thursdays: 7-9 AM.  First 15 patients are seen on a first come, first serve basis.    [place] Providers:  Organization         Address  Phone   Notes  [facility] [address], [place] [phone] Also accepts self-pay patients.  [facility] [address], [place]  [phone]   [place], [address], [place] [phone]   [facility] [address], [place] [phone]   Lucianne Lei [address], [place]   [phone] Only accepts [place] Access [place] patients after they have their name applied to their card.   Self-Pay (no insurance) in [facility]:  Organization         Address  Phone   Notes  Sickle Cell Patients, [facility] [place] [phone]   [facility] [place] [phone]   [facility] [place]  [place], [place], [place] [phone]   [facility]/Dr. Osei-Bonsu  [address] [place] or [address], [place] [phone] Phone number for both [place] and [place] locations is the same.  Urgent Medical and [facility] [address], [place] [phone]   [facility] [address] [place] or [address] [phone]   [facility] [address] [place] [phone], phone; [phone], fax Sees patients 1st and 3rd Saturday of every month.  Must not qualify for public or private insurance (i.e. Medicaid, Medicare, [place] Health Choice, Veterans' Benefits)  Household income should be no more than 200% of the poverty level The clinic cannot treat you if you are pregnant or think you are pregnant  Sexually transmitted diseases are not treated at the clinic.    Dental Care: Organization         Address  Phone  Notes  [facility] Department of [facility] [place] [phone] Accepts children up to age 14 who are enrolled in [place] or [place]; pregnant women with a Medicaid card; and children who have applied for Medicaid or  Health  Choice, but were declined, whose parents can pay a reduced fee at time of service.  [facility] Department of [facility]  [address], [place] [phone] Accepts children up to age 73 who are enrolled in [place] or [place]; pregnant women with a Medicaid card; and children who have applied  for Medicaid or [place] Health Choice, but were declined, whose parents can pay a reduced fee at time of service.  [facility] [place] Adult [facility] Access PRO[facility]  [place] [phone] Patients are seen by appointment only. Walk-ins are not accepted. [place] will see patients 88 years of age and older. Monday - Tuesday (8am-5pm) Most Wednesdays (8:30-5pm) $30 per visit, cash only  [facility] Adult [facility] Access [facility]  [address], [facility] [phone] Patients are seen by appointment only. Walk-ins are not accepted. [place] will see patients 42 years of age and older. One Wednesday Evening (Monthly: Volunteer Based).  $30 per visit, cash only  [place]  [phone] for adults; Children under age 18, call Graduate Pediatric Dentistry at [phone]. Children aged 67-14, please call [phone] to request a pediatric application.  Dental services are provided in all areas of dental care including fillings, crowns and bridges, complete and partial dentures, implants, gum treatment, root canals, and extractions. Preventive care is also provided. Treatment is provided to both adults and children. Patients are selected via a lottery and there is often a waiting list.   [facility] [address] [place]  [phone] [email or web address]   [facility] [address] [place], [place] [phone], Ext. [phone] Second and Fourth Thursday of each month, opens at 6:30 AM; Clinic ends at 9 AM.  Patients are seen on a first-come first-served basis, and a limited number are seen during each  clinic.   [facility]  [address] Hillard Danker [place], [place] [phone]   Eligibility Requirements You must have lived in [place], [place], or [place] counties for at least the last three months.   You cannot be eligible for state or federal sponsored [employer], including [employer], [place], or [employer].   You generally cannot be eligible for healthcare insurance through your employer.    How to apply: Eligibility screenings are held every Tuesday and Wednesday afternoon from 1:00 pm until 4:00 pm. You do not need an appointment for the interview!  [facility] [address] [place], [place]   [place]  [place] Department  [place]  [phone]    Behavioral Health Resources in the Community: Intensive Outpatient Programs Organization         Address  Phone  Notes  [place]. [address], [place] [phone]   [facility]patient [address] [place], [place]   [facility]: [facility] [address], [place], [place]   [place] [address]  [place], [place] or [phone]   Substance Abuse Resources Organization         Address  Phone  Notes  Alcohol and Drug Services  [phone]   [place]  [phone]   [facility] [place]   Chinita Pester  [phone]   Residential & Outpatient Substance Abuse Program  [phone]   Psychological Services Organization         Address  Phone  Notes  [facility] [place]  [place]  [phone]   [place] [address], [place] or [phone]    Mobile Crisis Teams Organization         Address  Phone  Notes  [facility], Mobile  Crisis Care Unit  [phone]   [facility]  [address] [place], [place]   [facility] [address] [place] [phone]    Self-Help/Support Groups Organization         Address  Phone             Notes  [place]. of [place] - variety of support groups  [place] Call for more information  Narcotics Anonymous (NA), [facility] [address], [employer] [place]  2 meetings at this location   Special educational needs teacher         Address  Phone  Notes  [facility] [place],    [place]  [phone]   [facility]  [address], [place] [address], [place], [place]   [place], [place] [phone] Admissions: 8am-3pm M-F  Incentives Substance [place] [address],    [place], [place] [phone]   [facility] [address], [place], [place]   [facility] [address],  [place], [place]   [facility] - Intensive Outpatient [address], [place], [place]   [facility] [place].,  [place], [place] [phone] or [phone]   [facility] ([facility]) [address], [place], [place] Accepts Medicaid  [facility] [place] [address]  [place] [phone] Substance Abuse/Addiction Treatment   [facility] Organization         Address  Phone  Notes  [facility]  [phone]   Domenic Schwab, PhD [address] Arlis Porta [place], [place]   [phone] or [phone]   [place], [place] [phone]   [facility] [address], [place], [place] [phone] Insurance/Medicaid/sponsorship through [facility] and Families [address]  [place]                                    [place], [place] [phone] [place] [address], [place] [phone]    Dr. Adele Schilder  [phone]   [facility] Dept. 1) [address], [place] 2) [place] 3)  [place] [address], [place] [phone]  [phone]   [place] [phone] or [phone] (After Hours)      Intermittent Explosive Disorder Intermittent explosive disorder is defined by episodes of explosive rage or aggression. It can result in injuries to you or others. An episode is usually triggered when you feel provoked. Once provoked, your explosive behavior is an extreme overreaction to the situation. Usually you react within minutes or hours of becoming angered, and then your explosive behavior stops automatically. After the episode, you may feel guilt, embarrassment, or remorse. Examples of explosive episodes include:  Vicious name calling.  Demeaning verbal slurs.  Destruction of property, such as punching holes in walls, breaking furniture, and throwing items.  Physical violence, such as punching, slapping, and choking. The rage occurs in episodes. The episodes may occur in clusters. They may be separated by days, weeks, or  months. Each episode usually lasts between 10 and 20 minutes. However, episodes can last for hours. Between episodes the person may show no signs of impulsive or aggressive outbursts. Many people with this disorder report the following sensations just before or during an episode:  A tingling, burning, or prickling sensation (feeling).  Shaking.  Rapid heart rate.  Chest tightness.  Head pressure.  Hearing an echo. RISK FACTORS Risk factors of intermittent explosive disorder are listed as follows:   Serotonin or testosterone level imbalances in the brain.  Minor irregular  activity in the brain.  Feelings of insecurity or low self esteem or anxiety. TREATMENT  Treatment for intermittent explosive disorder usually includes a combination of counseling, group support, and medication. Some forms of counselding focus on underlying feelings, motivations, and thoughts. Other forms of counseling examine the patterns of episodes in order to recognize the beginning urges that lead to an explosive episode, identify trigers, and develop strategies to prevent episodes. Anger management support groups can help by providing an opportunity for you to discuss your experiences with others who have had similar experiences and to share management techniques. Medications prescribed for treatment include antidepressants, mood stabilizers, and anticonvulsants. Beta blockers, which are usually prescribed to slow heart rate or control blood pressure, may also help control explosive rages. Document Released: 04/02/2007 Document Revised: 05/10/2013 Document Reviewed: 02/19/2013 [facility] Patient Information 2015 [place], [place]. This information is not intended to replace advice given to you by your health care provider. Make sure you discuss any questions you have with your health care provider.

## 2013-11-18 NOTE — ED Notes (Signed)
Social worker at bedside speaking to patient and family.

## 2013-11-18 NOTE — ED Notes (Signed)
A recliner placed at bedside and patient assisted in to the chair.  Patient taken off the monitor at this time.

## 2013-11-18 NOTE — ED Notes (Signed)
Patient given her breakfast tray.
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
## 2013-11-19 DIAGNOSIS — I10 Essential (primary) hypertension: Secondary | ICD-10-CM | POA: Diagnosis not present

## 2013-11-19 DIAGNOSIS — F0391 Unspecified dementia with behavioral disturbance: Secondary | ICD-10-CM | POA: Diagnosis not present

## 2013-11-19 DIAGNOSIS — S22018D Other fracture of first thoracic vertebra, subsequent encounter for fracture with routine healing: Secondary | ICD-10-CM | POA: Diagnosis not present

## 2013-11-19 DIAGNOSIS — I2510 Atherosclerotic heart disease of native coronary artery without angina pectoris: Secondary | ICD-10-CM | POA: Diagnosis not present

## 2013-11-19 DIAGNOSIS — G2581 Restless legs syndrome: Secondary | ICD-10-CM | POA: Diagnosis not present

## 2013-11-19 DIAGNOSIS — S12690D Other displaced fracture of seventh cervical vertebra, subsequent encounter for fracture with routine healing: Secondary | ICD-10-CM | POA: Diagnosis not present

## 2013-11-19 LAB — URINE CULTURE: Colony Count: 30000

## 2013-11-20 DIAGNOSIS — G2581 Restless legs syndrome: Secondary | ICD-10-CM | POA: Diagnosis not present

## 2013-11-20 DIAGNOSIS — S22018D Other fracture of first thoracic vertebra, subsequent encounter for fracture with routine healing: Secondary | ICD-10-CM | POA: Diagnosis not present

## 2013-11-20 DIAGNOSIS — F0391 Unspecified dementia with behavioral disturbance: Secondary | ICD-10-CM | POA: Diagnosis not present

## 2013-11-20 DIAGNOSIS — I10 Essential (primary) hypertension: Secondary | ICD-10-CM | POA: Diagnosis not present

## 2013-11-20 DIAGNOSIS — S12690D Other displaced fracture of seventh cervical vertebra, subsequent encounter for fracture with routine healing: Secondary | ICD-10-CM | POA: Diagnosis not present

## 2013-11-20 DIAGNOSIS — I2510 Atherosclerotic heart disease of native coronary artery without angina pectoris: Secondary | ICD-10-CM | POA: Diagnosis not present

## 2013-11-23 ENCOUNTER — Telehealth: Payer: Self-pay | Admitting: Family Medicine

## 2013-11-23 DIAGNOSIS — S12690D Other displaced fracture of seventh cervical vertebra, subsequent encounter for fracture with routine healing: Secondary | ICD-10-CM | POA: Diagnosis not present

## 2013-11-23 DIAGNOSIS — F0391 Unspecified dementia with behavioral disturbance: Secondary | ICD-10-CM | POA: Diagnosis not present

## 2013-11-23 DIAGNOSIS — S22018D Other fracture of first thoracic vertebra, subsequent encounter for fracture with routine healing: Secondary | ICD-10-CM | POA: Diagnosis not present

## 2013-11-23 DIAGNOSIS — I2510 Atherosclerotic heart disease of native coronary artery without angina pectoris: Secondary | ICD-10-CM | POA: Diagnosis not present

## 2013-11-23 DIAGNOSIS — G2581 Restless legs syndrome: Secondary | ICD-10-CM | POA: Diagnosis not present

## 2013-11-23 DIAGNOSIS — I10 Essential (primary) hypertension: Secondary | ICD-10-CM | POA: Diagnosis not present

## 2013-11-23 NOTE — Telephone Encounter (Signed)
Left message for patient husband to return call.  

## 2013-11-23 NOTE — Telephone Encounter (Signed)
Would recommend avoiding meds such as Antivert as much as possible as they can cause significant sedation and high risk for falls. Confirm but I think she has taken hydrocodone in past (she has multiple drug allergies).  If she has taken this without problem may prescribe Vicodin 5/325 mg one every 4-6 hours prn severe pain #60 with no refill and do not take unless pain is moderate to severe.

## 2013-11-23 NOTE — Telephone Encounter (Signed)
Patient fell in the home about a month ago and is seeing a physical therapist. Patient husband states that the physical therapist needs a RX for pain meds and Vertigo. Please call the RX in to [facility]

## 2013-11-24 DIAGNOSIS — I2510 Atherosclerotic heart disease of native coronary artery without angina pectoris: Secondary | ICD-10-CM | POA: Diagnosis not present

## 2013-11-24 DIAGNOSIS — I10 Essential (primary) hypertension: Secondary | ICD-10-CM | POA: Diagnosis not present

## 2013-11-24 DIAGNOSIS — G2581 Restless legs syndrome: Secondary | ICD-10-CM | POA: Diagnosis not present

## 2013-11-24 DIAGNOSIS — S22018D Other fracture of first thoracic vertebra, subsequent encounter for fracture with routine healing: Secondary | ICD-10-CM | POA: Diagnosis not present

## 2013-11-24 DIAGNOSIS — S12690D Other displaced fracture of seventh cervical vertebra, subsequent encounter for fracture with routine healing: Secondary | ICD-10-CM | POA: Diagnosis not present

## 2013-11-24 DIAGNOSIS — F0391 Unspecified dementia with behavioral disturbance: Secondary | ICD-10-CM | POA: Diagnosis not present

## 2013-11-24 NOTE — Telephone Encounter (Signed)
Spoke with patient and she stated that the patient did not seem like she was in pain. Pt is taking Ambien that she knows of.  Will wait to see if the facility that the patient is living at will send over a fax asking for medication.

## 2013-11-25 ENCOUNTER — Telehealth: Payer: Self-pay | Admitting: Family Medicine

## 2013-11-25 DIAGNOSIS — S12690D Other displaced fracture of seventh cervical vertebra, subsequent encounter for fracture with routine healing: Secondary | ICD-10-CM | POA: Diagnosis not present

## 2013-11-25 DIAGNOSIS — S22018D Other fracture of first thoracic vertebra, subsequent encounter for fracture with routine healing: Secondary | ICD-10-CM | POA: Diagnosis not present

## 2013-11-25 DIAGNOSIS — G2581 Restless legs syndrome: Secondary | ICD-10-CM | POA: Diagnosis not present

## 2013-11-25 DIAGNOSIS — I10 Essential (primary) hypertension: Secondary | ICD-10-CM | POA: Diagnosis not present

## 2013-11-25 DIAGNOSIS — I2510 Atherosclerotic heart disease of native coronary artery without angina pectoris: Secondary | ICD-10-CM | POA: Diagnosis not present

## 2013-11-25 DIAGNOSIS — F0391 Unspecified dementia with behavioral disturbance: Secondary | ICD-10-CM | POA: Diagnosis not present

## 2013-11-25 MED ORDER — ZOLPIDEM TARTRATE 5 MG PO TABS: 5.0000 mg | ORAL_TABLET | Freq: Every evening | ORAL | Status: DC | PRN

## 2013-11-25 MED ORDER — TRAMADOL HCL 50 MG PO TABS: 50.0000 mg | ORAL_TABLET | Freq: Four times a day (QID) | ORAL | Status: DC | PRN

## 2013-11-25 NOTE — Telephone Encounter (Signed)
Refill Ambien 5 mg one po qhs prn #30 with 3 refills Tramadol 50 mg one every 6 hours prn pain #90 with 2 refills.

## 2013-11-25 NOTE — Telephone Encounter (Signed)
Debbie from [facility] called to let you know : Pt had a fall. Broke her back. Pt in a neck brace. Pt unable to sleep. Pt used to take [place].would like to know if you would refill that for her.  Also,   Pt sensative to pain meds, can only take tramadol. Pt would like something to help her sleep and is out of pain meds.  [facility] [address]  Debbie w/ [facility] phone if you need to speak w/ her.[phone]

## 2013-11-25 NOTE — Telephone Encounter (Signed)
RX's called into pharmacy

## 2013-11-25 NOTE — Telephone Encounter (Signed)
Left message on Debbie Vm that print got Ambien on 11/09/13 print out. With 5 refills.

## 2013-11-26 DIAGNOSIS — S22018D Other fracture of first thoracic vertebra, subsequent encounter for fracture with routine healing: Secondary | ICD-10-CM | POA: Diagnosis not present

## 2013-11-26 DIAGNOSIS — F0391 Unspecified dementia with behavioral disturbance: Secondary | ICD-10-CM | POA: Diagnosis not present

## 2013-11-26 DIAGNOSIS — G2581 Restless legs syndrome: Secondary | ICD-10-CM | POA: Diagnosis not present

## 2013-11-26 DIAGNOSIS — I10 Essential (primary) hypertension: Secondary | ICD-10-CM | POA: Diagnosis not present

## 2013-11-26 DIAGNOSIS — I2510 Atherosclerotic heart disease of native coronary artery without angina pectoris: Secondary | ICD-10-CM | POA: Diagnosis not present

## 2013-11-26 DIAGNOSIS — S12690D Other displaced fracture of seventh cervical vertebra, subsequent encounter for fracture with routine healing: Secondary | ICD-10-CM | POA: Diagnosis not present

## 2013-11-29 DIAGNOSIS — I10 Essential (primary) hypertension: Secondary | ICD-10-CM | POA: Diagnosis not present

## 2013-11-29 DIAGNOSIS — I2510 Atherosclerotic heart disease of native coronary artery without angina pectoris: Secondary | ICD-10-CM | POA: Diagnosis not present

## 2013-11-29 DIAGNOSIS — F0391 Unspecified dementia with behavioral disturbance: Secondary | ICD-10-CM | POA: Diagnosis not present

## 2013-11-29 DIAGNOSIS — S22018D Other fracture of first thoracic vertebra, subsequent encounter for fracture with routine healing: Secondary | ICD-10-CM | POA: Diagnosis not present

## 2013-11-29 DIAGNOSIS — G2581 Restless legs syndrome: Secondary | ICD-10-CM | POA: Diagnosis not present

## 2013-11-29 DIAGNOSIS — S12690D Other displaced fracture of seventh cervical vertebra, subsequent encounter for fracture with routine healing: Secondary | ICD-10-CM | POA: Diagnosis not present

## 2013-11-30 DIAGNOSIS — I10 Essential (primary) hypertension: Secondary | ICD-10-CM | POA: Diagnosis not present

## 2013-11-30 DIAGNOSIS — I2510 Atherosclerotic heart disease of native coronary artery without angina pectoris: Secondary | ICD-10-CM | POA: Diagnosis not present

## 2013-11-30 DIAGNOSIS — S12690D Other displaced fracture of seventh cervical vertebra, subsequent encounter for fracture with routine healing: Secondary | ICD-10-CM | POA: Diagnosis not present

## 2013-11-30 DIAGNOSIS — F0391 Unspecified dementia with behavioral disturbance: Secondary | ICD-10-CM | POA: Diagnosis not present

## 2013-11-30 DIAGNOSIS — G2581 Restless legs syndrome: Secondary | ICD-10-CM | POA: Diagnosis not present

## 2013-11-30 DIAGNOSIS — S22018D Other fracture of first thoracic vertebra, subsequent encounter for fracture with routine healing: Secondary | ICD-10-CM | POA: Diagnosis not present

## 2013-12-01 DIAGNOSIS — S12690D Other displaced fracture of seventh cervical vertebra, subsequent encounter for fracture with routine healing: Secondary | ICD-10-CM | POA: Diagnosis not present

## 2013-12-01 DIAGNOSIS — I10 Essential (primary) hypertension: Secondary | ICD-10-CM | POA: Diagnosis not present

## 2013-12-01 DIAGNOSIS — I2510 Atherosclerotic heart disease of native coronary artery without angina pectoris: Secondary | ICD-10-CM | POA: Diagnosis not present

## 2013-12-01 DIAGNOSIS — S22018D Other fracture of first thoracic vertebra, subsequent encounter for fracture with routine healing: Secondary | ICD-10-CM | POA: Diagnosis not present

## 2013-12-01 DIAGNOSIS — F0391 Unspecified dementia with behavioral disturbance: Secondary | ICD-10-CM | POA: Diagnosis not present

## 2013-12-01 DIAGNOSIS — G2581 Restless legs syndrome: Secondary | ICD-10-CM | POA: Diagnosis not present

## 2013-12-06 DIAGNOSIS — I2510 Atherosclerotic heart disease of native coronary artery without angina pectoris: Secondary | ICD-10-CM | POA: Diagnosis not present

## 2013-12-06 DIAGNOSIS — F0391 Unspecified dementia with behavioral disturbance: Secondary | ICD-10-CM | POA: Diagnosis not present

## 2013-12-06 DIAGNOSIS — S12690D Other displaced fracture of seventh cervical vertebra, subsequent encounter for fracture with routine healing: Secondary | ICD-10-CM | POA: Diagnosis not present

## 2013-12-06 DIAGNOSIS — G2581 Restless legs syndrome: Secondary | ICD-10-CM | POA: Diagnosis not present

## 2013-12-06 DIAGNOSIS — S22018D Other fracture of first thoracic vertebra, subsequent encounter for fracture with routine healing: Secondary | ICD-10-CM | POA: Diagnosis not present

## 2013-12-06 DIAGNOSIS — I10 Essential (primary) hypertension: Secondary | ICD-10-CM | POA: Diagnosis not present

## 2013-12-07 DIAGNOSIS — I2510 Atherosclerotic heart disease of native coronary artery without angina pectoris: Secondary | ICD-10-CM | POA: Diagnosis not present

## 2013-12-07 DIAGNOSIS — S12690D Other displaced fracture of seventh cervical vertebra, subsequent encounter for fracture with routine healing: Secondary | ICD-10-CM | POA: Diagnosis not present

## 2013-12-07 DIAGNOSIS — S22018D Other fracture of first thoracic vertebra, subsequent encounter for fracture with routine healing: Secondary | ICD-10-CM | POA: Diagnosis not present

## 2013-12-07 DIAGNOSIS — I10 Essential (primary) hypertension: Secondary | ICD-10-CM | POA: Diagnosis not present

## 2013-12-07 DIAGNOSIS — F0391 Unspecified dementia with behavioral disturbance: Secondary | ICD-10-CM | POA: Diagnosis not present

## 2013-12-07 DIAGNOSIS — G2581 Restless legs syndrome: Secondary | ICD-10-CM | POA: Diagnosis not present

## 2013-12-08 DIAGNOSIS — G2581 Restless legs syndrome: Secondary | ICD-10-CM | POA: Diagnosis not present

## 2013-12-08 DIAGNOSIS — I2510 Atherosclerotic heart disease of native coronary artery without angina pectoris: Secondary | ICD-10-CM | POA: Diagnosis not present

## 2013-12-08 DIAGNOSIS — S12690D Other displaced fracture of seventh cervical vertebra, subsequent encounter for fracture with routine healing: Secondary | ICD-10-CM | POA: Diagnosis not present

## 2013-12-08 DIAGNOSIS — I10 Essential (primary) hypertension: Secondary | ICD-10-CM | POA: Diagnosis not present

## 2013-12-08 DIAGNOSIS — S22018D Other fracture of first thoracic vertebra, subsequent encounter for fracture with routine healing: Secondary | ICD-10-CM | POA: Diagnosis not present

## 2013-12-08 DIAGNOSIS — F0391 Unspecified dementia with behavioral disturbance: Secondary | ICD-10-CM | POA: Diagnosis not present

## 2013-12-09 DIAGNOSIS — I2510 Atherosclerotic heart disease of native coronary artery without angina pectoris: Secondary | ICD-10-CM | POA: Diagnosis not present

## 2013-12-09 DIAGNOSIS — F0391 Unspecified dementia with behavioral disturbance: Secondary | ICD-10-CM | POA: Diagnosis not present

## 2013-12-09 DIAGNOSIS — I10 Essential (primary) hypertension: Secondary | ICD-10-CM | POA: Diagnosis not present

## 2013-12-09 DIAGNOSIS — G2581 Restless legs syndrome: Secondary | ICD-10-CM | POA: Diagnosis not present

## 2013-12-09 DIAGNOSIS — S12690D Other displaced fracture of seventh cervical vertebra, subsequent encounter for fracture with routine healing: Secondary | ICD-10-CM | POA: Diagnosis not present

## 2013-12-09 DIAGNOSIS — S22018D Other fracture of first thoracic vertebra, subsequent encounter for fracture with routine healing: Secondary | ICD-10-CM | POA: Diagnosis not present

## 2013-12-10 DIAGNOSIS — S12690D Other displaced fracture of seventh cervical vertebra, subsequent encounter for fracture with routine healing: Secondary | ICD-10-CM | POA: Diagnosis not present

## 2013-12-10 DIAGNOSIS — I2510 Atherosclerotic heart disease of native coronary artery without angina pectoris: Secondary | ICD-10-CM | POA: Diagnosis not present

## 2013-12-10 DIAGNOSIS — I10 Essential (primary) hypertension: Secondary | ICD-10-CM | POA: Diagnosis not present

## 2013-12-10 DIAGNOSIS — G2581 Restless legs syndrome: Secondary | ICD-10-CM | POA: Diagnosis not present

## 2013-12-10 DIAGNOSIS — F0391 Unspecified dementia with behavioral disturbance: Secondary | ICD-10-CM | POA: Diagnosis not present

## 2013-12-10 DIAGNOSIS — S22018D Other fracture of first thoracic vertebra, subsequent encounter for fracture with routine healing: Secondary | ICD-10-CM | POA: Diagnosis not present

## 2013-12-13 DIAGNOSIS — G2581 Restless legs syndrome: Secondary | ICD-10-CM | POA: Diagnosis not present

## 2013-12-13 DIAGNOSIS — S12690D Other displaced fracture of seventh cervical vertebra, subsequent encounter for fracture with routine healing: Secondary | ICD-10-CM | POA: Diagnosis not present

## 2013-12-13 DIAGNOSIS — I2510 Atherosclerotic heart disease of native coronary artery without angina pectoris: Secondary | ICD-10-CM | POA: Diagnosis not present

## 2013-12-13 DIAGNOSIS — F0391 Unspecified dementia with behavioral disturbance: Secondary | ICD-10-CM | POA: Diagnosis not present

## 2013-12-13 DIAGNOSIS — S22018D Other fracture of first thoracic vertebra, subsequent encounter for fracture with routine healing: Secondary | ICD-10-CM | POA: Diagnosis not present

## 2013-12-13 DIAGNOSIS — I10 Essential (primary) hypertension: Secondary | ICD-10-CM | POA: Diagnosis not present

## 2013-12-14 DIAGNOSIS — I2510 Atherosclerotic heart disease of native coronary artery without angina pectoris: Secondary | ICD-10-CM | POA: Diagnosis not present

## 2013-12-14 DIAGNOSIS — F0391 Unspecified dementia with behavioral disturbance: Secondary | ICD-10-CM | POA: Diagnosis not present

## 2013-12-14 DIAGNOSIS — S12690D Other displaced fracture of seventh cervical vertebra, subsequent encounter for fracture with routine healing: Secondary | ICD-10-CM | POA: Diagnosis not present

## 2013-12-14 DIAGNOSIS — S22018D Other fracture of first thoracic vertebra, subsequent encounter for fracture with routine healing: Secondary | ICD-10-CM | POA: Diagnosis not present

## 2013-12-14 DIAGNOSIS — G2581 Restless legs syndrome: Secondary | ICD-10-CM | POA: Diagnosis not present

## 2013-12-14 DIAGNOSIS — I10 Essential (primary) hypertension: Secondary | ICD-10-CM | POA: Diagnosis not present

## 2013-12-15 DIAGNOSIS — S22018D Other fracture of first thoracic vertebra, subsequent encounter for fracture with routine healing: Secondary | ICD-10-CM | POA: Diagnosis not present

## 2013-12-15 DIAGNOSIS — F0391 Unspecified dementia with behavioral disturbance: Secondary | ICD-10-CM | POA: Diagnosis not present

## 2013-12-15 DIAGNOSIS — S12690D Other displaced fracture of seventh cervical vertebra, subsequent encounter for fracture with routine healing: Secondary | ICD-10-CM | POA: Diagnosis not present

## 2013-12-15 DIAGNOSIS — I2510 Atherosclerotic heart disease of native coronary artery without angina pectoris: Secondary | ICD-10-CM | POA: Diagnosis not present

## 2013-12-15 DIAGNOSIS — I10 Essential (primary) hypertension: Secondary | ICD-10-CM | POA: Diagnosis not present

## 2013-12-15 DIAGNOSIS — G2581 Restless legs syndrome: Secondary | ICD-10-CM | POA: Diagnosis not present

## 2013-12-16 DIAGNOSIS — I10 Essential (primary) hypertension: Secondary | ICD-10-CM | POA: Diagnosis not present

## 2013-12-16 DIAGNOSIS — I2510 Atherosclerotic heart disease of native coronary artery without angina pectoris: Secondary | ICD-10-CM | POA: Diagnosis not present

## 2013-12-16 DIAGNOSIS — F0391 Unspecified dementia with behavioral disturbance: Secondary | ICD-10-CM | POA: Diagnosis not present

## 2013-12-16 DIAGNOSIS — S12690D Other displaced fracture of seventh cervical vertebra, subsequent encounter for fracture with routine healing: Secondary | ICD-10-CM | POA: Diagnosis not present

## 2013-12-16 DIAGNOSIS — G2581 Restless legs syndrome: Secondary | ICD-10-CM | POA: Diagnosis not present

## 2013-12-16 DIAGNOSIS — S22018D Other fracture of first thoracic vertebra, subsequent encounter for fracture with routine healing: Secondary | ICD-10-CM | POA: Diagnosis not present

## 2013-12-20 DIAGNOSIS — I2510 Atherosclerotic heart disease of native coronary artery without angina pectoris: Secondary | ICD-10-CM | POA: Diagnosis not present

## 2013-12-20 DIAGNOSIS — G2581 Restless legs syndrome: Secondary | ICD-10-CM | POA: Diagnosis not present

## 2013-12-20 DIAGNOSIS — S12690D Other displaced fracture of seventh cervical vertebra, subsequent encounter for fracture with routine healing: Secondary | ICD-10-CM | POA: Diagnosis not present

## 2013-12-20 DIAGNOSIS — F0391 Unspecified dementia with behavioral disturbance: Secondary | ICD-10-CM | POA: Diagnosis not present

## 2013-12-20 DIAGNOSIS — I10 Essential (primary) hypertension: Secondary | ICD-10-CM | POA: Diagnosis not present

## 2013-12-20 DIAGNOSIS — S22018D Other fracture of first thoracic vertebra, subsequent encounter for fracture with routine healing: Secondary | ICD-10-CM | POA: Diagnosis not present

## 2013-12-21 DIAGNOSIS — S22018D Other fracture of first thoracic vertebra, subsequent encounter for fracture with routine healing: Secondary | ICD-10-CM | POA: Diagnosis not present

## 2013-12-21 DIAGNOSIS — I2510 Atherosclerotic heart disease of native coronary artery without angina pectoris: Secondary | ICD-10-CM | POA: Diagnosis not present

## 2013-12-21 DIAGNOSIS — S12690D Other displaced fracture of seventh cervical vertebra, subsequent encounter for fracture with routine healing: Secondary | ICD-10-CM | POA: Diagnosis not present

## 2013-12-21 DIAGNOSIS — F0391 Unspecified dementia with behavioral disturbance: Secondary | ICD-10-CM | POA: Diagnosis not present

## 2013-12-21 DIAGNOSIS — G2581 Restless legs syndrome: Secondary | ICD-10-CM | POA: Diagnosis not present

## 2013-12-21 DIAGNOSIS — I10 Essential (primary) hypertension: Secondary | ICD-10-CM | POA: Diagnosis not present

## 2013-12-22 DIAGNOSIS — F0391 Unspecified dementia with behavioral disturbance: Secondary | ICD-10-CM | POA: Diagnosis not present

## 2013-12-22 DIAGNOSIS — S22018D Other fracture of first thoracic vertebra, subsequent encounter for fracture with routine healing: Secondary | ICD-10-CM | POA: Diagnosis not present

## 2013-12-22 DIAGNOSIS — S12690D Other displaced fracture of seventh cervical vertebra, subsequent encounter for fracture with routine healing: Secondary | ICD-10-CM | POA: Diagnosis not present

## 2013-12-22 DIAGNOSIS — I2510 Atherosclerotic heart disease of native coronary artery without angina pectoris: Secondary | ICD-10-CM | POA: Diagnosis not present

## 2013-12-22 DIAGNOSIS — G2581 Restless legs syndrome: Secondary | ICD-10-CM | POA: Diagnosis not present

## 2013-12-22 DIAGNOSIS — I10 Essential (primary) hypertension: Secondary | ICD-10-CM | POA: Diagnosis not present

## 2013-12-23 DIAGNOSIS — S22018D Other fracture of first thoracic vertebra, subsequent encounter for fracture with routine healing: Secondary | ICD-10-CM | POA: Diagnosis not present

## 2013-12-23 DIAGNOSIS — F0391 Unspecified dementia with behavioral disturbance: Secondary | ICD-10-CM | POA: Diagnosis not present

## 2013-12-23 DIAGNOSIS — S12690D Other displaced fracture of seventh cervical vertebra, subsequent encounter for fracture with routine healing: Secondary | ICD-10-CM | POA: Diagnosis not present

## 2013-12-23 DIAGNOSIS — I10 Essential (primary) hypertension: Secondary | ICD-10-CM | POA: Diagnosis not present

## 2013-12-23 DIAGNOSIS — I2510 Atherosclerotic heart disease of native coronary artery without angina pectoris: Secondary | ICD-10-CM | POA: Diagnosis not present

## 2013-12-23 DIAGNOSIS — G2581 Restless legs syndrome: Secondary | ICD-10-CM | POA: Diagnosis not present

## 2013-12-27 DIAGNOSIS — Z6828 Body mass index (BMI) 28.0-28.9, adult: Secondary | ICD-10-CM | POA: Diagnosis not present

## 2013-12-27 DIAGNOSIS — I10 Essential (primary) hypertension: Secondary | ICD-10-CM | POA: Diagnosis not present

## 2013-12-27 DIAGNOSIS — F0391 Unspecified dementia with behavioral disturbance: Secondary | ICD-10-CM | POA: Diagnosis not present

## 2013-12-27 DIAGNOSIS — I2510 Atherosclerotic heart disease of native coronary artery without angina pectoris: Secondary | ICD-10-CM | POA: Diagnosis not present

## 2013-12-27 DIAGNOSIS — S22018D Other fracture of first thoracic vertebra, subsequent encounter for fracture with routine healing: Secondary | ICD-10-CM | POA: Diagnosis not present

## 2013-12-27 DIAGNOSIS — G2581 Restless legs syndrome: Secondary | ICD-10-CM | POA: Diagnosis not present

## 2013-12-27 DIAGNOSIS — S12690G Other displaced fracture of seventh cervical vertebra, subsequent encounter for fracture with delayed healing: Secondary | ICD-10-CM | POA: Diagnosis not present

## 2013-12-27 DIAGNOSIS — S12690D Other displaced fracture of seventh cervical vertebra, subsequent encounter for fracture with routine healing: Secondary | ICD-10-CM | POA: Diagnosis not present

## 2013-12-28 ENCOUNTER — Other Ambulatory Visit: Payer: Self-pay | Admitting: Family Medicine

## 2013-12-28 DIAGNOSIS — F0391 Unspecified dementia with behavioral disturbance: Secondary | ICD-10-CM | POA: Diagnosis not present

## 2013-12-28 DIAGNOSIS — S12690D Other displaced fracture of seventh cervical vertebra, subsequent encounter for fracture with routine healing: Secondary | ICD-10-CM | POA: Diagnosis not present

## 2013-12-28 DIAGNOSIS — I2510 Atherosclerotic heart disease of native coronary artery without angina pectoris: Secondary | ICD-10-CM | POA: Diagnosis not present

## 2013-12-28 DIAGNOSIS — G2581 Restless legs syndrome: Secondary | ICD-10-CM | POA: Diagnosis not present

## 2013-12-28 DIAGNOSIS — S22018D Other fracture of first thoracic vertebra, subsequent encounter for fracture with routine healing: Secondary | ICD-10-CM | POA: Diagnosis not present

## 2013-12-28 DIAGNOSIS — I10 Essential (primary) hypertension: Secondary | ICD-10-CM | POA: Diagnosis not present

## 2013-12-29 DIAGNOSIS — I2510 Atherosclerotic heart disease of native coronary artery without angina pectoris: Secondary | ICD-10-CM | POA: Diagnosis not present

## 2013-12-29 DIAGNOSIS — S22018D Other fracture of first thoracic vertebra, subsequent encounter for fracture with routine healing: Secondary | ICD-10-CM | POA: Diagnosis not present

## 2013-12-29 DIAGNOSIS — F0391 Unspecified dementia with behavioral disturbance: Secondary | ICD-10-CM | POA: Diagnosis not present

## 2013-12-29 DIAGNOSIS — I10 Essential (primary) hypertension: Secondary | ICD-10-CM | POA: Diagnosis not present

## 2013-12-29 DIAGNOSIS — S12690D Other displaced fracture of seventh cervical vertebra, subsequent encounter for fracture with routine healing: Secondary | ICD-10-CM | POA: Diagnosis not present

## 2013-12-29 DIAGNOSIS — G2581 Restless legs syndrome: Secondary | ICD-10-CM | POA: Diagnosis not present

## 2013-12-30 DIAGNOSIS — S12690D Other displaced fracture of seventh cervical vertebra, subsequent encounter for fracture with routine healing: Secondary | ICD-10-CM | POA: Diagnosis not present

## 2013-12-30 DIAGNOSIS — I2510 Atherosclerotic heart disease of native coronary artery without angina pectoris: Secondary | ICD-10-CM | POA: Diagnosis not present

## 2013-12-30 DIAGNOSIS — F0391 Unspecified dementia with behavioral disturbance: Secondary | ICD-10-CM | POA: Diagnosis not present

## 2013-12-30 DIAGNOSIS — G2581 Restless legs syndrome: Secondary | ICD-10-CM | POA: Diagnosis not present

## 2013-12-30 DIAGNOSIS — S22018D Other fracture of first thoracic vertebra, subsequent encounter for fracture with routine healing: Secondary | ICD-10-CM | POA: Diagnosis not present

## 2013-12-30 DIAGNOSIS — I10 Essential (primary) hypertension: Secondary | ICD-10-CM | POA: Diagnosis not present

## 2014-01-05 DIAGNOSIS — I2510 Atherosclerotic heart disease of native coronary artery without angina pectoris: Secondary | ICD-10-CM | POA: Diagnosis not present

## 2014-01-05 DIAGNOSIS — I10 Essential (primary) hypertension: Secondary | ICD-10-CM | POA: Diagnosis not present

## 2014-01-05 DIAGNOSIS — F0391 Unspecified dementia with behavioral disturbance: Secondary | ICD-10-CM | POA: Diagnosis not present

## 2014-01-05 DIAGNOSIS — S12690D Other displaced fracture of seventh cervical vertebra, subsequent encounter for fracture with routine healing: Secondary | ICD-10-CM | POA: Diagnosis not present

## 2014-01-05 DIAGNOSIS — G2581 Restless legs syndrome: Secondary | ICD-10-CM | POA: Diagnosis not present

## 2014-01-05 DIAGNOSIS — S22018D Other fracture of first thoracic vertebra, subsequent encounter for fracture with routine healing: Secondary | ICD-10-CM | POA: Diagnosis not present

## 2014-01-12 DIAGNOSIS — S22018D Other fracture of first thoracic vertebra, subsequent encounter for fracture with routine healing: Secondary | ICD-10-CM | POA: Diagnosis not present

## 2014-01-12 DIAGNOSIS — G2581 Restless legs syndrome: Secondary | ICD-10-CM | POA: Diagnosis not present

## 2014-01-12 DIAGNOSIS — S12690D Other displaced fracture of seventh cervical vertebra, subsequent encounter for fracture with routine healing: Secondary | ICD-10-CM | POA: Diagnosis not present

## 2014-01-12 DIAGNOSIS — I10 Essential (primary) hypertension: Secondary | ICD-10-CM | POA: Diagnosis not present

## 2014-01-12 DIAGNOSIS — I2510 Atherosclerotic heart disease of native coronary artery without angina pectoris: Secondary | ICD-10-CM | POA: Diagnosis not present

## 2014-01-12 DIAGNOSIS — F0391 Unspecified dementia with behavioral disturbance: Secondary | ICD-10-CM | POA: Diagnosis not present

## 2014-02-14 ENCOUNTER — Telehealth: Payer: Self-pay | Admitting: Neurology

## 2014-02-14 ENCOUNTER — Ambulatory Visit: Payer: Medicare Other | Admitting: Neurology

## 2014-02-14 ENCOUNTER — Encounter: Payer: Self-pay | Admitting: Neurology

## 2014-02-14 NOTE — Telephone Encounter (Signed)
This patient did not show for a revisit appointment today. 

## 2014-02-23 ENCOUNTER — Other Ambulatory Visit: Payer: Self-pay | Admitting: Cardiology

## 2014-02-23 ENCOUNTER — Other Ambulatory Visit: Payer: Self-pay | Admitting: Family Medicine

## 2014-02-23 DIAGNOSIS — S12690G Other displaced fracture of seventh cervical vertebra, subsequent encounter for fracture with delayed healing: Secondary | ICD-10-CM | POA: Diagnosis not present

## 2014-02-23 DIAGNOSIS — I10 Essential (primary) hypertension: Secondary | ICD-10-CM | POA: Diagnosis not present

## 2014-02-23 DIAGNOSIS — M542 Cervicalgia: Secondary | ICD-10-CM | POA: Diagnosis not present

## 2014-02-23 DIAGNOSIS — Z6827 Body mass index (BMI) 27.0-27.9, adult: Secondary | ICD-10-CM | POA: Diagnosis not present

## 2014-03-08 ENCOUNTER — Ambulatory Visit: Payer: Medicare Other | Attending: Neurosurgery | Admitting: Physical Therapy

## 2014-03-08 ENCOUNTER — Encounter: Payer: Self-pay | Admitting: Physical Therapy

## 2014-03-08 DIAGNOSIS — I10 Essential (primary) hypertension: Secondary | ICD-10-CM | POA: Diagnosis not present

## 2014-03-08 DIAGNOSIS — M6281 Muscle weakness (generalized): Secondary | ICD-10-CM | POA: Diagnosis not present

## 2014-03-08 DIAGNOSIS — M5382 Other specified dorsopathies, cervical region: Secondary | ICD-10-CM

## 2014-03-08 DIAGNOSIS — M8580 Other specified disorders of bone density and structure, unspecified site: Secondary | ICD-10-CM | POA: Insufficient documentation

## 2014-03-08 DIAGNOSIS — M797 Fibromyalgia: Secondary | ICD-10-CM | POA: Diagnosis not present

## 2014-03-08 DIAGNOSIS — G4733 Obstructive sleep apnea (adult) (pediatric): Secondary | ICD-10-CM | POA: Diagnosis not present

## 2014-03-08 DIAGNOSIS — G2581 Restless legs syndrome: Secondary | ICD-10-CM | POA: Diagnosis not present

## 2014-03-08 DIAGNOSIS — E669 Obesity, unspecified: Secondary | ICD-10-CM | POA: Insufficient documentation

## 2014-03-08 DIAGNOSIS — E039 Hypothyroidism, unspecified: Secondary | ICD-10-CM | POA: Insufficient documentation

## 2014-03-08 DIAGNOSIS — M542 Cervicalgia: Secondary | ICD-10-CM | POA: Diagnosis not present

## 2014-03-08 DIAGNOSIS — K589 Irritable bowel syndrome without diarrhea: Secondary | ICD-10-CM | POA: Diagnosis not present

## 2014-03-08 DIAGNOSIS — E785 Hyperlipidemia, unspecified: Secondary | ICD-10-CM | POA: Diagnosis not present

## 2014-03-08 DIAGNOSIS — K5090 Crohn's disease, unspecified, without complications: Secondary | ICD-10-CM | POA: Diagnosis not present

## 2014-03-08 DIAGNOSIS — K219 Gastro-esophageal reflux disease without esophagitis: Secondary | ICD-10-CM | POA: Insufficient documentation

## 2014-03-08 DIAGNOSIS — F0390 Unspecified dementia without behavioral disturbance: Secondary | ICD-10-CM | POA: Diagnosis not present

## 2014-03-08 DIAGNOSIS — I2510 Atherosclerotic heart disease of native coronary artery without angina pectoris: Secondary | ICD-10-CM | POA: Diagnosis not present

## 2014-03-08 NOTE — Therapy (Signed)
[facility] [address] [place], [place], [address] Phone: [phone]   Fax:  [phone]  Physical Therapy Evaluation  Patient Details  Name: [name] MRN: [id number] Date of Birth: 02-27-1932 Referring Provider:  [name], MD  Encounter Date: 03/08/2014      PT End of Session - 03/08/14 0845    Visit Number 1   Number of Visits 16   Date for PT Re-Evaluation 05/03/14   PT Start Time 0800   PT Stop Time 0845   PT Time Calculation (min) 45 min   Activity Tolerance Patient tolerated treatment well   Behavior During Therapy [facility] for tasks assessed/performed      Past Medical History  Diagnosis Date  . OSA (obstructive sleep apnea)   . History of bronchitis   . Other diseases of lung, not elsewhere classified   . Hypertension   . CAD (coronary artery disease)     LAD 30% 2006  . History of atrial flutter   . Cerebrovascular disease   . Hyperlipemia   . Obesity   . GERD (gastroesophageal reflux disease)   . Diverticulosis of colon   . IBS (irritable bowel syndrome)   . Colonic polyp   . Crohn's disease   . Hypothyroidism   . History of hyperparathyroidism   . Fibromyalgia   . Osteopenia   . History of headache   . Syncope   . Restless legs syndrome (RLS)   . Psychosis   . History of tuberculosis   . Radiation 05/2008    5000 cGy to left lower eyelid, parotid lymphatics and upper neck  . Memory disturbance   . Bipolar affective     History of psychosis  . Merkel cell tumor     Left eyelid    Past Surgical History  Procedure Laterality Date  . Parathyroid adenoma surgery    . Cholecystectomy    . Moh';s surg left lower eye lid surgery and lid reconstruction surgery  2/10  . Bladder suspension    . Cataract extraction Bilateral   . Appendectomy    . Tonsillectomy and adenoidectomy    . Lumbar laminectomy    . Incision and drainage perirectal abscess    . Heel spur excision    .  Nasal sinus surgery Right     Right maxillary  . Eye lid surgery  04/04/2011    [facility] by Dr. Norlene Duel  . Posterior cervical fusion/foraminotomy N/A 10/12/2013    Procedure: Cervical five-Thoracic two cervico-thoracic posterior fusion;  Surgeon: [name], MD;  Location: [place] [facility];  Service: Neurosurgery;  Laterality: N/A;    There were no vitals taken for this visit.  Visit Diagnosis:  Neck muscle weakness - Plan: PT plan of care cert/re-cert  Cervical pain (neck) - Plan: PT plan of care cert/re-cert      Subjective Assessment - 03/08/14 0814    Symptoms Patient reports cervical pain due to a spinal fracture that occured 10/2013. Patient reports fracture from a fall due to vertigo. Patient has trouble holding her head up. No numbness or tingling in arms.    Pertinent History Patient has broken her lumbatr spine. No surgery to spine.   Limitations Reading   Patient Stated Goals Reduce pain in cervical   Currently in Pain? Yes   Pain Score 2    Pain Location Neck   Pain Orientation Posterior   Pain Descriptors / Indicators Aching;Constant   Pain Type Chronic pain  Pain Onset More than a month ago   Pain Frequency Constant   Aggravating Factors  movement of the neck   Pain Relieving Factors heating pad, laying down, pain relieving cream   Effect of Pain on Daily Activities holding head upward   Multiple Pain Sites No          [facility] PT Assessment - 03/08/14 0001    Assessment   Medical Diagnosis Cervicalgia   Onset Date 10/08/14   Next MD Visit Not sure   Prior Therapy None   Precautions   Precautions Cervical   Precaution Comments No joint mobization    Balance Screen   Has the patient fallen in the past 6 months Yes  Had balance physical therapy. still doing exercises   How many times? fell 2 times when she broke her back and cervical   Has the patient had a decrease in activity level because of a fear of falling?  No   Is the patient reluctant to leave their  home because of a fear of falling?  No   Home Environment   Living Enviornment Private residence   Living Arrangements Spouse/significant other   Type of [place] living facility   Prior Function   Level of Independence Independent with basic ADLs;Independent with homemaking with ambulation   Vocation Retired   Observation/Other Assessments   Focus on Therapeutic Outcomes (FOTO)  39% limitation   Posture/Postural Control   Posture/Postural Control Postural limitations   Postural Limitations Rounded Shoulders;Forward head;Decreased lumbar lordosis   Posture Comments Head is forward due to cervical weakness   AROM   Cervical Flexion full with pulling   Cervical Extension 100% limitation with pulling   Cervical - Right Side Bend decreased by 25%   Cervical - Left Side Bend decreased by 50%   Cervical - Right Rotation decreased by 50%   Cervical - Left Rotation decreased by 50%   Strength   Right/Left Shoulder --  Bilateral shoulder 4/5   Cervical Flexion 5/5   Cervical Extension 3/5   Cervical - Right Side Bend 4/5   Cervical - Left Side Bend 4/5   Cervical - Right Rotation 4/5   Cervical - Left Rotation 4/5   Palpation   Palpation tender on left cervical thoracic area   Ambulation/Gait   Ambulation/Gait Yes   Ambulation/Gait Assistance 7: Independent   Gait Pattern Decreased stride length   Balance   Balance Assessed Yes   Standardized Balance Assessment   Standardized Balance Assessment Timed Up and Go Test   Timed Up and Go Test   TUG Normal TUG   Normal TUG (seconds) 19                          PT Education - 03/08/14 0845    Education provided Yes   Education Details tips to avoid falls, cervical retraction, pectoralis stretch   Person(s) Educated Patient   Methods Explanation;Demonstration;Verbal cues   Comprehension Verbalized understanding;Returned demonstration          PT Short Term Goals - 03/08/14 0946    PT SHORT TERM GOAL  #1   Title understand correct posture in sitting and standing   Time 4   Period Weeks   Status New   PT SHORT TERM GOAL #2   Title understand correct HEP for cervical isometrics and low level shoulder exercises   Time 4   Period Weeks   Status New   PT  SHORT TERM GOAL #3   Title cervical pain decreased >/= 25%with daily acitivites   Period Weeks   Status New           PT Long Term Goals - 04-06-2014 0947    PT LONG TERM GOAL #1   Title Cervical pain with daily activities decreased >/= 50%   Time 8   Period Weeks   Status New   PT LONG TERM GOAL #2   Title increased shoulder strength >/= 4+/5 making it easier to perfrom daily acitivites   Time 8   Period Weeks   Status New   PT LONG TERM GOAL #3   Title hold her head up for 5 minutes due to increased cervical strength   Time 8   Period Weeks   Status New   PT LONG TERM GOAL #4   Title TUG score </= 13 seconds   Time 8   Period Weeks   Status New               Plan - April 06, 2014 0944    Clinical Impression Statement Patient has weak neck musculature.  Patient has difficulty holding her head up in sitting and standing due to pain and musculature weakness.    Pt will benefit from skilled therapeutic intervention in order to improve on the following deficits Decreased range of motion;Impaired flexibility;Improper body mechanics;Postural dysfunction;Decreased endurance;Decreased activity tolerance;Increased fascial restricitons;Pain;Increased muscle spasms;Decreased balance;Decreased mobility;Decreased strength   Rehab Potential Good   Clinical Impairments Affecting Rehab Potential None   PT Frequency 2x / week   PT Duration 8 weeks   PT Treatment/Interventions Moist Heat;Therapeutic activities;Patient/family education;Passive range of motion;Therapeutic exercise;Ultrasound;Balance training;Gait training;Manual techniques;Neuromuscular re-education;Functional mobility training   PT Next Visit Plan Cervical isometrics,  posture education, modalities as needed, soft tissue work to cervical, shoulder strengthening   PT Home Exercise Plan Cervical isometrics.   Recommended Other Services None   Consulted and Agree with Plan of Care Patient          G-Codes - 04-06-2014 0846    Functional Assessment Tool Used FOTO score is 39% limitation   Functional Limitation Carrying, moving and handling objects   Carrying, Moving and Handling Objects Current Status [phone]) At least 20 percent but less than 40 percent impaired, limited or restricted   Carrying, Moving and Handling Objects Goal Status (H2992) At least 20 percent but less than 40 percent impaired, limited or restricted  goal is 37% limitation        Problem List Patient Active Problem List   Diagnosis Date Noted  . Allergic rhinitis 11/04/2013  . Insomnia 11/04/2013  . Senile dementia 10/22/2013  . C7 cervical fracture 10/12/2013  . Obesity (BMI 30-39.9) 04/01/2013  . Osteoarthritis 12/16/2012  . Memory impairment 01/15/2012  . Low back pain 02/13/2011  . Low back strain 02/13/2011  . MERKEL CELL TUMOR 12/14/2009  . OTHER DISEASES OF LUNG NOT ELSEWHERE CLASSIFIED 12/14/2009  . RESTLESS LEG SYNDROME 10/16/2008  . PSYCHOSIS 12/15/2007  . Regional enteritis 12/15/2007  . CEREBROVASCULAR DISEASE 08/12/2007  . COLONIC POLYPS 04/29/2007  . Hypothyroidism 04/29/2007  . BRONCHITIS, RECURRENT 04/29/2007  . DIVERTICULOSIS OF COLON 04/29/2007  . IRRITABLE BOWEL SYNDROME 04/29/2007  . SYNCOPE 04/28/2007  . OBESITY 01/29/2007  . Coronary atherosclerosis 01/29/2007  . ATRIAL FLUTTER 01/29/2007  . GASTROESOPHAGEAL REFLUX DISEASE 01/29/2007  . OSTEOPENIA 01/29/2007  . HEADACHE, MIXED 01/29/2007  . Hyperlipidemia 01/12/2007  . OBSTRUCTIVE SLEEP APNEA 01/12/2007  . Essential hypertension 01/12/2007  . FIBROMYALGIA  01/12/2007  . HYPERPARATHYROIDISM, HX OF 10/21/2006    GRAY,CHERYL, PT 03/08/2014, 9:53 AM  [facility] [address] [place], [place], [address] Phone: [phone]   Fax:  [phone]

## 2014-03-08 NOTE — Patient Instructions (Signed)
Chin Protraction / Retraction   Slide head forward keeping chin level. Slide head back, pulling chin in. Hold each position _5__ seconds. Repeat _3__ times. Do __4_ sessions per day. Copyright  VHI. All rights reserved.  Scapula Adduction With Pectoralis Stretch: High - Standing   Shoulders at 120, keeping weight on feet, lean forward and squeeze shoulder blades together. Hold _15__ seconds. Do _2__ times, 1___ times per day.  http://ss.exer.us/243   Copyright  VHI. All rights reserved.  Preventing Falls and Fractures  Falls can be very serious, especially for older adults or people with osteoporosis  Falls can be caused by:  Tripping or slipping  Slow reflexes  Balance problems  Reduced muscle strength  Poor vision or a recent change in prescription  Illness and some medications (especially blood pressure pills, diuretics, heart medicines, muscle relaxants and sleep medications)  Drinking alcohol  To prevent falls outdoors:  Use a can or walker if needed  Wear rubber-soled shoes so you don't slip  DO NOT buy "shape up" shoes with rocker bottom soles if you have balance problems.  The thick soles and shape make it more difficult to keep your balance.  Put kitty litter or salt on icy sidewalks  Walk on the grass if the sidewalks are slick  Avoid walking on uneven ground whenever possible  T prevent falls indoors:  Keep rooms clutter-free, especially hallways, stairs and paths to light switches  Remove throw rugs  Install night lights, especially to and in the bathroom  Turn on lights before going downstairs  Keep a flashlight next to your bed  Buy a cordless phone to keep with you instead of jumping up to answer the phone  Install grab bars in the bathroom near the shower and toilet  Install rails on both sides of the stairs.  Make sure the stairs are well lit  Wear slippers with non-skid soles.  Do not walk around in stockings or socks  Balance  problems and dizziness are not a normal part of growing older.  If you begin having balance problems or dizziness see your doctor.  Physical Therapy can help you with many balance problems, strengthening hip and leg muscles and with gait training.  To keep your bones healthy make sure you are getting enough calcium and Vitamin D each day.  Ask your doctor or pharmacist about supplements.  Regular weight-bearing exercise like walking, lifting weights or dancing can help strengthen bones and prevent osteoporosis.  Marland Kitchen

## 2014-03-11 ENCOUNTER — Encounter: Payer: Medicare Other | Admitting: Physical Therapy

## 2014-03-15 ENCOUNTER — Ambulatory Visit: Payer: Medicare Other | Admitting: Physical Therapy

## 2014-03-15 ENCOUNTER — Telehealth: Payer: Self-pay

## 2014-03-15 NOTE — Telephone Encounter (Signed)
Pt no-showed called to check on the patient and she stated she forgot about her appt.

## 2014-03-17 ENCOUNTER — Encounter: Payer: Self-pay | Admitting: Physical Therapy

## 2014-03-17 ENCOUNTER — Ambulatory Visit: Payer: Medicare Other | Admitting: Physical Therapy

## 2014-03-17 DIAGNOSIS — M5382 Other specified dorsopathies, cervical region: Secondary | ICD-10-CM

## 2014-03-17 DIAGNOSIS — E785 Hyperlipidemia, unspecified: Secondary | ICD-10-CM | POA: Diagnosis not present

## 2014-03-17 DIAGNOSIS — I2510 Atherosclerotic heart disease of native coronary artery without angina pectoris: Secondary | ICD-10-CM | POA: Diagnosis not present

## 2014-03-17 DIAGNOSIS — M6281 Muscle weakness (generalized): Secondary | ICD-10-CM | POA: Diagnosis not present

## 2014-03-17 DIAGNOSIS — G4733 Obstructive sleep apnea (adult) (pediatric): Secondary | ICD-10-CM | POA: Diagnosis not present

## 2014-03-17 DIAGNOSIS — I10 Essential (primary) hypertension: Secondary | ICD-10-CM | POA: Diagnosis not present

## 2014-03-17 DIAGNOSIS — M542 Cervicalgia: Secondary | ICD-10-CM

## 2014-03-17 NOTE — Therapy (Signed)
[facility] [address] [place], [place], [address] Phone: [phone]   Fax:  [phone]  Physical Therapy Treatment  Patient Details  Name: [name] MRN: [id number] Date of Birth: 01-Jan-1933 Referring Provider:  [name], MD  Encounter Date: 03/17/2014      PT End of Session - 03/17/14 1137    Visit Number 2   Number of Visits --  10 medicare   Date for PT Re-Evaluation 05/03/14   PT Start Time 1100   PT Stop Time 1200   PT Time Calculation (min) 60 min   Activity Tolerance Patient tolerated treatment well   Behavior During Therapy [facility] for tasks assessed/performed      Past Medical History  Diagnosis Date  . OSA (obstructive sleep apnea)   . History of bronchitis   . Other diseases of lung, not elsewhere classified   . Hypertension   . CAD (coronary artery disease)     LAD 30% 2006  . History of atrial flutter   . Cerebrovascular disease   . Hyperlipemia   . Obesity   . GERD (gastroesophageal reflux disease)   . Diverticulosis of colon   . IBS (irritable bowel syndrome)   . Colonic polyp   . Crohn's disease   . Hypothyroidism   . History of hyperparathyroidism   . Fibromyalgia   . Osteopenia   . History of headache   . Syncope   . Restless legs syndrome (RLS)   . Psychosis   . History of tuberculosis   . Radiation 05/2008    5000 cGy to left lower eyelid, parotid lymphatics and upper neck  . Memory disturbance   . Bipolar affective     History of psychosis  . Merkel cell tumor     Left eyelid    Past Surgical History  Procedure Laterality Date  . Parathyroid adenoma surgery    . Cholecystectomy    . Moh';s surg left lower eye lid surgery and lid reconstruction surgery  2/10  . Bladder suspension    . Cataract extraction Bilateral   . Appendectomy    . Tonsillectomy and adenoidectomy    . Lumbar laminectomy    . Incision and drainage perirectal abscess    . Heel spur  excision    . Nasal sinus surgery Right     Right maxillary  . Eye lid surgery  04/04/2011    [facility] by Dr. Norlene Duel  . Posterior cervical fusion/foraminotomy N/A 10/12/2013    Procedure: Cervical five-Thoracic two cervico-thoracic posterior fusion;  Surgeon: [name], MD;  Location: [place] [facility];  Service: Neurosurgery;  Laterality: N/A;    There were no vitals taken for this visit.  Visit Diagnosis:  Neck muscle weakness  Cervical pain (neck)      Subjective Assessment - 03/17/14 1106    Symptoms Patient reports she felt fine after the evaluation.  Patient forgot about her last appointment.    Pertinent History Patient has broken her lumbar spine. No surgery to spine.   Limitations Reading   Patient Stated Goals Reduce pain in cervical   Currently in Pain? Yes   Pain Score 2    Pain Location Neck   Pain Orientation Posterior   Pain Descriptors / Indicators Aching;Constant   Pain Type Chronic pain   Pain Onset More than a month ago   Pain Frequency Constant   Aggravating Factors  movement   Pain Relieving Factors heating pad, laying down, pain  relieving cream   Effect of Pain on Daily Activities hold head upward   Multiple Pain Sites No      No changes in cervical range of motion and strength due to only having initial eval and 1 treatment.               [facility] PT Treatment/Exercise - 03/17/14 0001    Modalities   Modalities Electrical Stimulation;Moist Heat   Moist Heat Therapy   Number Minutes Moist Heat 20 Minutes   Moist Heat Location Other (comment)  cervical, supine   Electrical Stimulation   Electrical Stimulation Location cervical   Electrical Stimulation Action IFC   Electrical Stimulation Parameters 20 min   Electrical Stimulation Goals Pain   Manual Therapy   Manual Therapy Massage   Massage soft tissue work to cervical musculature, SCM, anterior cervical, upper trapezius                PT Education - 03/17/14 1117     Education provided Yes   Education Details Cervical isometric   Person(s) Educated Patient   Methods Explanation;Demonstration;Handout;Verbal cues   Comprehension Verbalized understanding;Returned demonstration;Verbal cues required          PT Short Term Goals - 03/17/14 1140    PT SHORT TERM GOAL #1   Title understand correct posture in sitting and standing   Time 4   Period Weeks   Status New  just started therapy   PT SHORT TERM GOAL #2   Title understand correct HEP for cervical isometrics and low level shoulder exercises   Time 4   Period Weeks   Status On-going  learned cervical isometrics   PT SHORT TERM GOAL #3   Title cervical pain decreased >/= 25%with daily acitivites   Time 4   Period Weeks   Status New  no change in pain due to just starting therapy           PT Long Term Goals - 03/17/14 1141    PT LONG TERM GOAL #1   Title Cervical pain with daily activities decreased >/= 50%   Time 8   Period Weeks   Status New  due to just starting therapy   PT LONG TERM GOAL #2   Title increased shoulder strength >/= 4+/5 making it easier to perfrom daily acitivites   Time 8   Period Weeks   Status New  has not learned shoulder exercises yet   PT LONG TERM GOAL #3   Title hold her head up for 5 minutes due to increased cervical strength   Time 8   Period Weeks   Status New  learning cervical exercises   PT LONG TERM GOAL #4   Title TUG score </= 13 seconds   Time 8   Period Weeks   Status New  just started therapy               Plan - 03/17/14 1138    Clinical Impression Statement Patient has weak cervical musculature making it difficult ot hold her head up.  Patient has trigger points in cervical musculature.   Pt will benefit from skilled therapeutic intervention in order to improve on the following deficits Decreased range of motion;Impaired flexibility;Improper body mechanics;Postural dysfunction;Decreased endurance;Decreased activity  tolerance;Increased fascial restricitons;Pain;Increased muscle spasms;Decreased balance;Decreased mobility;Decreased strength   Rehab Potential Good   Clinical Impairments Affecting Rehab Potential None   PT Frequency 2x / week   PT Duration 8 weeks   PT Treatment/Interventions  Moist Heat;Therapeutic activities;Patient/family education;Passive range of motion;Therapeutic exercise;Ultrasound;Balance training;Gait training;Manual techniques;Neuromuscular re-education;Functional mobility training   PT Next Visit Plan soft tissue work, cervical retraction in supine, scapular strength in supine with head press.   PT Home Exercise Plan education on posture   Recommended Other Services None   Consulted and Agree with Plan of Care Patient        Problem List Patient Active Problem List   Diagnosis Date Noted  . Allergic rhinitis 11/04/2013  . Insomnia 11/04/2013  . Senile dementia 10/22/2013  . C7 cervical fracture 10/12/2013  . Obesity (BMI 30-39.9) 04/01/2013  . Osteoarthritis 12/16/2012  . Memory impairment 01/15/2012  . Low back pain 02/13/2011  . Low back strain 02/13/2011  . MERKEL CELL TUMOR 12/14/2009  . OTHER DISEASES OF LUNG NOT ELSEWHERE CLASSIFIED 12/14/2009  . RESTLESS LEG SYNDROME 10/16/2008  . PSYCHOSIS 12/15/2007  . Regional enteritis 12/15/2007  . CEREBROVASCULAR DISEASE 08/12/2007  . COLONIC POLYPS 04/29/2007  . Hypothyroidism 04/29/2007  . BRONCHITIS, RECURRENT 04/29/2007  . DIVERTICULOSIS OF COLON 04/29/2007  . IRRITABLE BOWEL SYNDROME 04/29/2007  . SYNCOPE 04/28/2007  . OBESITY 01/29/2007  . Coronary atherosclerosis 01/29/2007  . ATRIAL FLUTTER 01/29/2007  . GASTROESOPHAGEAL REFLUX DISEASE 01/29/2007  . OSTEOPENIA 01/29/2007  . HEADACHE, MIXED 01/29/2007  . Hyperlipidemia 01/12/2007  . OBSTRUCTIVE SLEEP APNEA 01/12/2007  . Essential hypertension 01/12/2007  . FIBROMYALGIA 01/12/2007  . HYPERPARATHYROIDISM, HX OF 10/21/2006    Ramia Sidney,  PT 03/17/2014, 11:45 AM  [facility] [address] [place], [place], [address] Phone: [phone]   Fax:  [phone]

## 2014-03-17 NOTE — Patient Instructions (Signed)
Strengthening: Flexion - Resisted   Facing forward, fingertips on forehead, bend head forward. Give light resistance. Hold 5 sec Repeat _3___ times per set. Do ___1_ sets per session. Do 2____ sessions per day.  http://orth.exer.us/328   Copyright  VHI. All rights reserved.  Strengthening: Extension - Resisted   Facing forward, fingertips on back of head, push head backwards. Do not bend head back. Give light resistance. Hold 5 sec.  Repeat __3__ times per set. Do __1__ sets per session. Do 2____ sessions per day.  http://orth.exer.us/330   Copyright  VHI. All rights reserved.  Strengthening: Lateral Flexion - Resisted, Mid to End Range   Facing forward, fingertips on right temple, tilt head toward shoulder. Give light resistance. Hold 5 sec.  Repeat _3___ times per set. Do __1__ sets per session. Do __2__ sessions per day. Then do other side. http://orth.exer.us/326   Copyright  VHI. All rights reserved.  Strengthening: Rotation - Resisted, Beginning to End Range   Facing right side, fingertips on other temple, turn head toward hand. Give light resistance. Hold 5 sec.  Repeat __3__ times per set. Do __1__ sets per session. Do __2__ sessions per day.  http://orth.exer.us/332   Copyright  VHI. All rights reserved.  Patient able to return demonstration correctly with the above exercises.

## 2014-03-22 ENCOUNTER — Ambulatory Visit: Payer: Medicare Other | Admitting: Physical Therapy

## 2014-03-23 ENCOUNTER — Ambulatory Visit: Payer: Medicare Other

## 2014-03-23 DIAGNOSIS — M5382 Other specified dorsopathies, cervical region: Secondary | ICD-10-CM

## 2014-03-23 DIAGNOSIS — E785 Hyperlipidemia, unspecified: Secondary | ICD-10-CM | POA: Diagnosis not present

## 2014-03-23 DIAGNOSIS — M542 Cervicalgia: Secondary | ICD-10-CM

## 2014-03-23 DIAGNOSIS — I2510 Atherosclerotic heart disease of native coronary artery without angina pectoris: Secondary | ICD-10-CM | POA: Diagnosis not present

## 2014-03-23 DIAGNOSIS — I10 Essential (primary) hypertension: Secondary | ICD-10-CM | POA: Diagnosis not present

## 2014-03-23 DIAGNOSIS — G4733 Obstructive sleep apnea (adult) (pediatric): Secondary | ICD-10-CM | POA: Diagnosis not present

## 2014-03-23 DIAGNOSIS — M6281 Muscle weakness (generalized): Secondary | ICD-10-CM | POA: Diagnosis not present

## 2014-03-23 NOTE — Patient Instructions (Signed)
Posture - Standing   Good posture is important. Avoid slouching and forward head thrust. Maintain curve in low back and align ears over shoulders, hips over ankles.  Pull your belly button in toward your back bone. Posture Tips DO: - stand tall and erect - keep chin tucked in - keep head and shoulders in alignment - check posture regularly in mirror or large window - pull head back against headrest in car seat;  Change your position often.  Sit with lumbar support. DON'T: - slouch or slump while watching TV or reading - sit, stand or lie in one position  for too long;  Sitting is especially hard on the spine so if you sit at a desk/use the computer, then stand up often! Copyright  VHI. All rights reserved.  Posture - Sitting  Sit upright, head facing forward. Try using a roll to support lower back. Keep shoulders relaxed, and avoid rounded back. Keep hips level with knees. Avoid crossing legs for long periods. Copyright  VHI. All rights reserved.  Chronic neck strain can develop because of poor posture and faulty work habits  Postural strain related to slumped sitting and forward head posture is a leading cause of headaches, neck and upper back pain  General strengthening and flexibility exercises are helpful in the treatment of neck pain.  Most importantly, you should learn to correct the posture that may be contributing to chronic pain.   Change positions frequently  Change your work or home environment to improve posture and mechanics.

## 2014-03-23 NOTE — Therapy (Signed)
[facility] [address], [place], [place], [address] Phone: [phone]   Fax:  [phone]  Physical Therapy Treatment  Patient Details  Name: [name] MRN: [id number] Date of Birth: [birth year] Referring Provider:  [name], MD  Encounter Date: 03/23/2014      PT End of Session - 03/23/14 0922    Visit Number 3   Number of Visits 10  Medicare   Date for PT Re-Evaluation 05/03/14   PT Start Time 0850   PT Stop Time 0933   PT Time Calculation (min) 43 min   Activity Tolerance Patient tolerated treatment well   Behavior During Therapy [facility] for tasks assessed/performed      Past Medical History  Diagnosis Date  . OSA (obstructive sleep apnea)   . History of bronchitis   . Other diseases of lung, not elsewhere classified   . Hypertension   . CAD (coronary artery disease)     LAD 30% 2006  . History of atrial flutter   . Cerebrovascular disease   . Hyperlipemia   . Obesity   . GERD (gastroesophageal reflux disease)   . Diverticulosis of colon   . IBS (irritable bowel syndrome)   . Colonic polyp   . Crohn's disease   . Hypothyroidism   . History of hyperparathyroidism   . Fibromyalgia   . Osteopenia   . History of headache   . Syncope   . Restless legs syndrome (RLS)   . Psychosis   . History of tuberculosis   . Radiation 05/2008    5000 cGy to left lower eyelid, parotid lymphatics and upper neck  . Memory disturbance   . Bipolar affective     History of psychosis  . Merkel cell tumor     Left eyelid    Past Surgical History  Procedure Laterality Date  . Parathyroid adenoma surgery    . Cholecystectomy    . Moh';s surg left lower eye lid surgery and lid reconstruction surgery  2/10  . Bladder suspension    . Cataract extraction Bilateral   . Appendectomy    . Tonsillectomy and adenoidectomy    . Lumbar laminectomy    . Incision and drainage perirectal abscess    . Heel spur  excision    . Nasal sinus surgery Right     Right maxillary  . Eye lid surgery  04/04/2011    [facility] by Dr. Norlene Duel  . Posterior cervical fusion/foraminotomy N/A 10/12/2013    Procedure: Cervical five-Thoracic two cervico-thoracic posterior fusion;  Surgeon: [name], MD;  Location: [facility];  Service: Neurosurgery;  Laterality: N/A;    There were no vitals filed for this visit.  Visit Diagnosis:  Neck muscle weakness  Cervical pain (neck)      Subjective Assessment - 03/23/14 0853    Symptoms Pt has been doing exercises at home.   Currently in Pain? Yes   Pain Score 0-No pain   Pain Location Neck   Pain Descriptors / Indicators Sore   Pain Type Chronic pain   Aggravating Factors  unknown   Pain Relieving Factors heat, topical cream   Multiple Pain Sites No                       [facility] Adult PT Treatment/Exercise - 03/23/14 0001    Exercises   Exercises Neck   Neck Exercises: Seated   Cervical Isometrics Flexion;Extension;Right lateral flexion;Left lateral flexion;Right  rotation;Left rotation;5 reps;3 secs  Good return demo of all HEP with minor verbal cues needed.   Modalities   Modalities Electrical Stimulation   Moist Heat Therapy   Number Minutes Moist Heat 15 Minutes   Moist Heat Location Other (comment)  cervical   Electrical Stimulation   Electrical Stimulation Location cervical   Electrical Stimulation Action IFC   Electrical Stimulation Parameters 15 minutes   Electrical Stimulation Goals Pain   Manual Therapy   Manual Therapy Myofascial release   Myofascial Release Soft tissue elongation to Rt and Lt UT and cervical papraspinals                PT Education - 03/23/14 0859    Education provided Yes   Education Details Posture education   Person(s) Educated Patient;Spouse   Methods Explanation;Demonstration;Handout   Comprehension Verbalized understanding;Returned demonstration          PT Short Term Goals - 03/23/14  0921    PT SHORT TERM GOAL #1   Title understand correct posture in sitting and standing   Time 4   Status Achieved  Posture education received today   PT SHORT TERM GOAL #2   Title understand correct HEP for cervical isometrics and low level shoulder exercises   Time 4   Period Weeks   Status --  independent in cervical isometrics   PT SHORT TERM GOAL #3   Title cervical pain decreased >/= 25%with daily acitivites   Time 4   Period Weeks   Status Achieved  No pain reported today           PT Long Term Goals - 03/17/14 1141    PT LONG TERM GOAL #1   Title Cervical pain with daily activities decreased >/= 50%   Time 8   Period Weeks   Status New  due to just starting therapy   PT LONG TERM GOAL #2   Title increased shoulder strength >/= 4+/5 making it easier to perfrom daily acitivites   Time 8   Period Weeks   Status New  has not learned shoulder exercises yet   PT LONG TERM GOAL #3   Title hold her head up for 5 minutes due to increased cervical strength   Time 8   Period Weeks   Status New  learning cervical exercises   PT LONG TERM GOAL #4   Title TUG score </= 13 seconds   Time 8   Period Weeks   Status New  just started therapy               Plan - 03/23/14 0859    Clinical Impression Statement Pt with Rt upper trap stiffness>Lt and forward head posture.  Pt is indepdent in current HEP.   Rehab Potential Good   PT Frequency 2x / week   PT Duration 8 weeks   PT Treatment/Interventions Moist Heat;Therapeutic activities;Patient/family education;Passive range of motion;Therapeutic exercise;Ultrasound;Balance training;Gait training;Manual techniques;Neuromuscular re-education;Functional mobility training   PT Next Visit Plan soft tissue, e-stim and heat, postural strength   Consulted and Agree with Plan of Care Patient        Problem List Patient Active Problem List   Diagnosis Date Noted  . Allergic rhinitis 11/04/2013  . Insomnia  11/04/2013  . Senile dementia 10/22/2013  . C7 cervical fracture 10/12/2013  . Obesity (BMI 30-39.9) 04/01/2013  . Osteoarthritis 12/16/2012  . Memory impairment 01/15/2012  . Low back pain 02/13/2011  . Low back strain 02/13/2011  . MERKEL  CELL TUMOR 12/14/2009  . OTHER DISEASES OF LUNG NOT ELSEWHERE CLASSIFIED 12/14/2009  . RESTLESS LEG SYNDROME 10/16/2008  . PSYCHOSIS 12/15/2007  . Regional enteritis 12/15/2007  . CEREBROVASCULAR DISEASE 08/12/2007  . COLONIC POLYPS 04/29/2007  . Hypothyroidism 04/29/2007  . BRONCHITIS, RECURRENT 04/29/2007  . DIVERTICULOSIS OF COLON 04/29/2007  . IRRITABLE BOWEL SYNDROME 04/29/2007  . SYNCOPE 04/28/2007  . OBESITY 01/29/2007  . Coronary atherosclerosis 01/29/2007  . ATRIAL FLUTTER 01/29/2007  . GASTROESOPHAGEAL REFLUX DISEASE 01/29/2007  . OSTEOPENIA 01/29/2007  . HEADACHE, MIXED 01/29/2007  . Hyperlipidemia 01/12/2007  . OBSTRUCTIVE SLEEP APNEA 01/12/2007  . Essential hypertension 01/12/2007  . FIBROMYALGIA 01/12/2007  . HYPERPARATHYROIDISM, HX OF 10/21/2006    Vernon Ariel, PT 03/23/2014, 9:24 AM  [facility] [address], [place], [place], [address] Phone: [phone]   Fax:  [phone]

## 2014-03-24 ENCOUNTER — Ambulatory Visit: Payer: Medicare Other | Admitting: Physical Therapy

## 2014-03-24 ENCOUNTER — Encounter: Payer: Self-pay | Admitting: Physical Therapy

## 2014-03-24 DIAGNOSIS — I10 Essential (primary) hypertension: Secondary | ICD-10-CM | POA: Diagnosis not present

## 2014-03-24 DIAGNOSIS — G4733 Obstructive sleep apnea (adult) (pediatric): Secondary | ICD-10-CM | POA: Diagnosis not present

## 2014-03-24 DIAGNOSIS — I2510 Atherosclerotic heart disease of native coronary artery without angina pectoris: Secondary | ICD-10-CM | POA: Diagnosis not present

## 2014-03-24 DIAGNOSIS — M5382 Other specified dorsopathies, cervical region: Secondary | ICD-10-CM

## 2014-03-24 DIAGNOSIS — E785 Hyperlipidemia, unspecified: Secondary | ICD-10-CM | POA: Diagnosis not present

## 2014-03-24 DIAGNOSIS — M542 Cervicalgia: Secondary | ICD-10-CM | POA: Diagnosis not present

## 2014-03-24 DIAGNOSIS — M6281 Muscle weakness (generalized): Secondary | ICD-10-CM | POA: Diagnosis not present

## 2014-03-24 NOTE — Therapy (Signed)
[facility] [address] [place], [place], [address] Phone: [phone]   Fax:  [phone]  Physical Therapy Treatment  Patient Details  Name: [name] MRN: [id number] Date of Birth: 09-30-1932 Referring Provider:  [name], MD  Encounter Date: 03/24/2014      PT End of Session - 03/24/14 0859    Visit Number 4   Number of Visits 10  Medicare   Date for PT Re-Evaluation 05/03/14   PT Start Time 0825   PT Stop Time 0920   PT Time Calculation (min) 55 min   Activity Tolerance Patient tolerated treatment well   Behavior During Therapy [facility] for tasks assessed/performed      Past Medical History  Diagnosis Date  . OSA (obstructive sleep apnea)   . History of bronchitis   . Other diseases of lung, not elsewhere classified   . Hypertension   . CAD (coronary artery disease)     LAD 30% 2006  . History of atrial flutter   . Cerebrovascular disease   . Hyperlipemia   . Obesity   . GERD (gastroesophageal reflux disease)   . Diverticulosis of colon   . IBS (irritable bowel syndrome)   . Colonic polyp   . Crohn's disease   . Hypothyroidism   . History of hyperparathyroidism   . Fibromyalgia   . Osteopenia   . History of headache   . Syncope   . Restless legs syndrome (RLS)   . Psychosis   . History of tuberculosis   . Radiation 05/2008    5000 cGy to left lower eyelid, parotid lymphatics and upper neck  . Memory disturbance   . Bipolar affective     History of psychosis  . Merkel cell tumor     Left eyelid    Past Surgical History  Procedure Laterality Date  . Parathyroid adenoma surgery    . Cholecystectomy    . Moh';s surg left lower eye lid surgery and lid reconstruction surgery  2/10  . Bladder suspension    . Cataract extraction Bilateral   . Appendectomy    . Tonsillectomy and adenoidectomy    . Lumbar laminectomy    . Incision and drainage perirectal abscess    . Heel spur  excision    . Nasal sinus surgery Right     Right maxillary  . Eye lid surgery  04/04/2011    [facility] by Dr. Norlene Duel  . Posterior cervical fusion/foraminotomy N/A 10/12/2013    Procedure: Cervical five-Thoracic two cervico-thoracic posterior fusion;  Surgeon: [name], MD;  Location: [facility];  Service: Neurosurgery;  Laterality: N/A;    There were no vitals filed for this visit.  Visit Diagnosis:  Neck muscle weakness  Cervical pain (neck)      Subjective Assessment - 03/24/14 0826    Symptoms Patient reports she still has neck pain. Neck pain has improved by 50%.    Pertinent History Patient has broken her lumbar spine. No surgery to spine.   Limitations Reading   How long can you sit comfortably? No difficulty   How long can you stand comfortably? Not difficulty   How long can you walk comfortably? Not difficulty   Diagnostic tests Not difficulty   Patient Stated Goals Reduce pain in cervical   Currently in Pain? Yes   Pain Score 5    Pain Location Neck   Pain Orientation Posterior   Pain Descriptors / Indicators Aching   Pain  Type Chronic pain   Pain Onset More than a month ago   Pain Frequency Intermittent   Aggravating Factors  movement of cervical   Pain Relieving Factors hot pack   Effect of Pain on Daily Activities hold head upward   Multiple Pain Sites No            [facility] PT Assessment - 03/24/14 0001    AROM   Cervical Extension decreased by 100%   Cervical - Right Side Bend decreased by 50%   Cervical - Left Side Bend decreased by 75%   Cervical - Right Rotation decreased by 50%   Cervical - Left Rotation decreaesd by 50%   Strength   Cervical Extension 3+/5   Cervical - Right Side Bend 4+/5   Cervical - Left Side Bend 4+/5   Cervical - Right Rotation 4+/5   Cervical - Left Rotation 4+/5                   [facility] Adult PT Treatment/Exercise - 03/24/14 0001    Neck Exercises: Seated   Shoulder Rolls Backwards;15 reps   Shoulder  Flexion Right;Left;10 reps;Other reps (comment)   Shoulder Flexion Weights (lbs) no wt.    Shoulder ABduction Right;Left;15 reps   Shoulder Abduction Weights (lbs) 0 weights   Neck Exercises: Supine   Upper Extremity D2 10 reps;Theraband  tactile cues required   Theraband Level (UE D2) Level 1 (Yellow)   Other Supine Exercise horizontal abd. with yellow band 15 times with head press and VC on technique   Other Supine Exercise head press with bil. shoulder ER with yellow band and tactile cues x20   Modalities   Modalities Electrical Stimulation;Moist Heat   Moist Heat Therapy   Number Minutes Moist Heat 20 Minutes   Moist Heat Location Other (comment)  cervical   Electrical Stimulation   Electrical Stimulation Location cervical   Electrical Stimulation Action IFC   Electrical Stimulation Parameters 20   Electrical Stimulation Goals Pain   Manual Therapy   Manual Therapy Massage   Myofascial Release soft tissue work to cervical paraspinals, suboccipitals, scalenes                PT Education - 03/24/14 0859    Education provided No          PT Short Term Goals - 03/23/14 0921    PT SHORT TERM GOAL #1   Title understand correct posture in sitting and standing   Time 4   Status Achieved  Posture education received today   PT SHORT TERM GOAL #2   Title understand correct HEP for cervical isometrics and low level shoulder exercises   Time 4   Period Weeks   Status --  independent in cervical isometrics   PT SHORT TERM GOAL #3   Title cervical pain decreased >/= 25%with daily acitivites   Time 4   Period Weeks   Status Achieved  No pain reported today           PT Long Term Goals - 03/17/14 1141    PT LONG TERM GOAL #1   Title Cervical pain with daily activities decreased >/= 50%   Time 8   Period Weeks   Status New  due to just starting therapy   PT LONG TERM GOAL #2   Title increased shoulder strength >/= 4+/5 making it easier to perfrom daily  acitivites   Time 8   Period Weeks   Status New  has  not learned shoulder exercises yet   PT LONG TERM GOAL #3   Title hold her head up for 5 minutes due to increased cervical strength   Time 8   Period Weeks   Status New  learning cervical exercises   PT LONG TERM GOAL #4   Title TUG score </= 13 seconds   Time 8   Period Weeks   Status New  just started therapy               Plan - 03/24/14 0900    Clinical Impression Statement Patient is able to hold her head up longer.  Patient is performing strengthening with resistive bands and tolerated well. Patient has increased cervical strength.   Pt will benefit from skilled therapeutic intervention in order to improve on the following deficits Decreased range of motion;Impaired flexibility;Improper body mechanics;Postural dysfunction;Decreased endurance;Decreased activity tolerance;Increased fascial restricitons;Pain;Increased muscle spasms;Decreased balance;Decreased mobility;Decreased strength   Rehab Potential Good   Clinical Impairments Affecting Rehab Potential None   PT Frequency 2x / week   PT Duration 8 weeks   PT Treatment/Interventions Moist Heat;Therapeutic activities;Patient/family education;Passive range of motion;Therapeutic exercise;Ultrasound;Balance training;Gait training;Manual techniques;Neuromuscular re-education;Functional mobility training   PT Next Visit Plan balance ex, soft tissue work, heat and IFC   PT Home Exercise Plan balance ex.   Recommended Other Services None   Consulted and Agree with Plan of Care Patient        Problem List Patient Active Problem List   Diagnosis Date Noted  . Allergic rhinitis 11/04/2013  . Insomnia 11/04/2013  . Senile dementia 10/22/2013  . C7 cervical fracture 10/12/2013  . Obesity (BMI 30-39.9) 04/01/2013  . Osteoarthritis 12/16/2012  . Memory impairment 01/15/2012  . Low back pain 02/13/2011  . Low back strain 02/13/2011  . MERKEL CELL TUMOR 12/14/2009  .  OTHER DISEASES OF LUNG NOT ELSEWHERE CLASSIFIED 12/14/2009  . RESTLESS LEG SYNDROME 10/16/2008  . PSYCHOSIS 12/15/2007  . Regional enteritis 12/15/2007  . CEREBROVASCULAR DISEASE 08/12/2007  . COLONIC POLYPS 04/29/2007  . Hypothyroidism 04/29/2007  . BRONCHITIS, RECURRENT 04/29/2007  . DIVERTICULOSIS OF COLON 04/29/2007  . IRRITABLE BOWEL SYNDROME 04/29/2007  . SYNCOPE 04/28/2007  . OBESITY 01/29/2007  . Coronary atherosclerosis 01/29/2007  . ATRIAL FLUTTER 01/29/2007  . GASTROESOPHAGEAL REFLUX DISEASE 01/29/2007  . OSTEOPENIA 01/29/2007  . HEADACHE, MIXED 01/29/2007  . Hyperlipidemia 01/12/2007  . OBSTRUCTIVE SLEEP APNEA 01/12/2007  . Essential hypertension 01/12/2007  . FIBROMYALGIA 01/12/2007  . HYPERPARATHYROIDISM, HX OF 10/21/2006    GRAY,CHERYL,PT 03/24/2014, 9:03 AM  [facility] [address], [place], [place], [address] Phone: [phone]   Fax:  [phone]

## 2014-03-29 ENCOUNTER — Ambulatory Visit: Payer: Medicare Other | Admitting: Physical Therapy

## 2014-03-31 ENCOUNTER — Ambulatory Visit: Payer: Medicare Other | Admitting: Physical Therapy

## 2014-03-31 ENCOUNTER — Encounter: Payer: Self-pay | Admitting: Physical Therapy

## 2014-03-31 DIAGNOSIS — G4733 Obstructive sleep apnea (adult) (pediatric): Secondary | ICD-10-CM | POA: Diagnosis not present

## 2014-03-31 DIAGNOSIS — M6281 Muscle weakness (generalized): Secondary | ICD-10-CM | POA: Diagnosis not present

## 2014-03-31 DIAGNOSIS — M542 Cervicalgia: Secondary | ICD-10-CM

## 2014-03-31 DIAGNOSIS — I2510 Atherosclerotic heart disease of native coronary artery without angina pectoris: Secondary | ICD-10-CM | POA: Diagnosis not present

## 2014-03-31 DIAGNOSIS — M5382 Other specified dorsopathies, cervical region: Secondary | ICD-10-CM

## 2014-03-31 DIAGNOSIS — E785 Hyperlipidemia, unspecified: Secondary | ICD-10-CM | POA: Diagnosis not present

## 2014-03-31 DIAGNOSIS — I10 Essential (primary) hypertension: Secondary | ICD-10-CM | POA: Diagnosis not present

## 2014-03-31 NOTE — Therapy (Signed)
[facility] [address] [place], [place], [address] Phone: [phone]   Fax:  [phone]  Physical Therapy Treatment  Patient Details  Name: [name] MRN: [id number] Date of Birth: 1932/05/05 Referring Provider:  [name], MD  Encounter Date: 03/31/2014      PT End of Session - 03/31/14 0840    Visit Number 5   Number of Visits 10  Medicare   Date for PT Re-Evaluation 05/03/14   PT Start Time 0800   PT Stop Time 0900   PT Time Calculation (min) 60 min   Activity Tolerance Patient tolerated treatment well   Behavior During Therapy [facility] for tasks assessed/performed      Past Medical History  Diagnosis Date  . OSA (obstructive sleep apnea)   . History of bronchitis   . Other diseases of lung, not elsewhere classified   . Hypertension   . CAD (coronary artery disease)     LAD 30% 2006  . History of atrial flutter   . Cerebrovascular disease   . Hyperlipemia   . Obesity   . GERD (gastroesophageal reflux disease)   . Diverticulosis of colon   . IBS (irritable bowel syndrome)   . Colonic polyp   . Crohn's disease   . Hypothyroidism   . History of hyperparathyroidism   . Fibromyalgia   . Osteopenia   . History of headache   . Syncope   . Restless legs syndrome (RLS)   . Psychosis   . History of tuberculosis   . Radiation 05/2008    5000 cGy to left lower eyelid, parotid lymphatics and upper neck  . Memory disturbance   . Bipolar affective     History of psychosis  . Merkel cell tumor     Left eyelid    Past Surgical History  Procedure Laterality Date  . Parathyroid adenoma surgery    . Cholecystectomy    . Moh';s surg left lower eye lid surgery and lid reconstruction surgery  2/10  . Bladder suspension    . Cataract extraction Bilateral   . Appendectomy    . Tonsillectomy and adenoidectomy    . Lumbar laminectomy    . Incision and drainage perirectal abscess    . Heel spur  excision    . Nasal sinus surgery Right     Right maxillary  . Eye lid surgery  04/04/2011    [facility] by Dr. Norlene Duel  . Posterior cervical fusion/foraminotomy N/A 10/12/2013    Procedure: Cervical five-Thoracic two cervico-thoracic posterior fusion;  Surgeon: [name], MD;  Location: [facility];  Service: Neurosurgery;  Laterality: N/A;    There were no vitals filed for this visit.  Visit Diagnosis:  Neck muscle weakness  Cervical pain (neck)      Subjective Assessment - 03/31/14 0810    Symptoms I missed last appt. due to my brother-in-law passing away. Tightness in cervical is 50% better.    Pertinent History Patient has broken her lumbar spine. No surgery to spine.   How long can you sit comfortably? No difficulty   How long can you stand comfortably? Not difficulty   How long can you walk comfortably? Not difficulty   Diagnostic tests Not difficulty   Patient Stated Goals Reduce pain in cervical   Currently in Pain? Yes   Pain Score 5    Pain Location Neck   Pain Orientation Posterior   Pain Descriptors / Indicators Aching   Pain Type  Chronic pain   Pain Onset More than a month ago   Pain Frequency Constant   Aggravating Factors  holding head up hears crunching   Pain Relieving Factors hot pack   Effect of Pain on Daily Activities head up   Multiple Pain Sites No            [facility] PT Assessment - 03/31/14 0001    AROM   Cervical Extension decreased by 100%   Cervical - Right Side Bend decreased by 50%   Cervical - Left Side Bend decreased by 50%   Cervical - Right Rotation decreased by 50%   Cervical - Left Rotation decreased by 50%   Timed Up and Go Test   TUG Normal TUG   Normal TUG (seconds) 20  2nd 18 sec, 3rd 16 sec.                    [facility] Adult PT Treatment/Exercise - 03/31/14 0001    Moist Heat Therapy   Number Minutes Moist Heat 20 Minutes   Moist Heat Location Other (comment)  cervical   Electrical Stimulation   Electrical  Stimulation Location cervical   Electrical Stimulation Action IFC   Electrical Stimulation Parameters 20   Electrical Stimulation Goals Pain   Manual Therapy   Manual Therapy Massage   Myofascial Release soft tissue work to cervical paraspinals, suboccipitals, scalenes                PT Education - 03/31/14 [phone]    Education provided Yes   Education Details standing hp abd, ext, and marching.  Walking program, Educated patient on walkers to purchase and where to get one and  to get a script from doctor to put into insurance.    Person(s) Educated Patient   Methods Explanation;Handout;Demonstration   Comprehension Verbalized understanding;Returned demonstration          PT Short Term Goals - 03/31/14 0842    PT SHORT TERM GOAL #2   Title understand correct HEP for cervical isometrics and low level shoulder exercises   Time 4   Period Weeks   Status On-going  still working on exercises           PT Long Term Goals - 03/31/14 0843    PT LONG TERM GOAL #1   Title Cervical pain with daily activities decreased >/= 50%   Time 8   Period Weeks   Status Achieved   PT LONG TERM GOAL #2   Title increased shoulder strength >/= 4+/5 making it easier to perfrom daily acitivites   Time 8   Period Weeks   Status New  still has weakness   PT LONG TERM GOAL #3   Title hold her head up for 5 minutes due to increased cervical strength   Time 8   Period Weeks   Status New  constant vc to hold head up   PT LONG TERM GOAL #4   Title TUG score </= 13 seconds   Time 8   Period Weeks   Status On-going  16-20 seconds               Plan - 03/31/14 0841    Clinical Impression Statement Patient needs constant verbal cues to hold her head up in sitting and standing due to pain and weakness.  Patient had improved TUG score after 3 trials.    Pt will benefit from skilled therapeutic intervention in order to improve on the following deficits Decreased  range of  motion;Impaired flexibility;Improper body mechanics;Postural dysfunction;Decreased endurance;Decreased activity tolerance;Increased fascial restricitons;Pain;Increased muscle spasms;Decreased balance;Decreased mobility;Decreased strength   Rehab Potential Good   Clinical Impairments Affecting Rehab Potential None   PT Frequency 2x / week   PT Duration 8 weeks   PT Treatment/Interventions Moist Heat;Therapeutic activities;Patient/family education;Passive range of motion;Therapeutic exercise;Ultrasound;Balance training;Gait training;Manual techniques;Neuromuscular re-education;Functional mobility training   PT Next Visit Plan work on cervical strength and balance with vc to hold head up    PT Home Exercise Plan continue with current HEP   Recommended Other Services None   Consulted and Agree with Plan of Care Patient        Problem List Patient Active Problem List   Diagnosis Date Noted  . Allergic rhinitis 11/04/2013  . Insomnia 11/04/2013  . Senile dementia 10/22/2013  . C7 cervical fracture 10/12/2013  . Obesity (BMI 30-39.9) 04/01/2013  . Osteoarthritis 12/16/2012  . Memory impairment 01/15/2012  . Low back pain 02/13/2011  . Low back strain 02/13/2011  . MERKEL CELL TUMOR 12/14/2009  . OTHER DISEASES OF LUNG NOT ELSEWHERE CLASSIFIED 12/14/2009  . RESTLESS LEG SYNDROME 10/16/2008  . PSYCHOSIS 12/15/2007  . Regional enteritis 12/15/2007  . CEREBROVASCULAR DISEASE 08/12/2007  . COLONIC POLYPS 04/29/2007  . Hypothyroidism 04/29/2007  . BRONCHITIS, RECURRENT 04/29/2007  . DIVERTICULOSIS OF COLON 04/29/2007  . IRRITABLE BOWEL SYNDROME 04/29/2007  . SYNCOPE 04/28/2007  . OBESITY 01/29/2007  . Coronary atherosclerosis 01/29/2007  . ATRIAL FLUTTER 01/29/2007  . GASTROESOPHAGEAL REFLUX DISEASE 01/29/2007  . OSTEOPENIA 01/29/2007  . HEADACHE, MIXED 01/29/2007  . Hyperlipidemia 01/12/2007  . OBSTRUCTIVE SLEEP APNEA 01/12/2007  . Essential hypertension 01/12/2007  . FIBROMYALGIA  01/12/2007  . HYPERPARATHYROIDISM, HX OF 10/21/2006    GRAY,CHERYL,PT 03/31/2014, 8:45 AM   [facility] [address], [place], [place], [address] Phone: [phone]   Fax:  [phone]

## 2014-03-31 NOTE — Patient Instructions (Signed)
WALKING  Walking is a great form of exercise to increase your strength, endurance and overall fitness.  A walking program can help you start slowly and gradually build endurance as you go.  Everyone's ability is different, so each person's starting point will be different.  You do not have to follow them exactly.  The are just samples. You should simply find out what's right for you and stick to that program.   In the beginning, you'll start off walking 2-3 times a day for short distances.  As you get stronger, you'll be walking further at just 1-2 times per day.  A. You Can Walk For A Certain Length Of Time Each Day    Walk 5 minutes 3 times per day.  Increase 2 minutes every 2 days (3 times per day).  Work up to 25-30 minutes (1-2 times per day).   Example:   Day 1-2 5 minutes 3 times per day   Day 7-8 12 minutes 2-3 times per day   Day 13-14 25 minutes 1-2 times per day  B. You Can Walk For a Certain Distance Each Day     Distance can be substituted for time.    Example:   3 trips to mailbox (at road)   3 trips to corner of block   3 trips around the block  C. Go to local [employer] and use the track. Walk where you live.    Walk for distance _3___ around  Circle hallway  Or time ____ minutes  D. Walk _c___ Jog ____ Run ___  Please only do the exercises that your therapist has initialed and dated  Knee High   Holding stable object, raise knee to hip level, then lower knee. Repeat with other knee. Hold head up high. Complete __10_ repetitions. Do __2__ sessions per day.  ABDUCTION: Standing (Active)   Stand, feet flat. Lift right leg out to side. Use _0__ lbs. Hold up high.  Complete __10_ repetitions. Perform __1_ sessions per day.    EXTENSION: Standing (Active)  Stand, both feet flat. Draw right leg behind body as far as possible. Use 0___ lbs. Hold head up high.  Complete 10 repetitions. Perform __1_ sessions per day.  Copyright  VHI. All rights reserved.    Patient able to return demonstration of above exercises correctly.

## 2014-04-05 ENCOUNTER — Ambulatory Visit: Payer: Medicare Other | Admitting: Physical Therapy

## 2014-04-05 ENCOUNTER — Encounter: Payer: Self-pay | Admitting: Physical Therapy

## 2014-04-05 DIAGNOSIS — E785 Hyperlipidemia, unspecified: Secondary | ICD-10-CM | POA: Diagnosis not present

## 2014-04-05 DIAGNOSIS — M542 Cervicalgia: Secondary | ICD-10-CM | POA: Diagnosis not present

## 2014-04-05 DIAGNOSIS — I10 Essential (primary) hypertension: Secondary | ICD-10-CM | POA: Diagnosis not present

## 2014-04-05 DIAGNOSIS — M5382 Other specified dorsopathies, cervical region: Secondary | ICD-10-CM

## 2014-04-05 DIAGNOSIS — G4733 Obstructive sleep apnea (adult) (pediatric): Secondary | ICD-10-CM | POA: Diagnosis not present

## 2014-04-05 DIAGNOSIS — I2510 Atherosclerotic heart disease of native coronary artery without angina pectoris: Secondary | ICD-10-CM | POA: Diagnosis not present

## 2014-04-05 DIAGNOSIS — M6281 Muscle weakness (generalized): Secondary | ICD-10-CM | POA: Diagnosis not present

## 2014-04-05 NOTE — Therapy (Signed)
[facility] [address] [place], [place], [address] Phone: [phone]   Fax:  [phone]  Physical Therapy Treatment  Patient Details  Name: [name] MRN: [id number] Date of Birth: [birth year] Referring Provider:  [name], MD  Encounter Date: 04/05/2014      PT End of Session - 04/05/14 0926    Visit Number 6   Number of Visits 10  Medicare   Date for PT Re-Evaluation 05/03/14   PT Start Time 0845   PT Stop Time 0945   PT Time Calculation (min) 60 min   Activity Tolerance Patient tolerated treatment well   Behavior During Therapy [facility] for tasks assessed/performed      Past Medical History  Diagnosis Date  . OSA (obstructive sleep apnea)   . History of bronchitis   . Other diseases of lung, not elsewhere classified   . Hypertension   . CAD (coronary artery disease)     LAD 30% 2006  . History of atrial flutter   . Cerebrovascular disease   . Hyperlipemia   . Obesity   . GERD (gastroesophageal reflux disease)   . Diverticulosis of colon   . IBS (irritable bowel syndrome)   . Colonic polyp   . Crohn's disease   . Hypothyroidism   . History of hyperparathyroidism   . Fibromyalgia   . Osteopenia   . History of headache   . Syncope   . Restless legs syndrome (RLS)   . Psychosis   . History of tuberculosis   . Radiation 05/2008    5000 cGy to left lower eyelid, parotid lymphatics and upper neck  . Memory disturbance   . Bipolar affective     History of psychosis  . Merkel cell tumor     Left eyelid    Past Surgical History  Procedure Laterality Date  . Parathyroid adenoma surgery    . Cholecystectomy    . Moh';s surg left lower eye lid surgery and lid reconstruction surgery  2/10  . Bladder suspension    . Cataract extraction Bilateral   . Appendectomy    . Tonsillectomy and adenoidectomy    . Lumbar laminectomy    . Incision and drainage perirectal abscess    . Heel spur  excision    . Nasal sinus surgery Right     Right maxillary  . Eye lid surgery  04/04/2011    [facility] by Dr. Norlene Duel  . Posterior cervical fusion/foraminotomy N/A 10/12/2013    Procedure: Cervical five-Thoracic two cervico-thoracic posterior fusion;  Surgeon: [name], MD;  Location: [place] [facility];  Service: Neurosurgery;  Laterality: N/A;    There were no vitals filed for this visit.  Visit Diagnosis:  Neck muscle weakness  Cervical pain (neck)      Subjective Assessment - 04/05/14 0854    Symptoms Patient reports no change in pain but I have more function.  I got a walker and have been walking in a circle 1 time per day.     Pertinent History Patient has broken her lumbar spine. No surgery to spine.   Limitations Reading   How long can you sit comfortably? No difficulty   How long can you stand comfortably? No difficulty   How long can you walk comfortably? no difficulty   Patient Stated Goals Reduce pain in cervical   Currently in Pain? Yes   Pain Score 5    Pain Location Neck   Pain Orientation Posterior  Pain Descriptors / Indicators Aching   Pain Type Chronic pain   Pain Onset More than a month ago   Pain Frequency Constant   Aggravating Factors  holding head up   Pain Relieving Factors hot pack, muscle rub   Effect of Pain on Daily Activities head up    Multiple Pain Sites No            [facility] PT Assessment - 04/05/14 0001    Strength   Overall Strength --  bil. shoulder strength is 4/5   Cervical Extension 3+/5   Cervical - Right Side Bend 4+/5   Cervical - Left Side Bend 4+/5   Cervical - Right Rotation 4+/5   Cervical - Left Rotation 4+/5                   [facility] Adult PT Treatment/Exercise - 04/05/14 0001    Neck Exercises: Supine   Shoulder Flexion Both;10 reps   Shoulder Flexion Limitations press head into pillow   Theraband Level (UE D1) Level 1 (Yellow)   UE D1 Limitations asistance for correct arm movement, 10 x each way   Other  Supine Exercise horizontal abd. with yellow band 15 times with head press and VC on technique   Other Supine Exercise bil. shoulder ER with head press 10x2   Moist Heat Therapy   Number Minutes Moist Heat 20 Minutes   Moist Heat Location Other (comment)  cervical   Electrical Stimulation   Electrical Stimulation Location cervical   Electrical Stimulation Action IFC   Electrical Stimulation Parameters 20   Electrical Stimulation Goals Pain   Manual Therapy   Manual Therapy Massage   Myofascial Release soft tissue work to cervical paraspinals, suboccipitals, scalenes                PT Education - 04/05/14 [phone]    Education provided No          PT Short Term Goals - 04/05/14 0928    PT SHORT TERM GOAL #2   Title understand correct HEP for cervical isometrics and low level shoulder exercises   Time 4   Period Weeks   Status Achieved           PT Long Term Goals - 04/05/14 0102    PT LONG TERM GOAL #2   Title increased shoulder strength >/= 4+/5 making it easier to perfrom daily acitivites   Time 8   Period Weeks   Status --  4/5 for bil. shoulder   PT LONG TERM GOAL #3   Title hold her head up for 5 minutes due to increased cervical strength   Time 8   Period Weeks   Status --  constant VC to hold head up   PT LONG TERM GOAL #4   Title TUG score </= 13 seconds   Time 8   Period Weeks   Status On-going               Plan - 04/05/14 7253    Clinical Impression Statement Patient continues to have difficulty with holding her head up in standing and sitting causing strain on the cervical musculature. Patient has weakness in shoulder and cervical musculature.    Pt will benefit from skilled therapeutic intervention in order to improve on the following deficits Decreased range of motion;Impaired flexibility;Improper body mechanics;Postural dysfunction;Decreased endurance;Decreased activity tolerance;Increased fascial restricitons;Pain;Increased muscle  spasms;Decreased balance;Decreased mobility;Decreased strength   Rehab Potential Good   Clinical Impairments Affecting Rehab Potential  None   PT Frequency 1x / week   PT Duration 4 weeks   PT Treatment/Interventions Moist Heat;Therapeutic activities;Patient/family education;Passive range of motion;Therapeutic exercise;Ultrasound;Balance training;Gait training;Manual techniques;Neuromuscular re-education;Functional mobility training   PT Next Visit Plan cervical strength in standing with balance exercises, supine cervical strengthening, soft tissue work, heat and stim   PT Home Exercise Plan continue with current HEP   Recommended Other Services None   Consulted and Agree with Plan of Care Patient        Problem List Patient Active Problem List   Diagnosis Date Noted  . Allergic rhinitis 11/04/2013  . Insomnia 11/04/2013  . Senile dementia 10/22/2013  . C7 cervical fracture 10/12/2013  . Obesity (BMI 30-39.9) 04/01/2013  . Osteoarthritis 12/16/2012  . Memory impairment 01/15/2012  . Low back pain 02/13/2011  . Low back strain 02/13/2011  . MERKEL CELL TUMOR 12/14/2009  . OTHER DISEASES OF LUNG NOT ELSEWHERE CLASSIFIED 12/14/2009  . RESTLESS LEG SYNDROME 10/16/2008  . PSYCHOSIS 12/15/2007  . Regional enteritis 12/15/2007  . CEREBROVASCULAR DISEASE 08/12/2007  . COLONIC POLYPS 04/29/2007  . Hypothyroidism 04/29/2007  . BRONCHITIS, RECURRENT 04/29/2007  . DIVERTICULOSIS OF COLON 04/29/2007  . IRRITABLE BOWEL SYNDROME 04/29/2007  . SYNCOPE 04/28/2007  . OBESITY 01/29/2007  . Coronary atherosclerosis 01/29/2007  . ATRIAL FLUTTER 01/29/2007  . GASTROESOPHAGEAL REFLUX DISEASE 01/29/2007  . OSTEOPENIA 01/29/2007  . HEADACHE, MIXED 01/29/2007  . Hyperlipidemia 01/12/2007  . OBSTRUCTIVE SLEEP APNEA 01/12/2007  . Essential hypertension 01/12/2007  . FIBROMYALGIA 01/12/2007  . HYPERPARATHYROIDISM, HX OF 10/21/2006    Ommie Degeorge,PT 04/05/2014, 9:32 AM  [facility] [address] [place], [place], [address] Phone: [phone]   Fax:  [phone]

## 2014-04-07 ENCOUNTER — Ambulatory Visit: Payer: Medicare Other

## 2014-04-07 DIAGNOSIS — M6281 Muscle weakness (generalized): Secondary | ICD-10-CM | POA: Diagnosis not present

## 2014-04-07 DIAGNOSIS — M542 Cervicalgia: Secondary | ICD-10-CM

## 2014-04-07 DIAGNOSIS — G4733 Obstructive sleep apnea (adult) (pediatric): Secondary | ICD-10-CM | POA: Diagnosis not present

## 2014-04-07 DIAGNOSIS — E785 Hyperlipidemia, unspecified: Secondary | ICD-10-CM | POA: Diagnosis not present

## 2014-04-07 DIAGNOSIS — I10 Essential (primary) hypertension: Secondary | ICD-10-CM | POA: Diagnosis not present

## 2014-04-07 DIAGNOSIS — M5382 Other specified dorsopathies, cervical region: Secondary | ICD-10-CM

## 2014-04-07 DIAGNOSIS — I2510 Atherosclerotic heart disease of native coronary artery without angina pectoris: Secondary | ICD-10-CM | POA: Diagnosis not present

## 2014-04-07 NOTE — Therapy (Signed)
[facility] [address], [place], [place], [address] Phone: [phone]   Fax:  [phone]  Physical Therapy Treatment  Patient Details  Name: [name] MRN: [id number] Date of Birth: [birth year] Referring Provider:  [name], MD  Encounter Date: 04/07/2014      PT End of Session - 04/07/14 0914    Visit Number 7   Number of Visits 10  Medicare   Date for PT Re-Evaluation 05/03/14   PT Start Time 0846   PT Stop Time 0930   PT Time Calculation (min) 44 min   Activity Tolerance Patient tolerated treatment well   Behavior During Therapy [facility] for tasks assessed/performed      Past Medical History  Diagnosis Date  . OSA (obstructive sleep apnea)   . History of bronchitis   . Other diseases of lung, not elsewhere classified   . Hypertension   . CAD (coronary artery disease)     LAD 30% 2006  . History of atrial flutter   . Cerebrovascular disease   . Hyperlipemia   . Obesity   . GERD (gastroesophageal reflux disease)   . Diverticulosis of colon   . IBS (irritable bowel syndrome)   . Colonic polyp   . Crohn's disease   . Hypothyroidism   . History of hyperparathyroidism   . Fibromyalgia   . Osteopenia   . History of headache   . Syncope   . Restless legs syndrome (RLS)   . Psychosis   . History of tuberculosis   . Radiation 05/2008    5000 cGy to left lower eyelid, parotid lymphatics and upper neck  . Memory disturbance   . Bipolar affective     History of psychosis  . Merkel cell tumor     Left eyelid    Past Surgical History  Procedure Laterality Date  . Parathyroid adenoma surgery    . Cholecystectomy    . Moh';s surg left lower eye lid surgery and lid reconstruction surgery  2/10  . Bladder suspension    . Cataract extraction Bilateral   . Appendectomy    . Tonsillectomy and adenoidectomy    . Lumbar laminectomy    . Incision and drainage perirectal abscess    . Heel spur  excision    . Nasal sinus surgery Right     Right maxillary  . Eye lid surgery  04/04/2011    [facility] by Dr. Norlene Duel  . Posterior cervical fusion/foraminotomy N/A 10/12/2013    Procedure: Cervical five-Thoracic two cervico-thoracic posterior fusion;  Surgeon: [name], MD;  Location: [place] [facility];  Service: Neurosurgery;  Laterality: N/A;    There were no vitals filed for this visit.  Visit Diagnosis:  Neck muscle weakness  Cervical pain (neck)      Subjective Assessment - 04/07/14 0841    Symptoms I'm having more pain than I have had.  No known cause.  Pt has been walking daily to work on keeping head up.     Currently in Pain? Yes   Pain Score 6    Pain Location Neck   Pain Orientation Right;Left   Pain Descriptors / Indicators Tightness;Aching   Pain Type Chronic pain                       [facility] Adult PT Treatment/Exercise - 04/07/14 0001    Neck Exercises: Seated   Other Seated Exercise seated cervical AROM: 3x20 seconds   Neck  Exercises: Supine   Neck Retraction 5 secs;20 reps   Neck Retraction Limitations added shoulder press 5 second hold x 10   Modalities   Modalities Electrical Stimulation;Moist Heat   Moist Heat Therapy   Number Minutes Moist Heat 20 Minutes   Moist Heat Location Other (comment)  bilateral neck   Electrical Stimulation   Electrical Stimulation Location cervical   Electrical Stimulation Action IFC   Electrical Stimulation Parameters 20   Electrical Stimulation Goals Pain   Manual Therapy   Manual Therapy Massage   Myofascial Release soft tissue work to cervical paraspinals, suboccipitals, scalenes                  PT Short Term Goals - 04/05/14 4627    PT SHORT TERM GOAL #2   Title understand correct HEP for cervical isometrics and low level shoulder exercises   Time 4   Period Weeks   Status Achieved           PT Long Term Goals - 04/05/14 0350    PT LONG TERM GOAL #2   Title increased shoulder  strength >/= 4+/5 making it easier to perfrom daily acitivites   Time 8   Period Weeks   Status --  4/5 for bil. shoulder   PT LONG TERM GOAL #3   Title hold her head up for 5 minutes due to increased cervical strength   Time 8   Period Weeks   Status --  constant VC to hold head up   PT LONG TERM GOAL #4   Title TUG score </= 13 seconds   Time 8   Period Weeks   Status On-going               Plan - 04/07/14 0848    Clinical Impression Statement Pt with increased pain today so no advancement made.  Pt with continued difficulty holding head upright.    Pt will benefit from skilled therapeutic intervention in order to improve on the following deficits Decreased range of motion;Impaired flexibility;Improper body mechanics;Postural dysfunction;Decreased endurance;Decreased activity tolerance;Increased fascial restricitons;Pain;Increased muscle spasms;Decreased balance;Decreased mobility;Decreased strength   Rehab Potential Good   PT Frequency 2x / week   PT Duration 4 weeks   PT Treatment/Interventions Moist Heat;Therapeutic activities;Patient/family education;Passive range of motion;Therapeutic exercise;Ultrasound;Balance training;Gait training;Manual techniques;Neuromuscular re-education;Functional mobility training   PT Next Visit Plan cervical strength in standing with balance exercises, supine cervical strengthening, soft tissue work, heat and stim        Problem List Patient Active Problem List   Diagnosis Date Noted  . Allergic rhinitis 11/04/2013  . Insomnia 11/04/2013  . Senile dementia 10/22/2013  . C7 cervical fracture 10/12/2013  . Obesity (BMI 30-39.9) 04/01/2013  . Osteoarthritis 12/16/2012  . Memory impairment 01/15/2012  . Low back pain 02/13/2011  . Low back strain 02/13/2011  . MERKEL CELL TUMOR 12/14/2009  . OTHER DISEASES OF LUNG NOT ELSEWHERE CLASSIFIED 12/14/2009  . RESTLESS LEG SYNDROME 10/16/2008  . PSYCHOSIS 12/15/2007  . Regional enteritis  12/15/2007  . CEREBROVASCULAR DISEASE 08/12/2007  . COLONIC POLYPS 04/29/2007  . Hypothyroidism 04/29/2007  . BRONCHITIS, RECURRENT 04/29/2007  . DIVERTICULOSIS OF COLON 04/29/2007  . IRRITABLE BOWEL SYNDROME 04/29/2007  . SYNCOPE 04/28/2007  . OBESITY 01/29/2007  . Coronary atherosclerosis 01/29/2007  . ATRIAL FLUTTER 01/29/2007  . GASTROESOPHAGEAL REFLUX DISEASE 01/29/2007  . OSTEOPENIA 01/29/2007  . HEADACHE, MIXED 01/29/2007  . Hyperlipidemia 01/12/2007  . OBSTRUCTIVE SLEEP APNEA 01/12/2007  . Essential hypertension 01/12/2007  .  FIBROMYALGIA 01/12/2007  . HYPERPARATHYROIDISM, HX OF 10/21/2006    Taeshaun Rames, PT 04/07/2014, 9:16 AM  [facility] [address] [place], [place], [address] Phone: [phone]   Fax:  [phone]

## 2014-04-15 ENCOUNTER — Ambulatory Visit: Payer: Medicare Other | Attending: Neurosurgery | Admitting: Physical Therapy

## 2014-04-15 DIAGNOSIS — G4733 Obstructive sleep apnea (adult) (pediatric): Secondary | ICD-10-CM | POA: Insufficient documentation

## 2014-04-15 DIAGNOSIS — K589 Irritable bowel syndrome without diarrhea: Secondary | ICD-10-CM | POA: Insufficient documentation

## 2014-04-15 DIAGNOSIS — M8580 Other specified disorders of bone density and structure, unspecified site: Secondary | ICD-10-CM | POA: Insufficient documentation

## 2014-04-15 DIAGNOSIS — M6281 Muscle weakness (generalized): Secondary | ICD-10-CM | POA: Insufficient documentation

## 2014-04-15 DIAGNOSIS — I10 Essential (primary) hypertension: Secondary | ICD-10-CM | POA: Insufficient documentation

## 2014-04-15 DIAGNOSIS — G2581 Restless legs syndrome: Secondary | ICD-10-CM | POA: Insufficient documentation

## 2014-04-15 DIAGNOSIS — E669 Obesity, unspecified: Secondary | ICD-10-CM | POA: Insufficient documentation

## 2014-04-15 DIAGNOSIS — E785 Hyperlipidemia, unspecified: Secondary | ICD-10-CM | POA: Insufficient documentation

## 2014-04-15 DIAGNOSIS — M797 Fibromyalgia: Secondary | ICD-10-CM | POA: Insufficient documentation

## 2014-04-15 DIAGNOSIS — K5090 Crohn's disease, unspecified, without complications: Secondary | ICD-10-CM | POA: Insufficient documentation

## 2014-04-15 DIAGNOSIS — M542 Cervicalgia: Secondary | ICD-10-CM | POA: Insufficient documentation

## 2014-04-15 DIAGNOSIS — K219 Gastro-esophageal reflux disease without esophagitis: Secondary | ICD-10-CM | POA: Insufficient documentation

## 2014-04-15 DIAGNOSIS — I2510 Atherosclerotic heart disease of native coronary artery without angina pectoris: Secondary | ICD-10-CM | POA: Insufficient documentation

## 2014-04-15 DIAGNOSIS — E039 Hypothyroidism, unspecified: Secondary | ICD-10-CM | POA: Insufficient documentation

## 2014-04-15 DIAGNOSIS — F0390 Unspecified dementia without behavioral disturbance: Secondary | ICD-10-CM | POA: Insufficient documentation

## 2014-04-21 ENCOUNTER — Encounter: Payer: Self-pay | Admitting: Physical Therapy

## 2014-04-21 ENCOUNTER — Ambulatory Visit: Payer: Medicare Other | Admitting: Physical Therapy

## 2014-04-21 DIAGNOSIS — K219 Gastro-esophageal reflux disease without esophagitis: Secondary | ICD-10-CM | POA: Diagnosis not present

## 2014-04-21 DIAGNOSIS — I10 Essential (primary) hypertension: Secondary | ICD-10-CM | POA: Diagnosis not present

## 2014-04-21 DIAGNOSIS — I2510 Atherosclerotic heart disease of native coronary artery without angina pectoris: Secondary | ICD-10-CM | POA: Diagnosis not present

## 2014-04-21 DIAGNOSIS — E669 Obesity, unspecified: Secondary | ICD-10-CM | POA: Diagnosis not present

## 2014-04-21 DIAGNOSIS — E039 Hypothyroidism, unspecified: Secondary | ICD-10-CM | POA: Diagnosis not present

## 2014-04-21 DIAGNOSIS — G2581 Restless legs syndrome: Secondary | ICD-10-CM | POA: Diagnosis not present

## 2014-04-21 DIAGNOSIS — K589 Irritable bowel syndrome without diarrhea: Secondary | ICD-10-CM | POA: Diagnosis not present

## 2014-04-21 DIAGNOSIS — F0390 Unspecified dementia without behavioral disturbance: Secondary | ICD-10-CM | POA: Diagnosis not present

## 2014-04-21 DIAGNOSIS — M797 Fibromyalgia: Secondary | ICD-10-CM | POA: Diagnosis not present

## 2014-04-21 DIAGNOSIS — E785 Hyperlipidemia, unspecified: Secondary | ICD-10-CM | POA: Diagnosis not present

## 2014-04-21 DIAGNOSIS — M6281 Muscle weakness (generalized): Secondary | ICD-10-CM | POA: Diagnosis not present

## 2014-04-21 DIAGNOSIS — M8580 Other specified disorders of bone density and structure, unspecified site: Secondary | ICD-10-CM | POA: Diagnosis not present

## 2014-04-21 DIAGNOSIS — M542 Cervicalgia: Secondary | ICD-10-CM | POA: Diagnosis not present

## 2014-04-21 DIAGNOSIS — K5090 Crohn's disease, unspecified, without complications: Secondary | ICD-10-CM | POA: Diagnosis not present

## 2014-04-21 DIAGNOSIS — G4733 Obstructive sleep apnea (adult) (pediatric): Secondary | ICD-10-CM | POA: Diagnosis not present

## 2014-04-21 NOTE — Therapy (Signed)
[facility] [address] [place], [place], [address] Phone: [phone]   Fax:  [phone]  Physical Therapy Treatment  Patient Details  Name: [name] MRN: [id number] Date of Birth: [birth year] Referring Provider:  [name], MD  Encounter Date: 04/21/2014      PT End of Session - 04/21/14 0901    Visit Number 8   Date for PT Re-Evaluation 05/03/14   PT Start Time 0846   PT Stop Time 0930   PT Time Calculation (min) 44 min   Activity Tolerance Patient tolerated treatment well   Behavior During Therapy [facility] for tasks assessed/performed      Past Medical History  Diagnosis Date  . OSA (obstructive sleep apnea)   . History of bronchitis   . Other diseases of lung, not elsewhere classified   . Hypertension   . CAD (coronary artery disease)     LAD 30% 2006  . History of atrial flutter   . Cerebrovascular disease   . Hyperlipemia   . Obesity   . GERD (gastroesophageal reflux disease)   . Diverticulosis of colon   . IBS (irritable bowel syndrome)   . Colonic polyp   . Crohn's disease   . Hypothyroidism   . History of hyperparathyroidism   . Fibromyalgia   . Osteopenia   . History of headache   . Syncope   . Restless legs syndrome (RLS)   . Psychosis   . History of tuberculosis   . Radiation 05/2008    5000 cGy to left lower eyelid, parotid lymphatics and upper neck  . Memory disturbance   . Bipolar affective     History of psychosis  . Merkel cell tumor     Left eyelid    Past Surgical History  Procedure Laterality Date  . Parathyroid adenoma surgery    . Cholecystectomy    . Moh';s surg left lower eye lid surgery and lid reconstruction surgery  2/10  . Bladder suspension    . Cataract extraction Bilateral   . Appendectomy    . Tonsillectomy and adenoidectomy    . Lumbar laminectomy    . Incision and drainage perirectal abscess    . Heel spur excision    . Nasal sinus surgery Right      Right maxillary  . Eye lid surgery  04/04/2011    [facility] by Dr. Norlene Duel  . Posterior cervical fusion/foraminotomy N/A 10/12/2013    Procedure: Cervical five-Thoracic two cervico-thoracic posterior fusion;  Surgeon: [name], MD;  Location: [place] [facility];  Service: Neurosurgery;  Laterality: N/A;    There were no vitals filed for this visit.  Visit Diagnosis:  Cervical pain (neck)          [facility] PT Assessment - 04/21/14 0001    Observation/Other Assessments   Focus on Therapeutic Outcomes (FOTO)  47% limitation   AROM   Cervical Extension decreased by 100%   Cervical - Right Side Bend decreased by 50%   Cervical - Left Side Bend decreased by 50%   Cervical - Right Rotation decreased by 50%   Cervical - Left Rotation decreased by 50%   Strength   Cervical Extension 4-/5   Cervical - Right Side Bend 4+/5   Cervical - Left Side Bend 4+/5   Cervical - Right Rotation 4+/5   Cervical - Left Rotation 4+/5   Timed Up and Go Test   Normal TUG (seconds) 10   14 sec, 11 sec,  10sec,10 sec                   [facility] Adult PT Treatment/Exercise - 05/18/14 0001    Manual Therapy   Manual Therapy Massage   Myofascial Release soft tissue work to cervical paraspinals, suboccipitals, scalenes                PT Education - 05/18/2014 0924    Education provided Yes   Education Details Reviewed patient past HEP and she is able to return demonstration correctly.    Person(s) Educated Patient   Methods Explanation;Demonstration   Comprehension Verbalized understanding;Returned demonstration          PT Short Term Goals - 04/05/14 0928    PT SHORT TERM GOAL #2   Title understand correct HEP for cervical isometrics and low level shoulder exercises   Time 4   Period Weeks   Status Achieved           PT Long Term Goals - 2014-05-18 2956    PT LONG TERM GOAL #1   Title Cervical pain with daily activities decreased >/= 50%   Time 8   Period Weeks   Status  Achieved   PT LONG TERM GOAL #2   Title increased shoulder strength >/= 4+/5 making it easier to perfrom daily acitivites   Time 8   Period Weeks   Status Achieved   PT LONG TERM GOAL #3   Title hold her head up for 5 minutes due to increased cervical strength   Time 8   Period Weeks   Status Achieved   PT LONG TERM GOAL #4   Title TUG score </= 13 seconds   Time 8   Period Weeks   Status Achieved  10 sec               Plan - 18-May-2014 0925    Clinical Impression Statement Patient has achieved her goals.  Patient is independent with her current HEP.  Patient is ambulating in her home with a walker at times due to feeling unsteady at times.  Patient cervical pain is 50% better.  Patient has improvement with her TUG score.    Pt will benefit from skilled therapeutic intervention in order to improve on the following deficits Decreased range of motion;Impaired flexibility;Improper body mechanics;Postural dysfunction;Decreased endurance;Decreased activity tolerance;Increased fascial restricitons;Pain;Increased muscle spasms;Decreased balance;Decreased mobility;Decreased strength   Rehab Potential Good   Clinical Impairments Affecting Rehab Potential None   PT Treatment/Interventions Moist Heat;Therapeutic activities;Patient/family education;Passive range of motion;Therapeutic exercise;Ultrasound;Balance training;Gait training;Manual techniques;Neuromuscular re-education;Functional mobility training   PT Next Visit Plan Discharged to HEP   PT Home Exercise Plan continue with current HEP   Consulted and Agree with Plan of Care Patient          G-Codes - 05-18-2014 0904    Functional Assessment Tool Used Foto score is 47% limitation   Functional Limitation Carrying, moving and handling objects   Carrying, Moving and Handling Objects Goal Status (O1308) At least 20 percent but less than 40 percent impaired, limited or restricted   Carrying, Moving and Handling Objects Discharge  Status [id number]) At least 40 percent but less than 60 percent impaired, limited or restricted      Problem List Patient Active Problem List   Diagnosis Date Noted  . Allergic rhinitis 11/04/2013  . Insomnia 11/04/2013  . Senile dementia 10/22/2013  . C7 cervical fracture 10/12/2013  . Obesity (BMI 30-39.9) 04/01/2013  . Osteoarthritis 12/16/2012  . Memory  impairment 01/15/2012  . Low back pain 02/13/2011  . Low back strain 02/13/2011  . MERKEL CELL TUMOR 12/14/2009  . OTHER DISEASES OF LUNG NOT ELSEWHERE CLASSIFIED 12/14/2009  . RESTLESS LEG SYNDROME 10/16/2008  . PSYCHOSIS 12/15/2007  . Regional enteritis 12/15/2007  . CEREBROVASCULAR DISEASE 08/12/2007  . COLONIC POLYPS 04/29/2007  . Hypothyroidism 04/29/2007  . BRONCHITIS, RECURRENT 04/29/2007  . DIVERTICULOSIS OF COLON 04/29/2007  . IRRITABLE BOWEL SYNDROME 04/29/2007  . SYNCOPE 04/28/2007  . OBESITY 01/29/2007  . Coronary atherosclerosis 01/29/2007  . ATRIAL FLUTTER 01/29/2007  . GASTROESOPHAGEAL REFLUX DISEASE 01/29/2007  . OSTEOPENIA 01/29/2007  . HEADACHE, MIXED 01/29/2007  . Hyperlipidemia 01/12/2007  . OBSTRUCTIVE SLEEP APNEA 01/12/2007  . Essential hypertension 01/12/2007  . FIBROMYALGIA 01/12/2007  . HYPERPARATHYROIDISM, HX OF 10/21/2006    Ojani Berenson,PT 04/21/2014, 9:30 AM  [facility] [address] [place], [place], [address] Phone: [phone]   Fax:  [phone]  PHYSICAL THERAPY DISCHARGE SUMMARY  Visits from Start of Care: 8 Current functional level related to goals / functional outcomes: See goals update above.  Patient has achieved her goals.    Remaining deficits: Patient understands how to manage her cervical pain.  Patient is walking around her hallways and home with a rolling walker due to feeling unsteady at times.    Education / Equipment: HEP Plan: Patient agrees to discharge.  Patient goals were met. Patient is being  discharged due to meeting the stated rehab goals.  Thank you for the referral. Earlie Counts, PT 04/21/2014 9:30 AM ?????

## 2014-04-22 ENCOUNTER — Encounter: Payer: Medicare Other | Admitting: Physical Therapy

## 2014-04-25 DIAGNOSIS — S12690G Other displaced fracture of seventh cervical vertebra, subsequent encounter for fracture with delayed healing: Secondary | ICD-10-CM | POA: Diagnosis not present

## 2014-04-25 DIAGNOSIS — M542 Cervicalgia: Secondary | ICD-10-CM | POA: Diagnosis not present

## 2014-04-29 ENCOUNTER — Encounter: Payer: Medicare Other | Admitting: Physical Therapy

## 2014-05-03 ENCOUNTER — Ambulatory Visit: Payer: Medicare Other | Admitting: Physical Therapy

## 2014-05-23 ENCOUNTER — Other Ambulatory Visit: Payer: Self-pay | Admitting: Family Medicine

## 2014-05-25 ENCOUNTER — Other Ambulatory Visit: Payer: Self-pay | Admitting: Cardiology

## 2014-06-08 ENCOUNTER — Other Ambulatory Visit: Payer: Self-pay | Admitting: Family Medicine

## 2014-06-24 ENCOUNTER — Encounter: Payer: Self-pay | Admitting: Family Medicine

## 2014-06-24 ENCOUNTER — Ambulatory Visit (INDEPENDENT_AMBULATORY_CARE_PROVIDER_SITE_OTHER): Payer: Medicare Other | Admitting: Family Medicine

## 2014-06-24 VITALS — BP 130/70 | HR 67 | Temp 97.6°F | Wt 156.0 lb

## 2014-06-24 DIAGNOSIS — M6248 Contracture of muscle, other site: Secondary | ICD-10-CM | POA: Diagnosis not present

## 2014-06-24 DIAGNOSIS — M62838 Other muscle spasm: Secondary | ICD-10-CM

## 2014-06-24 MED ORDER — CYCLOBENZAPRINE HCL 5 MG PO TABS: 5.0000 mg | ORAL_TABLET | Freq: Three times a day (TID) | ORAL | Status: DC | PRN

## 2014-06-24 NOTE — Patient Instructions (Signed)

## 2014-06-24 NOTE — Progress Notes (Signed)
Pre visit review using our clinic review tool, if applicable. No additional management support is needed unless otherwise documented below in the visit note. 

## 2014-06-24 NOTE — Progress Notes (Signed)
Subjective:    Patient ID: [name], female    DOB: 04-26-1932, 81 y.o.   MRN: [id number]  HPI Patient has multiple chronic problem's including history of fibromyalgia and remote history of C7 cervical fracture. She is seen today with 4 day history of sore muscles in her neck and trapezius area bilaterally. No injury. She woke up with stiffness. She's tried topical heat and some type of topical rubs without improvement. Restricted range of motion. No recent falls. No headache.  Past Medical History  Diagnosis Date  . OSA (obstructive sleep apnea)   . History of bronchitis   . Other diseases of lung, not elsewhere classified   . Hypertension   . CAD (coronary artery disease)     LAD 30% 2006  . History of atrial flutter   . Cerebrovascular disease   . Hyperlipemia   . Obesity   . GERD (gastroesophageal reflux disease)   . Diverticulosis of colon   . IBS (irritable bowel syndrome)   . Colonic polyp   . Crohn's disease   . Hypothyroidism   . History of hyperparathyroidism   . Fibromyalgia   . Osteopenia   . History of headache   . Syncope   . Restless legs syndrome (RLS)   . Psychosis   . History of tuberculosis   . Radiation 05/2008    5000 cGy to left lower eyelid, parotid lymphatics and upper neck  . Memory disturbance   . Bipolar affective     History of psychosis  . Merkel cell tumor     Left eyelid   Past Surgical History  Procedure Laterality Date  . Parathyroid adenoma surgery    . Cholecystectomy    . Moh';s surg left lower eye lid surgery and lid reconstruction surgery  2/10  . Bladder suspension    . Cataract extraction Bilateral   . Appendectomy    . Tonsillectomy and adenoidectomy    . Lumbar laminectomy    . Incision and drainage perirectal abscess    . Heel spur excision    . Nasal sinus surgery Right     Right maxillary  . Eye lid surgery  04/04/2011    [facility] by Dr. Norlene Duel  . Posterior cervical fusion/foraminotomy N/A 10/12/2013   Procedure: Cervical five-Thoracic two cervico-thoracic posterior fusion;  Surgeon: [name], MD;  Location: [place] [facility];  Service: Neurosurgery;  Laterality: N/A;    reports that she quit smoking about 54 years ago. Her smoking use included Cigarettes. She quit after 6 years of use. She has never used smokeless tobacco. She reports that she does not drink alcohol or use illicit drugs. family history includes Arthritis in her mother; Colon cancer in an other family member; Heart failure in her father; Hypertension in her mother; Pulmonary embolism in an other family member; Ulcers in her mother. Allergies  Allergen Reactions  . Prednisone Other (See Comments)    Patient had psychiatric episode with hallucinations. Caused 3.5 week long hospitalization & loss of memory   . Carbamazepine Nausea Only and Other (See Comments)    Tegretol; dizziness, blurred vision, nausea, headache, insomnia  . Celebrex [Celecoxib] Other (See Comments)    No relief from pain; ineffective  . Codeine Nausea And Vomiting  . Demerol [Meperidine] Nausea And Vomiting  . Diclofenac Sodium Other (See Comments)    severe headache  . Fentanyl Nausea And Vomiting and Other (See Comments)    IV Fentanyl; caused nausea and vomiting Fentanyl patch; caused  chest pain, HBP, with second patch experienced vertigo and nausea  . Fluoxetine Hcl Nausea Only and Other (See Comments)    dizziness, blurred vision, nausea, headache, insomnia  . Hydromorphone Hcl Nausea And Vomiting  . Ibuprofen Other (See Comments)    Caused ulcers and colitis  . Indomethacin Nausea Only and Other (See Comments)    dizziness, blurred vision, nausea, headache, insomnia  . Lisinopril Cough  . Meperidine Hcl Nausea And Vomiting  . Morphine Nausea And Vomiting  . Oruvail [Ketoprofen] Other (See Comments)    No relief from pain; ineffective  . Pregabalin Other (See Comments)    severe restless legs, insomnia  . Propranolol Hcl Nausea Only and Other  (See Comments)    dizziness, blurred vision, nausea, headache, insomnia  . Refresh Lacri-Lube [Artificial Tears] Swelling    Caused swelling, redness, hard crust on eyelids   . Relafen [Nabumetone] Other (See Comments)    No relief from pain; ineffective  . Ropinirole Hcl Other (See Comments)    severe restless legs and insomnia  . Sulfamethoxazole Nausea Only and Other (See Comments)    gas, loose stools  . Bacitracin Hives, Itching and Rash    redness      Review of Systems  Constitutional: Negative for fever and chills.  Respiratory: Negative for shortness of breath.   Cardiovascular: Negative for chest pain.  Skin: Negative for rash.  Neurological: Negative for weakness and headaches.       Objective:   Physical Exam  Constitutional: She appears well-developed and well-nourished.  Cardiovascular: Normal rate and regular rhythm.   Pulmonary/Chest: Effort normal and breath sounds normal. No respiratory distress. She has no wheezes. She has no rales.  Musculoskeletal:  Patient has restricted range of motion with lateral bending or rotation to the right or left. She has fairly good range of motion with neck flexion. She has tremendous muscle tension to palpation paracervical and trapezius muscles bilaterally. Mild tenderness to palpation.          Assessment & Plan:  Neck muscle spasms. She has tried heat and topical rubs without relief. Very cautious trial of Flexeril 5 mg every 8 hours as needed. She is aware of sedation with this. Caution about risk of falls. Consider physical therapy if not improved by next week

## 2014-06-29 ENCOUNTER — Ambulatory Visit: Payer: Medicare Other | Admitting: Family Medicine

## 2014-06-29 DIAGNOSIS — Z0289 Encounter for other administrative examinations: Secondary | ICD-10-CM

## 2014-06-30 ENCOUNTER — Ambulatory Visit (INDEPENDENT_AMBULATORY_CARE_PROVIDER_SITE_OTHER): Payer: Medicare Other | Admitting: Family Medicine

## 2014-06-30 ENCOUNTER — Encounter: Payer: Self-pay | Admitting: Family Medicine

## 2014-06-30 VITALS — BP 130/90 | HR 60 | Temp 98.4°F | Wt 155.0 lb

## 2014-06-30 DIAGNOSIS — R519 Headache, unspecified: Secondary | ICD-10-CM

## 2014-06-30 DIAGNOSIS — M542 Cervicalgia: Secondary | ICD-10-CM | POA: Diagnosis not present

## 2014-06-30 DIAGNOSIS — R51 Headache: Secondary | ICD-10-CM

## 2014-06-30 NOTE — Progress Notes (Signed)
Subjective:    Patient ID: [name], female    DOB: [birth year], 81 y.o.   MRN: [id number]  HPI Patient seen with sharp pain left temporal region she states for the past couple weeks. She did not mention this on last visit when she was seen last week with some diffuse neck stiffness and soreness. She described this as more of a sharp pain with no clear triggering factors. No associated fevers, chills, rash, or any visual changes. No nausea or vomiting. Pain is mild to moderate. No history of similar headache or process. No history of temporal arteritis. No alleviating factors. Patient denies any confusion. No focal weakness.  She took Robaxin for her neck spasms and this did help slightly but she still has some neck stiffness. No fever  Past Medical History  Diagnosis Date  . OSA (obstructive sleep apnea)   . History of bronchitis   . Other diseases of lung, not elsewhere classified   . Hypertension   . CAD (coronary artery disease)     LAD 30% 2006  . History of atrial flutter   . Cerebrovascular disease   . Hyperlipemia   . Obesity   . GERD (gastroesophageal reflux disease)   . Diverticulosis of colon   . IBS (irritable bowel syndrome)   . Colonic polyp   . Crohn's disease   . Hypothyroidism   . History of hyperparathyroidism   . Fibromyalgia   . Osteopenia   . History of headache   . Syncope   . Restless legs syndrome (RLS)   . Psychosis   . History of tuberculosis   . Radiation 05/2008    5000 cGy to left lower eyelid, parotid lymphatics and upper neck  . Memory disturbance   . Bipolar affective     History of psychosis  . Merkel cell tumor     Left eyelid   Past Surgical History  Procedure Laterality Date  . Parathyroid adenoma surgery    . Cholecystectomy    . Moh';s surg left lower eye lid surgery and lid reconstruction surgery  2/10  . Bladder suspension    . Cataract extraction Bilateral   . Appendectomy    . Tonsillectomy and adenoidectomy    . Lumbar  laminectomy    . Incision and drainage perirectal abscess    . Heel spur excision    . Nasal sinus surgery Right     Right maxillary  . Eye lid surgery  04/04/2011    [facility] by Dr. Norlene Duel  . Posterior cervical fusion/foraminotomy N/A 10/12/2013    Procedure: Cervical five-Thoracic two cervico-thoracic posterior fusion;  Surgeon: [name], MD;  Location: [place];  Service: Neurosurgery;  Laterality: N/A;    reports that she quit smoking about 54 years ago. Her smoking use included Cigarettes. She quit after 6 years of use. She has never used smokeless tobacco. She reports that she does not drink alcohol or use illicit drugs. family history includes Arthritis in her mother; Colon cancer in an other family member; Heart failure in her father; Hypertension in her mother; Pulmonary embolism in an other family member; Ulcers in her mother. Allergies  Allergen Reactions  . Prednisone Other (See Comments)    Patient had psychiatric episode with hallucinations. Caused 3.5 week long hospitalization & loss of memory   . Carbamazepine Nausea Only and Other (See Comments)    Tegretol; dizziness, blurred vision, nausea, headache, insomnia  . Celebrex [Celecoxib] Other (See Comments)  No relief from pain; ineffective  . Codeine Nausea And Vomiting  . Demerol [Meperidine] Nausea And Vomiting  . Diclofenac Sodium Other (See Comments)    severe headache  . Fentanyl Nausea And Vomiting and Other (See Comments)    IV Fentanyl; caused nausea and vomiting Fentanyl patch; caused chest pain, HBP, with second patch experienced vertigo and nausea  . Fluoxetine Hcl Nausea Only and Other (See Comments)    dizziness, blurred vision, nausea, headache, insomnia  . Hydromorphone Hcl Nausea And Vomiting  . Ibuprofen Other (See Comments)    Caused ulcers and colitis  . Indomethacin Nausea Only and Other (See Comments)    dizziness, blurred vision, nausea, headache, insomnia  . Lisinopril Cough  .  Meperidine Hcl Nausea And Vomiting  . Morphine Nausea And Vomiting  . Oruvail [Ketoprofen] Other (See Comments)    No relief from pain; ineffective  . Pregabalin Other (See Comments)    severe restless legs, insomnia  . Propranolol Hcl Nausea Only and Other (See Comments)    dizziness, blurred vision, nausea, headache, insomnia  . Refresh Lacri-Lube [Artificial Tears] Swelling    Caused swelling, redness, hard crust on eyelids   . Relafen [Nabumetone] Other (See Comments)    No relief from pain; ineffective  . Ropinirole Hcl Other (See Comments)    severe restless legs and insomnia  . Sulfamethoxazole Nausea Only and Other (See Comments)    gas, loose stools  . Bacitracin Hives, Itching and Rash    redness      Review of Systems  Constitutional: Negative for fever, chills, activity change, fatigue and unexpected weight change.  Eyes: Negative for visual disturbance.  Respiratory: Negative for shortness of breath.   Cardiovascular: Negative for chest pain.  Gastrointestinal: Negative for abdominal pain.  Skin: Negative for rash.  Neurological: Negative for dizziness, seizures and weakness.  Psychiatric/Behavioral: Negative for confusion.       Objective:   Physical Exam  Constitutional: She is oriented to person, place, and time. She appears well-developed and well-nourished.  HENT:  Minimal tenderness left temporal region.  Eyes: EOM are normal.  Patient has had previous left lower lid surgery from skin cancer. No visible rashes peri-ocular region  Neck:  She has some palpable trapezius and paracervical muscle tension bilaterally She has very limited range of motion following neck surgery during the past year  Cardiovascular: Normal rate and regular rhythm.   Pulmonary/Chest: Effort normal and breath sounds normal. No respiratory distress. She has no wheezes. She has no rales.  Neurological: She is alert and oriented to person, place, and time. No cranial nerve deficit.    Gait normal. No focal weakness. Cerebellar function normal  Skin: No rash noted.          Assessment & Plan:   New onset pain localized left temporal region. She does not have any worrisome symptoms such as blurred vision. Tenderness is not localized clearly over the temporal artery region. Very poorly localized. No visible rash to suggest shingles. Check sedimentation rate. Follow-up immediately for any blurred vision

## 2014-06-30 NOTE — Progress Notes (Signed)
Pre visit review using our clinic review tool, if applicable. No additional management support is needed unless otherwise documented below in the visit note. 

## 2014-06-30 NOTE — Patient Instructions (Signed)
Be in touch immediately for any blurred vision, worsening headache or confusion.

## 2014-07-01 LAB — SEDIMENTATION RATE: Sed Rate: 24 mm/hr — ABNORMAL HIGH (ref 0–22)

## 2014-07-12 ENCOUNTER — Other Ambulatory Visit: Payer: Self-pay | Admitting: Family Medicine

## 2014-07-13 ENCOUNTER — Ambulatory Visit (INDEPENDENT_AMBULATORY_CARE_PROVIDER_SITE_OTHER): Payer: Medicare Other | Admitting: Family Medicine

## 2014-07-13 ENCOUNTER — Encounter: Payer: Self-pay | Admitting: Family Medicine

## 2014-07-13 VITALS — BP 130/66 | HR 71 | Temp 98.2°F | Wt 159.0 lb

## 2014-07-13 DIAGNOSIS — K5792 Diverticulitis of intestine, part unspecified, without perforation or abscess without bleeding: Secondary | ICD-10-CM

## 2014-07-13 DIAGNOSIS — J040 Acute laryngitis: Secondary | ICD-10-CM

## 2014-07-13 MED ORDER — METRONIDAZOLE 500 MG PO TABS: 500.0000 mg | ORAL_TABLET | Freq: Three times a day (TID) | ORAL | Status: DC

## 2014-07-13 MED ORDER — CIPROFLOXACIN HCL 500 MG PO TABS: 500.0000 mg | ORAL_TABLET | Freq: Two times a day (BID) | ORAL | Status: DC

## 2014-07-13 NOTE — Patient Instructions (Signed)

## 2014-07-13 NOTE — Progress Notes (Signed)
Pre visit review using our clinic review tool, if applicable. No additional management support is needed unless otherwise documented below in the visit note. 

## 2014-07-13 NOTE — Progress Notes (Signed)
Subjective:    Patient ID: [name], female    DOB: 08/22/1932, 81 y.o.   MRN: [id number]  HPI  patient seen with cough and sinus pressure over the past couple of days. She's had some laryngitis symptoms. No headache. No fevers or chills. Denies any nausea, vomiting, or diarrhea. No history of smoking   Second issue is one-day history of left lower quadrant abdominal pain. No fevers as above. Reported history of diverticulitis and felt similar then. Denies any dysuria. No exacerbating or alleviating factors. Pain is relatively mild.  Past Medical History  Diagnosis Date  . OSA (obstructive sleep apnea)   . History of bronchitis   . Other diseases of lung, not elsewhere classified   . Hypertension   . CAD (coronary artery disease)     LAD 30% 2006  . History of atrial flutter   . Cerebrovascular disease   . Hyperlipemia   . Obesity   . GERD (gastroesophageal reflux disease)   . Diverticulosis of colon   . IBS (irritable bowel syndrome)   . Colonic polyp   . Crohn's disease   . Hypothyroidism   . History of hyperparathyroidism   . Fibromyalgia   . Osteopenia   . History of headache   . Syncope   . Restless legs syndrome (RLS)   . Psychosis   . History of tuberculosis   . Radiation 05/2008    5000 cGy to left lower eyelid, parotid lymphatics and upper neck  . Memory disturbance   . Bipolar affective     History of psychosis  . Merkel cell tumor     Left eyelid   Past Surgical History  Procedure Laterality Date  . Parathyroid adenoma surgery    . Cholecystectomy    . Moh';s surg left lower eye lid surgery and lid reconstruction surgery  2/10  . Bladder suspension    . Cataract extraction Bilateral   . Appendectomy    . Tonsillectomy and adenoidectomy    . Lumbar laminectomy    . Incision and drainage perirectal abscess    . Heel spur excision    . Nasal sinus surgery Right     Right maxillary  . Eye lid surgery  04/04/2011    [facility] by Dr. Norlene Duel  .  Posterior cervical fusion/foraminotomy N/A 10/12/2013    Procedure: Cervical five-Thoracic two cervico-thoracic posterior fusion;  Surgeon: [name], MD;  Location: [place] [facility];  Service: Neurosurgery;  Laterality: N/A;    reports that she quit smoking about 54 years ago. Her smoking use included Cigarettes. She quit after 6 years of use. She has never used smokeless tobacco. She reports that she does not drink alcohol or use illicit drugs. family history includes Arthritis in her mother; Colon cancer in an other family member; Heart failure in her father; Hypertension in her mother; Pulmonary embolism in an other family member; Ulcers in her mother. Allergies  Allergen Reactions  . Prednisone Other (See Comments)    Patient had psychiatric episode with hallucinations. Caused 3.5 week long hospitalization & loss of memory   . Carbamazepine Nausea Only and Other (See Comments)    Tegretol; dizziness, blurred vision, nausea, headache, insomnia  . Celebrex [Celecoxib] Other (See Comments)    No relief from pain; ineffective  . Codeine Nausea And Vomiting  . Demerol [Meperidine] Nausea And Vomiting  . Diclofenac Sodium Other (See Comments)    severe headache  . Fentanyl Nausea And Vomiting and Other (See Comments)  IV Fentanyl; caused nausea and vomiting Fentanyl patch; caused chest pain, HBP, with second patch experienced vertigo and nausea  . Fluoxetine Hcl Nausea Only and Other (See Comments)    dizziness, blurred vision, nausea, headache, insomnia  . Hydromorphone Hcl Nausea And Vomiting  . Ibuprofen Other (See Comments)    Caused ulcers and colitis  . Indomethacin Nausea Only and Other (See Comments)    dizziness, blurred vision, nausea, headache, insomnia  . Lisinopril Cough  . Meperidine Hcl Nausea And Vomiting  . Morphine Nausea And Vomiting  . Oruvail [Ketoprofen] Other (See Comments)    No relief from pain; ineffective  . Pregabalin Other (See Comments)    severe restless  legs, insomnia  . Propranolol Hcl Nausea Only and Other (See Comments)    dizziness, blurred vision, nausea, headache, insomnia  . Refresh Lacri-Lube [Artificial Tears] Swelling    Caused swelling, redness, hard crust on eyelids   . Relafen [Nabumetone] Other (See Comments)    No relief from pain; ineffective  . Ropinirole Hcl Other (See Comments)    severe restless legs and insomnia  . Sulfamethoxazole Nausea Only and Other (See Comments)    gas, loose stools  . Bacitracin Hives, Itching and Rash    redness      Review of Systems  Constitutional: Negative for fever, chills and activity change.  HENT: Positive for congestion. Negative for sore throat.   Respiratory: Positive for cough. Negative for shortness of breath and wheezing.   Cardiovascular: Negative for chest pain.  Gastrointestinal: Positive for abdominal pain. Negative for nausea, vomiting, diarrhea and blood in stool.  Neurological: Positive for headaches. Negative for dizziness.       Objective:   Physical Exam  Constitutional: She appears well-developed and well-nourished.  HENT:  Mouth/Throat: Oropharynx is clear and moist.  Cardiovascular: Normal rate and regular rhythm.   Pulmonary/Chest: Effort normal and breath sounds normal. No respiratory distress. She has no wheezes. She has no rales.  Abdominal: Soft. Bowel sounds are normal. She exhibits no distension and no mass. There is tenderness. There is no rebound and no guarding.  Minimally tender left lower quadrant to deep palpation          Assessment & Plan:  #1 abdominal pain left lower quadrant. Suspect acute diverticulitis. History of similar symptoms in the past. Cipro 500 mg twice a day for 10 days. Flagyl 500 mg 3 times a day for 10 days. Stay well-hydrated. Follow-up for any worsening symptoms #2 laryngitis. Suspect viral. Nonfocal exam. Treat symptomatically

## 2014-07-14 ENCOUNTER — Ambulatory Visit (INDEPENDENT_AMBULATORY_CARE_PROVIDER_SITE_OTHER): Payer: Medicare Other | Admitting: Family Medicine

## 2014-07-14 ENCOUNTER — Encounter: Payer: Self-pay | Admitting: Family Medicine

## 2014-07-14 VITALS — BP 128/60 | HR 74 | Temp 98.2°F | Ht 62.0 in

## 2014-07-14 DIAGNOSIS — K5732 Diverticulitis of large intestine without perforation or abscess without bleeding: Secondary | ICD-10-CM

## 2014-07-14 DIAGNOSIS — J988 Other specified respiratory disorders: Secondary | ICD-10-CM

## 2014-07-14 MED ORDER — LEVOFLOXACIN 750 MG PO TABS: 750.0000 mg | ORAL_TABLET | Freq: Every day | ORAL | Status: DC

## 2014-07-14 NOTE — Progress Notes (Signed)
Pre visit review using our clinic review tool, if applicable. No additional management support is needed unless otherwise documented below in the visit note. Patient declines weight measurement today.

## 2014-07-14 NOTE — Patient Instructions (Signed)
Stop the ciprofloxacin  Start levaquin instead  Follow up in about 5-7 days or sooner if worsening

## 2014-07-14 NOTE — Progress Notes (Signed)
HPI:  Acute visit for laryngitis: -her main concern is her laryngitis, and productive cough - thick clear mucus today -walked in for appt and nurse check pulse ox and 02 was a little low -seen by PCP yesterday and complained of several days of cough, congestion, laryngitis and incidentally LLQ pain with hx of diverticulitis -started on cipro and flagyl for ? Mild diverticulitis, with supportive care for suspected VURI -today reports: mild fever, ? Low O2, worsening cough -denies: SOB, hemoptysis, nausea, vomiting, CP, palpitations, today denies stomach pain -hx Crohns on mesalamine  ROS: See pertinent positives and negatives per HPI.  Past Medical History  Diagnosis Date  . OSA (obstructive sleep apnea)   . History of bronchitis   . Other diseases of lung, not elsewhere classified   . Hypertension   . CAD (coronary artery disease)     LAD 30% 2006  . History of atrial flutter   . Cerebrovascular disease   . Hyperlipemia   . Obesity   . GERD (gastroesophageal reflux disease)   . Diverticulosis of colon   . IBS (irritable bowel syndrome)   . Colonic polyp   . Crohn's disease   . Hypothyroidism   . History of hyperparathyroidism   . Fibromyalgia   . Osteopenia   . History of headache   . Syncope   . Restless legs syndrome (RLS)   . Psychosis   . History of tuberculosis   . Radiation 05/2008    5000 cGy to left lower eyelid, parotid lymphatics and upper neck  . Memory disturbance   . Bipolar affective     History of psychosis  . Merkel cell tumor     Left eyelid    Past Surgical History  Procedure Laterality Date  . Parathyroid adenoma surgery    . Cholecystectomy    . Moh';s surg left lower eye lid surgery and lid reconstruction surgery  2/10  . Bladder suspension    . Cataract extraction Bilateral   . Appendectomy    . Tonsillectomy and adenoidectomy    . Lumbar laminectomy    . Incision and drainage perirectal abscess    . Heel spur excision    . Nasal  sinus surgery Right     Right maxillary  . Eye lid surgery  04/04/2011    [facility] by Dr. Norlene Duel  . Posterior cervical fusion/foraminotomy N/A 10/12/2013    Procedure: Cervical five-Thoracic two cervico-thoracic posterior fusion;  Surgeon: [name], MD;  Location: [facility] [facility];  Service: Neurosurgery;  Laterality: N/A;    Family History  Problem Relation Age of Onset  . Colon cancer      grandmother  . Arthritis Mother   . Hypertension Mother   . Ulcers Mother   . Heart failure Father   . Pulmonary embolism      History   Social History  . Marital Status: Married    Spouse Name: Wille Glaser  . Number of Children: 2  . Years of Education: N/A   Occupational History  .     Social History Main Topics  . Smoking status: Former Smoker -- 6 years    Types: Cigarettes    Quit date: 01/08/1960  . Smokeless tobacco: Never Used  . Alcohol Use: No  . Drug Use: No  . Sexual Activity: Not on file   Other Topics Concern  . None   Social History Narrative   Patient is married with 2 children   Patient is right handed  Patient is a retired Marine scientist   Patient does not drink any caffeine.     Current outpatient prescriptions:  .  aspirin EC 81 MG tablet, Take 81 mg by mouth every evening., Disp: , Rfl:  .  atorvastatin (LIPITOR) 40 MG tablet, TAKE 1 TABLET DAILY, Disp: 90 tablet, Rfl: 2 .  Biotin 5000 MCG TABS, Take 5,000 mcg by mouth every morning. , Disp: , Rfl:  .  Calcium-Magnesium 500-250 MG TABS, Take 2 tablets by mouth daily at 6 PM. , Disp: , Rfl:  .  carvedilol (COREG) 25 MG tablet, TAKE 1 TABLET TWICE A DAY WITH MEALS (NEED APPOINTMENT FOR REFILLS, CALL [phone]), Disp: 180 tablet, Rfl: 0 .  Cholecalciferol (VITAMIN D3) 2000 UNITS TABS, Take 2,000 Units by mouth daily at 12 noon. , Disp: , Rfl:  .  cholestyramine (QUESTRAN) 4 GM/DOSE powder, Take 4 g by mouth daily with breakfast. 2 scoops in water at 7 am, Disp: , Rfl:  .  co-enzyme Q-10 50 MG capsule, Take 50 mg by  mouth daily at 6 PM. , Disp: , Rfl:  .  cyclobenzaprine (FLEXERIL) 5 MG tablet, Take 1 tablet (5 mg total) by mouth 3 (three) times daily as needed for muscle spasms., Disp: 30 tablet, Rfl: 0 .  donepezil (ARICEPT) 5 MG tablet, Take 10 mg by mouth daily. Take 2 tablets (10 mg) at 7 am, Disp: , Rfl:  .  Evening Primrose Oil 500 MG CAPS, Take 500 mg by mouth 2 (two) times daily. , Disp: , Rfl:  .  fluticasone (FLONASE) 50 MCG/ACT nasal spray, Place 2 sprays into both nostrils at bedtime., Disp: , Rfl:  .  guaifenesin (HUMIBID E) 400 MG TABS, Take 400 mg by mouth 2 (two) times daily. , Disp: , Rfl:  .  ketotifen (THERA TEARS ALLERGY) 0.025 % ophthalmic solution, Place 1 drop into both eyes daily as needed (dry eyes)., Disp: , Rfl:  .  levothyroxine (SYNTHROID, LEVOTHROID) 88 MCG tablet, TAKE 1 TABLET DAILY BEFORE BREAKFAST, Disp: 90 tablet, Rfl: 0 .  lidocaine (LIDODERM) 5 %, Place 1 patch onto the skin every 12 (twelve) hours as needed (back pain). Remove & Discard patch within 12 hours or as directed by MD, Disp: , Rfl:  .  mesalamine (LIALDA) 1.2 G EC tablet, Take 3.6 g by mouth daily with lunch. , Disp: , Rfl:  .  metroNIDAZOLE (FLAGYL) 500 MG tablet, Take 1 tablet (500 mg total) by mouth 3 (three) times daily., Disp: 30 tablet, Rfl: 0 .  Multiple Vitamins-Minerals (SUPER VITA-MINS PO), Take 1 tablet by mouth daily at 12 noon. , Disp: , Rfl:  .  Omega 3 1000 MG CAPS, Take 1,000 mg by mouth 2 (two) times daily., Disp: , Rfl:  .  ondansetron (ZOFRAN) 4 MG tablet, Take 1 tablet (4 mg total) by mouth every 8 (eight) hours as needed for nausea or vomiting., Disp: 12 tablet, Rfl: 0 .  pramipexole (MIRAPEX) 0.5 MG tablet, TAKE 1 TABLET TWICE A DAY, Disp: 180 tablet, Rfl: 0 .  PROBIOTIC CAPS, Take 1 capsule by mouth daily., Disp: , Rfl:  .  Simethicone (PHAZYME PO), Take 1-2 tablets by mouth 2 (two) times daily. Take 1 tablet at noon and 2 tablets at 4pm, Disp: , Rfl:  .  traMADol (ULTRAM) 50 MG tablet,  Take 1 tablet (50 mg total) by mouth every 6 (six) hours as needed., Disp: 90 tablet, Rfl: 2 .  vitamin C (ASCORBIC ACID) 500 MG tablet, Take  500 mg by mouth daily at 12 noon. , Disp: , Rfl:  .  levofloxacin (LEVAQUIN) 750 MG tablet, Take 1 tablet (750 mg total) by mouth daily., Disp: 7 tablet, Rfl: 0  EXAM:  Filed Vitals:   07/14/14 1400  BP: 128/60  Pulse: 74  Temp: 98.2 F (36.8 C)    There is no weight on file to calculate BMI.  GENERAL: vitals reviewed and listed above, alert, oriented, appears well hydrated and in no acute distress  HEENT: atraumatic, conjunttiva clear, no obvious abnormalities on inspection of external nose and ears, normal appearance of ear canals and TMs, clear nasal congestion, mild post oropharyngeal erythema with PND, no tonsillar edema or exudate, no sinus TTP  NECK: no obvious masses on inspection  LUNGS: clear to auscultation bilaterally, no wheezes, rales or rhonchi, good air movement  CV: HRRR, no peripheral edema  ABD: BS+, soft, NTTP today  MS: moves all extremities without noticeable abnormality  PSYCH: pleasant and cooperative, no obvious depression or anxiety  ASSESSMENT AND PLAN:  Discussed the following assessment and plan:  Respiratory infection - Plan: levofloxacin (LEVAQUIN) 750 MG tablet  Diverticulitis of colon  -with worsening cough and ? Fluctuating hypoxia opted to change to levaquin given her PMH, fraility and going into weekend for good lung coverage -O2 and exam ok now -follow up in 5-7 days to recheck -Patient advised to return or notify a doctor immediately if symptoms worsen or persist or new concerns arise.  Patient Instructions  Stop the ciprofloxacin  Start levaquin instead  Follow up in about 5-7 days or sooner if worsening      KIM, HANNAH R.

## 2014-07-15 ENCOUNTER — Ambulatory Visit: Payer: Medicare Other | Admitting: Family Medicine

## 2014-07-19 ENCOUNTER — Ambulatory Visit (INDEPENDENT_AMBULATORY_CARE_PROVIDER_SITE_OTHER): Payer: Medicare Other | Admitting: Family

## 2014-07-19 ENCOUNTER — Other Ambulatory Visit: Payer: Self-pay | Admitting: Family Medicine

## 2014-07-19 ENCOUNTER — Encounter: Payer: Self-pay | Admitting: Family

## 2014-07-19 ENCOUNTER — Telehealth: Payer: Self-pay | Admitting: Family Medicine

## 2014-07-19 VITALS — BP 158/82 | Temp 97.9°F | Ht 62.0 in | Wt 159.9 lb

## 2014-07-19 DIAGNOSIS — R1032 Left lower quadrant pain: Secondary | ICD-10-CM

## 2014-07-19 DIAGNOSIS — K5732 Diverticulitis of large intestine without perforation or abscess without bleeding: Secondary | ICD-10-CM

## 2014-07-19 MED ORDER — LEVOFLOXACIN 750 MG PO TABS
750.0000 mg | ORAL_TABLET | Freq: Every day | ORAL | Status: DC
Start: 2014-07-19 — End: 2014-09-30

## 2014-07-19 MED ORDER — LEVOTHYROXINE SODIUM 88 MCG PO TABS: 88.0000 ug | ORAL_TABLET | Freq: Every day | ORAL | Status: DC

## 2014-07-19 MED ORDER — METRONIDAZOLE 500 MG PO TABS: 500.0000 mg | ORAL_TABLET | Freq: Three times a day (TID) | ORAL | Status: DC

## 2014-07-19 NOTE — Progress Notes (Signed)
Pre visit review using our clinic review tool, if applicable. No additional management support is needed unless otherwise documented below in the visit note. 

## 2014-07-19 NOTE — Progress Notes (Signed)
Subjective:    Patient ID: [name], female    DOB: 15-Nov-1932, 81 y.o.   MRN: [id number]  HPI 81 year old white female, nonsmoker with a history of diverticulitis is in today with complaints of left lower abdominal pain 2 days. Describes the pain as a constant dull ache that she rates a 6 out of 10. Worse with movement. In the past has taken Flagyl and Levaquin that is helped. Denies any fever or chills. No blood in her stools are dark black stools.   Review of Systems  Constitutional: Negative.   HENT: Negative.   Respiratory: Negative.   Cardiovascular: Negative.   Gastrointestinal: Positive for abdominal pain. Negative for abdominal distention.  Endocrine: Negative.   Genitourinary: Negative.   Musculoskeletal: Negative.   Skin: Negative.   Allergic/Immunologic: Negative.   Neurological: Negative.   Hematological: Negative.   Psychiatric/Behavioral: Negative.    Past Medical History  Diagnosis Date  . OSA (obstructive sleep apnea)   . History of bronchitis   . Other diseases of lung, not elsewhere classified   . Hypertension   . CAD (coronary artery disease)     LAD 30% 2006  . History of atrial flutter   . Cerebrovascular disease   . Hyperlipemia   . Obesity   . GERD (gastroesophageal reflux disease)   . Diverticulosis of colon   . IBS (irritable bowel syndrome)   . Colonic polyp   . Crohn's disease   . Hypothyroidism   . History of hyperparathyroidism   . Fibromyalgia   . Osteopenia   . History of headache   . Syncope   . Restless legs syndrome (RLS)   . Psychosis   . History of tuberculosis   . Radiation 05/2008    5000 cGy to left lower eyelid, parotid lymphatics and upper neck  . Memory disturbance   . Bipolar affective     History of psychosis  . Merkel cell tumor     Left eyelid    History   Social History  . Marital Status: Married    Spouse Name: Wille Glaser  . Number of Children: 2  . Years of Education: N/A   Occupational History  .      Social History Main Topics  . Smoking status: Former Smoker -- 6 years    Types: Cigarettes    Quit date: 01/08/1960  . Smokeless tobacco: Never Used  . Alcohol Use: No  . Drug Use: No  . Sexual Activity: Not on file   Other Topics Concern  . Not on file   Social History Narrative   Patient is married with 2 children   Patient is right handed   Patient is a retired [employer] scientist   Patient does not drink any caffeine.    Past Surgical History  Procedure Laterality Date  . Parathyroid adenoma surgery    . Cholecystectomy    . Moh';s surg left lower eye lid surgery and lid reconstruction surgery  2/10  . Bladder suspension    . Cataract extraction Bilateral   . Appendectomy    . Tonsillectomy and adenoidectomy    . Lumbar laminectomy    . Incision and drainage perirectal abscess    . Heel spur excision    . Nasal sinus surgery Right     Right maxillary  . Eye lid surgery  04/04/2011    [facility] by Dr. Norlene Duel  . Posterior cervical fusion/foraminotomy N/A 10/12/2013    Procedure: Cervical five-Thoracic two cervico-thoracic posterior fusion;  Surgeon: [name], MD;  Location: [place];  Service: Neurosurgery;  Laterality: N/A;    Family History  Problem Relation Age of Onset  . Colon cancer      grandmother  . Arthritis Mother   . Hypertension Mother   . Ulcers Mother   . Heart failure Father   . Pulmonary embolism      Allergies  Allergen Reactions  . Prednisone Other (See Comments)    Patient had psychiatric episode with hallucinations. Caused 3.5 week long hospitalization & loss of memory   . Carbamazepine Nausea Only and Other (See Comments)    Tegretol; dizziness, blurred vision, nausea, headache, insomnia  . Celebrex [Celecoxib] Other (See Comments)    No relief from pain; ineffective  . Codeine Nausea And Vomiting  . Demerol [Meperidine] Nausea And Vomiting  . Diclofenac Sodium Other (See Comments)    severe headache  . Fentanyl Nausea And Vomiting and  Other (See Comments)    IV Fentanyl; caused nausea and vomiting Fentanyl patch; caused chest pain, HBP, with second patch experienced vertigo and nausea  . Fluoxetine Hcl Nausea Only and Other (See Comments)    dizziness, blurred vision, nausea, headache, insomnia  . Hydromorphone Hcl Nausea And Vomiting  . Ibuprofen Other (See Comments)    Caused ulcers and colitis  . Indomethacin Nausea Only and Other (See Comments)    dizziness, blurred vision, nausea, headache, insomnia  . Lisinopril Cough  . Meperidine Hcl Nausea And Vomiting  . Morphine Nausea And Vomiting  . Oruvail [Ketoprofen] Other (See Comments)    No relief from pain; ineffective  . Pregabalin Other (See Comments)    severe restless legs, insomnia  . Propranolol Hcl Nausea Only and Other (See Comments)    dizziness, blurred vision, nausea, headache, insomnia  . Refresh Lacri-Lube [Artificial Tears] Swelling    Caused swelling, redness, hard crust on eyelids   . Relafen [Nabumetone] Other (See Comments)    No relief from pain; ineffective  . Ropinirole Hcl Other (See Comments)    severe restless legs and insomnia  . Sulfamethoxazole Nausea Only and Other (See Comments)    gas, loose stools  . Bacitracin Hives, Itching and Rash    redness    Current Outpatient Prescriptions on File Prior to Visit  Medication Sig Dispense Refill  . aspirin EC 81 MG tablet Take 81 mg by mouth every evening.    Marland Kitchen atorvastatin (LIPITOR) 40 MG tablet TAKE 1 TABLET DAILY 90 tablet 2  . Biotin 5000 MCG TABS Take 5,000 mcg by mouth every morning.     . Calcium-Magnesium 500-250 MG TABS Take 2 tablets by mouth daily at 6 PM.     . carvedilol (COREG) 25 MG tablet TAKE 1 TABLET TWICE A DAY WITH MEALS (NEED APPOINTMENT FOR REFILLS, CALL [phone]) 180 tablet 0  . Cholecalciferol (VITAMIN D3) 2000 UNITS TABS Take 2,000 Units by mouth daily at 12 noon.     . cholestyramine (QUESTRAN) 4 GM/DOSE powder Take 4 g by mouth daily with breakfast. 2  scoops in water at 7 am    . co-enzyme Q-10 50 MG capsule Take 50 mg by mouth daily at 6 PM.     . cyclobenzaprine (FLEXERIL) 5 MG tablet Take 1 tablet (5 mg total) by mouth 3 (three) times daily as needed for muscle spasms. 30 tablet 0  . Evening Primrose Oil 500 MG CAPS Take 500 mg by mouth 2 (two) times daily.     . fluticasone (  FLONASE) 50 MCG/ACT nasal spray Place 2 sprays into both nostrils at bedtime.    Marland Kitchen guaifenesin (HUMIBID E) 400 MG TABS Take 400 mg by mouth 2 (two) times daily.     Marland Kitchen ketotifen (THERA TEARS ALLERGY) 0.025 % ophthalmic solution Place 1 drop into both eyes daily as needed (dry eyes).    Marland Kitchen levothyroxine (SYNTHROID, LEVOTHROID) 88 MCG tablet TAKE 1 TABLET DAILY BEFORE BREAKFAST 90 tablet 0  . lidocaine (LIDODERM) 5 % Place 1 patch onto the skin every 12 (twelve) hours as needed (back pain). Remove & Discard patch within 12 hours or as directed by MD    . mesalamine (LIALDA) 1.2 G EC tablet Take 3.6 g by mouth daily with lunch.     . Multiple Vitamins-Minerals (SUPER VITA-MINS PO) Take 1 tablet by mouth daily at 12 noon.     . Omega 3 1000 MG CAPS Take 1,000 mg by mouth 2 (two) times daily.    . ondansetron (ZOFRAN) 4 MG tablet Take 1 tablet (4 mg total) by mouth every 8 (eight) hours as needed for nausea or vomiting. 12 tablet 0  . pramipexole (MIRAPEX) 0.5 MG tablet TAKE 1 TABLET TWICE A DAY 180 tablet 0  . PROBIOTIC CAPS Take 1 capsule by mouth daily.    . traMADol (ULTRAM) 50 MG tablet Take 1 tablet (50 mg total) by mouth every 6 (six) hours as needed. 90 tablet 2  . vitamin C (ASCORBIC ACID) 500 MG tablet Take 500 mg by mouth daily at 12 noon.     . donepezil (ARICEPT) 5 MG tablet Take 10 mg by mouth daily. Take 2 tablets (10 mg) at 7 am    . Simethicone (PHAZYME PO) Take 1-2 tablets by mouth 2 (two) times daily. Take 1 tablet at noon and 2 tablets at 4pm     No current facility-administered medications on file prior to visit.    BP 158/82 mmHg  Temp(Src) 97.9 F  (36.6 C) (Oral)  Ht 5\' 2"  (1.575 m)  Wt 159 lb 14.4 oz (72.53 kg)  BMI 29.24 kg/m2chart    Objective:   Physical Exam  Constitutional: She appears well-developed and well-nourished.  Cardiovascular: Normal rate, regular rhythm and normal heart sounds.   Pulmonary/Chest: Effort normal and breath sounds normal.  Abdominal: Soft. There is tenderness. There is rebound and guarding.            Assessment & Plan:  Nayomi was seen today for lower abdominal pain.  Diagnoses and all orders for this visit:  Diverticulitis of colon  Abdominal pain, left lower quadrant  Other orders -     metroNIDAZOLE (FLAGYL) 500 MG tablet; Take 1 tablet (500 mg total) by mouth 3 (three) times daily. -     levofloxacin (LEVAQUIN) 750 MG tablet; Take 1 tablet (750 mg total) by mouth daily.   Call the office with any questions or concerns. Recheck as scheduled and sooner as needed.

## 2014-07-19 NOTE — Patient Instructions (Signed)

## 2014-07-19 NOTE — Telephone Encounter (Signed)
Has appointment today to see Dutch Quint at 10:15

## 2014-07-19 NOTE — Telephone Encounter (Signed)
Patient Name: AALAIYAH YASSIN DOB: 1932/10/20 Initial Comment Caller states wife has HX of Diverticulitis, has been in severe pain all night. Nurse Assessment Nurse: Marcelline Deist, RN, Lynda Date/Time (Eastern Time): 07/19/2014 8:13:32 AM Confirm and document reason for call. If symptomatic, describe symptoms. ---Caller states wife has a hx of Diverticulitis, has been in severe pain all night in lower abdomen, more on left side. No fever. Is on rx for this & using heat pad. Has the patient traveled out of the country within the last 30 days? ---Not Applicable Does the patient require triage? ---Yes Related visit to physician within the last 2 weeks? ---Yes Does the PT have any chronic conditions? (i.e. diabetes, asthma, etc.) ---Yes List chronic conditions. ---diverticulitis Guidelines Guideline Title Affirmed Question Affirmed Notes Abdominal Pain - Female [1] MILD-MODERATE pain AND [2] constant AND [3] present > 2 hours Final Disposition User See Physician within 4 Hours (or PCP triage) Marcelline Deist, RN, Lynda Comments Caller states she is taking Levoflaxacin & Metronidazole (?) for the diverticulitis. Has been dealing with this for a couple months. Referrals REFERRED TO PCP OFFICE REFERRED TO PCP OFFICE Disagree/Comply: Comply

## 2014-07-21 ENCOUNTER — Encounter: Payer: Self-pay | Admitting: Family Medicine

## 2014-07-21 ENCOUNTER — Ambulatory Visit (INDEPENDENT_AMBULATORY_CARE_PROVIDER_SITE_OTHER): Payer: Medicare Other | Admitting: Family Medicine

## 2014-07-21 VITALS — BP 138/68 | HR 77 | Temp 98.2°F | Ht 62.0 in | Wt 159.4 lb

## 2014-07-21 DIAGNOSIS — K5792 Diverticulitis of intestine, part unspecified, without perforation or abscess without bleeding: Secondary | ICD-10-CM | POA: Diagnosis not present

## 2014-07-21 NOTE — Progress Notes (Signed)
Subjective:    Patient ID: [name], female    DOB: 09-30-1932, 81 y.o.   MRN: [id number]  HPI  Follow-up recent probable acute diverticulitis and upper respiratory infection. Her laryngitis symptoms are clear. Cough is resolved. No fever. Abdominal pain is essentially resolved. She is finishing up course of Levaquin and metronidazole. No nausea or vomiting. No stool changes. Ambulating without difficulty  Past Medical History  Diagnosis Date  . OSA (obstructive sleep apnea)   . History of bronchitis   . Other diseases of lung, not elsewhere classified   . Hypertension   . CAD (coronary artery disease)     LAD 30% 2006  . History of atrial flutter   . Cerebrovascular disease   . Hyperlipemia   . Obesity   . GERD (gastroesophageal reflux disease)   . Diverticulosis of colon   . IBS (irritable bowel syndrome)   . Colonic polyp   . Crohn's disease   . Hypothyroidism   . History of hyperparathyroidism   . Fibromyalgia   . Osteopenia   . History of headache   . Syncope   . Restless legs syndrome (RLS)   . Psychosis   . History of tuberculosis   . Radiation 05/2008    5000 cGy to left lower eyelid, parotid lymphatics and upper neck  . Memory disturbance   . Bipolar affective     History of psychosis  . Merkel cell tumor     Left eyelid   Past Surgical History  Procedure Laterality Date  . Parathyroid adenoma surgery    . Cholecystectomy    . Moh';s surg left lower eye lid surgery and lid reconstruction surgery  2/10  . Bladder suspension    . Cataract extraction Bilateral   . Appendectomy    . Tonsillectomy and adenoidectomy    . Lumbar laminectomy    . Incision and drainage perirectal abscess    . Heel spur excision    . Nasal sinus surgery Right     Right maxillary  . Eye lid surgery  04/04/2011    [facility] by Dr. Norlene Duel  . Posterior cervical fusion/foraminotomy N/A 10/12/2013    Procedure: Cervical five-Thoracic two cervico-thoracic posterior fusion;   Surgeon: [name], MD;  Location: [name] [facility];  Service: Neurosurgery;  Laterality: N/A;    reports that she quit smoking about 54 years ago. Her smoking use included Cigarettes. She quit after 6 years of use. She has never used smokeless tobacco. She reports that she does not drink alcohol or use illicit drugs. family history includes Arthritis in her mother; Colon cancer in an other family member; Heart failure in her father; Hypertension in her mother; Pulmonary embolism in an other family member; Ulcers in her mother. Allergies  Allergen Reactions  . Prednisone Other (See Comments)    Patient had psychiatric episode with hallucinations. Caused 3.5 week long hospitalization & loss of memory   . Carbamazepine Nausea Only and Other (See Comments)    Tegretol; dizziness, blurred vision, nausea, headache, insomnia  . Celebrex [Celecoxib] Other (See Comments)    No relief from pain; ineffective  . Codeine Nausea And Vomiting  . Demerol [Meperidine] Nausea And Vomiting  . Diclofenac Sodium Other (See Comments)    severe headache  . Fentanyl Nausea And Vomiting and Other (See Comments)    IV Fentanyl; caused nausea and vomiting Fentanyl patch; caused chest pain, HBP, with second patch experienced vertigo and nausea  . Fluoxetine Hcl Nausea Only  and Other (See Comments)    dizziness, blurred vision, nausea, headache, insomnia  . Hydromorphone Hcl Nausea And Vomiting  . Ibuprofen Other (See Comments)    Caused ulcers and colitis  . Indomethacin Nausea Only and Other (See Comments)    dizziness, blurred vision, nausea, headache, insomnia  . Lisinopril Cough  . Meperidine Hcl Nausea And Vomiting  . Morphine Nausea And Vomiting  . Oruvail [Ketoprofen] Other (See Comments)    No relief from pain; ineffective  . Pregabalin Other (See Comments)    severe restless legs, insomnia  . Propranolol Hcl Nausea Only and Other (See Comments)    dizziness, blurred vision, nausea, headache, insomnia    . Refresh Lacri-Lube [Artificial Tears] Swelling    Caused swelling, redness, hard crust on eyelids   . Relafen [Nabumetone] Other (See Comments)    No relief from pain; ineffective  . Ropinirole Hcl Other (See Comments)    severe restless legs and insomnia  . Sulfamethoxazole Nausea Only and Other (See Comments)    gas, loose stools  . Bacitracin Hives, Itching and Rash    redness     Review of Systems  Constitutional: Negative for fever, chills and appetite change.  Respiratory: Negative for cough and shortness of breath.   Cardiovascular: Negative for chest pain.  Gastrointestinal: Negative for nausea, vomiting, abdominal pain and diarrhea.  Genitourinary: Negative for dysuria.       Objective:   Physical Exam  Constitutional: She appears well-developed and well-nourished. No distress.  HENT:  Mouth/Throat: Oropharynx is clear and moist.  Neck: Neck supple.  Cardiovascular: Normal rate and regular rhythm.   Pulmonary/Chest: Effort normal and breath sounds normal. No respiratory distress. She has no wheezes. She has no rales.  Abdominal: Soft. She exhibits no mass. There is no tenderness. There is no rebound and no guarding.          Assessment & Plan:  Recent acute diverticulitis resolving. Finish out Levaquin and metronidazole. Follow-up promptly for any fever or recurrent abdominal pain.

## 2014-07-21 NOTE — Progress Notes (Signed)
Pre visit review using our clinic review tool, if applicable. No additional management support is needed unless otherwise documented below in the visit note. 

## 2014-07-21 NOTE — Patient Instructions (Signed)

## 2014-07-25 ENCOUNTER — Telehealth: Payer: Self-pay | Admitting: Family Medicine

## 2014-07-25 NOTE — Telephone Encounter (Signed)
Pt call to ask if Dr [name] will order her a life line

## 2014-07-27 NOTE — Telephone Encounter (Signed)
[place] a message in regards to the life line to see if [facility] does those.

## 2014-07-28 ENCOUNTER — Encounter: Payer: Self-pay | Admitting: Family Medicine

## 2014-07-28 ENCOUNTER — Ambulatory Visit (INDEPENDENT_AMBULATORY_CARE_PROVIDER_SITE_OTHER): Payer: Medicare Other | Admitting: Family Medicine

## 2014-07-28 ENCOUNTER — Other Ambulatory Visit: Payer: Self-pay | Admitting: Family Medicine

## 2014-07-28 ENCOUNTER — Telehealth: Payer: Self-pay

## 2014-07-28 VITALS — BP 140/78 | HR 76 | Temp 98.1°F | Wt 157.0 lb

## 2014-07-28 DIAGNOSIS — R1030 Lower abdominal pain, unspecified: Secondary | ICD-10-CM

## 2014-07-28 DIAGNOSIS — E875 Hyperkalemia: Secondary | ICD-10-CM

## 2014-07-28 LAB — CBC WITH DIFFERENTIAL/PLATELET
BASOS PCT: 0.7 % (ref 0.0–3.0)
Basophils Absolute: 0.1 10*3/uL (ref 0.0–0.1)
Eosinophils Absolute: 0.2 10*3/uL (ref 0.0–0.7)
Eosinophils Relative: 2 % (ref 0.0–5.0)
HEMATOCRIT: 42.2 % (ref 36.0–46.0)
Hemoglobin: 14.1 g/dL (ref 12.0–15.0)
LYMPHS PCT: 34.3 % (ref 12.0–46.0)
Lymphs Abs: 2.9 10*3/uL (ref 0.7–4.0)
MCHC: 33.4 g/dL (ref 30.0–36.0)
MCV: 94.3 fl (ref 78.0–100.0)
Monocytes Absolute: 0.8 10*3/uL (ref 0.1–1.0)
Monocytes Relative: 9.9 % (ref 3.0–12.0)
Neutro Abs: 4.4 10*3/uL (ref 1.4–7.7)
Neutrophils Relative %: 53.1 % (ref 43.0–77.0)
PLATELETS: 187 10*3/uL (ref 150.0–400.0)
RBC: 4.47 Mil/uL (ref 3.87–5.11)
RDW: 14 % (ref 11.5–15.5)
WBC: 8.3 10*3/uL (ref 4.0–10.5)

## 2014-07-28 LAB — BASIC METABOLIC PANEL
BUN: 22 mg/dL (ref 6–23)
CO2: 31 meq/L (ref 19–32)
CREATININE: 0.94 mg/dL (ref 0.40–1.20)
Calcium: 10.1 mg/dL (ref 8.4–10.5)
Chloride: 105 mEq/L (ref 96–112)
GFR: 60.88 mL/min (ref >60.00)
GLUCOSE: 98 mg/dL (ref 70–99)
Potassium: 6.1 mEq/L (ref 3.5–5.1)
Sodium: 139 mEq/L (ref 135–145)

## 2014-07-28 NOTE — Patient Instructions (Signed)

## 2014-07-28 NOTE — Progress Notes (Signed)
Subjective:    Patient ID: [name], female    DOB: [birth year], 81 y.o.   MRN: [id number]  HPI Patient seen with persistent lower abdominal pain. She was first seen here early part of the month with left lower quadrant pain suspicious for acute diverticulitis. Past history of reported diverticulitis. We started Cipro and Flagyl and she was subsequently changed from Cipro to Levaquin because some respiratory symptoms. Her pain is shifted somewhat to the mid abdomen and somewhat right lower quadrant. She denies any fevers or chills. No constipation. No bloody stools. No urinary symptoms whatsoever. She describes a dull pain which is worse with pressure. No pain with ambulation. She brings in her medications and actually still has some Levaquin and Flagyl left -which indicates she has not been taking this appropriately as she should've finished these several days ago. No nausea or vomiting..  No exacerbating factors (other than worse with direct pressure) or alleviating.  Past Medical History  Diagnosis Date  . OSA (obstructive sleep apnea)   . History of bronchitis   . Other diseases of lung, not elsewhere classified   . Hypertension   . CAD (coronary artery disease)     LAD 30% 2006  . History of atrial flutter   . Cerebrovascular disease   . Hyperlipemia   . Obesity   . GERD (gastroesophageal reflux disease)   . Diverticulosis of colon   . IBS (irritable bowel syndrome)   . Colonic polyp   . Crohn's disease   . Hypothyroidism   . History of hyperparathyroidism   . Fibromyalgia   . Osteopenia   . History of headache   . Syncope   . Restless legs syndrome (RLS)   . Psychosis   . History of tuberculosis   . Radiation 05/2008    5000 cGy to left lower eyelid, parotid lymphatics and upper neck  . Memory disturbance   . Bipolar affective     History of psychosis  . Merkel cell tumor     Left eyelid   Past Surgical History  Procedure Laterality Date  . Parathyroid adenoma  surgery    . Cholecystectomy    . Moh';s surg left lower eye lid surgery and lid reconstruction surgery  2/10  . Bladder suspension    . Cataract extraction Bilateral   . Appendectomy    . Tonsillectomy and adenoidectomy    . Lumbar laminectomy    . Incision and drainage perirectal abscess    . Heel spur excision    . Nasal sinus surgery Right     Right maxillary  . Eye lid surgery  04/04/2011    [facility] by Dr. Norlene Duel  . Posterior cervical fusion/foraminotomy N/A 10/12/2013    Procedure: Cervical five-Thoracic two cervico-thoracic posterior fusion;  Surgeon: [name], MD;  Location: [place] [facility];  Service: Neurosurgery;  Laterality: N/A;    reports that she quit smoking about 54 years ago. Her smoking use included Cigarettes. She quit after 6 years of use. She has never used smokeless tobacco. She reports that she does not drink alcohol or use illicit drugs. family history includes Arthritis in her mother; Colon cancer in an other family member; Heart failure in her father; Hypertension in her mother; Pulmonary embolism in an other family member; Ulcers in her mother. Allergies  Allergen Reactions  . Prednisone Other (See Comments)    Patient had psychiatric episode with hallucinations. Caused 3.5 week long hospitalization & loss of memory   .  Carbamazepine Nausea Only and Other (See Comments)    Tegretol; dizziness, blurred vision, nausea, headache, insomnia  . Celebrex [Celecoxib] Other (See Comments)    No relief from pain; ineffective  . Codeine Nausea And Vomiting  . Demerol [Meperidine] Nausea And Vomiting  . Diclofenac Sodium Other (See Comments)    severe headache  . Fentanyl Nausea And Vomiting and Other (See Comments)    IV Fentanyl; caused nausea and vomiting Fentanyl patch; caused chest pain, HBP, with second patch experienced vertigo and nausea  . Fluoxetine Hcl Nausea Only and Other (See Comments)    dizziness, blurred vision, nausea, headache, insomnia  .  Hydromorphone Hcl Nausea And Vomiting  . Ibuprofen Other (See Comments)    Caused ulcers and colitis  . Indomethacin Nausea Only and Other (See Comments)    dizziness, blurred vision, nausea, headache, insomnia  . Lisinopril Cough  . Meperidine Hcl Nausea And Vomiting  . Morphine Nausea And Vomiting  . Oruvail [Ketoprofen] Other (See Comments)    No relief from pain; ineffective  . Pregabalin Other (See Comments)    severe restless legs, insomnia  . Propranolol Hcl Nausea Only and Other (See Comments)    dizziness, blurred vision, nausea, headache, insomnia  . Refresh Lacri-Lube [Artificial Tears] Swelling    Caused swelling, redness, hard crust on eyelids   . Relafen [Nabumetone] Other (See Comments)    No relief from pain; ineffective  . Ropinirole Hcl Other (See Comments)    severe restless legs and insomnia  . Sulfamethoxazole Nausea Only and Other (See Comments)    gas, loose stools  . Bacitracin Hives, Itching and Rash    redness      Review of Systems  Constitutional: Negative for fever, chills and unexpected weight change.  Respiratory: Negative for cough.   Cardiovascular: Negative for chest pain.  Gastrointestinal: Positive for abdominal pain. Negative for nausea, vomiting, diarrhea, constipation, blood in stool and abdominal distention.  Neurological: Negative for dizziness.       Objective:   Physical Exam  Constitutional: She appears well-developed and well-nourished.  Cardiovascular: Normal rate and regular rhythm.   Pulmonary/Chest: Effort normal and breath sounds normal. No respiratory distress. She has no wheezes. She has no rales.  Abdominal: Soft. Bowel sounds are normal. She exhibits no distension and no mass. There is tenderness. There is no rebound and no guarding.          Assessment & Plan:  Abdominal pain which is poorly localized across her lower abdomen. Question of diverticulitis previously. In looking through her medication bottles that  she brings in today, she has not been taking her medications appropriately. She does not have acute abdomen but has now had over 3 weeks of persistent abdominal pain. Needs further clarification. Check CBC. Check CT abdomen and pelvis.

## 2014-07-28 NOTE — Progress Notes (Signed)
Pre visit review using our clinic review tool, if applicable. No additional management support is needed unless otherwise documented below in the visit note. 

## 2014-07-28 NOTE — Telephone Encounter (Signed)
Critical lab: patient K is 6.1 and it is not hemolyzed it was collect at 10:30am

## 2014-07-29 ENCOUNTER — Ambulatory Visit (INDEPENDENT_AMBULATORY_CARE_PROVIDER_SITE_OTHER)
Admission: RE | Admit: 2014-07-29 | Discharge: 2014-07-29 | Disposition: A | Discharge status: Home / Self Care | Payer: Medicare Other | Source: Ambulatory Visit | Attending: Family Medicine | Admitting: Family Medicine

## 2014-07-29 ENCOUNTER — Other Ambulatory Visit: Payer: Medicare Other

## 2014-07-29 DIAGNOSIS — R1030 Lower abdominal pain, unspecified: Secondary | ICD-10-CM

## 2014-07-29 DIAGNOSIS — K439 Ventral hernia without obstruction or gangrene: Secondary | ICD-10-CM | POA: Diagnosis not present

## 2014-07-29 MED ORDER — IOHEXOL 300 MG/ML  SOLN
100.0000 mL | Freq: Once | INTRAMUSCULAR | Status: AC | PRN
  Administered 2014-07-29: 100 mL via INTRAVENOUS

## 2014-08-15 ENCOUNTER — Other Ambulatory Visit: Payer: Self-pay | Admitting: Gynecology

## 2014-08-15 DIAGNOSIS — Z124 Encounter for screening for malignant neoplasm of cervix: Secondary | ICD-10-CM | POA: Diagnosis not present

## 2014-08-15 DIAGNOSIS — Z1212 Encounter for screening for malignant neoplasm of rectum: Secondary | ICD-10-CM | POA: Diagnosis not present

## 2014-08-15 DIAGNOSIS — Z6829 Body mass index (BMI) 29.0-29.9, adult: Secondary | ICD-10-CM | POA: Diagnosis not present

## 2014-08-15 DIAGNOSIS — Z1231 Encounter for screening mammogram for malignant neoplasm of breast: Secondary | ICD-10-CM | POA: Diagnosis not present

## 2014-08-16 LAB — CYTOLOGY - PAP

## 2014-08-20 ENCOUNTER — Other Ambulatory Visit: Payer: Self-pay | Admitting: Family Medicine

## 2014-08-22 ENCOUNTER — Other Ambulatory Visit: Payer: Self-pay

## 2014-08-22 ENCOUNTER — Telehealth: Payer: Self-pay | Admitting: Family Medicine

## 2014-08-22 MED ORDER — DONEPEZIL HCL 5 MG PO TABS: 10.0000 mg | ORAL_TABLET | Freq: Every day | ORAL | Status: DC

## 2014-08-22 NOTE — Telephone Encounter (Signed)
[name] said his wife is out of Aricept.  Wants to know if she needs to keep taking it as weel.  If so, she needs a refill called in to [facility] in [place].

## 2014-08-23 ENCOUNTER — Other Ambulatory Visit: Payer: Self-pay | Admitting: Cardiology

## 2014-08-23 NOTE — Telephone Encounter (Signed)
Rx(s) sent to pharmacy electronically.  

## 2014-09-05 ENCOUNTER — Other Ambulatory Visit: Payer: Self-pay | Admitting: Family Medicine

## 2014-09-09 DIAGNOSIS — Z23 Encounter for immunization: Secondary | ICD-10-CM | POA: Diagnosis not present

## 2014-09-13 ENCOUNTER — Encounter: Payer: Self-pay | Admitting: Family Medicine

## 2014-09-13 ENCOUNTER — Ambulatory Visit (INDEPENDENT_AMBULATORY_CARE_PROVIDER_SITE_OTHER): Payer: Medicare Other | Admitting: Family Medicine

## 2014-09-13 VITALS — BP 130/68 | HR 70 | Temp 97.7°F | Wt 164.0 lb

## 2014-09-13 DIAGNOSIS — M7502 Adhesive capsulitis of left shoulder: Secondary | ICD-10-CM

## 2014-09-13 MED ORDER — METHYLPREDNISOLONE ACETATE 80 MG/ML IJ SUSP
80.0000 mg | Freq: Once | INTRAMUSCULAR | Status: AC
  Administered 2014-09-13: 80 mg via INTRAMUSCULAR

## 2014-09-13 NOTE — Progress Notes (Signed)
Subjective:    Patient ID: [name], female    DOB: 01/19/1932, 81 y.o.   MRN: [id number]  HPI Acute visit for left shoulder pain-at least couple weeks duration and possibly longer. No chest pain. She has some chronic neck pain but this pain is somewhat different. She does have occasional pain at the base of the cervical spine, left but current pain is worse with abduction and she is losing some range of motion. She has minimal pain with internal rotation but mostly abduction. She's taken tramadol without much relief. Not having much night pain currently. Denies any recent injury. She is having difficulties getting dressed secondary to pain. Denies any upper extremity weakness or numbness.  Past Medical History  Diagnosis Date  . OSA (obstructive sleep apnea)   . History of bronchitis   . Other diseases of lung, not elsewhere classified   . Hypertension   . CAD (coronary artery disease)     LAD 30% 2006  . History of atrial flutter   . Cerebrovascular disease   . Hyperlipemia   . Obesity   . GERD (gastroesophageal reflux disease)   . Diverticulosis of colon   . IBS (irritable bowel syndrome)   . Colonic polyp   . Crohn's disease   . Hypothyroidism   . History of hyperparathyroidism   . Fibromyalgia   . Osteopenia   . History of headache   . Syncope   . Restless legs syndrome (RLS)   . Psychosis   . History of tuberculosis   . Radiation 05/2008    5000 cGy to left lower eyelid, parotid lymphatics and upper neck  . Memory disturbance   . Bipolar affective     History of psychosis  . Merkel cell tumor     Left eyelid   Past Surgical History  Procedure Laterality Date  . Parathyroid adenoma surgery    . Cholecystectomy    . Moh';s surg left lower eye lid surgery and lid reconstruction surgery  2/10  . Bladder suspension    . Cataract extraction Bilateral   . Appendectomy    . Tonsillectomy and adenoidectomy    . Lumbar laminectomy    . Incision and drainage  perirectal abscess    . Heel spur excision    . Nasal sinus surgery Right     Right maxillary  . Eye lid surgery  04/04/2011    [facility] by Dr. Norlene Duel  . Posterior cervical fusion/foraminotomy N/A 10/12/2013    Procedure: Cervical five-Thoracic two cervico-thoracic posterior fusion;  Surgeon: [name], MD;  Location: [place] [facility];  Service: Neurosurgery;  Laterality: N/A;    reports that she quit smoking about 54 years ago. Her smoking use included Cigarettes. She quit after 6 years of use. She has never used smokeless tobacco. She reports that she does not drink alcohol or use illicit drugs. family history includes Arthritis in her mother; Colon cancer in an other family member; Heart failure in her father; Hypertension in her mother; Pulmonary embolism in an other family member; Ulcers in her mother. Allergies  Allergen Reactions  . Prednisone Other (See Comments)    Patient had psychiatric episode with hallucinations. Caused 3.5 week long hospitalization & loss of memory   . Carbamazepine Nausea Only and Other (See Comments)    Tegretol; dizziness, blurred vision, nausea, headache, insomnia  . Celebrex [Celecoxib] Other (See Comments)    No relief from pain; ineffective  . Codeine Nausea And Vomiting  . Demerol [  Meperidine] Nausea And Vomiting  . Diclofenac Sodium Other (See Comments)    severe headache  . Fentanyl Nausea And Vomiting and Other (See Comments)    IV Fentanyl; caused nausea and vomiting Fentanyl patch; caused chest pain, HBP, with second patch experienced vertigo and nausea  . Fluoxetine Hcl Nausea Only and Other (See Comments)    dizziness, blurred vision, nausea, headache, insomnia  . Hydromorphone Hcl Nausea And Vomiting  . Ibuprofen Other (See Comments)    Caused ulcers and colitis  . Indomethacin Nausea Only and Other (See Comments)    dizziness, blurred vision, nausea, headache, insomnia  . Lisinopril Cough  . Meperidine Hcl Nausea And Vomiting  . Morphine  Nausea And Vomiting  . Oruvail [Ketoprofen] Other (See Comments)    No relief from pain; ineffective  . Pregabalin Other (See Comments)    severe restless legs, insomnia  . Propranolol Hcl Nausea Only and Other (See Comments)    dizziness, blurred vision, nausea, headache, insomnia  . Refresh Lacri-Lube [Artificial Tears] Swelling    Caused swelling, redness, hard crust on eyelids   . Relafen [Nabumetone] Other (See Comments)    No relief from pain; ineffective  . Ropinirole Hcl Other (See Comments)    severe restless legs and insomnia  . Sulfamethoxazole Nausea Only and Other (See Comments)    gas, loose stools  . Bacitracin Hives, Itching and Rash    redness      Review of Systems  Respiratory: Negative for shortness of breath.   Cardiovascular: Negative for chest pain.  Skin: Negative for rash.  Neurological: Negative for weakness and numbness.  Hematological: Negative for adenopathy.       Objective:   Physical Exam  Constitutional: She appears well-developed and well-nourished.  Cardiovascular: Normal rate and regular rhythm.   Pulmonary/Chest: Effort normal and breath sounds normal. No respiratory distress. She has no wheezes. She has no rales.  Musculoskeletal: She exhibits no edema.  Left shoulder no visible edema. No acromioclavicular tenderness. Mild proximal biceps tenderness. She has some tenderness below the acromion process. She has restricted range of motion. Can abduct about 90. Pain with abduction against resistance and passively. Minimal pain with internal rotation.  Neurological:  Symmetric reflexes throughout upper extremities No definite rotator cuff weakness but testing somewhat challenging because of pain          Assessment & Plan:  Left shoulder pain. Suspect impingement syndrome and probably early adhesive capsulitis. We recommended starting physical therapy and also steroid injection  Discussed risks and benefits of corticosteroid injection  and patient consented.  After prepping skin with betadine, injected 80 mg depomedrol and 2 cc of plain xylocaine with 23 gauge one and one half inch needle using posterior lateral approach and pt tolerated well.

## 2014-09-13 NOTE — Patient Instructions (Signed)
Adhesive Capsulitis Sometimes the shoulder becomes stiff and is painful to move. Some people say it feels as if the shoulder is frozen in place. Because of this, the condition is called "frozen shoulder." Its medical name is adhesive capsulitis.  The shoulder joint is made up of strong connective tissue that attaches the ball of the humerus to the shallow shoulder socket. This strong connective tissue is called the joint capsule. This tissue can become stiff and swollen. That is when adhesive capsulitis sets in. CAUSES  It is not always clear just what the cause adhesive capsulitis. Possibilities include:  Injury to the shoulder joint.  Strain. This is a repetitive injury brought about by overuse.  Lack of use. Perhaps your arm or hand was otherwise injured. It might have been in a sling for awhile. Or perhaps you were not using it to avoid pain.  Referred pain. This is a sort of trick the body plays. You feel pain in the shoulder. But, the pain actually comes from an injury somewhere else in the body.  Long-standing health problems. Several diseases can cause adhesive capsulitis. They include diabetes, heart disease, stroke, thyroid problems, rheumatoid arthritis and lung disease.  Being a women older than 53. Anyone can develop adhesive capsulitis but it is most common in women in this age group. SYMPTOMS   Pain.  It occurs when the arm is moved.  Parts of the shoulder might hurt if they are touched.  Pain is worse at night or when resting.  Soreness. It might not be strong enough to be called pain. But, the shoulder aches.  The shoulder does not move freely.  Muscle spasms.  Trouble sleeping because of shoulder ache or pain. DIAGNOSIS  To decide if you have adhesive capsulitis, your healthcare provider will probably:  Ask about symptoms you have noticed.  Ask about your history of joint pain and anything that might have caused the pain.  Ask about your overall  health.  Use hands to feel your shoulder and neck.  Ask you to move your shoulder in specific directions. This may indicate the origin of the pain.  Order imaging tests; pictures of the shoulder. They help pinpoint the source of the problem. An X-ray might be used. For more detail, an MRI is often used. An MRI details the tendons, muscles and ligaments as well as the joint. TREATMENT  Adhesive capsulitis can be treated several ways. Most treatments can be done in a clinic or in your healthcare provider's office. Be sure to discuss the different options with your caregiver. They include:  Physical therapy. You will work on specific exercises to get your shoulder moving again. The exercises usually involve stretching. A physical therapist (a caregiver with special training) can show you what to do and what not to do. The exercises will need to be done daily.  Medication.  Over-the-counter medicines may relieve pain and inflammation (the body's way of reacting to injury or infection).  Corticosteroids. These are stronger drugs to reduce pain and inflammation. They are given by injection (shots) into the shoulder joint. Frequent treatment is not recommended.  Muscle relaxants. Medication may be prescribed to ease muscle spasms.  Treatment of underlying conditions. This means treating another condition that is causing your shoulder problem. This might be a rotator cuff (tendon) problem  Shoulder manipulation. The shoulder will be moved by your healthcare provider. You would be under general anesthesia (given a drug that puts you to sleep). You would not feel anything. Sometimes  the joint will be injected with salt water (saline) at high pressure to break down internal scarring in the joint capsule.  Surgery. This is rarely needed. It may be suggested in advanced cases after all other treatment has failed. PROGNOSIS  In time, most people recover from adhesive capsulitis. Sometimes, however, the  pain goes away but full movement of the shoulder does not return.  HOME CARE INSTRUCTIONS   Take any pain medications recommended by your healthcare provider. Follow the directions carefully.  If you have physical therapy, follow through with the therapist's suggestions. Be sure you understand the exercises you will be doing. You should understand:  How often the exercises should be done.  How many times each exercise should be repeated.  How long they should be done.  What other activities you should do, or not do.  That you should warm up before doing any exercise. Just 5 to 10 minutes will help. Small, gentle movements should get your shoulder ready for more.  Avoid high-demand exercise that involves your shoulder such as throwing. This type of exercise can make pain worse.  Consider using cold packs. Cold may ease swelling and pain. Ask your healthcare provider if a cold pack might help you. If so, get directions on how and when to use them. SEEK MEDICAL CARE IF:   You have any questions about your medications.  Your pain continues to increase. Document Released: 10/21/2008 Document Revised: 03/18/2011 Document Reviewed: 10/21/2008 [facility] Patient Information 2015 [place], [place]. This information is not intended to replace advice given to you by your health care provider. Make sure you discuss any questions you have with your health care provider.

## 2014-09-13 NOTE — Addendum Note (Signed)
Addended by: Noe Gens E on: 09/13/2014 10:57 AM   Modules accepted: Orders

## 2014-09-13 NOTE — Progress Notes (Signed)
Pre visit review using our clinic review tool, if applicable. No additional management support is needed unless otherwise documented below in the visit note. 

## 2014-09-16 ENCOUNTER — Emergency Department (HOSPITAL_COMMUNITY)
Admission: EM | Admit: 2014-09-16 | Discharge: 2014-09-16 | Discharge status: Against Medical Advice | Payer: Medicare Other | Attending: Emergency Medicine | Admitting: Emergency Medicine

## 2014-09-16 ENCOUNTER — Encounter (HOSPITAL_COMMUNITY): Payer: Self-pay | Admitting: Emergency Medicine

## 2014-09-16 ENCOUNTER — Telehealth: Payer: Self-pay | Admitting: Family Medicine

## 2014-09-16 DIAGNOSIS — R4182 Altered mental status, unspecified: Secondary | ICD-10-CM | POA: Diagnosis not present

## 2014-09-16 DIAGNOSIS — I2510 Atherosclerotic heart disease of native coronary artery without angina pectoris: Secondary | ICD-10-CM | POA: Diagnosis not present

## 2014-09-16 DIAGNOSIS — I10 Essential (primary) hypertension: Secondary | ICD-10-CM | POA: Insufficient documentation

## 2014-09-16 DIAGNOSIS — E669 Obesity, unspecified: Secondary | ICD-10-CM | POA: Insufficient documentation

## 2014-09-16 LAB — COMPREHENSIVE METABOLIC PANEL
ALBUMIN: 4 g/dL (ref 3.5–5.0)
ALT: 21 U/L (ref 14–54)
ANION GAP: 6 (ref 5–15)
AST: 24 U/L (ref 15–41)
Alkaline Phosphatase: 58 U/L (ref 38–126)
BUN: 28 mg/dL — ABNORMAL HIGH (ref 6–20)
CALCIUM: 9.1 mg/dL (ref 8.9–10.3)
CO2: 28 mmol/L (ref 22–32)
Chloride: 106 mmol/L (ref 101–111)
Creatinine, Ser: 0.71 mg/dL (ref 0.44–1.00)
GFR calc Af Amer: >60 mL/min (ref >60)
GFR calc non Af Amer: >60 mL/min (ref >60)
Glucose, Bld: 87 mg/dL (ref 65–99)
POTASSIUM: 4.4 mmol/L (ref 3.5–5.1)
SODIUM: 140 mmol/L (ref 135–145)
TOTAL PROTEIN: 7.4 g/dL (ref 6.5–8.1)
Total Bilirubin: 0.7 mg/dL (ref 0.3–1.2)

## 2014-09-16 LAB — URINALYSIS, ROUTINE W REFLEX MICROSCOPIC
Bilirubin Urine: NEGATIVE
Glucose, UA: NEGATIVE mg/dL
Hgb urine dipstick: NEGATIVE
KETONES UR: NEGATIVE mg/dL
NITRITE: NEGATIVE
PROTEIN: NEGATIVE mg/dL
Specific Gravity, Urine: 1.024 (ref 1.005–1.030)
Urobilinogen, UA: 0.2 mg/dL (ref 0.0–1.0)
pH: 5 (ref 5.0–8.0)

## 2014-09-16 LAB — URINE MICROSCOPIC-ADD ON

## 2014-09-16 LAB — CBC
HCT: 42.5 % (ref 36.0–46.0)
Hemoglobin: 13.7 g/dL (ref 12.0–15.0)
MCH: 30.8 pg (ref 26.0–34.0)
MCHC: 32.2 g/dL (ref 30.0–36.0)
MCV: 95.5 fL (ref 78.0–100.0)
PLATELETS: 178 10*3/uL (ref 150–400)
RBC: 4.45 MIL/uL (ref 3.87–5.11)
RDW: 14.4 % (ref 11.5–15.5)
WBC: 7.1 10*3/uL (ref 4.0–10.5)

## 2014-09-16 NOTE — Telephone Encounter (Signed)
FYI

## 2014-09-16 NOTE — ED Notes (Addendum)
Patient is leaving, states wait time is too long, and that they have a doctors appointment in the morning.

## 2014-09-16 NOTE — ED Notes (Signed)
Pt normally pleasant. While shopping husband states pt began swearing and acting unusual. Episode lasted from 1000 to 1200. No symptoms at present. Husband denies that pt had an extremity weakness or facial droop during episode. Pt alert and oriented in triage.

## 2014-09-16 NOTE — Telephone Encounter (Signed)
[facility] - Client [place] [facility] Patient Name: DAYANIS BERGQUIST DOB: 28-Oct-1932 Initial Comment Caller states wife was being loud using speech she has never used before, doe not remember any of it; Nurse Assessment Nurse: Martyn Ehrich, RN, Felicia Date/Time (Eastern Time): 09/16/2014 4:05:20 PM Confirm and document reason for call. If symptomatic, describe symptoms. ---This am his wife started talking loudly an using words she has not ever used - 3 episodes today - Pt has no recollection of these episodes Has the patient traveled out of the country within the last 30 days? ---No Does the patient require triage? ---Yes Related visit to physician within the last 2 weeks? ---No Does the PT have any chronic conditions? (i.e. diabetes, asthma, etc.) ---NoGuidelines Guideline Title Affirmed Question Affirmed Notes Confusion - Delirium [1] Acting confused (e.g., disoriented, slurred speech) AND [2] brief (now gone) Final Disposition User Go to ED Now Martyn Ehrich, RN, Solmon Ice Comments 3 episodes of confusion today - upgraded to ER - not confused now Referrals [facility] - ED [facility] - ED [facility] - ED Disagree/Comply: Comply Call Id: [phone]

## 2014-09-16 NOTE — Telephone Encounter (Signed)
Patient checked into ED 

## 2014-09-16 NOTE — ED Notes (Signed)
Patient's hus

## 2014-09-16 NOTE — Telephone Encounter (Signed)
Noted  

## 2014-09-19 ENCOUNTER — Encounter: Payer: Self-pay | Admitting: Family Medicine

## 2014-09-19 ENCOUNTER — Ambulatory Visit (INDEPENDENT_AMBULATORY_CARE_PROVIDER_SITE_OTHER): Payer: Medicare Other | Admitting: Family Medicine

## 2014-09-19 VITALS — BP 130/64 | HR 76 | Temp 98.0°F | Wt 162.0 lb

## 2014-09-19 DIAGNOSIS — F919 Conduct disorder, unspecified: Secondary | ICD-10-CM | POA: Diagnosis not present

## 2014-09-19 DIAGNOSIS — E038 Other specified hypothyroidism: Secondary | ICD-10-CM

## 2014-09-19 DIAGNOSIS — R4189 Other symptoms and signs involving cognitive functions and awareness: Secondary | ICD-10-CM | POA: Diagnosis not present

## 2014-09-19 LAB — TSH: TSH: 2.54 u[IU]/mL (ref 0.35–4.50)

## 2014-09-19 LAB — VITAMIN B12: Vitamin B-12: 495 pg/mL (ref 211–911)

## 2014-09-19 LAB — SEDIMENTATION RATE: SED RATE: 14 mm/h (ref 0–22)

## 2014-09-19 NOTE — Progress Notes (Signed)
Pre visit review using our clinic review tool, if applicable. No additional management support is needed unless otherwise documented below in the visit note. 

## 2014-09-19 NOTE — Progress Notes (Signed)
Subjective:    Patient ID: [name], female    DOB: 1932-06-19, 81 y.o.   MRN: [id number]  HPI Patient seen for recent cognitive changes. She has had some recent short-term memory deficits and we had noticed some of these just interacting with her recently been and she apparently had episode Friday where she was shopping with her husband and in the parking lot started shouting obscenities for about 5-10 minutes. There were no situational stressors or inciting events. Patient has no recollection whatsoever. He did not notice any slurred speech or any focal weakness. He took her to ED for further evaluation. She had some basic labs which were unremarkable. They waited for 2 hours and apparently became impatient and left without further assessment. Husband has not noted any abnormal behavior since then.  No recent head injury. She had CT head about a year ago unremarkable. She does complain of some occasional pains left temporal area but denies any visual changes or any focal weakness.  Past Medical History  Diagnosis Date  . OSA (obstructive sleep apnea)   . History of bronchitis   . Other diseases of lung, not elsewhere classified   . Hypertension   . CAD (coronary artery disease)     LAD 30% 2006  . History of atrial flutter   . Cerebrovascular disease   . Hyperlipemia   . Obesity   . GERD (gastroesophageal reflux disease)   . Diverticulosis of colon   . IBS (irritable bowel syndrome)   . Colonic polyp   . Crohn's disease   . Hypothyroidism   . History of hyperparathyroidism   . Fibromyalgia   . Osteopenia   . History of headache   . Syncope   . Restless legs syndrome (RLS)   . Psychosis   . History of tuberculosis   . Radiation 05/2008    5000 cGy to left lower eyelid, parotid lymphatics and upper neck  . Memory disturbance   . Bipolar affective     History of psychosis  . Merkel cell tumor     Left eyelid   Past Surgical History  Procedure Laterality Date  .  Parathyroid adenoma surgery    . Cholecystectomy    . Moh';s surg left lower eye lid surgery and lid reconstruction surgery  2/10  . Bladder suspension    . Cataract extraction Bilateral   . Appendectomy    . Tonsillectomy and adenoidectomy    . Lumbar laminectomy    . Incision and drainage perirectal abscess    . Heel spur excision    . Nasal sinus surgery Right     Right maxillary  . Eye lid surgery  04/04/2011    [facility] by Dr. Norlene Duel  . Posterior cervical fusion/foraminotomy N/A 10/12/2013    Procedure: Cervical five-Thoracic two cervico-thoracic posterior fusion;  Surgeon: [name], MD;  Location: [facility];  Service: Neurosurgery;  Laterality: N/A;    reports that she quit smoking about 54 years ago. Her smoking use included Cigarettes. She quit after 6 years of use. She has never used smokeless tobacco. She reports that she does not drink alcohol or use illicit drugs. family history includes Arthritis in her mother; Colon cancer in an other family member; Heart failure in her father; Hypertension in her mother; Pulmonary embolism in an other family member; Ulcers in her mother. Allergies  Allergen Reactions  . Prednisone Other (See Comments)    Patient had psychiatric episode with hallucinations. Caused 3.5 week  long hospitalization & loss of memory   . Carbamazepine Nausea Only and Other (See Comments)    Tegretol; dizziness, blurred vision, nausea, headache, insomnia  . Celebrex [Celecoxib] Other (See Comments)    No relief from pain; ineffective  . Codeine Nausea And Vomiting  . Demerol [Meperidine] Nausea And Vomiting  . Diclofenac Sodium Other (See Comments)    severe headache  . Fentanyl Nausea And Vomiting and Other (See Comments)    IV Fentanyl; caused nausea and vomiting Fentanyl patch; caused chest pain, HBP, with second patch experienced vertigo and nausea  . Fluoxetine Hcl Nausea Only and Other (See Comments)    dizziness, blurred vision, nausea, headache,  insomnia  . Hydromorphone Hcl Nausea And Vomiting  . Ibuprofen Other (See Comments)    Caused ulcers and colitis  . Indomethacin Nausea Only and Other (See Comments)    dizziness, blurred vision, nausea, headache, insomnia  . Lisinopril Cough  . Meperidine Hcl Nausea And Vomiting  . Morphine Nausea And Vomiting  . Oruvail [Ketoprofen] Other (See Comments)    No relief from pain; ineffective  . Pregabalin Other (See Comments)    severe restless legs, insomnia  . Propranolol Hcl Nausea Only and Other (See Comments)    dizziness, blurred vision, nausea, headache, insomnia  . Refresh Lacri-Lube [Artificial Tears] Swelling    Caused swelling, redness, hard crust on eyelids   . Relafen [Nabumetone] Other (See Comments)    No relief from pain; ineffective  . Ropinirole Hcl Other (See Comments)    severe restless legs and insomnia  . Sulfamethoxazole Nausea Only and Other (See Comments)    gas, loose stools  . Bacitracin Hives, Itching and Rash    redness      Review of Systems  Constitutional: Negative for fatigue.  Eyes: Negative for visual disturbance.  Respiratory: Negative for cough, chest tightness, shortness of breath and wheezing.   Cardiovascular: Negative for chest pain, palpitations and leg swelling.  Neurological: Negative for dizziness, seizures, syncope, weakness, light-headedness and headaches.  Psychiatric/Behavioral: Positive for confusion.       Objective:   Physical Exam  Constitutional: She is oriented to person, place, and time. She appears well-developed and well-nourished.  Neck: Neck supple. No thyromegaly present.  Cardiovascular: Normal rate and regular rhythm.   Pulmonary/Chest: Effort normal and breath sounds normal. No respiratory distress. She has no wheezes. She has no rales.  Musculoskeletal: She exhibits no edema.  Neurological: She is oriented to person, place, and time. No cranial nerve deficit.  Psychiatric: She has a normal mood and affect.  Her behavior is normal.  MMSE 24/30.           Assessment & Plan:  Cognitive impairment with recent behavioral disturbances as above. Etiology of the event (sudden behavioral disturbance) last Friday uncertain. Doubt vascular event such as TIA with no other suggestive symptoms. MMSE 24/30 signifies some definite cognitive impairment. Start with further lab work-TSH, B12, RPR, sedimentation rate. If all normal, consider MRI brain to further evaluate. If all the above normal consider starting low-dose Aricept.

## 2014-09-20 ENCOUNTER — Other Ambulatory Visit: Payer: Self-pay | Admitting: Family Medicine

## 2014-09-20 DIAGNOSIS — R4689 Other symptoms and signs involving appearance and behavior: Principal | ICD-10-CM

## 2014-09-20 DIAGNOSIS — R4189 Other symptoms and signs involving cognitive functions and awareness: Secondary | ICD-10-CM

## 2014-09-20 LAB — RPR

## 2014-09-24 ENCOUNTER — Inpatient Hospital Stay: Admission: RE | Admit: 2014-09-24 | Payer: Self-pay | Source: Ambulatory Visit

## 2014-09-25 ENCOUNTER — Ambulatory Visit
Admission: RE | Admit: 2014-09-25 | Discharge: 2014-09-25 | Disposition: A | Discharge status: Home / Self Care | Payer: Medicare Other | Source: Ambulatory Visit | Attending: Family Medicine | Admitting: Family Medicine

## 2014-09-25 DIAGNOSIS — R413 Other amnesia: Secondary | ICD-10-CM | POA: Diagnosis not present

## 2014-09-25 DIAGNOSIS — R410 Disorientation, unspecified: Secondary | ICD-10-CM | POA: Diagnosis not present

## 2014-09-25 DIAGNOSIS — R4689 Other symptoms and signs involving appearance and behavior: Principal | ICD-10-CM

## 2014-09-25 DIAGNOSIS — R4189 Other symptoms and signs involving cognitive functions and awareness: Secondary | ICD-10-CM

## 2014-09-26 ENCOUNTER — Telehealth: Payer: Self-pay | Admitting: Family Medicine

## 2014-09-26 NOTE — Telephone Encounter (Signed)
Pt daughter call to ask for a call back to discuss her Mom test results . Daughter said her Mom told her Dr [name] ordered her a 2 hour scan. Daughter is trying to find out if this is true.   [phone]

## 2014-09-26 NOTE — Telephone Encounter (Signed)
Please confirm if daughter is on her HIPPA forms

## 2014-09-26 NOTE — Telephone Encounter (Signed)
Please advised  

## 2014-09-27 NOTE — Telephone Encounter (Signed)
[name] (daughter) is on patient's DPR

## 2014-09-27 NOTE — Telephone Encounter (Signed)
I spoke with daughter and brought her up to date with recent information regarding her Mom's cognitive changes and workup to date.

## 2014-09-27 NOTE — Progress Notes (Signed)
Called the Pt and gave her her result about the brain MRI

## 2014-09-28 ENCOUNTER — Telehealth: Payer: Self-pay | Admitting: Family Medicine

## 2014-09-28 ENCOUNTER — Emergency Department (HOSPITAL_COMMUNITY)
Admission: EM | Admit: 2014-09-28 | Discharge: 2014-09-28 | Disposition: A | Discharge status: Home / Self Care | Payer: Medicare Other | Attending: Emergency Medicine | Admitting: Emergency Medicine

## 2014-09-28 ENCOUNTER — Encounter (HOSPITAL_COMMUNITY): Payer: Self-pay | Admitting: *Deleted

## 2014-09-28 ENCOUNTER — Emergency Department (HOSPITAL_COMMUNITY): Payer: Medicare Other

## 2014-09-28 DIAGNOSIS — R072 Precordial pain: Secondary | ICD-10-CM | POA: Diagnosis not present

## 2014-09-28 DIAGNOSIS — Z85821 Personal history of Merkel cell carcinoma: Secondary | ICD-10-CM | POA: Insufficient documentation

## 2014-09-28 DIAGNOSIS — E213 Hyperparathyroidism, unspecified: Secondary | ICD-10-CM | POA: Insufficient documentation

## 2014-09-28 DIAGNOSIS — I2510 Atherosclerotic heart disease of native coronary artery without angina pectoris: Secondary | ICD-10-CM | POA: Diagnosis not present

## 2014-09-28 DIAGNOSIS — E669 Obesity, unspecified: Secondary | ICD-10-CM | POA: Diagnosis not present

## 2014-09-28 DIAGNOSIS — E039 Hypothyroidism, unspecified: Secondary | ICD-10-CM | POA: Diagnosis not present

## 2014-09-28 DIAGNOSIS — I4892 Unspecified atrial flutter: Secondary | ICD-10-CM | POA: Insufficient documentation

## 2014-09-28 DIAGNOSIS — M797 Fibromyalgia: Secondary | ICD-10-CM | POA: Diagnosis not present

## 2014-09-28 DIAGNOSIS — G2581 Restless legs syndrome: Secondary | ICD-10-CM | POA: Insufficient documentation

## 2014-09-28 DIAGNOSIS — E785 Hyperlipidemia, unspecified: Secondary | ICD-10-CM | POA: Diagnosis not present

## 2014-09-28 DIAGNOSIS — Z8611 Personal history of tuberculosis: Secondary | ICD-10-CM | POA: Diagnosis not present

## 2014-09-28 DIAGNOSIS — R51 Headache: Secondary | ICD-10-CM | POA: Diagnosis not present

## 2014-09-28 DIAGNOSIS — K589 Irritable bowel syndrome without diarrhea: Secondary | ICD-10-CM | POA: Diagnosis not present

## 2014-09-28 DIAGNOSIS — Z87891 Personal history of nicotine dependence: Secondary | ICD-10-CM | POA: Insufficient documentation

## 2014-09-28 DIAGNOSIS — Z8719 Personal history of other diseases of the digestive system: Secondary | ICD-10-CM | POA: Diagnosis not present

## 2014-09-28 DIAGNOSIS — Z7982 Long term (current) use of aspirin: Secondary | ICD-10-CM | POA: Insufficient documentation

## 2014-09-28 DIAGNOSIS — M8580 Other specified disorders of bone density and structure, unspecified site: Secondary | ICD-10-CM | POA: Insufficient documentation

## 2014-09-28 DIAGNOSIS — I10 Essential (primary) hypertension: Secondary | ICD-10-CM

## 2014-09-28 DIAGNOSIS — Z79899 Other long term (current) drug therapy: Secondary | ICD-10-CM | POA: Diagnosis not present

## 2014-09-28 DIAGNOSIS — Z8659 Personal history of other mental and behavioral disorders: Secondary | ICD-10-CM | POA: Diagnosis not present

## 2014-09-28 DIAGNOSIS — R079 Chest pain, unspecified: Secondary | ICD-10-CM | POA: Insufficient documentation

## 2014-09-28 DIAGNOSIS — Z7951 Long term (current) use of inhaled steroids: Secondary | ICD-10-CM | POA: Diagnosis not present

## 2014-09-28 LAB — CBC WITH DIFFERENTIAL/PLATELET
Basophils Absolute: 0 10*3/uL (ref 0.0–0.1)
Basophils Relative: 1 %
EOS ABS: 0.1 10*3/uL (ref 0.0–0.7)
EOS PCT: 2 %
HCT: 38.8 % (ref 36.0–46.0)
Hemoglobin: 13 g/dL (ref 12.0–15.0)
LYMPHS ABS: 1.9 10*3/uL (ref 0.7–4.0)
Lymphocytes Relative: 29 %
MCH: 31.5 pg (ref 26.0–34.0)
MCHC: 33.5 g/dL (ref 30.0–36.0)
MCV: 93.9 fL (ref 78.0–100.0)
MONO ABS: 0.4 10*3/uL (ref 0.1–1.0)
MONOS PCT: 7 %
Neutro Abs: 4 10*3/uL (ref 1.7–7.7)
Neutrophils Relative %: 61 %
PLATELETS: 160 10*3/uL (ref 150–400)
RBC: 4.13 MIL/uL (ref 3.87–5.11)
RDW: 14.4 % (ref 11.5–15.5)
WBC: 6.4 10*3/uL (ref 4.0–10.5)

## 2014-09-28 LAB — COMPREHENSIVE METABOLIC PANEL
ALT: 78 U/L — ABNORMAL HIGH (ref 14–54)
ANION GAP: 9 (ref 5–15)
AST: 155 U/L — ABNORMAL HIGH (ref 15–41)
Albumin: 3.6 g/dL (ref 3.5–5.0)
Alkaline Phosphatase: 75 U/L (ref 38–126)
BUN: 26 mg/dL — ABNORMAL HIGH (ref 6–20)
CHLORIDE: 102 mmol/L (ref 101–111)
CO2: 25 mmol/L (ref 22–32)
Calcium: 9.1 mg/dL (ref 8.9–10.3)
Creatinine, Ser: 0.76 mg/dL (ref 0.44–1.00)
Glucose, Bld: 107 mg/dL — ABNORMAL HIGH (ref 65–99)
POTASSIUM: 4.4 mmol/L (ref 3.5–5.1)
SODIUM: 136 mmol/L (ref 135–145)
Total Bilirubin: 0.8 mg/dL (ref 0.3–1.2)
Total Protein: 6.5 g/dL (ref 6.5–8.1)

## 2014-09-28 LAB — I-STAT TROPONIN, ED: Troponin i, poc: 0 ng/mL (ref 0.00–0.08)

## 2014-09-28 NOTE — Telephone Encounter (Signed)
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 Patient is not feeling well. Blood pressure has been going up and down 182/89 pulse 78. Having cramping bellow her breast.   DOB: 03-09-1932      Nurse Assessment  Nurse: Venetia Maxon, RN, Manuela Schwartz Date/Time (Eastern Time): 09/28/2014 3:14:21 PM  Confirm and document reason for call. If symptomatic, describe symptoms. ---Caller states that is from [place] and is [place]. Patient is not feeling well. Blood pressure has been going up and down 182/89 pulse 78. Having cramping bellow her LT breast. 130/58 this am . no pain with breathing . hx of CVA . Pain in left temple to touch.  Has the patient traveled out of the country within the last 30 days? ---No  Does the patient require triage? ---Yes  Related visit to physician within the last 2 weeks? ---No  Does the PT have any chronic conditions? (i.e. diabetes, asthma, etc.) ---Yes  List chronic conditions. ---HTN hx of CVA cholesterol lives in [place] Notes   Chest Pain [1] Chest pain lasts > 5 minutes AND [2] age > 75    Final Disposition User   Call EMS 911 Now Venetia Maxon, RN, Manuela Schwartz       Disagree/Comply: Comply

## 2014-09-28 NOTE — Telephone Encounter (Signed)
Pt checked into ED

## 2014-09-28 NOTE — Discharge Instructions (Signed)
Return for any new or worse symptoms or chest pain lasting 20 minutes or longer. Keep a record of your blood pressure daily log and follow-up with your regular doctor. You may need some mild adjustments in your blood pressure medicine based on what was seen here at the emergency department. Blood pressure at discharge was 143/57.

## 2014-09-28 NOTE — ED Notes (Signed)
Pt arrives from home via [facility]. Pt states she had a brief period of CP earlier today that resolved on its own. Pt states she is currently pain free and took 325mg  of asa at home before leaving with ems. Pt is in nad and states she experienced no associated sx with cp.

## 2014-09-28 NOTE — ED Provider Notes (Signed)
CSN: [id number]     Arrival date & time 09/28/14  1635 History   First MD Initiated Contact with Patient 09/28/14 1648     Chief Complaint  Patient presents with  . Chest Pain     (Consider location/radiation/quality/duration/timing/severity/associated sxs/prior Treatment) Patient is a 81 y.o. female presenting with chest pain. The history is provided by the patient.  Chest Pain Associated symptoms: headache   Associated symptoms: no abdominal pain, no back pain, no fever, no nausea, no shortness of breath and not vomiting    Patient referred in from senior living assisted living center. 4 chest pain but patient states that she really was concerned about elevated blood pressure. Patient had a brief episode of chest pain that occurred around 10 in the morning at most it was too out of 10 it was sort of left substernal area. Lasted less than 5 minutes no nausea no vomiting no shortness of breath no radiation to the back. In addition patient has a complaint of a left-sided headache as being worked up by her primary care provider she has an MRI of the brain for that without any acute findings. That was just recently.  Past Medical History  Diagnosis Date  . OSA (obstructive sleep apnea)   . History of bronchitis   . Other diseases of lung, not elsewhere classified   . Hypertension   . CAD (coronary artery disease)     LAD 30% 2006  . History of atrial flutter   . Cerebrovascular disease   . Hyperlipemia   . Obesity   . GERD (gastroesophageal reflux disease)   . Diverticulosis of colon   . IBS (irritable bowel syndrome)   . Colonic polyp   . Crohn's disease   . Hypothyroidism   . History of hyperparathyroidism   . Fibromyalgia   . Osteopenia   . History of headache   . Syncope   . Restless legs syndrome (RLS)   . Psychosis   . History of tuberculosis   . Radiation 05/2008    5000 cGy to left lower eyelid, parotid lymphatics and upper neck  . Memory disturbance   . Bipolar  affective     History of psychosis  . Merkel cell tumor     Left eyelid   Past Surgical History  Procedure Laterality Date  . Parathyroid adenoma surgery    . Cholecystectomy    . Moh';s surg left lower eye lid surgery and lid reconstruction surgery  2/10  . Bladder suspension    . Cataract extraction Bilateral   . Appendectomy    . Tonsillectomy and adenoidectomy    . Lumbar laminectomy    . Incision and drainage perirectal abscess    . Heel spur excision    . Nasal sinus surgery Right     Right maxillary  . Eye lid surgery  04/04/2011    [facility] by Dr. Norlene Duel  . Posterior cervical fusion/foraminotomy N/A 10/12/2013    Procedure: Cervical five-Thoracic two cervico-thoracic posterior fusion;  Surgeon: [name], MD;  Location: [facility];  Service: Neurosurgery;  Laterality: N/A;   Family History  Problem Relation Age of Onset  . Colon cancer      grandmother  . Arthritis Mother   . Hypertension Mother   . Ulcers Mother   . Heart failure Father   . Pulmonary embolism     Social History  Substance Use Topics  . Smoking status: Former Smoker -- 6 years    Types: Cigarettes  Quit date: 01/08/1960  . Smokeless tobacco: Never Used  . Alcohol Use: No   OB History    No data available     Review of Systems  Constitutional: Negative for fever.  HENT: Negative for congestion.   Respiratory: Negative for shortness of breath.   Cardiovascular: Positive for chest pain.  Gastrointestinal: Negative for nausea, vomiting and abdominal pain.  Genitourinary: Negative for dysuria.  Musculoskeletal: Negative for back pain.  Skin: Negative for rash.  Neurological: Positive for headaches.  Hematological: Does not bruise/bleed easily.  Psychiatric/Behavioral: Negative for confusion.      Allergies  Prednisone; Carbamazepine; Celebrex; Codeine; Demerol; Diclofenac sodium; Fentanyl; Fluoxetine hcl; Hydromorphone hcl; Ibuprofen; Indomethacin; Lisinopril; Meperidine hcl;  Morphine; Oruvail; Pregabalin; Propranolol hcl; Refresh lacri-lube; Relafen; Ropinirole hcl; Sulfamethoxazole; and Bacitracin  Home Medications   Prior to Admission medications   Medication Sig Start Date End Date Taking? Authorizing Provider  aspirin EC 81 MG tablet Take 81 mg by mouth every evening.   Yes Historical Provider, MD  atorvastatin (LIPITOR) 40 MG tablet TAKE 1 TABLET DAILY 07/12/14  Yes [name], MD  Biotin 5000 MCG TABS Take 5,000 mcg by mouth every morning.    Yes Historical Provider, MD  Calcium-Magnesium 500-250 MG TABS Take 2 tablets by mouth daily at 6 PM.  02/18/11  Yes Tammy S Parrett, NP  carvedilol (COREG) 25 MG tablet Take 1 tablet (25 mg total) by mouth 2 (two) times daily with a meal. PATIENT NEEDS TO CONTACT OFFICE FOR ADDITIONAL REFILLS 08/23/14  Yes Minus Breeding, MD  Cholecalciferol (VITAMIN D3) 2000 UNITS TABS Take 2,000 Units by mouth daily at 12 noon.    Yes Historical Provider, MD  cholestyramine (QUESTRAN) 4 GM/DOSE powder Take 4 g by mouth daily with breakfast. 2 scoops in water at 7 am   Yes Historical Provider, MD  co-enzyme Q-10 50 MG capsule Take 50 mg by mouth daily at 6 PM.    Yes Historical Provider, MD  donepezil (ARICEPT) 5 MG tablet Take 2 tablets (10 mg total) by mouth daily. Take 2 tablets (10 mg) at 7 am 08/22/14  Yes [name], MD  Evening Primrose Oil 500 MG CAPS Take 500 mg by mouth 2 (two) times daily.    Yes Historical Provider, MD  fluticasone (FLONASE) 50 MCG/ACT nasal spray Place 2 sprays into both nostrils at bedtime.   Yes Historical Provider, MD  guaifenesin (HUMIBID E) 400 MG TABS Take 400 mg by mouth 2 (two) times daily.    Yes Historical Provider, MD  ketotifen (THERA TEARS ALLERGY) 0.025 % ophthalmic solution Place 1 drop into both eyes daily as needed (dry eyes).   Yes Historical Provider, MD  levothyroxine (SYNTHROID, LEVOTHROID) 88 MCG tablet Take 1 tablet (88 mcg total) by mouth daily before breakfast. 07/19/14  Yes  [name], MD  lidocaine (LIDODERM) 5 % Place 1 patch onto the skin every 12 (twelve) hours as needed (back pain). Remove & Discard patch within 12 hours or as directed by MD   Yes Historical Provider, MD  mesalamine (LIALDA) 1.2 G EC tablet Take 3.6 g by mouth daily with lunch.    Yes Historical Provider, MD  Multiple Vitamins-Minerals (SUPER VITA-MINS PO) Take 1 tablet by mouth daily at 12 noon.    Yes Historical Provider, MD  Omega 3 1000 MG CAPS Take 1,000 mg by mouth 2 (two) times daily.   Yes Historical Provider, MD  ondansetron (ZOFRAN) 4 MG tablet Take 1 tablet (4  mg total) by mouth every 8 (eight) hours as needed for nausea or vomiting. 10/08/13  Yes Debby Freiberg, MD  pramipexole (MIRAPEX) 0.5 MG tablet TAKE 1 TABLET TWICE A DAY (NEED OFFICE VISIT) 09/05/14  Yes [name], MD  PROBIOTIC CAPS Take 1 capsule by mouth daily. 02/18/11  Yes Tammy S Parrett, NP  Simethicone (PHAZYME PO) Take 1-2 tablets by mouth 2 (two) times daily. Take 1 tablet at noon and 2 tablets at 4pm   Yes Historical Provider, MD  traMADol (ULTRAM) 50 MG tablet Take 1 tablet (50 mg total) by mouth every 6 (six) hours as needed. 11/25/13  Yes [name], MD  vitamin C (ASCORBIC ACID) 500 MG tablet Take 500 mg by mouth daily at 12 noon.    Yes Historical Provider, MD  levofloxacin (LEVAQUIN) 750 MG tablet Take 1 tablet (750 mg total) by mouth daily. Patient not taking: Reported on 09/28/2014 07/19/14   Kennyth Arnold, FNP  metroNIDAZOLE (FLAGYL) 500 MG tablet Take 1 tablet (500 mg total) by mouth 3 (three) times daily. Patient not taking: Reported on 09/28/2014 07/19/14   Kennyth Arnold, FNP   BP 132/51 mmHg  Pulse 67  Temp(Src) 98 F (36.7 C) (Oral)  Resp 15  SpO2 94% Physical Exam  Constitutional: She is oriented to person, place, and time. She appears well-developed and well-nourished. No distress.  HENT:  Head: Normocephalic and atraumatic.  Mouth/Throat: Oropharynx is clear and moist.  Eyes:  Conjunctivae and EOM are normal. Pupils are equal, round, and reactive to light.  Neck: Normal range of motion. Neck supple.  Cardiovascular: Normal rate, regular rhythm and normal heart sounds.   No murmur heard. Pulmonary/Chest: Effort normal and breath sounds normal. No respiratory distress. She exhibits no tenderness.  Abdominal: Bowel sounds are normal. There is no tenderness.  Musculoskeletal: Normal range of motion. She exhibits no edema.  Neurological: She is alert and oriented to person, place, and time. No cranial nerve deficit. She exhibits normal muscle tone. Coordination normal.  Skin: Skin is warm.  Nursing note and vitals reviewed.   ED Course  Procedures (including critical care time) Labs Review Labs Reviewed  COMPREHENSIVE METABOLIC PANEL - Abnormal; Notable for the following:    Glucose, Bld 107 (*)    BUN 26 (*)    AST 155 (*)    ALT 78 (*)    All other components within normal limits  CBC WITH DIFFERENTIAL/PLATELET  I-STAT TROPOININ, ED  I-STAT TROPOININ, ED    Imaging Review Dg Chest 2 View  09/28/2014   CLINICAL DATA:  Chest pain for 1 day, hypertension, coronary artery disease, GERD, atrial flutter, personal history of TB, Crohn's disease, Merkel cell tumor, former smoker  EXAM: CHEST  2 VIEW  COMPARISON:  10/12/2013  FINDINGS: Borderline enlargement of cardiac silhouette.  Mediastinal contours and pulmonary vascularity normal.  Atherosclerotic calcification aortic arch.  Bronchitic changes with subsegmental atelectasis in RIGHT middle lobe adjacent to minor fissure.  No acute infiltrate, pleural effusion or pneumothorax.  Bones demineralized.  Question prior BILATERAL rotator cuff repair.  Chronic superior endplate compression deformity of an upper thoracic vertebra stable.  IMPRESSION: Borderline enlargement of cardiac silhouette.  Bronchitic changes with subsegmental atelectasis RIGHT middle lobe.   Electronically Signed   By: Lavonia Dana M.D.   On: 09/28/2014  18:20   I have personally reviewed and evaluated these images and lab results as part of my medical decision-making.   EKG Interpretation   Date/Time:  Wednesday September 28 2014 16:38:08 EDT Ventricular Rate:  70 PR Interval:  130 QRS Duration: 81 QT Interval:  399 QTC Calculation: 430 R Axis:   47 Text Interpretation:  Sinus rhythm Confirmed by ZACKOWSKI  MD, SCOTT  ([id number]) on 09/28/2014 4:49:39 PM      MDM   Final diagnoses:  Chest pain, unspecified chest pain type  Essential hypertension    Patient here for concerns more about blood pressure being high then the chest pain. Patient chest pain lasted the 5 minutes or less. It wasn't that intense it was a bit of a pressure feeling less than 2 out of 10. Patient has no pain now at all. Patient's last blood pressure here was slightly elevated systolic of 340/37. Pressure prior to that was 132/51. Daily log of her blood pressure and follow-up with her primary care doctor would be appropriate apparently at the assisted living area patient's blood pressure was higher. Patient's labs here without any significant abnormalities. Chest x-ray without evidence of pneumonia although it did show evidence of atelectasis. Patient will return for any new or worse symptoms.    Fredia Sorrow, MD 09/28/14 [phone]

## 2014-09-30 ENCOUNTER — Ambulatory Visit (INDEPENDENT_AMBULATORY_CARE_PROVIDER_SITE_OTHER): Payer: Medicare Other | Admitting: Family Medicine

## 2014-09-30 VITALS — BP 110/70 | HR 68 | Temp 98.0°F | Wt 167.0 lb

## 2014-09-30 DIAGNOSIS — R0789 Other chest pain: Secondary | ICD-10-CM | POA: Diagnosis not present

## 2014-09-30 DIAGNOSIS — I10 Essential (primary) hypertension: Secondary | ICD-10-CM | POA: Diagnosis not present

## 2014-09-30 NOTE — Patient Instructions (Addendum)
Monitor blood pressure and be in touch if consistently > 150/90.    Follow up for any recurrent chest pain.

## 2014-09-30 NOTE — Progress Notes (Signed)
Pre visit review using our clinic review tool, if applicable. No additional management support is needed unless otherwise documented below in the visit note. 

## 2014-09-30 NOTE — Progress Notes (Signed)
Subjective:    Patient ID: [name], female    DOB: 15-Jun-1932, 81 y.o.   MRN: [id number]  HPI Patient seen for follow-up regarding recent chest pain. She was seen 2 days ago ER with according to ER notes 5 minutes or less of substernal chest pain. This occurred at rest. Patient has some cognitive impairment and history is somewhat unreliable. She states that her pain lasted longer. In any event, she's had none since then. Troponins were negative. EKG no acute changes. She does have past history of atrial flutter but does not have any known history of CAD. She remains on carvedilol. Patient was more concerned about elevated blood pressure. She had a couple of home readings around 532 systolic. In the ER, however, blood pressure was 143/57 and not out of control. She takes carvedilol but no other blood pressure medications. She's had readings that of been very up and down since then. Some as low as 123/47.  Denies any recent GERD symptoms. No dysphagia.  Past Medical History  Diagnosis Date  . OSA (obstructive sleep apnea)   . History of bronchitis   . Other diseases of lung, not elsewhere classified   . Hypertension   . CAD (coronary artery disease)     LAD 30% 2006  . History of atrial flutter   . Cerebrovascular disease   . Hyperlipemia   . Obesity   . GERD (gastroesophageal reflux disease)   . Diverticulosis of colon   . IBS (irritable bowel syndrome)   . Colonic polyp   . Crohn's disease   . Hypothyroidism   . History of hyperparathyroidism   . Fibromyalgia   . Osteopenia   . History of headache   . Syncope   . Restless legs syndrome (RLS)   . Psychosis   . History of tuberculosis   . Radiation 05/2008    5000 cGy to left lower eyelid, parotid lymphatics and upper neck  . Memory disturbance   . Bipolar affective     History of psychosis  . Merkel cell tumor     Left eyelid   Past Surgical History  Procedure Laterality Date  . Parathyroid adenoma surgery    .  Cholecystectomy    . Moh';s surg left lower eye lid surgery and lid reconstruction surgery  2/10  . Bladder suspension    . Cataract extraction Bilateral   . Appendectomy    . Tonsillectomy and adenoidectomy    . Lumbar laminectomy    . Incision and drainage perirectal abscess    . Heel spur excision    . Nasal sinus surgery Right     Right maxillary  . Eye lid surgery  04/04/2011    [facility] by Dr. Norlene Duel  . Posterior cervical fusion/foraminotomy N/A 10/12/2013    Procedure: Cervical five-Thoracic two cervico-thoracic posterior fusion;  Surgeon: [name], MD;  Location: [facility];  Service: Neurosurgery;  Laterality: N/A;    reports that she quit smoking about 54 years ago. Her smoking use included Cigarettes. She quit after 6 years of use. She has never used smokeless tobacco. She reports that she does not drink alcohol or use illicit drugs. family history includes Arthritis in her mother; Colon cancer in an other family member; Heart failure in her father; Hypertension in her mother; Pulmonary embolism in an other family member; Ulcers in her mother. Allergies  Allergen Reactions  . Prednisone Other (See Comments)    Patient had psychiatric episode with hallucinations. Caused  3.5 week long hospitalization & loss of memory   . Carbamazepine Nausea Only and Other (See Comments)    Tegretol; dizziness, blurred vision, nausea, headache, insomnia  . Celebrex [Celecoxib] Other (See Comments)    No relief from pain; ineffective  . Codeine Nausea And Vomiting  . Demerol [Meperidine] Nausea And Vomiting  . Diclofenac Sodium Other (See Comments)    severe headache  . Fentanyl Nausea And Vomiting and Other (See Comments)    IV Fentanyl; caused nausea and vomiting Fentanyl patch; caused chest pain, HBP, with second patch experienced vertigo and nausea  . Fluoxetine Hcl Nausea Only and Other (See Comments)    dizziness, blurred vision, nausea, headache, insomnia  . Hydromorphone Hcl  Nausea And Vomiting  . Ibuprofen Other (See Comments)    Caused ulcers and colitis  . Indomethacin Nausea Only and Other (See Comments)    dizziness, blurred vision, nausea, headache, insomnia  . Lisinopril Cough  . Meperidine Hcl Nausea And Vomiting  . Morphine Nausea And Vomiting  . Oruvail [Ketoprofen] Other (See Comments)    No relief from pain; ineffective  . Pregabalin Other (See Comments)    severe restless legs, insomnia  . Propranolol Hcl Nausea Only and Other (See Comments)    dizziness, blurred vision, nausea, headache, insomnia  . Refresh Lacri-Lube [Artificial Tears] Swelling    Caused swelling, redness, hard crust on eyelids   . Relafen [Nabumetone] Other (See Comments)    No relief from pain; ineffective  . Ropinirole Hcl Other (See Comments)    severe restless legs and insomnia  . Sulfamethoxazole Nausea Only and Other (See Comments)    gas, loose stools  . Bacitracin Hives, Itching and Rash    redness      Review of Systems  Constitutional: Negative for fever, chills and fatigue.  HENT: Negative for trouble swallowing.   Eyes: Negative for visual disturbance.  Respiratory: Negative for cough, shortness of breath and wheezing.   Cardiovascular: Negative for palpitations and leg swelling.  Gastrointestinal: Negative for abdominal pain.  Neurological: Negative for dizziness, seizures, syncope, weakness, light-headedness and headaches.       Objective:   Physical Exam  Constitutional: She appears well-developed and well-nourished.  Neck: Neck supple. No JVD present.  Cardiovascular: Normal rate and regular rhythm.  Exam reveals no gallop.   Pulmonary/Chest: Effort normal and breath sounds normal. No respiratory distress. She has no wheezes. She has no rales.  Musculoskeletal: She exhibits no edema.  Neurological: She is alert.          Assessment & Plan:  #1 hypertension. Readings of been up and down by home readings. Repeat reading today by me left  arm seated 148/68. We've recommended observation and continue monitoring. If she has consistent readings over 150 overnight he at follow-up in 3 weeks consider addition of low-dose amlodipine such as 2.5 mg #2 recent transient atypical chest pain. ER visit as above unrevealing. She's had no episodes since then. We recommend observation. If she has recurrence consider non-treadmill nuclear evaluation

## 2014-10-03 ENCOUNTER — Telehealth: Payer: Self-pay | Admitting: Family Medicine

## 2014-10-03 ENCOUNTER — Ambulatory Visit: Payer: Medicare Other | Admitting: Family Medicine

## 2014-10-03 MED ORDER — AMLODIPINE BESYLATE 2.5 MG PO TABS: 2.5000 mg | ORAL_TABLET | Freq: Every day | ORAL | Status: DC

## 2014-10-03 NOTE — Telephone Encounter (Signed)
Patient notifies [place] that she has had some continued blood pressure readings at home frequently over 022V systolic. She was actually in the office today with her husband and we checked her blood pressure - left arm seated 160/70. Will start low-dose amlodipine 2.5 mg once daily

## 2014-10-05 ENCOUNTER — Other Ambulatory Visit: Payer: Self-pay

## 2014-10-11 ENCOUNTER — Telehealth: Payer: Self-pay | Admitting: *Deleted

## 2014-10-11 NOTE — Telephone Encounter (Signed)
Patient walked in today with concerns of elevated BP and nausea/vomitting. VS during triage BP 150/80 HR 76 Temp 97.4. Patient states nausea started this morning but denied eating anything for breakfast. Noted Dr. [name] prescribed Amlodipine 2.5 mg on 10/03/2014. Patient and husband state they do not remember picking up that medication and are currently not taking anything for BP. Called pharmacy and requested to have another Rx ready. Patient and husband plan to pick up Rx on way home today.  Dr. [name] aware.

## 2014-10-17 ENCOUNTER — Encounter: Payer: Self-pay | Admitting: Family Medicine

## 2014-10-17 ENCOUNTER — Ambulatory Visit (INDEPENDENT_AMBULATORY_CARE_PROVIDER_SITE_OTHER): Payer: Medicare Other | Admitting: Family Medicine

## 2014-10-17 VITALS — BP 150/74 | HR 81 | Temp 97.6°F | Wt 166.5 lb

## 2014-10-17 DIAGNOSIS — I10 Essential (primary) hypertension: Secondary | ICD-10-CM | POA: Diagnosis not present

## 2014-10-17 DIAGNOSIS — Z23 Encounter for immunization: Secondary | ICD-10-CM | POA: Diagnosis not present

## 2014-10-17 NOTE — Progress Notes (Signed)
Subjective:    Patient ID: [name], female    DOB: 1932/06/20, 81 y.o.   MRN: [id number]  HPI patient here with concerns for elevated blood pressure. Unfortunately, both she and her husband have some cognitive deficits. We had added amlodipine 2.5 mg about 3 weeks ago but she never got this prescription filled. She does not seem to have good recollection of [place] starting this. She does bring in several home blood pressure readings and several these are 150s to occasionally 734 systolic.  Past Medical History  Diagnosis Date  . OSA (obstructive sleep apnea)   . History of bronchitis   . Other diseases of lung, not elsewhere classified   . Hypertension   . CAD (coronary artery disease)     LAD 30% 2006  . History of atrial flutter   . Cerebrovascular disease   . Hyperlipemia   . Obesity   . GERD (gastroesophageal reflux disease)   . Diverticulosis of colon   . IBS (irritable bowel syndrome)   . Colonic polyp   . Crohn's disease ([place])   . Hypothyroidism   . History of hyperparathyroidism   . Fibromyalgia   . Osteopenia   . History of headache   . Syncope   . Restless legs syndrome (RLS)   . Psychosis   . History of tuberculosis   . Radiation 05/2008    5000 cGy to left lower eyelid, parotid lymphatics and upper neck  . Memory disturbance   . Bipolar affective ([place])     History of psychosis  . Merkel cell tumor ([place])     Left eyelid   Past Surgical History  Procedure Laterality Date  . Parathyroid adenoma surgery    . Cholecystectomy    . Moh';s surg left lower eye lid surgery and lid reconstruction surgery  2/10  . Bladder suspension    . Cataract extraction Bilateral   . Appendectomy    . Tonsillectomy and adenoidectomy    . Lumbar laminectomy    . Incision and drainage perirectal abscess    . Heel spur excision    . Nasal sinus surgery Right     Right maxillary  . Eye lid surgery  04/04/2011    [facility] by Dr. Norlene Duel  . Posterior cervical fusion/foraminotomy  N/A 10/12/2013    Procedure: Cervical five-Thoracic two cervico-thoracic posterior fusion;  Surgeon: [name], MD;  Location: [facility];  Service: Neurosurgery;  Laterality: N/A;    reports that she quit smoking about 54 years ago. Her smoking use included Cigarettes. She quit after 6 years of use. She has never used smokeless tobacco. She reports that she does not drink alcohol or use illicit drugs. family history includes Arthritis in her mother; Colon cancer in an other family member; Heart failure in her father; Hypertension in her mother; Pulmonary embolism in an other family member; Ulcers in her mother. Allergies  Allergen Reactions  . Prednisone Other (See Comments)    Patient had psychiatric episode with hallucinations. Caused 3.5 week long hospitalization & loss of memory   . Carbamazepine Nausea Only and Other (See Comments)    Tegretol; dizziness, blurred vision, nausea, headache, insomnia  . Celebrex [Celecoxib] Other (See Comments)    No relief from pain; ineffective  . Codeine Nausea And Vomiting  . Demerol [Meperidine] Nausea And Vomiting  . Diclofenac Sodium Other (See Comments)    severe headache  . Fentanyl Nausea And Vomiting and Other (See Comments)    IV  Fentanyl; caused nausea and vomiting Fentanyl patch; caused chest pain, HBP, with second patch experienced vertigo and nausea  . Fluoxetine Hcl Nausea Only and Other (See Comments)    dizziness, blurred vision, nausea, headache, insomnia  . Hydromorphone Hcl Nausea And Vomiting  . Ibuprofen Other (See Comments)    Caused ulcers and colitis  . Indomethacin Nausea Only and Other (See Comments)    dizziness, blurred vision, nausea, headache, insomnia  . Lisinopril Cough  . Meperidine Hcl Nausea And Vomiting  . Morphine Nausea And Vomiting  . Oruvail [Ketoprofen] Other (See Comments)    No relief from pain; ineffective  . Pregabalin Other (See Comments)    severe restless legs, insomnia  . Propranolol Hcl  Nausea Only and Other (See Comments)    dizziness, blurred vision, nausea, headache, insomnia  . Refresh Lacri-Lube [Artificial Tears] Swelling    Caused swelling, redness, hard crust on eyelids   . Relafen [Nabumetone] Other (See Comments)    No relief from pain; ineffective  . Ropinirole Hcl Other (See Comments)    severe restless legs and insomnia  . Sulfamethoxazole Nausea Only and Other (See Comments)    gas, loose stools  . Bacitracin Hives, Itching and Rash    redness     Review of Systems  Constitutional: Positive for fatigue.  Eyes: Negative for visual disturbance.  Respiratory: Negative for cough, chest tightness, shortness of breath and wheezing.   Cardiovascular: Negative for chest pain, palpitations and leg swelling.  Neurological: Positive for headaches. Negative for dizziness, seizures, syncope, weakness and light-headedness.       Objective:   Physical Exam  Constitutional: She appears well-developed and well-nourished.  Cardiovascular: Normal rate and regular rhythm.   Pulmonary/Chest: Effort normal and breath sounds normal. No respiratory distress. She has no wheezes. She has no rales.  Musculoskeletal: She exhibits no edema.          Assessment & Plan:  Hypertension. Marginal control. Start amlodipine 2.5 mg daily which we actually prescribed quite some time ago. We will plan to call her daughter [name]) who has power of attorney and discuss issues. I think that we are coming to a point soon where both she and her husband will need some assistance with medications.  Attempted to call her daughter [name] but busy signal received.

## 2014-10-17 NOTE — Progress Notes (Signed)
Pre visit review using our clinic review tool, if applicable. No additional management support is needed unless otherwise documented below in the visit note. 

## 2014-10-17 NOTE — Patient Instructions (Signed)
Go ahead and start the Amlodipine one daily.

## 2014-10-18 ENCOUNTER — Encounter: Payer: Self-pay | Admitting: Family Medicine

## 2014-10-25 ENCOUNTER — Ambulatory Visit: Payer: Medicare Other | Admitting: Physical Therapy

## 2014-10-27 ENCOUNTER — Ambulatory Visit: Payer: Medicare Other | Admitting: Family Medicine

## 2014-10-28 ENCOUNTER — Ambulatory Visit (INDEPENDENT_AMBULATORY_CARE_PROVIDER_SITE_OTHER): Payer: Medicare Other | Admitting: Family Medicine

## 2014-10-28 VITALS — BP 130/60 | HR 75 | Temp 98.4°F | Wt 164.1 lb

## 2014-10-28 DIAGNOSIS — I10 Essential (primary) hypertension: Secondary | ICD-10-CM

## 2014-10-28 DIAGNOSIS — R197 Diarrhea, unspecified: Secondary | ICD-10-CM

## 2014-10-28 DIAGNOSIS — F0390 Unspecified dementia without behavioral disturbance: Secondary | ICD-10-CM | POA: Diagnosis not present

## 2014-10-28 NOTE — Progress Notes (Signed)
Subjective:    Patient ID: [name], female    DOB: March 31, 1932, 81 y.o.   MRN: [id number]  HPI Patient seen for follow-up regarding hypertension. Recent addition of amlodipine. She does confirm that she is taking this. She brings a log of several blood pressures and these vary considerably but most of these are below 151 systolic and all are below 80 diastolic. She denies any dizziness. No peripheral edema.  New problem of three-day history of diarrhea. She's actually had none today. She had stools which varied from soft to watery over the past couple days. Denied any fever. No nausea, vomiting, or diarrhea. Took some Kaopectate. No sick contacts. No bloody stools.  Dementia. Suspect Alzheimer's type. On Aricept 10 mg daily. No side effects. She appears to be taking this appropriately. No recent agitation.  Past Medical History  Diagnosis Date  . OSA (obstructive sleep apnea)   . History of bronchitis   . Other diseases of lung, not elsewhere classified   . Hypertension   . CAD (coronary artery disease)     LAD 30% 2006  . History of atrial flutter   . Cerebrovascular disease   . Hyperlipemia   . Obesity   . GERD (gastroesophageal reflux disease)   . Diverticulosis of colon   . IBS (irritable bowel syndrome)   . Colonic polyp   . Crohn's disease ([place])   . Hypothyroidism   . History of hyperparathyroidism   . Fibromyalgia   . Osteopenia   . History of headache   . Syncope   . Restless legs syndrome (RLS)   . Psychosis   . History of tuberculosis   . Radiation 05/2008    5000 cGy to left lower eyelid, parotid lymphatics and upper neck  . Memory disturbance   . Bipolar affective ([place])     History of psychosis  . Merkel cell tumor ([place])     Left eyelid   Past Surgical History  Procedure Laterality Date  . Parathyroid adenoma surgery    . Cholecystectomy    . Moh';s surg left lower eye lid surgery and lid reconstruction surgery  2/10  . Bladder suspension    .  Cataract extraction Bilateral   . Appendectomy    . Tonsillectomy and adenoidectomy    . Lumbar laminectomy    . Incision and drainage perirectal abscess    . Heel spur excision    . Nasal sinus surgery Right     Right maxillary  . Eye lid surgery  04/04/2011    [facility] by Dr. Norlene Duel  . Posterior cervical fusion/foraminotomy N/A 10/12/2013    Procedure: Cervical five-Thoracic two cervico-thoracic posterior fusion;  Surgeon: [name], MD;  Location: [facility];  Service: Neurosurgery;  Laterality: N/A;    reports that she quit smoking about 54 years ago. Her smoking use included Cigarettes. She quit after 6 years of use. She has never used smokeless tobacco. She reports that she does not drink alcohol or use illicit drugs. family history includes Arthritis in her mother; Colon cancer in an other family member; Heart failure in her father; Hypertension in her mother; Pulmonary embolism in an other family member; Ulcers in her mother. Allergies  Allergen Reactions  . Prednisone Other (See Comments)    Patient had psychiatric episode with hallucinations. Caused 3.5 week long hospitalization & loss of memory   . Carbamazepine Nausea Only and Other (See Comments)    Tegretol; dizziness, blurred vision, nausea, headache, insomnia  .  Celebrex [Celecoxib] Other (See Comments)    No relief from pain; ineffective  . Codeine Nausea And Vomiting  . Demerol [Meperidine] Nausea And Vomiting  . Diclofenac Sodium Other (See Comments)    severe headache  . Fentanyl Nausea And Vomiting and Other (See Comments)    IV Fentanyl; caused nausea and vomiting Fentanyl patch; caused chest pain, HBP, with second patch experienced vertigo and nausea  . Fluoxetine Hcl Nausea Only and Other (See Comments)    dizziness, blurred vision, nausea, headache, insomnia  . Hydromorphone Hcl Nausea And Vomiting  . Ibuprofen Other (See Comments)    Caused ulcers and colitis  . Indomethacin Nausea Only and Other (See  Comments)    dizziness, blurred vision, nausea, headache, insomnia  . Lisinopril Cough  . Meperidine Hcl Nausea And Vomiting  . Morphine Nausea And Vomiting  . Oruvail [Ketoprofen] Other (See Comments)    No relief from pain; ineffective  . Pregabalin Other (See Comments)    severe restless legs, insomnia  . Propranolol Hcl Nausea Only and Other (See Comments)    dizziness, blurred vision, nausea, headache, insomnia  . Refresh Lacri-Lube [Artificial Tears] Swelling    Caused swelling, redness, hard crust on eyelids   . Relafen [Nabumetone] Other (See Comments)    No relief from pain; ineffective  . Ropinirole Hcl Other (See Comments)    severe restless legs and insomnia  . Sulfamethoxazole Nausea Only and Other (See Comments)    gas, loose stools  . Bacitracin Hives, Itching and Rash    redness     Review of Systems  Constitutional: Negative for appetite change and unexpected weight change.  Respiratory: Negative for shortness of breath.   Cardiovascular: Negative for chest pain.  Gastrointestinal: Positive for diarrhea. Negative for nausea, vomiting, abdominal pain, blood in stool and abdominal distention.  Genitourinary: Negative for dysuria.  Neurological: Negative for dizziness.       Objective:   Physical Exam  Constitutional: She appears well-developed and well-nourished.  HENT:  Mouth/Throat: Oropharynx is clear and moist.  Neck: Neck supple. No thyromegaly present.  Cardiovascular: Normal rate and regular rhythm.   Pulmonary/Chest: Effort normal and breath sounds normal. No respiratory distress. She has no wheezes. She has no rales.  Abdominal: Soft. Bowel sounds are normal. She exhibits no distension and no mass. There is no tenderness. There is no rebound and no guarding.  Musculoskeletal: She exhibits no edema.  Neurological: She is alert.          Assessment & Plan:  #1 hypertension improved and at goal by today's reading. Continue amlodipine.  # 2  diarrhea. Appears to be resolving. Does not appear clinically dehydrated. Discussed appropriate food choices #3 dementia. Patient is on Aricept 10 mg daily. She is not sure she is seeing any changes in her memory since starting this. She denies any side effects. Consider repeat MMSE at follow-up visit in 2 months

## 2014-10-28 NOTE — Patient Instructions (Signed)

## 2014-10-28 NOTE — Progress Notes (Signed)
Pre visit review using our clinic review tool, if applicable. No additional management support is needed unless otherwise documented below in the visit note. 

## 2014-11-04 ENCOUNTER — Encounter: Payer: Self-pay | Admitting: Family Medicine

## 2014-11-04 ENCOUNTER — Ambulatory Visit (INDEPENDENT_AMBULATORY_CARE_PROVIDER_SITE_OTHER): Payer: Medicare Other | Admitting: Family Medicine

## 2014-11-04 VITALS — BP 130/80 | HR 75 | Temp 97.5°F | Wt 164.6 lb

## 2014-11-04 DIAGNOSIS — R197 Diarrhea, unspecified: Secondary | ICD-10-CM | POA: Diagnosis not present

## 2014-11-04 LAB — CBC WITH DIFFERENTIAL/PLATELET
BASOS PCT: 0.6 % (ref 0.0–3.0)
Basophils Absolute: 0 10*3/uL (ref 0.0–0.1)
EOS ABS: 0.2 10*3/uL (ref 0.0–0.7)
Eosinophils Relative: 4.5 % (ref 0.0–5.0)
HCT: 37.3 % (ref 36.0–46.0)
Hemoglobin: 12.6 g/dL (ref 12.0–15.0)
Lymphocytes Relative: 26.8 % (ref 12.0–46.0)
Lymphs Abs: 1.3 10*3/uL (ref 0.7–4.0)
MCHC: 33.8 g/dL (ref 30.0–36.0)
MCV: 94.7 fl (ref 78.0–100.0)
MONO ABS: 0.4 10*3/uL (ref 0.1–1.0)
Monocytes Relative: 8.1 % (ref 3.0–12.0)
NEUTROS ABS: 3 10*3/uL (ref 1.4–7.7)
NEUTROS PCT: 60 % (ref 43.0–77.0)
PLATELETS: 114 10*3/uL — AB (ref 150.0–400.0)
RBC: 3.94 Mil/uL (ref 3.87–5.11)
RDW: 14.3 % (ref 11.5–15.5)
WBC: 5 10*3/uL (ref 4.0–10.5)

## 2014-11-04 LAB — BASIC METABOLIC PANEL
BUN: 14 mg/dL (ref 6–23)
CALCIUM: 9.2 mg/dL (ref 8.4–10.5)
CHLORIDE: 103 meq/L (ref 96–112)
CO2: 29 meq/L (ref 19–32)
CREATININE: 0.8 mg/dL (ref 0.40–1.20)
GFR: 73.28 mL/min (ref >60.00)
Glucose, Bld: 165 mg/dL — ABNORMAL HIGH (ref 70–99)
Potassium: 4 mEq/L (ref 3.5–5.1)
Sodium: 141 mEq/L (ref 135–145)

## 2014-11-04 LAB — HEPATIC FUNCTION PANEL
ALT: 31 U/L (ref 0–35)
AST: 23 U/L (ref 0–37)
Albumin: 3.8 g/dL (ref 3.5–5.2)
Alkaline Phosphatase: 63 U/L (ref 39–117)
BILIRUBIN DIRECT: 0.1 mg/dL (ref 0.0–0.3)
BILIRUBIN TOTAL: 0.7 mg/dL (ref 0.2–1.2)
TOTAL PROTEIN: 6.5 g/dL (ref 6.0–8.3)

## 2014-11-04 MED ORDER — CHOLESTYRAMINE 4 GM/DOSE PO POWD: 4.0000 g | Freq: Every day | ORAL | Status: DC

## 2014-11-04 NOTE — Progress Notes (Signed)
Pre visit review using our clinic review tool, if applicable. No additional management support is needed unless otherwise documented below in the visit note. 

## 2014-11-04 NOTE — Progress Notes (Signed)
Subjective:    Patient ID: [name], female    DOB: 27-Jun-1932, 81 y.o.   MRN: [id number]  HPI Patient seen with complaints of three-day history of diarrhea. She was here recently (a week ago) and states had 3 days of diarrhea and apparently she did not have any further episodes until 3 days ago. She can relates non-bloody stools. No associated nausea or vomiting. No fever. She took Kaopectate without much relief. No recent travels. No recent antibiotics. She has some diffuse lower abdominal cramping. No dysuria.  Past Medical History  Diagnosis Date  . OSA (obstructive sleep apnea)   . History of bronchitis   . Other diseases of lung, not elsewhere classified   . Hypertension   . CAD (coronary artery disease)     LAD 30% 2006  . History of atrial flutter   . Cerebrovascular disease   . Hyperlipemia   . Obesity   . GERD (gastroesophageal reflux disease)   . Diverticulosis of colon   . IBS (irritable bowel syndrome)   . Colonic polyp   . Crohn's disease ([place])   . Hypothyroidism   . History of hyperparathyroidism   . Fibromyalgia   . Osteopenia   . History of headache   . Syncope   . Restless legs syndrome (RLS)   . Psychosis   . History of tuberculosis   . Radiation 05/2008    5000 cGy to left lower eyelid, parotid lymphatics and upper neck  . Memory disturbance   . Bipolar affective ([place])     History of psychosis  . Merkel cell tumor ([place])     Left eyelid   Past Surgical History  Procedure Laterality Date  . Parathyroid adenoma surgery    . Cholecystectomy    . Moh';s surg left lower eye lid surgery and lid reconstruction surgery  2/10  . Bladder suspension    . Cataract extraction Bilateral   . Appendectomy    . Tonsillectomy and adenoidectomy    . Lumbar laminectomy    . Incision and drainage perirectal abscess    . Heel spur excision    . Nasal sinus surgery Right     Right maxillary  . Eye lid surgery  04/04/2011    [facility] by Dr. Norlene Duel  . Posterior  cervical fusion/foraminotomy N/A 10/12/2013    Procedure: Cervical five-Thoracic two cervico-thoracic posterior fusion;  Surgeon: [name], MD;  Location: [place] [facility];  Service: Neurosurgery;  Laterality: N/A;    reports that she quit smoking about 54 years ago. Her smoking use included Cigarettes. She quit after 6 years of use. She has never used smokeless tobacco. She reports that she does not drink alcohol or use illicit drugs. family history includes Arthritis in her mother; Colon cancer in an other family member; Heart failure in her father; Hypertension in her mother; Pulmonary embolism in an other family member; Ulcers in her mother. Allergies  Allergen Reactions  . Prednisone Other (See Comments)    Patient had psychiatric episode with hallucinations. Caused 3.5 week long hospitalization & loss of memory   . Carbamazepine Nausea Only and Other (See Comments)    Tegretol; dizziness, blurred vision, nausea, headache, insomnia  . Celebrex [Celecoxib] Other (See Comments)    No relief from pain; ineffective  . Codeine Nausea And Vomiting  . Demerol [Meperidine] Nausea And Vomiting  . Diclofenac Sodium Other (See Comments)    severe headache  . Fentanyl Nausea And Vomiting and Other (See Comments)  IV Fentanyl; caused nausea and vomiting Fentanyl patch; caused chest pain, HBP, with second patch experienced vertigo and nausea  . Fluoxetine Hcl Nausea Only and Other (See Comments)    dizziness, blurred vision, nausea, headache, insomnia  . Hydromorphone Hcl Nausea And Vomiting  . Ibuprofen Other (See Comments)    Caused ulcers and colitis  . Indomethacin Nausea Only and Other (See Comments)    dizziness, blurred vision, nausea, headache, insomnia  . Lisinopril Cough  . Meperidine Hcl Nausea And Vomiting  . Morphine Nausea And Vomiting  . Oruvail [Ketoprofen] Other (See Comments)    No relief from pain; ineffective  . Pregabalin Other (See Comments)    severe restless legs,  insomnia  . Propranolol Hcl Nausea Only and Other (See Comments)    dizziness, blurred vision, nausea, headache, insomnia  . Refresh Lacri-Lube [Artificial Tears] Swelling    Caused swelling, redness, hard crust on eyelids   . Relafen [Nabumetone] Other (See Comments)    No relief from pain; ineffective  . Ropinirole Hcl Other (See Comments)    severe restless legs and insomnia  . Sulfamethoxazole Nausea Only and Other (See Comments)    gas, loose stools  . Bacitracin Hives, Itching and Rash    redness      Review of Systems  Constitutional: Negative for fever and chills.  Respiratory: Negative for cough.   Cardiovascular: Negative for chest pain.  Gastrointestinal: Positive for diarrhea. Negative for nausea, vomiting and abdominal distention.  Genitourinary: Negative for dysuria.  Neurological: Negative for dizziness, syncope and headaches.       Objective:   Physical Exam  Constitutional: She appears well-developed and well-nourished. No distress.  Cardiovascular: Normal rate and regular rhythm.   Pulmonary/Chest: Effort normal and breath sounds normal. No respiratory distress. She has no wheezes. She has no rales.  Abdominal: Soft. Bowel sounds are normal. She exhibits no distension and no mass. There is no tenderness. There is no rebound and no guarding.          Assessment & Plan:  Diarrhea. This apparently is a second recurrence recently. She's had 3 days of nonbloody diarrhea. No fever. No obvious etiology.Check labs with CBC, basic chemistries. Recent TSH normal. Check stool for C. Difficile. She does have prior cholecystectomy and has been on Questran in the past is encouraged to start back this to see if this helps. Follow-up promptly for any fever, abdominal pain, vomiting or worsening diarrhea

## 2014-11-04 NOTE — Patient Instructions (Addendum)

## 2014-11-07 ENCOUNTER — Telehealth: Payer: Self-pay | Admitting: Family Medicine

## 2014-11-07 NOTE — Telephone Encounter (Signed)
Patient husband came in with a sample. Pt husband states that the patient is still having nausea, vomiting, and diarrhea this morning

## 2014-11-08 ENCOUNTER — Telehealth: Payer: Self-pay

## 2014-11-08 MED ORDER — METRONIDAZOLE 500 MG PO TABS: 500.0000 mg | ORAL_TABLET | Freq: Three times a day (TID) | ORAL | Status: DC

## 2014-11-08 NOTE — Telephone Encounter (Signed)
CRITICAL VALUE: POSITIVE C.DIFF  RECEIVER: [name]  DATE:11/08/14 TIME: 1:25 pm  MD NOTIFIED: DR Sarajane Jews  MD RESPOND: Dr Sarajane Jews priscribe Flagyl 3 times a day #30  Pt is aware of the results also send Rx medication to the Pharmacy. Pt has an appt on Monday 11/14/14 with Dr [name] At 1:45

## 2014-11-09 LAB — CLOSTRIDIUM DIFFICILE BY PCR: Toxigenic C. Difficile by PCR: DETECTED — CR

## 2014-11-09 NOTE — Telephone Encounter (Signed)
Pt pick up Rx yesterday and shes doing better today

## 2014-11-14 ENCOUNTER — Encounter: Payer: Self-pay | Admitting: Family Medicine

## 2014-11-14 ENCOUNTER — Ambulatory Visit (INDEPENDENT_AMBULATORY_CARE_PROVIDER_SITE_OTHER): Payer: Medicare Other | Admitting: Family Medicine

## 2014-11-14 VITALS — BP 130/84 | HR 64 | Temp 98.0°F | Resp 16 | Ht 62.0 in | Wt 162.7 lb

## 2014-11-14 DIAGNOSIS — A0472 Enterocolitis due to Clostridium difficile, not specified as recurrent: Secondary | ICD-10-CM

## 2014-11-14 DIAGNOSIS — A047 Enterocolitis due to Clostridium difficile: Secondary | ICD-10-CM | POA: Diagnosis not present

## 2014-11-14 NOTE — Progress Notes (Signed)
Subjective:    Patient ID: [name], female    DOB: January 25, 1932, 81 y.o.   MRN: [id number]  HPI Patient was seen recently with recurrent diarrhea. Though she had not been on any recent antibiotics we checked C. difficile and this came back positive. Her other labs were unremarkable. Patient had metronidazole called in last week but apparently they did not pick this prescription up until today. She continues to have 4-6 stools per day. She has poor appetite but denies any nausea or vomiting. No bloody stools. No abdominal pain. No fever  Past Medical History  Diagnosis Date  . OSA (obstructive sleep apnea)   . History of bronchitis   . Other diseases of lung, not elsewhere classified   . Hypertension   . CAD (coronary artery disease)     LAD 30% 2006  . History of atrial flutter   . Cerebrovascular disease   . Hyperlipemia   . Obesity   . GERD (gastroesophageal reflux disease)   . Diverticulosis of colon   . IBS (irritable bowel syndrome)   . Colonic polyp   . Crohn's disease ([place])   . Hypothyroidism   . History of hyperparathyroidism   . Fibromyalgia   . Osteopenia   . History of headache   . Syncope   . Restless legs syndrome (RLS)   . Psychosis   . History of tuberculosis   . Radiation 05/2008    5000 cGy to left lower eyelid, parotid lymphatics and upper neck  . Memory disturbance   . Bipolar affective ([place])     History of psychosis  . Merkel cell tumor ([place])     Left eyelid   Past Surgical History  Procedure Laterality Date  . Parathyroid adenoma surgery    . Cholecystectomy    . Moh';s surg left lower eye lid surgery and lid reconstruction surgery  2/10  . Bladder suspension    . Cataract extraction Bilateral   . Appendectomy    . Tonsillectomy and adenoidectomy    . Lumbar laminectomy    . Incision and drainage perirectal abscess    . Heel spur excision    . Nasal sinus surgery Right     Right maxillary  . Eye lid surgery  04/04/2011    [facility] by Dr. Norlene Duel  . Posterior cervical fusion/foraminotomy N/A 10/12/2013    Procedure: Cervical five-Thoracic two cervico-thoracic posterior fusion;  Surgeon: [name], MD;  Location: [place] [facility];  Service: Neurosurgery;  Laterality: N/A;    reports that she quit smoking about 54 years ago. Her smoking use included Cigarettes. She quit after 6 years of use. She has never used smokeless tobacco. She reports that she does not drink alcohol or use illicit drugs. family history includes Arthritis in her mother; Colon cancer in an other family member; Heart failure in her father; Hypertension in her mother; Pulmonary embolism in an other family member; Ulcers in her mother. Allergies  Allergen Reactions  . Prednisone Other (See Comments)    Patient had psychiatric episode with hallucinations. Caused 3.5 week long hospitalization & loss of memory   . Carbamazepine Nausea Only and Other (See Comments)    Tegretol; dizziness, blurred vision, nausea, headache, insomnia  . Celebrex [Celecoxib] Other (See Comments)    No relief from pain; ineffective  . Codeine Nausea And Vomiting  . Demerol [Meperidine] Nausea And Vomiting  . Diclofenac Sodium Other (See Comments)    severe headache  . Fentanyl Nausea And  Vomiting and Other (See Comments)    IV Fentanyl; caused nausea and vomiting Fentanyl patch; caused chest pain, HBP, with second patch experienced vertigo and nausea  . Fluoxetine Hcl Nausea Only and Other (See Comments)    dizziness, blurred vision, nausea, headache, insomnia  . Hydromorphone Hcl Nausea And Vomiting  . Ibuprofen Other (See Comments)    Caused ulcers and colitis  . Indomethacin Nausea Only and Other (See Comments)    dizziness, blurred vision, nausea, headache, insomnia  . Lisinopril Cough  . Meperidine Hcl Nausea And Vomiting  . Morphine Nausea And Vomiting  . Oruvail [Ketoprofen] Other (See Comments)    No relief from pain; ineffective  . Pregabalin Other (See Comments)     severe restless legs, insomnia  . Propranolol Hcl Nausea Only and Other (See Comments)    dizziness, blurred vision, nausea, headache, insomnia  . Refresh Lacri-Lube [Artificial Tears] Swelling    Caused swelling, redness, hard crust on eyelids   . Relafen [Nabumetone] Other (See Comments)    No relief from pain; ineffective  . Ropinirole Hcl Other (See Comments)    severe restless legs and insomnia  . Sulfamethoxazole Nausea Only and Other (See Comments)    gas, loose stools  . Bacitracin Hives, Itching and Rash    redness      Review of Systems  Constitutional: Negative for fever, chills, appetite change and unexpected weight change.  Cardiovascular: Negative for chest pain.  Gastrointestinal: Positive for diarrhea. Negative for nausea, vomiting and abdominal pain.       Objective:   Physical Exam  Constitutional: She appears well-developed and well-nourished.  HENT:  Mouth/Throat: Oropharynx is clear and moist.  Neck: Neck supple.  Cardiovascular: Normal rate and regular rhythm.   Pulmonary/Chest: Effort normal and breath sounds normal. No respiratory distress. She has no wheezes. She has no rales.  Abdominal: Soft. There is no tenderness.  Lymphadenopathy:    She has no cervical adenopathy.          Assessment & Plan:  C. difficile diarrhea. Patient does not appear dehydrated or toxic in any way. She has  unfortunately  not started metronidazole yet. Start metronidazole 500 mg 3 times a day. If diarrhea not fully resolved in 10 days she will be in touch.

## 2014-11-14 NOTE — Patient Instructions (Signed)
Clostridium Difficile Infection Clostridium difficile (C. difficile or C. diff) is a bacterium normally found in the intestinal tract or colon. C. difficile infection causes diarrhea and sometimes a severe disease called pseudomembranous colitis (C. difficile colitis). C. difficile colitis can damage the lining of the colon or cause the colon to become very large (toxic megacolon). Older adults and people with certain medical conditions have a greater risk of getting C. difficile infections. CAUSES The balance of bacteria in your colon can change when you are sick, especially when taking antibiotic medicine. Taking antibiotics may allow the C. difficile to grow, multiply, and make a toxin that causes C. difficile infection.  SYMPTOMS  Diarrhea.  Fever.  Fatigue.  Loss of appetite.  Nausea.  Abdominal swelling, pain, or tenderness.  Dehydration. DIAGNOSIS Your health care provider may suspect C. difficile infection based on your symptoms and if you have taken antibiotics recently. Your health care provider may also order:  A lab test that can detect the toxin in your stool.  A sigmoidoscopy or colonoscopy to look at the appearance of your colon. These procedures involve passing an instrument through your rectum to look at the inside of your colon. Your health care provider will help determine if these tests are necessary. TREATMENT Treatment may include:  Taking antibiotics that keep C. difficile from growing.  Stopping the antibiotics you were on before the C. difficile infection began. Only do this if instructed to do so by your health care provider.  IV fluids and correction of electrolyte imbalance.  Surgery to remove the infected part of the intestines. This is rare. HOME CARE INSTRUCTIONS  Drink enough fluids to keep your urine clear or pale yellow. Avoid milk, caffeine, and alcohol.  Ask your health care provider for specific rehydration instructions.  Eat small,  frequent meals rather than large meals.  Take your antibiotics as directed. Finish them even if you start to feel better.  Do not use medicines to slow diarrhea. This could delay healing or cause problems.  Wash your hands thoroughly after using the bathroom and before preparing food. Make sure people who live with you wash their hands often, too.  Clean all surfaces with a product that contains chlorine bleach. SEEK MEDICAL CARE IF:  Your diarrhea lasts longer than expected or comes back after you finish your antibiotic medicine for the C. difficile infection.  You have trouble staying hydrated.  You have a fever. SEEK IMMEDIATE MEDICAL CARE IF:  You have increasing abdominal pain or tenderness.  You have blood in your stools, or your stools look dark black and tarry.  You cannot eat or drink without vomiting.   This information is not intended to replace advice given to you by your health care provider. Make sure you discuss any questions you have with your health care provider.   Document Released: 10/03/2004 Document Revised: 01/14/2014 Document Reviewed: 06/27/2014 Elsevier Interactive Patient Education [employer].

## 2014-11-14 NOTE — Progress Notes (Signed)
Pre visit review using our clinic review tool, if applicable. No additional management support is needed unless otherwise documented below in the visit note. 

## 2014-11-15 ENCOUNTER — Telehealth: Payer: Self-pay | Admitting: Family Medicine

## 2014-11-15 NOTE — Telephone Encounter (Signed)
As you are aware, patient was placed on flagyl by Dr. Sarajane Jews last week. Please advise on continued symptoms or if warrants appointment.

## 2014-11-15 NOTE — Telephone Encounter (Signed)
[facility]     Patient Name: [name]    Client Site [facility]    Physician Carolann Littler     Contact Type Call    Call Type Triage / Clinical    Caller Name Barnabas Lister   Gender: Female Relationship To Patient Spouse  DOB: August 04, 1932  Appointment Disposition EMR Caller Not Reached  Age: 81 Y 66 M 29 D Info pasted into Epic Yes  Return Phone Number: [phone] (Secondary), [phone] (Alternate) Return Phone Number [phone] (Alternate)  Address:  Chief Complaint Diarrhea  City/State/Zip: [place] [address] Initial Comment Caller states his wife has been having diarrhea and she was previously seen for this and it now getting better. She was placed on Flagyl.     Nurse Assessment     Guidelines      Guideline Title Affirmed Question Affirmed Notes Nurse Date/Time (Eastern Time)        Disp. Time Eilene Ghazi Time) Disposition Final User   11/15/2014 4:17:15 PM Attempt made - message left  Venetia Maxon, RN, Manuela Schwartz   11/15/2014 4:18:26 PM Attempt made - no message left  Venetia Maxon, RN, Manuela Schwartz   11/15/2014 4:33:11 PM FINAL ATTEMPT MADE - no message left Yes Venetia Maxon, RN, Manuela Schwartz

## 2014-11-16 ENCOUNTER — Other Ambulatory Visit: Payer: Self-pay

## 2014-11-16 MED ORDER — METRONIDAZOLE 500 MG PO TABS: 500.0000 mg | ORAL_TABLET | Freq: Three times a day (TID) | ORAL | Status: DC

## 2014-11-16 NOTE — Telephone Encounter (Signed)
[name] is in the office with her Husband today for his f/u appt. Pt c/o persistent diarrhea. Dr. [name] is advising her in person if changes in medication need to be made or if pt has actually took prescription.

## 2014-11-16 NOTE — Telephone Encounter (Signed)
I have tried calling pt with NA, tried calling [name] (daughter-POA) with busy signal only. Need to try back later.

## 2014-11-16 NOTE — Telephone Encounter (Signed)
This patient has some dementia and unfortunately so does her husband. We need to confirm that she is actually taking her metronidazole. She has a daughter who lives out of state who is been involved with her care and we need to make her aware of what is going on. We really need an advocate where they stay in an assisted living place to make sure she is taking her medication if possible

## 2014-11-16 NOTE — Telephone Encounter (Signed)
Pt only had 2 tablets left of medication. He refilled Flagyl for another 7 days TID.

## 2014-11-19 ENCOUNTER — Emergency Department (HOSPITAL_COMMUNITY)
Admission: EM | Admit: 2014-11-19 | Discharge: 2014-11-19 | Disposition: A | Discharge status: Home / Self Care | Payer: Medicare Other | Attending: Emergency Medicine | Admitting: Emergency Medicine

## 2014-11-19 ENCOUNTER — Encounter (HOSPITAL_COMMUNITY): Payer: Self-pay | Admitting: Emergency Medicine

## 2014-11-19 ENCOUNTER — Emergency Department (HOSPITAL_COMMUNITY): Payer: Medicare Other

## 2014-11-19 DIAGNOSIS — Z87891 Personal history of nicotine dependence: Secondary | ICD-10-CM | POA: Insufficient documentation

## 2014-11-19 DIAGNOSIS — I10 Essential (primary) hypertension: Secondary | ICD-10-CM | POA: Insufficient documentation

## 2014-11-19 DIAGNOSIS — E785 Hyperlipidemia, unspecified: Secondary | ICD-10-CM | POA: Insufficient documentation

## 2014-11-19 DIAGNOSIS — N281 Cyst of kidney, acquired: Secondary | ICD-10-CM | POA: Diagnosis not present

## 2014-11-19 DIAGNOSIS — Z86010 Personal history of colonic polyps: Secondary | ICD-10-CM | POA: Diagnosis not present

## 2014-11-19 DIAGNOSIS — G2581 Restless legs syndrome: Secondary | ICD-10-CM | POA: Diagnosis not present

## 2014-11-19 DIAGNOSIS — Z79899 Other long term (current) drug therapy: Secondary | ICD-10-CM | POA: Insufficient documentation

## 2014-11-19 DIAGNOSIS — I2510 Atherosclerotic heart disease of native coronary artery without angina pectoris: Secondary | ICD-10-CM | POA: Insufficient documentation

## 2014-11-19 DIAGNOSIS — E039 Hypothyroidism, unspecified: Secondary | ICD-10-CM | POA: Diagnosis not present

## 2014-11-19 DIAGNOSIS — M797 Fibromyalgia: Secondary | ICD-10-CM | POA: Diagnosis not present

## 2014-11-19 DIAGNOSIS — Z7982 Long term (current) use of aspirin: Secondary | ICD-10-CM | POA: Insufficient documentation

## 2014-11-19 DIAGNOSIS — K529 Noninfective gastroenteritis and colitis, unspecified: Secondary | ICD-10-CM | POA: Diagnosis not present

## 2014-11-19 DIAGNOSIS — Z7951 Long term (current) use of inhaled steroids: Secondary | ICD-10-CM | POA: Diagnosis not present

## 2014-11-19 DIAGNOSIS — R1032 Left lower quadrant pain: Secondary | ICD-10-CM | POA: Insufficient documentation

## 2014-11-19 DIAGNOSIS — R197 Diarrhea, unspecified: Secondary | ICD-10-CM | POA: Insufficient documentation

## 2014-11-19 DIAGNOSIS — Z85821 Personal history of Merkel cell carcinoma: Secondary | ICD-10-CM | POA: Insufficient documentation

## 2014-11-19 DIAGNOSIS — E669 Obesity, unspecified: Secondary | ICD-10-CM | POA: Diagnosis not present

## 2014-11-19 DIAGNOSIS — K2970 Gastritis, unspecified, without bleeding: Secondary | ICD-10-CM | POA: Diagnosis not present

## 2014-11-19 DIAGNOSIS — Z792 Long term (current) use of antibiotics: Secondary | ICD-10-CM | POA: Diagnosis not present

## 2014-11-19 DIAGNOSIS — Z8659 Personal history of other mental and behavioral disorders: Secondary | ICD-10-CM | POA: Insufficient documentation

## 2014-11-19 DIAGNOSIS — K5090 Crohn's disease, unspecified, without complications: Secondary | ICD-10-CM | POA: Diagnosis not present

## 2014-11-19 LAB — CBC WITH DIFFERENTIAL/PLATELET
Basophils Absolute: 0 10*3/uL (ref 0.0–0.1)
Basophils Relative: 1 %
EOS ABS: 0.1 10*3/uL (ref 0.0–0.7)
Eosinophils Relative: 2 %
HEMATOCRIT: 39.6 % (ref 36.0–46.0)
HEMOGLOBIN: 13.1 g/dL (ref 12.0–15.0)
LYMPHS ABS: 2.3 10*3/uL (ref 0.7–4.0)
Lymphocytes Relative: 41 %
MCH: 31.7 pg (ref 26.0–34.0)
MCHC: 33.1 g/dL (ref 30.0–36.0)
MCV: 95.9 fL (ref 78.0–100.0)
MONOS PCT: 12 %
Monocytes Absolute: 0.7 10*3/uL (ref 0.1–1.0)
NEUTROS ABS: 2.6 10*3/uL (ref 1.7–7.7)
NEUTROS PCT: 46 %
Platelets: 191 10*3/uL (ref 150–400)
RBC: 4.13 MIL/uL (ref 3.87–5.11)
RDW: 14.1 % (ref 11.5–15.5)
WBC: 5.8 10*3/uL (ref 4.0–10.5)

## 2014-11-19 LAB — URINALYSIS, ROUTINE W REFLEX MICROSCOPIC
Bilirubin Urine: NEGATIVE
GLUCOSE, UA: NEGATIVE mg/dL
Hgb urine dipstick: NEGATIVE
KETONES UR: NEGATIVE mg/dL
LEUKOCYTES UA: NEGATIVE
NITRITE: NEGATIVE
PROTEIN: NEGATIVE mg/dL
Specific Gravity, Urine: >1.046 — ABNORMAL HIGH (ref 1.005–1.030)
Urobilinogen, UA: 0.2 mg/dL (ref 0.0–1.0)
pH: 5 (ref 5.0–8.0)

## 2014-11-19 LAB — COMPREHENSIVE METABOLIC PANEL
ALBUMIN: 4 g/dL (ref 3.5–5.0)
ALT: 25 U/L (ref 14–54)
AST: 32 U/L (ref 15–41)
Alkaline Phosphatase: 46 U/L (ref 38–126)
Anion gap: 8 (ref 5–15)
BUN: 23 mg/dL — AB (ref 6–20)
CHLORIDE: 104 mmol/L (ref 101–111)
CO2: 29 mmol/L (ref 22–32)
CREATININE: 0.84 mg/dL (ref 0.44–1.00)
Calcium: 9.6 mg/dL (ref 8.9–10.3)
GFR calc Af Amer: >60 mL/min (ref >60)
GFR calc non Af Amer: >60 mL/min (ref >60)
GLUCOSE: 95 mg/dL (ref 65–99)
POTASSIUM: 4.3 mmol/L (ref 3.5–5.1)
SODIUM: 141 mmol/L (ref 135–145)
Total Bilirubin: 0.6 mg/dL (ref 0.3–1.2)
Total Protein: 6.9 g/dL (ref 6.5–8.1)

## 2014-11-19 LAB — I-STAT CG4 LACTIC ACID, ED: Lactic Acid, Venous: 0.5 mmol/L (ref 0.5–2.0)

## 2014-11-19 LAB — LIPASE, BLOOD: LIPASE: 27 U/L (ref 11–51)

## 2014-11-19 MED ORDER — IOHEXOL 300 MG/ML  SOLN
100.0000 mL | Freq: Once | INTRAMUSCULAR | Status: AC | PRN
  Administered 2014-11-19: 100 mL via INTRAVENOUS

## 2014-11-19 MED ORDER — IOHEXOL 300 MG/ML  SOLN
25.0000 mL | Freq: Once | INTRAMUSCULAR | Status: AC | PRN
  Administered 2014-11-19: 25 mL via ORAL

## 2014-11-19 MED ORDER — SODIUM CHLORIDE 0.9 % IV SOLN
Freq: Once | INTRAVENOUS | Status: AC
  Administered 2014-11-19: 13:00:00 via INTRAVENOUS

## 2014-11-19 MED ORDER — SODIUM CHLORIDE 0.9 % IV BOLUS (SEPSIS)
500.0000 mL | Freq: Once | INTRAVENOUS | Status: AC
  Administered 2014-11-19: 500 mL via INTRAVENOUS

## 2014-11-19 NOTE — ED Notes (Signed)
Bed: [id number] Expected date: 11/19/14 Expected time: 11:34 AM Means of arrival: Ambulance Comments: C-diff

## 2014-11-19 NOTE — ED Notes (Signed)
Pt from home [facility] via EMS-Per EMS, pt c/o diarrhea x1 month and was dx with cdiff in October. Pt is currently taking Flagyl x1 month. Pt c/o LLQ pain that started this am but sts she has not had abd pain prior. Pt is A&O and ambulatory w/o difficulty. Pt in NAD

## 2014-11-19 NOTE — ED Provider Notes (Signed)
CSN: [id number]     Arrival date & time 11/19/14  1141 History   First MD Initiated Contact with Patient 11/19/14 1213     Chief Complaint  Patient presents with  . Diarrhea     (Consider location/radiation/quality/duration/timing/severity/associated sxs/prior Treatment) HPI Comments: 81 y.o. Female with history of HTN, CAD, atrial flutter, Crohn's disease, fibromyalgia presents for left lower quadrant abdominal pain.  Patient reports that she has had several weeks of diarrhea and that last month was diagnosed with C. Diff colitis and has been taking Flagyl for this reason which she is still on.  She has been following up outpatient for this issue with her GI doctor.  She says today she developed pain in her LLQ and says that it feels similar to when she has had diverticulitis and so she felt she should get it checked out.  She reports she is able to eat and drink without difficulty.  Denies fever or chills.  Denies pain before today.   Past Medical History  Diagnosis Date  . OSA (obstructive sleep apnea)   . History of bronchitis   . Other diseases of lung, not elsewhere classified   . Hypertension   . CAD (coronary artery disease)     LAD 30% 2006  . History of atrial flutter   . Cerebrovascular disease   . Hyperlipemia   . Obesity   . GERD (gastroesophageal reflux disease)   . Diverticulosis of colon   . IBS (irritable bowel syndrome)   . Colonic polyp   . Crohn's disease ([place])   . Hypothyroidism   . History of hyperparathyroidism   . Fibromyalgia   . Osteopenia   . History of headache   . Syncope   . Restless legs syndrome (RLS)   . Psychosis   . History of tuberculosis   . Radiation 05/2008    5000 cGy to left lower eyelid, parotid lymphatics and upper neck  . Memory disturbance   . Bipolar affective ([place])     History of psychosis  . Merkel cell tumor ([place])     Left eyelid   Past Surgical History  Procedure Laterality Date  . Parathyroid adenoma surgery    .  Cholecystectomy    . Moh';s surg left lower eye lid surgery and lid reconstruction surgery  2/10  . Bladder suspension    . Cataract extraction Bilateral   . Appendectomy    . Tonsillectomy and adenoidectomy    . Lumbar laminectomy    . Incision and drainage perirectal abscess    . Heel spur excision    . Nasal sinus surgery Right     Right maxillary  . Eye lid surgery  04/04/2011    [facility] by Dr. Norlene Duel  . Posterior cervical fusion/foraminotomy N/A 10/12/2013    Procedure: Cervical five-Thoracic two cervico-thoracic posterior fusion;  Surgeon: [name], MD;  Location: [facility];  Service: Neurosurgery;  Laterality: N/A;   Family History  Problem Relation Age of Onset  . Colon cancer      grandmother  . Arthritis Mother   . Hypertension Mother   . Ulcers Mother   . Heart failure Father   . Pulmonary embolism     Social History  Substance Use Topics  . Smoking status: Former Smoker -- 6 years    Types: Cigarettes    Quit date: 01/08/1960  . Smokeless tobacco: Never Used  . Alcohol Use: No   OB History    No data available  Review of Systems  Constitutional: Negative for fever, chills, appetite change and fatigue.  HENT: Negative for congestion, postnasal drip, rhinorrhea and trouble swallowing.   Eyes: Negative for pain and visual disturbance.  Respiratory: Negative for cough, chest tightness and shortness of breath.   Cardiovascular: Negative for chest pain and palpitations.  Gastrointestinal: Positive for abdominal pain (LLQ) and diarrhea. Negative for nausea, vomiting, constipation and blood in stool.  Genitourinary: Negative for dysuria, urgency, flank pain and pelvic pain.  Musculoskeletal: Negative for myalgias and back pain.  Skin: Negative for rash.  Neurological: Negative for dizziness, weakness, light-headedness, numbness and headaches.  Hematological: Does not bruise/bleed easily.      Allergies  Prednisone; Carbamazepine; Celebrex; Codeine;  Demerol; Diclofenac sodium; Fentanyl; Fluoxetine hcl; Hydromorphone hcl; Ibuprofen; Indomethacin; Lisinopril; Meperidine hcl; Morphine; [place]; Pregabalin; Propranolol hcl; Refresh lacri-lube; Relafen; Ropinirole hcl; Sulfamethoxazole; and Bacitracin  Home Medications   Prior to Admission medications   Medication Sig Start Date End Date Taking? Authorizing Provider  amLODipine (NORVASC) 2.5 MG tablet Take 1 tablet (2.5 mg total) by mouth daily. 10/03/14  Yes [name], MD  aspirin EC 81 MG tablet Take 81 mg by mouth every evening.   Yes Historical Provider, MD  atorvastatin (LIPITOR) 40 MG tablet TAKE 1 TABLET DAILY 07/12/14  Yes [name], MD  Biotin 5000 MCG TABS Take 5,000 mcg by mouth every morning.    Yes Historical Provider, MD  Calcium-Magnesium 500-250 MG TABS Take 2 tablets by mouth daily at 6 PM.  02/18/11  Yes Tammy S Parrett, NP  carvedilol (COREG) 25 MG tablet Take 1 tablet (25 mg total) by mouth 2 (two) times daily with a meal. PATIENT NEEDS TO CONTACT OFFICE FOR ADDITIONAL REFILLS 08/23/14  Yes Minus Breeding, MD  Cholecalciferol (VITAMIN D3) 2000 UNITS TABS Take 2,000 Units by mouth daily at 12 noon.    Yes Historical Provider, MD  cholestyramine Lucrezia Starch) 4 GM/DOSE powder Take 1 packet (4 g total) by mouth daily with breakfast. 2 scoops in water at 7 am 11/04/14  Yes [name], MD  co-enzyme Q-10 50 MG capsule Take 50 mg by mouth daily at 6 PM.    Yes Historical Provider, MD  donepezil (ARICEPT) 5 MG tablet Take 2 tablets (10 mg total) by mouth daily. Take 2 tablets (10 mg) at 7 am 08/22/14  Yes [name], MD  Evening Primrose Oil 500 MG CAPS Take 500 mg by mouth 2 (two) times daily.    Yes Historical Provider, MD  fluticasone (FLONASE) 50 MCG/ACT nasal spray Place 2 sprays into both nostrils at bedtime.   Yes Historical Provider, MD  guaifenesin (HUMIBID E) 400 MG TABS Take 400 mg by mouth 2 (two) times daily.    Yes Historical Provider, MD  ketotifen  (THERA TEARS ALLERGY) 0.025 % ophthalmic solution Place 1 drop into both eyes daily as needed (dry eyes).   Yes Historical Provider, MD  levothyroxine (SYNTHROID, LEVOTHROID) 88 MCG tablet Take 1 tablet (88 mcg total) by mouth daily before breakfast. 07/19/14  Yes [name], MD  lidocaine (LIDODERM) 5 % Place 1 patch onto the skin every 12 (twelve) hours as needed (back pain). Remove & Discard patch within 12 hours or as directed by MD   Yes Historical Provider, MD  mesalamine (LIALDA) 1.2 G EC tablet Take 3.6 g by mouth daily with lunch.    Yes Historical Provider, MD  metroNIDAZOLE (FLAGYL) 500 MG tablet Take 1 tablet (500 mg total) by  mouth 3 (three) times daily. 11/16/14  Yes [name], MD  Multiple Vitamins-Minerals (SUPER VITA-MINS PO) Take 1 tablet by mouth daily at 12 noon.    Yes Historical Provider, MD  Omega 3 1000 MG CAPS Take 1,000 mg by mouth 2 (two) times daily.   Yes Historical Provider, MD  pramipexole (MIRAPEX) 0.5 MG tablet TAKE 1 TABLET TWICE A DAY (NEED OFFICE VISIT) 09/05/14  Yes [name], MD  PROBIOTIC CAPS Take 1 capsule by mouth daily. 02/18/11  Yes Tammy S Parrett, NP  Simethicone (PHAZYME PO) Take 1-2 tablets by mouth 2 (two) times daily. Take 1 tablet at noon and 2 tablets at 4pm   Yes Historical Provider, MD  traMADol (ULTRAM) 50 MG tablet Take 1 tablet (50 mg total) by mouth every 6 (six) hours as needed. Patient taking differently: Take 50 mg by mouth every 6 (six) hours as needed for moderate pain.  11/25/13  Yes [name], MD  vitamin C (ASCORBIC ACID) 500 MG tablet Take 500 mg by mouth daily at 12 noon.    Yes Historical Provider, MD   BP 143/55 mmHg  Pulse 74  Temp(Src) 98.3 F (36.8 C) (Oral)  Resp 16  SpO2 96% Physical Exam  Constitutional: She is oriented to person, place, and time. She appears well-developed and well-nourished. No distress.  HENT:  Head: Normocephalic and atraumatic.  Right Ear: External ear normal.  Left  Ear: External ear normal.  Nose: Nose normal.  Mouth/Throat: Oropharynx is clear and moist. No oropharyngeal exudate.  Eyes: EOM are normal. Pupils are equal, round, and reactive to light.  Neck: Normal range of motion. Neck supple.  Cardiovascular: Normal rate, normal heart sounds and intact distal pulses.   No murmur heard. Pulmonary/Chest: Effort normal. No respiratory distress. She has no wheezes. She has no rales.  Abdominal: Soft. She exhibits no distension and no mass. There is tenderness (mild LLQ tenderness). There is no rebound and no guarding.  Musculoskeletal: Normal range of motion. She exhibits no edema or tenderness.  Neurological: She is alert and oriented to person, place, and time.  Skin: Skin is warm and dry. No rash noted. She is not diaphoretic.  Vitals reviewed.   ED Course  Procedures (including critical care time) Labs Review Labs Reviewed  COMPREHENSIVE METABOLIC PANEL - Abnormal; Notable for the following:    BUN 23 (*)    All other components within normal limits  URINALYSIS, ROUTINE W REFLEX MICROSCOPIC (NOT AT [facility]) - Abnormal; Notable for the following:    Specific Gravity, Urine >1.046 (*)    All other components within normal limits  CBC WITH DIFFERENTIAL/PLATELET  LIPASE, BLOOD  I-STAT CG4 LACTIC ACID, ED  I-STAT CG4 LACTIC ACID, ED    Imaging Review Ct Abdomen Pelvis W Contrast  11/19/2014  CLINICAL DATA:  Left lower quadrant pain beginning this morning with diarrhea. History of diverticulitis. EXAM: CT ABDOMEN AND PELVIS WITH CONTRAST TECHNIQUE: Multidetector CT imaging of the abdomen and pelvis was performed using the standard protocol following bolus administration of intravenous contrast. CONTRAST:  78m OMNIPAQUE IOHEXOL 300 MG/ML SOLN, 1032mOMNIPAQUE IOHEXOL 300 MG/ML SOLN COMPARISON:  07/29/2014 and 12/24/2004 as well as chest CT 10/22/2007 FINDINGS: Minimal scarring over the right base. Abdominal images demonstrate evidence of previous  cholecystectomy. Dilatation of the common bile duct and central intrahepatic ducts without significant change likely related to patient's postcholecystectomy state. The spleen, pancreas and adrenal glands are normal. Stomach is normal. Appendix has been surgically  resected. Kidneys are normal in size without nephrolithiasis. There are a few bilateral parapelvic cysts as well as a few sub cm renal cortical hypodensities too small to characterize but likely cysts. Ureters are normal. Moderate calcified plaque over the abdominal aorta. Back containing umbilical hernia with only subtle stranding of the herniated fat improved compared to the prior exam. No bowel herniation through the fascial defect. Moderate diverticulosis of the sigmoid colon without active inflammation. Small bowel is within normal. Mesentery is within normal without free fluid or inflammatory change. Pelvic images demonstrate the uterus, ovaries, bladder and rectum to be within normal. There are degenerative changes of the spine and hips. Curvature of the thoracolumbar spine convex left. IMPRESSION: No acute findings in the abdomen/pelvis. Moderate diverticulosis of the sigmoid colon without active inflammation. Stable umbilical hernia containing only peritoneal fat. Left stranding of the herniated fat compared to the prior exam. No bowel herniation. Bilateral parapelvic renal cysts and a few small sub cm renal cortical hypodensities too small to characterize, but likely cysts. Electronically Signed   By: Marin Olp M.D.   On: 11/19/2014 14:14   I have personally reviewed and evaluated these images and lab results as part of my medical decision-making.   EKG Interpretation None      MDM  Patient seen and evaluated in stable condition.  Tolerating normal diet per patient.  Laboratory results unremarkable other than elevated urine specific gravity but patient taking in oral fluids and was instructed to keep herself well hydrated.  CT  without acute process.  Patient well appearing.  Discussed results with patient and husband who reported that they would be able to arrange close outpatient follow up for reevaluation.  Strict return precautions given.  Patient discharged home in stable condition. Final diagnoses:  Diarrhea, unspecified type    1. Left lower quadrant abdominal pain 2. Chronic diarrhea    Harvel Quale, MD 11/20/14 [phone]

## 2014-11-19 NOTE — ED Notes (Signed)
Patient will let us know when she can urinate 

## 2014-11-19 NOTE — ED Notes (Signed)
Pt from home c/o LLQ pain that started this am. Pt adds that she had last diarrhea episode this am also. Pt denies blood in stool or urine. Pt is able to eat and drink normally and denies emesis. Pt is A&O and in NAD

## 2014-11-19 NOTE — Discharge Instructions (Signed)
Continue the outpatient treatment of your diarrhea as directed by your physician.  Make an appointment for follow up for your left lower quadrant abdominal pain and so that your abdomen can be reexamined.  Return with fever, inability to eat/drink, increased abdominal pain, or other new concerning symptoms.  Diarrhea Diarrhea is frequent loose and watery bowel movements. It can cause you to feel weak and dehydrated. Dehydration can cause you to become tired and thirsty, have a dry mouth, and have decreased urination that often is dark yellow. Diarrhea is a sign of another problem, most often an infection that will not last long. In most cases, diarrhea typically lasts 2-3 days. However, it can last longer if it is a sign of something more serious. It is important to treat your diarrhea as directed by your caregiver to lessen or prevent future episodes of diarrhea. CAUSES  Some common causes include:  Gastrointestinal infections caused by viruses, bacteria, or parasites.  Food poisoning or food allergies.  Certain medicines, such as antibiotics, chemotherapy, and laxatives.  Artificial sweeteners and fructose.  Digestive disorders. HOME CARE INSTRUCTIONS  Ensure adequate fluid intake (hydration): Have 1 cup (8 oz) of fluid for each diarrhea episode. Avoid fluids that contain simple sugars or sports drinks, fruit juices, whole milk products, and sodas. Your urine should be clear or pale yellow if you are drinking enough fluids. Hydrate with an oral rehydration solution that you can purchase at pharmacies, retail stores, and online. You can prepare an oral rehydration solution at home by mixing the following ingredients together:   - tsp table salt.   tsp baking soda.   tsp salt substitute containing potassium chloride.  1  tablespoons sugar.  1 L (34 oz) of water.  Certain foods and beverages may increase the speed at which food moves through the gastrointestinal (GI) tract. These foods  and beverages should be avoided and include:  Caffeinated and alcoholic beverages.  High-fiber foods, such as raw fruits and vegetables, nuts, seeds, and whole grain breads and cereals.  Foods and beverages sweetened with sugar alcohols, such as xylitol, sorbitol, and mannitol.  Some foods may be well tolerated and may help thicken stool including:  Starchy foods, such as rice, toast, pasta, low-sugar cereal, oatmeal, grits, baked potatoes, crackers, and bagels.  Bananas.  Applesauce.  Add probiotic-rich foods to help increase healthy bacteria in the GI tract, such as yogurt and fermented milk products.  Wash your hands well after each diarrhea episode.  Only take over-the-counter or prescription medicines as directed by your caregiver.  Take a warm bath to relieve any burning or pain from frequent diarrhea episodes. SEEK IMMEDIATE MEDICAL CARE IF:   You are unable to keep fluids down.  You have persistent vomiting.  You have blood in your stool, or your stools are black and tarry.  You do not urinate in 6-8 hours, or there is only a small amount of very dark urine.  You have abdominal pain that increases or localizes.  You have weakness, dizziness, confusion, or light-headedness.  You have a severe headache.  Your diarrhea gets worse or does not get better.  You have a fever or persistent symptoms for more than 2-3 days.  You have a fever and your symptoms suddenly get worse. MAKE SURE YOU:   Understand these instructions.  Will watch your condition.  Will get help right away if you are not doing well or get worse.   This information is not intended to replace advice given  to you by your health care provider. Make sure you discuss any questions you have with your health care provider.   Document Released: 12/14/2001 Document Revised: 01/14/2014 Document Reviewed: 09/01/2011 Elsevier Interactive Patient Education [employer].

## 2014-11-21 ENCOUNTER — Telehealth: Payer: Self-pay

## 2014-11-21 ENCOUNTER — Other Ambulatory Visit: Payer: Self-pay | Admitting: Family Medicine

## 2014-11-21 DIAGNOSIS — I2510 Atherosclerotic heart disease of native coronary artery without angina pectoris: Secondary | ICD-10-CM

## 2014-11-21 DIAGNOSIS — I483 Typical atrial flutter: Secondary | ICD-10-CM

## 2014-11-21 DIAGNOSIS — F03918 Unspecified dementia, unspecified severity, with other behavioral disturbance: Secondary | ICD-10-CM

## 2014-11-21 DIAGNOSIS — F0391 Unspecified dementia with behavioral disturbance: Secondary | ICD-10-CM

## 2014-11-21 DIAGNOSIS — I679 Cerebrovascular disease, unspecified: Secondary | ICD-10-CM

## 2014-11-21 DIAGNOSIS — E785 Hyperlipidemia, unspecified: Secondary | ICD-10-CM

## 2014-11-21 MED ORDER — VANCOMYCIN HCL 250 MG PO CAPS: 250.0000 mg | ORAL_CAPSULE | Freq: Four times a day (QID) | ORAL | Status: DC

## 2014-11-21 NOTE — Telephone Encounter (Signed)
Pt is here saying that she still have diarrhea. Pt was in the ED on Saturday

## 2014-11-21 NOTE — Patient Outreach (Signed)
[facility]) Care Management  11/21/2014  [name] [birth year] [id number]   Referral from MD via EPIC, assigned Dannielle Huh, RN to outreach for [place] Management services.  Thanks, [name]. [place], [place] Assistant Phone: [phone] Fax: [phone]

## 2014-11-21 NOTE — Telephone Encounter (Signed)
At this point, she has been on two courses of Metronidazole with reported continued diarrhea. Start Vancomycin 250 mg po QID for 10 days   Disp #40

## 2014-11-21 NOTE — Telephone Encounter (Signed)
Rx was call in and insurance pay for it

## 2014-11-22 ENCOUNTER — Other Ambulatory Visit: Payer: Self-pay | Admitting: *Deleted

## 2014-11-22 ENCOUNTER — Encounter: Payer: Self-pay | Admitting: *Deleted

## 2014-11-22 ENCOUNTER — Telehealth: Payer: Self-pay | Admitting: Family Medicine

## 2014-11-22 NOTE — Telephone Encounter (Signed)
I think we should bring [name] in.  Can use SDA if needed.  If he is having diarrhea will need to be tested for C dif.

## 2014-11-22 NOTE — Telephone Encounter (Signed)
Let me know if I need to do TCM call. Mrs. Mahrt recently diangosed with C.Diff. I am concerned with Mr. Torrico diarrhea if he has traces of it in his system.

## 2014-11-22 NOTE — Telephone Encounter (Signed)
Left message on voice mail that appointment is wed, nov 16 at 10:45 am. Asked pt to call back and confirm.

## 2014-11-22 NOTE — Telephone Encounter (Signed)
Pt went to ED on Sat and advised to follow up with pcp for a re evaluation

## 2014-11-22 NOTE — Patient Outreach (Signed)
[facility]) Care Management  11/22/2014  [name] 02-08-1932 [id number]   Assessment: Care coordination  Referral received from Emergency Room doctor via EPIC through care management assistant [name]) for dementia-- needing assistant in home to see if patient is taking medications correctly.   81 years old female who was seen in the emergency room on 11/19/14 for diarrhea (chronic) and left lower quadrant abdominal pain.  Per primary provider's note, patient was tested positive for C-diff on 11/1 and was prescribed with Flagyl 3 times a day for 10 days.  Patient was seen by primary care provider on 11/7 to follow-up on C-diff and provider's note states that patient did not pick the Flagyl prescription up until this day 11/7.  Patient was seen in the emergency room on 11/12 for diarrhea and abdominal pain and patient was still on Flagyl.   On 11/14, patient still reported having diarrhea and primary care provider ([name]) ordered Vancomycin 250 mg 4 times a day for 10 days. Patient states she has already taken 3 doses of Vanco since yesterday and her "diarrhea has slowed down some".  Transition of care call completed. Patient reports she has "little bit of diarrhea but not as liquid anymore and has a little texture to it".  Patient reports understanding of her medications and her hospital stay.  [name] states she is managing her own medications, has all her medication supplies and she uses a pill box to organize her medicines. Patient has a scheduled follow up visit with [name] on 12/5 but her discharge instruction states to call and schedule a follow-up appointment with primary care provider as soon as possible. She reports that she had been calling but unable to speak with anyone. She expressed understanding to call back [name] office to set an earlier schedule for follow-up.   Transportation is provided by patient's husband and states that her  husband is "still a good and safe driver as contrary to what daughter says". Patient agreed to a home visit next week. Denies any needs or concerns at this time. Encouraged patient to call if needed. [facility], care management coordinator and 24-hour nurse line contact numbers provided.    Plan: Initial home visit 11/28/14.  Mckinzie Saksa A. Carneshia Raker, BSN, RN-BC [place] Management Coordinator Cell: [phone]

## 2014-11-22 NOTE — Telephone Encounter (Signed)
Please schedule appointment to get [name] in. May use SDA.

## 2014-11-22 NOTE — Telephone Encounter (Addendum)
Husband called to make a hospital follow up appointment for [name].  The hospital advised [name] to see PCP for reevaluation. [name] asked to see Dr [name] today, but no appointment to .  ' [name] declined another provider. Mrs Kievit, [name] is still having diarrhea this am.  No hospital follow up at all this week.  Please advise

## 2014-11-22 NOTE — Telephone Encounter (Signed)
Please see message and advise 

## 2014-11-23 ENCOUNTER — Ambulatory Visit: Payer: Medicare Other | Admitting: Family Medicine

## 2014-11-23 ENCOUNTER — Telehealth: Payer: Self-pay | Admitting: Family Medicine

## 2014-11-23 DIAGNOSIS — Z0289 Encounter for other administrative examinations: Secondary | ICD-10-CM

## 2014-11-23 NOTE — Telephone Encounter (Signed)
Pt no showed appt today. Please advise on what needs to be done next?

## 2014-11-23 NOTE — Telephone Encounter (Signed)
Patient Name: [name] DOB: [birth year] Initial Comment Caller states she is having diarrhea. Wants to know if she needs another appointment. Also has questions about her meds. Nurse Assessment Nurse: Marcelline Deist, RN, Lynda Date/Time (Eastern Time): 11/23/2014 9:21:09 AM Confirm and document reason for call. If symptomatic, describe symptoms. ---Caller states she is having diarrhea. Wants to know if she needs another appointment. Also has questions about her meds. Her Dr. prescribed Vancomycin & Flagyl last week, not sure if she is supposed to continue to take it. Did not do a stool culture. The diarrhea is very loose. No abdominal pain now. She is taking Vancomycin 4 times a day, the Flagyl 3 times a day. Has the patient traveled out of the country within the last 30 days? ---No Does the patient have any new or worsening symptoms? ---Yes Will a triage be completed? ---Yes Related visit to physician within the last 2 weeks? ---Yes Does the PT have any chronic conditions? (i.e. diabetes, asthma, etc.) ---Yes List chronic conditions. ---on BP rx, tachycardia Guidelines Guideline Title Affirmed Question Affirmed Notes Diarrhea [1] MODERATE diarrhea (e.g., 4-6 times / day more than normal) AND [2] present > 48 hours (2 days) Final Disposition User See Physician within [place], RN, Kermit Balo Comments Caller is asking how long she needs to continue the antibiotics. She is still having 4-6 episodes a day of loose stool. She does not think she needs another appt. at this time, but would like some feedback from her Dr. Please contact her at this #. Referrals REFERRED TO PCP OFFICE Disagree/Comply: Comply

## 2014-11-24 ENCOUNTER — Encounter: Payer: Self-pay | Admitting: Family Medicine

## 2014-11-25 ENCOUNTER — Ambulatory Visit (INDEPENDENT_AMBULATORY_CARE_PROVIDER_SITE_OTHER): Payer: Medicare Other | Admitting: Family Medicine

## 2014-11-25 ENCOUNTER — Encounter: Payer: Self-pay | Admitting: Family Medicine

## 2014-11-25 VITALS — BP 140/60 | HR 80 | Temp 98.1°F | Resp 14 | Ht 62.0 in | Wt 164.5 lb

## 2014-11-25 DIAGNOSIS — A0472 Enterocolitis due to Clostridium difficile, not specified as recurrent: Secondary | ICD-10-CM

## 2014-11-25 DIAGNOSIS — A047 Enterocolitis due to Clostridium difficile: Secondary | ICD-10-CM | POA: Diagnosis not present

## 2014-11-25 DIAGNOSIS — F0390 Unspecified dementia without behavioral disturbance: Secondary | ICD-10-CM

## 2014-11-25 DIAGNOSIS — I2510 Atherosclerotic heart disease of native coronary artery without angina pectoris: Secondary | ICD-10-CM

## 2014-11-25 NOTE — Progress Notes (Signed)
Pre visit review using our clinic review tool, if applicable. No additional management support is needed unless otherwise documented below in the visit note. 

## 2014-11-25 NOTE — Patient Instructions (Addendum)
Clostridium Difficile Infection Clostridium difficile (C. difficile or C. diff) is a bacterium normally found in the intestinal tract or colon. C. difficile infection causes diarrhea and sometimes a severe disease called pseudomembranous colitis (C. difficile colitis). C. difficile colitis can damage the lining of the colon or cause the colon to become very large (toxic megacolon). Older adults and people with certain medical conditions have a greater risk of getting C. difficile infections. CAUSES The balance of bacteria in your colon can change when you are sick, especially when taking antibiotic medicine. Taking antibiotics may allow the C. difficile to grow, multiply, and make a toxin that causes C. difficile infection.  SYMPTOMS  Diarrhea.  Fever.  Fatigue.  Loss of appetite.  Nausea.  Abdominal swelling, pain, or tenderness.  Dehydration. DIAGNOSIS Your health care provider may suspect C. difficile infection based on your symptoms and if you have taken antibiotics recently. Your health care provider may also order:  A lab test that can detect the toxin in your stool.  A sigmoidoscopy or colonoscopy to look at the appearance of your colon. These procedures involve passing an instrument through your rectum to look at the inside of your colon. Your health care provider will help determine if these tests are necessary. TREATMENT Treatment may include:  Taking antibiotics that keep C. difficile from growing.  Stopping the antibiotics you were on before the C. difficile infection began. Only do this if instructed to do so by your health care provider.  IV fluids and correction of electrolyte imbalance.  Surgery to remove the infected part of the intestines. This is rare. HOME CARE INSTRUCTIONS  Drink enough fluids to keep your urine clear or pale yellow. Avoid milk, caffeine, and alcohol.  Ask your health care provider for specific rehydration instructions.  Eat small,  frequent meals rather than large meals.  Take your antibiotics as directed. Finish them even if you start to feel better.  Do not use medicines to slow diarrhea. This could delay healing or cause problems.  Wash your hands thoroughly after using the bathroom and before preparing food. Make sure people who live with you wash their hands often, too.  Clean all surfaces with a product that contains chlorine bleach. SEEK MEDICAL CARE IF:  Your diarrhea lasts longer than expected or comes back after you finish your antibiotic medicine for the C. difficile infection.  You have trouble staying hydrated.  You have a fever. SEEK IMMEDIATE MEDICAL CARE IF:  You have increasing abdominal pain or tenderness.  You have blood in your stools, or your stools look dark black and tarry.  You cannot eat or drink without vomiting.   This information is not intended to replace advice given to you by your health care provider. Make sure you discuss any questions you have with your health care provider.   Document Released: 10/03/2004 Document Revised: 01/14/2014 Document Reviewed: 06/27/2014 Elsevier Interactive Patient Education 2016 Elsevier Inc.  

## 2014-11-25 NOTE — Progress Notes (Signed)
Subjective:    Patient ID: [name], female    DOB: 1932-12-16, 81 y.o.   MRN: [id number]  HPI Patient seen for follow-up C. difficile colitis. We initially place her on Flagyl but after taking a second go round she has not seen resolution of her diarrhea. We then switched her to vancomycin. Unfortunately, both she and her husband have some cognitive impairment. She does appear to be taking the vancomycin and brings in a prescription today but she has her medication in a tub or container and has apparently been taking this 2 or 3 times a day but not 4 times a day as instructed. Her stools are somewhat better-she states about 4 per day and loose. No definite fever. No nausea or vomiting. Mild diffuse abdominal cramping  Past Medical History  Diagnosis Date  . OSA (obstructive sleep apnea)   . History of bronchitis   . Other diseases of lung, not elsewhere classified   . Hypertension   . CAD (coronary artery disease)     LAD 30% 2006  . History of atrial flutter   . Cerebrovascular disease   . Hyperlipemia   . Obesity   . GERD (gastroesophageal reflux disease)   . Diverticulosis of colon   . IBS (irritable bowel syndrome)   . Colonic polyp   . Crohn's disease ([place])   . Hypothyroidism   . History of hyperparathyroidism   . Fibromyalgia   . Osteopenia   . History of headache   . Syncope   . Restless legs syndrome (RLS)   . Psychosis   . History of tuberculosis   . Radiation 05/2008    5000 cGy to left lower eyelid, parotid lymphatics and upper neck  . Memory disturbance   . Bipolar affective ([place])     History of psychosis  . Merkel cell tumor ([place])     Left eyelid   Past Surgical History  Procedure Laterality Date  . Parathyroid adenoma surgery    . Cholecystectomy    . Moh';s surg left lower eye lid surgery and lid reconstruction surgery  2/10  . Bladder suspension    . Cataract extraction Bilateral   . Appendectomy    . Tonsillectomy and adenoidectomy    . Lumbar  laminectomy    . Incision and drainage perirectal abscess    . Heel spur excision    . Nasal sinus surgery Right     Right maxillary  . Eye lid surgery  04/04/2011    [facility] by Dr. Norlene Duel  . Posterior cervical fusion/foraminotomy N/A 10/12/2013    Procedure: Cervical five-Thoracic two cervico-thoracic posterior fusion;  Surgeon: [name], MD;  Location: [facility];  Service: Neurosurgery;  Laterality: N/A;    reports that she quit smoking about 54 years ago. Her smoking use included Cigarettes. She quit after 6 years of use. She has never used smokeless tobacco. She reports that she does not drink alcohol or use illicit drugs. family history includes Arthritis in her mother; Colon cancer in an other family member; Heart failure in her father; Hypertension in her mother; Pulmonary embolism in an other family member; Ulcers in her mother. Allergies  Allergen Reactions  . Prednisone Other (See Comments)    Patient had psychiatric episode with hallucinations. Caused 3.5 week long hospitalization & loss of memory   . Carbamazepine Nausea Only and Other (See Comments)    Tegretol; dizziness, blurred vision, nausea, headache, insomnia  . Celebrex [Celecoxib] Other (See Comments)  No relief from pain; ineffective  . Codeine Nausea And Vomiting  . Demerol [Meperidine] Nausea And Vomiting  . Diclofenac Sodium Other (See Comments)    severe headache  . Fentanyl Nausea And Vomiting and Other (See Comments)    IV Fentanyl; caused nausea and vomiting Fentanyl patch; caused chest pain, HBP, with second patch experienced vertigo and nausea  . Fluoxetine Hcl Nausea Only and Other (See Comments)    dizziness, blurred vision, nausea, headache, insomnia  . Hydromorphone Hcl Nausea And Vomiting  . Ibuprofen Other (See Comments)    Caused ulcers and colitis  . Indomethacin Nausea Only and Other (See Comments)    dizziness, blurred vision, nausea, headache, insomnia  . Lisinopril Cough  .  Meperidine Hcl Nausea And Vomiting  . Morphine Nausea And Vomiting  . Oruvail [Ketoprofen] Other (See Comments)    No relief from pain; ineffective  . Pregabalin Other (See Comments)    severe restless legs, insomnia  . Propranolol Hcl Nausea Only and Other (See Comments)    dizziness, blurred vision, nausea, headache, insomnia  . Refresh Lacri-Lube [Artificial Tears] Swelling    Caused swelling, redness, hard crust on eyelids   . Relafen [Nabumetone] Other (See Comments)    No relief from pain; ineffective  . Ropinirole Hcl Other (See Comments)    severe restless legs and insomnia  . Sulfamethoxazole Nausea Only and Other (See Comments)    gas, loose stools  . Bacitracin Hives, Itching and Rash    redness      Review of Systems  Constitutional: Negative for fever and chills.  Respiratory: Negative for shortness of breath.   Cardiovascular: Negative for chest pain.  Gastrointestinal: Positive for diarrhea. Negative for nausea, vomiting, blood in stool and abdominal distention.  Neurological: Negative for dizziness and weakness.       Objective:   Physical Exam  Constitutional: She appears well-developed and well-nourished.  HENT:  Mouth/Throat: Oropharynx is clear and moist.  Cardiovascular: Normal rate and regular rhythm.   Pulmonary/Chest: Effort normal and breath sounds normal. No respiratory distress. She has no wheezes. She has no rales.  Abdominal: Soft. Bowel sounds are normal. She exhibits no distension and no mass. There is no tenderness. There is no rebound and no guarding.          Assessment & Plan:  #1 C. difficile colitis. She does not appear toxic in any way and does not appear dehydrated. She is currently on vancomycin but appears to not be taking this appropriately. We're explicit in going over instructions to take 4 times daily. She is slightly improved. Reassess next week #2 cognitive impairment. I have discussed with both patient and her husband (who  also has cognitive impairment) that we need to look at assistance socially. I have concerns about either one of them managing their own medications. We will look at [facility] care management consult.

## 2014-11-26 DIAGNOSIS — A0472 Enterocolitis due to Clostridium difficile, not specified as recurrent: Secondary | ICD-10-CM | POA: Insufficient documentation

## 2014-11-26 HISTORY — DX: Enterocolitis due to Clostridium difficile, not specified as recurrent: A04.72

## 2014-11-28 ENCOUNTER — Encounter: Payer: Self-pay | Admitting: *Deleted

## 2014-11-28 ENCOUNTER — Other Ambulatory Visit: Payer: Self-pay | Admitting: *Deleted

## 2014-11-28 VITALS — BP 128/60 | HR 68 | Resp 20

## 2014-11-28 DIAGNOSIS — F0391 Unspecified dementia with behavioral disturbance: Secondary | ICD-10-CM

## 2014-11-28 DIAGNOSIS — F03918 Unspecified dementia, unspecified severity, with other behavioral disturbance: Secondary | ICD-10-CM

## 2014-11-28 NOTE — Patient Outreach (Signed)
[facility]) Care Management   11/28/2014  LEATHIA FARNELL Mar 15, 1932 [id number]  RAMANDA PAULES is an 81 y.o. female  Subjective: Patient reports feeling "much better". She states she's still having about 2-3 bouts of soft (semi-formed) stools a day and occasional abdominal cramping but no fever, nausea or vomiting.   Objective:  BP 128/60 mmHg  Pulse 68  Resp 20  SpO2 96%  Review of Systems  Constitutional: Negative.   HENT: Negative.   Eyes: Negative.        History of surgery to left eye (eyelid) Thera tears use for dryness  Respiratory: Negative.  Negative for cough, sputum production, shortness of breath and wheezing.        Lung sounds clear to auscultation  Cardiovascular: Negative.  Negative for leg swelling.       Regular, rate and rhythm  Gastrointestinal: Positive for abdominal pain. Negative for heartburn, nausea and vomiting.       Soft stool about 2-3 times a day Occasional abdominal cramping  Abdomen soft, bowel sounds present  Genitourinary: Negative.   Musculoskeletal: Positive for falls. Negative for back pain and joint pain.       History of fall  Skin: Negative.   Neurological: Negative.   Endo/Heme/Allergies: Negative.   Psychiatric/Behavioral: Positive for memory loss.       History of senile dementia    Physical Exam  Current Medications:   Current Outpatient Prescriptions  Medication Sig Dispense Refill  . amLODipine (NORVASC) 2.5 MG tablet Take 1 tablet (2.5 mg total) by mouth daily. 30 tablet 6  . aspirin EC 81 MG tablet Take 81 mg by mouth every evening.    Marland Kitchen atorvastatin (LIPITOR) 40 MG tablet TAKE 1 TABLET DAILY 90 tablet 2  . Biotin 5000 MCG TABS Take 5,000 mcg by mouth every morning.     . Calcium-Magnesium 500-250 MG TABS Take 2 tablets by mouth daily at 6 PM.     . carvedilol (COREG) 25 MG tablet Take 1 tablet (25 mg total) by mouth 2 (two) times daily with a meal. PATIENT NEEDS TO CONTACT OFFICE FOR ADDITIONAL REFILLS 30  tablet 0  . Cholecalciferol (VITAMIN D3) 2000 UNITS TABS Take 2,000 Units by mouth daily at 12 noon.     . cholestyramine (QUESTRAN) 4 GM/DOSE powder Take 1 packet (4 g total) by mouth daily with breakfast. 2 scoops in water at 7 am 378 g 1  . co-enzyme Q-10 50 MG capsule Take 50 mg by mouth daily at 6 PM.     . donepezil (ARICEPT) 5 MG tablet Take 2 tablets (10 mg total) by mouth daily. Take 2 tablets (10 mg) at 7 am 60 tablet 5  . Evening Primrose Oil 500 MG CAPS Take 500 mg by mouth 2 (two) times daily.     . fluticasone (FLONASE) 50 MCG/ACT nasal spray Place 2 sprays into both nostrils at bedtime.    Marland Kitchen guaifenesin (HUMIBID E) 400 MG TABS Take 400 mg by mouth 2 (two) times daily.     Marland Kitchen ketotifen (THERA TEARS ALLERGY) 0.025 % ophthalmic solution Place 1 drop into both eyes daily as needed (dry eyes).    Marland Kitchen levothyroxine (SYNTHROID, LEVOTHROID) 88 MCG tablet Take 1 tablet (88 mcg total) by mouth daily before breakfast. 90 tablet 0  . lidocaine (LIDODERM) 5 % Place 1 patch onto the skin every 12 (twelve) hours as needed (back pain). Remove & Discard patch within 12 hours or as directed by MD    .  mesalamine (LIALDA) 1.2 G EC tablet Take 3.6 g by mouth daily with lunch.     . Multiple Vitamins-Minerals (SUPER VITA-MINS PO) Take 1 tablet by mouth daily at 12 noon.     . Omega 3 1000 MG CAPS Take 1,000 mg by mouth 2 (two) times daily.    . pramipexole (MIRAPEX) 0.5 MG tablet TAKE 1 TABLET TWICE A DAY (NEED OFFICE VISIT) 180 tablet 0  . PROBIOTIC CAPS Take 1 capsule by mouth daily.    . Simethicone (PHAZYME PO) Take 1-2 tablets by mouth 2 (two) times daily. Take 1 tablet at noon and 2 tablets at 4pm    . traMADol (ULTRAM) 50 MG tablet Take 1 tablet (50 mg total) by mouth every 6 (six) hours as needed. (Patient taking differently: Take 50 mg by mouth every 6 (six) hours as needed for moderate pain. ) 90 tablet 2  . vancomycin (VANCOCIN) 250 MG capsule Take 1 capsule (250 mg total) by mouth 4 (four) times  daily. 40 capsule 0  . vitamin C (ASCORBIC ACID) 500 MG tablet Take 500 mg by mouth daily at 12 noon.     . metroNIDAZOLE (FLAGYL) 500 MG tablet Take 1 tablet (500 mg total) by mouth 3 (three) times daily. (Patient not taking: Reported on 11/28/2014) 21 tablet 0   No current facility-administered medications for this visit.    Functional Status:   In your present state of health, do you have any difficulty performing the following activities: 11/22/2014  Hearing? N  Vision? N  Difficulty concentrating or making decisions? Y  Walking or climbing stairs? N  Dressing or bathing? N  Doing errands, shopping? N  Preparing Food and eating ? N  Using the Toilet? N  In the past six months, have you accidently leaked urine? N  Do you have problems with loss of bowel control? N  Managing your Medications? N  Managing your Finances? Y  Housekeeping or managing your Housekeeping? Y    Fall/Depression Screening:    PHQ 2/9 Scores 11/22/2014 06/24/2014 06/10/2012  PHQ - 2 Score 0 0 0    Assessment:   81 year old female,who lives with husband [name] at [place]. Arrived at patient's unit. Husband and patient lives in a 1 bedroom unit on the 3rd level. Patient and husband are very pleasant and both are present during the visit. Patient shares that [place] provide their meals 3 times a day, provides housekeeping services and changing of their linens and towels weekly.  Ms. Galyon reports still having abdominal cramping occasionally but it is "much better". Patient states that diarrhea is gradually improving and she mentions that she still have 2-3 bouts of stools (semi-formed) a day but not as watery, per patient report. Patient shares that she was able to set an earlier appointment to see her primary care provider. Follow-up appointment was done on 11/18.  [name] provides transportation to patient's appointments. Patient walks with a walker. She  uses regular walker in the building and has rollator walker that she uses when goes outside.  According to patient, she completed her antibiotics Flagyl and continues with Vancomycin as ordered. Medications reviewed with patient. Instructed her to complete the course of her antibiotics as instructed. Reminded patient to wash her hands thoroughly after using the bathroom and before preparing food. Advised patient to cleanse surfaces with a product containing chlorine bleach. Referral to [place] made to assist in reviewing and organizing patient's medications since she  gets confused with what and when to take them. EMMI educational materials on dementia and diarrhea provided as well as C-difficile print out and encouraged patient to read and ask questions if any.  She denies of any other needs or concerns at this time. Patient agreed to a routine home visit next month. Encouraged patient to call [facility], care management coordinator and 24 hour nurse-line as needed.    Plan:  Routine home visit 12/21. Will refer to [place] to assist with reviewing and organizing her medications.   THN CM Care Plan Problem One        Most Recent Value   Care Plan Problem One  impaired cognitive ability related to dementia resulting to inability to take/ complete treatment for C-Difficile diarrhea   Role Documenting the Problem One  Care Management [place] for Problem One  Active   THN Long Term Goal (31-90 days)  Patient will be free from diarrhea in the next 31 days   THN Long Term Goal Start Date  11/28/14   Interventions for Problem One Long Term Goal  review medications with patient,  [facility] pharmacy referral to assist with review and organization of medications,  encourage medication adherence and emphasize completion of antibioitics as directed even if she starts feeling better,  advise to eat frequent meals rather than large meals,  remind patient not to use medication that will slow  diarrhea since it could delay the healing   THN CM Short Term Goal #1 (0-30 days)  patient will complete her antibiotics regimen within the next 2 weeks   THN CM Short Term Goal #1 Start Date  11/22/14   Interventions for Short Term Goal #1  review medications,  check that patient has her medication supplies (especially antibiotics),  encourage medication adherence particularly the antibiotics, reiterate completion of antibiotic regimen,  explain the importance and benefits of finishing course of antibiotics as ordered    THN CM Short Term Goal #2 (0-30 days)  patient will reschedule an earlier appointment and will attend follow-up appointment with primary care provider within the next 2 weeks   THN CM Short Term Goal #2 Start Date  11/22/14   [facility] CM Short Term Goal #2 Met Date  11/28/14 [facility] met-  had follow-up with Dr. [name] last 11/18]      Berneda Rose. Yaasir Menken, BSN, RN-BC [place] Management Coordinator Cell: [phone]

## 2014-11-28 NOTE — Telephone Encounter (Signed)
Spoke with [place]. Plans to send her letter to make aware of missing appointments.

## 2014-11-29 ENCOUNTER — Ambulatory Visit: Payer: Medicare Other | Admitting: Family Medicine

## 2014-11-30 ENCOUNTER — Ambulatory Visit: Payer: Medicare Other | Admitting: Family Medicine

## 2014-12-02 ENCOUNTER — Other Ambulatory Visit: Payer: Self-pay | Admitting: Family Medicine

## 2014-12-05 ENCOUNTER — Ambulatory Visit (INDEPENDENT_AMBULATORY_CARE_PROVIDER_SITE_OTHER): Payer: Medicare Other | Admitting: Family Medicine

## 2014-12-05 ENCOUNTER — Encounter: Payer: Self-pay | Admitting: Family Medicine

## 2014-12-05 VITALS — BP 110/80 | HR 84 | Temp 98.5°F | Resp 14 | Ht 62.0 in | Wt 159.0 lb

## 2014-12-05 DIAGNOSIS — F0390 Unspecified dementia without behavioral disturbance: Secondary | ICD-10-CM

## 2014-12-05 DIAGNOSIS — A047 Enterocolitis due to Clostridium difficile: Secondary | ICD-10-CM | POA: Diagnosis not present

## 2014-12-05 DIAGNOSIS — I2510 Atherosclerotic heart disease of native coronary artery without angina pectoris: Secondary | ICD-10-CM

## 2014-12-05 DIAGNOSIS — A0472 Enterocolitis due to Clostridium difficile, not specified as recurrent: Secondary | ICD-10-CM

## 2014-12-05 NOTE — Patient Instructions (Signed)
Clostridium Difficile Infection Clostridium difficile (C. difficile or C. diff) is a bacterium normally found in the intestinal tract or colon. C. difficile infection causes diarrhea and sometimes a severe disease called pseudomembranous colitis (C. difficile colitis). C. difficile colitis can damage the lining of the colon or cause the colon to become very large (toxic megacolon). Older adults and people with certain medical conditions have a greater risk of getting C. difficile infections. CAUSES The balance of bacteria in your colon can change when you are sick, especially when taking antibiotic medicine. Taking antibiotics may allow the C. difficile to grow, multiply, and make a toxin that causes C. difficile infection.  SYMPTOMS  Diarrhea.  Fever.  Fatigue.  Loss of appetite.  Nausea.  Abdominal swelling, pain, or tenderness.  Dehydration. DIAGNOSIS Your health care provider may suspect C. difficile infection based on your symptoms and if you have taken antibiotics recently. Your health care provider may also order:  A lab test that can detect the toxin in your stool.  A sigmoidoscopy or colonoscopy to look at the appearance of your colon. These procedures involve passing an instrument through your rectum to look at the inside of your colon. Your health care provider will help determine if these tests are necessary. TREATMENT Treatment may include:  Taking antibiotics that keep C. difficile from growing.  Stopping the antibiotics you were on before the C. difficile infection began. Only do this if instructed to do so by your health care provider.  IV fluids and correction of electrolyte imbalance.  Surgery to remove the infected part of the intestines. This is rare. HOME CARE INSTRUCTIONS  Drink enough fluids to keep your urine clear or pale yellow. Avoid milk, caffeine, and alcohol.  Ask your health care provider for specific rehydration instructions.  Eat small,  frequent meals rather than large meals.  Take your antibiotics as directed. Finish them even if you start to feel better.  Do not use medicines to slow diarrhea. This could delay healing or cause problems.  Wash your hands thoroughly after using the bathroom and before preparing food. Make sure people who live with you wash their hands often, too.  Clean all surfaces with a product that contains chlorine bleach. SEEK MEDICAL CARE IF:  Your diarrhea lasts longer than expected or comes back after you finish your antibiotic medicine for the C. difficile infection.  You have trouble staying hydrated.  You have a fever. SEEK IMMEDIATE MEDICAL CARE IF:  You have increasing abdominal pain or tenderness.  You have blood in your stools, or your stools look dark black and tarry.  You cannot eat or drink without vomiting.   This information is not intended to replace advice given to you by your health care provider. Make sure you discuss any questions you have with your health care provider.   Document Released: 10/03/2004 Document Revised: 01/14/2014 Document Reviewed: 06/27/2014 Elsevier Interactive Patient Education 2016 Elsevier Inc.  

## 2014-12-05 NOTE — Progress Notes (Signed)
Subjective:    Patient ID: [name], female    DOB: 27-Sep-1932, 81 y.o.   MRN: [id number]  HPI Challenging situation. Both patient and her husband have dementia. They have a daughter who has been in charge of helping that they have recently shunned her care. She has C. difficile coli rhinitis and we've had some major questions regarding compliance. She went through to go rounds of metronidazole without resolution of symptoms in all 14th of November we prescribed vancomycin to take 4 times a day. She brings a note container today with 22 tablets still left and she should've finished this on the 24th. She states that over the past few days she has been compliant taking this 4 times daily but this is questionable. She does state her diarrhea has improved though not resolved. She has 3 stools per day and these are slightly formed but mostly still very loose. No bloody stools. No fever. No abdominal pain. Appetite is fair  Past Medical History  Diagnosis Date  . OSA (obstructive sleep apnea)   . History of bronchitis   . Other diseases of lung, not elsewhere classified   . Hypertension   . CAD (coronary artery disease)     LAD 30% 2006  . History of atrial flutter   . Cerebrovascular disease   . Hyperlipemia   . Obesity   . GERD (gastroesophageal reflux disease)   . Diverticulosis of colon   . IBS (irritable bowel syndrome)   . Colonic polyp   . Crohn's disease ([place])   . Hypothyroidism   . History of hyperparathyroidism   . Fibromyalgia   . Osteopenia   . History of headache   . Syncope   . Restless legs syndrome (RLS)   . Psychosis   . History of tuberculosis   . Radiation 05/2008    5000 cGy to left lower eyelid, parotid lymphatics and upper neck  . Memory disturbance   . Bipolar affective ([place])     History of psychosis  . Merkel cell tumor ([place])     Left eyelid   Past Surgical History  Procedure Laterality Date  . Parathyroid adenoma surgery    . Cholecystectomy    .  Moh';s surg left lower eye lid surgery and lid reconstruction surgery  2/10  . Bladder suspension    . Cataract extraction Bilateral   . Appendectomy    . Tonsillectomy and adenoidectomy    . Lumbar laminectomy    . Incision and drainage perirectal abscess    . Heel spur excision    . Nasal sinus surgery Right     Right maxillary  . Eye lid surgery  04/04/2011    [facility] by Dr. Norlene Duel  . Posterior cervical fusion/foraminotomy N/A 10/12/2013    Procedure: Cervical five-Thoracic two cervico-thoracic posterior fusion;  Surgeon: [name], MD;  Location: [place] [facility];  Service: Neurosurgery;  Laterality: N/A;    reports that she quit smoking about 54 years ago. Her smoking use included Cigarettes. She quit after 6 years of use. She has never used smokeless tobacco. She reports that she does not drink alcohol or use illicit drugs. family history includes Arthritis in her mother; Colon cancer in an other family member; Heart failure in her father; Hypertension in her mother; Pulmonary embolism in an other family member; Ulcers in her mother. Allergies  Allergen Reactions  . Prednisone Other (See Comments)    Patient had psychiatric episode with hallucinations. Caused 3.5 week long  hospitalization & loss of memory   . Carbamazepine Nausea Only and Other (See Comments)    Tegretol; dizziness, blurred vision, nausea, headache, insomnia  . Celebrex [Celecoxib] Other (See Comments)    No relief from pain; ineffective  . Codeine Nausea And Vomiting  . Demerol [Meperidine] Nausea And Vomiting  . Diclofenac Sodium Other (See Comments)    severe headache  . Fentanyl Nausea And Vomiting and Other (See Comments)    IV Fentanyl; caused nausea and vomiting Fentanyl patch; caused chest pain, HBP, with second patch experienced vertigo and nausea  . Fluoxetine Hcl Nausea Only and Other (See Comments)    dizziness, blurred vision, nausea, headache, insomnia  . Hydromorphone Hcl Nausea And Vomiting  .  Ibuprofen Other (See Comments)    Caused ulcers and colitis  . Indomethacin Nausea Only and Other (See Comments)    dizziness, blurred vision, nausea, headache, insomnia  . Lisinopril Cough  . Meperidine Hcl Nausea And Vomiting  . Morphine Nausea And Vomiting  . Oruvail [Ketoprofen] Other (See Comments)    No relief from pain; ineffective  . Pregabalin Other (See Comments)    severe restless legs, insomnia  . Propranolol Hcl Nausea Only and Other (See Comments)    dizziness, blurred vision, nausea, headache, insomnia  . Refresh Lacri-Lube [Artificial Tears] Swelling    Caused swelling, redness, hard crust on eyelids   . Relafen [Nabumetone] Other (See Comments)    No relief from pain; ineffective  . Ropinirole Hcl Other (See Comments)    severe restless legs and insomnia  . Sulfamethoxazole Nausea Only and Other (See Comments)    gas, loose stools  . Bacitracin Hives, Itching and Rash    redness      Review of Systems  Constitutional: Negative for fever and chills.  Gastrointestinal: Positive for diarrhea. Negative for nausea, vomiting and abdominal pain.       Objective:   Physical Exam  Constitutional: She appears well-developed and well-nourished.  Cardiovascular: Normal rate and regular rhythm.   Pulmonary/Chest: Effort normal and breath sounds normal. No respiratory distress. She has no wheezes. She has no rales.  Abdominal: Soft. She exhibits no distension and no mass. There is no tenderness. There is no rebound and no guarding.          Assessment & Plan:  #1 C. difficile colitis. Difficult to assess whether she is improving (hx difficult with her dementia) but seems to be stable. History not reliable but she states symptoms are slowly improving. She's had very poor compliance with medication and is supposed to be finishing up vancomycin course at this time after two rounds of metronidazole and not improving. She has clearly not taken the Vancomycin as prescribed  as she still had #22 capsules left today. #2 social. We recently had consult with [place] care to help ensure compliance. Short of having them in higher level of care not sure we can do much else to enforce compliance

## 2014-12-05 NOTE — Progress Notes (Signed)
Pre visit review using our clinic review tool, if applicable. No additional management support is needed unless otherwise documented below in the visit note. 

## 2014-12-08 ENCOUNTER — Telehealth: Payer: Self-pay | Admitting: Family Medicine

## 2014-12-08 NOTE — Telephone Encounter (Signed)
[name]'s wife, came in and stated their new pharmacy is going to be [facility] on [address].  He wasn't sure what he needed to do about getting all her meds transferred over.

## 2014-12-09 NOTE — Telephone Encounter (Signed)
Spoke with husband and he will call back with medications needed, or have [facility] fax a request

## 2014-12-12 ENCOUNTER — Encounter: Payer: Self-pay | Admitting: Family Medicine

## 2014-12-12 ENCOUNTER — Ambulatory Visit (INDEPENDENT_AMBULATORY_CARE_PROVIDER_SITE_OTHER): Payer: Medicare Other | Admitting: Family Medicine

## 2014-12-12 VITALS — BP 140/80 | HR 69 | Temp 97.5°F | Resp 16 | Ht 62.0 in | Wt 161.2 lb

## 2014-12-12 DIAGNOSIS — A047 Enterocolitis due to Clostridium difficile: Secondary | ICD-10-CM | POA: Diagnosis not present

## 2014-12-12 DIAGNOSIS — I2510 Atherosclerotic heart disease of native coronary artery without angina pectoris: Secondary | ICD-10-CM | POA: Diagnosis not present

## 2014-12-12 DIAGNOSIS — A0472 Enterocolitis due to Clostridium difficile, not specified as recurrent: Secondary | ICD-10-CM

## 2014-12-12 NOTE — Patient Instructions (Signed)
Follow up promptly for any fever, recurrent watery stools, or severe abdominal cramps.

## 2014-12-12 NOTE — Progress Notes (Signed)
Subjective:    Patient ID: [name], female    DOB: 01/22/1932, 81 y.o.   MRN: [id number]  HPI Follow-up C. difficile colitis. As previously noted, history is challenging with her dementia and her husband also has dementia. She apparently has finished out vancomycin which we placed her own after she had taken 2 courses of metronidazole without improvement. She states that she has only occasional loose stool this point but no frequent stools. No abdominal pain. No bloody stools. No fevers or chills. Drinking fluids well. Weight up 2 pounds from last visit.  Past Medical History  Diagnosis Date  . OSA (obstructive sleep apnea)   . History of bronchitis   . Other diseases of lung, not elsewhere classified   . Hypertension   . CAD (coronary artery disease)     LAD 30% 2006  . History of atrial flutter   . Cerebrovascular disease   . Hyperlipemia   . Obesity   . GERD (gastroesophageal reflux disease)   . Diverticulosis of colon   . IBS (irritable bowel syndrome)   . Colonic polyp   . Crohn's disease ([place])   . Hypothyroidism   . History of hyperparathyroidism   . Fibromyalgia   . Osteopenia   . History of headache   . Syncope   . Restless legs syndrome (RLS)   . Psychosis   . History of tuberculosis   . Radiation 05/2008    5000 cGy to left lower eyelid, parotid lymphatics and upper neck  . Memory disturbance   . Bipolar affective ([place])     History of psychosis  . Merkel cell tumor ([place])     Left eyelid   Past Surgical History  Procedure Laterality Date  . Parathyroid adenoma surgery    . Cholecystectomy    . Moh';s surg left lower eye lid surgery and lid reconstruction surgery  2/10  . Bladder suspension    . Cataract extraction Bilateral   . Appendectomy    . Tonsillectomy and adenoidectomy    . Lumbar laminectomy    . Incision and drainage perirectal abscess    . Heel spur excision    . Nasal sinus surgery Right     Right maxillary  . Eye lid surgery   04/04/2011    [facility] by Dr. Norlene Duel  . Posterior cervical fusion/foraminotomy N/A 10/12/2013    Procedure: Cervical five-Thoracic two cervico-thoracic posterior fusion;  Surgeon: [name], MD;  Location: [place] [facility];  Service: Neurosurgery;  Laterality: N/A;    reports that she quit smoking about 54 years ago. Her smoking use included Cigarettes. She quit after 6 years of use. She has never used smokeless tobacco. She reports that she does not drink alcohol or use illicit drugs. family history includes Arthritis in her mother; Colon cancer in an other family member; Heart failure in her father; Hypertension in her mother; Pulmonary embolism in an other family member; Ulcers in her mother. Allergies  Allergen Reactions  . Prednisone Other (See Comments)    Patient had psychiatric episode with hallucinations. Caused 3.5 week long hospitalization & loss of memory   . Carbamazepine Nausea Only and Other (See Comments)    Tegretol; dizziness, blurred vision, nausea, headache, insomnia  . Celebrex [Celecoxib] Other (See Comments)    No relief from pain; ineffective  . Codeine Nausea And Vomiting  . Demerol [Meperidine] Nausea And Vomiting  . Diclofenac Sodium Other (See Comments)    severe headache  . Fentanyl Nausea  And Vomiting and Other (See Comments)    IV Fentanyl; caused nausea and vomiting Fentanyl patch; caused chest pain, HBP, with second patch experienced vertigo and nausea  . Fluoxetine Hcl Nausea Only and Other (See Comments)    dizziness, blurred vision, nausea, headache, insomnia  . Hydromorphone Hcl Nausea And Vomiting  . Ibuprofen Other (See Comments)    Caused ulcers and colitis  . Indomethacin Nausea Only and Other (See Comments)    dizziness, blurred vision, nausea, headache, insomnia  . Lisinopril Cough  . Meperidine Hcl Nausea And Vomiting  . Morphine Nausea And Vomiting  . Oruvail [Ketoprofen] Other (See Comments)    No relief from pain; ineffective  . Pregabalin  Other (See Comments)    severe restless legs, insomnia  . Propranolol Hcl Nausea Only and Other (See Comments)    dizziness, blurred vision, nausea, headache, insomnia  . Refresh Lacri-Lube [Artificial Tears] Swelling    Caused swelling, redness, hard crust on eyelids   . Relafen [Nabumetone] Other (See Comments)    No relief from pain; ineffective  . Ropinirole Hcl Other (See Comments)    severe restless legs and insomnia  . Sulfamethoxazole Nausea Only and Other (See Comments)    gas, loose stools  . Bacitracin Hives, Itching and Rash    redness      Review of Systems  Constitutional: Negative for fever and chills.  Gastrointestinal: Negative for abdominal pain and blood in stool.       Objective:   Physical Exam  Constitutional: She appears well-developed and well-nourished.  HENT:  Mouth/Throat: Oropharynx is clear and moist.  Cardiovascular: Normal rate and regular rhythm.   Pulmonary/Chest: Breath sounds normal. No respiratory distress. She has no wheezes. She has no rales.  Abdominal: Soft. There is no tenderness.          Assessment & Plan:  Recent C. difficile colitis. Treatment has been challenging with her dementia. We had [facility] care team go out to try to help enforce medications. She apparently is improved at this point. Follow-up promptly for any recurrent diarrhea, fever, abdominal pain.

## 2014-12-16 ENCOUNTER — Other Ambulatory Visit: Payer: Self-pay | Admitting: Pharmacist

## 2014-12-16 NOTE — Patient Outreach (Signed)
[facility]) Care Management  [facility]   12/16/2014  SHANQUETTA ORMOND [birth year] [id number]  Subjective: MIDNA KORF is a 81 y.o. female who was referred to [facility] [facility] for medication management.  I spoke with the patient today, explaining [facility] services and she refused [place] services, saying that she has been able to manage her medications by herself for a long time. Asked patient to please call us if she every changes her mind. Patient verbalized understanding.   Nicoletta Ba, PharmD, [facility] [phone]

## 2014-12-27 ENCOUNTER — Telehealth: Payer: Self-pay | Admitting: Family Medicine

## 2014-12-27 NOTE — Telephone Encounter (Signed)
[facility] Patient Name: [name] DOB: [birth year] Initial Comment Caller states she is having bowel pains - cramping and loose stools. Nurse Assessment Nurse: Malva Cogan, RN, Juliann Pulse Date/Time Eilene Ghazi Time): 12/27/2014 4:54:56 PM Confirm and document reason for call. If symptomatic, describe symptoms. ---Caller states that she finished Vancomycin for C. diff but is still having lower abd cramping "for past few days" & loose stools ("but not diarrhea"). Caller does not know when she completed Vancomycin. Has the patient traveled out of the country within the last 30 days? ---No Does the patient have any new or worsening symptoms? ---Yes Will a triage be completed? ---Yes Related visit to physician within the last 2 weeks? ---No Does the PT have any chronic conditions? (i.e. diabetes, asthma, etc.) ---Yes List chronic conditions. ---HTN, high cholesterol, acid reflux, hypothyroidism, H/O TB, arthritis Is this a behavioral health or substance abuse call? ---No Guidelines Guideline Title Affirmed Question Affirmed Notes Abdominal Pain - Female [1] MILD (e.g., does not interfere with normal activities) AND [2] pain comes and goes (cramps) AND [3] present > 48 hours Final Disposition User See Physician within [place], RN, Juliann Pulse Comments 4-5 on pain scale intermittent does not interfere with nml activities, has loose stools as well Was attempting to make an appt for caller for tomorrow but schedule states that pt already has an appt scheduled for tomorrow at 1100 with Dr. Alysia Penna. Referrals REFERRED TO PCP OFFICE Disagree/Comply: Comply

## 2014-12-28 ENCOUNTER — Other Ambulatory Visit: Payer: Self-pay | Admitting: *Deleted

## 2014-12-28 ENCOUNTER — Other Ambulatory Visit: Payer: Self-pay | Admitting: Family Medicine

## 2014-12-28 ENCOUNTER — Ambulatory Visit: Payer: Medicare Other | Admitting: Family Medicine

## 2014-12-28 ENCOUNTER — Encounter: Payer: Self-pay | Admitting: *Deleted

## 2014-12-28 DIAGNOSIS — R197 Diarrhea, unspecified: Secondary | ICD-10-CM

## 2014-12-28 DIAGNOSIS — R109 Unspecified abdominal pain: Secondary | ICD-10-CM

## 2014-12-28 NOTE — Telephone Encounter (Signed)
i have discussed with [name].  I think she needs GI evaluation at this time.  Took 2 courses of Flagyl and one of Vancomycin- though compliance in question secondary to cognitive deficits.

## 2014-12-28 NOTE — Telephone Encounter (Signed)
Referral placed with GI. Patient already on their appointment list to be seen this Friday, December 23rd with GI. Spoke with patient and she is aware and is also aware to contact our office if other symptoms arise. Appointment with Dr. Sarajane Jews canceled as she will seeing GI in two days.

## 2014-12-28 NOTE — Telephone Encounter (Signed)
This should be handled by Dr. [name]

## 2014-12-28 NOTE — Patient Outreach (Signed)
[facility]) Care Management   12/28/2014  [name] 04-Jul-1932 [id number]  [name] is an 81 y.o. female  Subjective: Patient states feeling "good". She reports still having "loose stools with some substance"- small amount of yellowish-brown stools about 2- 3 times a day as stated.  Objective:  BP 124/60 mmHg  Pulse 75  Resp 16  SpO2 98%  Review of Systems  Constitutional: Negative.   HENT: Negative.   Eyes:       History of left eye cancer and surgery to left eye lid  Respiratory: Negative for cough, sputum production and wheezing.        Lung sounds clear to auscultation  Cardiovascular: Negative for leg swelling.       Regular rate and rhythm  Gastrointestinal: Positive for abdominal pain and diarrhea. Negative for nausea and vomiting.       Reports loose stools and abdominal pain/ tenderness below umbilical area. Abdomen soft, positive bowel sounds  Genitourinary: Negative.   Musculoskeletal: Negative.        History of fall  Skin: Negative.   Neurological:       History of vertigo  Endo/Heme/Allergies: Negative.   Psychiatric/Behavioral: Positive for memory loss.       History of senile dementia and psychosis    Physical Exam  Current Medications:   Current Outpatient Prescriptions  Medication Sig Dispense Refill  . amLODipine (NORVASC) 2.5 MG tablet Take 1 tablet (2.5 mg total) by mouth daily. 30 tablet 6  . aspirin EC 81 MG tablet Take 81 mg by mouth every evening.    Marland Kitchen atorvastatin (LIPITOR) 40 MG tablet TAKE 1 TABLET DAILY 90 tablet 2  . Biotin 5000 MCG TABS Take 5,000 mcg by mouth every morning.     . Calcium-Magnesium 500-250 MG TABS Take 2 tablets by mouth daily at 6 PM.     . carvedilol (COREG) 25 MG tablet Take 1 tablet (25 mg total) by mouth 2 (two) times daily with a meal. PATIENT NEEDS TO CONTACT OFFICE FOR ADDITIONAL REFILLS 30 tablet 0  . Cholecalciferol (VITAMIN D3) 2000 UNITS TABS Take 2,000 Units by mouth daily at 12 noon.      . co-enzyme Q-10 50 MG capsule Take 50 mg by mouth daily at 6 PM.     . donepezil (ARICEPT) 5 MG tablet Take 2 tablets (10 mg total) by mouth daily. Take 2 tablets (10 mg) at 7 am 60 tablet 5  . Evening Primrose Oil 500 MG CAPS Take 500 mg by mouth 2 (two) times daily.     . fluticasone (FLONASE) 50 MCG/ACT nasal spray Place 2 sprays into both nostrils at bedtime.    Marland Kitchen ketotifen (THERA TEARS ALLERGY) 0.025 % ophthalmic solution Place 1 drop into both eyes daily as needed (dry eyes).    Marland Kitchen levothyroxine (SYNTHROID, LEVOTHROID) 88 MCG tablet Take 1 tablet (88 mcg total) by mouth daily before breakfast. 90 tablet 0  . lidocaine (LIDODERM) 5 % Place 1 patch onto the skin every 12 (twelve) hours as needed (back pain). Remove & Discard patch within 12 hours or as directed by MD    . mesalamine (LIALDA) 1.2 G EC tablet Take 3.6 g by mouth daily with lunch.     . Multiple Vitamins-Minerals (SUPER VITA-MINS PO) Take 1 tablet by mouth daily at 12 noon.     . Omega 3 1000 MG CAPS Take 1,000 mg by mouth 2 (two) times daily.    . pramipexole (  MIRAPEX) 0.5 MG tablet TAKE 1 TABLET TWICE A DAY (NEED OFFICE VISIT) 180 tablet 2  . PROBIOTIC CAPS Take 1 capsule by mouth daily.    . Simethicone (PHAZYME PO) Take 1-2 tablets by mouth 2 (two) times daily. Take 1 tablet at noon and 2 tablets at 4pm    . traMADol (ULTRAM) 50 MG tablet Take 1 tablet (50 mg total) by mouth every 6 (six) hours as needed. (Patient taking differently: Take 50 mg by mouth every 6 (six) hours as needed for moderate pain. ) 90 tablet 2  . vitamin C (ASCORBIC ACID) 500 MG tablet Take 500 mg by mouth daily at 12 noon.     Marland Kitchen guaifenesin (HUMIBID E) 400 MG TABS Take 400 mg by mouth 2 (two) times daily. Reported on 12/28/2014     No current facility-administered medications for this visit.    Functional Status:   In your present state of health, do you have any difficulty performing the following activities: 12/28/2014 11/22/2014  Hearing? N N   Vision? N N  Difficulty concentrating or making decisions? [name]  Walking or climbing stairs? N N  Dressing or bathing? N N  Doing errands, shopping? Y N  Preparing Food and eating ? N N  Using the Toilet? N N  In the past six months, have you accidently leaked urine? N N  Do you have problems with loss of bowel control? N N  Managing your Medications? N N  Managing your Finances? [name]  Housekeeping or managing your Housekeeping? [name]    Fall/Depression Screening:    PHQ 2/9 Scores 12/28/2014 11/22/2014 06/24/2014 06/10/2012  PHQ - 2 Score 0 0 0 0    Assessment:    Arrived at patients's unit on the 3rd floor of [address] facility. Patient and husband present during this visit. [name] is an 81 year old female living in a 1 bedroom unit with her husband [name])  She reports still having some loose stools about 2- 3 times a day in spite of completing two rounds of antibiotics. She also complained of tenderness below umbilical area on palpation. Patient reports "dull pain comes and goes". Patient mentions seeing her primary care provider recently on a follow- up visit (12/5).   Primary care provider's office called during this visit to cancel patient's appointment with [name] today and was notified that she is going to be referred to a gastroenterologist instead. [name] continues to provide transportation to her appointments.  Medications reviewed with patient. She has all her medication supplies and she continues to manage her medications as reported. Reminded patient to wash her hands thoroughly before preparing medications, after using the bathroom and before/ after preparing food.  Patient was encouraged to drink plenty of fluids to keep her hydrated.  Noted that [place] reached out to patient but patient refused. Reinforced with patient the importance and benefits of having [place] to assist in reviewing and organizing her medications  since she gets confused and forgetful at times. Reminded her that [place] can assist with medication compliance/ adherence. Patient seemed upset and insisted that she follows a system using her medication box. She further states she does not want to change the system that she is used to, which is easy enough for her to follow. Reinforced to contact [place] if needed.  Another option offered to patient and husband is having an aide/ caregiver who can come and assist to make sure  they are taking their medications (right medications and right time). 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   Discussed with patient and husband about possibilities of needing a higher level of care (need more help to care for themselves) is not far from reality.  Patient and husband seemed resistant and responded they will address it when it occurs. Both are insistent that they will stay here as much as they could. Patient and husband further states, if they "see the need then will decide but not ready to do it right now".  Reminded patient regarding [facility] social work services that we offer to assist with these issues or needs.  Encouraged patient and husband to discuss it with family members and notify care management coordinator as soon as they are ready to be able to get assistance or referral.  Patient denies other needs or concerns at this time. She agreed to a routine home visit next month. Encouraged patient to call [facility], care management coordinator and 24 hour nurse-line as necessary.    Plan: Routine home visit on 01/25/15   [facility] CM Care Plan Problem One        Most Recent Value   Care Plan Problem One  impaired cognitive ability related to dementia resulting to inability to take/ complete treatment for C-Difficile diarrhea   Role Documenting the Problem One  Care Management [place] for Problem One  Not  Active   [facility] Long Term Goal (31-90 days)  Patient will be free from diarrhea in the next 31 days   [facility] Long Term Goal Start Date  11/28/14   [facility] Long Term Goal Met Date  12/28/14 [Goal not met-completed antibiotics,still has loose stools]   Interventions for Problem One Long Term Goal  review medications with patient,  [facility] pharmacy referral to assist with review and organization of medications,  encourage medication adherence and emphasize completion of antibioitics as directed even if she starts feeling better,  advise to eat frequent meals rather than large meals,  remind patient not to use medication that will slow diarrhea since it could delay the healing   [facility] CM Short Term Goal #1 (0-30 days)  patient will complete her antibiotics regimen within the next 2 weeks   [facility] CM Short Term Goal #1 Start Date  11/22/14   [facility] CM Short Term Goal #1 Met Date  12/28/14 [Goal met- reports completing 2 rounds of antibiotics ]   [facility] CM Short Term Goal #2 (0-30 days)  patient will reschedule an earlier appointment and will attend follow-up appointment with primary care provider within the next 2 weeks   [facility] CM Short Term Goal #2 Start Date  11/22/14   [facility] CM Short Term Goal #2 Met Date  11/28/14 [Goal met-  had follow-up with Dr. [name] last 11/18]    [facility] CM Care Plan Problem Two        Most Recent Value   Care Plan Problem Two  loose stools and abdominal discomfort persist in spite medications (antibiotics)   Role Documenting the Problem Two  Care Management [place] for Problem Two  Active   Interventions for Problem Two Long Term Goal   follow-up referral to Gastroenterologist by primary care provider,  encourage attendance to Gastroenterologist appointment inorder to be checked and treated,  encourage patient to avoid food that may increase loose stools like dairy products, caffeine,high  fat/ greasy and spicy food,  advise patient to hydrate self,  reiterate to avoid medications to slow  diarrhea because it can slow healing,  follow-up with primary care provider if loose stools does not improve      [facility] Long Term Goal (31-90) days  patient will report loose stool/ diarrhea is controlled and episodes lessened to 1- 2 times a day in the next 60 days   [facility] Long Term Goal Start Date  12/28/14   [facility] CM Short Term Goal #1 (0-30 days)  patient will be referred and seen by gastroenterologist in the next 30 days   [facility] CM Short Term Goal #1 Start Date  12/28/14   Interventions for Short Term Goal #2   communicate with primary provider to notify persisting loose stools and abdominal pain/discomfort inspite completion of antibiotics,   seek referral to Gastroenterologist to be checked for loose bowel movements and abdominal pain,  encourage attendance to scheduled doctor's appointment and adhere to recommendations/ instructions      Stephenson Cichy A. Kendra Woolford, BSN, RN-BC [place] Management Coordinator Cell: [phone]

## 2014-12-30 ENCOUNTER — Other Ambulatory Visit (INDEPENDENT_AMBULATORY_CARE_PROVIDER_SITE_OTHER): Payer: Medicare Other

## 2014-12-30 ENCOUNTER — Ambulatory Visit (INDEPENDENT_AMBULATORY_CARE_PROVIDER_SITE_OTHER): Payer: Medicare Other | Admitting: Gastroenterology

## 2014-12-30 ENCOUNTER — Encounter: Payer: Self-pay | Admitting: Gastroenterology

## 2014-12-30 VITALS — BP 120/72 | HR 78 | Ht 62.0 in | Wt 155.4 lb

## 2014-12-30 DIAGNOSIS — I2510 Atherosclerotic heart disease of native coronary artery without angina pectoris: Secondary | ICD-10-CM

## 2014-12-30 DIAGNOSIS — A0472 Enterocolitis due to Clostridium difficile, not specified as recurrent: Secondary | ICD-10-CM

## 2014-12-30 DIAGNOSIS — A047 Enterocolitis due to Clostridium difficile: Secondary | ICD-10-CM

## 2014-12-30 DIAGNOSIS — R197 Diarrhea, unspecified: Secondary | ICD-10-CM | POA: Diagnosis not present

## 2014-12-30 LAB — CBC WITH DIFFERENTIAL/PLATELET
BASOS ABS: 0 10*3/uL (ref 0.0–0.1)
Basophils Relative: 0.4 % (ref 0.0–3.0)
EOS ABS: 0 10*3/uL (ref 0.0–0.7)
Eosinophils Relative: 0.4 % (ref 0.0–5.0)
HEMATOCRIT: 42.5 % (ref 36.0–46.0)
HEMOGLOBIN: 14 g/dL (ref 12.0–15.0)
LYMPHS PCT: 25 % (ref 12.0–46.0)
Lymphs Abs: 1.4 10*3/uL (ref 0.7–4.0)
MCHC: 33 g/dL (ref 30.0–36.0)
MCV: 95.6 fl (ref 78.0–100.0)
MONO ABS: 0.3 10*3/uL (ref 0.1–1.0)
Monocytes Relative: 5.5 % (ref 3.0–12.0)
Neutro Abs: 3.9 10*3/uL (ref 1.4–7.7)
Neutrophils Relative %: 68.7 % (ref 43.0–77.0)
Platelets: 160 10*3/uL (ref 150.0–400.0)
RBC: 4.44 Mil/uL (ref 3.87–5.11)
RDW: 15 % (ref 11.5–15.5)
WBC: 5.6 10*3/uL (ref 4.0–10.5)

## 2014-12-30 LAB — SEDIMENTATION RATE: SED RATE: 27 mm/h — AB (ref 0–22)

## 2014-12-30 LAB — C-REACTIVE PROTEIN: CRP: 1 mg/dL (ref 0.5–20.0)

## 2014-12-30 NOTE — Progress Notes (Signed)
HPI :  81 y/o female here for evaluation for diarrhea, she is a new patient to our clinic. The patient and her husband accompanying her today both have dementia. She also carries a history of C diff and reported ? Crohns disease.   Patient reports diarrhea has been bothering her more so since September, although endorses chronic loose stools. She reports she has been having 4 BMs per day at baseline. Stools are loose and watery. She denies significant urgency. No blood in the stools. She has had some abdominal cramps in the lower abdomen which can come and go. She denies any weight loss with these symptoms. She is eating well. No nausea or vomiting. She tested positive for C diff back in October, and she reportedly was treated with Flagyl which did not help her. She then received 2 courses of oral vancomycin which she did not think helped her symptoms either. She thinks her symptoms have been the same over time since September. Prior to September she thinks her bowels were same frequency, but the stool became much looser.   She denies any other new medications since September. She takes magnesium supplement she has been on this for a long time at a stable dose. She takes Lialda 3.6gm daily for which she thinks is a history of Crohn's, she is not sure. She does not remember when she last had a colonoscopy. She has been followed by GI in the past but not sure who followed her. She thinks she has been on Lialda for a very long time. She has been on prednisone in the past for her Crohn's. She does not think she has ever been on anti-TNF agents in the past or thiopurines. She says she doesn't remember the details of this history. Chart review shows a prior colonoscopy by Dr. Amedeo Plenty in 2009 which showed some ascending colon inflammation and biopsies showed nonspecific inflammation. We don't have any records from follow up or remote colonoscopy.    Grandmother had colon cancer. She does not know if anyone had  Crohn's disease in the family.   Generally she reports feeling stable at that time. No nocturnal symptoms.   Past Medical History  Diagnosis Date  . OSA (obstructive sleep apnea)   . History of bronchitis   . Other diseases of lung, not elsewhere classified   . Hypertension   . CAD (coronary artery disease)     LAD 30% 2006  . History of atrial flutter   . Cerebrovascular disease   . Hyperlipemia   . Obesity   . GERD (gastroesophageal reflux disease)   . Diverticulosis of colon   . IBS (irritable bowel syndrome)   . Colonic polyp   . Crohn's disease ([place])   . Hypothyroidism   . History of hyperparathyroidism   . Fibromyalgia   . Osteopenia   . History of headache   . Syncope   . Restless legs syndrome (RLS)   . Psychosis   . History of tuberculosis   . Radiation 05/2008    5000 cGy to left lower eyelid, parotid lymphatics and upper neck  . Memory disturbance   . Bipolar affective ([place])     History of psychosis  . 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 C. difficile diarrhea      Past Surgical History  Procedure Laterality Date  . Parathyroid adenoma surgery    . Cholecystectomy    . Moh';s surg left lower eye lid surgery and  lid reconstruction surgery  2/10  . Bladder suspension    . Cataract extraction Bilateral   . Appendectomy    . Tonsillectomy and adenoidectomy    . Lumbar laminectomy    . Incision and drainage perirectal abscess    . Heel spur excision    . Nasal sinus surgery Right     Right maxillary  . Eye lid surgery  04/04/2011    [facility] by Dr. Norlene Duel  . Posterior cervical fusion/foraminotomy N/A 10/12/2013    Procedure: Cervical five-Thoracic two cervico-thoracic posterior fusion;  Surgeon: [name], MD;  Location: [facility];  Service: Neurosurgery;  Laterality: N/A;   Family History  Problem Relation Age of Onset  . Colon cancer Maternal Grandmother   . Arthritis Mother   . Hypertension Mother   . Ulcers Mother   . Heart failure  Father   . Pulmonary embolism     Social History  Substance Use Topics  . Smoking status: Former Smoker -- 6 years    Types: Cigarettes    Quit date: 01/08/1960  . Smokeless tobacco: Never Used  . Alcohol Use: No   Current Outpatient Prescriptions  Medication Sig Dispense Refill  . amLODipine (NORVASC) 2.5 MG tablet Take 1 tablet (2.5 mg total) by mouth daily. 30 tablet 6  . aspirin EC 81 MG tablet Take 81 mg by mouth every evening.    Marland Kitchen atorvastatin (LIPITOR) 40 MG tablet TAKE 1 TABLET DAILY 90 tablet 2  . Biotin 5000 MCG TABS Take 5,000 mcg by mouth every morning.     . Calcium-Magnesium 500-250 MG TABS Take 2 tablets by mouth daily at 6 PM.     . carvedilol (COREG) 25 MG tablet Take 1 tablet (25 mg total) by mouth 2 (two) times daily with a meal. PATIENT NEEDS TO CONTACT OFFICE FOR ADDITIONAL REFILLS 30 tablet 0  . Cholecalciferol (VITAMIN D3) 2000 UNITS TABS Take 2,000 Units by mouth daily at 12 noon.     . co-enzyme Q-10 50 MG capsule Take 50 mg by mouth daily at 6 PM.     . donepezil (ARICEPT) 5 MG tablet Take 2 tablets (10 mg total) by mouth daily. Take 2 tablets (10 mg) at 7 am 60 tablet 5  . Evening Primrose Oil 500 MG CAPS Take 500 mg by mouth 2 (two) times daily.     . fluticasone (FLONASE) 50 MCG/ACT nasal spray Place 2 sprays into both nostrils at bedtime.    Marland Kitchen guaifenesin (HUMIBID E) 400 MG TABS Take 400 mg by mouth 2 (two) times daily. Reported on 12/28/2014    . ketotifen (THERA TEARS ALLERGY) 0.025 % ophthalmic solution Place 1 drop into both eyes daily as needed (dry eyes).    Marland Kitchen levothyroxine (SYNTHROID, LEVOTHROID) 88 MCG tablet Take 1 tablet (88 mcg total) by mouth daily before breakfast. 90 tablet 0  . lidocaine (LIDODERM) 5 % Place 1 patch onto the skin every 12 (twelve) hours as needed (back pain). Remove & Discard patch within 12 hours or as directed by MD    . mesalamine (LIALDA) 1.2 G EC tablet Take 3.6 g by mouth daily with lunch.     . Multiple  Vitamins-Minerals (SUPER VITA-MINS PO) Take 1 tablet by mouth daily at 12 noon.     . Omega 3 1000 MG CAPS Take 1,000 mg by mouth 2 (two) times daily.    . pramipexole (MIRAPEX) 0.5 MG tablet TAKE 1 TABLET TWICE A DAY (NEED OFFICE VISIT) 180  tablet 2  . PROBIOTIC CAPS Take 1 capsule by mouth daily.    . Simethicone (PHAZYME PO) Take 1-2 tablets by mouth 2 (two) times daily. Take 1 tablet at noon and 2 tablets at 4pm    . traMADol (ULTRAM) 50 MG tablet Take 1 tablet (50 mg total) by mouth every 6 (six) hours as needed. (Patient taking differently: Take 50 mg by mouth every 6 (six) hours as needed for moderate pain. ) 90 tablet 2  . vitamin C (ASCORBIC ACID) 500 MG tablet Take 500 mg by mouth daily at 12 noon.      No current facility-administered medications for this visit.   Allergies  Allergen Reactions  . Prednisone Other (See Comments)    Patient had psychiatric episode with hallucinations. Caused 3.5 week long hospitalization & loss of memory   . Carbamazepine Nausea Only and Other (See Comments)    Tegretol; dizziness, blurred vision, nausea, headache, insomnia  . Celebrex [Celecoxib] Other (See Comments)    No relief from pain; ineffective  . Codeine Nausea And Vomiting  . Demerol [Meperidine] Nausea And Vomiting  . Diclofenac Sodium Other (See Comments)    severe headache  . Fentanyl Nausea And Vomiting and Other (See Comments)    IV Fentanyl; caused nausea and vomiting Fentanyl patch; caused chest pain, HBP, with second patch experienced vertigo and nausea  . Fluoxetine Hcl Nausea Only and Other (See Comments)    dizziness, blurred vision, nausea, headache, insomnia  . Hydromorphone Hcl Nausea And Vomiting  . Ibuprofen Other (See Comments)    Caused ulcers and colitis  . Indomethacin Nausea Only and Other (See Comments)    dizziness, blurred vision, nausea, headache, insomnia  . Lisinopril Cough  . Meperidine Hcl Nausea And Vomiting  . Morphine Nausea And Vomiting  .  Oruvail [Ketoprofen] Other (See Comments)    No relief from pain; ineffective  . Pregabalin Other (See Comments)    severe restless legs, insomnia  . Propranolol Hcl Nausea Only and Other (See Comments)    dizziness, blurred vision, nausea, headache, insomnia  . Refresh Lacri-Lube [Artificial Tears] Swelling    Caused swelling, redness, hard crust on eyelids   . Relafen [Nabumetone] Other (See Comments)    No relief from pain; ineffective  . Ropinirole Hcl Other (See Comments)    severe restless legs and insomnia  . Sulfamethoxazole Nausea Only and Other (See Comments)    gas, loose stools  . Bacitracin Hives, Itching and Rash    redness     Review of Systems: All systems reviewed and negative except where noted in HPI.   Lab Results  Component Value Date   WBC 5.8 11/19/2014   HGB 13.1 11/19/2014   HCT 39.6 11/19/2014   MCV 95.9 11/19/2014   PLT 191 11/19/2014    Lab Results  Component Value Date   ALT 25 11/19/2014   AST 32 11/19/2014   ALKPHOS 46 11/19/2014   BILITOT 0.6 11/19/2014    Lab Results  Component Value Date   CREATININE 0.84 11/19/2014   BUN 23* 11/19/2014   NA 141 11/19/2014   K 4.3 11/19/2014   CL 104 11/19/2014   CO2 29 11/19/2014     Physical Exam: BP 120/72 mmHg  Pulse 78  Ht 5' 2"  (1.575 m)  Wt 155 lb 6.4 oz (70.489 kg)  BMI 28.42 kg/m2 Constitutional: Pleasant,  female in no acute distress. HEENT: Normocephalic and atraumatic. Conjunctivae are normal. No scleral icterus. Neck supple.  Cardiovascular:  Normal rate, regular rhythm.  Pulmonary/chest: Effort normal and breath sounds normal. No wheezing, rales or rhonchi. Abdominal: Soft, mild lower abdominal tenderness to palpation, no rebound or guarding. Bowel sounds active throughout. There are no masses palpable. No hepatomegaly. Extremities: trace LE edema Lymphadenopathy: No cervical adenopathy noted. Neurological: Alert and oriented to person place and time. Skin: Skin is warm  and dry. No rashes noted. Psychiatric: Normal mood and affect. Behavior is normal.   ASSESSMENT AND PLAN: 81 y/o female with dementia and reported ? History of Crohn's disease, as well as recent diagnosis of C diff in recent months, presenting with ongoing loose stools. Symptoms as above, they are stable but persistent. Multiple courses of antibiotics for C diff she does not think have changed her course thus far.   In asking the patient and her husband about her reported history of Crohn's, they don't know many details. She had a prior colonoscopy in 2009 showing some right colon inflammation, unclear how long she has been placed on mesalamine. I will try to obtain her prior GI records to clarify this history, as if she definitively has Crohn's, this may well be driving her symptoms. I otherwise recommend obtaining basic labs today including CBC, ESR, and CRP, and will also send stools for C diff to see if this remains positive, as this could also be driving her symptoms if it was not eradicated. If C diff is positive, will treat with another course of antibiotics and see if she improves. Based on labs and her old records, if symptoms persist may proceed with a colonoscopy to see if she has active Crohn's and if she needs a change in management for this issue. She and husband agreed with the plan, all questions answered.   Blakeslee Cellar, MD [facility] Pager [phone]  CC: [name], MD

## 2014-12-30 NOTE — Patient Instructions (Signed)
Your physician has requested that you go to the basement for  lab work before leaving today.   

## 2015-01-03 ENCOUNTER — Telehealth: Payer: Self-pay | Admitting: Family Medicine

## 2015-01-03 NOTE — Telephone Encounter (Signed)
[name], the pt's daughter called extremely concerned about her increasing memory loss. On Christmas Eve relatives went to visit the pt and her husband. Later on that afternoon, Ms. Tull and her husband drove three hours to visit that same relative. When the relative made the comment, "Wow! Twice in one day huh?" Ms. Rieves and her husband responded with, "What are you talking about? We've not seen you in months." [name] realizes she doesn't have POA over Ms. Havlik but she's very concerned and wondering if there's anything she can do to help her parents. Please give her a call if possible.   Yousra Ivens ph# [phone] Thank you.

## 2015-01-04 NOTE — Telephone Encounter (Signed)
Left message for daughter to call back.  

## 2015-01-05 NOTE — Telephone Encounter (Signed)
Pt's daughter called back around 39 today. You were seeing patient. I told her that you would have to return her call at 5.

## 2015-01-08 NOTE — Telephone Encounter (Signed)
i spoke with daughter- MIchelle.   I definitely agree that her parents will need higher level of care soon and certainly could use help with designated POA.  They both have dementia which will progress over time  Let's try to get Mr and Mrs Shores back in over the next 2-3 weeks (could schedule in either of their names) as they come together to discuss advanced directives.

## 2015-01-10 NOTE — Telephone Encounter (Signed)
Spoke with the Kross's and they agreed to come in and discuss advance directives/POA. Both state if something was to happen they would contact their daughters but state both daughters currently have conflicts and they do not want to get them involved in their care. (1) [name] - lives in [place] and wants to 'take' [name] DL away (2) [name] - Lives in [place] and they have had a disagreement. Appointment scheduled for 01/24/15 at 8:30am with Dr. [name] to discuss plans. Patients are aware of objective of appointment.

## 2015-01-12 ENCOUNTER — Telehealth: Payer: Self-pay | Admitting: Gastroenterology

## 2015-01-12 NOTE — Telephone Encounter (Signed)
                                                                                                                                                                                            Left a message for patient's husband to call back.  

## 2015-01-13 ENCOUNTER — Telehealth: Payer: Self-pay | Admitting: Gastroenterology

## 2015-01-13 ENCOUNTER — Other Ambulatory Visit: Payer: Medicare Other

## 2015-01-13 ENCOUNTER — Telehealth: Payer: Self-pay

## 2015-01-13 DIAGNOSIS — R197 Diarrhea, unspecified: Secondary | ICD-10-CM

## 2015-01-13 DIAGNOSIS — A0472 Enterocolitis due to Clostridium difficile, not specified as recurrent: Secondary | ICD-10-CM

## 2015-01-13 NOTE — Telephone Encounter (Signed)
Patient walked in stating that she is still having really loose stools. Patient states this has been going on for the last 48 hours - patient denies any abdominal pain or cramping. Patient was referred to GI doctor. I gave patient and her husband the contact number to Dr. Havery Moros (Had OV on 12/30/14). Husband states they will call GI and see if appt is needed. Thanks!

## 2015-01-13 NOTE — Telephone Encounter (Signed)
Rec'd from [facility] forward 28 pages to Dr.Armbruster

## 2015-01-13 NOTE — Telephone Encounter (Signed)
That's fine. I otherwise have not seen her prior records from Dr. Amedeo Plenty office. Not sure if [name] has received them. Would you mind checking with her? Thanks

## 2015-01-13 NOTE — Telephone Encounter (Signed)
Spoke with patient and her husband. She does not remember getting a stool study kit when she had her visit so she has not returned it. She reports she is now having loose stools 2-3/day. States it is not diarrhea just loose stool. She will come to our lab and get a kit for stool study that was ordered in 12/30/14. Explained to patient this will help [place] to know if she still has c. Diff.

## 2015-01-13 NOTE — Telephone Encounter (Signed)
[name] has not received the records she requested from Dr. Amedeo Plenty. She refaxed the record request.

## 2015-01-14 LAB — CLOSTRIDIUM DIFFICILE BY PCR: Toxigenic C. Difficile by PCR: DETECTED — CR

## 2015-01-16 ENCOUNTER — Other Ambulatory Visit: Payer: Self-pay

## 2015-01-16 MED ORDER — FIDAXOMICIN 200 MG PO TABS: 200.0000 mg | ORAL_TABLET | Freq: Two times a day (BID) | ORAL | Status: DC

## 2015-01-24 ENCOUNTER — Ambulatory Visit: Payer: Medicare Other | Admitting: Family Medicine

## 2015-01-25 ENCOUNTER — Encounter: Payer: Self-pay | Admitting: *Deleted

## 2015-01-25 ENCOUNTER — Telehealth: Payer: Self-pay | Admitting: Family Medicine

## 2015-01-25 ENCOUNTER — Other Ambulatory Visit: Payer: Self-pay | Admitting: *Deleted

## 2015-01-25 NOTE — Telephone Encounter (Signed)
FYI -- Patient has appointment with PCP this Friday, 1/20. Plans to address refusing of [place] then.

## 2015-01-25 NOTE — Patient Outreach (Signed)
[facility]) Care Management   01/25/2015  RUPINDER LIVINGSTON 09/18/1932 [id number]  MARGUETTA WINDISH is an 82 y.o. female  Subjective: Patient reports "doing fine and staying well". Reports no diarrhea but soft stool usually twice a day. Denies any abdominal pain at present.  Objective:  BP 128/60 mmHg  Pulse 65  Resp 20  SpO2 96%  Review of Systems  Constitutional: Negative.   HENT: Negative.   Eyes: Negative.        History of left eyelid surgery (Ca)  Respiratory: Negative.        Lung sounds clear to auscultation  Cardiovascular: Negative for leg swelling.       Regular rate and rhythm  Gastrointestinal: Negative for nausea, vomiting, abdominal pain, diarrhea and constipation.       Abdomen soft, non-tender Bowel sounds present   Genitourinary: Negative.   Musculoskeletal: Negative for back pain, joint pain and falls.  Skin: Negative.   Neurological: Negative.        History of vertigo  Endo/Heme/Allergies: Negative.   Psychiatric/Behavioral: Positive for memory loss. The patient is nervous/anxious.        History of senile dementia and psychosis    Physical Exam  Current Medications:   Current Outpatient Prescriptions  Medication Sig Dispense Refill  . amLODipine (NORVASC) 2.5 MG tablet Take 1 tablet (2.5 mg total) by mouth daily. 30 tablet 6  . aspirin EC 81 MG tablet Take 81 mg by mouth every evening.    Marland Kitchen atorvastatin (LIPITOR) 40 MG tablet TAKE 1 TABLET DAILY 90 tablet 2  . Biotin 5000 MCG TABS Take 5,000 mcg by mouth every morning.     . Calcium-Magnesium 500-250 MG TABS Take 2 tablets by mouth daily at 6 PM.     . carvedilol (COREG) 25 MG tablet Take 1 tablet (25 mg total) by mouth 2 (two) times daily with a meal. PATIENT NEEDS TO CONTACT OFFICE FOR ADDITIONAL REFILLS 30 tablet 0  . Cholecalciferol (VITAMIN D3) 2000 UNITS TABS Take 2,000 Units by mouth daily at 12 noon.     . co-enzyme Q-10 50 MG capsule Take 50 mg by mouth daily at 6 PM.     .  donepezil (ARICEPT) 5 MG tablet Take 2 tablets (10 mg total) by mouth daily. Take 2 tablets (10 mg) at 7 am 60 tablet 5  . Evening Primrose Oil 500 MG CAPS Take 500 mg by mouth 2 (two) times daily.     . fluticasone (FLONASE) 50 MCG/ACT nasal spray Place 2 sprays into both nostrils at bedtime.    Marland Kitchen guaifenesin (HUMIBID E) 400 MG TABS Take 400 mg by mouth 2 (two) times daily. Reported on 12/28/2014    . ketotifen (THERA TEARS ALLERGY) 0.025 % ophthalmic solution Place 1 drop into both eyes daily as needed (dry eyes).    Marland Kitchen levothyroxine (SYNTHROID, LEVOTHROID) 88 MCG tablet Take 1 tablet (88 mcg total) by mouth daily before breakfast. 90 tablet 0  . lidocaine (LIDODERM) 5 % Place 1 patch onto the skin every 12 (twelve) hours as needed (back pain). Remove & Discard patch within 12 hours or as directed by MD    . mesalamine (LIALDA) 1.2 G EC tablet Take 3.6 g by mouth daily with lunch.     . Multiple Vitamins-Minerals (SUPER VITA-MINS PO) Take 1 tablet by mouth daily at 12 noon.     . Omega 3 1000 MG CAPS Take 1,000 mg by mouth 2 (two) times daily.    Marland Kitchen  pramipexole (MIRAPEX) 0.5 MG tablet TAKE 1 TABLET TWICE A DAY (NEED OFFICE VISIT) 180 tablet 2  . PROBIOTIC CAPS Take 1 capsule by mouth daily.    . Simethicone (PHAZYME PO) Take 1-2 tablets by mouth 2 (two) times daily. Take 1 tablet at noon and 2 tablets at 4pm    . traMADol (ULTRAM) 50 MG tablet Take 1 tablet (50 mg total) by mouth every 6 (six) hours as needed. (Patient taking differently: Take 50 mg by mouth every 6 (six) hours as needed for moderate pain. ) 90 tablet 2  . vitamin C (ASCORBIC ACID) 500 MG tablet Take 500 mg by mouth daily at 12 noon.     . fidaxomicin (DIFICID) 200 MG TABS tablet Take 1 tablet (200 mg total) by mouth 2 (two) times daily. 20 tablet 0   No current facility-administered medications for this visit.    Functional Status:   In your present state of health, do you have any difficulty performing the following  activities: 01/25/2015 12/28/2014  Hearing? N N  Vision? N N  Difficulty concentrating or making decisions? [name]  Walking or climbing stairs? N N  Dressing or bathing? N N  Doing errands, shopping? [name]  Preparing Food and eating ? N N  Using the Toilet? N N  In the past six months, have you accidently leaked urine? N N  Do you have problems with loss of bowel control? N N  Managing your Medications? N N  Managing your Finances? [name]  Housekeeping or managing your Housekeeping? [name]    Fall/Depression Screening:    PHQ 2/9 Scores 01/25/2015 12/28/2014 11/22/2014 06/24/2014 06/10/2012  PHQ - 2 Score 0 0 0 0 0    Assessment:   82 year old female being seen for C-diff diarrhea and dementia.  Arrived at patients's unit. Patient and husband present during this visit. Both are residing in a one bedroom unit at [facility] facility. Patient reports being "much better" with diarrhea and currently more on soft stools now per patient, which is usually occuring once in the morning and once in the evening. She denies tenderness, pain or discomfort to abdomen at present. Patient was suppose to see primary care provider yesterday (1/17) for follow-up but missed it. Appointment was rescheduled to 01/27/15 at 2 pm as confirmed with provider's office during this visit.  Husband states they are trying to make a better system to avoid missing their doctor's appointments. [facility] calendar/ notebook provided. [name] continues to drive to their appointments and errands despite family discouraging him- due to age and health issues, per wife's report.   Patient was seen by gastroenterologist (Dr. Duanne Guess) on 12/23 and ordered laboratory works and stool exam to recheck for C-diff. Stool specimen has not been submitted by patient until her diarrhea got worse again on 01/13/15 that yielded to a positive result. Dr. Duanne Guess ordered new antibiotics (Dificid) and was called in to patient's  pharmacy.  Upon medication review, noted that Dificid partial refill of 6 tablets was taken by patient but remaining medications still at the pharmacy as confirmed by husband when he was encouraged to call and verify with pharmacy about the prescription.  Husband states that medication refill is ready to be picked up by noon. Reminded patient and husband to remember to pick-up medication today. Call placed later today and spoke with Dr. Duanne Guess to notify about incomplete dose of Dificid taken by patient and to make sure it is okay  for patient to resume remaining medications. Dr. Duanne Guess was thankful of calling and notifying him. He states it is okay to resume the rest of medication and will have his nurse follow-up on it (given patient's and husband's dementia).  Call placed to [facility]'s pharmacy [facility]) and was told that partial refills could possibly be from what patient's insurance covers for a certain period time. Requested their pharmacy to call and notify patient or husband for partial refill pick-ups so that patient can complete the total dose of medication (Dificid) as ordered by provider. Pharmacy [facility]) agreed. Patient and husband notified about it.  Reminded patient to wash her hands thoroughly before and after preparing medications, preparing food and using the bathroom. Patient was also encouraged to drink plenty of fluids to keep hydrated.  Even after noting that [place] referral was refused by patient on 12/16/14, discussed with patient and husband once again, the importance and benefits of having [place] to assist in reviewing and organizing medications (since she gets confused and forgetful at times). Explained that [place] can be able to assist with medication compliance/ adherence. Patient seemed disappointed and insisted that she has a system- using her medication box which is easy enough for her to follow. She strongly believes that she is managing  medications well and does not want to change anything. She does not want to accept that she has increasing memory loss and had missed on her antibiotic treatments and missed doctors' appointments.  Reminded her that she can contact [facility] if she feels the need for it.  [facility] social work referral had also been offered on different occasions but was refused. Discussed again on this visit regarding [place] work services that can assist with their issues or needs of possible placement to a higher level of care and another option of having an aide/ caregiver who can come and assist to make sure they are taking their medications (right medications and right time).  Patient and husband strongly denies needing more help.  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  Patient and husband seemed not receptive to any assistance offered to them by care management coordinator. Both are insistent of staying here [facility]) as much as they could.  They also declined involving family members (daughters) and states "we take care of our internal affairs,ourselves and we don't need anybody else involved in our care". Call placed to primary care provider's office to notify of attempts to offer other [facility] services but was declined and informed of possible closure of patient's case. Patient and husband are aware of follow-up appointment with primary care provider on 01/27/15 to discuss plans, Advanced Directive/ Power of [place].  Patient denies any other needs or concerns at this time. She agreed to case closure (with most goals met) for refusal of opportunities for care. Encouraged patient to call [facility], care management coordinator and 24 hour nurse-line as necessary or as needs arise. Contact informations with  patient.    Plan: Will close case. Will notify primary care provider of case closure.   [facility] CM Care Plan Problem One        Most Recent Value   Care Plan Problem One  impaired cognitive ability related to dementia resulting to inability to take/ complete treatment for C-Difficile diarrhea  Role Documenting the Problem One  Care Management Person for Problem One  Not Active   [facility] Long Term Goal (31-90 days)  Patient will be free from diarrhea in the next 31 days   [facility] Long Term Goal Start Date  11/28/14   [facility] Long Term Goal Met Date  12/28/14 [Goal not met-completed antibiotics,still has loose stools]   Interventions for Problem One Long Term Goal  review medications with patient,  [facility] pharmacy referral to assist with review and organization of medications,  encourage medication adherence and emphasize completion of antibioitics as directed even if she starts feeling better,  advise to eat frequent meals rather than large meals,  remind patient not to use medication that will slow diarrhea since it could delay the healing   [facility] CM Short Term Goal #1 (0-30 days)  patient will complete her antibiotics regimen within the next 2 weeks   [facility] CM Short Term Goal #1 Start Date  11/22/14   [facility] CM Short Term Goal #1 Met Date  12/28/14 [Goal met- reports completing 2 rounds of antibiotics ]   [facility] CM Short Term Goal #2 (0-30 days)  patient will reschedule an earlier appointment and will attend follow-up appointment with primary care provider within the next 2 weeks   [facility] CM Short Term Goal #2 Start Date  11/22/14   [facility] CM Short Term Goal #2 Met Date  11/28/14 [Goal met-  had follow-up with Dr. [name] last 11/18]    [facility] CM Care Plan Problem Two        Most Recent Value   Care Plan Problem Two  loose stools and abdominal discomfort persist in spite medications (antibiotics)   Role Documenting the Problem Two  Care Management [place] for Problem Two  Not Active   [facility]N  Long Term Goal (31-90) days  patient will report loose stool/ diarrhea is controlled and episodes lessened to 1- 2 times a day in the next 60 days   [facility] Long Term Goal Start Date  12/28/14   [facility] Long Term Goal Met Date  01/25/15   [facility] CM Short Term Goal #1 (0-30 days)  patient will be referred and seen by gastroenterologist in the next 30 days   [facility] CM Short Term Goal #1 Start Date  12/28/14   [facility] CM Short Term Goal #1 Met Date   01/25/15 [Goal met- seen by GI doctor (Dr.Armbuster) on 12/30/14.]      Edwena Felty A. Yon Schiffman, BSN, RN-BC [facility] Management Coordinator Cell: [phone]

## 2015-01-25 NOTE — Telephone Encounter (Addendum)
[name] w/[facility] wanted to let the PCP know that she is trying to offer social worker help on the higher level of care for the patient but she has refused. So since they are refusing, she is going to close the case.

## 2015-01-26 ENCOUNTER — Encounter: Payer: Self-pay | Admitting: *Deleted

## 2015-01-26 ENCOUNTER — Telehealth: Payer: Self-pay | Admitting: Gastroenterology

## 2015-01-26 NOTE — Telephone Encounter (Signed)
Received a call from patient's pharmacy [facility]). Patient was only given 6 tablets because her insurance will only pay for 6 tablets at a time.

## 2015-01-26 NOTE — Telephone Encounter (Signed)
I received your message. Can you please tell her that they need to go back to the pharmacy to get another 3 days worth, and go back again for refills until she has completed the full course. I don't know why the pharmacy only gives 3 days at a time. Thanks

## 2015-01-26 NOTE — Telephone Encounter (Signed)
I received a phone call from home nursing regarding [name] yesterday evening. She was concerned that I had ordered fidaxomycin for [name] but they only picked up 3 days worth at the pharmacy and did not know they were supposed to go back and pick up the rest, as she had completed 3 days worth and then missed a few days. I instructed them to go to the pharmacy and pick up the rest of the medication and complete the course. Otherwise, the nurse was concerned about lack of understanding of the plan, husband as dementia and thinks they need more help at home. Her primary care provider need to be aware of this situation.  [name] can you please call the patient or her husband and make sure they understand to get the rest of the Fidaxomycin and take it as directed. If her symptoms persist following treatment they need to contact us, as her Crohns colitis could be driving these symptoms.  Otherwise I was able to obtain some of her old records from Dr. Amedeo Plenty at [facility], reports as below: EGD 2006 - hiatal hernia 3cm, small antral ulcer Colonoscopy 2009 - marked inflammation of the ascending colon, with mild inflammation of sigmoid and rectum, thought to be Crohns colitis - path showed "ulcerated mucosa", biopsies thought perhaps related to ischemia? She reportedly was diagnosed with Crohns colitis and was maintained on Lialda based on notes from 2010-2014. She reportedly has a history of perirectal abscess in 2010

## 2015-01-26 NOTE — Telephone Encounter (Signed)
Left a message for patient to call back. 

## 2015-01-26 NOTE — Telephone Encounter (Signed)
Unable to reach will try again later 

## 2015-01-26 NOTE — Telephone Encounter (Signed)
Spoke with Edwena Felty at [facility] and she states she is closing the case because the patient refused all her services.

## 2015-01-26 NOTE — Telephone Encounter (Signed)
Can you let the patient know for some reason she can't get the full prescription all at once, but she will need to go back every 3 days until she has completed therapy. After she completes therapy we will see if she is still having symptoms. Thanks

## 2015-01-27 ENCOUNTER — Ambulatory Visit (INDEPENDENT_AMBULATORY_CARE_PROVIDER_SITE_OTHER): Payer: Medicare Other | Admitting: Family Medicine

## 2015-01-27 ENCOUNTER — Encounter: Payer: Self-pay | Admitting: Family Medicine

## 2015-01-27 VITALS — BP 130/80 | HR 70 | Temp 98.2°F | Ht 62.0 in | Wt 167.8 lb

## 2015-01-27 DIAGNOSIS — F0390 Unspecified dementia without behavioral disturbance: Secondary | ICD-10-CM

## 2015-01-27 MED ORDER — DONEPEZIL HCL 10 MG PO TABS: 10.0000 mg | ORAL_TABLET | Freq: Every day | ORAL | Status: DC

## 2015-01-27 NOTE — Progress Notes (Signed)
Pre visit review using our clinic review tool, if applicable. No additional management support is needed unless otherwise documented below in the visit note. 

## 2015-01-27 NOTE — Progress Notes (Signed)
Subjective:    Patient ID: [name], female    DOB: 1932/02/17, 82 y.o.   MRN: [id number]  HPI   Patient is here accompanied by her husband. She has dementia and we've had increasing concerns with both her and her husband regarding long term care issues. They seem to be progressing in a similar pace and we had issues recently with concern over compliance of medication among other things. They have repeatedly mixed up appointment times. On a couple occasions they apparently showed up here on Saturday thinking they had an appointment. When I called this morning to remind them of their appointment they thought they had an 8 AM appointment and that was at 8:06 AM.   [name] has dementia and in September MMSE score of 24.   We started Aricept 5 mg daily and she is supposed to be currently on 10 mg but difficult to confirm. We have no way to determine very well she's taking any of her medications regularly. She's had some recent issues with C. Difficile related diarrhea. We had placed her on 2 separate courses of metronidazole followed by vancomycin. She has seen GI and recently placed on Dificid.   She brings in the bottle which was filled today and has all 6 tablets.   The [name] have  2 daughters. One lives in [place] and one lives in [place]. They state that "they don't trust either of them ". At one point one of the daughters was listed as power of attorney but they rescinded that. They currently do not have power of attorney  Past Medical History  Diagnosis Date  . OSA (obstructive sleep apnea)   . History of bronchitis   . Other diseases of lung, not elsewhere classified   . Hypertension   . CAD (coronary artery disease)     LAD 30% 2006  . History of atrial flutter   . Cerebrovascular disease   . Hyperlipemia   . Obesity   . GERD (gastroesophageal reflux disease)   . Diverticulosis of colon   . IBS (irritable bowel syndrome)   . Colonic polyp   . Crohn's disease ([place])   .  Hypothyroidism   . History of hyperparathyroidism   . Fibromyalgia   . Osteopenia   . History of headache   . Syncope   . Restless legs syndrome (RLS)   . Psychosis   . History of tuberculosis   . Radiation 05/2008    5000 cGy to left lower eyelid, parotid lymphatics and upper neck  . Memory disturbance   . Bipolar affective ([place])     History of psychosis  . 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 C. difficile diarrhea    Past Surgical History  Procedure Laterality Date  . Parathyroid adenoma surgery    . Cholecystectomy    . Moh';s surg left lower eye lid surgery and lid reconstruction surgery  2/10  . Bladder suspension    . Cataract extraction Bilateral   . Appendectomy    . Tonsillectomy and adenoidectomy    . Lumbar laminectomy    . Incision and drainage perirectal abscess    . Heel spur excision    . Nasal sinus surgery Right     Right maxillary  . Eye lid surgery  04/04/2011    [facility] by Dr. Norlene Duel  . Posterior cervical fusion/foraminotomy N/A 10/12/2013    Procedure: Cervical five-Thoracic two cervico-thoracic posterior fusion;  Surgeon: [name],  MD;  Location: [facility];  Service: Neurosurgery;  Laterality: N/A;    reports that she quit smoking about 55 years ago. Her smoking use included Cigarettes. She quit after 6 years of use. She has never used smokeless tobacco. She reports that she does not drink alcohol or use illicit drugs. family history includes Arthritis in her mother; Colon cancer in her maternal grandmother; Heart failure in her father; Hypertension in her mother; Ulcers in her mother. Allergies  Allergen Reactions  . Prednisone Other (See Comments)    Patient had psychiatric episode with hallucinations. Caused 3.5 week long hospitalization & loss of memory   . Carbamazepine Nausea Only and Other (See Comments)    Tegretol; dizziness, blurred vision, nausea, headache, insomnia  . Celebrex [Celecoxib] Other (See Comments)    No relief from  pain; ineffective  . Codeine Nausea And Vomiting  . Demerol [Meperidine] Nausea And Vomiting  . Diclofenac Sodium Other (See Comments)    severe headache  . Fentanyl Nausea And Vomiting and Other (See Comments)    IV Fentanyl; caused nausea and vomiting Fentanyl patch; caused chest pain, HBP, with second patch experienced vertigo and nausea  . Fluoxetine Hcl Nausea Only and Other (See Comments)    dizziness, blurred vision, nausea, headache, insomnia  . Hydromorphone Hcl Nausea And Vomiting  . Ibuprofen Other (See Comments)    Caused ulcers and colitis  . Indomethacin Nausea Only and Other (See Comments)    dizziness, blurred vision, nausea, headache, insomnia  . Lisinopril Cough  . Meperidine Hcl Nausea And Vomiting  . Morphine Nausea And Vomiting  . Oruvail [Ketoprofen] Other (See Comments)    No relief from pain; ineffective  . Pregabalin Other (See Comments)    severe restless legs, insomnia  . Propranolol Hcl Nausea Only and Other (See Comments)    dizziness, blurred vision, nausea, headache, insomnia  . Refresh Lacri-Lube [Artificial Tears] Swelling    Caused swelling, redness, hard crust on eyelids   . Relafen [Nabumetone] Other (See Comments)    No relief from pain; ineffective  . Ropinirole Hcl Other (See Comments)    severe restless legs and insomnia  . Sulfamethoxazole Nausea Only and Other (See Comments)    gas, loose stools  . Bacitracin Hives, Itching and Rash    redness      Review of Systems  Constitutional: Negative for fever and chills.  Respiratory: Negative for shortness of breath.   Cardiovascular: Negative for chest pain.  Gastrointestinal: Negative for nausea, vomiting, constipation and blood in stool.       Objective:   Physical Exam  Constitutional: She appears well-developed and well-nourished.  Neck: Neck supple.  Cardiovascular: Normal rate and regular rhythm.   Pulmonary/Chest: Effort normal and breath sounds normal. No respiratory  distress. She has no wheezes. She has no rales.  Abdominal: Soft. She exhibits no distension and no mass. There is no tenderness. There is no rebound and no guarding.  Musculoskeletal: She exhibits no edema.  Psychiatric: She has a normal mood and affect.  MMSE score of 19 out of 30          Assessment & Plan:  Alzheimer's dementia. Patient had declined and MMSE score currently 19 out of 30. We've recommended that she confirm if she is taking Aricept 10 mg daily not 5 mg. New prescription sent in. We had a long discussion with patient and her husband regarding need for power of attorney and they state they "do not trust either  other daughters". We have strongly encouraged him to consider other options. At this point discussions are difficult as they both have fairly advanced dementia. Spent 30 minutes with patient of which > 50% spent in direct assessment and counseling regarding POA issues, safety issues, reviewing medications.

## 2015-01-27 NOTE — Telephone Encounter (Signed)
Spoke with patient and her husband re: Dificid. Explained that she needs 10 days of medication and will need to go back to pharmacy 2 more times to get enough pills to complete. She voices understanding.

## 2015-01-29 ENCOUNTER — Other Ambulatory Visit: Payer: Self-pay | Admitting: Gastroenterology

## 2015-01-30 ENCOUNTER — Telehealth: Payer: Self-pay | Admitting: Family Medicine

## 2015-01-30 NOTE — Telephone Encounter (Signed)
I have tried MULTIPLE times to call the [phone] number and have been unable to get through.  Does she have another number?

## 2015-01-30 NOTE — Telephone Encounter (Signed)
[name] pt daughter would like dr [name] to return her call concern her mom appt on 01-26-15

## 2015-01-31 NOTE — Telephone Encounter (Signed)
I found another number for [name] --- [phone].

## 2015-02-01 ENCOUNTER — Other Ambulatory Visit: Payer: Self-pay | Admitting: Gastroenterology

## 2015-02-01 NOTE — Telephone Encounter (Signed)
[name] can you please touch base with [name] to ensure they got the antibiotics as we have recommended? Thanks

## 2015-02-01 NOTE — Telephone Encounter (Signed)
[name] at [facility] has tried to verify if patient has taken medication and patient cannot tell what she has taken.

## 2015-02-01 NOTE — Telephone Encounter (Signed)
Thank you for the update. I don't know why she cannot pick up the full course of the medication. I think she has C diff driving her symptoms and she has not been able to receive the medication she needs. If she is still having symptoms please let her know to pick up the rest of her medication. Thanks

## 2015-02-01 NOTE — Telephone Encounter (Signed)
Unable to reach patient. Will try again later. 

## 2015-02-01 NOTE — Telephone Encounter (Signed)
Received a call from Autumn at Ms. Wettstein's [facility]M. Mr. Stronach is there asking for a prescription. Autumn states the patient and her husband come to their office daily with her diarrhea stools in a baggy. Told Autumn we have been trying to reach the patient to verify she completed the Dificid. Autumn spoke with patient's pharmacy on she picked up Dificid 6 tablets on 1/11,1/18, 1/20 and 2 tablets on 1/21.  She asked the patient's husband to call home to speak with patient and he could not remember the number. Patient cannot say if she completed the medication but still reports diarrhea. Patient cannot say which medications she is taking. Autumn reports she is sending adult protective services out to patient's home.

## 2015-02-02 NOTE — Telephone Encounter (Signed)
I remember you speaking to me about this pt yesterday. Someone called from her pharmacy. Is she supposed to have a refill?

## 2015-02-03 ENCOUNTER — Emergency Department (HOSPITAL_COMMUNITY): Payer: Medicare Other

## 2015-02-03 ENCOUNTER — Inpatient Hospital Stay (HOSPITAL_COMMUNITY)
Admission: EM | Admit: 2015-02-03 | Discharge: 2015-02-07 | DRG: 309 | Disposition: A | Discharge status: Home Health | Payer: Medicare Other | Attending: Internal Medicine | Admitting: Internal Medicine

## 2015-02-03 ENCOUNTER — Encounter (HOSPITAL_COMMUNITY): Payer: Self-pay | Admitting: Emergency Medicine

## 2015-02-03 DIAGNOSIS — F0390 Unspecified dementia without behavioral disturbance: Secondary | ICD-10-CM | POA: Diagnosis not present

## 2015-02-03 DIAGNOSIS — G4733 Obstructive sleep apnea (adult) (pediatric): Secondary | ICD-10-CM | POA: Diagnosis present

## 2015-02-03 DIAGNOSIS — I495 Sick sinus syndrome: Secondary | ICD-10-CM | POA: Diagnosis not present

## 2015-02-03 DIAGNOSIS — I10 Essential (primary) hypertension: Secondary | ICD-10-CM | POA: Diagnosis not present

## 2015-02-03 DIAGNOSIS — Z79899 Other long term (current) drug therapy: Secondary | ICD-10-CM | POA: Diagnosis not present

## 2015-02-03 DIAGNOSIS — F29 Unspecified psychosis not due to a substance or known physiological condition: Secondary | ICD-10-CM | POA: Diagnosis present

## 2015-02-03 DIAGNOSIS — E039 Hypothyroidism, unspecified: Secondary | ICD-10-CM | POA: Diagnosis present

## 2015-02-03 DIAGNOSIS — A047 Enterocolitis due to Clostridium difficile: Secondary | ICD-10-CM

## 2015-02-03 DIAGNOSIS — Z885 Allergy status to narcotic agent status: Secondary | ICD-10-CM

## 2015-02-03 DIAGNOSIS — A0472 Enterocolitis due to Clostridium difficile, not specified as recurrent: Secondary | ICD-10-CM | POA: Diagnosis present

## 2015-02-03 DIAGNOSIS — E669 Obesity, unspecified: Secondary | ICD-10-CM | POA: Diagnosis present

## 2015-02-03 DIAGNOSIS — I2510 Atherosclerotic heart disease of native coronary artery without angina pectoris: Secondary | ICD-10-CM | POA: Diagnosis present

## 2015-02-03 DIAGNOSIS — Z888 Allergy status to other drugs, medicaments and biological substances status: Secondary | ICD-10-CM | POA: Diagnosis not present

## 2015-02-03 DIAGNOSIS — M797 Fibromyalgia: Secondary | ICD-10-CM | POA: Diagnosis present

## 2015-02-03 DIAGNOSIS — Z86010 Personal history of colonic polyps: Secondary | ICD-10-CM | POA: Diagnosis not present

## 2015-02-03 DIAGNOSIS — Z882 Allergy status to sulfonamides status: Secondary | ICD-10-CM | POA: Diagnosis not present

## 2015-02-03 DIAGNOSIS — G2581 Restless legs syndrome: Secondary | ICD-10-CM | POA: Diagnosis present

## 2015-02-03 DIAGNOSIS — Z881 Allergy status to other antibiotic agents status: Secondary | ICD-10-CM | POA: Diagnosis not present

## 2015-02-03 DIAGNOSIS — K5090 Crohn's disease, unspecified, without complications: Secondary | ICD-10-CM | POA: Diagnosis present

## 2015-02-03 DIAGNOSIS — R7989 Other specified abnormal findings of blood chemistry: Secondary | ICD-10-CM | POA: Diagnosis not present

## 2015-02-03 DIAGNOSIS — R55 Syncope and collapse: Secondary | ICD-10-CM | POA: Diagnosis present

## 2015-02-03 DIAGNOSIS — Z7982 Long term (current) use of aspirin: Secondary | ICD-10-CM | POA: Diagnosis not present

## 2015-02-03 DIAGNOSIS — K219 Gastro-esophageal reflux disease without esophagitis: Secondary | ICD-10-CM | POA: Diagnosis present

## 2015-02-03 DIAGNOSIS — I483 Typical atrial flutter: Secondary | ICD-10-CM | POA: Diagnosis not present

## 2015-02-03 DIAGNOSIS — Z87891 Personal history of nicotine dependence: Secondary | ICD-10-CM | POA: Diagnosis not present

## 2015-02-03 DIAGNOSIS — Z886 Allergy status to analgesic agent status: Secondary | ICD-10-CM | POA: Diagnosis not present

## 2015-02-03 DIAGNOSIS — E213 Hyperparathyroidism, unspecified: Secondary | ICD-10-CM | POA: Diagnosis present

## 2015-02-03 DIAGNOSIS — E785 Hyperlipidemia, unspecified: Secondary | ICD-10-CM | POA: Diagnosis present

## 2015-02-03 DIAGNOSIS — K589 Irritable bowel syndrome without diarrhea: Secondary | ICD-10-CM | POA: Diagnosis present

## 2015-02-03 DIAGNOSIS — R001 Bradycardia, unspecified: Secondary | ICD-10-CM | POA: Diagnosis not present

## 2015-02-03 DIAGNOSIS — Z8611 Personal history of tuberculosis: Secondary | ICD-10-CM

## 2015-02-03 DIAGNOSIS — R569 Unspecified convulsions: Secondary | ICD-10-CM | POA: Diagnosis not present

## 2015-02-03 DIAGNOSIS — I4892 Unspecified atrial flutter: Secondary | ICD-10-CM | POA: Diagnosis present

## 2015-02-03 LAB — COMPREHENSIVE METABOLIC PANEL
ALBUMIN: 3.6 g/dL (ref 3.5–5.0)
ALT: 127 U/L — AB (ref 14–54)
AST: 118 U/L — AB (ref 15–41)
Alkaline Phosphatase: 122 U/L (ref 38–126)
Anion gap: 12 (ref 5–15)
BILIRUBIN TOTAL: 0.9 mg/dL (ref 0.3–1.2)
BUN: 15 mg/dL (ref 6–20)
CHLORIDE: 104 mmol/L (ref 101–111)
CO2: 26 mmol/L (ref 22–32)
CREATININE: 0.91 mg/dL (ref 0.44–1.00)
Calcium: 9.7 mg/dL (ref 8.9–10.3)
GFR calc Af Amer: >60 mL/min (ref >60)
GFR, EST NON AFRICAN AMERICAN: 58 mL/min — AB (ref >60)
GLUCOSE: 147 mg/dL — AB (ref 65–99)
Potassium: 4.5 mmol/L (ref 3.5–5.1)
Sodium: 142 mmol/L (ref 135–145)
Total Protein: 6.7 g/dL (ref 6.5–8.1)

## 2015-02-03 LAB — CBC WITH DIFFERENTIAL/PLATELET
Basophils Absolute: 0 10*3/uL (ref 0.0–0.1)
Basophils Relative: 0 %
EOS ABS: 0 10*3/uL (ref 0.0–0.7)
EOS PCT: 1 %
HCT: 42.3 % (ref 36.0–46.0)
Hemoglobin: 14.1 g/dL (ref 12.0–15.0)
LYMPHS ABS: 1.8 10*3/uL (ref 0.7–4.0)
Lymphocytes Relative: 24 %
MCH: 31.8 pg (ref 26.0–34.0)
MCHC: 33.3 g/dL (ref 30.0–36.0)
MCV: 95.3 fL (ref 78.0–100.0)
MONO ABS: 0.4 10*3/uL (ref 0.1–1.0)
Monocytes Relative: 6 %
Neutro Abs: 5.2 10*3/uL (ref 1.7–7.7)
Neutrophils Relative %: 69 %
PLATELETS: 164 10*3/uL (ref 150–400)
RBC: 4.44 MIL/uL (ref 3.87–5.11)
RDW: 14 % (ref 11.5–15.5)
WBC: 7.5 10*3/uL (ref 4.0–10.5)

## 2015-02-03 LAB — TROPONIN I

## 2015-02-03 LAB — URINE MICROSCOPIC-ADD ON: RBC / HPF: NONE SEEN RBC/hpf (ref 0–5)

## 2015-02-03 LAB — URINALYSIS, ROUTINE W REFLEX MICROSCOPIC
BILIRUBIN URINE: NEGATIVE
Glucose, UA: NEGATIVE mg/dL
Hgb urine dipstick: NEGATIVE
KETONES UR: NEGATIVE mg/dL
NITRITE: NEGATIVE
PROTEIN: NEGATIVE mg/dL
Specific Gravity, Urine: 1.029 (ref 1.005–1.030)
pH: 5 (ref 5.0–8.0)

## 2015-02-03 LAB — MAGNESIUM: Magnesium: 2.4 mg/dL (ref 1.7–2.4)

## 2015-02-03 LAB — TSH
TSH: 11.783 u[IU]/mL — ABNORMAL HIGH (ref 0.350–4.500)
TSH: 6.294 u[IU]/mL — AB (ref 0.350–4.500)

## 2015-02-03 LAB — I-STAT CHEM 8, ED
BUN: 18 mg/dL (ref 6–20)
CALCIUM ION: 1.18 mmol/L (ref 1.13–1.30)
CHLORIDE: 104 mmol/L (ref 101–111)
Creatinine, Ser: 0.8 mg/dL (ref 0.44–1.00)
GLUCOSE: 152 mg/dL — AB (ref 65–99)
HCT: 46 % (ref 36.0–46.0)
Hemoglobin: 15.6 g/dL — ABNORMAL HIGH (ref 12.0–15.0)
Potassium: 4.4 mmol/L (ref 3.5–5.1)
Sodium: 140 mmol/L (ref 135–145)
TCO2: 26 mmol/L (ref 0–100)

## 2015-02-03 LAB — I-STAT TROPONIN, ED: Troponin i, poc: 0.01 ng/mL (ref 0.00–0.08)

## 2015-02-03 LAB — BRAIN NATRIURETIC PEPTIDE: B NATRIURETIC PEPTIDE 5: 109.2 pg/mL — AB (ref 0.0–100.0)

## 2015-02-03 LAB — MRSA PCR SCREENING: MRSA by PCR: NEGATIVE

## 2015-02-03 MED ORDER — LEVOTHYROXINE SODIUM 88 MCG PO TABS
88.0000 ug | ORAL_TABLET | Freq: Every day | ORAL | Status: DC
  Administered 2015-02-04 – 2015-02-07 (×4): 88 ug via ORAL
  Filled 2015-02-03 (×4): qty 1

## 2015-02-03 MED ORDER — ACETAMINOPHEN 325 MG PO TABS
650.0000 mg | ORAL_TABLET | Freq: Four times a day (QID) | ORAL | Status: DC | PRN
  Administered 2015-02-04 – 2015-02-05 (×2): 650 mg via ORAL
  Filled 2015-02-03 (×2): qty 2

## 2015-02-03 MED ORDER — SODIUM CHLORIDE 0.9 % IV SOLN
INTRAVENOUS | Status: DC
Start: 2015-02-03 — End: 2015-02-04
  Administered 2015-02-03: 19:00:00 via INTRAVENOUS

## 2015-02-03 MED ORDER — MESALAMINE 1.2 G PO TBEC
3.6000 g | DELAYED_RELEASE_TABLET | Freq: Every day | ORAL | Status: DC
  Administered 2015-02-04 – 2015-02-07 (×4): 3.6 g via ORAL
  Filled 2015-02-03 (×6): qty 3

## 2015-02-03 MED ORDER — TRAMADOL HCL 50 MG PO TABS
50.0000 mg | ORAL_TABLET | Freq: Four times a day (QID) | ORAL | Status: DC | PRN
  Administered 2015-02-07: 50 mg via ORAL
  Filled 2015-02-03: qty 1

## 2015-02-03 MED ORDER — KETOTIFEN FUMARATE 0.025 % OP SOLN
1.0000 [drp] | Freq: Every day | OPHTHALMIC | Status: DC | PRN
  Filled 2015-02-03: qty 5

## 2015-02-03 MED ORDER — POLYETHYLENE GLYCOL 3350 17 G PO PACK: 17.0000 g | PACK | Freq: Every day | ORAL | Status: DC | PRN

## 2015-02-03 MED ORDER — FIDAXOMICIN 200 MG PO TABS
200.0000 mg | ORAL_TABLET | Freq: Two times a day (BID) | ORAL | Status: DC
  Administered 2015-02-03 – 2015-02-05 (×5): 200 mg via ORAL
  Filled 2015-02-03 (×8): qty 1

## 2015-02-03 MED ORDER — SORBITOL 70 % SOLN
30.0000 mL | Freq: Every day | Status: DC | PRN
  Filled 2015-02-03: qty 30

## 2015-02-03 MED ORDER — ATORVASTATIN CALCIUM 40 MG PO TABS
40.0000 mg | ORAL_TABLET | Freq: Every day | ORAL | Status: DC
  Administered 2015-02-03 – 2015-02-07 (×4): 40 mg via ORAL
  Filled 2015-02-03 (×5): qty 1

## 2015-02-03 MED ORDER — DONEPEZIL HCL 10 MG PO TABS
10.0000 mg | ORAL_TABLET | Freq: Every day | ORAL | Status: DC
  Administered 2015-02-03 – 2015-02-06 (×4): 10 mg via ORAL
  Filled 2015-02-03 (×5): qty 1

## 2015-02-03 MED ORDER — ONDANSETRON HCL 4 MG PO TABS
4.0000 mg | ORAL_TABLET | Freq: Four times a day (QID) | ORAL | Status: DC | PRN
Start: 2015-02-03 — End: 2015-02-07

## 2015-02-03 MED ORDER — ONDANSETRON HCL 4 MG/2ML IJ SOLN: 4.0000 mg | Freq: Four times a day (QID) | INTRAMUSCULAR | Status: DC | PRN

## 2015-02-03 MED ORDER — ENOXAPARIN SODIUM 40 MG/0.4ML ~~LOC~~ SOLN
40.0000 mg | SUBCUTANEOUS | Status: DC
  Administered 2015-02-03 – 2015-02-06 (×4): 40 mg via SUBCUTANEOUS
  Filled 2015-02-03 (×4): qty 0.4

## 2015-02-03 MED ORDER — ACETAMINOPHEN 650 MG RE SUPP: 650.0000 mg | Freq: Four times a day (QID) | RECTAL | Status: DC | PRN

## 2015-02-03 MED ORDER — SODIUM CHLORIDE 0.9% FLUSH
3.0000 mL | Freq: Two times a day (BID) | INTRAVENOUS | Status: DC
  Administered 2015-02-03 – 2015-02-07 (×7): 3 mL via INTRAVENOUS

## 2015-02-03 MED ORDER — ASPIRIN EC 81 MG PO TBEC
81.0000 mg | DELAYED_RELEASE_TABLET | Freq: Every evening | ORAL | Status: DC
  Administered 2015-02-03 – 2015-02-06 (×4): 81 mg via ORAL
  Filled 2015-02-03 (×4): qty 1

## 2015-02-03 NOTE — ED Provider Notes (Signed)
CSN: [id number]     Arrival date & time 02/03/15  1433 History   First MD Initiated Contact with Patient 02/03/15 1444     Chief Complaint  Patient presents with  . Bradycardia     (Consider location/radiation/quality/duration/timing/severity/associated sxs/prior Treatment) Patient is a 82 y.o. female presenting with syncope.  Loss of Consciousness Episode history:  Single Most recent episode:  Today Duration:  2 minutes Timing:  Rare Progression:  Resolved Chronicity:  New Context: not blood draw and not dehydration   Witnessed: yes   Relieved by:  None tried Associated symptoms: no chest pain, no nausea and no palpitations     Past Medical History  Diagnosis Date  . OSA (obstructive sleep apnea)   . History of bronchitis   . Other diseases of lung, not elsewhere classified   . Hypertension   . CAD (coronary artery disease)     LAD 30% 2006  . History of atrial flutter   . Cerebrovascular disease   . Hyperlipemia   . Obesity   . GERD (gastroesophageal reflux disease)   . Diverticulosis of colon   . IBS (irritable bowel syndrome)   . Colonic polyp   . Crohn's disease ([place])   . Hypothyroidism   . History of hyperparathyroidism   . Fibromyalgia   . Osteopenia   . History of headache   . Syncope   . Restless legs syndrome (RLS)   . Psychosis   . History of tuberculosis   . Radiation 05/2008    5000 cGy to left lower eyelid, parotid lymphatics and upper neck  . Memory disturbance   . Bipolar affective ([place])     History of psychosis  . 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 C. difficile diarrhea    Past Surgical History  Procedure Laterality Date  . Parathyroid adenoma surgery    . Cholecystectomy    . Moh';s surg left lower eye lid surgery and lid reconstruction surgery  2/10  . Bladder suspension    . Cataract extraction Bilateral   . Appendectomy    . Tonsillectomy and adenoidectomy    . Lumbar laminectomy    . Incision and drainage perirectal  abscess    . Heel spur excision    . Nasal sinus surgery Right     Right maxillary  . Eye lid surgery  04/04/2011    [facility] by Dr. Norlene Duel  . Posterior cervical fusion/foraminotomy N/A 10/12/2013    Procedure: Cervical five-Thoracic two cervico-thoracic posterior fusion;  Surgeon: [name], MD;  Location: [place] [facility];  Service: Neurosurgery;  Laterality: N/A;   Family History  Problem Relation Age of Onset  . Colon cancer Maternal Grandmother   . Arthritis Mother   . Hypertension Mother   . Ulcers Mother   . Heart failure Father   . Pulmonary embolism     Social History  Substance Use Topics  . Smoking status: Former Smoker -- 6 years    Types: Cigarettes    Quit date: 01/08/1960  . Smokeless tobacco: Never Used  . Alcohol Use: No   OB History    No data available     Review of Systems  Cardiovascular: Positive for syncope. Negative for chest pain and palpitations.  Gastrointestinal: Negative for nausea.  All other systems reviewed and are negative.     Allergies  Prednisone; Carbamazepine; Celebrex; Codeine; Demerol; Diclofenac sodium; Fentanyl; Fluoxetine hcl; Hydromorphone hcl; Ibuprofen; Indomethacin; Lisinopril; Meperidine hcl; Morphine; Oruvail; Pregabalin;  Propranolol hcl; Refresh lacri-lube; Relafen; Ropinirole hcl; Sulfamethoxazole; and Bacitracin  Home Medications   Prior to Admission medications   Medication Sig Start Date End Date Taking? Authorizing Provider  amLODipine (NORVASC) 2.5 MG tablet Take 1 tablet (2.5 mg total) by mouth daily. 10/03/14   [name], MD  aspirin EC 81 MG tablet Take 81 mg by mouth every evening.    Historical Provider, MD  atorvastatin (LIPITOR) 40 MG tablet TAKE 1 TABLET DAILY 07/12/14   [name], MD  Biotin 5000 MCG TABS Take 5,000 mcg by mouth every morning.     Historical Provider, MD  Calcium-Magnesium 500-250 MG TABS Take 2 tablets by mouth daily at 6 PM.  02/18/11   Tammy S Parrett, NP  carvedilol (COREG)  25 MG tablet Take 1 tablet (25 mg total) by mouth 2 (two) times daily with a meal. PATIENT NEEDS TO CONTACT OFFICE FOR ADDITIONAL REFILLS 08/23/14   Minus Breeding, MD  Cholecalciferol (VITAMIN D3) 2000 UNITS TABS Take 2,000 Units by mouth daily at 12 noon.     Historical Provider, MD  co-enzyme Q-10 50 MG capsule Take 50 mg by mouth daily at 6 PM.     Historical Provider, MD  donepezil (ARICEPT) 10 MG tablet Take 1 tablet (10 mg total) by mouth at bedtime. 01/27/15   [name], MD  Evening Primrose Oil 500 MG CAPS Take 500 mg by mouth 2 (two) times daily.     Historical Provider, MD  fidaxomicin (DIFICID) 200 MG TABS tablet Take 1 tablet (200 mg total) by mouth 2 (two) times daily. 01/16/15   Manus Gunning, MD  fluticasone (FLONASE) 50 MCG/ACT nasal spray Place 2 sprays into both nostrils at bedtime.    Historical Provider, MD  guaifenesin (HUMIBID E) 400 MG TABS Take 400 mg by mouth 2 (two) times daily. Reported on 12/28/2014    Historical Provider, MD  ketotifen (THERA TEARS ALLERGY) 0.025 % ophthalmic solution Place 1 drop into both eyes daily as needed (dry eyes).    Historical Provider, MD  levothyroxine (SYNTHROID, LEVOTHROID) 88 MCG tablet Take 1 tablet (88 mcg total) by mouth daily before breakfast. 07/19/14   [name], MD  lidocaine (LIDODERM) 5 % Place 1 patch onto the skin every 12 (twelve) hours as needed (back pain). Remove & Discard patch within 12 hours or as directed by MD    Historical Provider, MD  mesalamine (LIALDA) 1.2 G EC tablet Take 3.6 g by mouth daily with lunch.     Historical Provider, MD  Multiple Vitamins-Minerals (SUPER VITA-MINS PO) Take 1 tablet by mouth daily at 12 noon.     Historical Provider, MD  Omega 3 1000 MG CAPS Take 1,000 mg by mouth 2 (two) times daily.    Historical Provider, MD  pramipexole (MIRAPEX) 0.5 MG tablet TAKE 1 TABLET TWICE A DAY (NEED OFFICE VISIT) 12/05/14   [name], MD  PROBIOTIC CAPS Take 1 capsule by mouth  daily. 02/18/11   Tammy S Parrett, NP  Simethicone (PHAZYME PO) Take 1-2 tablets by mouth 2 (two) times daily. Take 1 tablet at noon and 2 tablets at 4pm    Historical Provider, MD  traMADol (ULTRAM) 50 MG tablet Take 1 tablet (50 mg total) by mouth every 6 (six) hours as needed. Patient taking differently: Take 50 mg by mouth every 6 (six) hours as needed for moderate pain.  11/25/13   [name], MD  vitamin C (ASCORBIC ACID) 500  MG tablet Take 500 mg by mouth daily at 12 noon.     Historical Provider, MD   BP 123/60 mmHg  Pulse 37  Temp(Src) 97.5 F (36.4 C) (Oral)  Resp 18  SpO2 98% Physical Exam  Constitutional: She is oriented to person, place, and time. She appears well-developed and well-nourished.  HENT:  Head: Normocephalic and atraumatic.  Neck: Normal range of motion.  Cardiovascular: Regular rhythm.  Bradycardia present.   Pulmonary/Chest: Effort normal. No stridor. No respiratory distress.  Abdominal: She exhibits no distension.  Musculoskeletal: Normal range of motion. She exhibits no edema or tenderness.  Neurological: She is alert and oriented to person, place, and time. No cranial nerve deficit.  Skin: Skin is warm and dry. No rash noted. No erythema.  Nursing note and vitals reviewed.   ED Course  Procedures (including critical care time)  CRITICAL CARE Performed by: Merrily Pew  Total critical care time: 30 minutes Critical care time was exclusive of separately billable procedures and treating other patients. Critical care was necessary to treat or prevent imminent or life-threatening deterioration. Critical care was time spent personally by me on the following activities: development of treatment plan with patient and/or surrogate as well as nursing, discussions with consultants, evaluation of patient's response to treatment, examination of patient, obtaining history from patient or surrogate, ordering and performing treatments and interventions,  ordering and review of laboratory studies, ordering and review of radiographic studies, pulse oximetry and re-evaluation of patient's condition.   Labs Review Labs Reviewed  COMPREHENSIVE METABOLIC PANEL - Abnormal; Notable for the following:    Glucose, Bld 147 (*)    AST 118 (*)    ALT 127 (*)    GFR calc non Af Amer 58 (*)    All other components within normal limits  BRAIN NATRIURETIC PEPTIDE - Abnormal; Notable for the following:    B Natriuretic Peptide 109.2 (*)    All other components within normal limits  TSH - Abnormal; Notable for the following:    TSH 11.783 (*)    All other components within normal limits  TSH - Abnormal; Notable for the following:    TSH 6.294 (*)    All other components within normal limits  BASIC METABOLIC PANEL - Abnormal; Notable for the following:    Glucose, Bld 104 (*)    All other components within normal limits  CBC - Abnormal; Notable for the following:    Platelets 149 (*)    All other components within normal limits  URINALYSIS, ROUTINE W REFLEX MICROSCOPIC (NOT AT [facility]) - Abnormal; Notable for the following:    APPearance CLOUDY (*)    Leukocytes, UA SMALL (*)    All other components within normal limits  URINE MICROSCOPIC-ADD ON - Abnormal; Notable for the following:    Squamous Epithelial / LPF 6-30 (*)    Bacteria, UA MANY (*)    Crystals CA OXALATE CRYSTALS (*)    All other components within normal limits  HEPATIC FUNCTION PANEL - Abnormal; Notable for the following:    Total Protein 6.4 (*)    Albumin 3.4 (*)    AST 61 (*)    ALT 91 (*)    All other components within normal limits  ACETAMINOPHEN LEVEL - Abnormal; Notable for the following:    Acetaminophen (Tylenol), Serum <10 (*)    All other components within normal limits  I-STAT CHEM 8, ED - Abnormal; Notable for the following:    Glucose, Bld 152 (*)  Hemoglobin 15.6 (*)    All other components within normal limits  MRSA PCR SCREENING  CBC WITH  DIFFERENTIAL/PLATELET  TROPONIN I  TROPONIN I  TROPONIN I  MAGNESIUM  HEPATITIS PANEL, ACUTE  I-STAT TROPOININ, ED    Imaging Review Dg Chest Portable 1 View  02/03/2015  CLINICAL DATA:  Syncope and bradycardia for 1 day EXAM: PORTABLE CHEST 1 VIEW COMPARISON:  September 28, 2014 FINDINGS: There is no edema or consolidation. Heart is upper normal in size with pulmonary vascularity within normal limits. No adenopathy. There is postoperative change in each shoulder and in the lower cervical spine region. IMPRESSION: No edema or consolidation.  No change in cardiac silhouette. Electronically Signed   By: Lowella Grip III M.D.   On: 02/03/2015 15:12   I have personally reviewed and evaluated these images and lab results as part of my medical decision-making.   EKG Interpretation   Date/Time:  Friday February 03 2015 14:42:33 EST Ventricular Rate:  31 PR Interval:  135 QRS Duration: 89 QT Interval:  523 QTC Calculation: 375 R Axis:   66 Text Interpretation:  Sinus bradycardia Supraventricular bigeminy Consider  left atrial enlargement Confirmed by [facility] MD, Corene Cornea [phone]) on 02/03/2015  3:25:18 PM      MDM   Final diagnoses:  Bradycardia  Syncope   Syncopal episode with bradycardia. On exam here, still dipping into 20's. Pacer pads placed immediately, code cart at bedside. mentating well, suspect it is baseline, BP normal at that time. considdered multiple causes for her symptoms to include, illness, heart abnormality, heart block, overdose. H/O dementia, per the notes, possible that she mistook her coreg v is on an improper dose. Also possible it is sick sinus as her ECG actually doesn't show prolonged PR as would be expected with a beta blocker over medication. No CP or other symptoms to suggest ACS, no other medications noticed to exacerbate symptoms. Cardiology consulted and defers to medicine admission 2/2 multiple comorbidities. Admitted to stepdown with medicine.      Merrily Pew, MD 02/04/15 [phone]

## 2015-02-03 NOTE — Consult Note (Signed)
Reason for Consult:   Syncope and collapse in setting of bradycardia  Requesting Physician: ED Primary Cardiologist Dr Percival Spanish last saw pt in 2013  HPI:   82 y/o [place] patient with a history of somewhat difficult to control hypertension as well as a number of drug intolerances. She also has a history of atrial flutter s/p RFA 2005 without recurrence, HLD, and minor CAD at cath 2006. She has seen Dr. Haroldine Laws periodically over the years. She last saw Dr Percival Spanish in 2013. Records from her PCP suggest a cognitive decline and in fact he is concerned both the pt and her husband have some degree of Alzheimer's dementia.             The pt presents to the ED today after a syncopal spell at home. She has been treated for C-Diff colitis over the past few weeks. She and her husband admit she hasn't taken the full course of treatment as an OP but she tells me her diarrhea has improved. Today she got up to use the bathroom and collapsed. Her husband witness the event and was able to catch her before hitting the floor. EKG by EMS shows a rate of 31- sinus bradycardia. The pt denies any previous history of syncope and denies any tachycardia or chest pain as an OP.   PMHx:  Past Medical History  Diagnosis Date  . OSA (obstructive sleep apnea)   . History of bronchitis   . Other diseases of lung, not elsewhere classified   . Hypertension   . CAD (coronary artery disease)     LAD 30% 2006  . History of atrial flutter   . Cerebrovascular disease   . Hyperlipemia   . Obesity   . GERD (gastroesophageal reflux disease)   . Diverticulosis of colon   . IBS (irritable bowel syndrome)   . Colonic polyp   . Crohn's disease ([place])   . Hypothyroidism   . History of hyperparathyroidism   . Fibromyalgia   . Osteopenia   . History of headache   . Syncope   . Restless legs syndrome (RLS)   . Psychosis   . History of tuberculosis   . Radiation 05/2008    5000 cGy to left lower eyelid, parotid  lymphatics and upper neck  . Memory disturbance   . Bipolar affective ([place])     History of psychosis  . 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 C. difficile diarrhea     Past Surgical History  Procedure Laterality Date  . Parathyroid adenoma surgery    . Cholecystectomy    . Moh';s surg left lower eye lid surgery and lid reconstruction surgery  2/10  . Bladder suspension    . Cataract extraction Bilateral   . Appendectomy    . Tonsillectomy and adenoidectomy    . Lumbar laminectomy    . Incision and drainage perirectal abscess    . Heel spur excision    . Nasal sinus surgery Right     Right maxillary  . Eye lid surgery  04/04/2011    [facility] by Dr. Norlene Duel  . Posterior cervical fusion/foraminotomy N/A 10/12/2013    Procedure: Cervical five-Thoracic two cervico-thoracic posterior fusion;  Surgeon: [name], MD;  Location: [facility];  Service: Neurosurgery;  Laterality: N/A;    SOCHx:  reports that she quit smoking about 55 years ago. Her smoking use included Cigarettes. She quit after 6 years  of use. She has never used smokeless tobacco. She reports that she does not drink alcohol or use illicit drugs.  FAMHx: Family History  Problem Relation Age of Onset  . Colon cancer Maternal Grandmother   . Arthritis Mother   . Hypertension Mother   . Ulcers Mother   . Heart failure Father   . Pulmonary embolism      ALLERGIES: Allergies  Allergen Reactions  . Prednisone Other (See Comments)    Patient had psychiatric episode with hallucinations. Caused 3.5 week long hospitalization & loss of memory   . Carbamazepine Nausea Only and Other (See Comments)    Tegretol; dizziness, blurred vision, nausea, headache, insomnia  . Celebrex [Celecoxib] Other (See Comments)    No relief from pain; ineffective  . Codeine Nausea And Vomiting  . Demerol [Meperidine] Nausea And Vomiting  . Diclofenac Sodium Other (See Comments)    severe headache  . Fentanyl Nausea And Vomiting  and Other (See Comments)    IV Fentanyl; caused nausea and vomiting Fentanyl patch; caused chest pain, HBP, with second patch experienced vertigo and nausea  . Fluoxetine Hcl Nausea Only and Other (See Comments)    dizziness, blurred vision, nausea, headache, insomnia  . Hydromorphone Hcl Nausea And Vomiting  . Ibuprofen Other (See Comments)    Caused ulcers and colitis  . Indomethacin Nausea Only and Other (See Comments)    dizziness, blurred vision, nausea, headache, insomnia  . Lisinopril Cough  . Meperidine Hcl Nausea And Vomiting  . Morphine Nausea And Vomiting  . Oruvail [Ketoprofen] Other (See Comments)    No relief from pain; ineffective  . Pregabalin Other (See Comments)    severe restless legs, insomnia  . Propranolol Hcl Nausea Only and Other (See Comments)    dizziness, blurred vision, nausea, headache, insomnia  . Refresh Lacri-Lube [Artificial Tears] Swelling    Caused swelling, redness, hard crust on eyelids   . Relafen [Nabumetone] Other (See Comments)    No relief from pain; ineffective  . Ropinirole Hcl Other (See Comments)    severe restless legs and insomnia  . Sulfamethoxazole Nausea Only and Other (See Comments)    gas, loose stools  . Bacitracin Hives, Itching and Rash    redness    ROS: Review of Systems: General: negative for chills, fever, night sweats or weight changes.  Cardiovascular: negative for chest pain, dyspnea on exertion, edema, orthopnea, palpitations, paroxysmal nocturnal dyspnea or shortness of breath HEENT: negative for any visual disturbances, blindness, glaucoma Dermatological: negative for rash Respiratory: negative for cough, hemoptysis, or wheezing Urologic: negative for hematuria or dysuria Abdominal: negative for nausea, vomiting, diarrhea, bright red blood per rectum, melena, or hematemesis Neurologic: negative for visual changes, syncope, or dizziness Musculoskeletal: negative for back pain, joint pain, or swelling Psych:  cooperative and appropriate All other systems reviewed and are otherwise negative except as noted above.   HOME MEDICATIONS: Prior to Admission medications   Medication Sig Start Date End Date Taking? Authorizing Provider  amLODipine (NORVASC) 2.5 MG tablet Take 1 tablet (2.5 mg total) by mouth daily. 10/03/14   [name], MD  aspirin EC 81 MG tablet Take 81 mg by mouth every evening.    Historical Provider, MD  atorvastatin (LIPITOR) 40 MG tablet TAKE 1 TABLET DAILY 07/12/14   [name], MD  Biotin 5000 MCG TABS Take 5,000 mcg by mouth every morning.     Historical Provider, MD  Calcium-Magnesium 500-250 MG TABS Take 2 tablets by mouth daily  at 6 PM.  02/18/11   Tammy S Parrett, NP  carvedilol (COREG) 25 MG tablet Take 1 tablet (25 mg total) by mouth 2 (two) times daily with a meal. PATIENT NEEDS TO CONTACT OFFICE FOR ADDITIONAL REFILLS 08/23/14   Minus Breeding, MD  Cholecalciferol (VITAMIN D3) 2000 UNITS TABS Take 2,000 Units by mouth daily at 12 noon.     Historical Provider, MD  co-enzyme Q-10 50 MG capsule Take 50 mg by mouth daily at 6 PM.     Historical Provider, MD  donepezil (ARICEPT) 10 MG tablet Take 1 tablet (10 mg total) by mouth at bedtime. 01/27/15   [name], MD  Evening Primrose Oil 500 MG CAPS Take 500 mg by mouth 2 (two) times daily.     Historical Provider, MD  fidaxomicin (DIFICID) 200 MG TABS tablet Take 1 tablet (200 mg total) by mouth 2 (two) times daily. 01/16/15   Manus Gunning, MD  fluticasone (FLONASE) 50 MCG/ACT nasal spray Place 2 sprays into both nostrils at bedtime.    Historical Provider, MD  guaifenesin (HUMIBID E) 400 MG TABS Take 400 mg by mouth 2 (two) times daily. Reported on 12/28/2014    Historical Provider, MD  ketotifen (THERA TEARS ALLERGY) 0.025 % ophthalmic solution Place 1 drop into both eyes daily as needed (dry eyes).    Historical Provider, MD  levothyroxine (SYNTHROID, LEVOTHROID) 88 MCG tablet Take 1 tablet (88 mcg  total) by mouth daily before breakfast. 07/19/14   [name], MD  lidocaine (LIDODERM) 5 % Place 1 patch onto the skin every 12 (twelve) hours as needed (back pain). Remove & Discard patch within 12 hours or as directed by MD    Historical Provider, MD  mesalamine (LIALDA) 1.2 G EC tablet Take 3.6 g by mouth daily with lunch.     Historical Provider, MD  Multiple Vitamins-Minerals (SUPER VITA-MINS PO) Take 1 tablet by mouth daily at 12 noon.     Historical Provider, MD  Omega 3 1000 MG CAPS Take 1,000 mg by mouth 2 (two) times daily.    Historical Provider, MD  pramipexole (MIRAPEX) 0.5 MG tablet TAKE 1 TABLET TWICE A DAY (NEED OFFICE VISIT) 12/05/14   [name], MD  PROBIOTIC CAPS Take 1 capsule by mouth daily. 02/18/11   Tammy S Parrett, NP  Simethicone (PHAZYME PO) Take 1-2 tablets by mouth 2 (two) times daily. Take 1 tablet at noon and 2 tablets at 4pm    Historical Provider, MD  traMADol (ULTRAM) 50 MG tablet Take 1 tablet (50 mg total) by mouth every 6 (six) hours as needed. Patient taking differently: Take 50 mg by mouth every 6 (six) hours as needed for moderate pain.  11/25/13   [name], MD  vitamin C (ASCORBIC ACID) 500 MG tablet Take 500 mg by mouth daily at 12 noon.     Historical Provider, MD    HOSPITAL MEDICATIONS: I have reviewed the patient's current medications.  VITALS: Blood pressure 123/60, pulse 37, temperature 97.5 F (36.4 C), temperature source Oral, resp. rate 18, SpO2 98 %.  PHYSICAL EXAM: General appearance: alert, cooperative, no distress and pale Neck: no carotid bruit and no JVD Lungs: clear to auscultation bilaterally Heart: regular rate and rhythm and slow rate Abdomen: soft, non-tender; bowel sounds normal; no masses,  no organomegaly Extremities: extremities normal, atraumatic, no cyanosis or edema Pulses: 2+ and symmetric Skin: pale, cool, dry Neurologic: Grossly normal  LABS: Results for orders placed or  performed during the  hospital encounter of 02/03/15 (from the past 24 hour(s))  CBC with Differential     Status: None   Collection Time: 02/03/15  3:00 PM  Result Value Ref Range   WBC 7.5 4.0 - 10.5 K/uL   RBC 4.44 3.87 - 5.11 MIL/uL   Hemoglobin 14.1 12.0 - 15.0 g/dL   HCT 42.3 36.0 - 46.0 %   MCV 95.3 78.0 - 100.0 fL   MCH 31.8 26.0 - 34.0 pg   MCHC 33.3 30.0 - 36.0 g/dL   RDW 14.0 11.5 - 15.5 %   Platelets 164 150 - 400 K/uL   Neutrophils Relative % 69 %   Neutro Abs 5.2 1.7 - 7.7 K/uL   Lymphocytes Relative 24 %   Lymphs Abs 1.8 0.7 - 4.0 K/uL   Monocytes Relative 6 %   Monocytes Absolute 0.4 0.1 - 1.0 K/uL   Eosinophils Relative 1 %   Eosinophils Absolute 0.0 0.0 - 0.7 K/uL   Basophils Relative 0 %   Basophils Absolute 0.0 0.0 - 0.1 K/uL  I-Stat Troponin, ED (not at [facility])     Status: None   Collection Time: 02/03/15  3:22 PM  Result Value Ref Range   Troponin i, poc 0.01 0.00 - 0.08 ng/mL   Comment 3          I-Stat Chem 8, ED     Status: Abnormal   Collection Time: 02/03/15  3:24 PM  Result Value Ref Range   Sodium 140 135 - 145 mmol/L   Potassium 4.4 3.5 - 5.1 mmol/L   Chloride 104 101 - 111 mmol/L   BUN 18 6 - 20 mg/dL   Creatinine, Ser 0.80 0.44 - 1.00 mg/dL   Glucose, Bld 152 (H) 65 - 99 mg/dL   Calcium, Ion 1.18 1.13 - 1.30 mmol/L   TCO2 26 0 - 100 mmol/L   Hemoglobin 15.6 (H) 12.0 - 15.0 g/dL   HCT 46.0 36.0 - 46.0 %    EKG: NSR, SB- 31  IMAGING: Dg Chest Portable 1 View  02/03/2015  CLINICAL DATA:  Syncope and bradycardia for 1 day EXAM: PORTABLE CHEST 1 VIEW COMPARISON:  September 28, 2014 FINDINGS: There is no edema or consolidation. Heart is upper normal in size with pulmonary vascularity within normal limits. No adenopathy. There is postoperative change in each shoulder and in the lower cervical spine region. IMPRESSION: No edema or consolidation.  No change in cardiac silhouette. Electronically Signed   By: Lowella Grip III M.D.   On: 02/03/2015 15:12     IMPRESSION: Principal Problem:   Syncope and collapse Active Problems:   Sinus bradycardia   Essential hypertension   Senile dementia   C. difficile colitis   Hypothyroidism   Hyperlipidemia   H/O Psychosis in past, hospitaluized in '09, in ED Nov 2015   CAD- minor CAD 2006   H/O A flutter, s/p RFA 2005   RECOMMENDATION: Hold Coreg and monitor. She is currently not symptomatic with HR 45. We could add Dopamine to increase her HR if it drops again into the 30's or she becomes symptomatic. Check TSH (normal in Sept 2016), echo for LVF, and Mg++.   Time Spent Directly with Patient: [address] minutes  Kerin Ransom, [place] beeper 02/03/2015, 3:53 PM   Personally seen and examined. Agree with above.  82 year old female with prior episodes of psychosis, C. difficile colitis who had a syncopal episode after getting up to use the bathroom. Her husband  caught her. Here in the emergency room was found to have significant bradycardia, heart rates in the 30s and 40s. Normal PR interval, no complete heart block.  -Recommend 2H, ccu or stepdown bed -Zoll Pads are in place -She is on high-dose carvedilol 25 mg twice a day. We will hold. -Currently she is asymptomatic. -If heart rate does not improve despite washout of carvedilol 25 twice a day, pacemaker placement is advised. -Currently she is hemodynamically stable, mentating well. -We will have low threshold for temporary pacemaker wire if she clinically begins to deteriorate. -Checking TSH  We will follow closely.  Candee Furbish, MD

## 2015-02-03 NOTE — H&P (Signed)
[facility] History and Physical  STEPHANEE BARCOMB MBE:[id number] DOB: 1932/06/20 DOA: 02/03/2015  Referring physician:  Merrily Pew PCP:  [name], MD   Chief Complaint:  Syncope  HPI:  The patient is a 82 y.o. year-old female with history of Crohn's disease, recent C. difficile colitis, hypothyroidism, syncope, tuberculosis, bipolar affective disorder, hyperlipidemia, and progressive dementia who presents with syncope.  The patient lives with her husband who is also demented. There are several notes by the primary care doctor documenting concerns about safety and needing a healthcare power of attorney. When I asked her about her memory loss and her husband's dementia and her conversations with her primary care doctor, she had no recollection.  The patient was unable to tell me the reason why she was in the hospital and there is no family present. According to the notes, she was having a bowel movement today and she had a loss of consciousness afterwards.  She currently denies fevers, chills, nausea, shortness of breath, chest pain, dysuria.    In the emergency department, her vital signs were notable for bradycardia to the low 30s. Cardiology was consulted and recommended discontinuing her beta blocker and monitoring her in a stepdown unit with low threshold for placing a temporary pacing wire if needed.  She had no leukocytosis, evidence of pneumonia, and we are awaiting a urinalysis.  Troponin was negative.    Review of Systems:  Limited by patient's dementia. General:  Denies fevers, chills HEENT:  Deniessinus congestion, sore throat CV:  Denies chest pain and palpitations  PULM:  Denies SOB, wheezing  GI:  Denies nausea, and had a formed stool per RN since coming to hospital GU:  Denies dysuria, frequency, urgency ENDO:  Denies polyuria, polydipsia.   HEME:  Denies blood in stools, melena  LYMPH:  Denies lymphadenopathy.   MSK:  Denies arthralgias, myalgias.   DERM:  Denies  skin rash or ulcer.   NEURO:  Denies focal numbness, weakness, slurred speech, confusion, facial droop.  PSYCH:  Denies anxiety and depression.    Past Medical History  Diagnosis Date  . OSA (obstructive sleep apnea)   . History of bronchitis   . Other diseases of lung, not elsewhere classified   . Hypertension   . CAD (coronary artery disease)     LAD 30% 2006  . History of atrial flutter   . Cerebrovascular disease   . Hyperlipemia   . Obesity   . GERD (gastroesophageal reflux disease)   . Diverticulosis of colon   . IBS (irritable bowel syndrome)   . Colonic polyp   . Crohn's disease ([place])   . Hypothyroidism   . History of hyperparathyroidism   . Fibromyalgia   . Osteopenia   . History of headache   . Syncope   . Restless legs syndrome (RLS)   . Psychosis   . History of tuberculosis   . Radiation 05/2008    5000 cGy to left lower eyelid, parotid lymphatics and upper neck  . Memory disturbance   . Bipolar affective ([place])     History of psychosis  . 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 C. difficile diarrhea    Past Surgical History  Procedure Laterality Date  . Parathyroid adenoma surgery    . Cholecystectomy    . Moh';s surg left lower eye lid surgery and lid reconstruction surgery  2/10  . Bladder suspension    . Cataract extraction Bilateral   . Appendectomy    .  Tonsillectomy and adenoidectomy    . Lumbar laminectomy    . Incision and drainage perirectal abscess    . Heel spur excision    . Nasal sinus surgery Right     Right maxillary  . Eye lid surgery  04/04/2011    [facility] by Dr. Norlene Duel  . Posterior cervical fusion/foraminotomy N/A 10/12/2013    Procedure: Cervical five-Thoracic two cervico-thoracic posterior fusion;  Surgeon: [name], MD;  Location: [facility];  Service: Neurosurgery;  Laterality: N/A;   Social History:  reports that she quit smoking about 55 years ago. Her smoking use included Cigarettes. She quit after 6 years of use.  She has never used smokeless tobacco. She reports that she does not drink alcohol or use illicit drugs.  Allergies  Allergen Reactions  . Prednisone Other (See Comments)    Patient had psychiatric episode with hallucinations. Caused 3.5 week long hospitalization & loss of memory   . Carbamazepine Nausea Only and Other (See Comments)    Tegretol; dizziness, blurred vision, nausea, headache, insomnia  . Celebrex [Celecoxib] Other (See Comments)    No relief from pain; ineffective  . Codeine Nausea And Vomiting  . Demerol [Meperidine] Nausea And Vomiting  . Diclofenac Sodium Other (See Comments)    severe headache  . Fentanyl Nausea And Vomiting and Other (See Comments)    IV Fentanyl; caused nausea and vomiting Fentanyl patch; caused chest pain, HBP, with second patch experienced vertigo and nausea  . Fluoxetine Hcl Nausea Only and Other (See Comments)    dizziness, blurred vision, nausea, headache, insomnia  . Hydromorphone Hcl Nausea And Vomiting  . Ibuprofen Other (See Comments)    Caused ulcers and colitis  . Indomethacin Nausea Only and Other (See Comments)    dizziness, blurred vision, nausea, headache, insomnia  . Lisinopril Cough  . Meperidine Hcl Nausea And Vomiting  . Morphine Nausea And Vomiting  . Oruvail [Ketoprofen] Other (See Comments)    No relief from pain; ineffective  . Pregabalin Other (See Comments)    severe restless legs, insomnia  . Propranolol Hcl Nausea Only and Other (See Comments)    dizziness, blurred vision, nausea, headache, insomnia  . Refresh Lacri-Lube [Artificial Tears] Swelling    Caused swelling, redness, hard crust on eyelids   . Relafen [Nabumetone] Other (See Comments)    No relief from pain; ineffective  . Ropinirole Hcl Other (See Comments)    severe restless legs and insomnia  . Sulfamethoxazole Nausea Only and Other (See Comments)    gas, loose stools  . Bacitracin Hives, Itching and Rash    redness    Family History  Problem  Relation Age of Onset  . Colon cancer Maternal Grandmother   . Arthritis Mother   . Hypertension Mother   . Ulcers Mother   . Heart failure Father   . Pulmonary embolism       Prior to Admission medications   Medication Sig Start Date End Date Taking? Authorizing Provider  amLODipine (NORVASC) 2.5 MG tablet Take 1 tablet (2.5 mg total) by mouth daily. 10/03/14   [name], MD  aspirin EC 81 MG tablet Take 81 mg by mouth every evening.    Historical Provider, MD  atorvastatin (LIPITOR) 40 MG tablet TAKE 1 TABLET DAILY 07/12/14   [name], MD  Biotin 5000 MCG TABS Take 5,000 mcg by mouth every morning.     Historical Provider, MD  Calcium-Magnesium 500-250 MG TABS Take 2 tablets by mouth  daily at 6 PM.  02/18/11   Tammy S Parrett, NP  carvedilol (COREG) 25 MG tablet Take 1 tablet (25 mg total) by mouth 2 (two) times daily with a meal. PATIENT NEEDS TO CONTACT OFFICE FOR ADDITIONAL REFILLS 08/23/14   Minus Breeding, MD  Cholecalciferol (VITAMIN D3) 2000 UNITS TABS Take 2,000 Units by mouth daily at 12 noon.     Historical Provider, MD  co-enzyme Q-10 50 MG capsule Take 50 mg by mouth daily at 6 PM.     Historical Provider, MD  donepezil (ARICEPT) 10 MG tablet Take 1 tablet (10 mg total) by mouth at bedtime. 01/27/15   [name], MD  Evening Primrose Oil 500 MG CAPS Take 500 mg by mouth 2 (two) times daily.     Historical Provider, MD  fidaxomicin (DIFICID) 200 MG TABS tablet Take 1 tablet (200 mg total) by mouth 2 (two) times daily. 01/16/15   Manus Gunning, MD  fluticasone (FLONASE) 50 MCG/ACT nasal spray Place 2 sprays into both nostrils at bedtime.    Historical Provider, MD  guaifenesin (HUMIBID E) 400 MG TABS Take 400 mg by mouth 2 (two) times daily. Reported on 12/28/2014    Historical Provider, MD  ketotifen (THERA TEARS ALLERGY) 0.025 % ophthalmic solution Place 1 drop into both eyes daily as needed (dry eyes).    Historical Provider, MD  levothyroxine  (SYNTHROID, LEVOTHROID) 88 MCG tablet Take 1 tablet (88 mcg total) by mouth daily before breakfast. 07/19/14   [name], MD  lidocaine (LIDODERM) 5 % Place 1 patch onto the skin every 12 (twelve) hours as needed (back pain). Remove & Discard patch within 12 hours or as directed by MD    Historical Provider, MD  mesalamine (LIALDA) 1.2 G EC tablet Take 3.6 g by mouth daily with lunch.     Historical Provider, MD  Multiple Vitamins-Minerals (SUPER VITA-MINS PO) Take 1 tablet by mouth daily at 12 noon.     Historical Provider, MD  Omega 3 1000 MG CAPS Take 1,000 mg by mouth 2 (two) times daily.    Historical Provider, MD  pramipexole (MIRAPEX) 0.5 MG tablet TAKE 1 TABLET TWICE A DAY (NEED OFFICE VISIT) 12/05/14   [name], MD  PROBIOTIC CAPS Take 1 capsule by mouth daily. 02/18/11   Tammy S Parrett, NP  Simethicone (PHAZYME PO) Take 1-2 tablets by mouth 2 (two) times daily. Take 1 tablet at noon and 2 tablets at 4pm    Historical Provider, MD  traMADol (ULTRAM) 50 MG tablet Take 1 tablet (50 mg total) by mouth every 6 (six) hours as needed. Patient taking differently: Take 50 mg by mouth every 6 (six) hours as needed for moderate pain.  11/25/13   [name], MD  vitamin C (ASCORBIC ACID) 500 MG tablet Take 500 mg by mouth daily at 12 noon.     Historical Provider, MD   Physical Exam: Filed Vitals:   02/03/15 1630 02/03/15 1645 02/03/15 1700 02/03/15 1800  BP: 120/51 125/43 133/45   Pulse: 54 32    Temp:    97.5 F (36.4 C)  TempSrc:    Oral  Resp:  18 23   Height:    5' 6"  (1.676 m)  Weight:    75.8 kg (167 lb 1.7 oz)  SpO2: 97% 96% 97%      General:  Adult female, no acute distress  Eyes:  Her left eye has scar tissue which causes some asymmetry of  her epicanthal folds and difficulty with closing her left eye, anicteric, non-injected.  ENT:  Nares clear.  OP clear, non-erythematous without plaques or exudates.  MMM.  Neck:  Supple without TM or JVD.    Lymph:   No cervical, supraclavicular, or submandibular LAD.  Cardiovascular:  Irregular but no longer bradycardic, normal S1, S2, without m/r/g.  2+ pulses, warm extremities  Respiratory:  CTA bilaterally without increased WOB.  Abdomen:  NABS.  Soft, ND/NT.    Skin:  No rashes or focal lesions.  Musculoskeletal:  Normal bulk and tone.  No LE edema.  Psychiatric:  A & O to person. She asked if she was in the hospital. Not oriented to time.  Appropriate affect.  Neurologic:  CN 3-12 grossly intact.  5/5 strength.  Sensation intact to light touch.  Labs on Admission:  Basic Metabolic Panel:  Recent Labs Lab 02/03/15 1500 02/03/15 1524  NA 142 140  K 4.5 4.4  CL 104 104  CO2 26  --   GLUCOSE 147* 152*  BUN 15 18  CREATININE 0.91 0.80  CALCIUM 9.7  --    Liver Function Tests:  Recent Labs Lab 02/03/15 1500  AST 118*  ALT 127*  ALKPHOS 122  BILITOT 0.9  PROT 6.7  ALBUMIN 3.6   No results for input(s): LIPASE, AMYLASE in the last 168 hours. No results for input(s): AMMONIA in the last 168 hours. CBC:  Recent Labs Lab 02/03/15 1500 02/03/15 1524  WBC 7.5  --   NEUTROABS 5.2  --   HGB 14.1 15.6*  HCT 42.3 46.0  MCV 95.3  --   PLT 164  --    Cardiac Enzymes: No results for input(s): CKTOTAL, CKMB, CKMBINDEX, TROPONINI in the last 168 hours.  BNP (last 3 results)  Recent Labs  02/03/15 1518  BNP 109.2*    ProBNP (last 3 results) No results for input(s): PROBNP in the last 8760 hours.  CBG: No results for input(s): GLUCAP in the last 168 hours.  Radiological Exams on Admission: Dg Chest Portable 1 View  02/03/2015  CLINICAL DATA:  Syncope and bradycardia for 1 day EXAM: PORTABLE CHEST 1 VIEW COMPARISON:  September 28, 2014 FINDINGS: There is no edema or consolidation. Heart is upper normal in size with pulmonary vascularity within normal limits. No adenopathy. There is postoperative change in each shoulder and in the lower cervical spine region. IMPRESSION:  No edema or consolidation.  No change in cardiac silhouette. Electronically Signed   By: Lowella Grip III M.D.   On: 02/03/2015 15:12    EKG: Independently reviewed. Sinus bradycardia, rate 31  Assessment/Plan Principal Problem:   Syncope and collapse Active Problems:   Hypothyroidism   Hyperlipidemia   H/O Psychosis in past, hospitaluized in '09, in ED Nov 2015   Essential hypertension   CAD- minor CAD 2006   H/O A flutter, s/p RFA 2005   Senile dementia   C. difficile colitis   Sinus bradycardia   Syncope  ---  Syncope and collapse, likely secondary to vasovagal syncope after a bowel movement in the setting of beta blocker use. It is also possible that she had an accidental overdose of her beta blocker because she got confused. -  Cycle troponins -  Check TSH -  Echocardiogram -  Telemetry: It appears that her bradycardia is already improved -  Continue to hold all AV node blocking medications  Transaminitis -  Check acute hepatitis panel and acetaminophen level -  IV fluids  overnight -  Repeat CMP in a.m.  CAD, currently chest pain-free -  Continue aspirin and high-dose statin  Hypothyroidism  -  TSH elevated concerning for noncompliance -  Continue synthroid at current dose  Dementia, progressive.  Continue Aricept. Placed case management social work consultations to assist with healthcare proper turning paperwork and possible escalation to a higher level of care such as dementia unit at discharge along with her husband if possible.  Recent C. difficile colitis, diarrhea has resolved, continue for fidoxomicin  Diet:  Healthy heart Access:  PIV IVF:  Yes Proph:  Lovenox  Code Status: Full code Family Communication: Patient alone Disposition Plan: Admit to stepdown  Time spent: 60 min Ein Rijo, [facility] Pager [phone]  If 7PM-7AM, please contact night-coverage [email or web address] Password [facility] 02/03/2015, 6:11 PM

## 2015-02-03 NOTE — ED Notes (Signed)
Pt to ER via [facility] due to syncopal episode. Pt was standing in bedroom when husband noticed pt was getting weak and went over to her when suddenly she lost consciousness. Pt husband reports patient to have been out for 1 minute. Pt HR in 40's sinus brady on arrival to ER, EMS reports HR to have dropped as low as 30. Pt reports her normal HR to be above 60. Pt is a/o x4. BP maintaining well with last one being 110/38, 98% RA. Hx of HTN.

## 2015-02-04 ENCOUNTER — Inpatient Hospital Stay (HOSPITAL_COMMUNITY): Payer: Medicare Other

## 2015-02-04 DIAGNOSIS — R55 Syncope and collapse: Secondary | ICD-10-CM

## 2015-02-04 DIAGNOSIS — I10 Essential (primary) hypertension: Secondary | ICD-10-CM

## 2015-02-04 DIAGNOSIS — I483 Typical atrial flutter: Secondary | ICD-10-CM

## 2015-02-04 LAB — ACETAMINOPHEN LEVEL

## 2015-02-04 LAB — CBC
HEMATOCRIT: 38.6 % (ref 36.0–46.0)
HEMOGLOBIN: 12.9 g/dL (ref 12.0–15.0)
MCH: 31.9 pg (ref 26.0–34.0)
MCHC: 33.4 g/dL (ref 30.0–36.0)
MCV: 95.5 fL (ref 78.0–100.0)
Platelets: 149 10*3/uL — ABNORMAL LOW (ref 150–400)
RBC: 4.04 MIL/uL (ref 3.87–5.11)
RDW: 14.3 % (ref 11.5–15.5)
WBC: 7 10*3/uL (ref 4.0–10.5)

## 2015-02-04 LAB — BASIC METABOLIC PANEL
ANION GAP: 10 (ref 5–15)
BUN: 18 mg/dL (ref 6–20)
CHLORIDE: 109 mmol/L (ref 101–111)
CO2: 22 mmol/L (ref 22–32)
CREATININE: 0.78 mg/dL (ref 0.44–1.00)
Calcium: 8.9 mg/dL (ref 8.9–10.3)
GFR calc non Af Amer: >60 mL/min (ref >60)
Glucose, Bld: 104 mg/dL — ABNORMAL HIGH (ref 65–99)
POTASSIUM: 4.2 mmol/L (ref 3.5–5.1)
Sodium: 141 mmol/L (ref 135–145)

## 2015-02-04 LAB — HEPATIC FUNCTION PANEL
ALBUMIN: 3.4 g/dL — AB (ref 3.5–5.0)
ALT: 91 U/L — ABNORMAL HIGH (ref 14–54)
AST: 61 U/L — ABNORMAL HIGH (ref 15–41)
Alkaline Phosphatase: 98 U/L (ref 38–126)
Bilirubin, Direct: 0.2 mg/dL (ref 0.1–0.5)
Indirect Bilirubin: 0.9 mg/dL (ref 0.3–0.9)
TOTAL PROTEIN: 6.4 g/dL — AB (ref 6.5–8.1)
Total Bilirubin: 1.1 mg/dL (ref 0.3–1.2)

## 2015-02-04 LAB — TROPONIN I
Troponin I: <0.03 ng/mL (ref <0.031)
Troponin I: <0.03 ng/mL (ref <0.031)

## 2015-02-04 NOTE — Progress Notes (Signed)
       Patient Name: [name] Date of Encounter: 02/04/2015    SUBJECTIVE: There is been clinical improvement. Heart rate is now in the 60s. Beta blockers been held. No cardiac complaint.  TELEMETRY:  Sinus rhythm.: Filed Vitals:   02/04/15 0900 02/04/15 1000 02/04/15 1030 02/04/15 1100  BP: 127/40 146/59 144/49 136/50  Pulse: 62 58 63 60  Temp:      TempSrc:      Resp: 17 27 22 19   Height:      Weight:      SpO2: 96% 96% 97% 96%    Intake/Output Summary (Last 24 hours) at 02/04/15 1109 Last data filed at 02/04/15 0600  Gross per 24 hour  Intake    600 ml  Output      1 ml  Net    599 ml   LABS: Basic Metabolic Panel:  Recent Labs  02/03/15 1500 02/03/15 1524 02/03/15 1840 02/04/15 0559  NA 142 140  --  141  K 4.5 4.4  --  4.2  CL 104 104  --  109  CO2 26  --   --  22  GLUCOSE 147* 152*  --  104*  BUN 15 18  --  18  CREATININE 0.91 0.80  --  0.78  CALCIUM 9.7  --   --  8.9  MG  --   --  2.4  --    CBC:  Recent Labs  02/03/15 1500 02/03/15 1524 02/04/15 0559  WBC 7.5  --  7.0  NEUTROABS 5.2  --   --   HGB 14.1 15.6* 12.9  HCT 42.3 46.0 38.6  MCV 95.3  --  95.5  PLT 164  --  149*   Cardiac Enzymes:  Recent Labs  02/03/15 1840 02/04/15 0050 02/04/15 0559  TROPONINI <0.03 <0.03 <0.03   ECHOCARDIOGRAM: Performed but result is pending.  Radiology/Studies: No evidence of volume overload on the initial chest x-ray  Physical Exam: Blood pressure 136/50, pulse 60, temperature 98.1 F (36.7 C), temperature source Oral, resp. rate 19, height 5' 2"  (1.575 m), weight 167 lb 1.7 oz (75.8 kg), SpO2 96 %. Weight change:   Wt Readings from Last 3 Encounters:  02/03/15 167 lb 1.7 oz (75.8 kg)  01/27/15 167 lb 12.8 oz (76.114 kg)  12/30/14 155 lb 6.4 oz (70.489 kg)   Chest is clear No murmur rub click or gallop is heard on cardiac consultation Extremities reveal no edema  ASSESSMENT:  1. Bradycardia resolved after discontinuation of beta  blocker therapy and possibly resolution of vasovagal episode. 2. History of atrial flutter with radiofrequency ablation 2005 3. History of essential hypertension 4. Dementia  Plan:  We must now monitor for the possibility of rebound supraventricular arrhythmias. Will follow with you.   Demetrios Isaacs 02/04/2015, 11:09 AM

## 2015-02-04 NOTE — Progress Notes (Signed)
  Echocardiogram 2D Echocardiogram has been performed.  Johny Chess 02/04/2015, 11:06 AM

## 2015-02-04 NOTE — Progress Notes (Signed)
PROGRESS NOTE  LARAMIE GELLES YQM:[id number] DOB: 1932-07-04 DOA: 02/03/2015 PCP: [name], MD   HPI: 82 y.o. year-old female with history of Crohn's disease, recent C. difficile colitis, hypothyroidism, syncope, tuberculosis, bipolar affective disorder, hyperlipidemia, and progressive dementia who presents with syncope. She was found to be bradycardic in the ED and cardiology was consulted.   Subjective / 24 H Interval events - feeling better this morning - no chest pain, shortness of breath, no abdominal pain, nausea or vomiting.   Assessment/Plan: Principal Problem:   Syncope and collapse Active Problems:   Hypothyroidism   Hyperlipidemia   H/O Psychosis in past, hospitaluized in '09, in ED Nov 2015   Essential hypertension   CAD- minor CAD 2006   H/O A flutter, s/p RFA 2005   Senile dementia   C. difficile colitis   Sinus bradycardia   Syncope   Syncope and collapse in the setting of bradycardia - likely secondary to vasovagal syncope after a bowel movement in the setting of beta blocker use. It is also possible that she had an accidental overdose of her beta blocker because she got confused. - Continue to hold all AV node blocking medications  Transaminitis -Check acute hepatitis panel, pending - acetaminophen level low -Repeat CMP with improvement  CAD - currently chest pain-free -Continue aspirin and statin  Hypothyroidism  -Continue synthroid at current dose - TSH elevated questioning compliance, for some reason checked twice on admission, elevated at 11.7 and repeat @ 6.3. Given variability would recommend to recheck in 2-3 weeks  Dementia, progressive - Continue Aricept. Placed case management social work consultations to assist with healthcare proper turning paperwork and possible escalation to a higher level of care such as dementia unit at discharge along with her husband if possible. - PT consulted today  - discussed with patient this  morning, she denies having problems with her medications, uses a pill box and has "never" made mistakes in taking her meds.  - patient with poor memory and does not recall yesterday's events, husband bedside is able to provide a detailed story. He himself also endorses short term memory problems and "takes notes"  Recent C. difficile colitis - diarrhea has resolved, continue for fidoxomicin  Diet: Diet Heart Room service appropriate?: Yes; Fluid consistency:: Thin Fluids: none  DVT Prophylaxis: Lovenox  Code Status: Full Code Family Communication: d/w husband bedside  Disposition Plan: home vs SNF when ready. PT to eval  Barriers to discharge: bradycardia.   Consultants:  Cardiology   Procedures:  None    Antibiotics  Anti-infectives    Start     Dose/Rate Route Frequency Ordered Stop   02/03/15 2200  fidaxomicin (DIFICID) tablet 200 mg     200 mg Oral 2 times daily 02/03/15 1819         Studies  Dg Chest Portable 1 View  02/03/2015  CLINICAL DATA:  Syncope and bradycardia for 1 day EXAM: PORTABLE CHEST 1 VIEW COMPARISON:  September 28, 2014 FINDINGS: There is no edema or consolidation. Heart is upper normal in size with pulmonary vascularity within normal limits. No adenopathy. There is postoperative change in each shoulder and in the lower cervical spine region. IMPRESSION: No edema or consolidation.  No change in cardiac silhouette. Electronically Signed   By: Lowella Grip III M.D.   On: 02/03/2015 15:12    Objective  Filed Vitals:   02/04/15 0500 02/04/15 0600 02/04/15 0724 02/04/15 0800  BP: 137/55 145/50 155/57 148/45  Pulse: 54 64  65 62  Temp:   98.1 F (36.7 C)   TempSrc:   Oral   Resp: 18 24 20 19   Height:      Weight:      SpO2: 94% 97% 96% 94%    Intake/Output Summary (Last 24 hours) at 02/04/15 1019 Last data filed at 02/04/15 0600  Gross per 24 hour  Intake    600 ml  Output      1 ml  Net    599 ml   Filed Weights   02/03/15 1800    Weight: 75.8 kg (167 lb 1.7 oz)    Exam:  GENERAL: NAD  HEENT: no scleral icterus, PERRL  NECK: supple, no LAD  LUNGS: CTA biL, no wheezing  HEART: RRR without MRG  ABDOMEN: soft, non tender  EXTREMITIES: no clubbing / cyanosis  NEUROLOGIC: non focal  Data Reviewed: Basic Metabolic Panel:  Recent Labs Lab 02/03/15 1500 02/03/15 1524 02/03/15 1840 02/04/15 0559  NA 142 140  --  141  K 4.5 4.4  --  4.2  CL 104 104  --  109  CO2 26  --   --  22  GLUCOSE 147* 152*  --  104*  BUN 15 18  --  18  CREATININE 0.91 0.80  --  0.78  CALCIUM 9.7  --   --  8.9  MG  --   --  2.4  --    Liver Function Tests:  Recent Labs Lab 02/03/15 1500 02/04/15 0832  AST 118* 61*  ALT 127* 91*  ALKPHOS 122 98  BILITOT 0.9 1.1  PROT 6.7 6.4*  ALBUMIN 3.6 3.4*   CBC:  Recent Labs Lab 02/03/15 1500 02/03/15 1524 02/04/15 0559  WBC 7.5  --  7.0  NEUTROABS 5.2  --   --   HGB 14.1 15.6* 12.9  HCT 42.3 46.0 38.6  MCV 95.3  --  95.5  PLT 164  --  149*   Cardiac Enzymes:  Recent Labs Lab 02/03/15 1840 02/04/15 0050 02/04/15 0559  TROPONINI <0.03 <0.03 <0.03   BNP (last 3 results)  Recent Labs  02/03/15 1518  BNP 109.2*    Recent Results (from the past 240 hour(s))  MRSA PCR Screening     Status: None   Collection Time: 02/03/15  5:55 PM  Result Value Ref Range Status   MRSA by PCR NEGATIVE NEGATIVE Final    Comment:        The GeneXpert MRSA Assay (FDA approved for NASAL specimens only), is one component of a comprehensive MRSA colonization surveillance program. It is not intended to diagnose MRSA infection nor to guide or monitor treatment for MRSA infections.      Scheduled Meds: . aspirin EC  81 mg Oral QPM  . atorvastatin  40 mg Oral Daily  . donepezil  10 mg Oral QHS  . enoxaparin (LOVENOX) injection  40 mg Subcutaneous Q24H  . fidaxomicin  200 mg Oral BID  . levothyroxine  88 mcg Oral QAC breakfast  . mesalamine  3.6 g Oral Q lunch  .  sodium chloride flush  3 mL Intravenous Q12H   Continuous Infusions:    Marzetta Board, MD [facility] Pager [phone]. If 7 PM - 7 AM, please contact night-coverage at [email or web address], password [facility] 02/04/2015, 10:19 AM  LOS: 1 day

## 2015-02-05 DIAGNOSIS — R001 Bradycardia, unspecified: Secondary | ICD-10-CM | POA: Insufficient documentation

## 2015-02-05 DIAGNOSIS — I2510 Atherosclerotic heart disease of native coronary artery without angina pectoris: Secondary | ICD-10-CM

## 2015-02-05 LAB — BASIC METABOLIC PANEL
Anion gap: 11 (ref 5–15)
BUN: 15 mg/dL (ref 6–20)
CALCIUM: 8.7 mg/dL — AB (ref 8.9–10.3)
CO2: 25 mmol/L (ref 22–32)
CREATININE: 0.74 mg/dL (ref 0.44–1.00)
Chloride: 103 mmol/L (ref 101–111)
GFR calc Af Amer: >60 mL/min (ref >60)
GLUCOSE: 100 mg/dL — AB (ref 65–99)
Potassium: 3.8 mmol/L (ref 3.5–5.1)
SODIUM: 139 mmol/L (ref 135–145)

## 2015-02-05 LAB — HEPATITIS PANEL, ACUTE
HEP A IGM: NEGATIVE
HEP B C IGM: NEGATIVE
HEP B S AG: NEGATIVE

## 2015-02-05 NOTE — Progress Notes (Signed)
Utilization review completed.  

## 2015-02-05 NOTE — Progress Notes (Signed)
       Patient Name: [name] Date of Encounter: 02/05/2015    SUBJECTIVE: Unable to have coherent conversation. Quiet night from rhythm and hemodynamic standpoint.  TELEMETRY:  Sinus bradycardia with PACs, blocked PACs, and no evidence of AV block. Filed Vitals:   02/05/15 0000 02/05/15 0329 02/05/15 0400 02/05/15 0733  BP:  155/65 110/42 184/66  Pulse: 59 55 50 61  Temp:  98.5 F (36.9 C)  98.7 F (37.1 C)  TempSrc:  Oral  Oral  Resp: 23 15 18 18   Height:      Weight:      SpO2: 97% 97% 94% 95%    Intake/Output Summary (Last 24 hours) at 02/05/15 0944 Last data filed at 02/04/15 1900  Gross per 24 hour  Intake    120 ml  Output      0 ml  Net    120 ml   LABS: Basic Metabolic Panel:  Recent Labs  02/03/15 1840 02/04/15 0559 02/05/15 0306  NA  --  141 139  K  --  4.2 3.8  CL  --  109 103  CO2  --  22 25  GLUCOSE  --  104* 100*  BUN  --  18 15  CREATININE  --  0.78 0.74  CALCIUM  --  8.9 8.7*  MG 2.4  --   --    CBC:  Recent Labs  02/03/15 1500 02/03/15 1524 02/04/15 0559  WBC 7.5  --  7.0  NEUTROABS 5.2  --   --   HGB 14.1 15.6* 12.9  HCT 42.3 46.0 38.6  MCV 95.3  --  95.5  PLT 164  --  149*   Cardiac Enzymes:  Recent Labs  02/03/15 1840 02/04/15 0050 02/04/15 0559  TROPONINI <0.03 <0.03 <0.03   Radiology/Studies:  No new data  Physical Exam: Blood pressure 184/66, pulse 61, temperature 98.7 F (37.1 C), temperature source Oral, resp. rate 18, height 5' 2"  (1.575 m), weight 167 lb 1.7 oz (75.8 kg), SpO2 95 %. Weight change:   Wt Readings from Last 3 Encounters:  02/03/15 167 lb 1.7 oz (75.8 kg)  01/27/15 167 lb 12.8 oz (76.114 kg)  12/30/14 155 lb 6.4 oz (70.489 kg)   1 of 6 apical systolic murmur Chest is clear. Patient is lying flat without dyspnea. Extremities reveal no edema.  ASSESSMENT:  1. Marked bradycardia, resolved after discontinuation of beta blocker therapy. Now 48 hours off beta blocker therapy without  significant rebound increase in heart rate, suggesting the possibility of sick sinus syndrome/sinus node dysfunction. Nothing to suggest that a pacemaker would be needed at this point, but we will continue to follow. 2. Hypertension may become more of an issue off of beta blocker therapy. If needed we'll use amlodipine or hydralazine for blood pressure control  Plan:  1. Continue to monitor 2. Can move out of unit 3. If need for blood pressure control increases, consider hydralazine or amlodipine.  Demetrios Isaacs 02/05/2015, 9:44 AM

## 2015-02-05 NOTE — Progress Notes (Signed)
PROGRESS NOTE  [name]:[id number] DOB: 02/03/1932 DOA: 02/03/2015 PCP: [name], MD  HPI: 82 y.o. year-old female with history of Crohn's disease, recent C. difficile colitis, hypothyroidism, syncope, tuberculosis, bipolar affective disorder, hyperlipidemia, and progressive dementia who presents with syncope. She was found to be bradycardic in the ED and cardiology was consulted.   Subjective / 24 H Interval events - she has no recollection as to why she is here or what happened.  - she stated earlier to RN that no doctor has seen her  Assessment/Plan: Principal Problem:   Syncope and collapse Active Problems:   Hypothyroidism   Hyperlipidemia   H/O Psychosis in past, hospitaluized in '09, in ED Nov 2015   Essential hypertension   CAD- minor CAD 2006   H/O A flutter, s/p RFA 2005   Senile dementia   C. difficile colitis   Sinus bradycardia   Syncope   Syncope and collapse in the setting of bradycardia - likely secondary to vasovagal syncope after a bowel movement in the setting of beta blocker use. It is also possible that she had an accidental overdose of her beta blocker because she got confused. - Continue to hold all AV node blocking medications - stable on telemetry off BB and no rebound tachycardia  Dementia, progressive - Continue Aricept. Discussed today with patient and husband, they are in agreement that patient, due to dementia may not be safe for home. Will consult SW for SNF placement. PT to evaluate as well. - it is clear that patient herself doesn't have capacity to make medical decisions but I cannot make an official assessment of her husband. He appears clear to me and has detailed recollection of the events that let to his wife hospitalization.  Transaminitis -acute hepatitis panel negative - acetaminophen level low -Repeat CMP with improvement  CAD - currently chest pain-free -Continue aspirin and statin  Hypothyroidism    -Continue synthroid at current dose - TSH elevated questioning compliance, for some reason checked twice on admission, elevated at 11.7 and repeat @ 6.3. Given variability would recommend to recheck in 2-3 weeks. If she goes to SNF there will be certainty that she is taking the synthroid  Recent C. difficile colitis - diarrhea has resolved, continue for fidoxomicin   Diet: Diet Heart Room service appropriate?: Yes; Fluid consistency:: Thin Fluids: none  DVT Prophylaxis: Lovenox  Code Status: Full Code Family Communication: d/w husband bedside  Disposition Plan: SNF when ready. PT to eval  Barriers to discharge: bradycardia  Consultants:  Cardiology   Procedures:  None    Antibiotics  Anti-infectives    Start     Dose/Rate Route Frequency Ordered Stop   02/03/15 2200  fidaxomicin (DIFICID) tablet 200 mg     200 mg Oral 2 times daily 02/03/15 1819         Studies  Dg Chest Portable 1 View  02/03/2015  CLINICAL DATA:  Syncope and bradycardia for 1 day EXAM: PORTABLE CHEST 1 VIEW COMPARISON:  September 28, 2014 FINDINGS: There is no edema or consolidation. Heart is upper normal in size with pulmonary vascularity within normal limits. No adenopathy. There is postoperative change in each shoulder and in the lower cervical spine region. IMPRESSION: No edema or consolidation.  No change in cardiac silhouette. Electronically Signed   By: Lowella Grip III M.D.   On: 02/03/2015 15:12    Objective  Filed Vitals:   02/05/15 0000 02/05/15 0329 02/05/15 0400 02/05/15 0733  BP:  155/65 110/42 184/66  Pulse: 59 55 50 61  Temp:  98.5 F (36.9 C)  98.7 F (37.1 C)  TempSrc:  Oral  Oral  Resp: 23 15 18 18   Height:      Weight:      SpO2: 97% 97% 94% 95%    Intake/Output Summary (Last 24 hours) at 02/05/15 0931 Last data filed at 02/04/15 1900  Gross per 24 hour  Intake    120 ml  Output      0 ml  Net    120 ml   Filed Weights   02/03/15 1800  Weight: 75.8 kg  (167 lb 1.7 oz)   Exam:  GENERAL: NAD  HEENT: no scleral icterus, PERRL  NECK: supple, no LAD  LUNGS: CTA biL, no wheezing  HEART: RRR without MRG  ABDOMEN: soft, non tender  EXTREMITIES: no clubbing / cyanosis  NEUROLOGIC: non focal  Data Reviewed: Basic Metabolic Panel:  Recent Labs Lab 02/03/15 1500 02/03/15 1524 02/03/15 1840 02/04/15 0559 02/05/15 0306  NA 142 140  --  141 139  K 4.5 4.4  --  4.2 3.8  CL 104 104  --  109 103  CO2 26  --   --  22 25  GLUCOSE 147* 152*  --  104* 100*  BUN 15 18  --  18 15  CREATININE 0.91 0.80  --  0.78 0.74  CALCIUM 9.7  --   --  8.9 8.7*  MG  --   --  2.4  --   --    Liver Function Tests:  Recent Labs Lab 02/03/15 1500 02/04/15 0832  AST 118* 61*  ALT 127* 91*  ALKPHOS 122 98  BILITOT 0.9 1.1  PROT 6.7 6.4*  ALBUMIN 3.6 3.4*   CBC:  Recent Labs Lab 02/03/15 1500 02/03/15 1524 02/04/15 0559  WBC 7.5  --  7.0  NEUTROABS 5.2  --   --   HGB 14.1 15.6* 12.9  HCT 42.3 46.0 38.6  MCV 95.3  --  95.5  PLT 164  --  149*   Cardiac Enzymes:  Recent Labs Lab 02/03/15 1840 02/04/15 0050 02/04/15 0559  TROPONINI <0.03 <0.03 <0.03   BNP (last 3 results)  Recent Labs  02/03/15 1518  BNP 109.2*   Recent Results (from the past 240 hour(s))  MRSA PCR Screening     Status: None   Collection Time: 02/03/15  5:55 PM  Result Value Ref Range Status   MRSA by PCR NEGATIVE NEGATIVE Final    Comment:        The GeneXpert MRSA Assay (FDA approved for NASAL specimens only), is one component of a comprehensive MRSA colonization surveillance program. It is not intended to diagnose MRSA infection nor to guide or monitor treatment for MRSA infections.    Scheduled Meds: . aspirin EC  81 mg Oral QPM  . atorvastatin  40 mg Oral Daily  . donepezil  10 mg Oral QHS  . enoxaparin (LOVENOX) injection  40 mg Subcutaneous Q24H  . fidaxomicin  200 mg Oral BID  . levothyroxine  88 mcg Oral QAC breakfast  . mesalamine   3.6 g Oral Q lunch  . sodium chloride flush  3 mL Intravenous Q12H   Continuous Infusions:   Time spent: 25 minutes, > 50% bedside discussing with patient and her husband  Marzetta Board, MD [facility] Pager [phone]. If 7 PM - 7 AM, please contact night-coverage at [email or web address], password [facility] 02/05/2015, 9:31 AM  LOS: 2 days

## 2015-02-05 NOTE — Evaluation (Signed)
Physical Therapy Evaluation Patient Details Name: [name] MRN: [id number] DOB: 09-May-1932 Today's Date: 02/05/2015   History of Present Illness  82 y.o. year-old female with history of Crohn's disease, recent C. difficile colitis, hypothyroidism, syncope, tuberculosis, bipolar affective disorder, hyperlipidemia, and progressive dementia who presents with syncope. She was found to be bradycardic in the ED and cardiology was consulted.   Felt to have taken too much beta blocker. lives with husband who also appears to have dementia.   Clinical Impression  Pt admitted with above diagnosis. Pt currently with functional limitations due to the deficits listed below (see PT Problem List). Pt able to get up and get to 3N1 and take a few steps as well as perform some standing exercises. Feel that pt will be able to go home with husband's assist however daughter aware that pt and husband have difficulty with meds therefore she is in contact to get them help for this at home.  Recommend HHPT and HHOT to improve pts function back to her baseline as well as aide/sitter to assist them with medications and other items that they may need. Will follow acutely.   Pt will benefit from skilled PT to increase their independence and safety with mobility to allow discharge to the venue listed below.    Follow Up Recommendations Home health PT;Supervision/Assistance - 24 hour (HHOT, HHAide, HHsitter)    Equipment Recommendations  None recommended by PT    Recommendations for Other Services       Precautions / Restrictions Precautions Precautions: Fall Restrictions Weight Bearing Restrictions: No      Mobility  Bed Mobility Overal bed mobility: Independent                Transfers Overall transfer level: Needs assistance Equipment used: 2 person hand held assist Transfers: Sit to/from [facility] Sit to Stand: Min assist Stand pivot transfers: Min assist       General transfer  comment: Pt slightly unsteady on feet with sit to stand and pivot to 3N1 as pt had to urinate. Pt was able to wipe herself once finished on toilet.   Ambulation/Gait Ambulation/Gait assistance: Min assist Ambulation Distance (Feet): 5 Feet Assistive device: 2 person hand held assist Gait Pattern/deviations: Step-to pattern;Decreased step length - right;Decreased step length - left;Decreased stride length;Ataxic;Leaning posteriorly;Staggering left;Staggering right;Wide base of support   Gait velocity interpretation: Below normal speed for age/gender General Gait Details: Pt able to take a few steps to chair with pt unsteady on her feet. Needed bil UE support. Talked with husband who states he can assist and is always with her with her RW.   Stairs            Wheelchair Mobility    Modified Rankin (Stroke Patients Only)       Balance Overall balance assessment: Needs assistance Sitting-balance support: No upper extremity supported;Feet supported Sitting balance-Leahy Scale: Fair   Postural control: Posterior lean Standing balance support: Bilateral upper extremity supported;During functional activity Standing balance-Leahy Scale: Poor Standing balance comment: relies on UE support for standing balance as pt slight posterior lean.  Will need assist on d/c and RW use.                               Pertinent Vitals/Pain Pain Assessment: No/denies pain  VSS    Home Living Family/patient expects to be discharged to:: Private residence Living Arrangements: Spouse/significant other Available Help at Discharge:  Family;Available 24 hours/day Type of Home: Independent living facility Home Access: Level entry     Home Layout: One level Home Equipment: Walker - 4 wheels;Bedside commode;Shower seat (pt has 2- 4wheeled walkers-uses one outdoors/1 indoors) Additional Comments: Pt and husband live in I living facility     Prior Function Level of Independence: Independent  with assistive device(s)         Comments: Pt and husband always together.     Hand Dominance   Dominant Hand: Right    Extremity/Trunk Assessment   Upper Extremity Assessment: Defer to OT evaluation           Lower Extremity Assessment: Generalized weakness      Cervical / Trunk Assessment: Kyphotic  Communication   Communication: No difficulties  Cognition Arousal/Alertness: Awake/alert Behavior During Therapy: Flat affect Overall Cognitive Status: History of cognitive impairments - at baseline       Memory: Decreased short-term memory              General Comments      Exercises General Exercises - Lower Extremity Ankle Circles/Pumps: AROM;Both;10 reps;Seated Long Arc Quad: AROM;Both;10 reps;Seated Hip Flexion/Marching: AROM;Both;10 reps;Standing Toe Raises: AROM;Both;10 reps;Standing Mini-Sqauts: AROM;Both;10 reps;Standing      Assessment/Plan    PT Assessment Patient needs continued PT services  PT Diagnosis Generalized weakness   PT Problem List Decreased activity tolerance;Decreased balance;Decreased mobility;Decreased knowledge of use of DME;Decreased safety awareness;Decreased knowledge of precautions;Decreased cognition  PT Treatment Interventions DME instruction;Gait training;Functional mobility training;Therapeutic activities;Therapeutic exercise;Balance training;Cognitive remediation;Patient/family education   PT Goals (Current goals can be found in the Care Plan section) Acute Rehab PT Goals Patient Stated Goal: to go home PT Goal Formulation: With patient/family Time For Goal Achievement: 02/19/15 Potential to Achieve Goals: Good    Frequency Min 3X/week   Barriers to discharge  (husband with dementia as well)      Co-evaluation               End of Session Equipment Utilized During Treatment: Gait belt Activity Tolerance: Patient limited by fatigue Patient left: in chair;with call bell/phone within reach;with chair  alarm set;with family/visitor present Nurse Communication: Mobility status         Time: 1200-1215 PT Time Calculation (min) (ACUTE ONLY): 15 min   Charges:   PT Evaluation $PT Eval Moderate Complexity: 1 Procedure     PT G CodesDenice Paradise 02-14-15, 1:48 PM Asianae Minkler [facility] Acute Rehabilitation [phone] (pager)

## 2015-02-06 DIAGNOSIS — E785 Hyperlipidemia, unspecified: Secondary | ICD-10-CM

## 2015-02-06 DIAGNOSIS — R7989 Other specified abnormal findings of blood chemistry: Secondary | ICD-10-CM

## 2015-02-06 LAB — BASIC METABOLIC PANEL
ANION GAP: 9 (ref 5–15)
BUN: 11 mg/dL (ref 6–20)
CALCIUM: 9.1 mg/dL (ref 8.9–10.3)
CHLORIDE: 105 mmol/L (ref 101–111)
CO2: 24 mmol/L (ref 22–32)
Creatinine, Ser: 0.76 mg/dL (ref 0.44–1.00)
GFR calc non Af Amer: >60 mL/min (ref >60)
GLUCOSE: 98 mg/dL (ref 65–99)
POTASSIUM: 4.6 mmol/L (ref 3.5–5.1)
Sodium: 138 mmol/L (ref 135–145)

## 2015-02-06 MED ORDER — AMLODIPINE BESYLATE 5 MG PO TABS
5.0000 mg | ORAL_TABLET | Freq: Every day | ORAL | Status: DC
  Administered 2015-02-06 – 2015-02-07 (×2): 5 mg via ORAL
  Filled 2015-02-06 (×2): qty 1

## 2015-02-06 MED ORDER — FIDAXOMICIN 200 MG PO TABS
200.0000 mg | ORAL_TABLET | Freq: Two times a day (BID) | ORAL | Status: DC
  Administered 2015-02-06 – 2015-02-07 (×3): 200 mg via ORAL
  Filled 2015-02-06 (×4): qty 1

## 2015-02-06 MED ORDER — TRAMADOL HCL 50 MG PO TABS: 50.0000 mg | ORAL_TABLET | Freq: Four times a day (QID) | ORAL | Status: DC | PRN

## 2015-02-06 NOTE — Progress Notes (Signed)
PROGRESS NOTE  [name] WHQ:[id number] DOB: 11-03-1932 DOA: 02/03/2015 PCP: [name], MD  HPI: 82 y.o. year-old female with history of Crohn's disease, recent C. difficile colitis, hypothyroidism, syncope, tuberculosis, bipolar affective disorder, hyperlipidemia, and progressive dementia who presents with syncope. She was found to be bradycardic in the ED and cardiology was consulted.   Subjective / 24 H Interval events - ongoing confusion - no complaints, wants to go home   Assessment/Plan: Principal Problem:   Syncope and collapse Active Problems:   Hypothyroidism   Hyperlipidemia   H/O Psychosis in past, hospitaluized in '09, in ED Nov 2015   Essential hypertension   CAD- minor CAD 2006   H/O A flutter, s/p RFA 2005   Senile dementia   C. difficile colitis   Sinus bradycardia   Syncope   Bradycardia   Syncope and collapse in the setting of bradycardia - likely secondary to vasovagal syncope after a bowel movement in the setting of beta blocker use. It is also possible that she had an accidental overdose of her beta blocker because she got confused. - stable on telemetry off BB and no rebound tachycardia - cardiology following, reinstating low dose betablockers today, monitor and consider discharge tomorrow if rate stable  Dementia, progressive - Continue Aricept. Discussed today with patient and husband, they are in agreement that patient, due to dementia may not be safe for home. Will consult SW for SNF placement. PT to evaluate as well. - it is clear that patient herself doesn't have capacity to make medical decisions but I cannot make an official assessment of her husband.   Transaminitis -acute hepatitis panel negative - acetaminophen level low -Repeat CMP with improvement - hold statin  CAD - currently chest pain-free -Continue aspirin and statin  Hypothyroidism  -Continue synthroid at current dose - TSH elevated questioning compliance,  for some reason checked twice on admission, elevated at 11.7 and repeat @ 6.3. Given variability would recommend to recheck in 2-3 weeks. If she goes to SNF there will be certainty that she is taking the synthroid  Recent C. difficile colitis - diarrhea has resolved, continue fidoxomicin. Not clear that she took this at home, will do a complete 10 day course   Diet: Diet Heart Room service appropriate?: Yes; Fluid consistency:: Thin Fluids: none  DVT Prophylaxis: Lovenox  Code Status: Full Code Family Communication: d/w husband bedside  Disposition Plan: SNF 1 day Barriers to discharge: bradycardia  Consultants:  Cardiology   Procedures:  None    Antibiotics  Anti-infectives    Start     Dose/Rate Route Frequency Ordered Stop   02/06/15 1000  fidaxomicin (DIFICID) tablet 200 mg     200 mg Oral 2 times daily 02/06/15 0907 02/12/15 2359   02/03/15 2200  fidaxomicin (DIFICID) tablet 200 mg  Status:  Discontinued     200 mg Oral 2 times daily 02/03/15 1819 02/06/15 0907       Studies  No results found.  Objective  Filed Vitals:   02/05/15 1430 02/05/15 2132 02/05/15 2147 02/06/15 0529  BP: 155/52 147/51  155/55  Pulse: 63 60  65  Temp:  98.1 F (36.7 C)  98.9 F (37.2 C)  TempSrc:  Oral Oral Oral  Resp: 20 18  18   Height:      Weight:      SpO2: 98% 99%  97%    Intake/Output Summary (Last 24 hours) at 02/06/15 1308 Last data filed at 02/06/15 0700  Gross per 24 hour  Intake    120 ml  Output      0 ml  Net    120 ml   Filed Weights   02/03/15 1800  Weight: 75.8 kg (167 lb 1.7 oz)   Exam:  GENERAL: NAD  HEENT: no scleral icterus, PERRL  NECK: supple, no LAD  LUNGS: CTA biL, no wheezing  HEART: RRR without MRG  ABDOMEN: soft, non tender  EXTREMITIES: no clubbing / cyanosis  NEUROLOGIC: non focal  Data Reviewed: Basic Metabolic Panel:  Recent Labs Lab 02/03/15 1500 02/03/15 1524 02/03/15 1840 02/04/15 0559 02/05/15 0306  02/06/15 0404  NA 142 140  --  141 139 138  K 4.5 4.4  --  4.2 3.8 4.6  CL 104 104  --  109 103 105  CO2 26  --   --  22 25 24   GLUCOSE 147* 152*  --  104* 100* 98  BUN 15 18  --  18 15 11   CREATININE 0.91 0.80  --  0.78 0.74 0.76  CALCIUM 9.7  --   --  8.9 8.7* 9.1  MG  --   --  2.4  --   --   --    Liver Function Tests:  Recent Labs Lab 02/03/15 1500 02/04/15 0832  AST 118* 61*  ALT 127* 91*  ALKPHOS 122 98  BILITOT 0.9 1.1  PROT 6.7 6.4*  ALBUMIN 3.6 3.4*   CBC:  Recent Labs Lab 02/03/15 1500 02/03/15 1524 02/04/15 0559  WBC 7.5  --  7.0  NEUTROABS 5.2  --   --   HGB 14.1 15.6* 12.9  HCT 42.3 46.0 38.6  MCV 95.3  --  95.5  PLT 164  --  149*   Cardiac Enzymes:  Recent Labs Lab 02/03/15 1840 02/04/15 0050 02/04/15 0559  TROPONINI <0.03 <0.03 <0.03   BNP (last 3 results)  Recent Labs  02/03/15 1518  BNP 109.2*   Recent Results (from the past 240 hour(s))  MRSA PCR Screening     Status: None   Collection Time: 02/03/15  5:55 PM  Result Value Ref Range Status   MRSA by PCR NEGATIVE NEGATIVE Final    Comment:        The GeneXpert MRSA Assay (FDA approved for NASAL specimens only), is one component of a comprehensive MRSA colonization surveillance program. It is not intended to diagnose MRSA infection nor to guide or monitor treatment for MRSA infections.    Scheduled Meds: . amLODipine  5 mg Oral Daily  . aspirin EC  81 mg Oral QPM  . atorvastatin  40 mg Oral Daily  . donepezil  10 mg Oral QHS  . enoxaparin (LOVENOX) injection  40 mg Subcutaneous Q24H  . fidaxomicin  200 mg Oral BID  . levothyroxine  88 mcg Oral QAC breakfast  . mesalamine  3.6 g Oral Q lunch  . sodium chloride flush  3 mL Intravenous Q12H   Continuous Infusions:   Time spent: 25 minutes, > 50% bedside discussing with patient and her husband  Marzetta Board, MD [facility] Pager [phone]. If 7 PM - 7 AM, please contact night-coverage at [email or web address],  password [facility] 02/06/2015, 1:08 PM  LOS: 3 days

## 2015-02-06 NOTE — Progress Notes (Signed)
Patient Name: [name] Date of Encounter: 02/06/2015     Principal Problem:   Syncope and collapse Active Problems:   Hypothyroidism   Hyperlipidemia   H/O Psychosis in past, hospitaluized in '09, in ED Nov 2015   Essential hypertension   CAD- minor CAD 2006   H/O A flutter, s/p RFA 2005   Senile dementia   C. difficile colitis   Sinus bradycardia   Syncope   Bradycardia    SUBJECTIVE  No complaints. Ready for discharge.   CURRENT MEDS . aspirin EC  81 mg Oral QPM  . atorvastatin  40 mg Oral Daily  . donepezil  10 mg Oral QHS  . enoxaparin (LOVENOX) injection  40 mg Subcutaneous Q24H  . fidaxomicin  200 mg Oral BID  . levothyroxine  88 mcg Oral QAC breakfast  . mesalamine  3.6 g Oral Q lunch  . sodium chloride flush  3 mL Intravenous Q12H    OBJECTIVE  Filed Vitals:   02/05/15 1430 02/05/15 2132 02/05/15 2147 02/06/15 0529  BP: 155/52 147/51  155/55  Pulse: 63 60  65  Temp:  98.1 F (36.7 C)  98.9 F (37.2 C)  TempSrc:  Oral Oral Oral  Resp: 20 18  18   Height:      Weight:      SpO2: 98% 99%  97%    Intake/Output Summary (Last 24 hours) at 02/06/15 1200 Last data filed at 02/06/15 0700  Gross per 24 hour  Intake    120 ml  Output      0 ml  Net    120 ml   Filed Weights   02/03/15 1800  Weight: 167 lb 1.7 oz (75.8 kg)    PHYSICAL EXAM  General: Pleasant, NAD. Neuro: Alert and oriented X 3. Moves all extremities spontaneously. Psych: Normal affect. HEENT:  Normal  Neck: Supple without bruits or JVD. Lungs:  Resp regular and unlabored, CTA. Heart: RRR no s3, s4, or murmurs. Abdomen: Soft, non-tender, non-distended, BS + x 4.  Extremities: No clubbing, cyanosis or edema. DP/PT/Radials 2+ and equal bilaterally.  Accessory Clinical Findings  CBC  Recent Labs  02/03/15 1500 02/03/15 1524 02/04/15 0559  WBC 7.5  --  7.0  NEUTROABS 5.2  --   --   HGB 14.1 15.6* 12.9  HCT 42.3 46.0 38.6  MCV 95.3  --  95.5  PLT 164  --  149*    Basic Metabolic Panel  Recent Labs  02/03/15 1840  02/05/15 0306 02/06/15 0404  NA  --   < > 139 138  K  --   < > 3.8 4.6  CL  --   < > 103 105  CO2  --   < > 25 24  GLUCOSE  --   < > 100* 98  BUN  --   < > 15 11  CREATININE  --   < > 0.74 0.76  CALCIUM  --   < > 8.7* 9.1  MG 2.4  --   --   --   < > = values in this interval not displayed. Liver Function Tests  Recent Labs  02/03/15 1500 02/04/15 0832  AST 118* 61*  ALT 127* 91*  ALKPHOS 122 98  BILITOT 0.9 1.1  PROT 6.7 6.4*  ALBUMIN 3.6 3.4*   No results for input(s): LIPASE, AMYLASE in the last 72 hours. Cardiac Enzymes  Recent Labs  02/03/15 1840 02/04/15 0050 02/04/15 0559  TROPONINI <0.03 <0.03 <  0.03    Thyroid Function Tests  Recent Labs  02/03/15 1840  TSH 6.294*    TELE  HR now in 25s. Some PACs and PVCs  Radiology/Studies  Dg Chest Portable 1 View  02/03/2015  CLINICAL DATA:  Syncope and bradycardia for 1 day EXAM: PORTABLE CHEST 1 VIEW COMPARISON:  September 28, 2014 FINDINGS: There is no edema or consolidation. Heart is upper normal in size with pulmonary vascularity within normal limits. No adenopathy. There is postoperative change in each shoulder and in the lower cervical spine region. IMPRESSION: No edema or consolidation.  No change in cardiac silhouette. Electronically Signed   By: Lowella Grip III M.D.   On: 02/03/2015 15:12    ASSESSMENT AND PLAN 82 y/o fermale patient with a history of somewhat difficult to control hypertension as well as a number of drug intolerances. She also has a history of atrial flutter s/p RFA 2005 without recurrence, HLD, and minor CAD at cath 2006 and Alzheimer's dementia who presented to the [facility] ED on 02/03/15 after a syncopal episode and HR found to be in 30-40s.   Syncope: no further episodes.   Bradycardia: resolved after discontinuation of beta blocker therapy (Coreg 105m BID). Now 48 hours off beta blocker therapy without significant rebound  increase in heart rate (HR in 70s), suggesting the possibility of sick sinus syndrome/sinus node dysfunction. Nothing to suggest that a pacemaker would be needed at this point, but we will continue to follow.  Hypertension: may become more of an issue off of beta blocker therapy (was on Coreg 279mBID). I will add amlodipine 5 mg for now. This can be titrated up as an outpatient if BPs elevated.   Elevated TSH: continue synthroid  Dispo: going to SNF. Not sure she needs long term cardiology follow up at this point. If she has more bradycardia or syncope/presyncope, it may be reasonable. Dr. KeClaiborne Billingso see.   SiJudy PimpleA-C  Pager [phone] seen and examined. Agree with assessment and plan. Bradycardia has resolved off carvedilol 25 mg bid for 48 hrs.  HR now in 75 -80 in sinus rhythm.  Concerned about potential reflex tachycardia.  For this reason I recommend re-instituting Coreg at 3.125 mg bid today, monitor and re-assess in am.  Amlodipine started with increased BP today at 15637ystolic, now off high dose coreg. Would keep today and plan dc back  to independent living tomorrow rather than today. Also, LFT's are increased, will hold atorvastatin.   ThTroy SineMD, FACaprock Hospital/30/2017 12:58 PM

## 2015-02-06 NOTE — Discharge Summary (Signed)
Physician Discharge Summary  [name] [id number] DOB: 1932-10-16 DOA: 02/03/2015  PCP: Rebekah Post, MD  Admit date: 02/03/2015 Discharge date: 02/07/2015  Time spent: > 45 minutes  Recommendations for Outpatient Follow-up:  1. Follow up with Dr. Elease Hayes as scheduled 2. Continue Fidaxomycin for 5 additional days 3. Discontinued Coreg on discharge  4. Home health PT, RN, SW  Discharge Diagnoses:  Principal Problem:   Syncope and collapse Active Problems:   Hypothyroidism   Hyperlipidemia   H/O Psychosis in past, hospitaluized in '09, in ED Nov 2015   Essential hypertension   CAD- minor CAD 2006   H/O A flutter, s/p RFA 2005   Senile dementia   C. difficile colitis   Sinus bradycardia   Syncope   Bradycardia  Discharge Condition: stable  Diet recommendation: regular  Filed Weights   02/03/15 1800  Weight: 75.8 kg (167 lb 1.7 oz)    History of present illness:  Per Dr. Sheran Fava,  The patient is a 82 y.o. year-old female with history of Crohn's disease, recent C. difficile colitis, hypothyroidism, syncope, tuberculosis, bipolar affective disorder, hyperlipidemia, and progressive dementia who presents with syncope. The patient lives with her husband who is also demented. There are several notes by the primary care doctor documenting concerns about safety and needing a healthcare power of attorney. When I asked her about her memory loss and her husband's dementia and her conversations with her primary care doctor, she had no recollection. The patient was unable to tell me the reason why she was in the hospital and there is no family present. According to the notes, she was having a bowel movement today and she had a loss of consciousness afterwards. She currently denies fevers, chills, nausea, shortness of breath, chest pain, dysuria.   In the emergency department, her vital signs were notable for bradycardia to the low 30s. Cardiology was consulted and  recommended discontinuing her beta blocker and monitoring her in a stepdown unit with low threshold for placing a temporary pacing wire if needed. She had no leukocytosis, evidence of pneumonia, and we are awaiting a urinalysis. Troponin was negative.   Hospital Course:  Syncope and collapse in the setting of bradycardia - likely secondary to vasovagal syncope after a bowel movement in the setting of beta blocker use. It is also possible that she had an accidental overdose of her beta blocker because she got confused and has underlying dementia. Cardiology was consulted and have followed patient while hospitalized. She was monitored off her Coreg for 48 hours, and due to concerns for rebound tachycardia, Coreg at a lower dose was reintroduced. This resulted again in mild asymptomatic bradycardia and it was decided to stop Coreg altogether. Discussed with Dr. Claiborne Billings from cardiology. He recommends outpatient monitor, I have called cardiology coordinator and this will be arranged.  Dementia, progressive- Continue Aricept. Patient with good mobility, per SW given high level of independence will not be able to qualify for SNF stay. She will be discharged back home with HHPT, RN and SW.  Transaminitis -acute hepatitis panel negative,  acetaminophen level low, repeat CMP with improvement CAD - currently chest pain-free, Continue aspirin and statin Hypothyroidism -Continue synthroid at current dose. TSH elevated questioning compliance, for some reason checked twice on admission, elevated at 11.7 and repeat @ 6.3 the next day. Given variability would recommend to recheck in 2-3 weeks. Recent C. difficile colitis - diarrhea has resolved, continue fidoxomicin. Not clear that she took this at home, will do  a complete 10 day course with 5 days remaining at the time of discharge.  Procedures:  None    Consultations:  Cardiology   Discharge Exam: Filed Vitals:   02/06/15 2136 02/07/15 0500 02/07/15 1037  02/07/15 1347  BP: 158/55 137/42 130/49 127/50  Pulse: 68 63  68  Temp: 98.8 F (37.1 C) 98 F (36.7 C)  98.8 F (37.1 C)  TempSrc: Oral Oral  Oral  Resp: 18 16  18   Height:      Weight:      SpO2: 95% 95%  95%    General: NAD Cardiovascular: RRR Respiratory: CTA biL  Discharge Instructions Activity:  As tolerated   Get Medicines reviewed and adjusted: Please take all your medications with you for your next visit with your Primary MD  Please request your Primary MD to go over all hospital tests and procedure/radiological results at the follow up, please ask your Primary MD to get all Hospital records sent to his/her office.  If you experience worsening of your admission symptoms, develop shortness of breath, life threatening emergency, suicidal or homicidal thoughts you must seek medical attention immediately by calling 911 or calling your MD immediately if symptoms less severe.  You must read complete instructions/literature along with all the possible adverse reactions/side effects for all the Medicines you take and that have been prescribed to you. Take any new Medicines after you have completely understood and accpet all the possible adverse reactions/side effects.   Do not drive when taking Pain medications.   Do not take more than prescribed Pain, Sleep and Anxiety Medications  Special Instructions: If you have smoked or chewed Tobacco in the last 2 yrs please stop smoking, stop any regular Alcohol and or any Recreational drug use.  Wear Seat belts while driving.  Please note  You were cared for by a hospitalist during your hospital stay. Once you are discharged, your primary care physician will handle any further medical issues. Please note that NO REFILLS for any discharge medications will be authorized once you are discharged, as it is imperative that you return to your primary care physician (or establish a relationship with a primary care physician if you do  not have one) for your aftercare needs so that they can reassess your need for medications and monitor your lab values.    Medication List    STOP taking these medications        carvedilol 25 MG tablet  Commonly known as:  COREG     loperamide 2 MG tablet  Commonly known as:  IMODIUM A-D     VIAGRA PO      TAKE these medications        amLODipine 2.5 MG tablet  Commonly known as:  NORVASC  Take 1 tablet (2.5 mg total) by mouth daily.     aspirin EC 81 MG tablet  Take 81 mg by mouth every evening.     atorvastatin 40 MG tablet  Commonly known as:  LIPITOR  TAKE 1 TABLET DAILY     Biotin 5000 MCG Tabs  Take 5,000 mcg by mouth every morning.     Calcium-Magnesium 500-250 MG Tabs  Take 2 tablets by mouth daily at 6 PM.     cholestyramine 4 g packet  Commonly known as:  QUESTRAN  Take 8 g by mouth daily. Mix and drink 2 packets every morning     co-enzyme Q-10 50 MG capsule  Take 50 mg by mouth daily at 6  PM.     donepezil 10 MG tablet  Commonly known as:  ARICEPT  Take 1 tablet (10 mg total) by mouth at bedtime.     Evening Primrose Oil 500 MG Caps  Take 500 mg by mouth 2 (two) times daily.     fidaxomicin 200 MG Tabs tablet  Commonly known as:  DIFICID  Take 1 tablet (200 mg total) by mouth 2 (two) times daily.     fluticasone 50 MCG/ACT nasal spray  Commonly known as:  FLONASE  Place 2 sprays into both nostrils at bedtime.     levothyroxine 88 MCG tablet  Commonly known as:  SYNTHROID, LEVOTHROID  Take 1 tablet (88 mcg total) by mouth daily before breakfast.     lidocaine 5 %  Commonly known as:  LIDODERM  Place 1 patch onto the skin every 12 (twelve) hours as needed (back pain). Remove & Discard patch within 12 hours or as directed by MD     mesalamine 1.2 g EC tablet  Commonly known as:  LIALDA  Take 3.6 g by mouth daily with lunch.     MUCUS RELIEF 400 MG Tabs tablet  Generic drug:  guaifenesin  Take 400 mg by mouth every 4 (four) hours as  needed (congestion).     Omega 3 1000 MG Caps  Take 1,000 mg by mouth 2 (two) times daily.     OVER THE COUNTER MEDICATION  Take 2 capsules by mouth at bedtime. Somne Pure Sleep Aide (OTC)     OVER THE COUNTER MEDICATION  Take 1 tablet by mouth every 4 (four) hours as needed (cough/ congestion). Multi - symptom cold and sinus - tylenol 325 mg, guaifenesin 200 mg, phenylephrine 5 mg     PHAZYME PO  Take 1-2 tablets by mouth 2 (two) times daily as needed (gas/bloatine).     pramipexole 0.5 MG tablet  Commonly known as:  MIRAPEX  TAKE 1 TABLET TWICE A DAY (NEED OFFICE VISIT)     Probiotic Caps  Take 1 capsule by mouth daily.     SUPER VITA-MINS PO  Take 1 tablet by mouth daily at 12 noon.     THERATEARS ALLERGY 0.025 % ophthalmic solution  Generic drug:  ketotifen  Place 1 drop into both eyes daily as needed (dry eyes).     traMADol 50 MG tablet  Commonly known as:  ULTRAM  Take 1 tablet (50 mg total) by mouth every 6 (six) hours as needed.     vitamin C 500 MG tablet  Commonly known as:  ASCORBIC ACID  Take 500 mg by mouth daily at 12 noon.     Vitamin D3 2000 units Tabs  Take 2,000 Units by mouth daily at 12 noon.       Follow-up Information    Follow up with Rebekah Post, MD. Schedule an appointment as soon as possible for a visit in 2 weeks.   Specialty:  Family Medicine   Contact information:   [place] [address] [phone]       The results of significant diagnostics from this hospitalization (including imaging, microbiology, ancillary and laboratory) are listed below for reference.    Significant Diagnostic Studies: Dg Chest Portable 1 View  02/03/2015  CLINICAL DATA:  Syncope and bradycardia for 1 day EXAM: PORTABLE CHEST 1 VIEW COMPARISON:  September 28, 2014 FINDINGS: There is no edema or consolidation. Heart is upper normal in size with pulmonary vascularity within normal limits. No adenopathy. There  is postoperative  change in each shoulder and in the lower cervical spine region. IMPRESSION: No edema or consolidation.  No change in cardiac silhouette. Electronically Signed   By: Lowella Grip III M.D.   On: 02/03/2015 15:12    Microbiology: Recent Results (from the past 240 hour(s))  MRSA PCR Screening     Status: None   Collection Time: 02/03/15  5:55 PM  Result Value Ref Range Status   MRSA by PCR NEGATIVE NEGATIVE Final    Comment:        The GeneXpert MRSA Assay (FDA approved for NASAL specimens only), is one component of a comprehensive MRSA colonization surveillance program. It is not intended to diagnose MRSA infection nor to guide or monitor treatment for MRSA infections.      Labs: Basic Metabolic Panel:  Recent Labs Lab 02/03/15 1500 02/03/15 1524 02/03/15 1840 02/04/15 0559 02/05/15 0306 02/06/15 0404 02/07/15 0631  NA 142 140  --  141 139 138 139  K 4.5 4.4  --  4.2 3.8 4.6 4.2  CL 104 104  --  109 103 105 102  CO2 26  --   --  22 25 24 23   GLUCOSE 147* 152*  --  104* 100* 98 100*  BUN 15 18  --  18 15 11 19   CREATININE 0.91 0.80  --  0.78 0.74 0.76 0.78  CALCIUM 9.7  --   --  8.9 8.7* 9.1 9.2  MG  --   --  2.4  --   --   --   --    Liver Function Tests:  Recent Labs Lab 02/03/15 1500 02/04/15 0832  AST 118* 61*  ALT 127* 91*  ALKPHOS 122 98  BILITOT 0.9 1.1  PROT 6.7 6.4*  ALBUMIN 3.6 3.4*   CBC:  Recent Labs Lab 02/03/15 1500 02/03/15 1524 02/04/15 0559  WBC 7.5  --  7.0  NEUTROABS 5.2  --   --   HGB 14.1 15.6* 12.9  HCT 42.3 46.0 38.6  MCV 95.3  --  95.5  PLT 164  --  149*   Cardiac Enzymes:  Recent Labs Lab 02/03/15 1840 02/04/15 0050 02/04/15 0559  TROPONINI <0.03 <0.03 <0.03   BNP: BNP (last 3 results)  Recent Labs  02/03/15 1518  BNP 109.2*    Signed:  Chalmers Iddings  [facility] 02/07/2015, 3:42 PM

## 2015-02-06 NOTE — Care Management Note (Signed)
Case Management Note  Patient Details  Name: [name] MRN: [id number] Date of Birth: 05-17-1932  Subjective/Objective:    Pt admitted with syncope and collapse                Action/Plan:  Pt is from [facility] with husband.  Recommendations have been placed for SNF, both pt and husband are in agreement with SNF placement. CSW actively pursing placement   Expected Discharge Date:                  Expected Discharge Plan:  [place] (Pt is from [place])  In-House Referral:  Clinical Social Work  Discharge planning Services  CM Consult  Post Acute Care Choice:    Choice offered to:     DME Arranged:    DME Agency:     HH Arranged:    [place] Agency:     Status of Service:  In process, will continue to follow  Medicare Important Message Given:  Yes Date Medicare IM Given:    Medicare IM give by:    Date Additional Medicare IM Given:    Additional Medicare Important Message give by:     If discussed at [place] of Stay Meetings, dates discussed:    Additional Comments:  Maryclare Labrador, RN 02/06/2015, 3:59 PM

## 2015-02-06 NOTE — Care Management Important Message (Signed)
Important Message  Patient Details  Name: [name] MRN: [id number] Date of Birth: 1932-03-24   Medicare Important Message Given:  Yes    Maryclare Labrador, RN 02/06/2015, 10:04 AM

## 2015-02-06 NOTE — NC FL2 (Signed)
[place] LEVEL OF CARE SCREENING TOOL     IDENTIFICATION  Patient Name: [name] Birthdate: Aug 31, 1932 Sex: female Admission Date (Current Location): 02/03/2015  [facility] and [place] Number:  Herbalist and Address:  [facility] [place], [place] [address] [place], [place] [address]      Provider Number: [id number]  Attending Physician Name and Address:  Caren Griffins, MD  Relative Name and Phone Number:       Current Level of Care: Hospital Recommended Level of Care: [place] Prior Approval Number:    Date Approved/Denied:   PASRR Number:    Discharge Plan: SNF    Current Diagnoses: Patient Active Problem List   Diagnosis Date Noted  . Bradycardia   . Sinus bradycardia 02/03/2015  . Syncope 02/03/2015  . C. difficile colitis 11/26/2014  . Allergic rhinitis 11/04/2013  . Insomnia 11/04/2013  . Senile dementia 10/22/2013  . C7 cervical fracture ([place]) 10/12/2013  . Obesity (BMI 30-39.9) 04/01/2013  . Osteoarthritis 12/16/2012  . Memory impairment 01/15/2012  . Low back pain 02/13/2011  . Low back strain 02/13/2011  . MERKEL CELL TUMOR 12/14/2009  . OTHER DISEASES OF LUNG NOT ELSEWHERE CLASSIFIED 12/14/2009  . RESTLESS LEG SYNDROME 10/16/2008  . H/O Psychosis in past, hospitaluized in '09, in ED Nov 2015 12/15/2007  . Regional enteritis ([place]) 12/15/2007  . CEREBROVASCULAR DISEASE 08/12/2007  . COLONIC POLYPS 04/29/2007  . Hypothyroidism 04/29/2007  . BRONCHITIS, RECURRENT 04/29/2007  . DIVERTICULOSIS OF COLON 04/29/2007  . IRRITABLE BOWEL SYNDROME 04/29/2007  . Syncope and collapse 04/28/2007  . OBESITY 01/29/2007  . CAD- minor CAD 2006 01/29/2007  . H/O A flutter, s/p RFA 2005 01/29/2007  . GASTROESOPHAGEAL REFLUX DISEASE 01/29/2007  . OSTEOPENIA 01/29/2007  . HEADACHE, MIXED 01/29/2007  . Hyperlipidemia 01/12/2007  . OBSTRUCTIVE SLEEP APNEA 01/12/2007  . Essential hypertension 01/12/2007  .  FIBROMYALGIA 01/12/2007  . HYPERPARATHYROIDISM, HX OF 10/21/2006    Orientation RESPIRATION BLADDER Height & Weight     Self, Time, Situation, Place (has dementia but oriented)  Normal Continent Weight: 167 lb 1.7 oz (75.8 kg) Height:  5\' 2"  (157.5 cm)  BEHAVIORAL SYMPTOMS/MOOD NEUROLOGICAL BOWEL NUTRITION STATUS      Continent Diet (cardiac)  AMBULATORY STATUS COMMUNICATION OF NEEDS Skin   Limited Assist Verbally Normal                       Personal Care Assistance Level of Assistance  Bathing, Dressing Bathing Assistance: Limited assistance   Dressing Assistance: Limited assistance     Functional Limitations Info             SPECIAL CARE FACTORS FREQUENCY  PT (By licensed PT)     PT Frequency: 5/wk              Contractures      Additional Factors Info  Code Status, Allergies, Isolation Precautions, Psychotropic Code Status Info: FULL Allergies Info: Prednisone, Carbamazepine, Celebrex, Codeine, Demerol, Diclofenac Sodium, Fentanyl, Fluoxetine Hcl, Hydromorphone Hcl, Ibuprofen, Indomethacin, Lisinopril, Meperidine Hcl, Morphine, [place], Pregabalin, Propranolol Hcl, Refresh Lacri-lube, Relafen, Ropinirole Hcl, Sulfamethoxazole, Bacitracin Psychotropic Info: aricept   Isolation Precautions Info: MRSA, Enteric     Current Medications (02/06/2015):  This is the current hospital active medication list Current Facility-Administered Medications  Medication Dose Route Frequency Provider Last Rate Last Dose  . acetaminophen (TYLENOL) tablet 650 mg  650 mg Oral Q6H PRN Rebekah Canterbury, MD   [phone]  mg at 02/05/15 1350   Or  . acetaminophen (TYLENOL) suppository 650 mg  650 mg Rectal Q6H PRN Rebekah Canterbury, MD      . aspirin EC tablet 81 mg  81 mg Oral QPM Rebekah Canterbury, MD   81 mg at 02/05/15 1900  . atorvastatin (LIPITOR) tablet 40 mg  40 mg Oral Daily Rebekah Canterbury, MD   40 mg at 02/05/15 1051  . donepezil (ARICEPT) tablet 10 mg  10 mg Oral QHS Rebekah Canterbury, MD   10 mg at 02/05/15 2331  . enoxaparin (LOVENOX) injection 40 mg  40 mg Subcutaneous Q24H Rebekah Canterbury, MD   40 mg at 02/05/15 2332  . fidaxomicin (DIFICID) tablet 200 mg  200 mg Oral BID Costin Karlyne Greenspan, MD      . ketotifen (ZADITOR) 0.025 % ophthalmic solution 1 drop  1 drop Both Eyes Daily PRN Rebekah Canterbury, MD      . levothyroxine (SYNTHROID, LEVOTHROID) tablet 88 mcg  88 mcg Oral QAC breakfast Rebekah Canterbury, MD   88 mcg at 02/06/15 0800  . mesalamine (LIALDA) EC tablet 3.6 g  3.6 g Oral Q lunch Rebekah Canterbury, MD   3.6 g at 02/05/15 1051  . ondansetron (ZOFRAN) tablet 4 mg  4 mg Oral Q6H PRN Rebekah Canterbury, MD       Or  . ondansetron (ZOFRAN) injection 4 mg  4 mg Intravenous Q6H PRN Rebekah Canterbury, MD      . polyethylene glycol (MIRALAX / GLYCOLAX) packet 17 g  17 g Oral Daily PRN Rebekah Canterbury, MD      . sodium chloride flush (NS) 0.9 % injection 3 mL  3 mL Intravenous Q12H Rebekah Canterbury, MD   3 mL at 02/05/15 2200  . sorbitol 70 % solution 30 mL  30 mL Oral Daily PRN Rebekah Canterbury, MD      . traMADol Veatrice [place]) tablet 50 mg  50 mg Oral Q6H PRN Rebekah Canterbury, MD         Discharge Medications: Please see discharge summary for a list of discharge medications.  Relevant Imaging Results:  Relevant Lab Results:   Additional Information SS#: [id number]  [place]ranford Mon, [place]

## 2015-02-06 NOTE — Progress Notes (Addendum)
2:30pm  DC no longer anticipated for today due to cardiology concerns- anticipate DC tomorrow  Pt and family updated- facility can still accept pt tomorrow- husband doing paperwork at facility this afternoon  CSW received call from [facility] [phone]) who is following pt case- CSW placed copy of release of information to APS in the chart and provided worker with requested information- she will continue to follow case  12pm CSW spoke with pt and pt husband at bedside concerning current home safety concerns.  Pt and pt wife understand MD recommendation for short term rehab and are agreeable to short term stay following admission.  Husband feels as if pt will be safe to return home following rehab and would be agreeable to home health services if still needed after SNF.  Pt and pt wife would like somewhere close to home and choose [facility]- they can accept pt today at 2pm.  Patient will discharge to [facility] Anticipated discharge date: 02/06/15 Family notified: pt husband at bedside Transportation by husband- plan to arrive at [facility] at around [place], [place] Social Worker [phone]

## 2015-02-06 NOTE — Progress Notes (Signed)
Physical Therapy Treatment Patient Details Name: [name] MRN: [id number] DOB: [birth year] Today's Date: 02/06/2015    History of Present Illness 82 y.o. year-old female with history of Crohn's disease, recent C. difficile colitis, hypothyroidism, syncope, tuberculosis, bipolar affective disorder, hyperlipidemia, and progressive dementia who presents with syncope. She was found to be bradycardic in the ED and cardiology was consulted.   Felt to have taken too much beta blocker. lives with husband who also appears to have dementia.     PT Comments    Patient progressing with mobility including endurance, safety and balance.  She seems improved cognitively as well conversing with her spouse about his progress in looking for place for them to live.  Will continue to follow acutely and feel will benefit from HHPT follow up at d/c.  Follow Up Recommendations  Home health PT;Supervision/Assistance - 24 hour     Equipment Recommendations  None recommended by PT    Recommendations for Other Services       Precautions / Restrictions Precautions Precautions: Fall    Mobility  Bed Mobility               General bed mobility comments: up in recliner  Transfers     Transfers: Sit to/from Stand Sit to Stand: Supervision         General transfer comment: pushed up with armrests, no physical assist  Ambulation/Gait Ambulation/Gait assistance: Supervision Ambulation Distance (Feet): 300 Feet Assistive device: Rolling walker (2 wheeled) Gait Pattern/deviations: Step-through pattern;Decreased stride length;Trunk flexed     General Gait Details: steady with RW for ambulation in hallway, no LOB noted with walker, tends to keep it far anterior and stays flexed despite cues for upright posture   Stairs            Wheelchair Mobility    Modified Rankin (Stroke Patients Only)       Balance Overall balance assessment: Needs assistance         Standing balance  support: No upper extremity supported Standing balance-Leahy Scale: Fair Standing balance comment: able to stand initially without walker, no LOB                    Cognition Arousal/Alertness: Awake/alert Behavior During Therapy: WFL for tasks assessed/performed Overall Cognitive Status: History of cognitive impairments - at baseline                      Exercises      General Comments General comments (skin integrity, edema, etc.): spouse in room, seems to be looking into ALF/ILF for them both      Pertinent Vitals/Pain Pain Assessment: No/denies pain    Home Living                      Prior Function            PT Goals (current goals can now be found in the care plan section) Progress towards PT goals: Progressing toward goals    Frequency  Min 3X/week    PT Plan Current plan remains appropriate    Co-evaluation             End of Session Equipment Utilized During Treatment: Gait belt Activity Tolerance: Patient tolerated treatment well Patient left: in chair;with call bell/phone within reach;with family/visitor present     Time: [id number] PT Time Calculation (min) (ACUTE ONLY): 20 min  Charges:  $Gait Training: 8-22 mins  G CodesReginia Naas 02/06/2015, 4:17 PM  Magda Kiel, [place] 02/06/2015

## 2015-02-06 NOTE — Progress Notes (Signed)
Please see sticky note concerning discharge. Daughter feels that parents will not be able to get to SNF without someone following them at discharge. Thanks. Pt resting with call bell within reach.  Will continue to monitor. Payton Emerald, RN

## 2015-02-07 DIAGNOSIS — I495 Sick sinus syndrome: Secondary | ICD-10-CM

## 2015-02-07 LAB — BASIC METABOLIC PANEL
ANION GAP: 14 (ref 5–15)
BUN: 19 mg/dL (ref 6–20)
CALCIUM: 9.2 mg/dL (ref 8.9–10.3)
CO2: 23 mmol/L (ref 22–32)
Chloride: 102 mmol/L (ref 101–111)
Creatinine, Ser: 0.78 mg/dL (ref 0.44–1.00)
GFR calc Af Amer: >60 mL/min (ref >60)
GLUCOSE: 100 mg/dL — AB (ref 65–99)
Potassium: 4.2 mmol/L (ref 3.5–5.1)
SODIUM: 139 mmol/L (ref 135–145)

## 2015-02-07 MED ORDER — TRAMADOL HCL 50 MG PO TABS: 50.0000 mg | ORAL_TABLET | Freq: Four times a day (QID) | ORAL | Status: DC | PRN

## 2015-02-07 MED ORDER — FIDAXOMICIN 200 MG PO TABS: 200.0000 mg | ORAL_TABLET | Freq: Two times a day (BID) | ORAL | Status: DC

## 2015-02-07 NOTE — Care Management Note (Addendum)
Case Management Note  Patient Details  Name: [name] MRN: [id number] Date of Birth: [birth year]  Subjective/Objective:    Pt admitted with syncope and collapse                Action/Plan:  Pt is from [facility] with husband.  Recommendations have been placed for SNF, both pt and husband are in agreement with SNF placement. CSW actively pursing placement   Expected Discharge Date:                  Expected Discharge Plan:  [place] (Pt is from [place])  In-House Referral:  Clinical Social Work  Ship broker  CM Consult  Post Acute Care Choice:    Choice offered to:  Patient  DME Arranged:    DME Agency:     HH Arranged:  RN, Social Work, PT [place] Agency:  [facility] [place]  Status of Service:  Completed, signed off  Medicare Important Message Given:  Yes Date Medicare IM Given:    Medicare IM give by:    Date Additional Medicare IM Given:    Additional Medicare Important Message give by:     If discussed at [place] of Stay Meetings, dates discussed:    Additional Comments: 02/07/2015 Pt chose [facility] for agency, CM contacted agency and referral accepted.  Pt will return back to [place].  CM contacted [facility] and was informed that family can chose [facility] agency of their choice.  Pt daughter at bedside, daughters plan to hire 24 hour private duty care once back at center  CM informed from [place] that pt is no longer appropriate for SNFattending aware and will order [facility].  CM will pursue [facility] for pt. CM spoke with daughter [name] via phone (contact that CSW utilized) ; CM informed [name] that she would leave choice of agency form in pts room.  CM spoke with pt about choice of agency and asked pt to review with husband and daughter when they arrive shortly.  CM will continue to follow up Maryclare Labrador, RN 02/07/2015, 4:02 PM

## 2015-02-07 NOTE — Progress Notes (Signed)
Patient Name: [name] Date of Encounter: 02/07/2015     Principal Problem:   Syncope and collapse Active Problems:   Sinus bradycardia   Essential hypertension   Senile dementia   C. difficile colitis   Hypothyroidism   Hyperlipidemia   H/O Psychosis in past, hospitaluized in '09, in ED Nov 2015   CAD- minor CAD 2006   H/O A flutter, s/p RFA 2005   Syncope   Bradycardia    SUBJECTIVE  No complaints.   CURRENT MEDS . amLODipine  5 mg Oral Daily  . aspirin EC  81 mg Oral QPM  . atorvastatin  40 mg Oral Daily  . donepezil  10 mg Oral QHS  . enoxaparin (LOVENOX) injection  40 mg Subcutaneous Q24H  . fidaxomicin  200 mg Oral BID  . levothyroxine  88 mcg Oral QAC breakfast  . mesalamine  3.6 g Oral Q lunch  . sodium chloride flush  3 mL Intravenous Q12H    OBJECTIVE  Filed Vitals:   02/06/15 2136 02/07/15 0500 02/07/15 1037 02/07/15 1347  BP: 158/55 137/42 130/49 127/50  Pulse: 68 63  68  Temp: 98.8 F (37.1 C) 98 F (36.7 C)  98.8 F (37.1 C)  TempSrc: Oral Oral  Oral  Resp: 18 16  18   Height:      Weight:      SpO2: 95% 95%  95%    Intake/Output Summary (Last 24 hours) at 02/07/15 1444 Last data filed at 02/07/15 1300  Gross per 24 hour  Intake    720 ml  Output      0 ml  Net    720 ml   Filed Weights   02/03/15 1800  Weight: 167 lb 1.7 oz (75.8 kg)    PHYSICAL EXAM  General: Pleasant, NAD. Neuro: Alert and oriented X 3. Moves all extremities spontaneously. Psych: Normal affect. HEENT:  Normal  Neck: Supple without bruits or JVD. Lungs:  Resp regular and unlabored, CTA. Heart: RRR no s3, s4, or murmurs. Abdomen: Soft, non-tender, non-distended, BS + x 4.  Extremities: No clubbing, cyanosis or edema. DP/PT/Radials 2+ and equal bilaterally.  Accessory Clinical Findings  CBC No results for input(s): WBC, NEUTROABS, HGB, HCT, MCV, PLT in the last 72 hours. Basic Metabolic Panel  Recent Labs  02/06/15 0404 02/07/15 0631  NA 138  139  K 4.6 4.2  CL 105 102  CO2 24 23  GLUCOSE 98 100*  BUN 11 19  CREATININE 0.76 0.78  CALCIUM 9.1 9.2   Liver Function Tests No results for input(s): AST, ALT, ALKPHOS, BILITOT, PROT, ALBUMIN in the last 72 hours. No results for input(s): LIPASE, AMYLASE in the last 72 hours. Cardiac Enzymes No results for input(s): CKTOTAL, CKMB, CKMBINDEX, TROPONINI in the last 72 hours.  Thyroid Function Tests No results for input(s): TSH, T4TOTAL, T3FREE, THYROIDAB in the last 72 hours.  Invalid input(s): FREET3  TELE  HR now in 80s now, down to 30 last night  Radiology/Studies  Dg Chest Portable 1 View  02/03/2015  CLINICAL DATA:  Syncope and bradycardia for 1 day EXAM: PORTABLE CHEST 1 VIEW COMPARISON:  September 28, 2014 FINDINGS: There is no edema or consolidation. Heart is upper normal in size with pulmonary vascularity within normal limits. No adenopathy. There is postoperative change in each shoulder and in the lower cervical spine region. IMPRESSION: No edema or consolidation.  No change in cardiac silhouette. Electronically Signed   By: Lowella Grip III M.D.  On: 02/03/2015 15:12    ASSESSMENT AND PLAN 82 y/o [place] patient with a history of somewhat difficult to control hypertension as well as a number of drug intolerances. She also has a history of atrial flutter s/p RFA 2005 without recurrence, HLD, and minor CAD at cath 2006 and Alzheimer's dementia who presented to the [facility] ED on 02/03/15 after a syncopal episode and HR found to be in 30-40s.   Syncope: no further episodes.   Bradycardia: resolved after discontinuation of beta blocker therapy (Coreg 34m BID). Now 48 hours off beta blocker therapy without significant rebound increase in heart rate (HR in 70s), suggesting the possibility of sick sinus syndrome/sinus node dysfunction. Nothing to suggest that a pacemaker would be needed at this point, but we will continue to follow.  Hypertension: may become more of an  issue off of beta blocker therapy (was on Coreg 252mBID). I will add amlodipine 5 mg for now. This can be titrated up as an outpatient if BPs elevated.   Elevated TSH: continue synthroid  Dispo:. Marland Kitchenhe did not tolerate low dose Coreg- will stop. Kept today secondary to slow HR overnight.   SiHenri MedalA-C   Patient seen and examined. Agree with assessment and plan. Pt feels well. Developed recurrent bradycardia with re-initiation of 3.125 coreg; now dc'd.  Suspect SSS with sinus node dysfunction.  HR now sinus at 78. OK to dc back to her  Her care facility. Recommend an outpatient monitor to further assess rhythm and cardiology f/. If recurrent tachydysrhymias may need future pacemaker to avert brady events with med Rx.    ThTroy SineMD, FAFranciscan Alliance Inc [facility]/31/2017 3:13 PM

## 2015-02-07 NOTE — Progress Notes (Signed)
CSW sent updated PT notes to [facility]- [facility] reviewed- pt no longer qualifies for SNF stay under Medicare.  CSW called daughter, [name], and explainedSharyn Lull expressed understanding and is ok with plan for pt and husband to go back to [place].  APS has contacted [name] and is working with her with next steps with guardianship.  CSW left message for APS worker informing of disposition change  CSW informed RNCM of change to home health and paged MD to inform of change  CSW signing off  Domenica Reamer, [place] Social Worker [phone]

## 2015-02-07 NOTE — Progress Notes (Signed)
Physical Therapy Treatment Patient Details Name: [name] MRN: [id number] DOB: 1932-09-12 Today's Date: 02/07/2015    History of Present Illness 82 y.o. year-old female with history of Crohn's disease, recent C. difficile colitis, hypothyroidism, syncope, tuberculosis, bipolar affective disorder, hyperlipidemia, and progressive dementia who presents with syncope. She was found to be bradycardic in the ED and cardiology was consulted.   Felt to have taken too much beta blocker. lives with husband who also appears to have dementia.     PT Comments    Patient progressing well towards PT goals. Ambulating supervision with RW for longer distances and Min guard for within room ambulation. Pt understands being a fall risk and importance of using RW. Motivated to ambulate. Tolerated there ex sitting in chair. Will continue to follow per current POC.   Follow Up Recommendations  Home health PT;Supervision/Assistance - 24 hour     Equipment Recommendations  None recommended by PT    Recommendations for Other Services       Precautions / Restrictions Precautions Precautions: Fall Restrictions Weight Bearing Restrictions: No    Mobility  Bed Mobility               General bed mobility comments: up in recliner  Transfers Overall transfer level: Needs assistance Equipment used: Rolling walker (2 wheeled);None Transfers: Sit to/from Stand Sit to Stand: Supervision         General transfer comment: pushed up with armrests, no physical assist. Stood from chair x2, from toilet x1.   Ambulation/Gait Ambulation/Gait assistance: Supervision Ambulation Distance (Feet): 300 Feet Assistive device: Rolling walker (2 wheeled) Gait Pattern/deviations: Step-through pattern;Decreased stride length;Trunk flexed   Gait velocity interpretation: Below normal speed for age/gender General Gait Details: Steady with RW, no LOB noted. Ambulates within room without AD- slightly unsteady but no  LOB. Flexed despite cues for upright posture. 2 short standing rest breaks.   Stairs            Wheelchair Mobility    Modified Rankin (Stroke Patients Only)       Balance Overall balance assessment: Needs assistance Sitting-balance support: Feet supported;No upper extremity supported Sitting balance-Leahy Scale: Fair     Standing balance support: During functional activity Standing balance-Leahy Scale: Poor Standing balance comment: Able to perform grooming tasks standing at sink without LOB- brushing hair, teeth etc. No LOB.                    Cognition Arousal/Alertness: Awake/alert Behavior During Therapy: WFL for tasks assessed/performed Overall Cognitive Status: History of cognitive impairments - at baseline       Memory: Decreased short-term memory              Exercises General Exercises - Lower Extremity Ankle Circles/Pumps: Both;10 reps;Seated Long Arc Quad: Both;10 reps;Seated    General Comments General comments (skin integrity, edema, etc.): Spouse present in room.      Pertinent Vitals/Pain Pain Assessment: No/denies pain    Home Living                      Prior Function            PT Goals (current goals can now be found in the care plan section) Progress towards PT goals: Progressing toward goals    Frequency  Min 3X/week    PT Plan Current plan remains appropriate    Co-evaluation             End of  Session Equipment Utilized During Treatment: Gait belt Activity Tolerance: Patient tolerated treatment well Patient left: in chair;with call bell/phone within reach;with family/visitor present     Time: [id number] PT Time Calculation (min) (ACUTE ONLY): 20 min  Charges:  $Gait Training: 8-22 mins                    G Codes:      Alexes Lamarque A Veera Stapleton 02/07/2015, 11:14 AM Wray Kearns, [place], DPT [phone]

## 2015-02-08 ENCOUNTER — Telehealth: Payer: Self-pay

## 2015-02-08 ENCOUNTER — Ambulatory Visit: Payer: Medicare Other | Admitting: Family Medicine

## 2015-02-08 NOTE — Telephone Encounter (Signed)
Error

## 2015-02-08 NOTE — Progress Notes (Signed)
CSW faxed DC summary and RNCM note to [employer] at [place] who is continuing to follow patient case  Domenica Reamer, [place] Social Worker [phone]

## 2015-02-08 NOTE — Progress Notes (Signed)
Pt. Discharged to home  Pt. D/C'd via wheelchair with husband and daughter Discharge information reviewed and given All personal belongings given to Pt.  Education discussed and reviewed with patient and family IV was d/c and intact upon removal Tele d/c

## 2015-02-09 ENCOUNTER — Telehealth: Payer: Self-pay | Admitting: *Deleted

## 2015-02-09 NOTE — Telephone Encounter (Signed)
Transition Care Management Follow-up Telephone Call  How have you been since you were released from the hospital? Doing okay - information given by daughter   Do you understand why you were in the hospital? yes   Do you understand the discharge instrcutions? yes  Items Reviewed:  Medications reviewed: yes  Allergies reviewed: yes  Dietary changes reviewed: yes  Referrals reviewed: yes   Functional Questionnaire:   Activities of Daily Living (ADLs):   She states they are independent in the following: patient needs assistance States they require assistance with the following: patient needs assistance   Any transportation issues/concerns?: no   Any patient concerns? no   Confirmed importance and date/time of follow-up visits scheduled: yes   Confirmed with patient if condition begins to worsen call PCP or go to the ER.  Patient was given the Call-a-Nurse line [phone]: yes Patient was discharged 02/07/15 Patient was discharged to her home Patient has an appointment with Dr [name] 02/10/15

## 2015-02-10 ENCOUNTER — Ambulatory Visit (INDEPENDENT_AMBULATORY_CARE_PROVIDER_SITE_OTHER): Payer: Medicare Other | Admitting: Family Medicine

## 2015-02-10 ENCOUNTER — Telehealth: Payer: Self-pay | Admitting: Family Medicine

## 2015-02-10 VITALS — BP 120/70 | HR 76 | Temp 97.5°F | Ht 62.0 in | Wt 161.6 lb

## 2015-02-10 DIAGNOSIS — K5790 Diverticulosis of intestine, part unspecified, without perforation or abscess without bleeding: Secondary | ICD-10-CM | POA: Diagnosis not present

## 2015-02-10 DIAGNOSIS — M797 Fibromyalgia: Secondary | ICD-10-CM | POA: Diagnosis not present

## 2015-02-10 DIAGNOSIS — K589 Irritable bowel syndrome without diarrhea: Secondary | ICD-10-CM | POA: Diagnosis not present

## 2015-02-10 DIAGNOSIS — Z7982 Long term (current) use of aspirin: Secondary | ICD-10-CM | POA: Diagnosis not present

## 2015-02-10 DIAGNOSIS — K219 Gastro-esophageal reflux disease without esophagitis: Secondary | ICD-10-CM | POA: Diagnosis not present

## 2015-02-10 DIAGNOSIS — F319 Bipolar disorder, unspecified: Secondary | ICD-10-CM | POA: Diagnosis not present

## 2015-02-10 DIAGNOSIS — F0390 Unspecified dementia without behavioral disturbance: Secondary | ICD-10-CM | POA: Diagnosis not present

## 2015-02-10 DIAGNOSIS — R55 Syncope and collapse: Secondary | ICD-10-CM | POA: Diagnosis not present

## 2015-02-10 DIAGNOSIS — E669 Obesity, unspecified: Secondary | ICD-10-CM | POA: Diagnosis not present

## 2015-02-10 DIAGNOSIS — I2510 Atherosclerotic heart disease of native coronary artery without angina pectoris: Secondary | ICD-10-CM | POA: Diagnosis not present

## 2015-02-10 DIAGNOSIS — G4733 Obstructive sleep apnea (adult) (pediatric): Secondary | ICD-10-CM | POA: Diagnosis not present

## 2015-02-10 DIAGNOSIS — E785 Hyperlipidemia, unspecified: Secondary | ICD-10-CM | POA: Diagnosis not present

## 2015-02-10 DIAGNOSIS — I10 Essential (primary) hypertension: Secondary | ICD-10-CM | POA: Diagnosis not present

## 2015-02-10 DIAGNOSIS — Z79899 Other long term (current) drug therapy: Secondary | ICD-10-CM

## 2015-02-10 DIAGNOSIS — K5090 Crohn's disease, unspecified, without complications: Secondary | ICD-10-CM | POA: Diagnosis not present

## 2015-02-10 NOTE — Progress Notes (Signed)
Pre visit review using our clinic review tool, if applicable. No additional management support is needed unless otherwise documented below in the visit note. 

## 2015-02-10 NOTE — Progress Notes (Signed)
Subjective:    Patient ID: [name], female    DOB: 10/27/1932, 82 y.o.   MRN: [id number]  HPI Patient seen for follow-up regarding recent hospitalization for syncopal episode. Her chronic problems include history of dementia, hypothyroidism, hyperlipidemia, hypertension, CAD, history of atrial flutter, history of C. difficile colitis.  Admitted on January 27. She had syncopal episode with question of vasovagal. She was on carvedilol at the time of admission and was noted to be bradycardic with heart rate in the 30s. Beta blocker was discontinued during hospitalization. Troponin negative. She's not had any further dizziness or syncope since discharge. She has not had any rebound tachycardia to our knowledge since discharge.  She had mild transaminitis with acute hepatitis panel negative. Repeat CMP with improvement.  Hypothyroidism with TSH 6.3. Likely poor compliance with medications. She had recent C. difficile colitis and poor compliance with medicine and was eventually treated with Fidoxomicin.  We have struggled to find out if she is compliant with medications and we are in the process of getting stepped-up help for them. We have notified Adult Protective Services and already have someone coming to check with her medications daily to ensure compliance.  She is ambulating now with a walker. She's had diarrhea which has fully resolved.  Past Medical History  Diagnosis Date  . OSA (obstructive sleep apnea)   . History of bronchitis   . Other diseases of lung, not elsewhere classified   . Hypertension   . CAD (coronary artery disease)     LAD 30% 2006  . History of atrial flutter   . Cerebrovascular disease   . Hyperlipemia   . Obesity   . GERD (gastroesophageal reflux disease)   . Diverticulosis of colon   . IBS (irritable bowel syndrome)   . Colonic polyp   . Crohn's disease ([place])   . Hypothyroidism   . History of hyperparathyroidism   . Fibromyalgia   . Osteopenia   .  History of headache   . Syncope   . Restless legs syndrome (RLS)   . Psychosis   . History of tuberculosis   . Radiation 05/2008    5000 cGy to left lower eyelid, parotid lymphatics and upper neck  . Memory disturbance   . Bipolar affective ([place])     History of psychosis  . 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 C. difficile diarrhea    Past Surgical History  Procedure Laterality Date  . Parathyroid adenoma surgery    . Cholecystectomy    . Moh';s surg left lower eye lid surgery and lid reconstruction surgery  2/10  . Bladder suspension    . Cataract extraction Bilateral   . Appendectomy    . Tonsillectomy and adenoidectomy    . Lumbar laminectomy    . Incision and drainage perirectal abscess    . Heel spur excision    . Nasal sinus surgery Right     Right maxillary  . Eye lid surgery  04/04/2011    [facility] by Dr. Norlene Duel  . Posterior cervical fusion/foraminotomy N/A 10/12/2013    Procedure: Cervical five-Thoracic two cervico-thoracic posterior fusion;  Surgeon: [name], MD;  Location: [place] [facility];  Service: Neurosurgery;  Laterality: N/A;    reports that she quit smoking about 55 years ago. Her smoking use included Cigarettes. She quit after 6 years of use. She has never used smokeless tobacco. She reports that she does not drink alcohol or use illicit drugs.  family history includes Arthritis in her mother; Colon cancer in her maternal grandmother; Heart failure in her father; Hypertension in her mother; Ulcers in her mother. Allergies  Allergen Reactions  . Prednisone Other (See Comments)    Patient had psychiatric episode with hallucinations. Caused 3.5 week long hospitalization & loss of memory   . Carbamazepine Nausea Only and Other (See Comments)    Tegretol; dizziness, blurred vision, nausea, headache, insomnia  . Celebrex [Celecoxib] Other (See Comments)    No relief from pain; ineffective  . Codeine Nausea And Vomiting  . Demerol [Meperidine] Nausea And  Vomiting  . Diclofenac Sodium Other (See Comments)    severe headache  . Fentanyl Nausea And Vomiting and Other (See Comments)    IV Fentanyl; caused nausea and vomiting Fentanyl patch; caused chest pain, HBP, with second patch experienced vertigo and nausea  . Fluoxetine Hcl Nausea Only and Other (See Comments)    dizziness, blurred vision, nausea, headache, insomnia  . Hydromorphone Hcl Nausea And Vomiting  . Ibuprofen Other (See Comments)    Caused ulcers and colitis  . Indomethacin Nausea Only and Other (See Comments)    dizziness, blurred vision, nausea, headache, insomnia  . Lisinopril Cough  . Meperidine Hcl Nausea And Vomiting  . Morphine Nausea And Vomiting  . Oruvail [Ketoprofen] Other (See Comments)    No relief from pain; ineffective  . Pregabalin Other (See Comments)    severe restless legs, insomnia  . Propranolol Hcl Nausea Only and Other (See Comments)    dizziness, blurred vision, nausea, headache, insomnia  . Refresh Lacri-Lube [Artificial Tears] Swelling    Caused swelling, redness, hard crust on eyelids   . Relafen [Nabumetone] Other (See Comments)    No relief from pain; ineffective  . Ropinirole Hcl Other (See Comments)    severe restless legs and insomnia  . Sulfamethoxazole Nausea Only and Other (See Comments)    gas, loose stools  . Bacitracin Hives, Itching and Rash    redness      Review of Systems  Constitutional: Negative for appetite change, fatigue and unexpected weight change.  Eyes: Negative for visual disturbance.  Respiratory: Negative for cough, chest tightness, shortness of breath and wheezing.   Cardiovascular: Negative for chest pain, palpitations and leg swelling.  Gastrointestinal: Negative for abdominal pain.  Endocrine: Negative for polydipsia and polyuria.  Genitourinary: Negative for dysuria and hematuria.  Neurological: Negative for dizziness, seizures, syncope, weakness, light-headedness and headaches.  Hematological:  Negative for adenopathy.       Objective:   Physical Exam  Constitutional: She is oriented to person, place, and time. She appears well-developed and well-nourished.  Neck: Neck supple. No thyromegaly present.  Cardiovascular: Normal rate and regular rhythm.   Pulmonary/Chest: Effort normal and breath sounds normal. No respiratory distress. She has no wheezes. She has no rales.  Musculoskeletal: She exhibits no edema.  Neurological: She is alert and oriented to person, place, and time. No cranial nerve deficit.          Assessment & Plan:  #1 recent syncope probably related to bradycardia in the setting of vasovagal syncope and beta blocker use. Continue to keep off of carvedilol. Her heart rate today at rest is 76 and she has not had any recurrent dizziness or syncope  #2 advanced dementia. We've had significant concerns regarding safety with medication use. We have called Adult Protective Services and we already have someone in place for checking their medications daily.  We are anticipating that they  will need higher level of care very soon  #3 polypharmacy. We went through her list at length today and tried to eliminate any unnecessary medications.  A printed list of her medications was given to her caregiver.
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
## 2015-02-10 NOTE — Telephone Encounter (Signed)
Spoke to home health and I have faxed recent medication list to [place].

## 2015-02-12 DIAGNOSIS — F0390 Unspecified dementia without behavioral disturbance: Secondary | ICD-10-CM | POA: Diagnosis not present

## 2015-02-12 DIAGNOSIS — K589 Irritable bowel syndrome without diarrhea: Secondary | ICD-10-CM | POA: Diagnosis not present

## 2015-02-12 DIAGNOSIS — I10 Essential (primary) hypertension: Secondary | ICD-10-CM | POA: Diagnosis not present

## 2015-02-12 DIAGNOSIS — K5790 Diverticulosis of intestine, part unspecified, without perforation or abscess without bleeding: Secondary | ICD-10-CM | POA: Diagnosis not present

## 2015-02-12 DIAGNOSIS — I2510 Atherosclerotic heart disease of native coronary artery without angina pectoris: Secondary | ICD-10-CM | POA: Diagnosis not present

## 2015-02-12 DIAGNOSIS — K5090 Crohn's disease, unspecified, without complications: Secondary | ICD-10-CM | POA: Diagnosis not present

## 2015-02-13 ENCOUNTER — Telehealth: Payer: Self-pay | Admitting: Family Medicine

## 2015-02-13 DIAGNOSIS — R55 Syncope and collapse: Secondary | ICD-10-CM

## 2015-02-13 NOTE — Telephone Encounter (Signed)
Did not see in your recent hospital f/u plan that pt was to go to cardiology. Okay to set her up for a heart monitor for recent sx?

## 2015-02-13 NOTE — Telephone Encounter (Signed)
At d/c she was advised to see a cardiologist and also have a heart monitor. Daughter wanted to make sure that you knew and to see if you can help to initiate that process.

## 2015-02-13 NOTE — Telephone Encounter (Signed)
There was mention (per hospitalist in d/c note) of them contacting someone in cardiology for event monitoring but apparently this has not been done.  OK to set up cardiac event monitor.  Dx is "syncope"

## 2015-02-13 NOTE — Telephone Encounter (Signed)
See other note on this patient. Will try to contact daughter tomorrow.

## 2015-02-13 NOTE — Telephone Encounter (Signed)
Patient and husband came in-  Husband stated that the nurse came by to set up the program on Saturday, however he states they didn't give her medications to her while they were there.  They said that the nurse would be back on Sunday to administer the medication to the patient but never came back.  I spoke to Dr. [name] and he said to send a message back and have y;all to call and follow up with the nurse agency.

## 2015-02-14 ENCOUNTER — Telehealth: Payer: Self-pay | Admitting: Family Medicine

## 2015-02-14 DIAGNOSIS — K589 Irritable bowel syndrome without diarrhea: Secondary | ICD-10-CM | POA: Diagnosis not present

## 2015-02-14 DIAGNOSIS — I2510 Atherosclerotic heart disease of native coronary artery without angina pectoris: Secondary | ICD-10-CM | POA: Diagnosis not present

## 2015-02-14 DIAGNOSIS — K5790 Diverticulosis of intestine, part unspecified, without perforation or abscess without bleeding: Secondary | ICD-10-CM | POA: Diagnosis not present

## 2015-02-14 DIAGNOSIS — I10 Essential (primary) hypertension: Secondary | ICD-10-CM | POA: Diagnosis not present

## 2015-02-14 DIAGNOSIS — F0390 Unspecified dementia without behavioral disturbance: Secondary | ICD-10-CM | POA: Diagnosis not present

## 2015-02-14 DIAGNOSIS — K5090 Crohn's disease, unspecified, without complications: Secondary | ICD-10-CM | POA: Diagnosis not present

## 2015-02-14 MED ORDER — AMLODIPINE BESYLATE 2.5 MG PO TABS: 2.5000 mg | ORAL_TABLET | Freq: Every day | ORAL | Status: DC

## 2015-02-14 NOTE — Telephone Encounter (Signed)
Order has been entered for patient. [name] is aware via voicemail.

## 2015-02-14 NOTE — Telephone Encounter (Signed)
Spoke with [name] and medication has been sent into the pharmacy.

## 2015-02-14 NOTE — Telephone Encounter (Signed)
Aaron Edelman w/[facility] expressed that to her understanding, Norbasc should be taken by the patient, but she does not have any.  If this is in fact true, she will need a prescription for this medication.

## 2015-02-15 DIAGNOSIS — I2510 Atherosclerotic heart disease of native coronary artery without angina pectoris: Secondary | ICD-10-CM | POA: Diagnosis not present

## 2015-02-15 DIAGNOSIS — K589 Irritable bowel syndrome without diarrhea: Secondary | ICD-10-CM | POA: Diagnosis not present

## 2015-02-15 DIAGNOSIS — K5790 Diverticulosis of intestine, part unspecified, without perforation or abscess without bleeding: Secondary | ICD-10-CM | POA: Diagnosis not present

## 2015-02-15 DIAGNOSIS — F0390 Unspecified dementia without behavioral disturbance: Secondary | ICD-10-CM | POA: Diagnosis not present

## 2015-02-15 DIAGNOSIS — I10 Essential (primary) hypertension: Secondary | ICD-10-CM | POA: Diagnosis not present

## 2015-02-15 DIAGNOSIS — K5090 Crohn's disease, unspecified, without complications: Secondary | ICD-10-CM | POA: Diagnosis not present

## 2015-02-16 ENCOUNTER — Observation Stay (HOSPITAL_COMMUNITY)
Admission: EM | Admit: 2015-02-16 | Discharge: 2015-02-17 | Disposition: A | Discharge status: Home Health | Payer: Medicare Other | Attending: Internal Medicine | Admitting: Internal Medicine

## 2015-02-16 ENCOUNTER — Encounter (HOSPITAL_COMMUNITY): Payer: Self-pay | Admitting: Emergency Medicine

## 2015-02-16 ENCOUNTER — Other Ambulatory Visit: Payer: Self-pay | Admitting: Family Medicine

## 2015-02-16 DIAGNOSIS — G2581 Restless legs syndrome: Secondary | ICD-10-CM | POA: Diagnosis not present

## 2015-02-16 DIAGNOSIS — I679 Cerebrovascular disease, unspecified: Secondary | ICD-10-CM | POA: Diagnosis not present

## 2015-02-16 DIAGNOSIS — S12600A Unspecified displaced fracture of seventh cervical vertebra, initial encounter for closed fracture: Secondary | ICD-10-CM | POA: Diagnosis present

## 2015-02-16 DIAGNOSIS — F29 Unspecified psychosis not due to a substance or known physiological condition: Secondary | ICD-10-CM | POA: Diagnosis present

## 2015-02-16 DIAGNOSIS — Z7982 Long term (current) use of aspirin: Secondary | ICD-10-CM | POA: Insufficient documentation

## 2015-02-16 DIAGNOSIS — A047 Enterocolitis due to Clostridium difficile: Secondary | ICD-10-CM | POA: Diagnosis not present

## 2015-02-16 DIAGNOSIS — Z87891 Personal history of nicotine dependence: Secondary | ICD-10-CM | POA: Insufficient documentation

## 2015-02-16 DIAGNOSIS — F0390 Unspecified dementia without behavioral disturbance: Secondary | ICD-10-CM | POA: Insufficient documentation

## 2015-02-16 DIAGNOSIS — E785 Hyperlipidemia, unspecified: Secondary | ICD-10-CM | POA: Insufficient documentation

## 2015-02-16 DIAGNOSIS — I959 Hypotension, unspecified: Secondary | ICD-10-CM | POA: Diagnosis not present

## 2015-02-16 DIAGNOSIS — R001 Bradycardia, unspecified: Secondary | ICD-10-CM | POA: Diagnosis not present

## 2015-02-16 DIAGNOSIS — K219 Gastro-esophageal reflux disease without esophagitis: Secondary | ICD-10-CM | POA: Diagnosis not present

## 2015-02-16 DIAGNOSIS — F319 Bipolar disorder, unspecified: Secondary | ICD-10-CM | POA: Insufficient documentation

## 2015-02-16 DIAGNOSIS — R55 Syncope and collapse: Secondary | ICD-10-CM | POA: Diagnosis not present

## 2015-02-16 DIAGNOSIS — I10 Essential (primary) hypertension: Secondary | ICD-10-CM | POA: Diagnosis not present

## 2015-02-16 DIAGNOSIS — Z981 Arthrodesis status: Secondary | ICD-10-CM | POA: Insufficient documentation

## 2015-02-16 DIAGNOSIS — E039 Hypothyroidism, unspecified: Secondary | ICD-10-CM | POA: Diagnosis not present

## 2015-02-16 DIAGNOSIS — K589 Irritable bowel syndrome without diarrhea: Secondary | ICD-10-CM | POA: Diagnosis not present

## 2015-02-16 DIAGNOSIS — M797 Fibromyalgia: Secondary | ICD-10-CM | POA: Diagnosis not present

## 2015-02-16 DIAGNOSIS — Z79899 Other long term (current) drug therapy: Secondary | ICD-10-CM | POA: Diagnosis not present

## 2015-02-16 DIAGNOSIS — R404 Transient alteration of awareness: Secondary | ICD-10-CM | POA: Diagnosis not present

## 2015-02-16 DIAGNOSIS — A0472 Enterocolitis due to Clostridium difficile, not specified as recurrent: Secondary | ICD-10-CM | POA: Diagnosis present

## 2015-02-16 DIAGNOSIS — R413 Other amnesia: Secondary | ICD-10-CM

## 2015-02-16 DIAGNOSIS — Z6830 Body mass index (BMI) 30.0-30.9, adult: Secondary | ICD-10-CM | POA: Insufficient documentation

## 2015-02-16 DIAGNOSIS — I2510 Atherosclerotic heart disease of native coronary artery without angina pectoris: Secondary | ICD-10-CM | POA: Diagnosis not present

## 2015-02-16 DIAGNOSIS — Z7951 Long term (current) use of inhaled steroids: Secondary | ICD-10-CM | POA: Insufficient documentation

## 2015-02-16 DIAGNOSIS — G4733 Obstructive sleep apnea (adult) (pediatric): Secondary | ICD-10-CM | POA: Diagnosis not present

## 2015-02-16 LAB — CBC
HEMATOCRIT: 36.9 % (ref 36.0–46.0)
HEMOGLOBIN: 12.6 g/dL (ref 12.0–15.0)
MCH: 32.6 pg (ref 26.0–34.0)
MCHC: 34.1 g/dL (ref 30.0–36.0)
MCV: 95.3 fL (ref 78.0–100.0)
Platelets: 165 10*3/uL (ref 150–400)
RBC: 3.87 MIL/uL (ref 3.87–5.11)
RDW: 13.6 % (ref 11.5–15.5)
WBC: 6.6 10*3/uL (ref 4.0–10.5)

## 2015-02-16 LAB — BASIC METABOLIC PANEL
ANION GAP: 12 (ref 5–15)
BUN: 20 mg/dL (ref 6–20)
CHLORIDE: 103 mmol/L (ref 101–111)
CO2: 23 mmol/L (ref 22–32)
Calcium: 9.3 mg/dL (ref 8.9–10.3)
Creatinine, Ser: 0.87 mg/dL (ref 0.44–1.00)
GFR calc Af Amer: >60 mL/min (ref >60)
Glucose, Bld: 162 mg/dL — ABNORMAL HIGH (ref 65–99)
POTASSIUM: 4.3 mmol/L (ref 3.5–5.1)
SODIUM: 138 mmol/L (ref 135–145)

## 2015-02-16 LAB — CBG MONITORING, ED: Glucose-Capillary: 148 mg/dL — ABNORMAL HIGH (ref 65–99)

## 2015-02-16 MED ORDER — PRAMIPEXOLE DIHYDROCHLORIDE 0.25 MG PO TABS
0.5000 mg | ORAL_TABLET | Freq: Three times a day (TID) | ORAL | Status: DC
  Administered 2015-02-17 (×2): 0.5 mg via ORAL
  Filled 2015-02-16 (×2): qty 2

## 2015-02-16 MED ORDER — ACETAMINOPHEN 325 MG PO TABS
650.0000 mg | ORAL_TABLET | Freq: Four times a day (QID) | ORAL | Status: DC | PRN
  Administered 2015-02-17: 650 mg via ORAL
  Filled 2015-02-16: qty 2

## 2015-02-16 MED ORDER — ONDANSETRON HCL 4 MG PO TABS: 4.0000 mg | ORAL_TABLET | Freq: Four times a day (QID) | ORAL | Status: DC | PRN

## 2015-02-16 MED ORDER — SODIUM CHLORIDE 0.9% FLUSH: 3.0000 mL | Freq: Two times a day (BID) | INTRAVENOUS | Status: DC

## 2015-02-16 MED ORDER — ACETAMINOPHEN 650 MG RE SUPP: 650.0000 mg | Freq: Four times a day (QID) | RECTAL | Status: DC | PRN

## 2015-02-16 MED ORDER — ONDANSETRON HCL 4 MG/2ML IJ SOLN: 4.0000 mg | Freq: Four times a day (QID) | INTRAMUSCULAR | Status: DC | PRN

## 2015-02-16 MED ORDER — LEVOTHYROXINE SODIUM 100 MCG IV SOLR
100.0000 ug | Freq: Every day | INTRAVENOUS | Status: DC
  Administered 2015-02-17: 100 ug via INTRAVENOUS
  Filled 2015-02-16 (×2): qty 5

## 2015-02-16 MED ORDER — MESALAMINE 1.2 G PO TBEC
3.6000 g | DELAYED_RELEASE_TABLET | Freq: Every day | ORAL | Status: DC
  Administered 2015-02-17: 3.6 g via ORAL
  Filled 2015-02-16: qty 3

## 2015-02-16 MED ORDER — FLUTICASONE PROPIONATE 50 MCG/ACT NA SUSP
2.0000 | Freq: Every day | NASAL | Status: DC
  Administered 2015-02-17: 2 via NASAL
  Filled 2015-02-16: qty 16

## 2015-02-16 MED ORDER — CALCIUM-MAGNESIUM 500-250 MG PO TABS: 2.0000 | ORAL_TABLET | Freq: Every day | ORAL | Status: DC

## 2015-02-16 MED ORDER — ASPIRIN EC 81 MG PO TBEC: 81.0000 mg | DELAYED_RELEASE_TABLET | Freq: Every evening | ORAL | Status: DC

## 2015-02-16 MED ORDER — VITAMIN D 1000 UNITS PO TABS
2000.0000 [IU] | ORAL_TABLET | Freq: Every day | ORAL | Status: DC
  Administered 2015-02-17: 2000 [IU] via ORAL
  Filled 2015-02-16: qty 2

## 2015-02-16 MED ORDER — ATORVASTATIN CALCIUM 40 MG PO TABS: 40.0000 mg | ORAL_TABLET | Freq: Every day | ORAL | Status: DC

## 2015-02-16 MED ORDER — ENOXAPARIN SODIUM 40 MG/0.4ML ~~LOC~~ SOLN
40.0000 mg | Freq: Every day | SUBCUTANEOUS | Status: DC
  Administered 2015-02-17: 40 mg via SUBCUTANEOUS
  Filled 2015-02-16: qty 0.4

## 2015-02-16 MED ORDER — SODIUM CHLORIDE 0.9 % IV SOLN
INTRAVENOUS | Status: DC
  Administered 2015-02-16: 125 mL/h via INTRAVENOUS

## 2015-02-16 MED ORDER — DONEPEZIL HCL 10 MG PO TABS
10.0000 mg | ORAL_TABLET | Freq: Every day | ORAL | Status: DC
  Administered 2015-02-17: 10 mg via ORAL
  Filled 2015-02-16: qty 1

## 2015-02-16 MED ORDER — SODIUM CHLORIDE 0.9 % IV BOLUS (SEPSIS)
1000.0000 mL | Freq: Once | INTRAVENOUS | Status: AC
  Administered 2015-02-16: 1000 mL via INTRAVENOUS

## 2015-02-16 NOTE — ED Notes (Signed)
Onset today patient sitting down next to husband and became diaphoretic, nausea, syncope for 4-5 minutes per husband. When patient woke up patient alert. EMS called alert dry and had no complaints. Upon arrival to ED has no complaints.

## 2015-02-16 NOTE — ED Notes (Signed)
Attempted report 

## 2015-02-16 NOTE — ED Notes (Signed)
EKG completed given to EDP.  

## 2015-02-16 NOTE — ED Notes (Signed)
Ambulated pt in hall steady gait with walker

## 2015-02-16 NOTE — ED Notes (Signed)
Pt given a sandwich and water to drink.

## 2015-02-16 NOTE — ED Notes (Signed)
MD at bedside. 

## 2015-02-16 NOTE — H&P (Signed)
[facility] History and Physical  [name] [name] DOB: 04-12-1932 DOA: 02/16/2015  Referring physician: ED physician PCP: [name], MD  Specialists:   Chief Complaint: syncope  HPI: [name] is a 82 y.o. female with PMH of hypertension, hyperlipidemia, IBS, Crohn's disease, RLS, syncope, psychosis, bipolar, C. difficile colitis, flutter (s/p of RFA of 2005), dementia, who presents with recurrent syncope.  Patient was recently hospitalized from 01/27-1/31/17 due to syncope and bradycardia, which was likely secondary to vasovagal syncope. Her BB was discontinued. Card recommended outpatient monitor, but has not done yet. Patient reports that she had another episode of syncope when she was a laying on the recliner at home. She felt nauseated and dizzy, and then passed out for few minutes. Per his husband, patient did not have seizure activity. Patient did not have chest pain, shortness breath, unilateral weakness, vision change or hearing loss. No fever or chills.  In ED, patient was found to have hypotension with blood pressure 68/43, which improved to 125/54 after IV fluid treatment, bradycardia with heart rate 49, WBC 6.6, temperature normal, electrolytes and renal function okay. Patient is admitted to inpatient for further eval and treatment.  EKG: Independently reviewed.  QTC 475, Sinus arrhythmia with some artificial effect  Where does patient live?   At home    Can patient participate in ADLs?  Little  Review of Systems:   General: no fevers, chills, no changes in body weight, has poor appetite, has fatigue HEENT: no blurry vision, hearing changes or sore throat Pulm: no dyspnea, coughing, wheezing CV: no chest pain, palpitations Abd: no nausea, vomiting, abdominal pain, diarrhea, constipation GU: no dysuria, burning on urination, increased urinary frequency, hematuria  Ext: no leg edema Neuro: no unilateral weakness, numbness, or tingling, no vision  change or hearing loss Skin: no rash MSK: No muscle spasm, no deformity, no limitation of range of movement in spin Heme: No easy bruising.  Travel history: No recent long distant travel.  Allergy:  Allergies  Allergen Reactions  . Prednisone Other (See Comments)    Patient had psychiatric episode with hallucinations. Caused 3.5 week long hospitalization & loss of memory   . Carbamazepine Nausea Only and Other (See Comments)    Tegretol; dizziness, blurred vision, nausea, headache, insomnia  . Celebrex [Celecoxib] Other (See Comments)    No relief from pain; ineffective  . Codeine Nausea And Vomiting  . Demerol [Meperidine] Nausea And Vomiting  . Diclofenac Sodium Other (See Comments)    severe headache  . Fentanyl Nausea And Vomiting and Other (See Comments)    IV Fentanyl; caused nausea and vomiting Fentanyl patch; caused chest pain, HBP, with second patch experienced vertigo and nausea  . Fluoxetine Hcl Nausea Only and Other (See Comments)    dizziness, blurred vision, nausea, headache, insomnia  . Hydromorphone Hcl Nausea And Vomiting  . Ibuprofen Other (See Comments)    Caused ulcers and colitis  . Indomethacin Nausea Only and Other (See Comments)    dizziness, blurred vision, nausea, headache, insomnia  . Lisinopril Cough  . Meperidine Hcl Nausea And Vomiting  . Morphine Nausea And Vomiting  . Oruvail [Ketoprofen] Other (See Comments)    No relief from pain; ineffective  . Pregabalin Other (See Comments)    severe restless legs, insomnia  . Propranolol Hcl Nausea Only and Other (See Comments)    dizziness, blurred vision, nausea, headache, insomnia  . Refresh Lacri-Lube [Artificial Tears] Swelling    Caused swelling, redness, hard crust on  eyelids   . Relafen [Nabumetone] Other (See Comments)    No relief from pain; ineffective  . Ropinirole Hcl Other (See Comments)    severe restless legs and insomnia  . Sulfamethoxazole Nausea Only and Other (See Comments)    gas,  loose stools  . Bacitracin Hives, Itching and Rash    redness    Past Medical History  Diagnosis Date  . OSA (obstructive sleep apnea)   . History of bronchitis   . Other diseases of lung, not elsewhere classified   . Hypertension   . CAD (coronary artery disease)     LAD 30% 2006  . History of atrial flutter   . Cerebrovascular disease   . Hyperlipemia   . Obesity   . GERD (gastroesophageal reflux disease)   . Diverticulosis of colon   . IBS (irritable bowel syndrome)   . Colonic polyp   . Crohn's disease ([place])   . Hypothyroidism   . History of hyperparathyroidism   . Fibromyalgia   . Osteopenia   . History of headache   . Syncope   . Restless legs syndrome (RLS)   . Psychosis   . History of tuberculosis   . Radiation 05/2008    5000 cGy to left lower eyelid, parotid lymphatics and upper neck  . Memory disturbance   . Bipolar affective ([place])     History of psychosis  . 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 C. difficile diarrhea     Past Surgical History  Procedure Laterality Date  . Parathyroid adenoma surgery    . Cholecystectomy    . Moh';s surg left lower eye lid surgery and lid reconstruction surgery  2/10  . Bladder suspension    . Cataract extraction Bilateral   . Appendectomy    . Tonsillectomy and adenoidectomy    . Lumbar laminectomy    . Incision and drainage perirectal abscess    . Heel spur excision    . Nasal sinus surgery Right     Right maxillary  . Eye lid surgery  04/04/2011    [facility] by Dr. Norlene Duel  . Posterior cervical fusion/foraminotomy N/A 10/12/2013    Procedure: Cervical five-Thoracic two cervico-thoracic posterior fusion;  Surgeon: [name], MD;  Location: [facility];  Service: Neurosurgery;  Laterality: N/A;    Social History:  reports that she quit smoking about 55 years ago. Her smoking use included Cigarettes. She quit after 6 years of use. She has never used smokeless tobacco. She reports that she does not drink  alcohol or use illicit drugs.  Family History:  Family History  Problem Relation Age of Onset  . Colon cancer Maternal Grandmother   . Arthritis Mother   . Hypertension Mother   . Ulcers Mother   . Heart failure Father   . Pulmonary embolism       Prior to Admission medications   Medication Sig Start Date End Date Taking? Authorizing Provider  amLODipine (NORVASC) 2.5 MG tablet Take 1 tablet (2.5 mg total) by mouth daily. 02/14/15  Yes [name], MD  aspirin EC 81 MG tablet Take 81 mg by mouth every evening.   Yes Historical Provider, MD  atorvastatin (LIPITOR) 40 MG tablet TAKE 1 TABLET DAILY 07/12/14  Yes [name], MD  Calcium-Magnesium 500-250 MG TABS Take 2 tablets by mouth daily at 6 PM.  02/18/11  Yes Tammy S Parrett, NP  Cholecalciferol (VITAMIN D3) 2000 UNITS TABS Take 2,000  Units by mouth daily at 12 noon.    Yes Historical Provider, MD  donepezil (ARICEPT) 10 MG tablet Take 1 tablet (10 mg total) by mouth at bedtime. 01/27/15  Yes [name], MD  fluticasone (FLONASE) 50 MCG/ACT nasal spray Place 2 sprays into both nostrils at bedtime.   Yes Historical Provider, MD  levothyroxine (SYNTHROID, LEVOTHROID) 88 MCG tablet TAKE 1 TABLET DAILY BEFORE BREAKFAST 02/16/15  Yes [name], MD  mesalamine (LIALDA) 1.2 G EC tablet Take 3.6 g by mouth daily with lunch.    Yes Historical Provider, MD  pramipexole (MIRAPEX) 0.5 MG tablet TAKE 1 TABLET TWICE A DAY (NEED OFFICE VISIT) 12/05/14  Yes [name], MD  fidaxomicin (DIFICID) 200 MG TABS tablet Take 1 tablet (200 mg total) by mouth 2 (two) times daily. Patient not taking: Reported on 02/16/2015 02/07/15   Caren Griffins, MD    Physical Exam: Filed Vitals:   02/16/15 2345 02/17/15 0000 02/17/15 0044 02/17/15 0353  BP: 122/56 137/40 145/53 120/70  Pulse: 75 72 78 96  Temp:   98.1 F (36.7 C) 97.8 F (36.6 C)  TempSrc:   Oral Oral  Resp: 18 22 20 18   Height:   5' 1"  (1.549 m)   Weight:   73.347 kg  (161 lb 11.2 oz)   SpO2: 97% 97% 98% 99%   General: Not in acute distress HEENT:       Eyes: PERRL, EOMI, no scleral icterus.       ENT: No discharge from the ears and nose, no pharynx injection, no tonsillar enlargement.        Neck: No JVD, no bruit, no mass felt. Heme: No neck lymph node enlargement. Cardiac: S1/S2, RRR, No murmurs, No gallops or rubs. Pulm: Good air movement bilaterally. No rales, wheezing, rhonchi or rubs. Abd: Soft, nondistended, nontender, no rebound pain, no organomegaly, BS present. Ext: No pitting leg edema bilaterally. 2+DP/PT pulse bilaterally. Musculoskeletal: No joint deformities, No joint redness or warmth, no limitation of ROM in spin. Skin: No rashes.  Neuro: Alert, oriented X3, cranial nerves II-XII grossly intact, muscle strength 5/5 in all extremities, sensation to light touch intact. Knee reflex 1+ bilaterally. Negative Babinski's sign. Normal finger to nose test. Psych: Patient is not psychotic, no suicidal or hemocidal ideation.  Labs on Admission:  Basic Metabolic Panel:  Recent Labs Lab 02/16/15 1910  NA 138  K 4.3  CL 103  CO2 23  GLUCOSE 162*  BUN 20  CREATININE 0.87  CALCIUM 9.3   Liver Function Tests: No results for input(s): AST, ALT, ALKPHOS, BILITOT, PROT, ALBUMIN in the last 168 hours. No results for input(s): LIPASE, AMYLASE in the last 168 hours. No results for input(s): AMMONIA in the last 168 hours. CBC:  Recent Labs Lab 02/16/15 1910 02/17/15 0519  WBC 6.6 5.4  HGB 12.6 12.0  HCT 36.9 36.6  MCV 95.3 95.1  PLT 165 143*   Cardiac Enzymes:  Recent Labs Lab 02/17/15 0001  TROPONINI <0.03    BNP (last 3 results)  Recent Labs  02/03/15 1518  BNP 109.2*    ProBNP (last 3 results) No results for input(s): PROBNP in the last 8760 hours.  CBG:  Recent Labs Lab 02/16/15 1907  GLUCAP 148*    Radiological Exams on Admission: No results found.  Assessment/Plan Principal Problem:   Syncope and  collapse Active Problems:   Hypothyroidism   Hyperlipidemia   H/O Psychosis in past, hospitaluized in '09, in ED  Nov 2015   RESTLESS LEG SYNDROME   Essential hypertension   CAD- minor CAD 2006   GASTROESOPHAGEAL REFLUX DISEASE   Irritable bowel syndrome   C7 cervical fracture ([facility])   C. difficile colitis   Syncope   Bradycardia   Hypotension   Syncope and collapse: Etiology is not clear. No focal neurologic findings, less likely to have TIA or stroke. Patient has sinus arrhythmia on EKG, hypotension and bradycardia, indicating cardiac etiology, particularly cardiac arrhythmia. Vasovagal is also possible differential diagnosis. Patient does not have chest pain, less likely to have ACS. Patient just had 2-D echo on 01/27/15, which showed EF 65-70 percent with grade 1 diastolic dysfunction, no aortic valve stenosis. Will not repeat a 2-D echo today.  -Will admit to tele bed for observation -orthostatic vital sign -Troponin 3 -IVF -may ask card to get heart monitor earlier. -pt/ot -Hold Bp meds  Hypothyroidism: Last TSH was 6.294 on 02/03/15 -Increase home Synthroid dose from 88-100 g daily -Check TSH in 4 to 6  weeks  HLD: Last LDL was 75 on 04/01/13 -Continue home medications: Lipitor  HTN: -Hold Bp med due to hypotension  RLS: -Pramipexole  Crohn's Disease: stable -continue mesalamine  C. difficile colitis: Complicated Dificid. No diarrhea or abdominal pain -No further treatment needed.  Dementia:  -Donepezil   DVT ppx: SQ Lovenox  Code Status: Full code Family Communication: Yes, patient's husband at bed side Disposition Plan: Admit to inpatient   Date of Service 02/17/2015    [name] [facility] Pager [phone]  If 7PM-7AM, please contact night-coverage [email or web address] Password [email or web address] 02/17/2015, 6:18 AM

## 2015-02-16 NOTE — ED Provider Notes (Signed)
CSN: [id number]     Arrival date & time 02/16/15  1838 History   First MD Initiated Contact with Patient 02/16/15 1839     Chief Complaint  Patient presents with  . Loss of Consciousness     (Consider location/radiation/quality/duration/timing/severity/associated sxs/prior Treatment) HPI   [name] is a 82 y.o. female who presents for evaluation of syncope, which occurred at home today. She was doing well, had been lying down for a short nap, sat on the edge of the bed and became diaphoretic, then passed out. She had a very brief episode of loss of consciousness. She was transferred here by EMS for evaluation. She denies recent fever, chills, nausea, vomiting, weakness or dizziness. She saw her PCP for checkup following hospital discharge, 3 days ago. It appears that they ordered her Norvasc, 2 days ago, to be restarted. She states that she did not get that prescription and has not been on it since hospital discharge. She had been hospitalized about 2 weeks ago for an episode of syncope associated with bradycardia. She was taken off her carvedilol. She had been doing well post hospital discharge, until the episode of syncope, today. She is being followed at home for assistance, by home health and adult protective services. Currently, she has difficulty taking medicines correctly, with occasional episodes of anger outbursts, and not doing well with her relationship with her husband. There are no other no modifying factors.   Past Medical History  Diagnosis Date  . OSA (obstructive sleep apnea)   . History of bronchitis   . Other diseases of lung, not elsewhere classified   . Hypertension   . CAD (coronary artery disease)     LAD 30% 2006  . History of atrial flutter   . Cerebrovascular disease   . Hyperlipemia   . Obesity   . GERD (gastroesophageal reflux disease)   . Diverticulosis of colon   . IBS (irritable bowel syndrome)   . Colonic polyp   . Crohn's disease ([place])   .  Hypothyroidism   . History of hyperparathyroidism   . Fibromyalgia   . Osteopenia   . History of headache   . Syncope   . Restless legs syndrome (RLS)   . Psychosis   . History of tuberculosis   . Radiation 05/2008    5000 cGy to left lower eyelid, parotid lymphatics and upper neck  . Memory disturbance   . Bipolar affective ([facility])     History of psychosis  . 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 C. difficile diarrhea    Past Surgical History  Procedure Laterality Date  . Parathyroid adenoma surgery    . Cholecystectomy    . Moh';s surg left lower eye lid surgery and lid reconstruction surgery  2/10  . Bladder suspension    . Cataract extraction Bilateral   . Appendectomy    . Tonsillectomy and adenoidectomy    . Lumbar laminectomy    . Incision and drainage perirectal abscess    . Heel spur excision    . Nasal sinus surgery Right     Right maxillary  . Eye lid surgery  04/04/2011    [facility] by Dr. Norlene Duel  . Posterior cervical fusion/foraminotomy N/A 10/12/2013    Procedure: Cervical five-Thoracic two cervico-thoracic posterior fusion;  Surgeon: [name], MD;  Location: [facility];  Service: Neurosurgery;  Laterality: N/A;   Family History  Problem Relation Age of Onset  . Colon cancer  Maternal Grandmother   . Arthritis Mother   . Hypertension Mother   . Ulcers Mother   . Heart failure Father   . Pulmonary embolism     Social History  Substance Use Topics  . Smoking status: Former Smoker -- 6 years    Types: Cigarettes    Quit date: 01/08/1960  . Smokeless tobacco: Never Used  . Alcohol Use: No   OB History    No data available     Review of Systems  All other systems reviewed and are negative.     Allergies  Prednisone; Carbamazepine; Celebrex; Codeine; Demerol; Diclofenac sodium; Fentanyl; Fluoxetine hcl; Hydromorphone hcl; Ibuprofen; Indomethacin; Lisinopril; Meperidine hcl; Morphine; [place]; Pregabalin; Propranolol hcl; Refresh lacri-lube;  Relafen; Ropinirole hcl; Sulfamethoxazole; and Bacitracin  Home Medications   Prior to Admission medications   Medication Sig Start Date End Date Taking? Authorizing Provider  amLODipine (NORVASC) 2.5 MG tablet Take 1 tablet (2.5 mg total) by mouth daily. 02/14/15  Yes [name], MD  aspirin EC 81 MG tablet Take 81 mg by mouth every evening.   Yes Historical Provider, MD  atorvastatin (LIPITOR) 40 MG tablet TAKE 1 TABLET DAILY 07/12/14  Yes [name], MD  Calcium-Magnesium 500-250 MG TABS Take 2 tablets by mouth daily at 6 PM.  02/18/11  Yes Tammy S Parrett, NP  Cholecalciferol (VITAMIN D3) 2000 UNITS TABS Take 2,000 Units by mouth daily at 12 noon.    Yes Historical Provider, MD  donepezil (ARICEPT) 10 MG tablet Take 1 tablet (10 mg total) by mouth at bedtime. 01/27/15  Yes [name], MD  fluticasone (FLONASE) 50 MCG/ACT nasal spray Place 2 sprays into both nostrils at bedtime.   Yes Historical Provider, MD  levothyroxine (SYNTHROID, LEVOTHROID) 88 MCG tablet TAKE 1 TABLET DAILY BEFORE BREAKFAST 02/16/15  Yes [name], MD  mesalamine (LIALDA) 1.2 G EC tablet Take 3.6 g by mouth daily with lunch.    Yes Historical Provider, MD  pramipexole (MIRAPEX) 0.5 MG tablet TAKE 1 TABLET TWICE A DAY (NEED OFFICE VISIT) 12/05/14  Yes [name], MD  fidaxomicin (DIFICID) 200 MG TABS tablet Take 1 tablet (200 mg total) by mouth 2 (two) times daily. Patient not taking: Reported on 02/16/2015 02/07/15   Caren Griffins, MD   BP 124/54 mmHg  Pulse 79  Temp(Src) 97.9 F (36.6 C) (Oral)  Resp 19  Ht 5\' 3"  (1.6 m)  Wt 160 lb (72.576 kg)  BMI 28.35 kg/m2  SpO2 98% Physical Exam  Constitutional: She is oriented to person, place, and time. She appears well-developed. She appears distressed.  Elderly, frail. This exam, by me, at 19:01, following a episode of hypotension.  HENT:  Head: Normocephalic and atraumatic.  Right Ear: External ear normal.  Left Ear: External ear  normal.  Eyes: Conjunctivae and EOM are normal. Pupils are equal, round, and reactive to light.  Neck: Normal range of motion and phonation normal. Neck supple.  Cardiovascular: Normal rate, regular rhythm and normal heart sounds.   Blood pressure 68/43  Pulmonary/Chest: Effort normal and breath sounds normal. She exhibits no bony tenderness.  Abdominal: Soft. There is no tenderness.  Musculoskeletal: Normal range of motion. She exhibits no edema or tenderness.  Neurological: She is alert and oriented to person, place, and time. No cranial nerve deficit or sensory deficit. She exhibits normal muscle tone. Coordination normal.  No dysarthria and aphasia or nystagmus.  Skin: Skin is warm and intact.  Diaphoretic  Psychiatric:  She has a normal mood and affect. Her behavior is normal.  Nursing note and vitals reviewed.   ED Course  Procedures (including critical care time)  I was called to the room to see the patient because she was diaphoretic and hypotensive. Sitting in the semi-Fowler's position. I laid the stretcher supine, and in a few minutes, repeated blood pressure and it was improved to 79/45.   Medications  0.9 %  sodium chloride infusion (125 mL/hr Intravenous New Bag/Given 02/16/15 2001)  sodium chloride 0.9 % bolus 1,000 mL (0 mLs Intravenous Stopped 02/16/15 2000)    Patient Vitals for the past 24 hrs:  BP Temp Temp src Pulse Resp SpO2 Height Weight  02/16/15 2215 (!) 124/54 mmHg - - 79 19 98 % - -  02/16/15 2200 (!) 111/50 mmHg - - 85 19 96 % - -  02/16/15 2130 119/58 mmHg - - 73 23 96 % - -  02/16/15 2115 134/64 mmHg - - 86 (!) 28 97 % - -  02/16/15 2100 112/91 mmHg - - 72 (!) 30 100 % - -  02/16/15 2045 121/69 mmHg - - 82 21 97 % - -  02/16/15 2044 - - - 83 18 100 % - -  02/16/15 2030 140/94 mmHg - - 76 (!) 27 99 % - -  02/16/15 2001 (!) 115/49 mmHg - - 73 19 99 % - -  02/16/15 1947 (!) 107/53 mmHg - - 72 18 97 % - -  02/16/15 1945 (!) 107/53 mmHg - - 68 16 97 % - -   02/16/15 1930 (!) 98/48 mmHg - - - - - - -  02/16/15 1930 (!) 118/46 mmHg - - 69 23 98 % - -  02/16/15 1929 (!) 105/46 mmHg - - - - - - -  02/16/15 1925 (!) 121/46 mmHg - - 67 20 99 % - -  02/16/15 1921 - - - - - 97 % - -  02/16/15 1920 (!) 110/46 mmHg - - 64 22 97 % - -  02/16/15 1915 (!) 74/41 mmHg - - (!) 49 22 93 % - -  02/16/15 1910 - - - - 20 - - -  02/16/15 1905 (!) 79/45 mmHg - - (!) 57 18 96 % - -  02/16/15 1900 (!) 68/43 mmHg - - 63 19 98 % - -  02/16/15 1856 - - - - - - 5\' 3"  (1.6 m) 160 lb (72.576 kg)  02/16/15 1852 - - - - - 95 % - -  02/16/15 1850 107/56 mmHg 97.9 F (36.6 C) Oral 74 21 98 % - -    10:31 PM Reevaluation with update and discussion. After initial assessment and treatment, an updated evaluation reveals patient is alert, feels well, is able to ambulate about 35 feet. The hallway bathroom, then back to her simulation room. Findings discussed with patient and husband, all questions answered. [name]    Labs Review Labs Reviewed  BASIC METABOLIC PANEL - Abnormal; Notable for the following:    Glucose, Bld 162 (*)    All other components within normal limits  CBG MONITORING, ED - Abnormal; Notable for the following:    Glucose-Capillary 148 (*)    All other components within normal limits  CBC  URINALYSIS, ROUTINE W REFLEX MICROSCOPIC (NOT AT [facility])    Imaging Review No results found. I have personally reviewed and evaluated these images and lab results as part of my medical decision-making.   EKG Interpretation  Date/Time:  Thursday February 16 2015 18:49:46 EST Ventricular Rate:  79 PR Interval:  141 QRS Duration: 87 QT Interval:  414 QTC Calculation: 475 R Axis:   22 Text Interpretation:  Sinus rhythm Artifact since last tracing no  significant change Confirmed by Shadia Larose  MD, Vira Agar [id number]) on 02/16/2015  7:00:10 PM      MDM   Final diagnoses:  Syncope, unspecified syncope type  Hypotension, unspecified hypotension type     Syncope with hypotension. She was recently hospitalized for another episode of syncope associated with bradycardia. There is no clear cause for the hypotension today. Suspect that she has neurogenic vasovagal. She will need close monitoring, and further evaluation, and possibly be started on additional medication to control her symptoms.  Nursing Notes Reviewed/ Care Coordinated, and agree without changes. Applicable Imaging Reviewed.  Interpretation of Laboratory Data incorporated into ED treatment  Plan: Admit    Daleen Bo, MD 02/16/15 2318

## 2015-02-16 NOTE — ED Notes (Signed)
Patient suddenly stated started to sweat and feel like going pass out. Doctor notified.

## 2015-02-17 ENCOUNTER — Other Ambulatory Visit: Payer: Self-pay | Admitting: Physician Assistant

## 2015-02-17 DIAGNOSIS — R55 Syncope and collapse: Secondary | ICD-10-CM | POA: Diagnosis not present

## 2015-02-17 DIAGNOSIS — I10 Essential (primary) hypertension: Secondary | ICD-10-CM

## 2015-02-17 DIAGNOSIS — R001 Bradycardia, unspecified: Secondary | ICD-10-CM | POA: Diagnosis not present

## 2015-02-17 DIAGNOSIS — I959 Hypotension, unspecified: Secondary | ICD-10-CM

## 2015-02-17 DIAGNOSIS — E038 Other specified hypothyroidism: Secondary | ICD-10-CM

## 2015-02-17 DIAGNOSIS — G4733 Obstructive sleep apnea (adult) (pediatric): Secondary | ICD-10-CM | POA: Diagnosis not present

## 2015-02-17 LAB — TROPONIN I: Troponin I: <0.03 ng/mL (ref <0.031)

## 2015-02-17 LAB — COMPREHENSIVE METABOLIC PANEL
ALBUMIN: 3.1 g/dL — AB (ref 3.5–5.0)
ALT: 16 U/L (ref 14–54)
ANION GAP: 9 (ref 5–15)
AST: 18 U/L (ref 15–41)
Alkaline Phosphatase: 57 U/L (ref 38–126)
BUN: 16 mg/dL (ref 6–20)
CALCIUM: 8.9 mg/dL (ref 8.9–10.3)
CHLORIDE: 107 mmol/L (ref 101–111)
CO2: 25 mmol/L (ref 22–32)
Creatinine, Ser: 0.58 mg/dL (ref 0.44–1.00)
GFR calc non Af Amer: >60 mL/min (ref >60)
Glucose, Bld: 89 mg/dL (ref 65–99)
POTASSIUM: 3.9 mmol/L (ref 3.5–5.1)
SODIUM: 141 mmol/L (ref 135–145)
Total Bilirubin: 0.9 mg/dL (ref 0.3–1.2)
Total Protein: 5.8 g/dL — ABNORMAL LOW (ref 6.5–8.1)

## 2015-02-17 LAB — CBC
HEMATOCRIT: 36.6 % (ref 36.0–46.0)
HEMOGLOBIN: 12 g/dL (ref 12.0–15.0)
MCH: 31.2 pg (ref 26.0–34.0)
MCHC: 32.8 g/dL (ref 30.0–36.0)
MCV: 95.1 fL (ref 78.0–100.0)
Platelets: 143 10*3/uL — ABNORMAL LOW (ref 150–400)
RBC: 3.85 MIL/uL — AB (ref 3.87–5.11)
RDW: 13.6 % (ref 11.5–15.5)
WBC: 5.4 10*3/uL (ref 4.0–10.5)

## 2015-02-17 LAB — URINALYSIS, ROUTINE W REFLEX MICROSCOPIC
BILIRUBIN URINE: NEGATIVE
Glucose, UA: NEGATIVE mg/dL
Hgb urine dipstick: NEGATIVE
KETONES UR: NEGATIVE mg/dL
LEUKOCYTES UA: NEGATIVE
NITRITE: NEGATIVE
Protein, ur: NEGATIVE mg/dL
Specific Gravity, Urine: 1.029 (ref 1.005–1.030)
pH: 5 (ref 5.0–8.0)

## 2015-02-17 LAB — PROTIME-INR
INR: 1.11 (ref 0.00–1.49)
PROTHROMBIN TIME: 14.5 s (ref 11.6–15.2)

## 2015-02-17 LAB — MRSA PCR SCREENING: MRSA BY PCR: NEGATIVE

## 2015-02-17 MED ORDER — MAGNESIUM OXIDE 400 (241.3 MG) MG PO TABS: 200.0000 mg | ORAL_TABLET | Freq: Every day | ORAL | Status: DC

## 2015-02-17 MED ORDER — ATORVASTATIN CALCIUM 40 MG PO TABS: 40.0000 mg | ORAL_TABLET | Freq: Every day | ORAL | Status: DC

## 2015-02-17 MED ORDER — LEVOTHYROXINE SODIUM 100 MCG PO TABS: 100.0000 ug | ORAL_TABLET | Freq: Every day | ORAL | Status: DC

## 2015-02-17 MED ORDER — SODIUM CHLORIDE 0.9 % IV SOLN
INTRAVENOUS | Status: AC
  Administered 2015-02-17: 04:00:00 via INTRAVENOUS

## 2015-02-17 MED ORDER — CALCIUM CARBONATE 1250 (500 CA) MG PO TABS: 1.0000 | ORAL_TABLET | Freq: Every day | ORAL | Status: DC

## 2015-02-17 NOTE — Evaluation (Signed)
Occupational Therapy Evaluation Patient Details Name: [name] MRN: [id number] DOB: 19-Jun-1932 Today's Date: 02/17/2015    History of Present Illness 82 y.o. female with PMH of hypertension, hyperlipidemia, IBS, Crohn's disease, RLS, syncope, psychosis, bipolar, C. difficile colitis, flutter (s/p of RFA of 2005), dementia, who presents with recurrent syncope.   Clinical Impression   Pt is performing self care and mobility at a supervision to modified independent level which is her baseline. Will have constant supervision of her husband upon discharge.  All equipment needs are met.    Follow Up Recommendations  No OT follow up;Supervision/Assistance - 24 hour    Equipment Recommendations  None recommended by OT    Recommendations for Other Services       Precautions / Restrictions Precautions Precautions: Fall Restrictions Weight Bearing Restrictions: No      Mobility Bed Mobility Overal bed mobility: Independent             General bed mobility comments: up in recliner  Transfers   Equipment used: Rolling walker (2 wheeled) Transfers: Sit to/from Stand Sit to Stand: Supervision Stand pivot transfers: Supervision       General transfer comment: supervision for safety only    Balance   Sitting-balance support: Feet supported;No upper extremity supported Sitting balance-Leahy Scale: Good     Standing balance support: Bilateral upper extremity supported;During functional activity Standing balance-Leahy Scale: Fair                              ADL Overall ADL's : Needs assistance/impaired                                       General ADL Comments: Pt performing ADL at a supervision to modified independent level.     Vision     Perception     Praxis      Pertinent Vitals/Pain Pain Assessment: No/denies pain     Hand Dominance Right   Extremity/Trunk Assessment Upper Extremity Assessment Upper Extremity  Assessment: Overall WFL for tasks assessed   Lower Extremity Assessment Lower Extremity Assessment: Overall WFL for tasks assessed   Cervical / Trunk Assessment Cervical / Trunk Assessment: Kyphotic   Communication Communication Communication: No difficulties   Cognition Arousal/Alertness: Awake/alert Behavior During Therapy: WFL for tasks assessed/performed Overall Cognitive Status: History of cognitive impairments - at baseline       Memory: Decreased short-term memory             General Comments       Exercises       Shoulder Instructions      Home Living Family/patient expects to be discharged to:: Private residence Living Arrangements: Spouse/significant other Available Help at Discharge: Family;Available 24 hours/day Type of Home: Independent living facility Home Access: Level entry     Home Layout: One level     Bathroom Shower/Tub: Occupational psychologist: Standard Bathroom Accessibility: Yes How Accessible: Accessible via walker Home Equipment: [place] - 4 wheels;Bedside commode;Shower seat;Walker - 2 wheels   Additional Comments: Pt and husband live in I living facility       Prior Functioning/Environment Level of Independence: Independent with assistive device(s)        Comments: Pt and husband always together.    OT Diagnosis: Cognitive deficits;Generalized weakness   OT Problem List:  OT Treatment/Interventions:      OT Goals(Current goals can be found in the care plan section) Acute Rehab OT Goals Patient Stated Goal: home today  OT Frequency:     Barriers to D/C:            Co-evaluation              End of Session Equipment Utilized During Treatment: Rolling walker  Activity Tolerance: Patient tolerated treatment well Patient left: in chair;with call bell/phone within reach;with family/visitor present   Time: 5573-2202 OT Time Calculation (min): 16 min Charges:  OT General Charges $OT Visit: 1  Procedure OT Evaluation $OT Eval Low Complexity: 1 Procedure G-Codes: OT G-codes **NOT FOR INPATIENT CLASS** Functional Assessment Tool Used: clinical judgement Functional Limitation: Self care Self Care Current Status (R4270): At least 1 percent but less than 20 percent impaired, limited or restricted Self Care Goal Status (W2376): At least 1 percent but less than 20 percent impaired, limited or restricted Self Care Discharge Status [phone]): At least 1 percent but less than 20 percent impaired, limited or restricted  Malka So 02/17/2015, 12:56 PM  [phone]

## 2015-02-17 NOTE — Consult Note (Signed)
CARDIOLOGY CONSULT NOTE   Patient ID: [name] MRN: [id number] DOB/AGE: Nov 23, 1932 82 y.o.  Admit date: 02/16/2015  Consulting Physician: Primary Physician   [name], MD Primary Cardiologist   Seen by Dr. Percival Spanish in 10/2011 Reason for Consultation   Recurrent syncope  HPI: [name] is a 82 y.o. 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   She has a history of non obstructive CAD with cardiac catheterization in 2006 which demonstrated only 30% LAD stenosis.  She was last seen in the office by Dr. Liliane Channel in 10/2011 and felt to be doing well.  She was recently hospitalized from 01/27-1/31/17 due to syncope and bradycardia (HR in 30-40s), which was likely secondary to vasovagal syncope. Her BB was discontinued and the bradycardia resolved without significant rebound. Dr. Claiborne Billings recommended an outpatient monitor but this was not set up yet. 2-D echo during this admission showed normal EF 65-70%, no RWMAs or significant valvular disease.  She was doing well until yesterday afternoon when she was laying in her recliner. She sat up and suddenly felt nauseated and dizzy. Her husband thinks she may have lost consciousness, but he and the patient are not sure. She denies chest pain, shortness of breath, or palpitations. Her husband got worried and called the ambulance.   In ED, the patient was found to be hypotensive with blood pressure 68/43, which improved to 125/54 after IV fluid treatment, bradycardia with heart rate 49, WBC 6.6, temperature normal, electrolytes and renal function okay. Patient is admitted to inpatient for further eval and treatment. She has not had any further presyncope or syncopal episodes since being admitted. She is feeling fine and asking to go  home.   Past Medical History  Diagnosis Date  . OSA (obstructive sleep apnea)   . History of bronchitis   . Other diseases of lung, not elsewhere classified   . Hypertension   . CAD (coronary artery disease)     LAD 30% 2006  . History of atrial flutter   . Cerebrovascular disease   . Hyperlipemia   . Obesity   . GERD (gastroesophageal reflux disease)   . Diverticulosis of colon   . IBS (irritable bowel syndrome)   . Colonic polyp   . Crohn's disease ([place])   . Hypothyroidism   . History of hyperparathyroidism   . Fibromyalgia   . Osteopenia   . History of headache   . Syncope   . Restless legs syndrome (RLS)   . Psychosis   . History of tuberculosis   . Radiation 05/2008    5000 cGy to left lower eyelid, parotid lymphatics and upper neck  . Memory disturbance   . Bipolar affective ([place])     History of psychosis  . 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 C. difficile diarrhea      Past Surgical History  Procedure Laterality Date  . Parathyroid adenoma surgery    . Cholecystectomy    . Moh';s surg left lower eye lid surgery and lid reconstruction surgery  2/10  . Bladder suspension    . Cataract extraction Bilateral   . Appendectomy    . Tonsillectomy and adenoidectomy    . Lumbar laminectomy    . Incision and drainage perirectal abscess    .  Heel spur excision    . Nasal sinus surgery Right     Right maxillary  . Eye lid surgery  04/04/2011    [facility] by Dr. Norlene Duel  . Posterior cervical fusion/foraminotomy N/A 10/12/2013    Procedure: Cervical five-Thoracic two cervico-thoracic posterior fusion;  Surgeon: [name], MD;  Location: [facility];  Service: Neurosurgery;  Laterality: N/A;    Allergies  Allergen Reactions  . Prednisone Other (See Comments)    Patient had psychiatric episode with hallucinations. Caused 3.5 week long hospitalization & loss of memory   . Carbamazepine Nausea Only and Other (See Comments)    Tegretol; dizziness, blurred  vision, nausea, headache, insomnia  . Celebrex [Celecoxib] Other (See Comments)    No relief from pain; ineffective  . Codeine Nausea And Vomiting  . Demerol [Meperidine] Nausea And Vomiting  . Diclofenac Sodium Other (See Comments)    severe headache  . Fentanyl Nausea And Vomiting and Other (See Comments)    IV Fentanyl; caused nausea and vomiting Fentanyl patch; caused chest pain, HBP, with second patch experienced vertigo and nausea  . Fluoxetine Hcl Nausea Only and Other (See Comments)    dizziness, blurred vision, nausea, headache, insomnia  . Hydromorphone Hcl Nausea And Vomiting  . Ibuprofen Other (See Comments)    Caused ulcers and colitis  . Indomethacin Nausea Only and Other (See Comments)    dizziness, blurred vision, nausea, headache, insomnia  . Lisinopril Cough  . Meperidine Hcl Nausea And Vomiting  . Morphine Nausea And Vomiting  . Oruvail [Ketoprofen] Other (See Comments)    No relief from pain; ineffective  . Pregabalin Other (See Comments)    severe restless legs, insomnia  . Propranolol Hcl Nausea Only and Other (See Comments)    dizziness, blurred vision, nausea, headache, insomnia  . Refresh Lacri-Lube [Artificial Tears] Swelling    Caused swelling, redness, hard crust on eyelids   . Relafen [Nabumetone] Other (See Comments)    No relief from pain; ineffective  . Ropinirole Hcl Other (See Comments)    severe restless legs and insomnia  . Sulfamethoxazole Nausea Only and Other (See Comments)    gas, loose stools  . Bacitracin Hives, Itching and Rash    redness    I have reviewed the patient's current medications . sodium chloride   Intravenous STAT  . aspirin EC  81 mg Oral QPM  . atorvastatin  40 mg Oral q1800  . calcium carbonate  1 tablet Oral q1800   And  . magnesium oxide  200 mg Oral q1800  . cholecalciferol  2,000 Units Oral Q1200  . donepezil  10 mg Oral QHS  . enoxaparin (LOVENOX) injection  40 mg Subcutaneous Daily  . fluticasone  2 spray  Each Nare QHS  . [START ON 02/18/2015] levothyroxine  100 mcg Oral QAC breakfast  . mesalamine  3.6 g Oral Q lunch  . pramipexole  0.5 mg Oral TID  . sodium chloride flush  3 mL Intravenous Q12H     acetaminophen **OR** acetaminophen, ondansetron **OR** ondansetron (ZOFRAN) IV  Prior to Admission medications   Medication Sig Start Date End Date Taking? Authorizing Provider  amLODipine (NORVASC) 2.5 MG tablet Take 1 tablet (2.5 mg total) by mouth daily. 02/14/15  Yes [name], MD  aspirin EC 81 MG tablet Take 81 mg by mouth every evening.   Yes Historical Provider, MD  atorvastatin (LIPITOR) 40 MG tablet TAKE 1 TABLET DAILY 07/12/14  Yes [name], MD  Calcium-Magnesium 500-250 MG TABS Take 2 tablets by mouth daily at 6 PM.  02/18/11  Yes Tammy S Parrett, NP  Cholecalciferol (VITAMIN D3) 2000 UNITS TABS Take 2,000 Units by mouth daily at 12 noon.    Yes Historical Provider, MD  donepezil (ARICEPT) 10 MG tablet Take 1 tablet (10 mg total) by mouth at bedtime. 01/27/15  Yes [name], MD  fluticasone (FLONASE) 50 MCG/ACT nasal spray Place 2 sprays into both nostrils at bedtime.   Yes Historical Provider, MD  levothyroxine (SYNTHROID, LEVOTHROID) 88 MCG tablet TAKE 1 TABLET DAILY BEFORE BREAKFAST 02/16/15  Yes [name], MD  mesalamine (LIALDA) 1.2 G EC tablet Take 3.6 g by mouth daily with lunch.    Yes Historical Provider, MD  pramipexole (MIRAPEX) 0.5 MG tablet TAKE 1 TABLET TWICE A DAY (NEED OFFICE VISIT) 12/05/14  Yes [name], MD  fidaxomicin (DIFICID) 200 MG TABS tablet Take 1 tablet (200 mg total) by mouth 2 (two) times daily. Patient not taking: Reported on 02/16/2015 02/07/15   Caren Griffins, MD     Social History   Social History  . Marital Status: Married    Spouse Name: Barnabas Lister  . Number of Children: 2  . Years of Education: N/A   Occupational History  .     Social History Main Topics  . Smoking status: Former Smoker -- 6 years    Types:  Cigarettes    Quit date: 01/08/1960  . Smokeless tobacco: Never Used  . Alcohol Use: No  . Drug Use: No  . Sexual Activity: Not on file   Other Topics Concern  . Not on file   Social History Narrative   Patient is married with 2 children   Patient is right handed   Patient is a retired [employer] scientist   Patient does not drink any caffeine.    Family Status  Relation Status Death Age  . Father Deceased 67    congestive heart failure  . Mother Deceased 66    stroke, bleeding ulcer  . Sister Deceased 44    PE  . Maternal Grandmother Deceased 60    colon cancer   Family History  Problem Relation Age of Onset  . Colon cancer Maternal Grandmother   . Arthritis Mother   . Hypertension Mother   . Ulcers Mother   . Heart failure Father   . Pulmonary embolism       ROS:  Full 14 point review of systems complete and found to be negative unless listed above.  Physical Exam: Blood pressure 120/70, pulse 96, temperature 97.8 F (36.6 C), temperature source Oral, resp. rate 18, height 5' 1"  (1.549 m), weight 161 lb 11.2 oz (73.347 kg), SpO2 99 %.  General: Well developed, well nourished, female in no acute distress Head: Eyes PERRLA, No xanthomas.   Normocephalic and atraumatic, oropharynx without edema or exudate.  Lungs: Ctab Heart: HRRR S1 S2, no rub/gallop, Heart regular rate and rhythm with S1, S2  murmur. pulses are 2+ extrem.   Neck: No carotid bruits. No lymphadenopathy.  No JVD. Abdomen: Bowel sounds present, abdomen soft and non-tender without masses or hernias noted. Msk:  No spine or cva tenderness. No weakness, no joint deformities or effusions. Extremities: No clubbing or cyanosis. No LE edema.  Neuro: Alert and oriented X 3. No focal deficits noted. Psych:  Good affect, responds appropriately Skin: No rashes or lesions noted.  Labs:   Lab Results  Component Value Date  WBC 5.4 02/17/2015   HGB 12.0 02/17/2015   HCT 36.6 02/17/2015   MCV 95.1 02/17/2015   PLT 143*  02/17/2015    Recent Labs  02/17/15 0001  INR 1.11    Recent Labs Lab 02/17/15 0519  NA 141  K 3.9  CL 107  CO2 25  BUN 16  CREATININE 0.58  CALCIUM 8.9  PROT 5.8*  BILITOT 0.9  ALKPHOS 57  ALT 16  AST 18  GLUCOSE 89  ALBUMIN 3.1*   MAGNESIUM  Date Value Ref Range Status  02/03/2015 2.4 1.7 - 2.4 mg/dL Final    Recent Labs  02/17/15 0001 02/17/15 0519 02/17/15 1033  TROPONINI <0.03 <0.03 <0.03    Echo: 02/04/2015 LV EF: 65% -  70% Study Conclusions - Left ventricle: The cavity size was normal. Systolic function was vigorous. The estimated ejection fraction was in the range of 65% to 70%. Wall motion was normal; there were no regional wall motion abnormalities. Doppler parameters are consistent with abnormal left ventricular relaxation (grade 1 diastolic dysfunction). There was no evidence of elevated ventricular filling pressure by Doppler parameters. - Aortic valve: There was no regurgitation. - Aortic root: The aortic root was normal in size. - Mitral valve: Structurally normal valve. There was mild regurgitation. - Left atrium: The atrium was mildly dilated. - Right ventricle: Systolic function was normal. - Right atrium: The atrium was normal in size. - Tricuspid valve: There was mild regurgitation. - Pulmonic valve: There was no regurgitation. - Pulmonary arteries: Systolic pressure was at the upper limits of normal. PA peak pressure: 32 mm Hg (S). - Inferior vena cava: The vessel was normal in size. - Pericardium, extracardiac: There was no pericardial effusion.  ECG: HR 79 NSR with PAC   Radiology:  No results found.  ASSESSMENT AND PLAN:    Principal Problem:   Syncope and collapse Active Problems:   Hypothyroidism   Hyperlipidemia   H/O Psychosis in past, hospitaluized in '09, in ED Nov 2015   RESTLESS LEG SYNDROME   Essential hypertension   CAD- minor CAD 2006   GASTROESOPHAGEAL REFLUX DISEASE   Irritable bowel  syndrome   C7 cervical fracture (HCC)   C. difficile colitis   Syncope   Bradycardia   Hypotension  [name] is a 82 y.o. 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   Recurrent syncope: associated with nausea and dizziness most consistent with vasovagal syncope. However I do agree she should wear heart monitor. I have ordered this and scheduled an appointment to pick one up next Tuesday. I have also arranged for follow-up with Dr. Susy Manor in 6 weeks after her monitor is completed.  HTN: BP well controlled on current regimen. She had an episode of hypotension that responded well to IVFs  Bradycardia: No for the most part resolved. Continue off beta blocker.   SignedCrista Luria 02/17/2015 12:40 PM  Pager [phone]  Co-Sign MD   Agree with note by Nell Range PA-C  We were asked to see this lovely lady with syncope. She is a patient of Dr. Rosezella [place]. She has normal LV function. She was here last month with syncope as well and her beta blockers were discontinued. She describes symptoms consistent with a vasovagal episode. Initially she was hypotensive  and bradycardic. Her rhythm is sinus bradycardia on telemetry. She's been resuscitated and is clinically improved. Her exam is benign. She can be discharged home later today with outpatient event monitor which has already been arranged as well as a follow-up appointment with Dr. Percival Spanish. Lorretta Harp, M.D., [place], [facility], [facility] [address] [place], [place]  [address]  [phone] 02/17/2015 1:58 PM

## 2015-02-17 NOTE — Evaluation (Signed)
Physical Therapy Evaluation Patient Details Name: [name] MRN: [id number] DOB: [birth year] Today's Date: 02/17/2015   History of Present Illness  82 y.o. female with PMH of hypertension, hyperlipidemia, IBS, Crohn's disease, RLS, syncope, psychosis, bipolar, C. difficile colitis, flutter (s/p of RFA of 2005), dementia, who presents with recurrent syncope.  Clinical Impression  Pt admitted with above. PT eval complete. Pt is at baseline for functional mobility. She verbalizes no questions or concerns regarding mobility. She is hopeful to d/c home today. No further skilled PT intervention indicated. PT signing off.    Follow Up Recommendations No PT follow up    Equipment Recommendations  None recommended by PT    Recommendations for Other Services       Precautions / Restrictions Precautions Precautions: Fall Restrictions Weight Bearing Restrictions: No      Mobility  Bed Mobility Overal bed mobility: Independent             General bed mobility comments: up in recliner  Transfers   Equipment used: Rolling walker (2 wheeled) Transfers: Sit to/from [facility] Sit to Stand: Supervision Stand pivot transfers: Supervision       General transfer comment: supervision for safety only  Ambulation/Gait Ambulation/Gait assistance: Supervision Ambulation Distance (Feet): 350 Feet Assistive device: Rolling walker (2 wheeled)   Gait velocity: WFL   General Gait Details: steady gait using RW with no LOB  Stairs            Wheelchair Mobility    Modified Rankin (Stroke Patients Only)       Balance   Sitting-balance support: Feet supported;No upper extremity supported Sitting balance-Leahy Scale: Good     Standing balance support: Bilateral upper extremity supported;During functional activity Standing balance-Leahy Scale: Fair                               Pertinent Vitals/Pain Pain Assessment: No/denies pain    Home  Living Family/patient expects to be discharged to:: Private residence Living Arrangements: Spouse/significant other Available Help at Discharge: Family;Available 24 hours/day Type of Home: Independent living facility Home Access: Level entry     Home Layout: One level Home Equipment: Walker - 4 wheels;Bedside commode;Shower seat;Walker - 2 wheels Additional Comments: Pt and husband live in I living facility     Prior Function Level of Independence: Independent with assistive device(s)         Comments: Pt and husband always together.     Hand Dominance   Dominant Hand: Right    Extremity/Trunk Assessment   Upper Extremity Assessment: Overall WFL for tasks assessed           Lower Extremity Assessment: Overall WFL for tasks assessed      Cervical / Trunk Assessment: Kyphotic  Communication   Communication: No difficulties  Cognition Arousal/Alertness: Awake/alert Behavior During Therapy: WFL for tasks assessed/performed Overall Cognitive Status: History of cognitive impairments - at baseline       Memory: Decreased short-term memory              General Comments      Exercises        Assessment/Plan    PT Assessment Patent does not need any further PT services  PT Diagnosis Difficulty walking   PT Problem List Decreased activity tolerance;Decreased mobility;Decreased safety awareness  PT Treatment Interventions     PT Goals (Current goals can be found in the Care Plan section) Acute Rehab  PT Goals Patient Stated Goal: home today PT Goal Formulation: All assessment and education complete, DC therapy    Frequency     Barriers to discharge        Co-evaluation               End of Session Equipment Utilized During Treatment: Gait belt Activity Tolerance: Patient tolerated treatment well Patient left: in chair;with call bell/phone within reach;with family/visitor present Nurse Communication: Mobility status    Functional  Assessment Tool Used: clinical judgement Functional Limitation: Mobility: Walking and moving around Mobility: Walking and Moving Around Current Status [id number]): At least 1 percent but less than 20 percent impaired, limited or restricted Mobility: Walking and Moving Around Goal Status [phone]): At least 1 percent but less than 20 percent impaired, limited or restricted Mobility: Walking and Moving Around Discharge Status [phone]): At least 1 percent but less than 20 percent impaired, limited or restricted    Time: [id number] PT Time Calculation (min) (ACUTE ONLY): 16 min   Charges:   PT Evaluation $PT Eval Moderate Complexity: 1 Procedure     PT G Codes:   PT G-Codes **NOT FOR INPATIENT CLASS** Functional Assessment Tool Used: clinical judgement Functional Limitation: Mobility: Walking and moving around Mobility: Walking and Moving Around Current Status [id number]): At least 1 percent but less than 20 percent impaired, limited or restricted Mobility: Walking and Moving Around Goal Status [phone]): At least 1 percent but less than 20 percent impaired, limited or restricted Mobility: Walking and Moving Around Discharge Status [phone]): At least 1 percent but less than 20 percent impaired, limited or restricted    Lorriane Shire 02/17/2015, 11:54 AM

## 2015-02-17 NOTE — Discharge Summary (Signed)
Discharge Summary  [name] XIP:[id number] DOB: 1932/06/02  PCP: [name], MD  Admit date: 02/16/2015 Discharge date: 02/17/2015  Time spent: <57mns  Recommendations for Outpatient Follow-up:  1. F/u with PMD within a week for hospital discharge follow up, pmd to repeat tsh in 4-6 weeks, continue to adjust synthroid dose. 2. F/u with cardiology Dr. HPercival Spanishon 3/27, outpatient heart monitor to be picked up on 2/14.  Discharge Diagnoses:  Active Hospital Problems   Diagnosis Date Noted  . Syncope and collapse 04/28/2007  . Hypotension 02/16/2015  . Bradycardia   . Syncope 02/03/2015  . C. difficile colitis 11/26/2014  . C7 cervical fracture ([place] [facility] 10/12/2013  . RESTLESS LEG SYNDROME 10/16/2008  . H/O Psychosis in past, hospitaluized in '09, in ED Nov 2015 12/15/2007  . Hypothyroidism 04/29/2007  . Irritable bowel syndrome 04/29/2007  . CAD- minor CAD 2006 01/29/2007  . GASTROESOPHAGEAL REFLUX DISEASE 01/29/2007  . Hyperlipidemia 01/12/2007  . Essential hypertension 01/12/2007    Resolved Hospital Problems   Diagnosis Date Noted Date Resolved  No resolved problems to display.    Discharge Condition: stable  Diet recommendation: heart healthy  Filed Weights   02/16/15 1856 02/17/15 0044  Weight: 72.576 kg (160 lb) 73.347 kg (161 lb 11.2 oz)    History of present illness: (per admitting MD Dr. NBlaine Hamper ELaurann MontanaSSchmuckis a 82y.o. female with PMH of hypertension, hyperlipidemia, IBS, Crohn's disease, RLS, syncope, psychosis, bipolar, C. difficile colitis, flutter (s/p of RFA of 2005), dementia, who presents with recurrent syncope.  Patient was recently hospitalized from 01/27-1/31/17 due to syncope and bradycardia, which was likely secondary to vasovagal syncope. Her BB was discontinued. Card recommended outpatient monitor, but has not done yet. Patient reports that she had another episode of syncope when she was a laying on the recliner at home. She felt nauseated and  dizzy, and then passed out for few minutes. Per his husband, patient did not have seizure activity. Patient did not have chest pain, shortness breath, unilateral weakness, vision change or hearing loss. No fever or chills.  In ED, patient was found to have hypotension with blood pressure 68/43, which improved to 125/54 after IV fluid treatment, bradycardia with heart rate 49, WBC 6.6, temperature normal, electrolytes and renal function okay. Patient is admitted to inpatient for further eval and treatment.  Hospital Course:  Principal Problem:   Syncope and collapse Active Problems:   Hypothyroidism   Hyperlipidemia   H/O Psychosis in past, hospitaluized in '09, in ED Nov 2015   RESTLESS LEG SYNDROME   Essential hypertension   CAD- minor CAD 2006   GASTROESOPHAGEAL REFLUX DISEASE   Irritable bowel syndrome   C7 cervical fracture (HCC)   C. difficile colitis   Syncope   Bradycardia   Hypotension  Syncope and collapse: ( resolved) Etiology is not clear. No focal neurologic findings, less likely to have TIA or stroke. Patient has sinus arrhythmia on EKG, hypotension and bradycardia, indicating cardiac etiology, particularly cardiac arrhythmia. Vasovagal is also possible differential diagnosis. Patient does not have chest pain, less likely to have ACS. Patient just had 2-D echo on 01/27/15, which showed EF 65-70 percent with grade 1 diastolic dysfunction, no aortic valve stenosis. Will not repeat a 2-D echo today.  -negative orthostatic vital sign , negative troponin -she is adequately fluids resusitated.  Bradycardia: reported recently started on donepezil, will d/c donepezil. Cardiology consulted, outpatient monitor and cardiology follow up arranged, appreciate cards input.  Hypothyroidism:  Last TSH  was 6.294 on 02/03/15 -suspect confusion, has not been taking some of her meds, continue home dose Synthroid dose 50mg -Check TSH in 4 to 6 weeks by pmd  HLD: Last LDL was 75 on  04/01/13 -Continue home medications: Lipitor  HTN: -Bp med held due to hypotension initially, restarted at discharge.  RLS: -Pramipexole  Crohn's Disease: stable -continue mesalamine  C. difficile colitis:  Complicated Dificid. No diarrhea or abdominal pain -No further treatment needed.  Dementia:  -d/c Donepezil due to bradycardia ( patient reported she was started on donepezil a month ago) -patient at baseline aaox3, but very poor short term memory, not able to perform immediate recall.     Code Status: Full code Family Communication: Yes, patient's husband at bed side Disposition Plan: elderly living with home health/RN/PT and sEducation officer, museum RN to supervise medication.   Discharge Exam: BP 120/70 mmHg  Pulse 96  Temp(Src) 97.8 F (36.6 C) (Oral)  Resp 18  Ht 5' 1"  (1.549 m)  Wt 73.347 kg (161 lb 11.2 oz)  BMI 30.57 kg/m2  SpO2 99%  General: aaox3 Cardiovascular: rrr Respiratory: CATBL Neuro: no focal deficit, does has very poor short term memory,   Discharge Instructions You were cared for by a hospitalist during your hospital stay. If you have any questions about your discharge medications or the care you received while you were in the hospital after you are discharged, you can call the unit and asked to speak with the hospitalist on call if the hospitalist that took care of you is not available. Once you are discharged, your primary care physician will handle any further medical issues. Please note that NO REFILLS for any discharge medications will be authorized once you are discharged, as it is imperative that you return to your primary care physician (or establish a relationship with a primary care physician if you do not have one) for your aftercare needs so that they can reassess your need for medications and monitor your lab values.      Discharge Instructions    Diet - low sodium heart healthy    Complete by:  As directed      Face-to-face encounter  (required for Medicare/Medicaid patients)    Complete by:  As directed   I Elek Holderness certify that this patient is under my care and that I, or a nurse practitioner or physician's assistant working with me, had a face-to-face encounter that meets the physician face-to-face encounter requirements with this patient on 02/17/2015. The encounter with the patient was in whole, or in part for the following medical condition(s) which is the primary reason for home health care (List medical condition): FTT, poor memory, medication supervision.  The encounter with the patient was in whole, or in part, for the following medical condition, which is the primary reason for home health care:  FTT/ poor memory  I certify that, based on my findings, the following services are medically necessary home health services:  Nursing  Reason for Medically Necessary Home Health Services:  Skilled Nursing- Change/Decline in Patient Status  My clinical findings support the need for the above services:  OTHER SEE COMMENTS  Further, I certify that my clinical findings support that this patient is homebound due to:  Mental confusion     Home Health    Complete by:  As directed   To provide the following care/treatments:  RN     Increase activity slowly    Complete by:  As directed  Medication List    STOP taking these medications        donepezil 10 MG tablet  Commonly known as:  ARICEPT     fidaxomicin 200 MG Tabs tablet  Commonly known as:  DIFICID      TAKE these medications        amLODipine 2.5 MG tablet  Commonly known as:  NORVASC  Take 1 tablet (2.5 mg total) by mouth daily.     aspirin EC 81 MG tablet  Take 81 mg by mouth every evening.     atorvastatin 40 MG tablet  Commonly known as:  LIPITOR  TAKE 1 TABLET DAILY     Calcium-Magnesium 500-250 MG Tabs  Take 2 tablets by mouth daily at 6 PM.     fluticasone 50 MCG/ACT nasal spray  Commonly known as:  FLONASE  Place 2 sprays into both  nostrils at bedtime.     levothyroxine 88 MCG tablet  Commonly known as:  SYNTHROID, LEVOTHROID  TAKE 1 TABLET DAILY BEFORE BREAKFAST     mesalamine 1.2 g EC tablet  Commonly known as:  LIALDA  Take 3.6 g by mouth daily with lunch.     pramipexole 0.5 MG tablet  Commonly known as:  MIRAPEX  TAKE 1 TABLET TWICE A DAY (NEED OFFICE VISIT)     Vitamin D3 2000 units Tabs  Take 2,000 Units by mouth daily at 12 noon.       Allergies  Allergen Reactions  . Prednisone Other (See Comments)    Patient had psychiatric episode with hallucinations. Caused 3.5 week long hospitalization & loss of memory   . Carbamazepine Nausea Only and Other (See Comments)    Tegretol; dizziness, blurred vision, nausea, headache, insomnia  . Celebrex [Celecoxib] Other (See Comments)    No relief from pain; ineffective  . Codeine Nausea And Vomiting  . Demerol [Meperidine] Nausea And Vomiting  . Diclofenac Sodium Other (See Comments)    severe headache  . Fentanyl Nausea And Vomiting and Other (See Comments)    IV Fentanyl; caused nausea and vomiting Fentanyl patch; caused chest pain, HBP, with second patch experienced vertigo and nausea  . Fluoxetine Hcl Nausea Only and Other (See Comments)    dizziness, blurred vision, nausea, headache, insomnia  . Hydromorphone Hcl Nausea And Vomiting  . Ibuprofen Other (See Comments)    Caused ulcers and colitis  . Indomethacin Nausea Only and Other (See Comments)    dizziness, blurred vision, nausea, headache, insomnia  . Lisinopril Cough  . Meperidine Hcl Nausea And Vomiting  . Morphine Nausea And Vomiting  . Oruvail [Ketoprofen] Other (See Comments)    No relief from pain; ineffective  . Pregabalin Other (See Comments)    severe restless legs, insomnia  . Propranolol Hcl Nausea Only and Other (See Comments)    dizziness, blurred vision, nausea, headache, insomnia  . Refresh Lacri-Lube [Artificial Tears] Swelling    Caused swelling, redness, hard crust on  eyelids   . Relafen [Nabumetone] Other (See Comments)    No relief from pain; ineffective  . Ropinirole Hcl Other (See Comments)    severe restless legs and insomnia  . Sulfamethoxazole Nausea Only and Other (See Comments)    gas, loose stools  . Bacitracin Hives, Itching and Rash    redness   Follow-up Information    Follow up with [facility] On 02/21/2015.   Specialty:  Cardiology   Why:  @10  am to pick up your heart monitor  Contact information:   [address] [place] [phone]      Follow up with Minus Breeding, MD On 04/03/2015.   Specialty:  Cardiology   Why:  @ 11am   Contact information:   [address] [place] [address] [phone]       Follow up with [name], MD In 1 week.   Specialty:  Family Medicine   Why:  hospital discharge follow up   Contact information:   [place] [address] [phone]       Follow up with [facility]   Why:  HH-RN/PT/sw following and will resume   Contact information:   [address] [place] [address] [phone]        The results of significant diagnostics from this hospitalization (including imaging, microbiology, ancillary and laboratory) are listed below for reference.    Significant Diagnostic Studies: Dg Chest Portable 1 View  02/03/2015  CLINICAL DATA:  Syncope and bradycardia for 1 day EXAM: PORTABLE CHEST 1 VIEW COMPARISON:  September 28, 2014 FINDINGS: There is no edema or consolidation. Heart is upper normal in size with pulmonary vascularity within normal limits. No adenopathy. There is postoperative change in each shoulder and in the lower cervical spine region. IMPRESSION: No edema or consolidation.  No change in cardiac silhouette. Electronically Signed   By: Lowella Grip III M.D.   On: 02/03/2015 15:12    Microbiology: Recent Results (from the past 240 hour(s))   MRSA PCR Screening     Status: None   Collection Time: 02/17/15  8:23 AM  Result Value Ref Range Status   MRSA by PCR NEGATIVE NEGATIVE Final    Comment:        The GeneXpert MRSA Assay (FDA approved for NASAL specimens only), is one component of a comprehensive MRSA colonization surveillance program. It is not intended to diagnose MRSA infection nor to guide or monitor treatment for MRSA infections.      Labs: Basic Metabolic Panel:  Recent Labs Lab 02/16/15 1910 02/17/15 0519  NA 138 141  K 4.3 3.9  CL 103 107  CO2 23 25  GLUCOSE 162* 89  BUN 20 16  CREATININE 0.87 0.58  CALCIUM 9.3 8.9   Liver Function Tests:  Recent Labs Lab 02/17/15 0519  AST 18  ALT 16  ALKPHOS 57  BILITOT 0.9  PROT 5.8*  ALBUMIN 3.1*   No results for input(s): LIPASE, AMYLASE in the last 168 hours. No results for input(s): AMMONIA in the last 168 hours. CBC:  Recent Labs Lab 02/16/15 1910 02/17/15 0519  WBC 6.6 5.4  HGB 12.6 12.0  HCT 36.9 36.6  MCV 95.3 95.1  PLT 165 143*   Cardiac Enzymes:  Recent Labs Lab 02/17/15 0001 02/17/15 0519 02/17/15 1033  TROPONINI <0.03 <0.03 <0.03   BNP: BNP (last 3 results)  Recent Labs  02/03/15 1518  BNP 109.2*    ProBNP (last 3 results) No results for input(s): PROBNP in the last 8760 hours.  CBG:  Recent Labs Lab 02/16/15 1907  GLUCAP 148*       SignedFlorencia Reasons MD, PhD  [facility] 02/17/2015, 2:40 PM

## 2015-02-17 NOTE — Progress Notes (Signed)
Patient admitted to room, husband at bedside.  Oriented to room and equipment, advised to call for help before getting up. Telemetry box verified with central monitoring. Call light within reach.

## 2015-02-17 NOTE — Care Management Note (Signed)
Case Management Note Marvetta Gibbons RN, BSN Unit 2W-Case Manager [phone]  Patient Details  Name: TIEESHA WOFFORD MRN: [id number] Date of Birth: 06/13/1932  Subjective/Objective:   Pt admitted with syncope                 Action/Plan: PTA pt lived at [place]- with spouse- pt active with [facility] for HH-RN/PT/SW- [facility] will resume services at discharge does need resumption orders as pt is observation status. Spoke with Manuela Schwartz at [facility] who is aware that pt will d/c back home today.   Expected Discharge Date:   02/17/15               Expected Discharge Plan:  [place]  In-House Referral:     Discharge planning Services  CM Consult  Post Acute Care Choice:  Resumption of Svcs/PTA Provider, Home Health Choice offered to:     DME Arranged:    DME Agency:     HH Arranged:  RN, PT, Social Work [employer] Agency:  [facility]  Status of Service:  Completed, signed off  Medicare Important Message Given:    Date Medicare IM Given:    Medicare IM give by:    Date Additional Medicare IM Given:    Additional Medicare Important Message give by:     If discussed at [place] of Stay Meetings, dates discussed:    Additional Comments:  Dawayne Patricia, RN 02/17/2015, 2:28 PM

## 2015-02-17 NOTE — Progress Notes (Signed)
02/17/2015 1600 Discharge AVS meds taken today and those due this evening reviewed.  Follow-up appointments and when to call md reviewed.  D/C IV and TELE.  Questions and concerns addressed.   D/C home per orders. [place]

## 2015-02-17 NOTE — Progress Notes (Signed)
[facility]  Patient Status: Active (receiving services up to time of hospitalization)  [facility] is providing the following services: RN and MSW  If patient discharges after hours, please call [phone].   [name] 02/17/2015, 11:22 AM

## 2015-02-20 ENCOUNTER — Telehealth: Payer: Self-pay | Admitting: Family Medicine

## 2015-02-20 DIAGNOSIS — I2510 Atherosclerotic heart disease of native coronary artery without angina pectoris: Secondary | ICD-10-CM | POA: Diagnosis not present

## 2015-02-20 DIAGNOSIS — K5790 Diverticulosis of intestine, part unspecified, without perforation or abscess without bleeding: Secondary | ICD-10-CM | POA: Diagnosis not present

## 2015-02-20 DIAGNOSIS — K589 Irritable bowel syndrome without diarrhea: Secondary | ICD-10-CM | POA: Diagnosis not present

## 2015-02-20 DIAGNOSIS — I10 Essential (primary) hypertension: Secondary | ICD-10-CM | POA: Diagnosis not present

## 2015-02-20 DIAGNOSIS — K5090 Crohn's disease, unspecified, without complications: Secondary | ICD-10-CM | POA: Diagnosis not present

## 2015-02-20 DIAGNOSIS — F0390 Unspecified dementia without behavioral disturbance: Secondary | ICD-10-CM | POA: Diagnosis not present

## 2015-02-20 NOTE — Telephone Encounter (Signed)
Pt has a hospital follow up on 02/2015. [name] states that Shona's has runny nose, watery eyes, productive cough with yellow sputum. Please advise.

## 2015-02-20 NOTE — Telephone Encounter (Signed)
Most likely viral.  Monitor for fever.  IF she develops any fever or increased shortness of breath be in touch, otherwise treat symptomatically.

## 2015-02-20 NOTE — Telephone Encounter (Signed)
Aaron Edelman from [place] call to say pt is having some upper respiratory issues. [name] sad pt is coughing up phelm, nose running, nasal congestion. Said she listen to her lungs and they seem clear    Pharmacy [facility] [address]

## 2015-02-20 NOTE — Telephone Encounter (Signed)
[name] is aware and agreeable of annotations.

## 2015-02-21 ENCOUNTER — Ambulatory Visit (INDEPENDENT_AMBULATORY_CARE_PROVIDER_SITE_OTHER): Payer: Medicare Other

## 2015-02-21 ENCOUNTER — Telehealth: Payer: Self-pay | Admitting: Family Medicine

## 2015-02-21 DIAGNOSIS — R55 Syncope and collapse: Secondary | ICD-10-CM | POA: Diagnosis not present

## 2015-02-21 NOTE — Progress Notes (Unsigned)
Monitor placement for syncope eval. Per Dr. Gwenlyn Found in hospital. Pt to see Dr. Percival Spanish for follow up in March.

## 2015-02-21 NOTE — Patient Instructions (Signed)
Cardiac Event Monitoring °A cardiac event monitor is a small recording device used to help detect abnormal heart rhythms (arrhythmias). The monitor is used to record heart rhythm when noticeable symptoms such as the following occur: °· Fast heartbeats (palpitations), such as heart racing or fluttering. °· Dizziness. °· Fainting or light-headedness. °· Unexplained weakness. °The monitor is wired to two electrodes placed on your chest. Electrodes are flat, sticky disks that attach to your skin. The monitor can be worn for up to 30 days. You will wear the monitor at all times, except when bathing.  °HOW TO USE YOUR CARDIAC EVENT MONITOR °A technician will prepare your chest for the electrode placement. The technician will show you how to place the electrodes, how to work the monitor, and how to replace the batteries. Take time to practice using the monitor before you leave the office. Make sure you understand how to send the information from the monitor to your health care provider. This requires a telephone with a landline, not a cell phone. You need to: °· Wear your monitor at all times, except when you are in water: °¨ Do not get the monitor wet. °¨ Take the monitor off when bathing. Do not swim or use a hot tub with it on. °· Keep your skin clean. Do not put body lotion or moisturizer on your chest. °· Change the electrodes daily or any time they stop sticking to your skin. You might need to use tape to keep them on. °· It is possible that your skin under the electrodes could become irritated. To keep this from happening, try to put the electrodes in slightly different places on your chest. However, they must remain in the area under your left breast and in the upper right section of your chest. °· Make sure the monitor is safely clipped to your clothing or in a location close to your body that your health care provider recommends. °· Press the button to record when you feel symptoms of heart trouble, such as  dizziness, weakness, light-headedness, palpitations, thumping, shortness of breath, unexplained weakness, or a fluttering or racing heart. The monitor is always on and records what happened slightly before you pressed the button, so do not worry about being too late to get good information. °· Keep a diary of your activities, such as walking, doing chores, and taking medicine. It is especially important to note what you were doing when you pushed the button to record your symptoms. This will help your health care provider determine what might be contributing to your symptoms. The information stored in your monitor will be reviewed by your health care provider alongside your diary entries. °· Send the recorded information as recommended by your health care provider. It is important to understand that it will take some time for your health care provider to process the results. °· Change the batteries as recommended by your health care provider. °SEEK IMMEDIATE MEDICAL CARE IF:  °· You have chest pain. °· You have extreme difficulty breathing or shortness of breath. °· You develop a very fast heartbeat that persists. °· You develop dizziness that does not go away. °· You faint or constantly feel you are about to faint. °  °This information is not intended to replace advice given to you by your health care provider. Make sure you discuss any questions you have with your health care provider. °  °Document Released: 10/03/2007 Document Revised: 01/14/2014 Document Reviewed: 06/22/2012 °Elsevier Interactive Patient Education ©2016 Elsevier Inc. ° °

## 2015-02-21 NOTE — Telephone Encounter (Signed)
Please advise 

## 2015-02-21 NOTE — Telephone Encounter (Signed)
Bettey Costa notified of Dr. Erick Blinks comments. Verbalized understanding.

## 2015-02-21 NOTE — Telephone Encounter (Signed)
Dr. [name] addressed this yesterday. Home Care is suppose to treat her symptoms with OTC medications and to keep [place] posted if she develops a fever. Please call and let them know. Thank you.

## 2015-02-21 NOTE — Telephone Encounter (Signed)
[facility] - Client [facility] Patient Name: [name] DOB: 09/09/1932 Initial Comment Caller states [name], w/[employer], home health aide, pt has cough and what should get? has productive cough w/ yellow mucus; Nurse Assessment Nurse: Markus Daft, RN, [place] Date/Time (Eastern Time): 02/21/2015 12:10:48 PM Confirm and document reason for call. If symptomatic, describe symptoms. You must click the next button to save text entered. ---[name], w/ [facility], home health aide to make sure that she takes her medicine TID, -- [phone] - states that pt has had a productive cough with yellow mucous. Runny nose. Started 4-5 days ago. No fever. Denies body aches or chills, SOB. Has the patient traveled out of the country within the last 30 days? ---Not Applicable Does the patient have any new or worsening symptoms? ---Yes Will a triage be completed? ---Yes Related visit to physician within the last 2 weeks? ---No Does the PT have any chronic conditions? (i.e. diabetes, asthma, etc.) ---Yes List chronic conditions. ---HTN Is this a behavioral health or substance abuse call? ---No Guidelines Guideline Title Affirmed Question Affirmed Notes Cough - Acute Productive Cough with cold symptoms (e.g., runny nose, postnasal drip, throat clearing) (all triage questions negative) Final Disposition User [place], RN, [place] Disagree/Comply: Comply

## 2015-02-22 ENCOUNTER — Telehealth: Payer: Self-pay | Admitting: Cardiology

## 2015-02-22 DIAGNOSIS — K5790 Diverticulosis of intestine, part unspecified, without perforation or abscess without bleeding: Secondary | ICD-10-CM | POA: Diagnosis not present

## 2015-02-22 DIAGNOSIS — I2510 Atherosclerotic heart disease of native coronary artery without angina pectoris: Secondary | ICD-10-CM | POA: Diagnosis not present

## 2015-02-22 DIAGNOSIS — F0390 Unspecified dementia without behavioral disturbance: Secondary | ICD-10-CM | POA: Diagnosis not present

## 2015-02-22 DIAGNOSIS — K5090 Crohn's disease, unspecified, without complications: Secondary | ICD-10-CM | POA: Diagnosis not present

## 2015-02-22 DIAGNOSIS — K589 Irritable bowel syndrome without diarrhea: Secondary | ICD-10-CM | POA: Diagnosis not present

## 2015-02-22 DIAGNOSIS — I10 Essential (primary) hypertension: Secondary | ICD-10-CM | POA: Diagnosis not present

## 2015-02-22 NOTE — Telephone Encounter (Signed)
Initial call not transferred to triage RN.  Returned call to [employer] for alert. They informed me of a notification for new onset of A Fib. This occurred at 3:11am avg 100 bpm. [facility] is faxing a report to our office.  Pt was eval'd for syncope in hospital and set up for 30-day event monitoring on discharge. I saw her yesterday to set up equipment.  Pt is scheduled to follow up w/ Dr. Percival Spanish post-monitor in 6 weeks.  Pt currently not on anticoagulation Routed to Dr. Percival Spanish for recommendations.

## 2015-02-22 NOTE — Progress Notes (Signed)
This encounter was created in error - please disregard.

## 2015-02-22 NOTE — Telephone Encounter (Signed)
I spoke w/ Dr. Percival Spanish who reviewed and signed alert strip.  Dr. Rosezella [place] recommendation was to have pt seen tomorrow on flex calendar.   I called patient at home and alerted her to monitor findings, physician recommendation. She voiced understanding. I did give her option of being seen today, but she has transportation limitations and prior conflict. Was amenable to a flex provider visit tomorrow at [facility] to see Kathlene November.  Will route to provider for review. To add: I also spoke w/ patient's [place] who does pt's meds at home, among other things. They are trying to work on a pharmacy change to [facility], who offer med dispense to weekly pill boxes. She wanted to make sure we are aware they are working on the pharmacy changeover today.

## 2015-02-23 ENCOUNTER — Encounter: Payer: Medicare Other | Admitting: Physician Assistant

## 2015-02-23 NOTE — Progress Notes (Signed)
Cardiology Office Note   Date:  02/27/2015   ID:  [name], [name] 08/15/1932, MRN [id number]   PCP: Rebekah Post, MD Cardiologist: Dr. Percival Hayes   New onset afib Hayes on 30 day monitor for syncope.   History of Present Illness: [name] is a 82 y.o. 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 She was sent home on a 30 day event monitor which revealed new onset afib. She was added onto the [facility] for evaluation and treatment  She has a history of non obstructive CAD with cardiac catheterization in 2006 which demonstrated only 30% LAD stenosis. She was last seen in the office by Dr. Jobe Hayes in 10/2011 and felt to be doing well.  She was hospitalized from 01/27-1/31/17 due to syncope and bradycardia (HR in 30-40s), which was likely secondary to vasovagal syncope. Her BB was discontinued and the bradycardia resolved without significant rebound. Dr. Claiborne Hayes recommended an outpatient monitor but this was never set up. 2-D echo during this admission showed normal EF 65-70%, no RWMAs or significant valvular disease.  She was then readmitted from 2/9-2/10/17 for recurrent syncope. Dr. Gwenlyn Hayes and myself saw the patient in consult. Syncope felt to be related to vasovagal episode. Initially she was hypotensive and bradycardic and improved with IVFs. She was sent home with outpatient monitor.   [facility] alerted out office that she went into afib on 02/22/15 at 3 am. Average HR 100s. Strips confirmed by Dr. Percival Hayes and added onto [facility] for evaluation..   Today she presents for follow up. When they called her to tell her that her heart was out of rhythm she was asymptomatic. She continues to wear the cardiac event monitor, although she is not is happy about this  and wants to take it off now. It was placed on 02/21/15. She would like to take it off by 03/22/15 which is her 52th wedding anniversary. I think that is okay to take off then. She denies CP or SOB. No further episodes of dizziness or syncope. No LE edema, orthopnea or PND.     Past Medical History  Diagnosis Date  . OSA (obstructive sleep apnea)   . History of bronchitis   . Other diseases of lung, not elsewhere classified   . Hypertension   . CAD (coronary artery disease)     LAD 30% 2006  . History of atrial flutter   . Cerebrovascular disease   . Hyperlipemia   . Obesity   . GERD (gastroesophageal reflux disease)   . Diverticulosis of colon   . IBS (irritable bowel syndrome)   . Colonic polyp   . Crohn's disease ([place])   . Hypothyroidism   . History of hyperparathyroidism   . Fibromyalgia   . Osteopenia   . History of headache   . Syncope   . Restless legs syndrome (RLS)   . Psychosis   . History of tuberculosis   . Radiation 05/2008    5000 cGy to left lower eyelid, parotid lymphatics and upper neck  . Memory disturbance   . Bipolar affective ([place])     History of psychosis  . 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 C. difficile diarrhea     Past Surgical History  Procedure Laterality Date  . Parathyroid  adenoma surgery    . Cholecystectomy    . Moh';s surg left lower eye lid surgery and lid reconstruction surgery  2/10  . Bladder suspension    . Cataract extraction Bilateral   . Appendectomy    . Tonsillectomy and adenoidectomy    . Lumbar laminectomy    . Incision and drainage perirectal abscess    . Heel spur excision    . Nasal sinus surgery Right     Right maxillary  . Eye lid surgery  04/04/2011    [facility] by Dr. Norlene Duel  . Posterior cervical fusion/foraminotomy N/A 10/12/2013    Procedure: Cervical five-Thoracic two cervico-thoracic posterior fusion;  Surgeon: [name], MD;  Location: [facility];  Service: Neurosurgery;  Laterality: N/A;      Current Outpatient Prescriptions  Medication Sig Dispense Refill  . amLODipine (NORVASC) 2.5 MG tablet Take 1 tablet (2.5 mg total) by mouth daily. 30 tablet 6  . atorvastatin (LIPITOR) 40 MG tablet Take 40 mg by mouth daily.    . Calcium-Magnesium 500-250 MG TABS Take 2 tablets by mouth daily at 6 PM.     . Cholecalciferol (VITAMIN D3) 2000 UNITS TABS Take 2,000 Units by mouth daily at 12 noon.     . fluticasone (FLONASE) 50 MCG/ACT nasal spray Place 2 sprays into both nostrils at bedtime.    Marland Kitchen levothyroxine (SYNTHROID, LEVOTHROID) 88 MCG tablet Take 88 mcg by mouth daily before breakfast.    . mesalamine (LIALDA) 1.2 G EC tablet Take 3.6 g by mouth daily with lunch.     . pramipexole (MIRAPEX) 0.5 MG tablet Take 0.5 mg by mouth 2 (two) times daily.    . traMADol (ULTRAM) 50 MG tablet Take 50 mg by mouth every 6 (six) hours as needed for moderate pain or severe pain.    . rivaroxaban (XARELTO) 20 MG TABS tablet Take 1 tablet (20 mg total) by mouth daily with supper. 30 tablet 11   No current facility-administered medications for this visit.    Allergies:   Prednisone; Carbamazepine; Celebrex; Codeine; Demerol; Diclofenac sodium; Fentanyl; Fluoxetine hcl; Hydromorphone hcl; Ibuprofen; Indomethacin; Lisinopril; Meperidine hcl; Morphine; Oruvail; Pregabalin; Propranolol hcl; Refresh lacri-lube; Relafen; Ropinirole hcl; Sulfamethoxazole; and Bacitracin    Social History:  The patient  reports that she quit smoking about 55 years ago. Her smoking use included Cigarettes. She quit after 6 years of use. She has never used smokeless tobacco. She reports that she does not drink alcohol or use illicit drugs.   Family History:  The patient's family history includes Arthritis in her mother; Colon cancer in her maternal grandmother; Heart failure in her father; Hypertension in her mother; Ulcers in her mother.    ROS:  Please see the history of present illness.   Otherwise, review of systems are  positive for none.   All other systems are reviewed and negative.    PHYSICAL EXAM: VS:  BP 140/80 mmHg  Pulse 96  Ht 5\' 1"  (1.549 m)  Wt 163 lb (73.936 kg)  BMI 30.81 kg/m2 , BMI Body mass index is 30.81 kg/(m^2). GEN: Well nourished, well developed, in no acute distressobese HEENT: normal Neck: no JVD, carotid bruits, or masses Cardiac: RRR; no murmurs, rubs, or gallops,no edema  Respiratory:  clear to auscultation bilaterally, normal work of breathing GI: soft, nontender, nondistended, + BS MS: no deformity or atrophy Skin: warm and dry, no rash Neuro:  Strength and sensation are intact Psych: euthymic mood, full affect   EKG:  EKG is ordered today. The ekg ordered today demonstrates NSR   Recent Labs: 02/03/2015: B Natriuretic Peptide 109.2*; Magnesium 2.4; TSH 6.294* 02/17/2015: ALT 16; BUN 16; Creatinine, Ser 0.58; Hemoglobin 12.0; Platelets 143*; Potassium 3.9; Sodium 141    Lipid Panel    Component Value Date/Time   CHOL 183 04/01/2013 1051   TRIG 130.0 04/01/2013 1051   HDL 82.50 04/01/2013 1051   CHOLHDL 2 04/01/2013 1051   VLDL 26.0 04/01/2013 1051   LDLCALC 75 04/01/2013 1051   LDLDIRECT 188.6 07/13/2012 0820      Wt Readings from Last 3 Encounters:  02/27/15 163 lb (73.936 kg)  02/24/15 162 lb (73.483 kg)  02/17/15 161 lb 11.2 oz (73.347 kg)     Other studies Reviewed: Additional studies/ records that were reviewed today include: 2D ECHO, cath ( see HPI). Review of the above records demonstrates:   Echo: 02/04/2015 LV EF: 65% -  70% Study Conclusions - Left ventricle: The cavity size was normal. Systolic function was vigorous. The estimated ejection fraction was in the range of 65% to 70%. Wall motion was normal; there were no regional wall motion abnormalities. Doppler parameters are consistent with abnormal left ventricular relaxation (grade 1 diastolic dysfunction). There was no evidence of elevated ventricular filling pressure  by Doppler parameters. - Aortic valve: There was no regurgitation. - Aortic root: The aortic root was normal in size. - Mitral valve: Structurally normal valve. There was mild regurgitation. - Left atrium: The atrium was mildly dilated. - Right ventricle: Systolic function was normal. - Right atrium: The atrium was normal in size. - Tricuspid valve: There was mild regurgitation. - Pulmonic valve: There was no regurgitation. - Pulmonary arteries: Systolic pressure was at the upper limits of normal. PA peak pressure: 32 mm Hg (S). - Inferior vena cava: The vessel was normal in size. - Pericardium, extracardiac: There was no pericardial effusion.   ASSESSMENT AND PLAN: SHAKIYAH LEUER is a 82 y.o. 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 She was sent home on a 30 day event monitor which revealed new onset afib. She was added onto the [facility] for evaluation and treatment.   New onset atrial fibrillation: noted on 30 day monitor. She does have a history of atrial flutter s/p ablation in 2005. New diagnosis of afib. CHADSVASC score of at least 6 (HTN 1, age 44, CVA 2, F sex 1). She will require long term AC. No history of bleeding. I have given her samples and started her on Xarelto 20mg  daily. I have advised her to stop taking ASA now that she has been started on Xarelto. We have to be careful with AV nodal blocking agents as she recently was taken off her BB due to bradycardia. Will await full read of cardiac monitor to see her afib burden and baseline HR and defer to Dr. Percival Hayes.   Recurrent syncope: associated with nausea and dizziness most consistent with vasovagal syncope. Likely not related to afib. Continue monitor for full 30 days.  HTN: BP well controlled on current  regimen.   Bradycardia: improved off beta blocker.  Current medicines are reviewed at length with the patient today.  The patient does not have concerns regarding medicines.  The following changes have been made:  Start Xarelto 20mg  daily.   Labs/ tests  ordered today include:   Orders Placed This Encounter  Procedures  . EKG 12-Lead     Disposition:   FU with Dr. Percival Hayes in 6-8 weeks after monitor.   Renea Ee  02/27/2015 10:51 AM    [facility] [address], [place], [place]  [address] Phone: [phone]; Fax: [phone]

## 2015-02-23 NOTE — Telephone Encounter (Signed)
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 If any new prescriptions or refills need to be called in,they need to go to [facility] [phone].

## 2015-02-23 NOTE — Telephone Encounter (Signed)
Pt preferred pharmacy updated for all future refills

## 2015-02-24 ENCOUNTER — Telehealth: Payer: Self-pay | Admitting: Family Medicine

## 2015-02-24 ENCOUNTER — Ambulatory Visit (INDEPENDENT_AMBULATORY_CARE_PROVIDER_SITE_OTHER): Payer: Medicare Other | Admitting: Family Medicine

## 2015-02-24 VITALS — BP 108/70 | HR 97 | Temp 98.2°F | Ht 61.0 in | Wt 162.0 lb

## 2015-02-24 DIAGNOSIS — E038 Other specified hypothyroidism: Secondary | ICD-10-CM

## 2015-02-24 DIAGNOSIS — F0390 Unspecified dementia without behavioral disturbance: Secondary | ICD-10-CM | POA: Diagnosis not present

## 2015-02-24 DIAGNOSIS — R55 Syncope and collapse: Secondary | ICD-10-CM

## 2015-02-24 NOTE — Progress Notes (Signed)
Pre visit review using our clinic review tool, if applicable. No additional management support is needed unless otherwise documented below in the visit note. 

## 2015-02-24 NOTE — Telephone Encounter (Signed)
Pt was seen today for hospital follow up. Pt forgot to tell md she has a cough for over 10 days and needs something send to [facility]

## 2015-02-24 NOTE — Progress Notes (Signed)
Subjective:    Patient ID: [name], female    DOB: December 14, 1932, 82 y.o.   MRN: [id number]  HPI Patient seen for hospital follow-up. She's had a couple of admissions recently for syncope. Initial episode felt to be secondary to vasovagal syncope and bradycardia and beta blocker held. She was seen in follow-up and we did set up an event monitor but this was not of course done immediately  As she was in process of getting this set up she had another episode of syncope was then readmitted for one day on February 9. She reportedly was laying in recliner at home and felt nauseous and dizzy followed by syncope. No observed seizure activity. No reported fevers or chills. In ED she was noted to have blood pressure 68/43 initially with heart rate of 49. Other vitals stable. Electrolytes and other labs were unremarkable.  She had recent echocardiogram which showed EF of 65 to 70% with no major valve issues. There was a note of stopping her donepezil secondary to risk of AV node block but she still had this on her medication list at discharge and has apparently still been taking this. She's not had any further syncope or dizziness since discharge. She has seen cardiology.  She has outpatient event monitor in place and reportedly had transient atrial fibrillation on 02/22/2015. She does have reported history of atrial flutter back in 2005 with previous ablation.  Past Medical History  Diagnosis Date  . OSA (obstructive sleep apnea)   . History of bronchitis   . Other diseases of lung, not elsewhere classified   . Hypertension   . CAD (coronary artery disease)     LAD 30% 2006  . History of atrial flutter   . Cerebrovascular disease   . Hyperlipemia   . Obesity   . GERD (gastroesophageal reflux disease)   . Diverticulosis of colon   . IBS (irritable bowel syndrome)   . Colonic polyp   . Crohn's disease ([place])   . Hypothyroidism   . History of hyperparathyroidism   . Fibromyalgia   .  Osteopenia   . History of headache   . Syncope   . Restless legs syndrome (RLS)   . Psychosis   . History of tuberculosis   . Radiation 05/2008    5000 cGy to left lower eyelid, parotid lymphatics and upper neck  . Memory disturbance   . Bipolar affective ([place])     History of psychosis  . 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 C. difficile diarrhea    Past Surgical History  Procedure Laterality Date  . Parathyroid adenoma surgery    . Cholecystectomy    . Moh';s surg left lower eye lid surgery and lid reconstruction surgery  2/10  . Bladder suspension    . Cataract extraction Bilateral   . Appendectomy    . Tonsillectomy and adenoidectomy    . Lumbar laminectomy    . Incision and drainage perirectal abscess    . Heel spur excision    . Nasal sinus surgery Right     Right maxillary  . Eye lid surgery  04/04/2011    [facility] by Dr. Norlene Duel  . Posterior cervical fusion/foraminotomy N/A 10/12/2013    Procedure: Cervical five-Thoracic two cervico-thoracic posterior fusion;  Surgeon: [name], MD;  Location: [facility];  Service: Neurosurgery;  Laterality: N/A;    reports that she quit smoking about 55 years ago. Her smoking use included Cigarettes.  She quit after 6 years of use. She has never used smokeless tobacco. She reports that she does not drink alcohol or use illicit drugs. family history includes Arthritis in her mother; Colon cancer in her maternal grandmother; Heart failure in her father; Hypertension in her mother; Ulcers in her mother. Allergies  Allergen Reactions  . Prednisone Other (See Comments)    Patient had psychiatric episode with hallucinations. Caused 3.5 week long hospitalization & loss of memory   . Carbamazepine Nausea Only and Other (See Comments)    Tegretol; dizziness, blurred vision, nausea, headache, insomnia  . Celebrex [Celecoxib] Other (See Comments)    No relief from pain; ineffective  . Codeine Nausea And Vomiting  . Demerol  [Meperidine] Nausea And Vomiting  . Diclofenac Sodium Other (See Comments)    severe headache  . Fentanyl Nausea And Vomiting and Other (See Comments)    IV Fentanyl; caused nausea and vomiting Fentanyl patch; caused chest pain, HBP, with second patch experienced vertigo and nausea  . Fluoxetine Hcl Nausea Only and Other (See Comments)    dizziness, blurred vision, nausea, headache, insomnia  . Hydromorphone Hcl Nausea And Vomiting  . Ibuprofen Other (See Comments)    Caused ulcers and colitis  . Indomethacin Nausea Only and Other (See Comments)    dizziness, blurred vision, nausea, headache, insomnia  . Lisinopril Cough  . Meperidine Hcl Nausea And Vomiting  . Morphine Nausea And Vomiting  . Oruvail [Ketoprofen] Other (See Comments)    No relief from pain; ineffective  . Pregabalin Other (See Comments)    severe restless legs, insomnia  . Propranolol Hcl Nausea Only and Other (See Comments)    dizziness, blurred vision, nausea, headache, insomnia  . Refresh Lacri-Lube [Artificial Tears] Swelling    Caused swelling, redness, hard crust on eyelids   . Relafen [Nabumetone] Other (See Comments)    No relief from pain; ineffective  . Ropinirole Hcl Other (See Comments)    severe restless legs and insomnia  . Sulfamethoxazole Nausea Only and Other (See Comments)    gas, loose stools  . Bacitracin Hives, Itching and Rash    redness      Review of Systems  Constitutional: Negative for fatigue.  Eyes: Negative for visual disturbance.  Respiratory: Negative for cough, chest tightness, shortness of breath and wheezing.   Cardiovascular: Negative for chest pain, palpitations and leg swelling.  Neurological: Negative for dizziness, seizures, syncope, weakness, light-headedness and headaches.       Objective:   Physical Exam  Constitutional: She appears well-developed and well-nourished.  Neck: Neck supple. No JVD present.  Cardiovascular: Normal rate.   She has what sounds like  occasional premature beat but heart rate is 76 and mostly regular  Pulmonary/Chest: Effort normal and breath sounds normal. No respiratory distress. She has no wheezes. She has no rales.  Musculoskeletal: She exhibits no edema.  Neurological: She is alert.          Assessment & Plan:  #1 recurrent syncope. Differential is vasovagal versus cardiac arrhythmia. Reportedly transient atrial fibrillation on monitor 2 days ago. Event monitor still in place. She is not bradycardic or hypotensive at this point. Repeat blood pressure by me left arm standing 140/70 We have encouraged her to stop Aricept which can cause AV node dysfunction and continue to hold beta blocker. She has follow-up with cardiology in March.  #2 history of recurrent atrial fibrillation. Followed by cardiology. May need anticoagulation  #3 dementia. We had several concerns regarding safety  and we currently have sitter Monday through Friday 8-5 as well as someone overseeing their medications. We've encouraged no driving.  Adult Protective Services has been involved secondary to our safety concerns regarding her and her husbands dementia.   #4 recent abnormal TSH. Possibly related to poor compliance. Recheck TSH in one month. They do have someone overseeing medication at this point which should help

## 2015-02-24 NOTE — Telephone Encounter (Signed)
Pt is aware to treat her systems. If fever or worsening sx occur then she will call back.

## 2015-02-24 NOTE — Patient Instructions (Signed)
STOP the Donepezil (Aricept).   Continue with all of your other usual medications.

## 2015-02-27 ENCOUNTER — Ambulatory Visit (INDEPENDENT_AMBULATORY_CARE_PROVIDER_SITE_OTHER): Payer: Medicare Other | Admitting: Physician Assistant

## 2015-02-27 ENCOUNTER — Telehealth: Payer: Self-pay | Admitting: Family Medicine

## 2015-02-27 ENCOUNTER — Encounter: Payer: Self-pay | Admitting: Physician Assistant

## 2015-02-27 VITALS — BP 140/80 | HR 96 | Ht 61.0 in | Wt 163.0 lb

## 2015-02-27 DIAGNOSIS — I4891 Unspecified atrial fibrillation: Secondary | ICD-10-CM | POA: Diagnosis not present

## 2015-02-27 DIAGNOSIS — I480 Paroxysmal atrial fibrillation: Secondary | ICD-10-CM

## 2015-02-27 MED ORDER — RIVAROXABAN 20 MG PO TABS: 20.0000 mg | ORAL_TABLET | Freq: Every day | ORAL | Status: DC

## 2015-02-27 NOTE — Telephone Encounter (Signed)
Pattie wilson nurse from [facility]  asked if all patient's prescriptions can be sent to [facility] with refills Pattie # [phone] if any questions [place] the Pharmcist # [phone]  Fax # [phone]

## 2015-02-27 NOTE — Telephone Encounter (Signed)
Okay to refill meds 

## 2015-02-27 NOTE — Telephone Encounter (Signed)
Refills Ok.

## 2015-02-27 NOTE — Patient Instructions (Addendum)
Medication Instructions:  Your physician has recommended you make the following change in your medication:  1) STOP Aspirin 2) START Xarelto 20mg  daily. An Rx has been sent to your pharmacy  Labwork: None ordered  Testing/Procedures: None ordered  Follow-Up: Your physician recommends that you schedule a follow-up appointment in: 6-8 weeks with Dr.Hochrein   Any Other Special Instructions Will Be Listed Below (If Applicable).     If you need a refill on your cardiac medications before your next appointment, please call your pharmacy.

## 2015-02-28 ENCOUNTER — Telehealth: Payer: Self-pay | Admitting: Family Medicine

## 2015-02-28 DIAGNOSIS — I2510 Atherosclerotic heart disease of native coronary artery without angina pectoris: Secondary | ICD-10-CM | POA: Diagnosis not present

## 2015-02-28 DIAGNOSIS — I10 Essential (primary) hypertension: Secondary | ICD-10-CM | POA: Diagnosis not present

## 2015-02-28 DIAGNOSIS — K5090 Crohn's disease, unspecified, without complications: Secondary | ICD-10-CM | POA: Diagnosis not present

## 2015-02-28 DIAGNOSIS — F0390 Unspecified dementia without behavioral disturbance: Secondary | ICD-10-CM | POA: Diagnosis not present

## 2015-02-28 DIAGNOSIS — K589 Irritable bowel syndrome without diarrhea: Secondary | ICD-10-CM | POA: Diagnosis not present

## 2015-02-28 DIAGNOSIS — K5790 Diverticulosis of intestine, part unspecified, without perforation or abscess without bleeding: Secondary | ICD-10-CM | POA: Diagnosis not present

## 2015-02-28 MED ORDER — CALCIUM-MAGNESIUM 500-250 MG PO TABS: 2.0000 | ORAL_TABLET | Freq: Every day | ORAL | Status: DC

## 2015-02-28 MED ORDER — VITAMIN D3 50 MCG (2000 UT) PO TABS
2000.0000 [IU] | ORAL_TABLET | Freq: Every day | ORAL | Status: DC
Start: 2015-02-28 — End: 2016-03-14

## 2015-02-28 MED ORDER — LEVOTHYROXINE SODIUM 88 MCG PO TABS: 88.0000 ug | ORAL_TABLET | Freq: Every day | ORAL | Status: DC

## 2015-02-28 MED ORDER — ATORVASTATIN CALCIUM 40 MG PO TABS: 40.0000 mg | ORAL_TABLET | Freq: Every day | ORAL | Status: DC

## 2015-02-28 MED ORDER — PRAMIPEXOLE DIHYDROCHLORIDE 0.5 MG PO TABS: 0.5000 mg | ORAL_TABLET | Freq: Two times a day (BID) | ORAL | Status: DC

## 2015-02-28 MED ORDER — RIVAROXABAN 20 MG PO TABS: 20.0000 mg | ORAL_TABLET | Freq: Every day | ORAL | Status: DC

## 2015-02-28 MED ORDER — MESALAMINE 1.2 G PO TBEC: 3.6000 g | DELAYED_RELEASE_TABLET | Freq: Every day | ORAL | Status: DC

## 2015-02-28 MED ORDER — AMLODIPINE BESYLATE 2.5 MG PO TABS: 2.5000 mg | ORAL_TABLET | Freq: Every day | ORAL | Status: DC

## 2015-02-28 MED ORDER — FLUTICASONE PROPIONATE 50 MCG/ACT NA SUSP
2.0000 | Freq: Every day | NASAL | Status: DC
Start: 2015-02-28 — End: 2017-05-22

## 2015-02-28 MED ORDER — MELATONIN 10 MG PO TABS: 10.0000 mg | ORAL_TABLET | ORAL | Status: DC

## 2015-02-28 NOTE — Telephone Encounter (Signed)
[name] is aware that pt can take medication.

## 2015-02-28 NOTE — Telephone Encounter (Signed)
OKAY? If so Ill up date medication list.

## 2015-02-28 NOTE — Telephone Encounter (Signed)
yes

## 2015-02-28 NOTE — Telephone Encounter (Signed)
Medications sent in to [facility].

## 2015-02-28 NOTE — Telephone Encounter (Signed)
[name] from [facility] call to say pt purchase MELATONIN 101m to help her sleep. [name] call to ask if it is ok for her to take this med    [phone]

## 2015-03-07 ENCOUNTER — Telehealth: Payer: Self-pay | Admitting: Family Medicine

## 2015-03-07 DIAGNOSIS — F0390 Unspecified dementia without behavioral disturbance: Secondary | ICD-10-CM | POA: Diagnosis not present

## 2015-03-07 DIAGNOSIS — K589 Irritable bowel syndrome without diarrhea: Secondary | ICD-10-CM | POA: Diagnosis not present

## 2015-03-07 DIAGNOSIS — K5090 Crohn's disease, unspecified, without complications: Secondary | ICD-10-CM | POA: Diagnosis not present

## 2015-03-07 DIAGNOSIS — I10 Essential (primary) hypertension: Secondary | ICD-10-CM | POA: Diagnosis not present

## 2015-03-07 DIAGNOSIS — I2510 Atherosclerotic heart disease of native coronary artery without angina pectoris: Secondary | ICD-10-CM | POA: Diagnosis not present

## 2015-03-07 DIAGNOSIS — K5790 Diverticulosis of intestine, part unspecified, without perforation or abscess without bleeding: Secondary | ICD-10-CM | POA: Diagnosis not present

## 2015-03-07 NOTE — Telephone Encounter (Signed)
FYI pending appt tomorrow 2/29/17.

## 2015-03-07 NOTE — Telephone Encounter (Signed)
[place] rn is calling to let md know the pt does not want 8 hrs care. Butch Penny will let [facility]PS also. Pt has an appt tomorrow

## 2015-03-07 NOTE — Telephone Encounter (Signed)
Pt is very agitated irritable, mood swings, throwing things. Butch Penny w/ [facility] and [facility] were there together.  pt got excited and stormed out of the house stating they are going to cancel the nursing services. they told Butch Penny they are coming up to see Dr [name] today. Should be on the way. She is giving heads up.

## 2015-03-08 ENCOUNTER — Ambulatory Visit (INDEPENDENT_AMBULATORY_CARE_PROVIDER_SITE_OTHER): Payer: Medicare Other | Admitting: Family Medicine

## 2015-03-08 ENCOUNTER — Encounter: Payer: Self-pay | Admitting: Family Medicine

## 2015-03-08 VITALS — BP 170/90 | HR 88 | Temp 98.8°F | Wt 161.8 lb

## 2015-03-08 DIAGNOSIS — I10 Essential (primary) hypertension: Secondary | ICD-10-CM | POA: Diagnosis not present

## 2015-03-08 DIAGNOSIS — I483 Typical atrial flutter: Secondary | ICD-10-CM

## 2015-03-08 DIAGNOSIS — F0391 Unspecified dementia with behavioral disturbance: Secondary | ICD-10-CM | POA: Diagnosis not present

## 2015-03-08 DIAGNOSIS — F03918 Unspecified dementia, unspecified severity, with other behavioral disturbance: Secondary | ICD-10-CM

## 2015-03-08 MED ORDER — DONEPEZIL HCL 5 MG PO TABS: 5.0000 mg | ORAL_TABLET | Freq: Every day | ORAL | Status: DC

## 2015-03-08 MED ORDER — AMLODIPINE BESYLATE 5 MG PO TABS: 5.0000 mg | ORAL_TABLET | Freq: Every day | ORAL | Status: DC

## 2015-03-08 NOTE — Progress Notes (Signed)
Subjective:    Patient ID: [name], female    DOB: 04-13-1932, 82 y.o.   MRN: [id number]  HPI  Patient seen for medical follow-up  patient had recent syncopal episode. Echocardiogram unremarkable.  Currently has event monitor in place. She has history of atrial fibrillation by recent transmission through the monitor and is currently supposed to be on Xarelto.. We cannot confirm her medications because of her memory deficits.  We had gotten notification that yesterday she was very agitated and irritable with mood swings. Someone from [place] and [place] were out yesterday and patient reportedly got excited and stormed out of the house stating that they were going to cancel nursing services.  She and her husband have expressed frustration that someone is there 8 hours a day. They have definitely needed assistance with medications and driving but they have been reluctant to receive help.  Adult Protective Services is involved.    Patient denies any further syncopal episodes. No headaches. No chest pains.  Past Medical History  Diagnosis Date  . OSA (obstructive sleep apnea)   . History of bronchitis   . Other diseases of lung, not elsewhere classified   . Hypertension   . CAD (coronary artery disease)     LAD 30% 2006  . History of atrial flutter   . Cerebrovascular disease   . Hyperlipemia   . Obesity   . GERD (gastroesophageal reflux disease)   . Diverticulosis of colon   . IBS (irritable bowel syndrome)   . Colonic polyp   . Crohn's disease ([place])   . Hypothyroidism   . History of hyperparathyroidism   . Fibromyalgia   . Osteopenia   . History of headache   . Syncope   . Restless legs syndrome (RLS)   . Psychosis   . History of tuberculosis   . Radiation 05/2008    5000 cGy to left lower eyelid, parotid lymphatics and upper neck  . Memory disturbance   . Bipolar affective ([place])     History of psychosis  . 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 C.  difficile diarrhea    Past Surgical History  Procedure Laterality Date  . Parathyroid adenoma surgery    . Cholecystectomy    . Moh';s surg left lower eye lid surgery and lid reconstruction surgery  2/10  . Bladder suspension    . Cataract extraction Bilateral   . Appendectomy    . Tonsillectomy and adenoidectomy    . Lumbar laminectomy    . Incision and drainage perirectal abscess    . Heel spur excision    . Nasal sinus surgery Right     Right maxillary  . Eye lid surgery  04/04/2011    [facility] by Dr. Norlene Duel  . Posterior cervical fusion/foraminotomy N/A 10/12/2013    Procedure: Cervical five-Thoracic two cervico-thoracic posterior fusion;  Surgeon: [name], MD;  Location: [facility];  Service: Neurosurgery;  Laterality: N/A;    reports that she quit smoking about 55 years ago. Her smoking use included Cigarettes. She quit after 6 years of use. She has never used smokeless tobacco. She reports that she does not drink alcohol or use illicit drugs. family history includes Arthritis in her mother; Colon cancer in her maternal grandmother; Heart failure in her father; Hypertension in her mother; Ulcers in her mother. Allergies  Allergen Reactions  . Prednisone Other (See Comments)    Patient had psychiatric episode with  hallucinations. Caused 3.5 week long hospitalization & loss of memory   . Carbamazepine Nausea Only and Other (See Comments)    Tegretol; dizziness, blurred vision, nausea, headache, insomnia  . Celebrex [Celecoxib] Other (See Comments)    No relief from pain; ineffective  . Codeine Nausea And Vomiting  . Demerol [Meperidine] Nausea And Vomiting  . Diclofenac Sodium Other (See Comments)    severe headache  . Fentanyl Nausea And Vomiting and Other (See Comments)    IV Fentanyl; caused nausea and vomiting Fentanyl patch; caused chest pain, HBP, with second patch experienced vertigo and nausea  . Fluoxetine Hcl Nausea Only and Other (See Comments)    dizziness,  blurred vision, nausea, headache, insomnia  . Hydromorphone Hcl Nausea And Vomiting  . Ibuprofen Other (See Comments)    Caused ulcers and colitis  . Indomethacin Nausea Only and Other (See Comments)    dizziness, blurred vision, nausea, headache, insomnia  . Lisinopril Cough  . Meperidine Hcl Nausea And Vomiting  . Morphine Nausea And Vomiting  . Oruvail [Ketoprofen] Other (See Comments)    No relief from pain; ineffective  . Pregabalin Other (See Comments)    severe restless legs, insomnia  . Propranolol Hcl Nausea Only and Other (See Comments)    dizziness, blurred vision, nausea, headache, insomnia  . Refresh Lacri-Lube [Artificial Tears] Swelling    Caused swelling, redness, hard crust on eyelids   . Relafen [Nabumetone] Other (See Comments)    No relief from pain; ineffective  . Ropinirole Hcl Other (See Comments)    severe restless legs and insomnia  . Sulfamethoxazole Nausea Only and Other (See Comments)    gas, loose stools  . Bacitracin Hives, Itching and Rash    redness      Review of Systems  Constitutional: Negative for fatigue and unexpected weight change.  Eyes: Negative for visual disturbance.  Respiratory: Negative for cough, chest tightness, shortness of breath and wheezing.   Cardiovascular: Negative for chest pain, palpitations and leg swelling.  Gastrointestinal: Negative for abdominal pain.  Neurological: Negative for dizziness, seizures, syncope, weakness, light-headedness and headaches.  Hematological: Does not bruise/bleed easily.       Objective:   Physical Exam  Constitutional: She appears well-developed and well-nourished.  Neck: Neck supple. No thyromegaly present.  Cardiovascular: Normal rate.   Pulmonary/Chest: Effort normal and breath sounds normal. No respiratory distress. She has no wheezes. She has no rales.  Musculoskeletal: She exhibits no edema.          Assessment & Plan:   #1 dementia. She has a history of some recent  agitation. Would like to minimize medications as much as possible but may need to look at mood stabilizer at some point if she continues to have mood outburst. Try to avoid atypical antipsychotics because of association with increased overall mortality.  She expresses frustration with having a sitter 8 hours per day.   #2 atrial fibrillation/flutter. Confirm that she is taking Xarelto- I spoke with assistant who was with them today to check over her medications   #3 recent syncope. No recurrence. Event monitor in place. She has follow-up with cardiology in March  #4 hypertension.  Very poorly controlled today with systolic BP [id number]  Increase Amlodipine to 5 mg .  Reassess one month.

## 2015-03-08 NOTE — Progress Notes (Signed)
Pre visit review using our clinic review tool, if applicable. No additional management support is needed unless otherwise documented below in the visit note. 

## 2015-03-09 ENCOUNTER — Telehealth: Payer: Self-pay | Admitting: Family Medicine

## 2015-03-09 MED ORDER — AMLODIPINE BESYLATE 5 MG PO TABS: 5.0000 mg | ORAL_TABLET | Freq: Every day | ORAL | Status: DC

## 2015-03-09 NOTE — Telephone Encounter (Signed)
Brandy calling from [facility] [employer] need a prescription of patient Amlodipine with new with quantity and refills.. Please advise [phone]

## 2015-03-09 NOTE — Telephone Encounter (Signed)
Medication sent into the pharmacy.

## 2015-03-09 NOTE — Telephone Encounter (Signed)
Pt was seen yesterday  and pattie wilson from [facility] is calling concerning medication changes in bp med. Please call to clarify

## 2015-03-09 NOTE — Telephone Encounter (Signed)
Pre visit review using our clinic review tool, if applicable. No additional management support is needed unless otherwise documented below in the visit note. 

## 2015-03-09 NOTE — Telephone Encounter (Signed)
[name] is aware that medication has been sent into the pharmacy.

## 2015-03-09 NOTE — Telephone Encounter (Signed)
[name] is aware that medication dose was increased from 2.48m to 585m

## 2015-03-10 ENCOUNTER — Telehealth: Payer: Self-pay | Admitting: Family Medicine

## 2015-03-10 DIAGNOSIS — I2510 Atherosclerotic heart disease of native coronary artery without angina pectoris: Secondary | ICD-10-CM | POA: Diagnosis not present

## 2015-03-10 DIAGNOSIS — K5090 Crohn's disease, unspecified, without complications: Secondary | ICD-10-CM | POA: Diagnosis not present

## 2015-03-10 DIAGNOSIS — K5790 Diverticulosis of intestine, part unspecified, without perforation or abscess without bleeding: Secondary | ICD-10-CM | POA: Diagnosis not present

## 2015-03-10 DIAGNOSIS — I10 Essential (primary) hypertension: Secondary | ICD-10-CM | POA: Diagnosis not present

## 2015-03-10 DIAGNOSIS — K589 Irritable bowel syndrome without diarrhea: Secondary | ICD-10-CM | POA: Diagnosis not present

## 2015-03-10 DIAGNOSIS — F0390 Unspecified dementia without behavioral disturbance: Secondary | ICD-10-CM | POA: Diagnosis not present

## 2015-03-12 ENCOUNTER — Encounter (HOSPITAL_COMMUNITY): Payer: Self-pay | Admitting: Emergency Medicine

## 2015-03-12 ENCOUNTER — Emergency Department (HOSPITAL_COMMUNITY): Payer: Medicare Other

## 2015-03-12 ENCOUNTER — Emergency Department (HOSPITAL_COMMUNITY)
Admission: EM | Admit: 2015-03-12 | Discharge: 2015-03-12 | Disposition: A | Discharge status: Home / Self Care | Payer: Medicare Other | Attending: Emergency Medicine | Admitting: Emergency Medicine

## 2015-03-12 DIAGNOSIS — R1031 Right lower quadrant pain: Secondary | ICD-10-CM | POA: Insufficient documentation

## 2015-03-12 DIAGNOSIS — H0520 Unspecified exophthalmos: Secondary | ICD-10-CM | POA: Insufficient documentation

## 2015-03-12 DIAGNOSIS — M8580 Other specified disorders of bone density and structure, unspecified site: Secondary | ICD-10-CM | POA: Diagnosis not present

## 2015-03-12 DIAGNOSIS — N200 Calculus of kidney: Secondary | ICD-10-CM | POA: Diagnosis not present

## 2015-03-12 DIAGNOSIS — I2510 Atherosclerotic heart disease of native coronary artery without angina pectoris: Secondary | ICD-10-CM | POA: Diagnosis not present

## 2015-03-12 DIAGNOSIS — R112 Nausea with vomiting, unspecified: Secondary | ICD-10-CM | POA: Diagnosis not present

## 2015-03-12 DIAGNOSIS — Z79899 Other long term (current) drug therapy: Secondary | ICD-10-CM | POA: Diagnosis not present

## 2015-03-12 DIAGNOSIS — R109 Unspecified abdominal pain: Secondary | ICD-10-CM | POA: Diagnosis present

## 2015-03-12 DIAGNOSIS — E039 Hypothyroidism, unspecified: Secondary | ICD-10-CM | POA: Diagnosis not present

## 2015-03-12 DIAGNOSIS — Z7901 Long term (current) use of anticoagulants: Secondary | ICD-10-CM | POA: Insufficient documentation

## 2015-03-12 DIAGNOSIS — Z8619 Personal history of other infectious and parasitic diseases: Secondary | ICD-10-CM | POA: Diagnosis not present

## 2015-03-12 DIAGNOSIS — M797 Fibromyalgia: Secondary | ICD-10-CM | POA: Diagnosis not present

## 2015-03-12 DIAGNOSIS — Z7951 Long term (current) use of inhaled steroids: Secondary | ICD-10-CM | POA: Diagnosis not present

## 2015-03-12 DIAGNOSIS — Z8673 Personal history of transient ischemic attack (TIA), and cerebral infarction without residual deficits: Secondary | ICD-10-CM | POA: Insufficient documentation

## 2015-03-12 DIAGNOSIS — Z8709 Personal history of other diseases of the respiratory system: Secondary | ICD-10-CM | POA: Insufficient documentation

## 2015-03-12 DIAGNOSIS — Z8611 Personal history of tuberculosis: Secondary | ICD-10-CM | POA: Diagnosis not present

## 2015-03-12 DIAGNOSIS — K5090 Crohn's disease, unspecified, without complications: Secondary | ICD-10-CM | POA: Diagnosis not present

## 2015-03-12 DIAGNOSIS — Z86010 Personal history of colonic polyps: Secondary | ICD-10-CM | POA: Insufficient documentation

## 2015-03-12 DIAGNOSIS — R1033 Periumbilical pain: Secondary | ICD-10-CM | POA: Insufficient documentation

## 2015-03-12 DIAGNOSIS — F329 Major depressive disorder, single episode, unspecified: Secondary | ICD-10-CM | POA: Insufficient documentation

## 2015-03-12 DIAGNOSIS — Z9049 Acquired absence of other specified parts of digestive tract: Secondary | ICD-10-CM | POA: Diagnosis not present

## 2015-03-12 DIAGNOSIS — R1032 Left lower quadrant pain: Secondary | ICD-10-CM | POA: Insufficient documentation

## 2015-03-12 DIAGNOSIS — K219 Gastro-esophageal reflux disease without esophagitis: Secondary | ICD-10-CM | POA: Insufficient documentation

## 2015-03-12 DIAGNOSIS — E669 Obesity, unspecified: Secondary | ICD-10-CM | POA: Insufficient documentation

## 2015-03-12 DIAGNOSIS — K2970 Gastritis, unspecified, without bleeding: Secondary | ICD-10-CM | POA: Diagnosis not present

## 2015-03-12 DIAGNOSIS — G2581 Restless legs syndrome: Secondary | ICD-10-CM | POA: Diagnosis not present

## 2015-03-12 LAB — CBC WITH DIFFERENTIAL/PLATELET
Basophils Absolute: 0 10*3/uL (ref 0.0–0.1)
Basophils Relative: 0 %
EOS ABS: 0 10*3/uL (ref 0.0–0.7)
EOS PCT: 1 %
HCT: 42.8 % (ref 36.0–46.0)
Hemoglobin: 13.6 g/dL (ref 12.0–15.0)
LYMPHS ABS: 1.8 10*3/uL (ref 0.7–4.0)
LYMPHS PCT: 38 %
MCH: 31.2 pg (ref 26.0–34.0)
MCHC: 31.8 g/dL (ref 30.0–36.0)
MCV: 98.2 fL (ref 78.0–100.0)
Monocytes Absolute: 0.4 10*3/uL (ref 0.1–1.0)
Monocytes Relative: 9 %
Neutro Abs: 2.5 10*3/uL (ref 1.7–7.7)
Neutrophils Relative %: 52 %
PLATELETS: 191 10*3/uL (ref 150–400)
RBC: 4.36 MIL/uL (ref 3.87–5.11)
RDW: 13.5 % (ref 11.5–15.5)
WBC: 4.8 10*3/uL (ref 4.0–10.5)

## 2015-03-12 LAB — I-STAT TROPONIN, ED: TROPONIN I, POC: 0.01 ng/mL (ref 0.00–0.08)

## 2015-03-12 LAB — URINALYSIS, ROUTINE W REFLEX MICROSCOPIC
Bilirubin Urine: NEGATIVE
Glucose, UA: NEGATIVE mg/dL
Hgb urine dipstick: NEGATIVE
KETONES UR: NEGATIVE mg/dL
LEUKOCYTES UA: NEGATIVE
NITRITE: NEGATIVE
PROTEIN: NEGATIVE mg/dL
Specific Gravity, Urine: 1.03 (ref 1.005–1.030)
pH: 5.5 (ref 5.0–8.0)

## 2015-03-12 LAB — COMPREHENSIVE METABOLIC PANEL
ALT: 20 U/L (ref 14–54)
ANION GAP: 9 (ref 5–15)
AST: 26 U/L (ref 15–41)
Albumin: 4.3 g/dL (ref 3.5–5.0)
Alkaline Phosphatase: 67 U/L (ref 38–126)
BILIRUBIN TOTAL: 0.7 mg/dL (ref 0.3–1.2)
BUN: 18 mg/dL (ref 6–20)
CO2: 27 mmol/L (ref 22–32)
Calcium: 9.5 mg/dL (ref 8.9–10.3)
Chloride: 105 mmol/L (ref 101–111)
Creatinine, Ser: 0.8 mg/dL (ref 0.44–1.00)
GFR calc Af Amer: >60 mL/min (ref >60)
Glucose, Bld: 113 mg/dL — ABNORMAL HIGH (ref 65–99)
POTASSIUM: 3.9 mmol/L (ref 3.5–5.1)
SODIUM: 141 mmol/L (ref 135–145)
TOTAL PROTEIN: 7.2 g/dL (ref 6.5–8.1)

## 2015-03-12 LAB — LIPASE, BLOOD: LIPASE: 40 U/L (ref 11–51)

## 2015-03-12 LAB — I-STAT CG4 LACTIC ACID, ED: Lactic Acid, Venous: 1.77 mmol/L (ref 0.5–2.0)

## 2015-03-12 MED ORDER — HYDROMORPHONE HCL 1 MG/ML IJ SOLN
0.5000 mg | Freq: Once | INTRAMUSCULAR | Status: DC
  Filled 2015-03-12: qty 1

## 2015-03-12 MED ORDER — SODIUM CHLORIDE 0.9 % IV SOLN
1000.0000 mL | Freq: Once | INTRAVENOUS | Status: AC
  Administered 2015-03-12: 1000 mL via INTRAVENOUS

## 2015-03-12 MED ORDER — ONDANSETRON HCL 4 MG/2ML IJ SOLN
4.0000 mg | Freq: Once | INTRAMUSCULAR | Status: AC
  Administered 2015-03-12: 4 mg via INTRAVENOUS
  Filled 2015-03-12: qty 2

## 2015-03-12 MED ORDER — SODIUM CHLORIDE 0.9 % IV BOLUS (SEPSIS)
500.0000 mL | Freq: Once | INTRAVENOUS | Status: AC
  Administered 2015-03-12: 500 mL via INTRAVENOUS

## 2015-03-12 MED ORDER — IOHEXOL 300 MG/ML  SOLN
50.0000 mL | Freq: Once | INTRAMUSCULAR | Status: AC | PRN
  Administered 2015-03-12: 50 mL via ORAL

## 2015-03-12 MED ORDER — DICYCLOMINE HCL 10 MG PO CAPS: 10.0000 mg | ORAL_CAPSULE | Freq: Three times a day (TID) | ORAL | Status: AC

## 2015-03-12 NOTE — Discharge Instructions (Signed)
Abdominal (belly) pain can be caused by many things. Your caregiver performed an examination and possibly ordered blood/urine tests and imaging (CT scan, x-rays, ultrasound). Many cases can be observed and treated at home after initial evaluation in the emergency department. Even though you are being discharged home, abdominal pain can be unpredictable. Therefore, you need a repeated exam if your pain does not resolve, returns, or worsens. Most patients with abdominal pain don't have to be admitted to the hospital or have surgery, but serious problems like appendicitis and gallbladder attacks can start out as nonspecific pain. Many abdominal conditions cannot be diagnosed in one visit, so follow-up evaluations are very important. SEEK IMMEDIATE MEDICAL ATTENTION IF: The pain does not go away or becomes severe.  A temperature above 101 develops.  Repeated vomiting occurs (multiple episodes).  The pain becomes localized to portions of the abdomen. The right side could possibly be appendicitis. In an adult, the left lower portion of the abdomen could be colitis or diverticulitis.  Blood is being passed in stools or vomit (bright red or black tarry stools).  Return also if you develop chest pain, difficulty breathing, dizziness or fainting, or become confused, poorly responsive, or inconsolable (young children).  Abdominal Pain, Adult Many things can cause abdominal pain. Usually, abdominal pain is not caused by a disease and will improve without treatment. It can often be observed and treated at home. Your health care provider will do a physical exam and possibly order blood tests and X-rays to help determine the seriousness of your pain. However, in many cases, more time must pass before a clear cause of the pain can be found. Before that point, your health care provider may not know if you need more testing or further treatment. HOME CARE INSTRUCTIONS Monitor your abdominal pain for any changes. The  following actions may help to alleviate any discomfort you are experiencing:  Only take over-the-counter or prescription medicines as directed by your health care provider.  Do not take laxatives unless directed to do so by your health care provider.  Try a clear liquid diet (broth, tea, or water) as directed by your health care provider. Slowly move to a bland diet as tolerated. SEEK MEDICAL CARE IF:  You have unexplained abdominal pain.  You have abdominal pain associated with nausea or diarrhea.  You have pain when you urinate or have a bowel movement.  You experience abdominal pain that wakes you in the night.  You have abdominal pain that is worsened or improved by eating food.  You have abdominal pain that is worsened with eating fatty foods.  You have a fever. SEEK IMMEDIATE MEDICAL CARE IF:  Your pain does not go away within 2 hours.  You keep throwing up (vomiting).  Your pain is felt only in portions of the abdomen, such as the right side or the left lower portion of the abdomen.  You pass bloody or black tarry stools. MAKE SURE YOU:  Understand these instructions.  Will watch your condition.  Will get help right away if you are not doing well or get worse.   This information is not intended to replace advice given to you by your health care provider. Make sure you discuss any questions you have with your health care provider.   Document Released: 10/03/2004 Document Revised: 09/14/2014 Document Reviewed: 09/02/2012 Elsevier Interactive Patient Education 2016 [place] tablets or capsules What is this medicine? DICYCLOMINE (dye SYE kloe meen) is used to treat bowel problems including  irritable bowel syndrome. This medicine may be used for other purposes; ask your health care provider or pharmacist if you have questions. What should I tell my health care provider before I take this medicine? They need to know if you have any of these  conditions: -difficulty passing urine -esophagus problems or heartburn -glaucoma -heart disease, or previous heart attack -myasthenia gravis -prostate trouble -stomach infection, or obstruction -ulcerative colitis -an unusual or allergic reaction to dicyclomine, other medicines, foods, dyes, or preservatives -pregnant or trying to get pregnant -breast-feeding How should I use this medicine? Take this medicine by mouth with a glass of water. Follow the directions on the prescription label. It is best to take this medicine on an empty stomach, 30 minutes to 1 hour before meals. Take your medicine at regular intervals. Do not take your medicine more often than directed. Talk to your pediatrician regarding the use of this medicine in children. Special care may be needed. While this drug may be prescribed for children as young as 82 months of age for selected conditions, precautions do apply. Patients over 1 years old may have a stronger reaction and need a smaller dose. Overdosage: If you think you have taken too much of this medicine contact a poison control center or emergency room at once. NOTE: This medicine is only for you. Do not share this medicine with others. What if I miss a dose? If you miss a dose, take it as soon as you can. If it is almost time for your next dose, take only that dose. Do not take double or extra doses. What may interact with this medicine? -amantadine -antacids -benztropine -digoxin -disopyramide -medicines for allergies, colds and breathing difficulties -medicines for alzheimer's disease -medicines for anxiety or sleeping problems -medicines for depression or psychotic disturbances -medicines for diarrhea -medicines for pain -metoclopramide -tegaserod This list may not describe all possible interactions. Give your health care provider a list of all the medicines, herbs, non-prescription drugs, or dietary supplements you use. Also tell them if you smoke,  drink alcohol, or use illegal drugs. Some items may interact with your medicine. What should I watch for while using this medicine? You may get drowsy, dizzy, or have blurred vision. Do not drive, use machinery, or do anything that needs mental alertness until you know how this medicine affects you. To reduce the risk of dizzy or fainting spells, do not sit or stand up quickly, especially if you are an older patient. Alcohol can make you more drowsy, avoid alcoholic drinks. Stay out of bright light and wear sunglasses if this medicine makes your eyes more sensitive to light. Avoid extreme heat (hot tubs, saunas). This medicine can cause you to sweat less than normal. Your body temperature could increase to dangerous levels, which may lead to heat stroke. Antacids can stop this medicine from working. If you get an upset stomach and want to take an antacid, make sure there is an interval of at least 1 to 2 hours before or after you take this medicine. Your mouth may get dry. Chewing sugarless gum or sucking hard candy, and drinking plenty of water may help. Contact your doctor if the problem does not go away or is severe. What side effects may I notice from receiving this medicine? Side effects that you should report to your doctor or health care professional as soon as possible: -agitation, nervousness, confusion -difficulty swallowing -dizziness, drowsiness -fast or slow heartbeat -hallucinations -pain or difficulty passing urine Side effects that usually  do not require medical attention (report to your doctor or health care professional if they continue or are bothersome): -constipation -headache -nausea or vomiting -sexual difficulty This list may not describe all possible side effects. Call your doctor for medical advice about side effects. You may report side effects to FDA at 1-800-FDA-1088. Where should I keep my medicine? Keep out of the reach of children. Store at room temperature  below 30 degrees C (86 degrees F). Protect from light. Throw away any unused medicine after the expiration date. NOTE: This sheet is a summary. It may not cover all possible information. If you have questions about this medicine, talk to your doctor, pharmacist, or health care provider.    2016, Elsevier/Gold Standard. (2007-04-14 17:12:34)

## 2015-03-12 NOTE — ED Notes (Signed)
Pt woke with nausea, vomiting x 1. Pt with hx of diverticulitis. Pt denies diarrhea.

## 2015-03-12 NOTE — ED Notes (Signed)
Bed: [id number] Expected date: 03/12/15 Expected time: 8:32 AM Means of arrival: Ambulance Comments: N/V

## 2015-03-12 NOTE — ED Provider Notes (Signed)
Patient presents to the emergency room with complaints of nausea vomiting and abdominal pain. The pain is in her lower abdomen bilaterally and feels similar to prior bouts with diverticulitis. Physical Exam  BP 199/73 mmHg  Pulse 100  Temp(Src) 98.4 F (36.9 C) (Oral)  Resp 20  SpO2 100%  Physical Exam  Constitutional: She appears well-nourished. No distress.  Elderly  HENT:  Head: Normocephalic and atraumatic.  Right Ear: External ear normal.  Left Ear: External ear normal.  Eyes: Conjunctivae are normal. Right eye exhibits no discharge. Left eye exhibits no discharge. No scleral icterus.  Neck: Neck supple. No tracheal deviation present.  Cardiovascular: Normal rate.   Pulmonary/Chest: Effort normal. No stridor. No respiratory distress.  Abdominal: Soft. She exhibits no distension and no mass. There is tenderness in the right lower quadrant and left lower quadrant. There is no guarding.  Musculoskeletal: She exhibits no edema.  Neurological: She is alert. Cranial nerve deficit: no gross deficits.  Skin: Skin is warm and dry. No rash noted.  Psychiatric: She has a normal mood and affect.  Nursing note and vitals reviewed.   ED Course  Procedures  MDM No peritoneal signs on exam.  TTP in the lower abdomen bilaterally without guarding.  Labs pending.  CT scan does not demonstrate acute diverticulitis or other pathology.   Will wait on labs and treat accordingly      Dorie Rank, MD 03/12/15 1001

## 2015-03-12 NOTE — ED Provider Notes (Signed)
CSN: [id number]     Arrival date & time 03/12/15  [id number] History   First MD Initiated Contact with Patient 03/12/15 [phone]     Chief Complaint  Patient presents with  . Abdominal Pain  . Emesis     (Consider location/radiation/quality/duration/timing/severity/associated sxs/prior Treatment) HPI  [name] Is an 82 year old female presents to the emergency department with chief complaint of abdominal pain. Past medical history as listed below. Patient states she also has a history of diverticulitis and this feels the same. She states that yesterday she began to feel unwell. This morning she awoke with severe abdominal pain which is localized to the umbilical region. She's had multiple episodes of nausea and vomiting. She denies urinary symptoms, fevers, chills, myalgias or other signs of systemic infection. She has no previous abdominal surgeries. She denies chest pain or shortness of breath. She denies diarrhea, melena or hematochezia. Past Medical History  Diagnosis Date  . OSA (obstructive sleep apnea)   . History of bronchitis   . Other diseases of lung, not elsewhere classified   . Hypertension   . CAD (coronary artery disease)     LAD 30% 2006  . History of atrial flutter   . Cerebrovascular disease   . Hyperlipemia   . Obesity   . GERD (gastroesophageal reflux disease)   . Diverticulosis of colon   . IBS (irritable bowel syndrome)   . Colonic polyp   . Crohn's disease ([place])   . Hypothyroidism   . History of hyperparathyroidism   . Fibromyalgia   . Osteopenia   . History of headache   . Syncope   . Restless legs syndrome (RLS)   . Psychosis   . History of tuberculosis   . Radiation 05/2008    5000 cGy to left lower eyelid, parotid lymphatics and upper neck  . Memory disturbance   . Bipolar affective ([place])     History of psychosis  . 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 C. difficile diarrhea    Past Surgical History  Procedure Laterality Date  . Parathyroid  adenoma surgery    . Cholecystectomy    . Moh';s surg left lower eye lid surgery and lid reconstruction surgery  2/10  . Bladder suspension    . Cataract extraction Bilateral   . Appendectomy    . Tonsillectomy and adenoidectomy    . Lumbar laminectomy    . Incision and drainage perirectal abscess    . Heel spur excision    . Nasal sinus surgery Right     Right maxillary  . Eye lid surgery  04/04/2011    [facility] by Dr. Norlene Duel  . Posterior cervical fusion/foraminotomy N/A 10/12/2013    Procedure: Cervical five-Thoracic two cervico-thoracic posterior fusion;  Surgeon: [name], MD;  Location: [facility];  Service: Neurosurgery;  Laterality: N/A;   Family History  Problem Relation Age of Onset  . Colon cancer Maternal Grandmother   . Arthritis Mother   . Hypertension Mother   . Ulcers Mother   . Heart failure Father   . Pulmonary embolism     Social History  Substance Use Topics  . Smoking status: Former Smoker -- 6 years    Types: Cigarettes    Quit date: 01/08/1960  . Smokeless tobacco: Never Used  . Alcohol Use: No   OB History    No data available     Review of Systems Ten systems reviewed and are negative for acute  change, except as noted in the HPI.     Allergies  Prednisone; Carbamazepine; Celebrex; Codeine; Demerol; Diclofenac sodium; Fentanyl; Fluoxetine hcl; Hydromorphone hcl; Ibuprofen; Indomethacin; Lisinopril; Meperidine hcl; Morphine; Oruvail; Pregabalin; Propranolol hcl; Refresh lacri-lube; Relafen; Ropinirole hcl; Sulfamethoxazole; and Bacitracin  Home Medications   Prior to Admission medications   Medication Sig Start Date End Date Taking? Authorizing Provider  amLODipine (NORVASC) 5 MG tablet Take 1 tablet (5 mg total) by mouth daily. 03/09/15   [name], MD  atorvastatin (LIPITOR) 40 MG tablet Take 1 tablet (40 mg total) by mouth daily. 02/28/15   [name], MD  Calcium-Magnesium 500-250 MG TABS Take 2 tablets by mouth daily at 6  PM. 02/28/15   [name], MD  Cholecalciferol (VITAMIN D3) 2000 units TABS Take 2,000 Units by mouth daily at 12 noon. 02/28/15   [name], MD  donepezil (ARICEPT) 5 MG tablet Take 1 tablet (5 mg total) by mouth at bedtime. 03/08/15   [name], MD  fluticasone (FLONASE) 50 MCG/ACT nasal spray Place 2 sprays into both nostrils at bedtime. 02/28/15   [name], MD  levothyroxine (SYNTHROID, LEVOTHROID) 88 MCG tablet Take 1 tablet (88 mcg total) by mouth daily before breakfast. 02/28/15   [name], MD  Melatonin 10 MG TABS Take 10 mg by mouth 1 day or 1 dose. 02/28/15   [name], MD  mesalamine (LIALDA) 1.2 g EC tablet Take 3 tablets (3.6 g total) by mouth daily with lunch. 02/28/15   [name], MD  pramipexole (MIRAPEX) 0.5 MG tablet Take 1 tablet (0.5 mg total) by mouth 2 (two) times daily. 02/28/15   [name], MD  rivaroxaban (XARELTO) 20 MG TABS tablet Take 1 tablet (20 mg total) by mouth daily with supper. 02/28/15   [name], MD  traMADol (ULTRAM) 50 MG tablet Take 50 mg by mouth every 6 (six) hours as needed for moderate pain or severe pain.    Historical Provider, MD   BP 199/73 mmHg  Pulse 100  Temp(Src) 98.4 F (36.9 C) (Oral)  Resp 20  SpO2 100% Physical Exam  Constitutional: She is oriented to person, place, and time.  Patient is tearful. She is breathing in short, syncopated breaths. She is able to speak in full breaths.  HENT:  Head: Normocephalic and atraumatic.  Eyes: EOM are normal. Pupils are equal, round, and reactive to light.  Left Eye with scarred webbing to the medial aspect. Mild proptosis.  Neck: Normal range of motion. Neck supple. No JVD present.  Pulmonary/Chest: Breath sounds normal. She has no wheezes.  Shallow effort  Abdominal: Soft. Bowel sounds are normal. She exhibits no distension and no mass. There is tenderness (At the umbilicus). There is no guarding.  Tenderness at the [place].   Umbilical hernia  Neurological: She is alert and oriented to person, place, and time.  Skin: Skin is warm and dry.  Nursing note and vitals reviewed.   ED Course  Procedures (including critical care time) Labs Review Labs Reviewed - No data to display  Imaging Review No results found. I have personally reviewed and evaluated these images and lab results as part of my medical decision-making.   EKG Interpretation None      MDM   Final diagnoses:  Periumbilical abdominal pain    9:27 AM BP 199/73 mmHg  Pulse 100  Temp(Src) 98.4 F (36.9 C) (Oral)  Resp 20  SpO2 100% Patient with  hx of IBS, on Mesalamine. I question if this is actually inflammatory bowel disease. The patient appears extremely uncomfortable. She is on a blood center, but when asked, the patient states "I don't think so." I have ordered a CT of the abdomen. Pain control for the patient.  12:20 PM BP 151/48 mmHg  Pulse 82  Temp(Src) 98.6 F (37 C) (Oral)  Resp 27  SpO2 100% Patient seen in shared visit with Dr. Hillard Danker Patient is nontoxic, nonseptic appearing, in no apparent distress.  Patient's pain and other symptoms adequately managed in emergency department.  Fluid bolus given.  Labs, imaging and vitals reviewed.  Patient does not meet the SIRS or Sepsis criteria.  On repeat exam patient does not have a surgical abdomin and there are no peritoneal signs.  No indication of appendicitis, bowel obstruction, bowel perforation, cholecystitis, diverticulitis.  Patient discharged home with symptomatic treatment and given strict instructions for follow-up with their primary care physician. She is tolerating fluids I have also discussed reasons to return immediately to the ER.The patient states that she would like to be discharged as she has no pain and feels much better.  Patient expresses understanding and agrees with plan.     Margarita Mail, PA-C 03/12/15 1224

## 2015-03-12 NOTE — ED Notes (Signed)
Pt tolerating po fluids

## 2015-03-13 ENCOUNTER — Telehealth: Payer: Self-pay | Admitting: Family Medicine

## 2015-03-13 NOTE — Telephone Encounter (Signed)
Follow up .  VERY difficult to assess with her dementia.   Fiber supplement such as Fibercon or Metamucil might bulk up her stools.

## 2015-03-13 NOTE — Telephone Encounter (Signed)
Husband call to ask if something can be called in for loose stool and headaches      Pharmacy [facility]

## 2015-03-13 NOTE — Telephone Encounter (Signed)
Spoke with pt and husband and they said this is something that has been on going

## 2015-03-13 NOTE — Telephone Encounter (Signed)
Pt needs an appt if this is something that is recent to be evaluated. Cannot treat over the phone.

## 2015-03-13 NOTE — Telephone Encounter (Signed)
Please review pts recent hospital stay. Is there anything else we can do for her over the phone for okay to schedule follow up to discuss?

## 2015-03-14 NOTE — Telephone Encounter (Signed)
Tried calling pt with no answer.

## 2015-03-14 NOTE — Telephone Encounter (Signed)
Husband came to the office today. Message was given to him.

## 2015-03-14 NOTE — Telephone Encounter (Signed)
error 

## 2015-03-15 DIAGNOSIS — K5090 Crohn's disease, unspecified, without complications: Secondary | ICD-10-CM | POA: Diagnosis not present

## 2015-03-15 DIAGNOSIS — K5790 Diverticulosis of intestine, part unspecified, without perforation or abscess without bleeding: Secondary | ICD-10-CM | POA: Diagnosis not present

## 2015-03-15 DIAGNOSIS — I2510 Atherosclerotic heart disease of native coronary artery without angina pectoris: Secondary | ICD-10-CM | POA: Diagnosis not present

## 2015-03-15 DIAGNOSIS — K589 Irritable bowel syndrome without diarrhea: Secondary | ICD-10-CM | POA: Diagnosis not present

## 2015-03-15 DIAGNOSIS — I10 Essential (primary) hypertension: Secondary | ICD-10-CM | POA: Diagnosis not present

## 2015-03-15 DIAGNOSIS — F0390 Unspecified dementia without behavioral disturbance: Secondary | ICD-10-CM | POA: Diagnosis not present

## 2015-03-17 ENCOUNTER — Ambulatory Visit (INDEPENDENT_AMBULATORY_CARE_PROVIDER_SITE_OTHER): Payer: Medicare Other | Admitting: Family Medicine

## 2015-03-17 VITALS — BP 140/70 | HR 110 | Temp 98.6°F | Ht 61.0 in | Wt 162.0 lb

## 2015-03-17 DIAGNOSIS — F0391 Unspecified dementia with behavioral disturbance: Secondary | ICD-10-CM

## 2015-03-17 DIAGNOSIS — E038 Other specified hypothyroidism: Secondary | ICD-10-CM | POA: Diagnosis not present

## 2015-03-17 DIAGNOSIS — I10 Essential (primary) hypertension: Secondary | ICD-10-CM

## 2015-03-17 DIAGNOSIS — F03918 Unspecified dementia, unspecified severity, with other behavioral disturbance: Secondary | ICD-10-CM

## 2015-03-17 LAB — TSH: TSH: 1.03 u[IU]/mL (ref 0.35–4.50)

## 2015-03-17 NOTE — Progress Notes (Signed)
Subjective:    Patient ID: [name], female    DOB: February 13, 1932, 82 y.o.   MRN: [id number]  HPI  Patient has Alzheimer's dementia with intermittent behavioral disturbance.  She is seen today accompanied by her husband. Patient denies any agitation and so does her husband but we had gotten a call recently from registered nurse supervisor with [facility] that she was agitated on several recent visits- and occasionally "throwing things ". Apparently she and her husband are agitated that people come into their house to help.   We had contacted Adult Protective Services several weeks ago because of concerns with inability for them to take their medications appropriately and also other safety concerns-such as driving. They have been getting considerable assistance.   Patient has multiple medical problems including hypertension, restless leg syndrome, history of regional enteritis, history of recurrent syncope which is currently being evaluated by event monitor, obesity, obstructive sleep apnea, hypothyroidism, hyperlipidemia. There is report that she was admitted back in 2009 with history of psychosis. We are not aware of any prior diagnosis of bipolar disorder.   She had TSH few months ago 6.2 but not clear she was taking her levothyroxine consistently at that time. Needs follow-up now.   Recent blood pressure poorly controlled at 170/90. We increased her amlodipine to 5 mg daily.    She's had some recent diarrhea but denies any today. She had episode of C. Difficile few months ago which was challenging to clear largely because of her inconsistencies with taking prescribed medication.  Past Medical History  Diagnosis Date  . OSA (obstructive sleep apnea)   . History of bronchitis   . Other diseases of lung, not elsewhere classified   . Hypertension   . CAD (coronary artery disease)     LAD 30% 2006  . History of atrial flutter   . Cerebrovascular disease   . Hyperlipemia   .  Obesity   . GERD (gastroesophageal reflux disease)   . Diverticulosis of colon   . IBS (irritable bowel syndrome)   . Colonic polyp   . Crohn's disease ([place])   . Hypothyroidism   . History of hyperparathyroidism   . Fibromyalgia   . Osteopenia   . History of headache   . Syncope   . Restless legs syndrome (RLS)   . Psychosis   . History of tuberculosis   . Radiation 05/2008    5000 cGy to left lower eyelid, parotid lymphatics and upper neck  . Memory disturbance   . Bipolar affective ([place])     History of psychosis  . 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 C. difficile diarrhea    Past Surgical History  Procedure Laterality Date  . Parathyroid adenoma surgery    . Cholecystectomy    . Moh';s surg left lower eye lid surgery and lid reconstruction surgery  2/10  . Bladder suspension    . Cataract extraction Bilateral   . Appendectomy    . Tonsillectomy and adenoidectomy    . Lumbar laminectomy    . Incision and drainage perirectal abscess    . Heel spur excision    . Nasal sinus surgery Right     Right maxillary  . Eye lid surgery  04/04/2011    [facility] by Dr. Norlene Duel  . Posterior cervical fusion/foraminotomy N/A 10/12/2013    Procedure: Cervical five-Thoracic two cervico-thoracic posterior fusion;  Surgeon: [name], MD;  Location: [place] [facility];  Service: Neurosurgery;  Laterality: N/A;    reports that she quit smoking about 55 years ago. Her smoking use included Cigarettes. She quit after 6 years of use. She has never used smokeless tobacco. She reports that she does not drink alcohol or use illicit drugs. family history includes Arthritis in her mother; Colon cancer in her maternal grandmother; Heart failure in her father; Hypertension in her mother; Ulcers in her mother. Allergies  Allergen Reactions  . Prednisone Other (See Comments)    Patient had psychiatric episode with hallucinations. Caused 3.5 week long hospitalization & loss of memory   . Carbamazepine  Nausea Only and Other (See Comments)    Tegretol; dizziness, blurred vision, nausea, headache, insomnia  . Celebrex [Celecoxib] Other (See Comments)    No relief from pain; ineffective  . Codeine Nausea And Vomiting  . Demerol [Meperidine] Nausea And Vomiting  . Diclofenac Sodium Other (See Comments)    severe headache  . Fentanyl Nausea And Vomiting and Other (See Comments)    IV Fentanyl; caused nausea and vomiting Fentanyl patch; caused chest pain, HBP, with second patch experienced vertigo and nausea  . Fluoxetine Hcl Nausea Only and Other (See Comments)    dizziness, blurred vision, nausea, headache, insomnia  . Hydromorphone Hcl Nausea And Vomiting  . Ibuprofen Other (See Comments)    Caused ulcers and colitis  . Indomethacin Nausea Only and Other (See Comments)    dizziness, blurred vision, nausea, headache, insomnia  . Lisinopril Cough  . Meperidine Hcl Nausea And Vomiting  . Morphine Nausea And Vomiting  . Oruvail [Ketoprofen] Other (See Comments)    No relief from pain; ineffective  . Pregabalin Other (See Comments)    severe restless legs, insomnia  . Propranolol Hcl Nausea Only and Other (See Comments)    dizziness, blurred vision, nausea, headache, insomnia  . Refresh Lacri-Lube [Artificial Tears] Swelling    Caused swelling, redness, hard crust on eyelids   . Relafen [Nabumetone] Other (See Comments)    No relief from pain; ineffective  . Ropinirole Hcl Other (See Comments)    severe restless legs and insomnia  . Sulfamethoxazole Nausea Only and Other (See Comments)    gas, loose stools  . Bacitracin Hives, Itching and Rash    redness     Review of Systems  Unreliable history from patient secondary to dementia    Objective:   Physical Exam  Constitutional: She appears well-developed and well-nourished.  HENT:  Mouth/Throat: Oropharynx is clear and moist.  Cardiovascular: Normal rate and regular rhythm.   Pulmonary/Chest: Effort normal and breath sounds  normal. No respiratory distress. She has no wheezes. She has no rales.  Abdominal: Soft. There is no tenderness.  Musculoskeletal: She exhibits no edema.  Neurological: She is alert.          Assessment & Plan:   #1 hypertension. Improved by today's reading after recent increase in amlodipine.   #2 hypothyroidism. Recent elevated TSH. Recheck TSH and adjust medication accordingly   #3 Alzheimer's dementia with reported behavioral disturbance. History today unreliable from patient and husband who both deny any recent behavioral issues. We have received several reports from those trying to care for them but they become very agitated. Challenging situation. Would try to avoid benzodiazepines secondary to risk of falls. Could consider medications such as citalopram but this could trigger worsening behavior if she has undiagnosed bipolar. Could consider medications (mood stabilizer) such as low-dose Depakote but before deciding on this would like to have further  discussions with those were been involved with her care to see about their observations regarding her behavior.

## 2015-03-17 NOTE — Progress Notes (Signed)
Pre visit review using our clinic review tool, if applicable. No additional management support is needed unless otherwise documented below in the visit note. 

## 2015-03-21 DIAGNOSIS — K589 Irritable bowel syndrome without diarrhea: Secondary | ICD-10-CM | POA: Diagnosis not present

## 2015-03-21 DIAGNOSIS — I10 Essential (primary) hypertension: Secondary | ICD-10-CM | POA: Diagnosis not present

## 2015-03-21 DIAGNOSIS — K5790 Diverticulosis of intestine, part unspecified, without perforation or abscess without bleeding: Secondary | ICD-10-CM | POA: Diagnosis not present

## 2015-03-21 DIAGNOSIS — F0390 Unspecified dementia without behavioral disturbance: Secondary | ICD-10-CM | POA: Diagnosis not present

## 2015-03-21 DIAGNOSIS — I2510 Atherosclerotic heart disease of native coronary artery without angina pectoris: Secondary | ICD-10-CM | POA: Diagnosis not present

## 2015-03-21 DIAGNOSIS — K5090 Crohn's disease, unspecified, without complications: Secondary | ICD-10-CM | POA: Diagnosis not present

## 2015-03-24 ENCOUNTER — Ambulatory Visit: Payer: Medicare Other | Admitting: Family Medicine

## 2015-03-28 DIAGNOSIS — K5790 Diverticulosis of intestine, part unspecified, without perforation or abscess without bleeding: Secondary | ICD-10-CM | POA: Diagnosis not present

## 2015-03-28 DIAGNOSIS — K589 Irritable bowel syndrome without diarrhea: Secondary | ICD-10-CM | POA: Diagnosis not present

## 2015-03-28 DIAGNOSIS — K5090 Crohn's disease, unspecified, without complications: Secondary | ICD-10-CM | POA: Diagnosis not present

## 2015-03-28 DIAGNOSIS — I2510 Atherosclerotic heart disease of native coronary artery without angina pectoris: Secondary | ICD-10-CM | POA: Diagnosis not present

## 2015-03-28 DIAGNOSIS — F0390 Unspecified dementia without behavioral disturbance: Secondary | ICD-10-CM | POA: Diagnosis not present

## 2015-03-28 DIAGNOSIS — I10 Essential (primary) hypertension: Secondary | ICD-10-CM | POA: Diagnosis not present

## 2015-04-02 NOTE — Progress Notes (Signed)
Cardiology Office Note   Date:  04/04/2015   ID:  [name], [name] 06-18-1932, MRN [id number]  PCP:  [name], MD  Cardiologist:   Minus Breeding, MD   Chief Complaint  Patient presents with  . Atrial Fibrillation      History of Present Illness: [name] is a 82 y.o. female who presents for evaluation of atrial fibrillation. I saw her previously. She had syncope and this was vasovagal. She had atrial flutter status post ablation in 2005.  I saw her last several years ago. She was seen in our practice recently for follow-up of atrial fibrillation.  She wore an event monitor and I have reviewed this. She has paroxysmal atrial fibrillation area she doesn't have this every day. There were a few days when she had that lasted for 10 hours. Other times it was more short lived 3 or 4 hours. She does have a somewhat rapid rate when this happens. At other times she has sinus rhythm or sinus bradycardia. She is completely asymptomatic. She denies any chest pressure, neck or arm discomfort. She walks around with a walker and she is doing well with this. She was started on anticoagulation with Xarelto and has done well with this. She denies any bruising or bleeding.  Past Medical History  Diagnosis Date  . OSA (obstructive sleep apnea)   . History of bronchitis   . Other diseases of lung, not elsewhere classified   . Hypertension   . CAD (coronary artery disease)     LAD 30% 2006  . History of atrial flutter   . Cerebrovascular disease   . Hyperlipemia   . Obesity   . GERD (gastroesophageal reflux disease)   . Diverticulosis of colon   . IBS (irritable bowel syndrome)   . Colonic polyp   . Crohn's disease ([place])   . Hypothyroidism   . History of hyperparathyroidism   . Fibromyalgia   . Osteopenia   . History of headache   . Syncope   . Restless legs syndrome (RLS)   . Psychosis   . History of tuberculosis   . Radiation 05/2008    5000 cGy to left lower eyelid, parotid  lymphatics and upper neck  . Memory disturbance   . Bipolar affective ([place])     History of psychosis  . 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 C. difficile diarrhea     Past Surgical History  Procedure Laterality Date  . Parathyroid adenoma surgery    . Cholecystectomy    . Moh';s surg left lower eye lid surgery and lid reconstruction surgery  2/10  . Bladder suspension    . Cataract extraction Bilateral   . Appendectomy    . Tonsillectomy and adenoidectomy    . Lumbar laminectomy    . Incision and drainage perirectal abscess    . Heel spur excision    . Nasal sinus surgery Right     Right maxillary  . Eye lid surgery  04/04/2011    [facility] by Dr. Norlene Duel  . Posterior cervical fusion/foraminotomy N/A 10/12/2013    Procedure: Cervical five-Thoracic two cervico-thoracic posterior fusion;  Surgeon: [name], MD;  Location: [place] [facility];  Service: Neurosurgery;  Laterality: N/A;     Current Outpatient Prescriptions  Medication Sig Dispense Refill  . amLODipine (NORVASC) 5 MG tablet Take 1 tablet (5 mg total) by mouth daily. 90 tablet 3  . atorvastatin (LIPITOR) 40 MG tablet  Take 1 tablet (40 mg total) by mouth daily. 30 tablet 11  . Calcium-Magnesium 500-250 MG TABS Take 2 tablets by mouth daily at 6 PM. 60 each 11  . Cholecalciferol (VITAMIN D3) 2000 units TABS Take 2,000 Units by mouth daily at 12 noon. 30 tablet 11  . dicyclomine (BENTYL) 10 MG capsule Take 1 capsule (10 mg total) by mouth 3 (three) times daily before meals. 20 capsule 0  . donepezil (ARICEPT) 5 MG tablet Take 1 tablet (5 mg total) by mouth at bedtime. 30 tablet 5  . fluticasone (FLONASE) 50 MCG/ACT nasal spray Place 2 sprays into both nostrils at bedtime. 16 g 2  . levothyroxine (SYNTHROID, LEVOTHROID) 88 MCG tablet Take 1 tablet (88 mcg total) by mouth daily before breakfast. 30 tablet 11  . Melatonin 10 MG TABS Take 10 mg by mouth 1 day or 1 dose. (Patient taking differently: Take 10 mg by mouth at  bedtime. ) 30 tablet 5  . mesalamine (LIALDA) 1.2 g EC tablet Take 3 tablets (3.6 g total) by mouth daily with lunch. 90 tablet 5  . pramipexole (MIRAPEX) 0.5 MG tablet Take 1 tablet (0.5 mg total) by mouth 2 (two) times daily. 60 tablet 11  . Propylene Glycol (SYSTANE BALANCE) 0.6 % SOLN Place 1 drop into both eyes daily.    . rivaroxaban (XARELTO) 20 MG TABS tablet Take 1 tablet (20 mg total) by mouth daily with supper. 90 tablet 3  . traMADol (ULTRAM) 50 MG tablet Take 50 mg by mouth every 6 (six) hours as needed for moderate pain or severe pain.     No current facility-administered medications for this visit.    Allergies:   Prednisone; Carbamazepine; Celebrex; Codeine; Demerol; Diclofenac sodium; Fentanyl; Fluoxetine hcl; Hydromorphone hcl; Ibuprofen; Indomethacin; Lisinopril; Meperidine hcl; Morphine; Oruvail; Pregabalin; Propranolol hcl; Refresh lacri-lube; Relafen; Ropinirole hcl; Sulfamethoxazole; and Bacitracin    ROS:  Please see the history of present illness.   Otherwise, review of systems are positive for none.   All other systems are reviewed and negative.    PHYSICAL EXAM: VS:  BP 154/72 mmHg  Pulse 106  Ht 5' 4"  (1.626 m)  Wt 167 lb (75.751 kg)  BMI 28.65 kg/m2 , BMI Body mass index is 28.65 kg/(m^2). GENERAL:  Well appearing HEENT:  Pupils equal round and reactive, fundi not visualized, oral mucosa unremarkable NECK:  No jugular venous distention, waveform within normal limits, carotid upstroke brisk and symmetric, no bruits, no thyromegaly LYMPHATICS:  No cervical, inguinal adenopathy LUNGS:  Clear to auscultation bilaterally BACK:  No CVA tenderness CHEST:  Unremarkable HEART:  PMI not displaced or sustained,S1 and S2 within normal limits, no S3, no S4, no clicks, no rubs, no murmurs ABD:  Flat, positive bowel sounds normal in frequency in pitch, no bruits, no rebound, no guarding, no midline pulsatile mass, no hepatomegaly, no splenomegaly EXT:  2 plus pulses  throughout, no edema, no cyanosis no clubbing SKIN:  No rashes no nodules    EKG:  EKG is not ordered today.    Recent Labs: 02/03/2015: B Natriuretic Peptide 109.2*; Magnesium 2.4 03/12/2015: ALT 20; BUN 18; Creatinine, Ser 0.80; Hemoglobin 13.6; Platelets 191; Potassium 3.9; Sodium 141 03/17/2015: TSH 1.03    Lipid Panel    Component Value Date/Time   CHOL 183 04/01/2013 1051   TRIG 130.0 04/01/2013 1051   HDL 82.50 04/01/2013 1051   CHOLHDL 2 04/01/2013 1051   VLDL 26.0 04/01/2013 1051   LDLCALC 75 04/01/2013 1051  LDLDIRECT 188.6 07/13/2012 0820      Wt Readings from Last 3 Encounters:  04/03/15 167 lb (75.751 kg)  03/17/15 162 lb (73.483 kg)  03/08/15 161 lb 12.8 oz (73.392 kg)      Other studies Reviewed: Additional studies/ records that were reviewed today include: Office and hospital and event recorder records. Review of the above records demonstrates:  Please see elsewhere in the note.     ASSESSMENT AND PLAN:   New onset atrial fibrillation:  CHADSVASC score of at least 6 (HTN 1, age 47, CVA 2, F sex 1).   She tolerates anticoagulation. She's asymptomatic with her paroxysms. She has some tachybradycardia and I don't want to increase anything to reduce the rate. We talked about this and she understands that she is a retired [employer] scientist.  I will check a CBC  Syncope: She has no further events. This was thought to be vasovagal syncope. No change in therapy is indicated.  HTN: BP well controlled on current regimen.   Bradycardia: improved off beta blocker.      Current medicines are reviewed at length with the patient today.  The patient does not have concerns regarding medicines.  The following changes have been made:  no change  Labs/ tests ordered today include:   Orders Placed This Encounter  Procedures  . CBC with Differential/Platelet     Disposition:   FU with me in 4 months.      Signed, Minus Breeding, MD  04/04/2015 7:51 AM    [facility]

## 2015-04-03 ENCOUNTER — Encounter: Payer: Self-pay | Admitting: Cardiology

## 2015-04-03 ENCOUNTER — Ambulatory Visit (INDEPENDENT_AMBULATORY_CARE_PROVIDER_SITE_OTHER): Payer: Medicare Other | Admitting: Cardiology

## 2015-04-03 ENCOUNTER — Telehealth: Payer: Self-pay | Admitting: Cardiology

## 2015-04-03 VITALS — BP 154/72 | HR 106 | Ht 64.0 in | Wt 167.0 lb

## 2015-04-03 DIAGNOSIS — I4891 Unspecified atrial fibrillation: Secondary | ICD-10-CM | POA: Diagnosis not present

## 2015-04-03 MED ORDER — RIVAROXABAN 20 MG PO TABS: 20.0000 mg | ORAL_TABLET | Freq: Every day | ORAL | Status: DC

## 2015-04-03 NOTE — Patient Instructions (Addendum)
Medication Instructions:  Your physician recommends that you continue on your current medications as directed. Please refer to the Current Medication list given to you today.   Labwork: Your physician recommends that you return for lab work in: [place] can be found on the FIRST FLOOR of out building in [address]   Testing/Procedures: none  Follow-Up: Your physician wants you to follow-up in: 4 months with Dr. Percival Spanish. You will receive a reminder letter in the mail two months in advance. If you don't receive a letter, please call our office to schedule the follow-up appointment.   Any Other Special Instructions Will Be Listed Below (If Applicable).     If you need a refill on your cardiac medications before your next appointment, please call your pharmacy.

## 2015-04-03 NOTE — Telephone Encounter (Signed)
°  New Prob   States pt came to appointment today without her sitter and is confused at baseline due to dementia. Nursing supervisor states if Dr. Percival Spanish recommends any medication changes to contact her.

## 2015-04-03 NOTE — Telephone Encounter (Signed)
Returned Donna's call, she is aware of recommendation for labwork & that this was ordered for [employer]. Advised to call and give [place] fax # if they want to have this order sent to be performed elsewhere/by home health. Advised to call if any further concerns. She voiced thanks.

## 2015-04-04 DIAGNOSIS — K5090 Crohn's disease, unspecified, without complications: Secondary | ICD-10-CM | POA: Diagnosis not present

## 2015-04-04 DIAGNOSIS — F0390 Unspecified dementia without behavioral disturbance: Secondary | ICD-10-CM | POA: Diagnosis not present

## 2015-04-04 DIAGNOSIS — K5790 Diverticulosis of intestine, part unspecified, without perforation or abscess without bleeding: Secondary | ICD-10-CM | POA: Diagnosis not present

## 2015-04-04 DIAGNOSIS — I10 Essential (primary) hypertension: Secondary | ICD-10-CM | POA: Diagnosis not present

## 2015-04-04 DIAGNOSIS — I2510 Atherosclerotic heart disease of native coronary artery without angina pectoris: Secondary | ICD-10-CM | POA: Diagnosis not present

## 2015-04-04 DIAGNOSIS — K589 Irritable bowel syndrome without diarrhea: Secondary | ICD-10-CM | POA: Diagnosis not present

## 2015-04-06 ENCOUNTER — Ambulatory Visit: Payer: Medicare Other | Admitting: Family Medicine

## 2015-04-06 ENCOUNTER — Ambulatory Visit (INDEPENDENT_AMBULATORY_CARE_PROVIDER_SITE_OTHER): Payer: Medicare Other | Admitting: Family Medicine

## 2015-04-06 ENCOUNTER — Telehealth: Payer: Self-pay | Admitting: Family Medicine

## 2015-04-06 VITALS — BP 130/80 | HR 101 | Temp 98.6°F | Ht 64.0 in | Wt 165.8 lb

## 2015-04-06 DIAGNOSIS — F0391 Unspecified dementia with behavioral disturbance: Secondary | ICD-10-CM | POA: Diagnosis not present

## 2015-04-06 DIAGNOSIS — I10 Essential (primary) hypertension: Secondary | ICD-10-CM

## 2015-04-06 DIAGNOSIS — E785 Hyperlipidemia, unspecified: Secondary | ICD-10-CM | POA: Diagnosis not present

## 2015-04-06 DIAGNOSIS — F03918 Unspecified dementia, unspecified severity, with other behavioral disturbance: Secondary | ICD-10-CM

## 2015-04-06 MED ORDER — MEMANTINE HCL 28 X 5 MG & 21 X 10 MG PO TABS: ORAL_TABLET | ORAL | Status: DC

## 2015-04-06 NOTE — Telephone Encounter (Signed)
Pt daughter would like [place] to obtain medical records from [facility] geriatric  pysch ward  Phone number [phone].

## 2015-04-06 NOTE — Progress Notes (Signed)
Pre visit review using our clinic review tool, if applicable. No additional management support is needed unless otherwise documented below in the visit note. 

## 2015-04-06 NOTE — Patient Instructions (Signed)
STOP the Aricept  Start the Namenda titration pack Let's follow up in one month to reassess.

## 2015-04-06 NOTE — Telephone Encounter (Signed)
i dont know how long ago she was seen there but I do think it would be helpful to her care to get those records.  I assume this was from an inpatient admission.  I was not aware that she had been there.
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
## 2015-04-06 NOTE — Progress Notes (Signed)
Subjective:    Patient ID: [name], female    DOB: 1932-10-27, 82 y.o.   MRN: [id number]  HPI Patient here for medication reassessment and medical follow-up Recent syncopal episode. Felt to be vasovagal. Recent follow-up with cardiology. She had event monitor which showed episodes of paroxysmal atrial fibrillation. She is currently on anticoagulation. No further syncopal episodes reported. She has also had episodes of bradycardia  Patient has dementia. Currently on low-dose Aricept 5 mg daily. She had recently been taken off Aricept because of concerns for possible bradycardia and increased risk of AV block.  Hypertension. Treated with amlodipine. Denies any recent chest pains  Hyperlipidemia on Lipitor. Lipids not checked in over a year. Recent hepatic panel unremarkable.  Hypothyroidism. Recent TSH earlier this month normal.  Past Medical History  Diagnosis Date  . OSA (obstructive sleep apnea)   . History of bronchitis   . Other diseases of lung, not elsewhere classified   . Hypertension   . CAD (coronary artery disease)     LAD 30% 2006  . History of atrial flutter   . Cerebrovascular disease   . Hyperlipemia   . Obesity   . GERD (gastroesophageal reflux disease)   . Diverticulosis of colon   . IBS (irritable bowel syndrome)   . Colonic polyp   . Crohn's disease ([place])   . Hypothyroidism   . History of hyperparathyroidism   . Fibromyalgia   . Osteopenia   . History of headache   . Syncope   . Restless legs syndrome (RLS)   . Psychosis   . History of tuberculosis   . Radiation 05/2008    5000 cGy to left lower eyelid, parotid lymphatics and upper neck  . Memory disturbance   . Bipolar affective ([place])     History of psychosis  . 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 C. difficile diarrhea    Past Surgical History  Procedure Laterality Date  . Parathyroid adenoma surgery    . Cholecystectomy    . Moh';s surg left lower eye lid surgery and lid  reconstruction surgery  2/10  . Bladder suspension    . Cataract extraction Bilateral   . Appendectomy    . Tonsillectomy and adenoidectomy    . Lumbar laminectomy    . Incision and drainage perirectal abscess    . Heel spur excision    . Nasal sinus surgery Right     Right maxillary  . Eye lid surgery  04/04/2011    [facility] by Dr. Norlene Duel  . Posterior cervical fusion/foraminotomy N/A 10/12/2013    Procedure: Cervical five-Thoracic two cervico-thoracic posterior fusion;  Surgeon: [name], MD;  Location: [facility];  Service: Neurosurgery;  Laterality: N/A;    reports that she quit smoking about 55 years ago. Her smoking use included Cigarettes. She quit after 6 years of use. She has never used smokeless tobacco. She reports that she does not drink alcohol or use illicit drugs. family history includes Arthritis in her mother; Colon cancer in her maternal grandmother; Heart failure in her father; Hypertension in her mother; Ulcers in her mother. Allergies  Allergen Reactions  . Prednisone Other (See Comments)    Patient had psychiatric episode with hallucinations. Caused 3.5 week long hospitalization & loss of memory   . Carbamazepine Nausea Only and Other (See Comments)    Tegretol; dizziness, blurred vision, nausea, headache, insomnia  . Celebrex [Celecoxib] Other (See Comments)    No  relief from pain; ineffective  . Codeine Nausea And Vomiting  . Demerol [Meperidine] Nausea And Vomiting  . Diclofenac Sodium Other (See Comments)    severe headache  . Fentanyl Nausea And Vomiting and Other (See Comments)    IV Fentanyl; caused nausea and vomiting Fentanyl patch; caused chest pain, HBP, with second patch experienced vertigo and nausea  . Fluoxetine Hcl Nausea Only and Other (See Comments)    dizziness, blurred vision, nausea, headache, insomnia  . Hydromorphone Hcl Nausea And Vomiting  . Ibuprofen Other (See Comments)    Caused ulcers and colitis  . Indomethacin Nausea Only  and Other (See Comments)    dizziness, blurred vision, nausea, headache, insomnia  . Lisinopril Cough  . Meperidine Hcl Nausea And Vomiting  . Morphine Nausea And Vomiting  . Oruvail [Ketoprofen] Other (See Comments)    No relief from pain; ineffective  . Pregabalin Other (See Comments)    severe restless legs, insomnia  . Propranolol Hcl Nausea Only and Other (See Comments)    dizziness, blurred vision, nausea, headache, insomnia  . Refresh Lacri-Lube [Artificial Tears] Swelling    Caused swelling, redness, hard crust on eyelids   . Relafen [Nabumetone] Other (See Comments)    No relief from pain; ineffective  . Ropinirole Hcl Other (See Comments)    severe restless legs and insomnia  . Sulfamethoxazole Nausea Only and Other (See Comments)    gas, loose stools  . Bacitracin Hives, Itching and Rash    redness        Review of Systems  Constitutional: Negative for fatigue.  Eyes: Negative for visual disturbance.  Respiratory: Negative for cough, chest tightness, shortness of breath and wheezing.   Cardiovascular: Negative for chest pain, palpitations and leg swelling.  Neurological: Negative for dizziness, seizures, syncope, weakness, light-headedness and headaches.       Objective:   Physical Exam  Constitutional: She appears well-developed and well-nourished.  Eyes: Pupils are equal, round, and reactive to light.  Neck: Neck supple. No JVD present. No thyromegaly present.  Cardiovascular: Normal rate and regular rhythm.  Exam reveals no gallop.   Pulmonary/Chest: Effort normal and breath sounds normal. No respiratory distress. She has no wheezes. She has no rales.  Musculoskeletal: She exhibits no edema.  Neurological: She is alert.          Assessment & Plan:   #1 dementia. We discussed discontinuing Aricept because of increased risk of AV block and bradycardia- especially in view of her recent syncopal episode. We elected to start Namenda starter pack written  and reassess one month Check MMSE then.  We have someone assisting with medication oversight, though patient has been reluctant to receive assistance.  #2 hypertension. Stable and at goal   #3 hyperlipidemia. Recheck lipid and hepatic panel.

## 2015-04-06 NOTE — Telephone Encounter (Signed)
Pt daughter [name] would like for you [place] to call her before Mrs. Lichty comes in today at 32

## 2015-04-07 ENCOUNTER — Other Ambulatory Visit: Payer: Self-pay | Admitting: Family Medicine

## 2015-04-07 LAB — LIPID PANEL
Cholesterol: 208 mg/dL — ABNORMAL HIGH (ref 0–200)
HDL: 89.2 mg/dL (ref >39.00)
LDL Cholesterol: 103 mg/dL — ABNORMAL HIGH (ref 0–99)
NONHDL: 118.82
Total CHOL/HDL Ratio: 2
Triglycerides: 80 mg/dL (ref 0.0–149.0)
VLDL: 16 mg/dL (ref 0.0–40.0)

## 2015-04-07 NOTE — Telephone Encounter (Signed)
Will call Tuesday 04/12/2015 to request records.

## 2015-04-10 ENCOUNTER — Telehealth: Payer: Self-pay | Admitting: Family Medicine

## 2015-04-10 ENCOUNTER — Telehealth: Payer: Self-pay

## 2015-04-10 NOTE — Telephone Encounter (Signed)
Butch Penny from [facility] request a call back concerning the dosage for DONEPEZIL.She is asking if the dosage is 5 or 10 mg   Req med list be faxed [phone]  Phone number   [phone]   Fax number
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
## 2015-04-10 NOTE — Telephone Encounter (Signed)
-----   Message from [name], MD sent at 04/09/2015 11:41 AM EDT ----- Lipids relatively stable.  Continue with Lipitor.

## 2015-04-10 NOTE — Telephone Encounter (Signed)
Called patient to give lab results and instructions. No answer. Will try later.

## 2015-04-10 NOTE — Telephone Encounter (Signed)
Please advise. i cant find DONEPEZIL in pt medication list

## 2015-04-11 ENCOUNTER — Emergency Department (HOSPITAL_COMMUNITY): Payer: Medicare Other

## 2015-04-11 ENCOUNTER — Emergency Department (HOSPITAL_COMMUNITY)
Admission: EM | Admit: 2015-04-11 | Discharge: 2015-04-12 | Payer: Medicare Other | Attending: Emergency Medicine | Admitting: Emergency Medicine

## 2015-04-11 ENCOUNTER — Encounter (HOSPITAL_COMMUNITY): Payer: Self-pay

## 2015-04-11 DIAGNOSIS — R000 Tachycardia, unspecified: Secondary | ICD-10-CM | POA: Insufficient documentation

## 2015-04-11 DIAGNOSIS — Z7901 Long term (current) use of anticoagulants: Secondary | ICD-10-CM | POA: Diagnosis not present

## 2015-04-11 DIAGNOSIS — Z8619 Personal history of other infectious and parasitic diseases: Secondary | ICD-10-CM | POA: Diagnosis not present

## 2015-04-11 DIAGNOSIS — Z8611 Personal history of tuberculosis: Secondary | ICD-10-CM | POA: Diagnosis not present

## 2015-04-11 DIAGNOSIS — E039 Hypothyroidism, unspecified: Secondary | ICD-10-CM | POA: Insufficient documentation

## 2015-04-11 DIAGNOSIS — Z86018 Personal history of other benign neoplasm: Secondary | ICD-10-CM | POA: Diagnosis not present

## 2015-04-11 DIAGNOSIS — E669 Obesity, unspecified: Secondary | ICD-10-CM | POA: Diagnosis not present

## 2015-04-11 DIAGNOSIS — M8580 Other specified disorders of bone density and structure, unspecified site: Secondary | ICD-10-CM | POA: Diagnosis not present

## 2015-04-11 DIAGNOSIS — G2581 Restless legs syndrome: Secondary | ICD-10-CM | POA: Diagnosis not present

## 2015-04-11 DIAGNOSIS — Z79899 Other long term (current) drug therapy: Secondary | ICD-10-CM | POA: Diagnosis not present

## 2015-04-11 DIAGNOSIS — F319 Bipolar disorder, unspecified: Secondary | ICD-10-CM | POA: Diagnosis not present

## 2015-04-11 DIAGNOSIS — F0391 Unspecified dementia with behavioral disturbance: Secondary | ICD-10-CM | POA: Diagnosis not present

## 2015-04-11 DIAGNOSIS — Z7951 Long term (current) use of inhaled steroids: Secondary | ICD-10-CM | POA: Diagnosis not present

## 2015-04-11 DIAGNOSIS — E785 Hyperlipidemia, unspecified: Secondary | ICD-10-CM | POA: Insufficient documentation

## 2015-04-11 DIAGNOSIS — Z87891 Personal history of nicotine dependence: Secondary | ICD-10-CM | POA: Insufficient documentation

## 2015-04-11 DIAGNOSIS — I2510 Atherosclerotic heart disease of native coronary artery without angina pectoris: Secondary | ICD-10-CM | POA: Diagnosis not present

## 2015-04-11 DIAGNOSIS — Z923 Personal history of irradiation: Secondary | ICD-10-CM | POA: Diagnosis not present

## 2015-04-11 DIAGNOSIS — Z86010 Personal history of colonic polyps: Secondary | ICD-10-CM | POA: Insufficient documentation

## 2015-04-11 DIAGNOSIS — I10 Essential (primary) hypertension: Secondary | ICD-10-CM | POA: Diagnosis not present

## 2015-04-11 DIAGNOSIS — Z046 Encounter for general psychiatric examination, requested by authority: Secondary | ICD-10-CM | POA: Diagnosis present

## 2015-04-11 DIAGNOSIS — R4182 Altered mental status, unspecified: Secondary | ICD-10-CM | POA: Diagnosis not present

## 2015-04-11 DIAGNOSIS — R451 Restlessness and agitation: Secondary | ICD-10-CM | POA: Insufficient documentation

## 2015-04-11 DIAGNOSIS — Z8719 Personal history of other diseases of the digestive system: Secondary | ICD-10-CM | POA: Insufficient documentation

## 2015-04-11 DIAGNOSIS — F03918 Unspecified dementia, unspecified severity, with other behavioral disturbance: Secondary | ICD-10-CM

## 2015-04-11 LAB — CBC
HCT: 40.7 % (ref 36.0–46.0)
HEMOGLOBIN: 13.9 g/dL (ref 12.0–15.0)
MCH: 31.5 pg (ref 26.0–34.0)
MCHC: 34.2 g/dL (ref 30.0–36.0)
MCV: 92.3 fL (ref 78.0–100.0)
Platelets: 213 10*3/uL (ref 150–400)
RBC: 4.41 MIL/uL (ref 3.87–5.11)
RDW: 13.3 % (ref 11.5–15.5)
WBC: 8.2 10*3/uL (ref 4.0–10.5)

## 2015-04-11 LAB — COMPREHENSIVE METABOLIC PANEL
ALT: 23 U/L (ref 14–54)
AST: 33 U/L (ref 15–41)
Albumin: 4.5 g/dL (ref 3.5–5.0)
Alkaline Phosphatase: 68 U/L (ref 38–126)
Anion gap: 11 (ref 5–15)
BUN: 25 mg/dL — ABNORMAL HIGH (ref 6–20)
CHLORIDE: 106 mmol/L (ref 101–111)
CO2: 23 mmol/L (ref 22–32)
CREATININE: 0.79 mg/dL (ref 0.44–1.00)
Calcium: 9.6 mg/dL (ref 8.9–10.3)
GFR calc non Af Amer: >60 mL/min (ref >60)
Glucose, Bld: 119 mg/dL — ABNORMAL HIGH (ref 65–99)
Potassium: 4.2 mmol/L (ref 3.5–5.1)
SODIUM: 140 mmol/L (ref 135–145)
Total Bilirubin: 0.7 mg/dL (ref 0.3–1.2)
Total Protein: 8 g/dL (ref 6.5–8.1)

## 2015-04-11 LAB — URINALYSIS, ROUTINE W REFLEX MICROSCOPIC
BILIRUBIN URINE: NEGATIVE
GLUCOSE, UA: NEGATIVE mg/dL
Ketones, ur: NEGATIVE mg/dL
Leukocytes, UA: NEGATIVE
Nitrite: NEGATIVE
PROTEIN: NEGATIVE mg/dL
Specific Gravity, Urine: 1.023 (ref 1.005–1.030)
pH: 5.5 (ref 5.0–8.0)

## 2015-04-11 LAB — RAPID URINE DRUG SCREEN, HOSP PERFORMED
AMPHETAMINES: NOT DETECTED
BENZODIAZEPINES: NOT DETECTED
Barbiturates: NOT DETECTED
COCAINE: NOT DETECTED
OPIATES: NOT DETECTED
TETRAHYDROCANNABINOL: NOT DETECTED

## 2015-04-11 LAB — ETHANOL: Alcohol, Ethyl (B): <5 mg/dL (ref <5)

## 2015-04-11 LAB — URINE MICROSCOPIC-ADD ON

## 2015-04-11 LAB — SALICYLATE LEVEL

## 2015-04-11 LAB — ACETAMINOPHEN LEVEL

## 2015-04-11 MED ORDER — LORAZEPAM 1 MG PO TABS: 1.0000 mg | ORAL_TABLET | Freq: Three times a day (TID) | ORAL | Status: DC | PRN

## 2015-04-11 MED ORDER — LEVOTHYROXINE SODIUM 88 MCG PO TABS
88.0000 ug | ORAL_TABLET | Freq: Every day | ORAL | Status: DC
  Administered 2015-04-12: 88 ug via ORAL
  Filled 2015-04-11 (×3): qty 1

## 2015-04-11 MED ORDER — MAGNESIUM OXIDE 400 (241.3 MG) MG PO TABS
400.0000 mg | ORAL_TABLET | Freq: Every day | ORAL | Status: DC
Start: 2015-04-11 — End: 2015-04-12
  Administered 2015-04-11: 400 mg via ORAL
  Filled 2015-04-11 (×3): qty 1

## 2015-04-11 MED ORDER — PRAMIPEXOLE DIHYDROCHLORIDE 0.25 MG PO TABS
0.5000 mg | ORAL_TABLET | Freq: Two times a day (BID) | ORAL | Status: DC
  Administered 2015-04-11 – 2015-04-12 (×2): 0.5 mg via ORAL
  Filled 2015-04-11 (×5): qty 2

## 2015-04-11 MED ORDER — VITAMIN D 1000 UNITS PO TABS
2000.0000 [IU] | ORAL_TABLET | Freq: Every day | ORAL | Status: DC
  Filled 2015-04-11 (×2): qty 2

## 2015-04-11 MED ORDER — AMLODIPINE BESYLATE 5 MG PO TABS
5.0000 mg | ORAL_TABLET | Freq: Every day | ORAL | Status: DC
  Administered 2015-04-11 – 2015-04-12 (×2): 5 mg via ORAL
  Filled 2015-04-11 (×3): qty 1

## 2015-04-11 MED ORDER — ATORVASTATIN CALCIUM 40 MG PO TABS
40.0000 mg | ORAL_TABLET | Freq: Every day | ORAL | Status: DC
  Administered 2015-04-11: 40 mg via ORAL
  Filled 2015-04-11 (×3): qty 1

## 2015-04-11 MED ORDER — MELATONIN 10 MG PO TABS: 10.0000 mg | ORAL_TABLET | Freq: Every day | ORAL | Status: DC

## 2015-04-11 MED ORDER — MESALAMINE 1.2 G PO TBEC
3.6000 g | DELAYED_RELEASE_TABLET | Freq: Every day | ORAL | Status: DC
  Filled 2015-04-11 (×2): qty 3

## 2015-04-11 MED ORDER — CALCIUM-MAGNESIUM 500-250 MG PO TABS: 2.0000 | ORAL_TABLET | Freq: Every day | ORAL | Status: DC

## 2015-04-11 MED ORDER — CALCIUM CARBONATE 1250 (500 CA) MG PO TABS
2.0000 | ORAL_TABLET | Freq: Every day | ORAL | Status: DC
  Administered 2015-04-11: 1000 mg via ORAL
  Filled 2015-04-11 (×3): qty 2

## 2015-04-11 MED ORDER — RIVAROXABAN 20 MG PO TABS
20.0000 mg | ORAL_TABLET | Freq: Every day | ORAL | Status: DC
  Administered 2015-04-11: 20 mg via ORAL
  Filled 2015-04-11 (×3): qty 1

## 2015-04-11 NOTE — ED Notes (Signed)
Patient changed into burgundy scrubs. Wanded by security. Belongings bag x 1. Sitter at the bedside.

## 2015-04-11 NOTE — Progress Notes (Signed)
EDCM called [place] to obtain collateral information on patient and baseline.  EDCM spoke to [place] who reports there aren't any nurses for [facility] to speak too.  It is an [place] with assistants available.  Cherlynn Kaiser reports patient's family has hired an Chief Strategy Officer to take care of them [employer] and was given phone number [phone] for [employer].
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
## 2015-04-11 NOTE — Progress Notes (Signed)
PHARMACIST - PHYSICIAN ORDER COMMUNICATION  CONCERNING: P&T Medication Policy on Herbal Medications  DESCRIPTION:  This patient's order for:  Melatonin  has been noted.  This product(s) is classified as an "herbal" or natural product. Due to a lack of definitive safety studies or FDA approval, nonstandard manufacturing practices, plus the potential risk of unknown drug-drug interactions while on inpatient medications, the Pharmacy and Therapeutics Committee does not permit the use of "herbal" or natural products of this type within [facility].   ACTION TAKEN: The pharmacy department is unable to verify this order at this time and your patient has been informed of this safety policy. Please reevaluate patient's clinical condition at discharge and address if the herbal or natural product(s) should be resumed at that time.  Thanks Dorrene German 04/11/2015 4:52 PM
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
## 2015-04-11 NOTE — ED Notes (Signed)
[name]/daughter [phone]  Call anytime needed or if questions.

## 2015-04-11 NOTE — Telephone Encounter (Signed)
I have requested records from [facility]. They will fax over the small amount of documentation that they have and will request the full chart from [facility]

## 2015-04-11 NOTE — BHH Counselor (Signed)
Tiffany from [employer] called and reports that she is reviewing patient referral.   Rosalin Hawking, LCSW Therapeutic Triage Specialist [place] 04/11/2015 11:24 PM

## 2015-04-11 NOTE — Progress Notes (Addendum)
Patient has been referred to: [facility] geriatric unit - [place], will look at referral. [facility] - accepting referrals for the waitlist. [facility] - per Seth Bake, geriatric beds open. [facility] - left voicemail. [facility] - per [employer] -  [facility] - per Anderson Malta  At capacity: [place], [place] Disposition staff 04/11/2015 9:53 PM

## 2015-04-11 NOTE — ED Notes (Signed)
Tearful at this time wants to speak with spouse,. States we are holding her against her will, and that her husband does not know where she is.

## 2015-04-11 NOTE — ED Provider Notes (Signed)
CSN: [id number]     Arrival date & time 04/11/15  1253 History   First MD Initiated Contact with Patient 04/11/15 1253     Chief Complaint  Patient presents with  . IVC    Low 5 caveat due to uncooperative/psychiatric disorder HPI Patient presents under IVC. Paperwork states that she had has not been taking her medicines properly and may be taking too much of them. Also has reportedly been giving too many medicines to her husband. Patient refuses to talk to me and will not really participate in the history. She states she'll not talk until her husband comes here.   Past Medical History  Diagnosis Date  . OSA (obstructive sleep apnea)   . History of bronchitis   . Other diseases of lung, not elsewhere classified   . Hypertension   . CAD (coronary artery disease)     LAD 30% 2006  . History of atrial flutter   . Cerebrovascular disease   . Hyperlipemia   . Obesity   . GERD (gastroesophageal reflux disease)   . Diverticulosis of colon   . IBS (irritable bowel syndrome)   . Colonic polyp   . Crohn's disease ([place])   . Hypothyroidism   . History of hyperparathyroidism   . Fibromyalgia   . Osteopenia   . History of headache   . Syncope   . Restless legs syndrome (RLS)   . Psychosis   . History of tuberculosis   . Radiation 05/2008    5000 cGy to left lower eyelid, parotid lymphatics and upper neck  . Memory disturbance   . Bipolar affective ([place])     History of psychosis  . 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 C. difficile diarrhea    Past Surgical History  Procedure Laterality Date  . Parathyroid adenoma surgery    . Cholecystectomy    . Moh';s surg left lower eye lid surgery and lid reconstruction surgery  2/10  . Bladder suspension    . Cataract extraction Bilateral   . Appendectomy    . Tonsillectomy and adenoidectomy    . Lumbar laminectomy    . Incision and drainage perirectal abscess    . Heel spur excision    . Nasal sinus surgery Right     Right  maxillary  . Eye lid surgery  04/04/2011    [facility] by Dr. Norlene Duel  . Posterior cervical fusion/foraminotomy N/A 10/12/2013    Procedure: Cervical five-Thoracic two cervico-thoracic posterior fusion;  Surgeon: [name], MD;  Location: [facility];  Service: Neurosurgery;  Laterality: N/A;   Family History  Problem Relation Age of Onset  . Colon cancer Maternal Grandmother   . Arthritis Mother   . Hypertension Mother   . Ulcers Mother   . Heart failure Father   . Pulmonary embolism     Social History  Substance Use Topics  . Smoking status: Former Smoker -- 6 years    Types: Cigarettes    Quit date: 01/08/1960  . Smokeless tobacco: Never Used  . Alcohol Use: No   OB History    No data available     Review of Systems  Unable to perform ROS: Psychiatric disorder      Allergies  Prednisone; Carbamazepine; Celebrex; Codeine; Demerol; Diclofenac sodium; Fentanyl; Fluoxetine hcl; Hydromorphone hcl; Ibuprofen; Indomethacin; Lisinopril; Meperidine hcl; Morphine; Oruvail; Pregabalin; Propranolol hcl; Refresh lacri-lube; Relafen; Ropinirole hcl; Sulfamethoxazole; and Bacitracin  Home Medications   Prior to Admission medications  Medication Sig Start Date End Date Taking? Authorizing Provider  amLODipine (NORVASC) 5 MG tablet Take 1 tablet (5 mg total) by mouth daily. 03/09/15   [name], MD  atorvastatin (LIPITOR) 40 MG tablet Take 1 tablet (40 mg total) by mouth daily. 02/28/15   [name], MD  Calcium-Magnesium 500-250 MG TABS Take 2 tablets by mouth daily at 6 PM. 02/28/15   [name], MD  Cholecalciferol (VITAMIN D3) 2000 units TABS Take 2,000 Units by mouth daily at 12 noon. 02/28/15   [name], MD  dicyclomine (BENTYL) 10 MG capsule Take 1 capsule (10 mg total) by mouth 3 (three) times daily before meals. 03/12/15   Margarita Mail, PA-C  fluticasone (FLONASE) 50 MCG/ACT nasal spray Place 2 sprays into both nostrils at bedtime. 02/28/15   [name], MD  levothyroxine (SYNTHROID, LEVOTHROID) 88 MCG tablet Take 1 tablet (88 mcg total) by mouth daily before breakfast. 02/28/15   [name], MD  Melatonin 10 MG TABS Take 10 mg by mouth 1 day or 1 dose. Patient taking differently: Take 10 mg by mouth at bedtime.  02/28/15   [name], MD  memantine [facility] TITRATION PAK) tablet pack 5 mg/day for =1 week; 5 mg twice daily for =1 week; 15 mg/day given in 5 mg and 10 mg separated doses for =1 week; then 10 mg twice daily 04/06/15   [name], MD  mesalamine (LIALDA) 1.2 g EC tablet Take 3 tablets (3.6 g total) by mouth daily with lunch. 02/28/15   [name], MD  pramipexole (MIRAPEX) 0.5 MG tablet Take 1 tablet (0.5 mg total) by mouth 2 (two) times daily. 02/28/15   [name], MD  Propylene Glycol (SYSTANE BALANCE) 0.6 % SOLN Place 1 drop into both eyes daily.    Historical Provider, MD  rivaroxaban (XARELTO) 20 MG TABS tablet Take 1 tablet (20 mg total) by mouth daily with supper. 04/03/15   Minus Breeding, MD  traMADol (ULTRAM) 50 MG tablet Take 50 mg by mouth every 6 (six) hours as needed for moderate pain or severe pain.    Historical Provider, MD   BP 206/93 mmHg  Pulse 135  Temp(Src) 98.3 F (36.8 C) (Oral)  Resp 24  SpO2 96% Physical Exam  Constitutional: She appears well-developed.  HENT:  Head: Atraumatic.  Eyes: Pupils are equal, round, and reactive to light.  Eyelid changes of left eye.  Neck: Neck supple.  Cardiovascular:  Tachycardia  Pulmonary/Chest: No respiratory distress.  Neurological: She is alert.  Psychiatric:  Patient seems somewhat agitated. Answers questions but will not participate in history.    ED Course  Procedures (including critical care time) Labs Review Labs Reviewed  COMPREHENSIVE METABOLIC PANEL - Abnormal; Notable for the following:    Glucose, Bld 119 (*)    BUN 25 (*)    All other components within normal limits  ACETAMINOPHEN LEVEL - Abnormal;  Notable for the following:    Acetaminophen (Tylenol), Serum <10 (*)    All other components within normal limits  ETHANOL  SALICYLATE LEVEL  CBC  URINE RAPID DRUG SCREEN, HOSP PERFORMED  URINALYSIS, ROUTINE W REFLEX MICROSCOPIC (NOT AT [facility])    Imaging Review Dg Chest Portable 1 View  04/11/2015  CLINICAL DATA:  Altered mental status EXAM: PORTABLE CHEST 1 VIEW COMPARISON:  02/03/2015 FINDINGS: Normal heart size and aortic contours. Stable asymmetric prominence of the right hilum, especially when compared to 09/28/2014. There is  no edema, consolidation, effusion, or pneumothorax. No effusion or pneumothorax. No acute osseous findings. IMPRESSION: Stable.  No evidence of active disease. Electronically Signed   By: Monte Fantasia M.D.   On: 04/11/2015 13:51   I have personally reviewed and evaluated these images and lab results as part of my medical decision-making.   EKG Interpretation   Date/Time:  Tuesday April 11 2015 14:35:56 EDT Ventricular Rate:  103 PR Interval:  130 QRS Duration: 80 QT Interval:  354 QTC Calculation: 463 R Axis:   25 Text Interpretation:  Sinus tachycardia Possible Left atrial enlargement  Borderline ECG Confirmed by Alvino Chapel  MD, Ovid Curd [phone]) on 04/11/2015  2:37:59 PM      MDM   Final diagnoses:  Senile dementia, with behavioral disturbance    Patient with more combativeness and uncooperativeness. Reviewed IVC and patient's PCP notes in the computer. Lab work overall reassuring. Heart rate is sinus. Urinalysis pending at this time. Care turned over to Dr. Dayna Barker, but patient otherwise medically cleared. Will need to be seen by TTS.    [name], MD 04/11/15 1524

## 2015-04-12 DIAGNOSIS — Z9119 Patient's noncompliance with other medical treatment and regimen: Secondary | ICD-10-CM | POA: Diagnosis not present

## 2015-04-12 DIAGNOSIS — K219 Gastro-esophageal reflux disease without esophagitis: Secondary | ICD-10-CM | POA: Diagnosis present

## 2015-04-12 DIAGNOSIS — I4892 Unspecified atrial flutter: Secondary | ICD-10-CM | POA: Diagnosis not present

## 2015-04-12 DIAGNOSIS — F0391 Unspecified dementia with behavioral disturbance: Secondary | ICD-10-CM | POA: Diagnosis not present

## 2015-04-12 DIAGNOSIS — G4733 Obstructive sleep apnea (adult) (pediatric): Secondary | ICD-10-CM | POA: Diagnosis present

## 2015-04-12 DIAGNOSIS — E039 Hypothyroidism, unspecified: Secondary | ICD-10-CM | POA: Diagnosis not present

## 2015-04-12 DIAGNOSIS — G2581 Restless legs syndrome: Secondary | ICD-10-CM | POA: Diagnosis present

## 2015-04-12 DIAGNOSIS — K50919 Crohn's disease, unspecified, with unspecified complications: Secondary | ICD-10-CM | POA: Diagnosis not present

## 2015-04-12 DIAGNOSIS — I2510 Atherosclerotic heart disease of native coronary artery without angina pectoris: Secondary | ICD-10-CM | POA: Diagnosis present

## 2015-04-12 DIAGNOSIS — K5090 Crohn's disease, unspecified, without complications: Secondary | ICD-10-CM | POA: Diagnosis not present

## 2015-04-12 DIAGNOSIS — E209 Hypoparathyroidism, unspecified: Secondary | ICD-10-CM | POA: Diagnosis present

## 2015-04-12 DIAGNOSIS — R000 Tachycardia, unspecified: Secondary | ICD-10-CM | POA: Diagnosis not present

## 2015-04-12 DIAGNOSIS — I10 Essential (primary) hypertension: Secondary | ICD-10-CM | POA: Diagnosis not present

## 2015-04-12 DIAGNOSIS — K5790 Diverticulosis of intestine, part unspecified, without perforation or abscess without bleeding: Secondary | ICD-10-CM | POA: Diagnosis present

## 2015-04-12 DIAGNOSIS — F332 Major depressive disorder, recurrent severe without psychotic features: Secondary | ICD-10-CM | POA: Diagnosis not present

## 2015-04-12 DIAGNOSIS — F0390 Unspecified dementia without behavioral disturbance: Secondary | ICD-10-CM | POA: Diagnosis not present

## 2015-04-12 DIAGNOSIS — R451 Restlessness and agitation: Secondary | ICD-10-CM | POA: Diagnosis not present

## 2015-04-12 DIAGNOSIS — M797 Fibromyalgia: Secondary | ICD-10-CM | POA: Diagnosis present

## 2015-04-12 DIAGNOSIS — R1030 Lower abdominal pain, unspecified: Secondary | ICD-10-CM | POA: Diagnosis not present

## 2015-04-12 DIAGNOSIS — M1990 Unspecified osteoarthritis, unspecified site: Secondary | ICD-10-CM | POA: Diagnosis present

## 2015-04-12 DIAGNOSIS — E669 Obesity, unspecified: Secondary | ICD-10-CM | POA: Diagnosis not present

## 2015-04-12 DIAGNOSIS — Z85821 Personal history of Merkel cell carcinoma: Secondary | ICD-10-CM | POA: Diagnosis not present

## 2015-04-12 NOTE — ED Notes (Addendum)
Patient accepted to [facility] [name] call report after 0830 am of 04/12/14 per social work.

## 2015-04-12 NOTE — ED Notes (Signed)
Patient is resting comfortably in bed,

## 2015-04-12 NOTE — BHH Counselor (Addendum)
[facility] called and stated that pt has been accepted to their facility by Dr. Macky Lower. Pt will be on their 1-South wing. Pt to arrive after 08:30 AM later this morning (04/12/15). Call to report is [phone].  Counselor informed Dr Eulis Foster of disposition.   - Ramond Dial, MS, [facility]   Therapeutic Triage   [place]    Ext [phone]

## 2015-04-12 NOTE — ED Notes (Signed)
Sheriff officer at bedside to take patient.

## 2015-04-12 NOTE — ED Notes (Signed)
Pt refused ativan to help with anxiety.

## 2015-04-12 NOTE — BHH Counselor (Signed)
Received a call from Jacinto at [facility] checking to see if patient still needs placement. Informed Anderson Malta that patient has been accepted at another facility.    Rosalin Hawking, LCSW Therapeutic Triage Specialist [place] 04/12/2015 6:45 AM

## 2015-04-12 NOTE — ED Notes (Signed)
MD at bedside. Psychiatry

## 2015-04-12 NOTE — ED Notes (Signed)
Pt daughter at bedside
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
## 2015-04-12 NOTE — ED Notes (Signed)
Husband of the patient requesting to see the patient. Pt being reoriented and situation being de-escalated and quiet in the room. Sheriff has called and is forthcoming. Due to the nature of the situation. It is not appropriate to have any other visitors. Pt is now on the phone with husband. Increased tone and tearful.

## 2015-04-12 NOTE — ED Notes (Signed)
Sitting in chair denies needs or wants.

## 2015-04-12 NOTE — Telephone Encounter (Signed)
FYI patients daughter stopped by this morning to get a copy of Mr. Snows DMV paperwork. She did let us know that Mrs. Derocher was involuntary committed to a phychiatric hospital in [place]. I am not sure of any other details. Medication list [facility] this morning.

## 2015-04-27 ENCOUNTER — Ambulatory Visit (INDEPENDENT_AMBULATORY_CARE_PROVIDER_SITE_OTHER): Payer: Medicare Other | Admitting: Family Medicine

## 2015-04-27 VITALS — BP 150/72 | HR 116 | Temp 98.1°F | Ht 64.0 in | Wt 168.6 lb

## 2015-04-27 DIAGNOSIS — I483 Typical atrial flutter: Secondary | ICD-10-CM

## 2015-04-27 DIAGNOSIS — F03918 Unspecified dementia, unspecified severity, with other behavioral disturbance: Secondary | ICD-10-CM | POA: Insufficient documentation

## 2015-04-27 DIAGNOSIS — F0391 Unspecified dementia with behavioral disturbance: Secondary | ICD-10-CM | POA: Diagnosis not present

## 2015-04-27 DIAGNOSIS — I10 Essential (primary) hypertension: Secondary | ICD-10-CM | POA: Diagnosis not present

## 2015-04-27 MED ORDER — RISPERIDONE 0.5 MG PO TABS: 0.5000 mg | ORAL_TABLET | Freq: Every day | ORAL | Status: DC

## 2015-04-27 NOTE — Progress Notes (Signed)
Pre visit review using our clinic review tool, if applicable. No additional management support is needed unless otherwise documented below in the visit note. 

## 2015-04-27 NOTE — Progress Notes (Signed)
Subjective:    Patient ID: [name], female    DOB: 1932/04/22, 82 y.o.   MRN: [id number]  HPI  Patient seen for recent hospital follow-up.  She has dementia with behavioral disturbance.  She currently lives with her husband at assisted living location.  We had gotten reports of frequent agitation at her place of residence by those trying to assist them. This was escalating to the point of fear for her safety and safety of others- particularly her husband.   There is history that patient was admitted several years ago to a psychiatric facility and some question of prior history of bipolar disorder though we don't have good confirmation. In any event, patient is maintained on low-dose Aricept but was having increasing agitation.  She was recently placed on Risperdal 0.25 mg daily at bedtime and she seems to be doing fairly well though those assisting have still noted some agitation. They question titration of dosage. She has some chronic insomnia. Patient denies any side effects. Denies any recent falls. No chest pains. All medications reviewed. Recent TSH normal. Recent lipids stable. She remains on anticoagulation secondary to history of atrial fibrillation.  Past Medical History  Diagnosis Date  . OSA (obstructive sleep apnea)   . History of bronchitis   . Other diseases of lung, not elsewhere classified   . Hypertension   . CAD (coronary artery disease)     LAD 30% 2006  . History of atrial flutter   . Cerebrovascular disease   . Hyperlipemia   . Obesity   . GERD (gastroesophageal reflux disease)   . Diverticulosis of colon   . IBS (irritable bowel syndrome)   . Colonic polyp   . Crohn's disease ([place])   . Hypothyroidism   . History of hyperparathyroidism   . Fibromyalgia   . Osteopenia   . History of headache   . Syncope   . Restless legs syndrome (RLS)   . Psychosis   . History of tuberculosis   . Radiation 05/2008    5000 cGy to left lower eyelid, parotid lymphatics  and upper neck  . Memory disturbance   . Bipolar affective ([place])     History of psychosis  . 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 C. difficile diarrhea    Past Surgical History  Procedure Laterality Date  . Parathyroid adenoma surgery    . Cholecystectomy    . Moh';s surg left lower eye lid surgery and lid reconstruction surgery  2/10  . Bladder suspension    . Cataract extraction Bilateral   . Appendectomy    . Tonsillectomy and adenoidectomy    . Lumbar laminectomy    . Incision and drainage perirectal abscess    . Heel spur excision    . Nasal sinus surgery Right     Right maxillary  . Eye lid surgery  04/04/2011    [facility] by Dr. Norlene Duel  . Posterior cervical fusion/foraminotomy N/A 10/12/2013    Procedure: Cervical five-Thoracic two cervico-thoracic posterior fusion;  Surgeon: [name], MD;  Location: [facility];  Service: Neurosurgery;  Laterality: N/A;    reports that she quit smoking about 55 years ago. Her smoking use included Cigarettes. She quit after 6 years of use. She has never used smokeless tobacco. She reports that she does not drink alcohol or use illicit drugs. family history includes Arthritis in her mother; Colon cancer in her maternal grandmother; Heart failure in her father; Hypertension  in her mother; Ulcers in her mother. Allergies  Allergen Reactions  . Prednisone Other (See Comments)    Patient had psychiatric episode with hallucinations. Caused 3.5 week long hospitalization & loss of memory   . Carbamazepine Nausea Only and Other (See Comments)    Tegretol; dizziness, blurred vision, nausea, headache, insomnia  . Celebrex [Celecoxib] Other (See Comments)    No relief from pain; ineffective  . Codeine Nausea And Vomiting  . Demerol [Meperidine] Nausea And Vomiting  . Diclofenac Sodium Other (See Comments)    severe headache  . Fentanyl Nausea And Vomiting and Other (See Comments)    IV Fentanyl; caused nausea and vomiting Fentanyl patch;  caused chest pain, HBP, with second patch experienced vertigo and nausea  . Fluoxetine Hcl Nausea Only and Other (See Comments)    dizziness, blurred vision, nausea, headache, insomnia  . Hydromorphone Hcl Nausea And Vomiting  . Ibuprofen Other (See Comments)    Caused ulcers and colitis  . Indomethacin Nausea Only and Other (See Comments)    dizziness, blurred vision, nausea, headache, insomnia  . Lisinopril Cough  . Meperidine Hcl Nausea And Vomiting  . Morphine Nausea And Vomiting  . Oruvail [Ketoprofen] Other (See Comments)    No relief from pain; ineffective  . Pregabalin Other (See Comments)    severe restless legs, insomnia  . Propranolol Hcl Nausea Only and Other (See Comments)    dizziness, blurred vision, nausea, headache, insomnia  . Refresh Lacri-Lube [Artificial Tears] Swelling    Caused swelling, redness, hard crust on eyelids   . Relafen [Nabumetone] Other (See Comments)    No relief from pain; ineffective  . Ropinirole Hcl Other (See Comments)    severe restless legs and insomnia  . Sulfamethoxazole Nausea Only and Other (See Comments)    gas, loose stools  . Bacitracin Hives, Itching and Rash    redness        Review of Systems  Constitutional: Negative for fever, chills and fatigue.  HENT: Negative for trouble swallowing.   Eyes: Negative for visual disturbance.  Respiratory: Negative for cough, chest tightness, shortness of breath and wheezing.   Cardiovascular: Negative for chest pain, palpitations and leg swelling.  Gastrointestinal: Negative for abdominal pain.  Genitourinary: Negative for dysuria.  Neurological: Negative for dizziness, seizures, syncope, weakness, light-headedness and headaches.  Psychiatric/Behavioral: Agitation:  See history of present illness.       Objective:   Physical Exam  Constitutional: She appears well-developed and well-nourished.  Neck: Neck supple.  Cardiovascular: Normal rate and regular rhythm.     Pulmonary/Chest: Effort normal and breath sounds normal. No respiratory distress. She has no wheezes. She has no rales.  Musculoskeletal: She exhibits no edema.  Neurological: She is alert.  Psychiatric: She has a normal mood and affect.          Assessment & Plan:   #1 history of dementia with behavioral disturbance. Probable history of bipolar disorder. Agitation improved with low-dose  risperidone. Reports that she still has some agitation at home and will titrate her risperidone to 0.5 mg daily at bedtime Overall, she seems fairly stable today.   We'll plan routine follow-up in 3 months   #2 hypertension. Slightly elevated today. This has been monitored and stable at home. Continue amlodipine.   #3 history of atrial fibrillation. Continue Xarelto.

## 2015-05-11 ENCOUNTER — Telehealth: Payer: Self-pay | Admitting: Family Medicine

## 2015-05-11 NOTE — Telephone Encounter (Signed)
She should be on Namenda 10 mg po bid Take "Namenda titration pack"  Off med list.  Thx!

## 2015-05-11 NOTE — Telephone Encounter (Signed)
Pt need new Rx for Namenda 10 mg tablets.    Pharm:  [facility]

## 2015-05-11 NOTE — Telephone Encounter (Signed)
Dr. [name], please clarify dosage on Namenda please? Pt requesting refill.

## 2015-05-12 MED ORDER — MEMANTINE HCL 10 MG PO TABS: 10.0000 mg | ORAL_TABLET | Freq: Two times a day (BID) | ORAL | Status: DC

## 2015-05-12 NOTE — Telephone Encounter (Signed)
Rx sent to pharmacy   

## 2015-05-15 ENCOUNTER — Other Ambulatory Visit: Payer: Self-pay | Admitting: Family Medicine

## 2015-05-17 ENCOUNTER — Other Ambulatory Visit: Payer: Self-pay | Admitting: *Deleted

## 2015-05-17 MED ORDER — MESALAMINE 1.2 G PO TBEC: 3.6000 g | DELAYED_RELEASE_TABLET | Freq: Every day | ORAL | Status: DC

## 2015-05-19 ENCOUNTER — Telehealth: Payer: Self-pay
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
## 2015-05-22 NOTE — Telephone Encounter (Signed)
Please review. Okay to give daughter information?

## 2015-05-22 NOTE — Telephone Encounter (Signed)
If she is on the DPR I don't see any problem with them getting the records.

## 2015-05-22 NOTE — Telephone Encounter (Signed)
Patient's daughter, [name], is on the [place]. The daughter will have to contact Medical Records to receive any documentation from patient's medical record. In addition, Dr. [name] must also be aware of family's request. Below is Medical Record's information:  Phone # [phone]    Address: [facility]  [address]  [place], [place] [address]

## 2015-05-23 NOTE — Telephone Encounter (Signed)
Spoke with [name] and she is aware of information below.

## 2015-05-23 NOTE — Telephone Encounter (Signed)
LT message with Selinda Eon to call back.

## 2015-05-29 ENCOUNTER — Other Ambulatory Visit: Payer: Self-pay

## 2015-05-29 DIAGNOSIS — I4891 Unspecified atrial fibrillation: Secondary | ICD-10-CM

## 2015-05-29 MED ORDER — RIVAROXABAN 20 MG PO TABS: 20.0000 mg | ORAL_TABLET | Freq: Every day | ORAL | Status: DC

## 2015-06-02 DIAGNOSIS — F0633 Mood disorder due to known physiological condition with manic features: Secondary | ICD-10-CM | POA: Diagnosis not present

## 2015-06-21 ENCOUNTER — Telehealth: Payer: Self-pay | Admitting: Family Medicine

## 2015-06-21 NOTE — Telephone Encounter (Signed)
Patient scheduled with Gregary Signs tomorrow. Will route to [place] as FYI.

## 2015-06-21 NOTE — Telephone Encounter (Signed)
Name: [name]  DOB: 1932-03-13    Initial Comment Caller states his wife is having really spasm in her left calf muscle and she can barely walk.    Nurse Assessment  Nurse: Mallie Mussel, RN, Alveta Heimlich Date/Time [name] Time): 06/21/2015 1:11:13 PM  Confirm and document reason for call. If symptomatic, describe symptoms. You must click the next button to save text entered. ---Patient states that she has bad spasms in her left calf area which began this morning. She can hardly walk on it. She normally walks a lot. Walks for exercise. She rates her pain as 5-6 on 0-10 scale. She denies any history of blood clots in her legs and lungs. She denies injury to her leg. She just woke up this morning with pain behind her knee, just below the knee. The pain is more so in the knee than the calf.  Has the patient traveled out of the country within the last 30 days? ---No  Does the patient have any new or worsening symptoms? ---Yes  Will a triage be completed? ---Yes  Related visit to physician within the last 2 weeks? ---No  Does the PT have any chronic conditions? (i.e. diabetes, asthma, etc.) ---Yes  List chronic conditions. ---HTN  Is this a behavioral health or substance abuse call? ---No     Guidelines    Guideline Title Affirmed Question Affirmed Notes  Knee Pain [1] Swollen joint AND [2] no fever or redness    Final Disposition User   See PCP When Office is Open (within 3 days) Mallie Mussel, RN, Alveta Heimlich    Comments  Dr. [name] does not have appointments tomorrow. I scheduled her for 8:30am with Delano Metz NP for tomorrow.   Referrals  REFERRED TO PCP OFFICE   Disagree/Comply: Comply

## 2015-06-22 ENCOUNTER — Ambulatory Visit: Payer: Medicare Other | Admitting: Family Medicine

## 2015-06-22 ENCOUNTER — Ambulatory Visit (INDEPENDENT_AMBULATORY_CARE_PROVIDER_SITE_OTHER): Payer: Medicare Other | Admitting: Family Medicine

## 2015-06-22 ENCOUNTER — Encounter: Payer: Self-pay | Admitting: Family Medicine

## 2015-06-22 VITALS — BP 150/70 | HR 80 | Temp 98.1°F | Ht 64.0 in

## 2015-06-22 DIAGNOSIS — M25562 Pain in left knee: Secondary | ICD-10-CM | POA: Diagnosis not present

## 2015-06-22 NOTE — Progress Notes (Signed)
Pre visit review using our clinic review tool, if applicable. No additional management support is needed unless otherwise documented below in the visit note. Patient unable to stand for weight measurement today.

## 2015-06-22 NOTE — Patient Instructions (Signed)
Heat for 15 minutes twice daily.  Tylenol 500 mg up to 3 times daily if helps with pain as needed.  Massage in a topical sports cream such containing capsaicin or menthol - these are available over the counter.  Follow up if worsening, new concerns or not improving over the next week.

## 2015-06-22 NOTE — Progress Notes (Signed)
HPI:  Acute visit for calf pain. Extensive PMH. Pain started when stepped out of bed yesterday. Pain in specific point in Left lateral calf muscle. No redness, swelling, numbness, radiation, weakness, fevers, malaise. 0/10 pain level unless walking or pressing on this, then 4-5/10. She has duragesic cream and tramadol at home but has not taken these. She also reports she can take tylenol for pain but has not tried this either.  ROS: See pertinent positives and negatives per HPI.  Past Medical History  Diagnosis Date  . OSA (obstructive sleep apnea)   . History of bronchitis   . Other diseases of lung, not elsewhere classified   . Hypertension   . CAD (coronary artery disease)     LAD 30% 2006  . History of atrial flutter   . Cerebrovascular disease   . Hyperlipemia   . Obesity   . GERD (gastroesophageal reflux disease)   . Diverticulosis of colon   . IBS (irritable bowel syndrome)   . Colonic polyp   . Crohn's disease ([place])   . Hypothyroidism   . History of hyperparathyroidism   . Fibromyalgia   . Osteopenia   . History of headache   . Syncope   . Restless legs syndrome (RLS)   . Psychosis   . History of tuberculosis   . Radiation 05/2008    5000 cGy to left lower eyelid, parotid lymphatics and upper neck  . Memory disturbance   . Bipolar affective ([place])     History of psychosis  . 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 C. difficile diarrhea     Past Surgical History  Procedure Laterality Date  . Parathyroid adenoma surgery    . Cholecystectomy    . Moh';s surg left lower eye lid surgery and lid reconstruction surgery  2/10  . Bladder suspension    . Cataract extraction Bilateral   . Appendectomy    . Tonsillectomy and adenoidectomy    . Lumbar laminectomy    . Incision and drainage perirectal abscess    . Heel spur excision    . Nasal sinus surgery Right     Right maxillary  . Eye lid surgery  04/04/2011    [facility] by Dr. Norlene Duel  . Posterior cervical  fusion/foraminotomy N/A 10/12/2013    Procedure: Cervical five-Thoracic two cervico-thoracic posterior fusion;  Surgeon: [name], MD;  Location: [facility];  Service: Neurosurgery;  Laterality: N/A;    Family History  Problem Relation Age of Onset  . Colon cancer Maternal Grandmother   . Arthritis Mother   . Hypertension Mother   . Ulcers Mother   . Heart failure Father   . Pulmonary embolism      Social History   Social History  . Marital Status: Married    Spouse Name: Barnabas Lister  . Number of Children: 2  . Years of Education: N/A   Occupational History  .     Social History Main Topics  . Smoking status: Former Smoker -- 6 years    Types: Cigarettes    Quit date: 01/08/1960  . Smokeless tobacco: Never Used  . Alcohol Use: No  . Drug Use: No  . Sexual Activity: Not Asked   Other Topics Concern  . None   Social History Narrative   Patient is married with 2 children   Patient is right handed   Patient is a retired Marine scientist   Patient does not drink any caffeine.  Current outpatient prescriptions:  .  amLODipine (NORVASC) 5 MG tablet, Take 1 tablet (5 mg total) by mouth daily., Disp: 90 tablet, Rfl: 3 .  atorvastatin (LIPITOR) 40 MG tablet, Take 1 tablet (40 mg total) by mouth daily., Disp: 30 tablet, Rfl: 11 .  Calcium-Magnesium 500-250 MG TABS, Take 2 tablets by mouth daily at 6 PM., Disp: 60 each, Rfl: 11 .  Cholecalciferol (VITAMIN D3) 2000 units TABS, Take 2,000 Units by mouth daily at 12 noon., Disp: 30 tablet, Rfl: 11 .  dicyclomine (BENTYL) 10 MG capsule, Take 1 capsule (10 mg total) by mouth 3 (three) times daily before meals., Disp: 20 capsule, Rfl: 0 .  donepezil (ARICEPT) 5 MG tablet, TK 1 T PO HS, Disp: , Rfl: 5 .  fluticasone (FLONASE) 50 MCG/ACT nasal spray, Place 2 sprays into both nostrils at bedtime., Disp: 16 g, Rfl: 2 .  levothyroxine (SYNTHROID, LEVOTHROID) 88 MCG tablet, Take 1 tablet (88 mcg total) by mouth daily before breakfast., Disp: 30  tablet, Rfl: 11 .  Melatonin 10 MG TABS, Take 10 mg by mouth 1 day or 1 dose. (Patient taking differently: Take 10 mg by mouth at bedtime. ), Disp: 30 tablet, Rfl: 5 .  memantine (NAMENDA) 10 MG tablet, Take 1 tablet (10 mg total) by mouth 2 (two) times daily., Disp: 180 tablet, Rfl: 1 .  mesalamine (LIALDA) 1.2 g EC tablet, Take 3 tablets (3.6 g total) by mouth daily with lunch., Disp: 90 tablet, Rfl: 5 .  pramipexole (MIRAPEX) 0.5 MG tablet, Take 1 tablet (0.5 mg total) by mouth 2 (two) times daily., Disp: 60 tablet, Rfl: 11 .  Propylene Glycol (SYSTANE BALANCE) 0.6 % SOLN, Place 1 drop into both eyes daily., Disp: , Rfl:  .  risperiDONE (RISPERDAL) 0.5 MG tablet, Take 1 tablet (0.5 mg total) by mouth at bedtime., Disp: 90 tablet, Rfl: 3 .  rivaroxaban (XARELTO) 20 MG TABS tablet, Take 1 tablet (20 mg total) by mouth daily with supper., Disp: 90 tablet, Rfl: 2 .  traMADol (ULTRAM) 50 MG tablet, Take 50 mg by mouth every 6 (six) hours as needed for moderate pain or severe pain., Disp: , Rfl:   EXAM:  Filed Vitals:   06/22/15 1702  BP: 150/70  Pulse: 80  Temp: 98.1 F (36.7 C)    There is no weight on file to calculate BMI.  GENERAL: vitals reviewed and listed above, alert, oriented, appears well hydrated and in no acute distress  HEENT: atraumatic, conjunttiva clear, no obvious abnormalities on inspection of external nose and ears  NECK: no obvious masses on inspection  LUNGS: clear to auscultation bilaterally, no wheezes, rales or rhonchi, good air movement  CV: HRRR, no peripheral edema  MS:  when asked to ambulate in the room, she does have a slow and careful gait ( reports she usually walks with a walker); she has some tenderness in a specific point in theleft lateral calf muscle; she does not have any swelling, redness, warmth, overlying skin changes or tenderness on palpation of the pains, seems to be neurovascularly intact distally  PSYCH: pleasant and cooperative, no  obvious depression or anxiety  ASSESSMENT AND PLAN:  Discussed the following assessment and plan:  Arthralgia of left lower leg  -I think this is probably a muscle strain -opted to try conservative/symptomatic care per instructions -discussed other potential etiologies of leg pain and advised to return or notify a doctor immediately if symptoms worsen or persist or new concerns arise.  Patient  Instructions  Heat for 15 minutes twice daily.  Tylenol 500 mg up to 3 times daily if helps with pain as needed.  Massage in a topical sports cream such containing capsaicin or menthol - these are available over the counter.  Follow up if worsening, new concerns or not improving over the next week.     Colin Benton R.

## 2015-06-23 ENCOUNTER — Ambulatory Visit: Payer: Self-pay | Admitting: Family Medicine

## 2015-06-23 ENCOUNTER — Telehealth: Payer: Self-pay | Admitting: Family Medicine

## 2015-06-23 DIAGNOSIS — Z0289 Encounter for other administrative examinations: Secondary | ICD-10-CM

## 2015-06-23 NOTE — Telephone Encounter (Signed)
Pt no show'd appt

## 2015-06-23 NOTE — Telephone Encounter (Signed)
Patient Name: [name] DOB: 1932/05/09 Initial Comment Caller states wife c/o knee pain. He would like to know if a brace would help. Nurse Assessment Nurse: Vallery Sa, RN, [place] Date/Time (Eastern Time): 06/23/2015 2:26:46 PM Confirm and document reason for call. If symptomatic, describe symptoms. You must click the next button to save text entered. ---[name] states she developed left knee pain yesterday that is worse today (rated as a 5/6 on the 1 to 10 scale). No injury in the past 3 days. No fever. Has the patient traveled out of the country within the last 30 days? ---No Does the patient have any new or worsening symptoms? ---Yes Will a triage be completed? ---Yes Related visit to physician within the last 2 weeks? ---Yes Does the PT have any chronic conditions? (i.e. diabetes, asthma, etc.) ---Yes List chronic conditions. ---High Blood Pressure Is this a behavioral health or substance abuse call? ---No Guidelines Guideline Title Affirmed Question Affirmed Notes Knee Pain [1] MODERATE pain (e.g., interferes with normal activities, limping) AND [2] present > 3 days Final Disposition User See PCP When Office is Open (within 3 days) Vallery Sa, RN, [employer] Referrals REFERRED TO PCP OFFICE Disagree/Comply: Comply Scheduled for 4:30pm appointment today with Dr. [name]. Crystal states they will call back to cancel appointment if they can't arrange transportation.

## 2015-08-10 DIAGNOSIS — F0633 Mood disorder due to known physiological condition with manic features: Secondary | ICD-10-CM | POA: Diagnosis not present

## 2015-08-18 DIAGNOSIS — Z6833 Body mass index (BMI) 33.0-33.9, adult: Secondary | ICD-10-CM | POA: Diagnosis not present

## 2015-08-18 DIAGNOSIS — N958 Other specified menopausal and perimenopausal disorders: Secondary | ICD-10-CM | POA: Diagnosis not present

## 2015-08-18 DIAGNOSIS — Z01419 Encounter for gynecological examination (general) (routine) without abnormal findings: Secondary | ICD-10-CM | POA: Diagnosis not present

## 2015-08-18 DIAGNOSIS — Z1231 Encounter for screening mammogram for malignant neoplasm of breast: Secondary | ICD-10-CM | POA: Diagnosis not present

## 2015-08-18 DIAGNOSIS — M8588 Other specified disorders of bone density and structure, other site: Secondary | ICD-10-CM | POA: Diagnosis not present

## 2015-08-22 ENCOUNTER — Other Ambulatory Visit: Payer: Self-pay | Admitting: Obstetrics and Gynecology

## 2015-08-22 DIAGNOSIS — R928 Other abnormal and inconclusive findings on diagnostic imaging of breast: Secondary | ICD-10-CM

## 2015-08-24 ENCOUNTER — Other Ambulatory Visit: Payer: Self-pay | Admitting: Emergency Medicine

## 2015-08-24 MED ORDER — ATORVASTATIN CALCIUM 40 MG PO TABS: 40.0000 mg | ORAL_TABLET | Freq: Every day | ORAL | 1 refills | Status: DC

## 2015-08-29 ENCOUNTER — Other Ambulatory Visit: Payer: Self-pay | Admitting: Family Medicine

## 2015-08-29 DIAGNOSIS — M8580 Other specified disorders of bone density and structure, unspecified site: Secondary | ICD-10-CM | POA: Diagnosis not present

## 2015-09-01 ENCOUNTER — Other Ambulatory Visit: Payer: Self-pay | Admitting: Family Medicine

## 2015-09-12 DIAGNOSIS — M899 Disorder of bone, unspecified: Secondary | ICD-10-CM | POA: Diagnosis not present

## 2015-09-14 ENCOUNTER — Other Ambulatory Visit: Payer: Medicare Other

## 2015-09-19 ENCOUNTER — Other Ambulatory Visit: Payer: Medicare Other

## 2015-10-06 ENCOUNTER — Other Ambulatory Visit: Payer: Medicare Other

## 2015-10-23 ENCOUNTER — Ambulatory Visit
Admission: RE | Admit: 2015-10-23 | Discharge: 2015-10-23 | Disposition: A | Discharge status: Home / Self Care | Payer: Medicare Other | Source: Ambulatory Visit | Attending: Obstetrics and Gynecology | Admitting: Obstetrics and Gynecology

## 2015-10-23 ENCOUNTER — Other Ambulatory Visit: Payer: Self-pay | Admitting: Obstetrics and Gynecology

## 2015-10-23 DIAGNOSIS — N6012 Diffuse cystic mastopathy of left breast: Secondary | ICD-10-CM | POA: Diagnosis not present

## 2015-10-23 DIAGNOSIS — R928 Other abnormal and inconclusive findings on diagnostic imaging of breast: Secondary | ICD-10-CM

## 2015-10-23 DIAGNOSIS — N6320 Unspecified lump in the left breast, unspecified quadrant: Secondary | ICD-10-CM

## 2015-10-23 DIAGNOSIS — N6323 Unspecified lump in the left breast, lower outer quadrant: Secondary | ICD-10-CM | POA: Diagnosis not present

## 2015-10-24 ENCOUNTER — Other Ambulatory Visit: Payer: Self-pay

## 2015-10-25 ENCOUNTER — Other Ambulatory Visit: Payer: Self-pay | Admitting: Obstetrics and Gynecology

## 2015-10-25 DIAGNOSIS — N6320 Unspecified lump in the left breast, unspecified quadrant: Secondary | ICD-10-CM

## 2015-10-25 DIAGNOSIS — R928 Other abnormal and inconclusive findings on diagnostic imaging of breast: Secondary | ICD-10-CM

## 2015-10-26 ENCOUNTER — Other Ambulatory Visit: Payer: Self-pay | Admitting: Obstetrics and Gynecology

## 2015-10-26 ENCOUNTER — Inpatient Hospital Stay: Admission: RE | Admit: 2015-10-26 | Payer: Medicare Other | Source: Ambulatory Visit

## 2015-10-26 ENCOUNTER — Other Ambulatory Visit: Payer: Medicare Other

## 2015-10-26 DIAGNOSIS — N6002 Solitary cyst of left breast: Secondary | ICD-10-CM

## 2015-10-27 ENCOUNTER — Other Ambulatory Visit: Payer: Self-pay | Admitting: Family Medicine

## 2015-10-31 ENCOUNTER — Ambulatory Visit
Admission: RE | Admit: 2015-10-31 | Discharge: 2015-10-31 | Disposition: A | Discharge status: Home / Self Care | Payer: Medicare Other | Source: Ambulatory Visit | Attending: Obstetrics and Gynecology | Admitting: Obstetrics and Gynecology

## 2015-10-31 DIAGNOSIS — C50512 Malignant neoplasm of lower-outer quadrant of left female breast: Secondary | ICD-10-CM | POA: Diagnosis not present

## 2015-10-31 DIAGNOSIS — N6002 Solitary cyst of left breast: Secondary | ICD-10-CM

## 2015-10-31 DIAGNOSIS — N6012 Diffuse cystic mastopathy of left breast: Secondary | ICD-10-CM | POA: Diagnosis not present

## 2015-10-31 DIAGNOSIS — N6320 Unspecified lump in the left breast, unspecified quadrant: Secondary | ICD-10-CM

## 2015-10-31 DIAGNOSIS — R928 Other abnormal and inconclusive findings on diagnostic imaging of breast: Secondary | ICD-10-CM

## 2015-10-31 DIAGNOSIS — N6323 Unspecified lump in the left breast, lower outer quadrant: Secondary | ICD-10-CM | POA: Diagnosis not present

## 2015-11-02 DIAGNOSIS — Z23 Encounter for immunization: Secondary | ICD-10-CM | POA: Diagnosis not present

## 2015-11-06 DIAGNOSIS — C50512 Malignant neoplasm of lower-outer quadrant of left female breast: Secondary | ICD-10-CM | POA: Diagnosis not present

## 2015-11-06 DIAGNOSIS — Z170 Estrogen receptor positive status [ER+]: Secondary | ICD-10-CM | POA: Diagnosis not present

## 2015-11-07 ENCOUNTER — Encounter: Payer: Self-pay | Admitting: Genetic Counselor

## 2015-11-08 ENCOUNTER — Telehealth: Payer: Self-pay | Admitting: *Deleted

## 2015-11-08 DIAGNOSIS — F0633 Mood disorder due to known physiological condition with manic features: Secondary | ICD-10-CM | POA: Diagnosis not present

## 2015-11-08 NOTE — Telephone Encounter (Signed)
Requesting surgical clearance:   1. Type of surgery: Lumpectomy  2. Surgeon: Autumn Messing  3. Surgical date: Pending  4. Medications that need to be help: Xarelto  5. [facility]: (P) [phone] (F) [phone]   Pt saw you on 04/03/2015, Is pt cleared for surgery and how long can Xarelto be held?

## 2015-11-10 ENCOUNTER — Telehealth: Payer: Self-pay | Admitting: Hematology

## 2015-11-10 ENCOUNTER — Encounter: Payer: Self-pay | Admitting: Hematology

## 2015-11-10 NOTE — Telephone Encounter (Signed)
OK to hold the Xarelto x 2 days.  She is at acceptable risk for surgery.

## 2015-11-10 NOTE — Telephone Encounter (Signed)
Appt scheduled w/Feng on 11/13 at 11am. Cld the pt, but there was no ans and couldn't llv a vm. Will mail a letter with the appt date and time. Will also attempt to contact the pt on Monday.

## 2015-11-13 NOTE — Telephone Encounter (Signed)
Clearance faxed via Epic to [facility] and Dr Marlou Starks

## 2015-11-17 ENCOUNTER — Telehealth: Payer: Self-pay | Admitting: Family Medicine

## 2015-11-17 MED ORDER — LEVOTHYROXINE SODIUM 88 MCG PO TABS: 88.0000 ug | ORAL_TABLET | Freq: Every day | ORAL | 11 refills | Status: DC

## 2015-11-17 NOTE — Telephone Encounter (Signed)
Pt needs levothyroxine 88 mcg #30  sent to [facility] [phone] fax # [phone]

## 2015-11-17 NOTE — Progress Notes (Signed)
Location of Breast Cancer: Left Breast  Histology per Pathology Report:  10/31/15 Diagnosis 1. Breast, left, needle core biopsy, 4:30, mass - INVASIVE DUCTAL CARCINOMA. - DUCTAL CARCINOMA IN SITU. - SEE COMMENT. 2. Breast, left, needle core biopsy, 4 o'clock, cystic mass - FIBROCYSTIC CHANGES. - THERE IS NO EVIDENCE OF MALIGNANCY.  Receptor Status: ER(100%), PR (90%), Her2-neu (NEG), Ki-(15%)  Did patient present with symptoms or was this found on screening mammography?: It was found on a screening mammogram.   Past/Anticipated interventions by surgeon, if any: Dr. Marlou Starks Lumpectomy 12/08/15  Past/Anticipated interventions by medical oncology, if any:  Dr. Burr Medico 11/20/15 Plan -she will proceed with left breast lumpectomy by Dr. Marlou Starks soon -I plan to see her back 3-4 weeks after her surgery, to finalize her adjuvant endocrine therapy. -will discuss her case in breast tumor board after her surgery, to see if she can skip adjuvant radiation   Lymphedema issues, if any:  No  Pain issues, if any:  No  SAFETY ISSUES:  Prior radiation? ? Radiation 2010 5000 cGy to left lower eyelid, parotid lymphatics and upper neck.   Pacemaker/ICD? No  Possible current pregnancy? No  Is the patient on methotrexate? No  Current Complaints / other details:     BP (!) 159/73   Pulse (!) 102   Temp 97.7 F (36.5 C)   Ht 5' 4"  (1.626 m)   Wt 178 lb 9.6 oz (81 kg)   SpO2 96% Comment: room air  BMI 30.66 kg/m    Wt Readings from Last 3 Encounters:  11/24/15 178 lb 9.6 oz (81 kg)  11/21/15 179 lb 14.4 oz (81.6 kg)  04/27/15 168 lb 9.6 oz (76.5 kg)   Bethanie Bloxom, Stephani Police, RN 11/17/2015,10:49 AM

## 2015-11-17 NOTE — Telephone Encounter (Signed)
Pt need refill on levothyroxine 88 mcg #90 w/[facility]

## 2015-11-17 NOTE — Telephone Encounter (Signed)
Medication sent in for patient. 

## 2015-11-19 DIAGNOSIS — C50512 Malignant neoplasm of lower-outer quadrant of left female breast: Secondary | ICD-10-CM | POA: Insufficient documentation

## 2015-11-19 NOTE — Progress Notes (Deleted)
[place]  [phone] Fax:([phone] [place] Note   Patient Care Team: [name], MD as PCP - General (Family Medicine) 11/19/2015  CHIEF COMPLAINTS/PURPOSE OF CONSULTATION:  ***   Oncology History   Breast cancer of lower-outer quadrant of left female breast [facility])   Staging form: Breast, AJCC 7th Edition   - Clinical stage from 10/31/2015: Stage IA (T1c, N0, M0) - Signed by [name], MD on 11/19/2015      Breast cancer of lower-outer quadrant of left female breast ([place])   10/23/2015 Mammogram    Diagnostic mammo and [place] shpwed left breast 4:00 complex cystic mass, 4:30 area of shadowing measuring 1.0X1.1X1.2cm      10/31/2015 Initial Diagnosis    Breast cancer of lower-outer quadrant of left female breast ([place])      10/31/2015 Initial Biopsy    Left breast core needle biopsy at 4:30 showed invasive ductal carcinoma and DCIS, , G1-2, 4:00 position biopsy was negative for malignant cells       10/31/2015 Receptors her2    ER 100%+, PR 90%+, HER2-, Ki67 15%       HISTORY OF PRESENTING ILLNESS:  [name] 82 y.o. female is here because of ***  MEDICAL HISTORY:  Past Medical History:  Diagnosis Date  . Bipolar affective ([place])    History of psychosis  . C. difficile diarrhea   . CAD (coronary artery disease)    LAD 30% 2006  . Cerebrovascular disease   . Colonic polyp   . Crohn's disease ([place])   . Diverticulosis of colon   . Fibromyalgia   . GERD (gastroesophageal reflux disease)   . History of atrial flutter   . History of bronchitis   . History of headache   . History of hyperparathyroidism   . History of tuberculosis   . Hyperlipemia   . Hypertension   . Hypothyroidism   . IBS (irritable bowel syndrome)   . Memory disturbance   . 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 Obesity   . OSA (obstructive sleep apnea)   . Osteopenia   . Other diseases of lung, not elsewhere classified   .  Psychosis   . Radiation 05/2008   5000 cGy to left lower eyelid, parotid lymphatics and upper neck  . Restless legs syndrome (RLS)   . Syncope     SURGICAL HISTORY: Past Surgical History:  Procedure Laterality Date  . APPENDECTOMY    . BLADDER SUSPENSION    . CATARACT EXTRACTION Bilateral   . CHOLECYSTECTOMY    . eye lid surgery  04/04/2011   [facility] by Dr. Norlene Duel  . HEEL SPUR EXCISION    . INCISION AND DRAINAGE PERIRECTAL ABSCESS    . LUMBAR LAMINECTOMY    . Moh';s surg left lower eye lid surgery and lid reconstruction surgery  2/10  . NASAL SINUS SURGERY Right    Right maxillary  . Parathyroid adenoma surgery    . POSTERIOR CERVICAL FUSION/FORAMINOTOMY N/A 10/12/2013   Procedure: Cervical five-Thoracic two cervico-thoracic posterior fusion;  Surgeon: [name], MD;  Location: [facility];  Service: Neurosurgery;  Laterality: N/A;  . TONSILLECTOMY AND ADENOIDECTOMY      SOCIAL HISTORY: Social History   Social History  . Marital status: Married    Spouse name: Barnabas Lister  . Number of children: 2  . Years of education: N/A   Occupational History  .  Retired   Social History  Main Topics  . Smoking status: Former Smoker    Years: 6.00    Types: Cigarettes    Quit date: 01/08/1960  . Smokeless tobacco: Never Used  . Alcohol use No  . Drug use: No  . Sexual activity: Not on file   Other Topics Concern  . Not on file   Social History Narrative   Patient is married with 2 children   Patient is right handed   Patient is a retired Marine scientist   Patient does not drink any caffeine.    FAMILY HISTORY: Family History  Problem Relation Age of Onset  . Heart failure Father   . Arthritis Mother   . Hypertension Mother   . Ulcers Mother   . Colon cancer Maternal Grandmother   . Pulmonary embolism      ALLERGIES:  is allergic to prednisone; carbamazepine; celebrex [celecoxib]; codeine; demerol [meperidine]; diclofenac sodium; fentanyl; fluoxetine hcl; hydromorphone hcl;  ibuprofen; indomethacin; lisinopril; meperidine hcl; morphine; oruvail [ketoprofen]; pregabalin; propranolol hcl; refresh lacri-lube [artificial tears]; relafen [nabumetone]; ropinirole hcl; sulfamethoxazole; and bacitracin.  MEDICATIONS:  Current Outpatient Prescriptions  Medication Sig Dispense Refill  . amLODipine (NORVASC) 5 MG tablet Take 1 tablet (5 mg total) by mouth daily. 90 tablet 3  . atorvastatin (LIPITOR) 40 MG tablet Take 1 tablet (40 mg total) by mouth daily. 90 tablet 1  . Calcium-Magnesium 500-250 MG TABS Take 2 tablets by mouth daily at 6 PM. 60 each 11  . Cholecalciferol (VITAMIN D3) 2000 units TABS Take 2,000 Units by mouth daily at 12 noon. 30 tablet 11  . dicyclomine (BENTYL) 10 MG capsule Take 1 capsule (10 mg total) by mouth 3 (three) times daily before meals. 20 capsule 0  . donepezil (ARICEPT) 5 MG tablet TK 1 T PO HS  5  . fluticasone (FLONASE) 50 MCG/ACT nasal spray Place 2 sprays into both nostrils at bedtime. 16 g 2  . levothyroxine (SYNTHROID, LEVOTHROID) 88 MCG tablet Take 1 tablet (88 mcg total) by mouth daily before breakfast. 30 tablet 11  . Melatonin 10 MG TABS Take 10 mg by mouth 1 day or 1 dose. (Patient taking differently: Take 10 mg by mouth at bedtime. ) 30 tablet 5  . memantine (NAMENDA) 10 MG tablet TAKE 1 TABLET (10 MG TOTAL) BY MOUTH TWO (TWO) TIMES DAILY. 180 tablet 2  . mesalamine (LIALDA) 1.2 g EC tablet Take 3 tablets (3.6 g total) by mouth daily with lunch. 90 tablet 5  . pramipexole (MIRAPEX) 0.5 MG tablet TAKE 1 TABLET TWICE A DAY (NEED OFFICE VISIT) 180 tablet 0  . Propylene Glycol (SYSTANE BALANCE) 0.6 % SOLN Place 1 drop into both eyes daily.    . risperiDONE (RISPERDAL) 0.5 MG tablet Take 1 tablet (0.5 mg total) by mouth at bedtime. 90 tablet 3  . rivaroxaban (XARELTO) 20 MG TABS tablet Take 1 tablet (20 mg total) by mouth daily with supper. 90 tablet 2  . traMADol (ULTRAM) 50 MG tablet Take 50 mg by mouth every 6 (six) hours as needed for  moderate pain or severe pain.     No current facility-administered medications for this visit.     REVIEW OF SYSTEMS:   Constitutional: Denies fevers, chills or abnormal night sweats Eyes: Denies blurriness of vision, double vision or watery eyes Ears, nose, mouth, throat, and face: Denies mucositis or sore throat Respiratory: Denies cough, dyspnea or wheezes Cardiovascular: Denies palpitation, chest discomfort or lower extremity swelling Gastrointestinal:  Denies nausea, heartburn or change in bowel  habits Skin: Denies abnormal skin rashes Lymphatics: Denies new lymphadenopathy or easy bruising Neurological:Denies numbness, tingling or new weaknesses Behavioral/Psych: Mood is stable, no new changes  All other systems were reviewed with the patient and are negative.  PHYSICAL EXAMINATION: ECOG PERFORMANCE STATUS: {CHL ONC ECOG PS:[id number]}  There were no vitals filed for this visit. There were no vitals filed for this visit.  GENERAL:alert, no distress and comfortable SKIN: skin color, texture, turgor are normal, no rashes or significant lesions EYES: normal, conjunctiva are pink and non-injected, sclera clear OROPHARYNX:no exudate, no erythema and lips, buccal mucosa, and tongue normal  NECK: supple, thyroid normal size, non-tender, without nodularity LYMPH:  no palpable lymphadenopathy in the cervical, axillary or inguinal LUNGS: clear to auscultation and percussion with normal breathing effort HEART: regular rate & rhythm and no murmurs and no lower extremity edema ABDOMEN:abdomen soft, non-tender and normal bowel sounds Musculoskeletal:no cyanosis of digits and no clubbing  PSYCH: alert & oriented x 3 with fluent speech NEURO: no focal motor/sensory deficits  LABORATORY DATA:  I have reviewed the data as listed CBC Latest Ref Rng & Units 04/11/2015 03/12/2015 02/17/2015  WBC 4.0 - 10.5 K/uL 8.2 4.8 5.4  Hemoglobin 12.0 - 15.0 g/dL 13.9 13.6 12.0  Hematocrit 36.0 - 46.0 %  40.7 42.8 36.6  Platelets 150 - 400 K/uL 213 191 143(L)   CMP Latest Ref Rng & Units 04/11/2015 03/12/2015 02/17/2015  Glucose 65 - 99 mg/dL 119(H) 113(H) 89  BUN 6 - 20 mg/dL 25(H) 18 16  Creatinine 0.44 - 1.00 mg/dL 0.79 0.80 0.58  Sodium 135 - 145 mmol/L 140 141 141  Potassium 3.5 - 5.1 mmol/L 4.2 3.9 3.9  Chloride 101 - 111 mmol/L 106 105 107  CO2 22 - 32 mmol/L _0 Calcium 8.9 - 10.3 mg/dL 9.6 9.5 8.9  Total Protein 6.5 - 8.1 g/dL 8.0 7.2 5.8(L)  Total Bilirubin 0.3 - 1.2 mg/dL 0.7 0.7 0.9  Alkaline Phos 38 - 126 U/L 68 67 57  AST 15 - 41 U/L 33 26 18  ALT 14 - 54 U/L _1 Pathology report  Diagnosis 10/31/2015 1. Breast, left, needle core biopsy, 4:30, mass - INVASIVE DUCTAL CARCINOMA. - DUCTAL CARCINOMA IN SITU. - SEE COMMENT. 2. Breast, left, needle core biopsy, 4 o'clock, cystic mass - FIBROCYSTIC CHANGES. - THERE IS NO EVIDENCE OF MALIGNANCY. Microscopic Comment 1. The carcinoma appears grade 1-2. A breast prognostic profile will be performed and the results reported separately. The results were called to [facility] on 11/01/15. (JBK:gt, 11/01/15) Results: IMMUNOHISTOCHEMICAL AND MORPHOMETRIC ANALYSIS PERFORMED MANUALLY Estrogen Receptor: 100%, POSITIVE, STRONG STAINING INTENSITY Progesterone Receptor: 90%, POSITIVE, STRONG STAINING INTENSITY Proliferation Marker Ki67: 15%  Results: HER2 - NEGATIVE RATIO OF HER2/CEP17 SIGNALS 1.37 AVERAGE HER2 COPY NUMBER PER CELL 2.80  RADIOGRAPHIC STUDIES: I have personally reviewed the radiological images as listed and agreed with the findings in the report. US Breast Ltd Uni Left Inc Axilla  Result Date: 10/23/2015 CLINICAL DATA:  Left breast upper outer quadrant focal asymmetry seen on most recent screening mammography. EXAM: 2D DIGITAL DIAGNOSTIC LEFT MAMMOGRAM WITH CAD AND ADJUNCT TOMO ULTRASOUND LEFT BREAST COMPARISON:  Previous exam(s). ACR Breast Density Category c: The breast tissue is  heterogeneously dense, which may obscure small masses. FINDINGS: Additional mammographic views of the left breast demonstrate persistent circumscribed mass in the left breast slightly upper outer quadrant, anterior depth. Additionally, there is a lobulated mass in the left slightly  lower outer quadrant, posterior depth. Mammographic images were processed with CAD. On physical exam, there is thickening in the left breast lower outer quadrant, middle depth. Targeted ultrasound is performed, showing a benign-appearing cyst in the left 2 o'clock breast 1 cm from the nipple containing mobile debris. It measures 1.6 x 0.9 x 1.3 cm and likely corresponds to the mammographically seen oval mass in the left breast upper outer quadrant, anterior depth. Additionally, there is a complex cystic mass in the left breast 4 o'clock 4 cm from the nipple which measures 1.1 by 0.9 by 0.8 cm. In the left breast 4:30 o'clock 5 cm from nipple, there is an area of shadowing which measures 1.1 x 1.0 x 1.2 cm. There is no evidence of left axillary lymphadenopathy. IMPRESSION: Left breast 4 o'clock complex cystic mass. Left breast 4:30 o'clock area of shadowing. RECOMMENDATION: Ultrasound-guided core needle biopsy is recommended for both of these findings. I have discussed the findings and recommendations with the patient. Results were also provided in writing at the conclusion of the visit. If applicable, a reminder letter will be sent to the patient regarding the next appointment. BI-RADS CATEGORY  4: Suspicious. Electronically Signed   By: Fidela Salisbury M.D.   On: 10/23/2015 17:35   Mm Diag Breast Tomo Uni Left  Result Date: 10/23/2015 CLINICAL DATA:  Left breast upper outer quadrant focal asymmetry seen on most recent screening mammography. EXAM: 2D DIGITAL DIAGNOSTIC LEFT MAMMOGRAM WITH CAD AND ADJUNCT TOMO ULTRASOUND LEFT BREAST COMPARISON:  Previous exam(s). ACR Breast Density Category c: The breast tissue is heterogeneously  dense, which may obscure small masses. FINDINGS: Additional mammographic views of the left breast demonstrate persistent circumscribed mass in the left breast slightly upper outer quadrant, anterior depth. Additionally, there is a lobulated mass in the left slightly lower outer quadrant, posterior depth. Mammographic images were processed with CAD. On physical exam, there is thickening in the left breast lower outer quadrant, middle depth. Targeted ultrasound is performed, showing a benign-appearing cyst in the left 2 o'clock breast 1 cm from the nipple containing mobile debris. It measures 1.6 x 0.9 x 1.3 cm and likely corresponds to the mammographically seen oval mass in the left breast upper outer quadrant, anterior depth. Additionally, there is a complex cystic mass in the left breast 4 o'clock 4 cm from the nipple which measures 1.1 by 0.9 by 0.8 cm. In the left breast 4:30 o'clock 5 cm from nipple, there is an area of shadowing which measures 1.1 x 1.0 x 1.2 cm. There is no evidence of left axillary lymphadenopathy. IMPRESSION: Left breast 4 o'clock complex cystic mass. Left breast 4:30 o'clock area of shadowing. RECOMMENDATION: Ultrasound-guided core needle biopsy is recommended for both of these findings. I have discussed the findings and recommendations with the patient. Results were also provided in writing at the conclusion of the visit. If applicable, a reminder letter will be sent to the patient regarding the next appointment. BI-RADS CATEGORY  4: Suspicious. Electronically Signed   By: Fidela Salisbury M.D.   On: 10/23/2015 17:35   Mm Clip Placement Left  Addendum Date: 11/03/2015   ADDENDUM REPORT: 11/03/2015 08:04 ADDENDUM: An addendum is made to the findings portion of the exam due to a typographical error. This should read as: FINDINGS: Mammographic images were obtained following ultrasound guided biopsy of a mass in the left breast at the 4:30 position and ultrasound-guided biopsy of a  mass in the left breast at the 4 o'clock position . A  ribbon shaped biopsy marking clip is present at the site of the biopsied mass in the left breast at the 4:30 position. A coil shaped biopsy marking clip is present at the site of the biopsied mass in the left breast at the 4 o'clock position. Electronically Signed   By: Everlean Alstrom M.D.   On: 11/03/2015 08:04   Result Date: 11/03/2015 CLINICAL DATA:  Post ultrasound-guided biopsy of a mass in the left breast at the 4:30 position and ultrasound-guided biopsy of a cystic mass in the left breast at 4 o'clock position. EXAM: DIAGNOSTIC LEFT MAMMOGRAM POST ULTRASOUND BIOPSY COMPARISON:  Previous exam(s). FINDINGS: Mammographic images were obtained following ultrasound guided biopsy of a mass in the left breast at the 4:30 position and ultrasound-guided biopsy of a mass in the left breast at the 4 o'clock position. A ribbon shaped biopsy marking clip is present at the site of the biopsied mass in the left breast at the 4:30 position. Eighty coil shaped biopsy marking clip is present at the site of the biopsied mass in the left breast at the 4 o'clock position. IMPRESSION: 1. Appropriate ribbon shaped biopsy marking clip post ultrasound-guided biopsy of a mass in the left breast at the 4:30 position, site 1. 2. Appropriate coil shaped biopsy marking clip post ultrasound-guided biopsy of a mass in the left breast at the 4 o'clock position, site 2. Final Assessment: Post Procedure Mammograms for Marker Placement Electronically Signed: By: Everlean Alstrom M.D. On: 10/31/2015 14:20   [place] Lt Breast Bx W Loc Dev 1st Lesion Img Bx Spec US Guide  Addendum Date: 11/02/2015   ADDENDUM REPORT: 11/01/2015 14:11 ADDENDUM: Pathology revealed GRADE I-II INVASIVE DUCTAL CARCINOMA, DUCTAL CARCINOMA IN SITU of the Left breast at the 4:30 o'clock location. FIBROCYSTIC CHANGES of the Left breast at the 4:00 o'clock location. This was found to be concordant by Dr. Everlean Alstrom. Pathology results were discussed with the patient and her daughter, Katha Kuehne, by telephone. The patient reported doing well after the biopsies with tenderness at the sites. Post biopsy instructions and care were reviewed and questions were answered. The patient was encouraged to call [facility] for any additional concerns. Surgical consultation has been arranged with Dr. Autumn Messing at [facility] on November 06, 2015. Pathology results reported by Terie Purser, RN on 11/01/2015. Electronically Signed   By: Everlean Alstrom M.D.   On: 11/01/2015 14:11   Result Date: 11/02/2015 CLINICAL DATA:  82 year old female with a suspicious mass in the left breast at 4:30 and an indeterminate cystic mass in the left breast at the 4 o'clock position. EXAM: ULTRASOUND GUIDED LEFT BREAST CORE NEEDLE BIOPSY COMPARISON:  Previous exam(s). FINDINGS: I met with the patient and we discussed the procedure of ultrasound-guided biopsy, including benefits and alternatives. We discussed the high likelihood of a successful procedure. We discussed the risks of the procedure, including infection, bleeding, tissue injury, clip migration, and inadequate sampling. Informed written consent was given. The usual time-out protocol was performed immediately prior to the procedure. SITE 1: LEFT BREAST MASS 4:30 POSITION: Using sterile technique and 1% Lidocaine as local anesthetic, under direct ultrasound visualization, a 12 gauge spring-loaded device was used to perform biopsy of the mass in the left breast at the 4:30 position using a lateral to medial approach. At the conclusion of the procedure a ribbon shaped tissue marker clip was deployed into the biopsy cavity. SITE 2: LEFT BREAST CYSTIC MASS 4 O'CLOCK POSITION: Using  sterile technique and 1% Lidocaine as local anesthetic, under direct ultrasound visualization, a 12 gauge spring-loaded device was used to perform biopsy of the mass in the  left breast at the 4 o'clock position using a lateral to medial approach. At the conclusion of the procedure a coil shaped tissue marker clip was deployed into the biopsy cavity. Follow up 2 view mammogram was performed and dictated separately. IMPRESSION: 1. Ultrasound-guided biopsy of the mass in the left breast at the 4:30 position, at site of ribbon shaped biopsy marking clip. 2. Ultrasound-guided biopsy of the mass in the left breast at 4 o'clock position, at site of coil shaped biopsy marking clip position. Electronically Signed: By: Everlean Alstrom M.D. On: 10/31/2015 14:05   [place] Lt Breast Bx W Loc Dev Ea Add Lesion Img Bx Spec US Guide  Addendum Date: 11/02/2015   ADDENDUM REPORT: 11/01/2015 14:11 ADDENDUM: Pathology revealed GRADE I-II INVASIVE DUCTAL CARCINOMA, DUCTAL CARCINOMA IN SITU of the Left breast at the 4:30 o'clock location. FIBROCYSTIC CHANGES of the Left breast at the 4:00 o'clock location. This was found to be concordant by Dr. Everlean Alstrom. Pathology results were discussed with the patient and her daughter, [name], by telephone. The patient reported doing well after the biopsies with tenderness at the sites. Post biopsy instructions and care were reviewed and questions were answered. The patient was encouraged to call [facility] for any additional concerns. Surgical consultation has been arranged with Dr. Autumn Messing at [facility] on November 06, 2015. Pathology results reported by Terie Purser, RN on 11/01/2015. Electronically Signed   By: Everlean Alstrom M.D.   On: 11/01/2015 14:11   Result Date: 11/02/2015 CLINICAL DATA:  82 year old female with a suspicious mass in the left breast at 4:30 and an indeterminate cystic mass in the left breast at the 4 o'clock position. EXAM: ULTRASOUND GUIDED LEFT BREAST CORE NEEDLE BIOPSY COMPARISON:  Previous exam(s). FINDINGS: I met with the patient and we discussed the procedure of  ultrasound-guided biopsy, including benefits and alternatives. We discussed the high likelihood of a successful procedure. We discussed the risks of the procedure, including infection, bleeding, tissue injury, clip migration, and inadequate sampling. Informed written consent was given. The usual time-out protocol was performed immediately prior to the procedure. SITE 1: LEFT BREAST MASS 4:30 POSITION: Using sterile technique and 1% Lidocaine as local anesthetic, under direct ultrasound visualization, a 12 gauge spring-loaded device was used to perform biopsy of the mass in the left breast at the 4:30 position using a lateral to medial approach. At the conclusion of the procedure a ribbon shaped tissue marker clip was deployed into the biopsy cavity. SITE 2: LEFT BREAST CYSTIC MASS 4 O'CLOCK POSITION: Using sterile technique and 1% Lidocaine as local anesthetic, under direct ultrasound visualization, a 12 gauge spring-loaded device was used to perform biopsy of the mass in the left breast at the 4 o'clock position using a lateral to medial approach. At the conclusion of the procedure a coil shaped tissue marker clip was deployed into the biopsy cavity. Follow up 2 view mammogram was performed and dictated separately. IMPRESSION: 1. Ultrasound-guided biopsy of the mass in the left breast at the 4:30 position, at site of ribbon shaped biopsy marking clip. 2. Ultrasound-guided biopsy of the mass in the left breast at 4 o'clock position, at site of coil shaped biopsy marking clip position. Electronically Signed: By: Everlean Alstrom M.D. On: 10/31/2015 14:05    ASSESSMENT & PLAN:  ***  No orders of the defined types were placed in this encounter.   All questions were answered. The patient knows to call the clinic with any problems, questions or concerns. I spent {CHL ONC TIME VISIT - [id number]} counseling the patient face to face. The total time spent in the appointment was {CHL ONC TIME VISIT -  EADGN:[id number]} and more than 50% was on counseling.     [name], MD 11/19/2015 10:24 PM

## 2015-11-20 ENCOUNTER — Ambulatory Visit: Payer: Medicare Other | Admitting: Hematology

## 2015-11-20 NOTE — Progress Notes (Signed)
[place]  [phone] [phone]  Clinic New Consult Note   Patient Care Team: [name], MD as PCP - General (Family Medicine) 11/21/2015  Referring physician Dr. Marlou Starks  CHIEF COMPLAINTS/PURPOSE OF CONSULTATION:  Newly diagnosed left breast cancer   Oncology History   Breast cancer of lower-outer quadrant of left female breast [facility])   Staging form: Breast, AJCC 7th Edition   - Clinical stage from 10/31/2015: Stage IA (T1c, N0, M0) - Signed by [name], MD on 11/19/2015      Breast cancer of lower-outer quadrant of left female breast ([place])   10/23/2015 Mammogram    Diagnostic mammo and [place] shpwed left breast 4:00 complex cystic mass, 4:30 area of shadowing measuring 1.0X1.1X1.2cm      10/31/2015 Initial Diagnosis    Breast cancer of lower-outer quadrant of left female breast ([place])      10/31/2015 Initial Biopsy    Left breast core needle biopsy at 4:30 showed invasive ductal carcinoma and DCIS, , G1-2, 4:00 position biopsy was negative for malignant cells       10/31/2015 Receptors her2    ER 100%+, PR 90%+, HER2-, Ki67 15%       HISTORY OF PRESENTING ILLNESS:  [name] 82 y.o. female with multiple medical comorbidities, including Dementia and bipolar, is here because of her recently diagnosed left breast cancer. She is accompanied by her husband, and her daughter to the clinic today. She was referred by her breast surgeon Dr. Marlou Starks.  This was discovered by screening mammogram, which showed a complex cystic mass at 4:00, and an area of shadowing measuring 1.2 cm at this for 30 o'clock position of left breast, both lesions were biopsied and the 4:30 o'clock lesion showed DCIS and invasive ductal carcinoma, ER/PR stripe positive, HER-2 negative, Ki-67 15%.  She had prior left breast biopsy for benign lesion, no family history of breast cancer or other malignancy.  She lives with her husband in an independent living  facility, she is able to take care of herself, both patient and her husband have dementia, they have a caregiver, who stays with them 8 hours a day.  MEDICAL HISTORY:  Past Medical History:  Diagnosis Date  . Bipolar affective ([place])    History of psychosis  . C. difficile diarrhea   . CAD (coronary artery disease)    LAD 30% 2006  . Cerebrovascular disease   . Colonic polyp   . Crohn's disease ([place])   . Diverticulosis of colon   . Fibromyalgia   . GERD (gastroesophageal reflux disease)   . History of atrial flutter   . History of bronchitis   . History of headache   . History of hyperparathyroidism   . History of tuberculosis   . Hyperlipemia   . Hypertension   . Hypothyroidism   . IBS (irritable bowel syndrome)   . Memory disturbance   . 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 Obesity   . OSA (obstructive sleep apnea)   . Osteopenia   . Other diseases of lung, not elsewhere classified   . Psychosis   . Radiation 05/2008   5000 cGy to left lower eyelid, parotid lymphatics and upper neck  . Restless legs syndrome (RLS)   . Syncope     SURGICAL HISTORY: Past Surgical History:  Procedure Laterality Date  . APPENDECTOMY    . BLADDER SUSPENSION    . CATARACT EXTRACTION Bilateral   . CHOLECYSTECTOMY    .  eye lid surgery  04/04/2011   [facility] by Dr. Norlene Duel  . HEEL SPUR EXCISION    . INCISION AND DRAINAGE PERIRECTAL ABSCESS    . LUMBAR LAMINECTOMY    . Moh';s surg left lower eye lid surgery and lid reconstruction surgery  2/10  . NASAL SINUS SURGERY Right    Right maxillary  . Parathyroid adenoma surgery    . POSTERIOR CERVICAL FUSION/FORAMINOTOMY N/A 10/12/2013   Procedure: Cervical five-Thoracic two cervico-thoracic posterior fusion;  Surgeon: [name], MD;  Location: [facility];  Service: Neurosurgery;  Laterality: N/A;  . TONSILLECTOMY AND ADENOIDECTOMY      SOCIAL HISTORY: Social History   Social History  . Marital status: Married    Spouse name:  Barnabas Lister  . Number of children: 2  . Years of education: N/A   Occupational History  .  Retired   Social History Main Topics  . Smoking status: Former Smoker    Years: 6.00    Types: Cigarettes    Quit date: 01/08/1960  . Smokeless tobacco: Never Used  . Alcohol use No  . Drug use: No  . Sexual activity: Not on file   Other Topics Concern  . Not on file   Social History Narrative   Patient is married with 2 children   Patient is right handed   Patient is a retired Marine scientist   Patient does not drink any caffeine.    FAMILY HISTORY: Family History  Problem Relation Age of Onset  . Heart failure Father   . Arthritis Mother   . Hypertension Mother   . Ulcers Mother   . Colon cancer Maternal Grandmother   . Pulmonary embolism      ALLERGIES:  is allergic to prednisone; carbamazepine; celebrex [celecoxib]; codeine; demerol [meperidine]; diclofenac sodium; fentanyl; fluoxetine hcl; gentamicin; hydromorphone hcl; ibuprofen; indomethacin; lisinopril; meperidine hcl; morphine; oruvail [ketoprofen]; pregabalin; propranolol hcl; refresh lacri-lube [artificial tears]; relafen [nabumetone]; ropinirole hcl; sulfamethoxazole; white petrolatum-mineral oil  [aquaphilic]; and bacitracin.  MEDICATIONS:  Current Outpatient Prescriptions  Medication Sig Dispense Refill  . amLODipine (NORVASC) 5 MG tablet Take 1 tablet (5 mg total) by mouth daily. 90 tablet 3  . atorvastatin (LIPITOR) 40 MG tablet Take 1 tablet (40 mg total) by mouth daily. 90 tablet 1  . Calcium-Magnesium 500-250 MG TABS Take 2 tablets by mouth daily at 6 PM. 60 each 11  . Cholecalciferol (VITAMIN D3) 2000 units TABS Take 2,000 Units by mouth daily at 12 noon. 30 tablet 11  . dicyclomine (BENTYL) 10 MG capsule Take 1 capsule (10 mg total) by mouth 3 (three) times daily before meals. 20 capsule 0  . donepezil (ARICEPT) 5 MG tablet TK 2 T PO HS  5  . fluticasone (FLONASE) 50 MCG/ACT nasal spray Place 2 sprays into both nostrils at  bedtime. 16 g 2  . levothyroxine (SYNTHROID, LEVOTHROID) 88 MCG tablet Take 1 tablet (88 mcg total) by mouth daily before breakfast. 30 tablet 11  . Melatonin 10 MG TABS Take 10 mg by mouth 1 day or 1 dose. (Patient taking differently: Take 10 mg by mouth at bedtime. ) 30 tablet 5  . memantine (NAMENDA) 10 MG tablet TAKE 1 TABLET (10 MG TOTAL) BY MOUTH TWO (TWO) TIMES DAILY. 180 tablet 2  . mesalamine (LIALDA) 1.2 g EC tablet Take 3 tablets (3.6 g total) by mouth daily with lunch. 90 tablet 5  . pramipexole (MIRAPEX) 0.5 MG tablet TAKE 1 TABLET TWICE A DAY (NEED OFFICE VISIT) 180 tablet  0  . Propylene Glycol (SYSTANE BALANCE) 0.6 % SOLN Place 1 drop into both eyes daily.    . risperiDONE (RISPERDAL) 0.5 MG tablet Take 1 tablet (0.5 mg total) by mouth at bedtime. 90 tablet 3  . rivaroxaban (XARELTO) 20 MG TABS tablet Take 1 tablet (20 mg total) by mouth daily with supper. 90 tablet 2  . traMADol (ULTRAM) 50 MG tablet Take 50 mg by mouth every 6 (six) hours as needed for moderate pain or severe pain.     No current facility-administered medications for this visit.     REVIEW OF SYSTEMS:   Constitutional: Denies fevers, chills or abnormal night sweats Eyes: Denies blurriness of vision, double vision or watery eyes Ears, nose, mouth, throat, and face: Denies mucositis or sore throat Respiratory: Denies cough, dyspnea or wheezes Cardiovascular: Denies palpitation, chest discomfort or lower extremity swelling Gastrointestinal:  Denies nausea, heartburn or change in bowel habits Skin: Denies abnormal skin rashes Lymphatics: Denies new lymphadenopathy or easy bruising Neurological:Denies numbness, tingling or new weaknesses Behavioral/Psych: Mood is stable, no new changes  All other systems were reviewed with the patient and are negative.  PHYSICAL EXAMINATION: ECOG PERFORMANCE STATUS: 2 - Symptomatic, <50% confined to bed  Vitals:   11/21/15 1054  BP: (!) 175/72  Pulse: (!) 110  Resp: 17     Filed Weights   11/21/15 1054  Weight: 179 lb 14.4 oz (81.6 kg)    GENERAL:alert, no distress and comfortable SKIN: skin color, texture, turgor are normal, no rashes or significant lesions EYES: normal, conjunctiva are pink and non-injected, sclera clear OROPHARYNX:no exudate, no erythema and lips, buccal mucosa, and tongue normal  NECK: supple, thyroid normal size, non-tender, without nodularity LYMPH:  no palpable lymphadenopathy in the cervical, axillary or inguinal LUNGS: clear to auscultation and percussion with normal breathing effort HEART: regular rate & rhythm and no murmurs and no lower extremity edema ABDOMEN:abdomen soft, non-tender and normal bowel sounds Musculoskeletal:no cyanosis of digits and no clubbing  PSYCH: alert & oriented x 3 with fluent speech NEURO: no focal motor/sensory deficits  LABORATORY DATA:  I have reviewed the data as listed CBC Latest Ref Rng & Units 04/11/2015 03/12/2015 02/17/2015  WBC 4.0 - 10.5 K/uL 8.2 4.8 5.4  Hemoglobin 12.0 - 15.0 g/dL 13.9 13.6 12.0  Hematocrit 36.0 - 46.0 % 40.7 42.8 36.6  Platelets 150 - 400 K/uL 213 191 143(L)   CMP Latest Ref Rng & Units 04/11/2015 03/12/2015 02/17/2015  Glucose 65 - 99 mg/dL 119(H) 113(H) 89  BUN 6 - 20 mg/dL 25(H) 18 16  Creatinine 0.44 - 1.00 mg/dL 0.79 0.80 0.58  Sodium 135 - 145 mmol/L 140 141 141  Potassium 3.5 - 5.1 mmol/L 4.2 3.9 3.9  Chloride 101 - 111 mmol/L 106 105 107  CO2 22 - 32 mmol/L 23 27 25   Calcium 8.9 - 10.3 mg/dL 9.6 9.5 8.9  Total Protein 6.5 - 8.1 g/dL 8.0 7.2 5.8(L)  Total Bilirubin 0.3 - 1.2 mg/dL 0.7 0.7 0.9  Alkaline Phos 38 - 126 U/L 68 67 57  AST 15 - 41 U/L 33 26 18  ALT 14 - 54 U/L 23 20 16    Pathology report  Diagnosis 10/31/2015 1. Breast, left, needle core biopsy, 4:30, mass - INVASIVE DUCTAL CARCINOMA. - DUCTAL CARCINOMA IN SITU. - SEE COMMENT. 2. Breast, left, needle core biopsy, 4 o'clock, cystic mass - FIBROCYSTIC CHANGES. - THERE IS NO EVIDENCE OF  MALIGNANCY. Microscopic Comment 1. The carcinoma appears grade 1-2. A  breast prognostic profile will be performed and the results reported separately. The results were called to [facility] on 11/01/15. (JBK:gt, 11/01/15) Results: IMMUNOHISTOCHEMICAL AND MORPHOMETRIC ANALYSIS PERFORMED MANUALLY Estrogen Receptor: 100%, POSITIVE, STRONG STAINING INTENSITY Progesterone Receptor: 90%, POSITIVE, STRONG STAINING INTENSITY Proliferation Marker Ki67: 15%  Results: HER2 - NEGATIVE RATIO OF HER2/CEP17 SIGNALS 1.37 AVERAGE HER2 COPY NUMBER PER CELL 2.80  RADIOGRAPHIC STUDIES: I have personally reviewed the radiological images as listed and agreed with the findings in the report. US Breast Ltd Uni Left Inc Axilla  Result Date: 10/23/2015 CLINICAL DATA:  Left breast upper outer quadrant focal asymmetry seen on most recent screening mammography. EXAM: 2D DIGITAL DIAGNOSTIC LEFT MAMMOGRAM WITH CAD AND ADJUNCT TOMO ULTRASOUND LEFT BREAST COMPARISON:  Previous exam(s). ACR Breast Density Category c: The breast tissue is heterogeneously dense, which may obscure small masses. FINDINGS: Additional mammographic views of the left breast demonstrate persistent circumscribed mass in the left breast slightly upper outer quadrant, anterior depth. Additionally, there is a lobulated mass in the left slightly lower outer quadrant, posterior depth. Mammographic images were processed with CAD. On physical exam, there is thickening in the left breast lower outer quadrant, middle depth. Targeted ultrasound is performed, showing a benign-appearing cyst in the left 2 o'clock breast 1 cm from the nipple containing mobile debris. It measures 1.6 x 0.9 x 1.3 cm and likely corresponds to the mammographically seen oval mass in the left breast upper outer quadrant, anterior depth. Additionally, there is a complex cystic mass in the left breast 4 o'clock 4 cm from the nipple which measures 1.1 by 0.9 by 0.8 cm. In  the left breast 4:30 o'clock 5 cm from nipple, there is an area of shadowing which measures 1.1 x 1.0 x 1.2 cm. There is no evidence of left axillary lymphadenopathy. IMPRESSION: Left breast 4 o'clock complex cystic mass. Left breast 4:30 o'clock area of shadowing. RECOMMENDATION: Ultrasound-guided core needle biopsy is recommended for both of these findings. I have discussed the findings and recommendations with the patient. Results were also provided in writing at the conclusion of the visit. If applicable, a reminder letter will be sent to the patient regarding the next appointment. BI-RADS CATEGORY  4: Suspicious. Electronically Signed   By: Fidela Salisbury M.D.   On: 10/23/2015 17:35   Mm Diag Breast Tomo Uni Left  Result Date: 10/23/2015 CLINICAL DATA:  Left breast upper outer quadrant focal asymmetry seen on most recent screening mammography. EXAM: 2D DIGITAL DIAGNOSTIC LEFT MAMMOGRAM WITH CAD AND ADJUNCT TOMO ULTRASOUND LEFT BREAST COMPARISON:  Previous exam(s). ACR Breast Density Category c: The breast tissue is heterogeneously dense, which may obscure small masses. FINDINGS: Additional mammographic views of the left breast demonstrate persistent circumscribed mass in the left breast slightly upper outer quadrant, anterior depth. Additionally, there is a lobulated mass in the left slightly lower outer quadrant, posterior depth. Mammographic images were processed with CAD. On physical exam, there is thickening in the left breast lower outer quadrant, middle depth. Targeted ultrasound is performed, showing a benign-appearing cyst in the left 2 o'clock breast 1 cm from the nipple containing mobile debris. It measures 1.6 x 0.9 x 1.3 cm and likely corresponds to the mammographically seen oval mass in the left breast upper outer quadrant, anterior depth. Additionally, there is a complex cystic mass in the left breast 4 o'clock 4 cm from the nipple which measures 1.1 by 0.9 by 0.8 cm. In the left breast  4:30 o'clock 5 cm from  nipple, there is an area of shadowing which measures 1.1 x 1.0 x 1.2 cm. There is no evidence of left axillary lymphadenopathy. IMPRESSION: Left breast 4 o'clock complex cystic mass. Left breast 4:30 o'clock area of shadowing. RECOMMENDATION: Ultrasound-guided core needle biopsy is recommended for both of these findings. I have discussed the findings and recommendations with the patient. Results were also provided in writing at the conclusion of the visit. If applicable, a reminder letter will be sent to the patient regarding the next appointment. BI-RADS CATEGORY  4: Suspicious. Electronically Signed   By: Fidela Salisbury M.D.   On: 10/23/2015 17:35   Mm Clip Placement Left  Addendum Date: 11/03/2015   ADDENDUM REPORT: 11/03/2015 08:04 ADDENDUM: An addendum is made to the findings portion of the exam due to a typographical error. This should read as: FINDINGS: Mammographic images were obtained following ultrasound guided biopsy of a mass in the left breast at the 4:30 position and ultrasound-guided biopsy of a mass in the left breast at the 4 o'clock position . A ribbon shaped biopsy marking clip is present at the site of the biopsied mass in the left breast at the 4:30 position. A coil shaped biopsy marking clip is present at the site of the biopsied mass in the left breast at the 4 o'clock position. Electronically Signed   By: Everlean Alstrom M.D.   On: 11/03/2015 08:04   Result Date: 11/03/2015 CLINICAL DATA:  Post ultrasound-guided biopsy of a mass in the left breast at the 4:30 position and ultrasound-guided biopsy of a cystic mass in the left breast at 4 o'clock position. EXAM: DIAGNOSTIC LEFT MAMMOGRAM POST ULTRASOUND BIOPSY COMPARISON:  Previous exam(s). FINDINGS: Mammographic images were obtained following ultrasound guided biopsy of a mass in the left breast at the 4:30 position and ultrasound-guided biopsy of a mass in the left breast at the 4 o'clock position. A  ribbon shaped biopsy marking clip is present at the site of the biopsied mass in the left breast at the 4:30 position. Eighty coil shaped biopsy marking clip is present at the site of the biopsied mass in the left breast at the 4 o'clock position. IMPRESSION: 1. Appropriate ribbon shaped biopsy marking clip post ultrasound-guided biopsy of a mass in the left breast at the 4:30 position, site 1. 2. Appropriate coil shaped biopsy marking clip post ultrasound-guided biopsy of a mass in the left breast at the 4 o'clock position, site 2. Final Assessment: Post Procedure Mammograms for Marker Placement Electronically Signed: By: Everlean Alstrom M.D. On: 10/31/2015 14:20   [place] Lt Breast Bx W Loc Dev 1st Lesion Img Bx Spec US Guide  Addendum Date: 11/02/2015   ADDENDUM REPORT: 11/01/2015 14:11 ADDENDUM: Pathology revealed GRADE I-II INVASIVE DUCTAL CARCINOMA, DUCTAL CARCINOMA IN SITU of the Left breast at the 4:30 o'clock location. FIBROCYSTIC CHANGES of the Left breast at the 4:00 o'clock location. This was found to be concordant by Dr. Everlean Alstrom. Pathology results were discussed with the patient and her daughter, Emeline Simpson, by telephone. The patient reported doing well after the biopsies with tenderness at the sites. Post biopsy instructions and care were reviewed and questions were answered. The patient was encouraged to call [facility] for any additional concerns. Surgical consultation has been arranged with Dr. Autumn Messing at [facility] on November 06, 2015. Pathology results reported by Terie Purser, RN on 11/01/2015. Electronically Signed   By: Everlean Alstrom M.D.   On: 11/01/2015 14:11  Result Date: 11/02/2015 CLINICAL DATA:  82 year old female with a suspicious mass in the left breast at 4:30 and an indeterminate cystic mass in the left breast at the 4 o'clock position. EXAM: ULTRASOUND GUIDED LEFT BREAST CORE NEEDLE BIOPSY COMPARISON:  Previous exam(s).  FINDINGS: I met with the patient and we discussed the procedure of ultrasound-guided biopsy, including benefits and alternatives. We discussed the high likelihood of a successful procedure. We discussed the risks of the procedure, including infection, bleeding, tissue injury, clip migration, and inadequate sampling. Informed written consent was given. The usual time-out protocol was performed immediately prior to the procedure. SITE 1: LEFT BREAST MASS 4:30 POSITION: Using sterile technique and 1% Lidocaine as local anesthetic, under direct ultrasound visualization, a 12 gauge spring-loaded device was used to perform biopsy of the mass in the left breast at the 4:30 position using a lateral to medial approach. At the conclusion of the procedure a ribbon shaped tissue marker clip was deployed into the biopsy cavity. SITE 2: LEFT BREAST CYSTIC MASS 4 O'CLOCK POSITION: Using sterile technique and 1% Lidocaine as local anesthetic, under direct ultrasound visualization, a 12 gauge spring-loaded device was used to perform biopsy of the mass in the left breast at the 4 o'clock position using a lateral to medial approach. At the conclusion of the procedure a coil shaped tissue marker clip was deployed into the biopsy cavity. Follow up 2 view mammogram was performed and dictated separately. IMPRESSION: 1. Ultrasound-guided biopsy of the mass in the left breast at the 4:30 position, at site of ribbon shaped biopsy marking clip. 2. Ultrasound-guided biopsy of the mass in the left breast at 4 o'clock position, at site of coil shaped biopsy marking clip position. Electronically Signed: By: Everlean Alstrom M.D. On: 10/31/2015 14:05   [place] Lt Breast Bx W Loc Dev Ea Add Lesion Img Bx Spec US Guide  Addendum Date: 11/02/2015   ADDENDUM REPORT: 11/01/2015 14:11 ADDENDUM: Pathology revealed GRADE I-II INVASIVE DUCTAL CARCINOMA, DUCTAL CARCINOMA IN SITU of the Left breast at the 4:30 o'clock location. FIBROCYSTIC CHANGES of the  Left breast at the 4:00 o'clock location. This was found to be concordant by Dr. Everlean Alstrom. Pathology results were discussed with the patient and her daughter, Alanni Vader, by telephone. The patient reported doing well after the biopsies with tenderness at the sites. Post biopsy instructions and care were reviewed and questions were answered. The patient was encouraged to call [facility] for any additional concerns. Surgical consultation has been arranged with Dr. Autumn Messing at [facility] on November 06, 2015. Pathology results reported by Terie Purser, RN on 11/01/2015. Electronically Signed   By: Everlean Alstrom M.D.   On: 11/01/2015 14:11   Result Date: 11/02/2015 CLINICAL DATA:  82 year old female with a suspicious mass in the left breast at 4:30 and an indeterminate cystic mass in the left breast at the 4 o'clock position. EXAM: ULTRASOUND GUIDED LEFT BREAST CORE NEEDLE BIOPSY COMPARISON:  Previous exam(s). FINDINGS: I met with the patient and we discussed the procedure of ultrasound-guided biopsy, including benefits and alternatives. We discussed the high likelihood of a successful procedure. We discussed the risks of the procedure, including infection, bleeding, tissue injury, clip migration, and inadequate sampling. Informed written consent was given. The usual time-out protocol was performed immediately prior to the procedure. SITE 1: LEFT BREAST MASS 4:30 POSITION: Using sterile technique and 1% Lidocaine as local anesthetic, under direct ultrasound visualization, a 12 gauge spring-loaded device was  used to perform biopsy of the mass in the left breast at the 4:30 position using a lateral to medial approach. At the conclusion of the procedure a ribbon shaped tissue marker clip was deployed into the biopsy cavity. SITE 2: LEFT BREAST CYSTIC MASS 4 O'CLOCK POSITION: Using sterile technique and 1% Lidocaine as local anesthetic, under direct ultrasound  visualization, a 12 gauge spring-loaded device was used to perform biopsy of the mass in the left breast at the 4 o'clock position using a lateral to medial approach. At the conclusion of the procedure a coil shaped tissue marker clip was deployed into the biopsy cavity. Follow up 2 view mammogram was performed and dictated separately. IMPRESSION: 1. Ultrasound-guided biopsy of the mass in the left breast at the 4:30 position, at site of ribbon shaped biopsy marking clip. 2. Ultrasound-guided biopsy of the mass in the left breast at 4 o'clock position, at site of coil shaped biopsy marking clip position. Electronically Signed: By: Everlean Alstrom M.D. On: 10/31/2015 14:05    ASSESSMENT & PLAN:  82 year old Caucasian female, with multiple comorbidities, including hypertension, history of TIA, atrial fibrillation on Xarelto, dementia, bipolar, presented with screening discovered left breast cancer  1. Breast cancer of lower-outer quadrant of left breast, invasive and in situ ductal carcinoma, cT1cN0M0, stage IA, ER+/PR+/HER2- ---We discussed her imaging findings and the biopsy results in great details. -Giving the early stage disease, she likely need a candidate for lumpectomy. She is agreeable with that. She was seen by Dr. Marlou Starks  and likely will proceed with surgery soon. Given her advanced age, and critical negative lymph nodes, I'll be okay without sentinel lymph node biopsy. -Giving her advanced age, medical comorbidities, especially dementia and bipolar, I would not recommend Oncotype test or adjuvant chemotherapy -Giving the strong ER and PR expression in her postmenopausal status, I recommend adjuvant endocrine therapy with aromatase inhibitor for a total of 5-10 years to reduce the risk of cancer recurrence. Potential benefits and side effects were discussed with patient and she is interested. -Given her advanced age and comorbidities, it may be difficult for her to have adjuvant breast radiation,  and I think the benefit is limited. We'll discuss her case in our breast tumor board to see if radiation oncology agrees.  -We also discussed the breast cancer surveillance after her surgery. She will continue annual screening mammogram, self exam, and a routine office visit with lab and exam with [place]. -I encouraged her to have healthy diet and exercise regularly.   2. HTN, history of TIA, dementia, bipolar, AF -She'll continue follow-up with her primary care physician -She is on Xarelto for HIV fibrillation, which needs to be held before her breast surgery.  Plan -she will proceed with left breast lumpectomy by Dr. Marlou Starks soon -I plan to see her back 3-4 weeks after her surgery, to finalize her adjuvant endocrine therapy. -will discuss her case in breast tumor board after her surgery, to see if she can skip adjuvant radiation   All questions were answered. The patient knows to call the clinic with any problems, questions or concerns. I spent 55 minutes counseling the patient face to face. The total time spent in the appointment was 60 minutes and more than 50% was on counseling.     [name], MD 11/21/2015 11:58 AM

## 2015-11-21 ENCOUNTER — Ambulatory Visit (HOSPITAL_BASED_OUTPATIENT_CLINIC_OR_DEPARTMENT_OTHER): Payer: Medicare Other | Admitting: Hematology

## 2015-11-21 ENCOUNTER — Encounter: Payer: Self-pay | Admitting: Hematology

## 2015-11-21 VITALS — BP 175/72 | HR 110 | Resp 17 | Ht 64.0 in | Wt 179.9 lb

## 2015-11-21 DIAGNOSIS — Z800 Family history of malignant neoplasm of digestive organs: Secondary | ICD-10-CM

## 2015-11-21 DIAGNOSIS — I4891 Unspecified atrial fibrillation: Secondary | ICD-10-CM | POA: Diagnosis not present

## 2015-11-21 DIAGNOSIS — F0281 Dementia in other diseases classified elsewhere with behavioral disturbance: Secondary | ICD-10-CM

## 2015-11-21 DIAGNOSIS — I10 Essential (primary) hypertension: Secondary | ICD-10-CM | POA: Diagnosis not present

## 2015-11-21 DIAGNOSIS — F0390 Unspecified dementia without behavioral disturbance: Secondary | ICD-10-CM | POA: Diagnosis not present

## 2015-11-21 DIAGNOSIS — G301 Alzheimer's disease with late onset: Secondary | ICD-10-CM

## 2015-11-21 DIAGNOSIS — Z170 Estrogen receptor positive status [ER+]: Secondary | ICD-10-CM | POA: Diagnosis not present

## 2015-11-21 DIAGNOSIS — F319 Bipolar disorder, unspecified: Secondary | ICD-10-CM | POA: Diagnosis not present

## 2015-11-21 DIAGNOSIS — Z87891 Personal history of nicotine dependence: Secondary | ICD-10-CM | POA: Diagnosis not present

## 2015-11-21 DIAGNOSIS — C50512 Malignant neoplasm of lower-outer quadrant of left female breast: Secondary | ICD-10-CM | POA: Diagnosis not present

## 2015-11-21 DIAGNOSIS — I679 Cerebrovascular disease, unspecified: Secondary | ICD-10-CM

## 2015-11-21 DIAGNOSIS — F02818 Dementia in other diseases classified elsewhere, unspecified severity, with other behavioral disturbance: Secondary | ICD-10-CM

## 2015-11-22 ENCOUNTER — Ambulatory Visit: Payer: Self-pay | Admitting: General Surgery

## 2015-11-22 DIAGNOSIS — C50512 Malignant neoplasm of lower-outer quadrant of left female breast: Secondary | ICD-10-CM

## 2015-11-22 DIAGNOSIS — Z170 Estrogen receptor positive status [ER+]: Principal | ICD-10-CM

## 2015-11-23 ENCOUNTER — Telehealth: Payer: Self-pay | Admitting: *Deleted

## 2015-11-23 ENCOUNTER — Ambulatory Visit: Payer: Self-pay | Admitting: General Surgery

## 2015-11-23 DIAGNOSIS — Z170 Estrogen receptor positive status [ER+]: Principal | ICD-10-CM

## 2015-11-23 DIAGNOSIS — C50512 Malignant neoplasm of lower-outer quadrant of left female breast: Secondary | ICD-10-CM

## 2015-11-23 NOTE — Telephone Encounter (Signed)
  Oncology Nurse Navigator Documentation  Navigator Location: [facility] (11/23/15 1400) Referral date to RadOnc/MedOnc: 11/10/15 (11/23/15 1400) )Navigator Encounter Type: Introductory phone call (11/23/15 1400)   Abnormal Finding Date: 10/23/15 (11/23/15 1400) Confirmed Diagnosis Date: 10/31/15 (11/23/15 1400) Surgery Date: 12/08/15 (11/23/15 1400)           Treatment Initiated Date: 12/08/15 (11/23/15 1400) Patient Visit Type: MedOnc;Initial (11/23/15 1400) Treatment Phase: Pre-Tx/Tx Discussion (11/23/15 1400) Barriers/Navigation Needs: No barriers at this time;No Questions;No Needs (11/23/15 1400)   Interventions: None required (11/23/15 1400)            Acuity: Level 1 (11/23/15 1400)         Time Spent with Patient: 15 (11/23/15 1400)

## 2015-11-24 ENCOUNTER — Ambulatory Visit
Admission: RE | Admit: 2015-11-24 | Discharge: 2015-11-24 | Disposition: A | Discharge status: Home / Self Care | Payer: Medicare Other | Source: Ambulatory Visit | Attending: Radiation Oncology | Admitting: Radiation Oncology

## 2015-11-24 ENCOUNTER — Encounter: Payer: Self-pay | Admitting: Radiation Oncology

## 2015-11-24 ENCOUNTER — Telehealth: Payer: Self-pay | Admitting: *Deleted

## 2015-11-24 VITALS — BP 159/73 | HR 102 | Temp 97.7°F | Ht 64.0 in | Wt 178.6 lb

## 2015-11-24 DIAGNOSIS — Z87891 Personal history of nicotine dependence: Secondary | ICD-10-CM | POA: Diagnosis not present

## 2015-11-24 DIAGNOSIS — Z170 Estrogen receptor positive status [ER+]: Secondary | ICD-10-CM | POA: Diagnosis not present

## 2015-11-24 DIAGNOSIS — Z7901 Long term (current) use of anticoagulants: Secondary | ICD-10-CM | POA: Diagnosis not present

## 2015-11-24 DIAGNOSIS — Z7982 Long term (current) use of aspirin: Secondary | ICD-10-CM | POA: Insufficient documentation

## 2015-11-24 DIAGNOSIS — Z9049 Acquired absence of other specified parts of digestive tract: Secondary | ICD-10-CM | POA: Insufficient documentation

## 2015-11-24 DIAGNOSIS — Z79899 Other long term (current) drug therapy: Secondary | ICD-10-CM | POA: Insufficient documentation

## 2015-11-24 DIAGNOSIS — Z9889 Other specified postprocedural states: Secondary | ICD-10-CM | POA: Diagnosis not present

## 2015-11-24 DIAGNOSIS — C50512 Malignant neoplasm of lower-outer quadrant of left female breast: Secondary | ICD-10-CM | POA: Diagnosis not present

## 2015-11-24 NOTE — Telephone Encounter (Signed)
"  Calling to confirm my client's appointment with Dr. Judson Roch.  We will leave now and arrive within ten minutes."

## 2015-11-24 NOTE — Progress Notes (Signed)
Radiation Oncology         [phone] ________________________________  Initial Outpatient Consultation  Name: [name] MRN: [id number]  Date: 11/24/2015  DOB: 04/18/1932  DE:YCXKGYJEH,UDJSH W, MD  [name], MD   REFERRING PHYSICIAN: Autumn Messing III, MD  DIAGNOSIS:    ICD-9-CM ICD-10-CM   1. Malignant neoplasm of lower-outer quadrant of left breast of female, estrogen receptor positive ([place]) 174.5 C50.512    V86.0 Z17.0    Stage IA (T1c, N0, M0) Left Breast LOQ Invasive Ductal Carcinoma, ER 100%+ / PR 90%+ / Her2-, Grade 1-2  CHIEF COMPLAINT: Here to discuss management of stage IA left breast cancer  HISTORY OF PRESENT ILLNESS::[name] is a 82 y.o. female who presented with stage IA invasive ductal carcinoma. This was found on screening mammography.  Biopsy showed invasive ductal carcinoma with DCIS, Grade 1-2, at the 4:30 position with characteristics as described above in the diagnosis.    She plans on lumpectomy with Dr Marlou Starks.  She saw Dr Burr Medico to discuss anti estrogen therapy. She is not a candidate for chemotherapy   She is with her husband and caregiver (hired) today.  Patient is a limited historian, with limited short term memory.  She has anti yeast cream applied to left inframammary fold.  PREVIOUS RADIATION THERAPY: Yes  2010 : Left lower eyelid, parotid lymphatics, and upper neck treated to 5000cGy  PAST MEDICAL HISTORY:  has a past medical history of Bipolar affective ([place]); C. difficile diarrhea; CAD (coronary artery disease); Cerebrovascular disease; Colonic polyp; Crohn's disease ([place]); Diverticulosis of colon; Fibromyalgia; GERD (gastroesophageal reflux disease); History of atrial flutter; History of bronchitis; History of headache; History of hyperparathyroidism; History of tuberculosis; Hyperlipemia; Hypertension; Hypothyroidism; IBS (irritable bowel syndrome); Memory disturbance; Merkel cell tumor ([place]); Obesity; OSA (obstructive sleep apnea);  Osteopenia; Other diseases of lung, not elsewhere classified; Psychosis; Radiation (05/2008); Restless legs syndrome (RLS); and Syncope.    PAST SURGICAL HISTORY: Past Surgical History:  Procedure Laterality Date  . APPENDECTOMY    . BLADDER SUSPENSION    . CATARACT EXTRACTION Bilateral   . CHOLECYSTECTOMY    . eye lid surgery  04/04/2011   [facility] by Dr. Norlene Duel  . HEEL SPUR EXCISION    . INCISION AND DRAINAGE PERIRECTAL ABSCESS    . LUMBAR LAMINECTOMY    . Moh';s surg left lower eye lid surgery and lid reconstruction surgery  2/10  . NASAL SINUS SURGERY Right    Right maxillary  . Parathyroid adenoma surgery    . POSTERIOR CERVICAL FUSION/FORAMINOTOMY N/A 10/12/2013   Procedure: Cervical five-Thoracic two cervico-thoracic posterior fusion;  Surgeon: [name], MD;  Location: [facility];  Service: Neurosurgery;  Laterality: N/A;  . TONSILLECTOMY AND ADENOIDECTOMY      FAMILY HISTORY: family history includes Arthritis in her mother; Colon cancer in her maternal grandmother; Heart failure in her father; Hypertension in her mother; Ulcers in her mother.  SOCIAL HISTORY:  reports that she quit smoking about 55 years ago. Her smoking use included Cigarettes. She quit after 6.00 years of use. She has never used smokeless tobacco. She reports that she does not drink alcohol or use drugs.  ALLERGIES: Prednisone; Carbamazepine; Celebrex [celecoxib]; Codeine; Demerol [meperidine]; Diclofenac sodium; Fentanyl; Fluoxetine hcl; Gentamicin; Hydromorphone hcl; Ibuprofen; Indomethacin; Lisinopril; Meperidine hcl; Morphine; Oruvail [ketoprofen]; Pregabalin; Propranolol hcl; Refresh lacri-lube [artificial tears]; Relafen [nabumetone]; Ropinirole hcl; Sulfamethoxazole; White petrolatum-mineral oil  [aquaphilic]; and Bacitracin  MEDICATIONS:  Current Outpatient Prescriptions  Medication Sig Dispense Refill  .  amLODipine (NORVASC) 5 MG tablet Take 1 tablet (5 mg total) by mouth daily. 90 tablet 3  .  aspirin 81 MG EC tablet Take 81 mg by mouth.    Marland Kitchen atorvastatin (LIPITOR) 40 MG tablet Take 1 tablet (40 mg total) by mouth daily. 90 tablet 1  . Biotin 10 MG TABS Take 10 mg by mouth.    . Calcium-Magnesium 500-250 MG TABS Take 2 tablets by mouth daily at 6 PM. 60 each 11  . Cholecalciferol (VITAMIN D3) 2000 units TABS Take 2,000 Units by mouth daily at 12 noon. 30 tablet 11  . cholestyramine (QUESTRAN) 4 GM/DOSE powder Take by mouth.    . dicyclomine (BENTYL) 10 MG capsule Take 1 capsule (10 mg total) by mouth 3 (three) times daily before meals. 20 capsule 0  . donepezil (ARICEPT) 5 MG tablet TK 2 T PO HS  5  . fluticasone (FLONASE) 50 MCG/ACT nasal spray Place 2 sprays into both nostrils at bedtime. 16 g 2  . Lactobacillus (ACIDOPHILUS) TABS Take by mouth.    . levothyroxine (SYNTHROID, LEVOTHROID) 88 MCG tablet Take 1 tablet (88 mcg total) by mouth daily before breakfast. 30 tablet 11  . lidocaine (LIDODERM) 5 % Frequency:PRN   Dosage:0.0     Instructions:  Note:Dose: 1    . meclizine (ANTIVERT) 25 MG tablet Take 25 mg by mouth.    . Melatonin 10 MG TABS Take 10 mg by mouth 1 day or 1 dose. (Patient taking differently: Take 10 mg by mouth at bedtime. ) 30 tablet 5  . mesalamine (LIALDA) 1.2 g EC tablet Take 3 tablets (3.6 g total) by mouth daily with lunch. 90 tablet 5  . Omega-3 Fatty Acids (FISH OIL PO) Take by mouth.    . pramipexole (MIRAPEX) 0.5 MG tablet TAKE 1 TABLET TWICE A DAY (NEED OFFICE VISIT) 180 tablet 0  . Propylene Glycol (SYSTANE BALANCE) 0.6 % SOLN Place 1 drop into both eyes daily.    . risperiDONE (RISPERDAL) 0.5 MG tablet Take 1 tablet (0.5 mg total) by mouth at bedtime. 90 tablet 3  . rivaroxaban (XARELTO) 20 MG TABS tablet Take 1 tablet (20 mg total) by mouth daily with supper. 90 tablet 2  . Simethicone 180 MG CAPS Take 180 mg by mouth.    . traMADol (ULTRAM) 50 MG tablet Take 50 mg by mouth every 6 (six) hours as needed for moderate pain or severe pain.    . vitamin  C (ASCORBIC ACID) 500 MG tablet Frequency:QD   Dosage:0.0     Instructions:  Note:Dose: 1    . loperamide (IMODIUM) 2 MG capsule Take 2 mg by mouth.    . memantine (NAMENDA) 10 MG tablet TAKE 1 TABLET (10 MG TOTAL) BY MOUTH TWO (TWO) TIMES DAILY. 180 tablet 2   No current facility-administered medications for this encounter.     REVIEW OF SYSTEMS:  On review of systems, the patient reports that she is doing well overall. She denies any chest pain, shortness of breath, cough, fevers, chills, night sweats, unintended weight changes. She denies any depression, anxiety bowel or bladder disturbances, and denies abdominal pain, nausea or vomiting. She denies any new musculoskeletal or joint aches or pains. A complete 10 point review of systems is obtained and is otherwise negative. Any pertinent positives in HPI.    PHYSICAL EXAM:  height is 5' 4" (1.626 m) and weight is 178 lb 9.6 oz (81 kg). Her temperature is 97.7 F (36.5 C). Her blood  pressure is 159/73 (abnormal) and her pulse is 102 (abnormal). Her oxygen saturation is 96%.   General: Alert / in no acute distress HEENT: Head is normocephalic. Extraocular movements are intact. Some webbing of the upper and lower left eyelid, consistent with effects of prior cancer treatments. Oropharynx is clear.  No palpable masses in the pre/post auricular regions. Neck: Neck is supple, no palpable cervical or supraclavicular lymphadenopathy. Heart: Slightly tachycardic, but otherwise regular in rhythm with no murmurs. Chest: Clear to auscultation bilaterally, with no rhonchi, wheezes, or rales.  Abdomen: Soft, nontender, nondistended, with no rigidity or guarding. Extremities: No cyanosis or edema. Lymphatics: see Neck Exam. No swelling in extremities. Skin: No concerning lesions. Scar (vertical) off upper back Musculoskeletal: symmetric strength and muscle tone throughout. Neurologic: Cranial nerves II through XII are grossly intact. No obvious focalities.  Speech is fluent.  Signs of dementia.  Psychiatric:  Affect is appropriate. Breasts: Lower outer quadrant of the left breast shows some palpable thickening and it is tender. It is difficult to measure but the thickening is approximately 2 cm in size. No palpable axillary masses on the left. No palpable masses in right axilla or right breast. No other palpable masses appreciated in the breasts or axillae.   ECOG = 3  0 - Asymptomatic (Fully active, able to carry on all predisease activities without restriction)  1 - Symptomatic but completely ambulatory (Restricted in physically strenuous activity but ambulatory and able to carry out work of a light or sedentary nature. For example, light housework, office work)  2 - Symptomatic, <50% in bed during the day (Ambulatory and capable of all self care but unable to carry out any work activities. Up and about more than 50% of waking hours)  3 - Symptomatic, >50% in bed, but not bedbound (Capable of only limited self-care, confined to bed or chair 50% or more of waking hours)  4 - Bedbound (Completely disabled. Cannot carry on any self-care. Totally confined to bed or chair)  5 - Death   Eustace Pen MM, Creech RH, Tormey DC, et al. [phone]). "Toxicity and response criteria of the [facility]". [place] Oncol. 5 (6): 649-55   LABORATORY DATA:  Lab Results  Component Value Date   WBC 8.2 04/11/2015   HGB 13.9 04/11/2015   HCT 40.7 04/11/2015   MCV 92.3 04/11/2015   PLT 213 04/11/2015   CMP     Component Value Date/Time   NA 140 04/11/2015 1334   K 4.2 04/11/2015 1334   CL 106 04/11/2015 1334   CO2 23 04/11/2015 1334   GLUCOSE 119 (H) 04/11/2015 1334   BUN 25 (H) 04/11/2015 1334   CREATININE 0.79 04/11/2015 1334   CALCIUM 9.6 04/11/2015 1334   PROT 8.0 04/11/2015 1334   ALBUMIN 4.5 04/11/2015 1334   AST 33 04/11/2015 1334   ALT 23 04/11/2015 1334   ALKPHOS 68 04/11/2015 1334   BILITOT 0.7 04/11/2015 1334    GFRNONAA >60 04/11/2015 1334   GFRAA >60 04/11/2015 1334         RADIOGRAPHY: Mm Clip Placement Left  Addendum Date: 11/03/2015   ADDENDUM REPORT: 11/03/2015 08:04 ADDENDUM: An addendum is made to the findings portion of the exam due to a typographical error. This should read as: FINDINGS: Mammographic images were obtained following ultrasound guided biopsy of a mass in the left breast at the 4:30 position and ultrasound-guided biopsy of a mass in the left breast at the 4 o'clock position . A  ribbon shaped biopsy marking clip is present at the site of the biopsied mass in the left breast at the 4:30 position. A coil shaped biopsy marking clip is present at the site of the biopsied mass in the left breast at the 4 o'clock position. Electronically Signed   By: Everlean Alstrom M.D.   On: 11/03/2015 08:04   Result Date: 11/03/2015 CLINICAL DATA:  Post ultrasound-guided biopsy of a mass in the left breast at the 4:30 position and ultrasound-guided biopsy of a cystic mass in the left breast at 4 o'clock position. EXAM: DIAGNOSTIC LEFT MAMMOGRAM POST ULTRASOUND BIOPSY COMPARISON:  Previous exam(s). FINDINGS: Mammographic images were obtained following ultrasound guided biopsy of a mass in the left breast at the 4:30 position and ultrasound-guided biopsy of a mass in the left breast at the 4 o'clock position. A ribbon shaped biopsy marking clip is present at the site of the biopsied mass in the left breast at the 4:30 position. Eighty coil shaped biopsy marking clip is present at the site of the biopsied mass in the left breast at the 4 o'clock position. IMPRESSION: 1. Appropriate ribbon shaped biopsy marking clip post ultrasound-guided biopsy of a mass in the left breast at the 4:30 position, site 1. 2. Appropriate coil shaped biopsy marking clip post ultrasound-guided biopsy of a mass in the left breast at the 4 o'clock position, site 2. Final Assessment: Post Procedure Mammograms for Marker Placement  Electronically Signed: By: Everlean Alstrom M.D. On: 10/31/2015 14:20   [place] Lt Breast Bx W Loc Dev 1st Lesion Img Bx Spec US Guide  Addendum Date: 11/02/2015   ADDENDUM REPORT: 11/01/2015 14:11 ADDENDUM: Pathology revealed GRADE I-II INVASIVE DUCTAL CARCINOMA, DUCTAL CARCINOMA IN SITU of the Left breast at the 4:30 o'clock location. FIBROCYSTIC CHANGES of the Left breast at the 4:00 o'clock location. This was found to be concordant by Dr. Everlean Alstrom. Pathology results were discussed with the patient and her daughter, Maeli Spacek, by telephone. The patient reported doing well after the biopsies with tenderness at the sites. Post biopsy instructions and care were reviewed and questions were answered. The patient was encouraged to call [facility] for any additional concerns. Surgical consultation has been arranged with Dr. Autumn Messing at [facility] on November 06, 2015. Pathology results reported by Terie Purser, RN on 11/01/2015. Electronically Signed   By: Everlean Alstrom M.D.   On: 11/01/2015 14:11   Result Date: 11/02/2015 CLINICAL DATA:  82 year old female with a suspicious mass in the left breast at 4:30 and an indeterminate cystic mass in the left breast at the 4 o'clock position. EXAM: ULTRASOUND GUIDED LEFT BREAST CORE NEEDLE BIOPSY COMPARISON:  Previous exam(s). FINDINGS: I met with the patient and we discussed the procedure of ultrasound-guided biopsy, including benefits and alternatives. We discussed the high likelihood of a successful procedure. We discussed the risks of the procedure, including infection, bleeding, tissue injury, clip migration, and inadequate sampling. Informed written consent was given. The usual time-out protocol was performed immediately prior to the procedure. SITE 1: LEFT BREAST MASS 4:30 POSITION: Using sterile technique and 1% Lidocaine as local anesthetic, under direct ultrasound visualization, a 12 gauge spring-loaded device  was used to perform biopsy of the mass in the left breast at the 4:30 position using a lateral to medial approach. At the conclusion of the procedure a ribbon shaped tissue marker clip was deployed into the biopsy cavity. SITE 2: LEFT BREAST CYSTIC MASS 4 O'CLOCK POSITION:  Using sterile technique and 1% Lidocaine as local anesthetic, under direct ultrasound visualization, a 12 gauge spring-loaded device was used to perform biopsy of the mass in the left breast at the 4 o'clock position using a lateral to medial approach. At the conclusion of the procedure a coil shaped tissue marker clip was deployed into the biopsy cavity. Follow up 2 view mammogram was performed and dictated separately. IMPRESSION: 1. Ultrasound-guided biopsy of the mass in the left breast at the 4:30 position, at site of ribbon shaped biopsy marking clip. 2. Ultrasound-guided biopsy of the mass in the left breast at 4 o'clock position, at site of coil shaped biopsy marking clip position. Electronically Signed: By: Everlean Alstrom M.D. On: 10/31/2015 14:05   [place] Lt Breast Bx W Loc Dev Ea Add Lesion Img Bx Spec US Guide  Addendum Date: 11/02/2015   ADDENDUM REPORT: 11/01/2015 14:11 ADDENDUM: Pathology revealed GRADE I-II INVASIVE DUCTAL CARCINOMA, DUCTAL CARCINOMA IN SITU of the Left breast at the 4:30 o'clock location. FIBROCYSTIC CHANGES of the Left breast at the 4:00 o'clock location. This was found to be concordant by Dr. Everlean Alstrom. Pathology results were discussed with the patient and her daughter, Maebell Lyvers, by telephone. The patient reported doing well after the biopsies with tenderness at the sites. Post biopsy instructions and care were reviewed and questions were answered. The patient was encouraged to call [facility] for any additional concerns. Surgical consultation has been arranged with Dr. Autumn Messing at [facility] on November 06, 2015. Pathology results reported by Terie Purser,  RN on 11/01/2015. Electronically Signed   By: Everlean Alstrom M.D.   On: 11/01/2015 14:11   Result Date: 11/02/2015 CLINICAL DATA:  82 year old female with a suspicious mass in the left breast at 4:30 and an indeterminate cystic mass in the left breast at the 4 o'clock position. EXAM: ULTRASOUND GUIDED LEFT BREAST CORE NEEDLE BIOPSY COMPARISON:  Previous exam(s). FINDINGS: I met with the patient and we discussed the procedure of ultrasound-guided biopsy, including benefits and alternatives. We discussed the high likelihood of a successful procedure. We discussed the risks of the procedure, including infection, bleeding, tissue injury, clip migration, and inadequate sampling. Informed written consent was given. The usual time-out protocol was performed immediately prior to the procedure. SITE 1: LEFT BREAST MASS 4:30 POSITION: Using sterile technique and 1% Lidocaine as local anesthetic, under direct ultrasound visualization, a 12 gauge spring-loaded device was used to perform biopsy of the mass in the left breast at the 4:30 position using a lateral to medial approach. At the conclusion of the procedure a ribbon shaped tissue marker clip was deployed into the biopsy cavity. SITE 2: LEFT BREAST CYSTIC MASS 4 O'CLOCK POSITION: Using sterile technique and 1% Lidocaine as local anesthetic, under direct ultrasound visualization, a 12 gauge spring-loaded device was used to perform biopsy of the mass in the left breast at the 4 o'clock position using a lateral to medial approach. At the conclusion of the procedure a coil shaped tissue marker clip was deployed into the biopsy cavity. Follow up 2 view mammogram was performed and dictated separately. IMPRESSION: 1. Ultrasound-guided biopsy of the mass in the left breast at the 4:30 position, at site of ribbon shaped biopsy marking clip. 2. Ultrasound-guided biopsy of the mass in the left breast at 4 o'clock position, at site of coil shaped biopsy marking clip position.  Electronically Signed: By: Everlean Alstrom M.D. On: 10/31/2015 14:05  IMPRESSION/PLAN: stage IA breast cancer It was a pleasure meeting the patient today. We discussed the risks, benefits, and side effects of radiotherapy. This patient has a limited performance stature and relatively low risk cancer. I anticipate the benefits of radiation would be very limited, and at this time I would not recommend adjuvant radiation treatment.  We did discuss the logistics of radiotherapy for the family's and patient's education.  Patient is planned to undergo lumpectomy followed by antiestrogen therapy. Radiation would be relatively difficult for her given her comorbidities. I would only recommend radiation therapy if her prognosis appeared significantly heightened after her pathology results come back after surgery. We can discuss as needed at tumor board.   __________________________________________   Eppie Gibson, MD   This document serves as a record of services personally performed by Eppie Gibson, MD. It was created on her behalf by Maryla Morrow, a trained medical scribe. The creation of this record is based on the scribe's personal observations and the provider's statements to them. This document has been checked and approved by the attending provider.

## 2015-11-28 ENCOUNTER — Other Ambulatory Visit: Payer: Self-pay | Admitting: Family Medicine

## 2015-11-28 ENCOUNTER — Encounter: Payer: Medicare Other | Admitting: Family Medicine

## 2015-11-28 DIAGNOSIS — Z0289 Encounter for other administrative examinations: Secondary | ICD-10-CM

## 2015-11-29 ENCOUNTER — Other Ambulatory Visit: Payer: Self-pay | Admitting: General Surgery

## 2015-11-29 DIAGNOSIS — Z170 Estrogen receptor positive status [ER+]: Principal | ICD-10-CM

## 2015-11-29 DIAGNOSIS — C50512 Malignant neoplasm of lower-outer quadrant of left female breast: Secondary | ICD-10-CM

## 2015-12-06 ENCOUNTER — Ambulatory Visit
Admission: RE | Admit: 2015-12-06 | Discharge: 2015-12-06 | Disposition: A | Discharge status: Home / Self Care | Payer: Medicare Other | Source: Ambulatory Visit | Attending: General Surgery | Admitting: General Surgery

## 2015-12-06 ENCOUNTER — Encounter (HOSPITAL_COMMUNITY): Payer: Self-pay | Admitting: *Deleted

## 2015-12-06 DIAGNOSIS — Z170 Estrogen receptor positive status [ER+]: Principal | ICD-10-CM

## 2015-12-06 DIAGNOSIS — C50512 Malignant neoplasm of lower-outer quadrant of left female breast: Secondary | ICD-10-CM

## 2015-12-06 DIAGNOSIS — C50912 Malignant neoplasm of unspecified site of left female breast: Secondary | ICD-10-CM | POA: Diagnosis not present

## 2015-12-07 ENCOUNTER — Encounter (HOSPITAL_COMMUNITY): Payer: Self-pay | Admitting: *Deleted

## 2015-12-07 NOTE — Progress Notes (Signed)
Pt SDW- pre-op call completed by both pt and pt spouse, Barnabas Lister. Pt is a poor historian. Pt was unaware that surgery was scheduled for tomorrow and unsure of surgical procedure. Pt was unable to provide details of last dose of Xarelto. Pt stated " I don't know what medicine I take, I have it in a tray." Spouse was asked to clarify pt current medications and  Xarelto instructions; spouse was not aware of pt medications. He stated " she has a lot of supplements in here, she hasn't been to the doctors in a while to get any medication." Spoke with Ebony Hail, [place], Anesthesia, to make aware, spoke with Abigail Butts, RN to make surgeon aware. Abigail Butts provided a number to contact pt assistant; several unsuccessful attempts were made and voice messages were left. Pt instructions were provided to both pt and spouse. They were made aware to stop taking Aspirin, vitamins fish oil, and herbal medications such as Melatonin, Biotin, Acidophilus. Do not take any NSAIDs ie: Ibuprofen, Advil, Naproxen, BC and Goody Powder or any medication containing Aspirin.

## 2015-12-07 NOTE — Anesthesia Preprocedure Evaluation (Addendum)
Anesthesia Evaluation  Patient identified by MRN, date of birth, ID band Patient awake    Reviewed: Allergy & Precautions, H&P , NPO status , Patient's Chart, lab work & pertinent test results  Airway Mallampati: III  TM Distance: >3 FB Neck ROM: Full    Dental no notable dental hx. (+) Teeth Intact, Dental Advisory Given   Pulmonary neg pulmonary ROS, former smoker,    Pulmonary exam normal breath sounds clear to auscultation       Cardiovascular hypertension, Pt. on medications + CAD  Atrial Fibrillation  Rhythm:Regular Rate:Normal     Neuro/Psych  Headaches, Bipolar Disorder Schizophrenia    GI/Hepatic Neg liver ROS, GERD  Medicated and Controlled,  Endo/Other  Hypothyroidism   Renal/GU negative Renal ROS  negative genitourinary   Musculoskeletal  (+) Arthritis , Osteoarthritis,  Fibromyalgia -  Abdominal   Peds  Hematology negative hematology ROS (+)   Anesthesia Other Findings   Reproductive/Obstetrics negative OB ROS                          Anesthesia Physical Anesthesia Plan  ASA: III  Anesthesia Plan: General   Post-op Pain Management:    Induction: Intravenous  Airway Management Planned: LMA and Oral ETT  Additional Equipment:   Intra-op Plan:   Post-operative Plan: Extubation in OR  Informed Consent: I have reviewed the patients History and Physical, chart, labs and discussed the procedure including the risks, benefits and alternatives for the proposed anesthesia with the patient or authorized representative who has indicated his/her understanding and acceptance.   Dental advisory given  Plan Discussed with: CRNA  Anesthesia Plan Comments:        Anesthesia Quick Evaluation

## 2015-12-07 NOTE — Progress Notes (Signed)
Anesthesia Chart Review: SAME DAY WORK-UP.  Patient is a 82 year old female scheduled for left breast lumpectomy with radioactive seed localization on 12/08/15 by Dr. Marlou Starks. Radioactive seed implant was placed on 12/06/15.  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 Difficile, C7-T1 fracture dislocation s/p C5-T2 posterior fusion 10/12/13, T&A, appendectomy, cholecystectomy.   PCP is listed as Dr. Carolann Littler. Cardiologist is Dr. Percival Spanish. On 11/10/15 he wrote, "OK to hold the Xarelto x 2 days.  She is at acceptable risk for surgery." HEM-ONC is Dr. Burr Medico.  Meds include amlodipine, aspirin 81 mg, Lipitor, Bentyl, Aricept, Flonase, levothyroxine, Lidoderm, meclizine, melatonin, Namenda, mesalamine, fish oil, Mirapex, risperidone, Xarelto, tramadol. (Of note, when our PAT phone RN called to speak with patient, she was unable to answer when her last Xarelto dose was. She also seemed confused about her surgery date as did her husband. Sherlynn Stalls, RN has been in communication with Dr. Ethlyn Gallery staff. She is also trying to reach patient's aid who can hopefully confirm medication and preoperative instructions.)  04/11/15 EKG: NSR, possible LAE.  02/2015 30 day Event monitor: 1. NSR with PACs 2. AFIB with RVR 3. TBS 4. Needs ROV  02/04/15 Echo: Study Conclusions - Left ventricle: The cavity size was normal. Systolic function was   vigorous. The estimated ejection fraction was in the range of 65%   to 70%. Wall motion was normal; there were no regional wall   motion abnormalities. Doppler parameters are consistent with   abnormal left ventricular relaxation (grade 1  diastolic   dysfunction). There was no evidence of elevated ventricular   filling pressure by Doppler parameters. - Aortic valve: There was no regurgitation. - Aortic root: The aortic root was normal in size. - Mitral valve: Structurally normal valve. There was mild   regurgitation. - Left atrium: The atrium was mildly dilated. - Right ventricle: Systolic function was normal. - Right atrium: The atrium was normal in size. - Tricuspid valve: There was mild regurgitation. - Pulmonic valve: There was no regurgitation. - Pulmonary arteries: Systolic pressure was at the upper limits of   normal. PA peak pressure: 32 mm Hg (S). - Inferior vena cava: The vessel was normal in size. - Pericardium, extracardiac: There was no pericardial effusion.  10/03/04 Cardiac cath: 30% proximal LAD, otherwise normal coronaries. LVEF 65%.  04/11/15 1V CXR: FINDINGS: Normal heart size and aortic contours. Stable asymmetric prominence of the right hilum, especially when compared to 09/28/2014. There is no edema, consolidation, effusion, or pneumothorax. No effusion or pneumothorax. No acute osseous findings. IMPRESSION: Stable.  No evidence of active disease.  She will need labs on arrival since she is a same day work-up. Further evaluation by her surgeon and anesthesiologist on the day of surgery.  George Hugh [facility]/Anesthesiology Phone [phone] 12/07/2015 12:53 PM

## 2015-12-08 ENCOUNTER — Ambulatory Visit
Admission: RE | Admit: 2015-12-08 | Discharge: 2015-12-08 | Disposition: A | Discharge status: Home / Self Care | Payer: Medicare Other | Source: Ambulatory Visit | Attending: General Surgery | Admitting: General Surgery

## 2015-12-08 ENCOUNTER — Ambulatory Visit (HOSPITAL_COMMUNITY): Payer: Medicare Other | Admitting: Vascular Surgery

## 2015-12-08 ENCOUNTER — Encounter (HOSPITAL_COMMUNITY)
Admission: RE | Disposition: A | Discharge status: Home / Self Care | Payer: Self-pay | Source: Ambulatory Visit | Attending: General Surgery

## 2015-12-08 ENCOUNTER — Ambulatory Visit (HOSPITAL_COMMUNITY)
Admission: RE | Admit: 2015-12-08 | Discharge: 2015-12-08 | Disposition: A | Discharge status: Home / Self Care | Payer: Medicare Other | Source: Ambulatory Visit | Attending: General Surgery | Admitting: General Surgery

## 2015-12-08 ENCOUNTER — Encounter (HOSPITAL_COMMUNITY): Payer: Self-pay | Admitting: Certified Registered"

## 2015-12-08 DIAGNOSIS — Z6830 Body mass index (BMI) 30.0-30.9, adult: Secondary | ICD-10-CM | POA: Insufficient documentation

## 2015-12-08 DIAGNOSIS — F0390 Unspecified dementia without behavioral disturbance: Secondary | ICD-10-CM | POA: Diagnosis not present

## 2015-12-08 DIAGNOSIS — F209 Schizophrenia, unspecified: Secondary | ICD-10-CM | POA: Insufficient documentation

## 2015-12-08 DIAGNOSIS — E669 Obesity, unspecified: Secondary | ICD-10-CM | POA: Insufficient documentation

## 2015-12-08 DIAGNOSIS — C50512 Malignant neoplasm of lower-outer quadrant of left female breast: Secondary | ICD-10-CM | POA: Diagnosis not present

## 2015-12-08 DIAGNOSIS — Z170 Estrogen receptor positive status [ER+]: Secondary | ICD-10-CM | POA: Insufficient documentation

## 2015-12-08 DIAGNOSIS — I10 Essential (primary) hypertension: Secondary | ICD-10-CM | POA: Diagnosis not present

## 2015-12-08 DIAGNOSIS — Z7901 Long term (current) use of anticoagulants: Secondary | ICD-10-CM | POA: Insufficient documentation

## 2015-12-08 DIAGNOSIS — Z923 Personal history of irradiation: Secondary | ICD-10-CM | POA: Diagnosis not present

## 2015-12-08 DIAGNOSIS — K219 Gastro-esophageal reflux disease without esophagitis: Secondary | ICD-10-CM | POA: Diagnosis not present

## 2015-12-08 DIAGNOSIS — E785 Hyperlipidemia, unspecified: Secondary | ICD-10-CM | POA: Diagnosis not present

## 2015-12-08 DIAGNOSIS — G2581 Restless legs syndrome: Secondary | ICD-10-CM | POA: Diagnosis not present

## 2015-12-08 DIAGNOSIS — Z886 Allergy status to analgesic agent status: Secondary | ICD-10-CM | POA: Insufficient documentation

## 2015-12-08 DIAGNOSIS — Z8719 Personal history of other diseases of the digestive system: Secondary | ICD-10-CM | POA: Insufficient documentation

## 2015-12-08 DIAGNOSIS — N6012 Diffuse cystic mastopathy of left breast: Secondary | ICD-10-CM | POA: Insufficient documentation

## 2015-12-08 DIAGNOSIS — C50912 Malignant neoplasm of unspecified site of left female breast: Secondary | ICD-10-CM | POA: Diagnosis not present

## 2015-12-08 DIAGNOSIS — I2510 Atherosclerotic heart disease of native coronary artery without angina pectoris: Secondary | ICD-10-CM | POA: Insufficient documentation

## 2015-12-08 DIAGNOSIS — Z7982 Long term (current) use of aspirin: Secondary | ICD-10-CM | POA: Insufficient documentation

## 2015-12-08 DIAGNOSIS — M797 Fibromyalgia: Secondary | ICD-10-CM | POA: Insufficient documentation

## 2015-12-08 DIAGNOSIS — G4733 Obstructive sleep apnea (adult) (pediatric): Secondary | ICD-10-CM | POA: Insufficient documentation

## 2015-12-08 DIAGNOSIS — Z87891 Personal history of nicotine dependence: Secondary | ICD-10-CM | POA: Insufficient documentation

## 2015-12-08 DIAGNOSIS — Z885 Allergy status to narcotic agent status: Secondary | ICD-10-CM | POA: Insufficient documentation

## 2015-12-08 DIAGNOSIS — K589 Irritable bowel syndrome without diarrhea: Secondary | ICD-10-CM | POA: Diagnosis not present

## 2015-12-08 DIAGNOSIS — Z8611 Personal history of tuberculosis: Secondary | ICD-10-CM | POA: Diagnosis not present

## 2015-12-08 DIAGNOSIS — Z86010 Personal history of colonic polyps: Secondary | ICD-10-CM | POA: Insufficient documentation

## 2015-12-08 DIAGNOSIS — M1990 Unspecified osteoarthritis, unspecified site: Secondary | ICD-10-CM | POA: Insufficient documentation

## 2015-12-08 DIAGNOSIS — M8580 Other specified disorders of bone density and structure, unspecified site: Secondary | ICD-10-CM | POA: Diagnosis not present

## 2015-12-08 DIAGNOSIS — E039 Hypothyroidism, unspecified: Secondary | ICD-10-CM | POA: Insufficient documentation

## 2015-12-08 DIAGNOSIS — Z981 Arthrodesis status: Secondary | ICD-10-CM | POA: Insufficient documentation

## 2015-12-08 DIAGNOSIS — Z888 Allergy status to other drugs, medicaments and biological substances status: Secondary | ICD-10-CM | POA: Insufficient documentation

## 2015-12-08 DIAGNOSIS — Z882 Allergy status to sulfonamides status: Secondary | ICD-10-CM | POA: Insufficient documentation

## 2015-12-08 DIAGNOSIS — I081 Rheumatic disorders of both mitral and tricuspid valves: Secondary | ICD-10-CM | POA: Diagnosis not present

## 2015-12-08 DIAGNOSIS — F319 Bipolar disorder, unspecified: Secondary | ICD-10-CM | POA: Diagnosis not present

## 2015-12-08 HISTORY — DX: Respiratory tuberculosis unspecified: A15.9

## 2015-12-08 HISTORY — PX: BREAST LUMPECTOMY WITH RADIOACTIVE SEED LOCALIZATION: SHX6424

## 2015-12-08 LAB — CBC
HEMATOCRIT: 40.5 % (ref 36.0–46.0)
Hemoglobin: 12.8 g/dL (ref 12.0–15.0)
MCH: 27.4 pg (ref 26.0–34.0)
MCHC: 31.6 g/dL (ref 30.0–36.0)
MCV: 86.7 fL (ref 78.0–100.0)
Platelets: 180 10*3/uL (ref 150–400)
RBC: 4.67 MIL/uL (ref 3.87–5.11)
RDW: 15.4 % (ref 11.5–15.5)
WBC: 6.1 10*3/uL (ref 4.0–10.5)

## 2015-12-08 LAB — APTT: aPTT: 38 seconds — ABNORMAL HIGH (ref 24–36)

## 2015-12-08 LAB — PROTIME-INR
INR: 1.9
Prothrombin Time: 22.1 seconds — ABNORMAL HIGH (ref 11.4–15.2)

## 2015-12-08 LAB — BASIC METABOLIC PANEL
Anion gap: 12 (ref 5–15)
BUN: 11 mg/dL (ref 6–20)
CALCIUM: 9.6 mg/dL (ref 8.9–10.3)
CO2: 24 mmol/L (ref 22–32)
CREATININE: 0.82 mg/dL (ref 0.44–1.00)
Chloride: 105 mmol/L (ref 101–111)
GFR calc non Af Amer: >60 mL/min (ref >60)
GLUCOSE: 115 mg/dL — AB (ref 65–99)
Potassium: 3.9 mmol/L (ref 3.5–5.1)
Sodium: 141 mmol/L (ref 135–145)

## 2015-12-08 SURGERY — BREAST LUMPECTOMY WITH RADIOACTIVE SEED LOCALIZATION
Anesthesia: General | Laterality: Left

## 2015-12-08 MED ORDER — FENTANYL CITRATE (PF) 100 MCG/2ML IJ SOLN: 25.0000 ug | INTRAMUSCULAR | Status: DC | PRN

## 2015-12-08 MED ORDER — CEFAZOLIN SODIUM-DEXTROSE 2-4 GM/100ML-% IV SOLN
2.0000 g | INTRAVENOUS | Status: AC
  Administered 2015-12-08: 2 g via INTRAVENOUS
  Filled 2015-12-08: qty 100

## 2015-12-08 MED ORDER — LIDOCAINE 2% (20 MG/ML) 5 ML SYRINGE
INTRAMUSCULAR | Status: AC
  Filled 2015-12-08: qty 5

## 2015-12-08 MED ORDER — FENTANYL CITRATE (PF) 100 MCG/2ML IJ SOLN
INTRAMUSCULAR | Status: DC | PRN
  Administered 2015-12-08 (×2): 50 ug via INTRAVENOUS

## 2015-12-08 MED ORDER — CEFAZOLIN SODIUM-DEXTROSE 2-4 GM/100ML-% IV SOLN: 2.0000 g | INTRAVENOUS | Status: DC

## 2015-12-08 MED ORDER — HYDROCODONE-ACETAMINOPHEN 5-325 MG PO TABS: 1.0000 | ORAL_TABLET | ORAL | 0 refills | Status: DC | PRN

## 2015-12-08 MED ORDER — ONDANSETRON HCL 4 MG/2ML IJ SOLN
INTRAMUSCULAR | Status: AC
  Filled 2015-12-08: qty 2

## 2015-12-08 MED ORDER — CHLORHEXIDINE GLUCONATE CLOTH 2 % EX PADS: 6.0000 | MEDICATED_PAD | Freq: Once | CUTANEOUS | Status: DC

## 2015-12-08 MED ORDER — SODIUM CHLORIDE 0.9 % IJ SOLN
INTRAMUSCULAR | Status: AC
  Filled 2015-12-08: qty 10

## 2015-12-08 MED ORDER — METHYLENE BLUE 0.5 % INJ SOLN
INTRAVENOUS | Status: AC
  Filled 2015-12-08: qty 10

## 2015-12-08 MED ORDER — BUPIVACAINE-EPINEPHRINE (PF) 0.25% -1:200000 IJ SOLN
INTRAMUSCULAR | Status: AC
  Filled 2015-12-08: qty 30

## 2015-12-08 MED ORDER — MIDAZOLAM HCL 2 MG/2ML IJ SOLN
INTRAMUSCULAR | Status: AC
  Filled 2015-12-08: qty 2

## 2015-12-08 MED ORDER — 0.9 % SODIUM CHLORIDE (POUR BTL) OPTIME
TOPICAL | Status: DC | PRN
  Administered 2015-12-08: 1000 mL

## 2015-12-08 MED ORDER — DEXAMETHASONE SODIUM PHOSPHATE 10 MG/ML IJ SOLN
INTRAMUSCULAR | Status: DC | PRN
  Administered 2015-12-08: 4 mg via INTRAVENOUS

## 2015-12-08 MED ORDER — LACTATED RINGERS IV SOLN
INTRAVENOUS | Status: DC
  Administered 2015-12-08: 07:00:00 via INTRAVENOUS

## 2015-12-08 MED ORDER — ONDANSETRON HCL 4 MG/2ML IJ SOLN
INTRAMUSCULAR | Status: DC | PRN
  Administered 2015-12-08: 4 mg via INTRAVENOUS

## 2015-12-08 MED ORDER — LIDOCAINE 2% (20 MG/ML) 5 ML SYRINGE
INTRAMUSCULAR | Status: DC | PRN
  Administered 2015-12-08: 60 mg via INTRAVENOUS

## 2015-12-08 MED ORDER — DEXAMETHASONE SODIUM PHOSPHATE 10 MG/ML IJ SOLN
INTRAMUSCULAR | Status: AC
  Filled 2015-12-08: qty 1

## 2015-12-08 MED ORDER — FENTANYL CITRATE (PF) 100 MCG/2ML IJ SOLN
INTRAMUSCULAR | Status: AC
  Filled 2015-12-08: qty 2

## 2015-12-08 MED ORDER — PROPOFOL 10 MG/ML IV BOLUS
INTRAVENOUS | Status: AC
  Filled 2015-12-08: qty 20

## 2015-12-08 MED ORDER — BUPIVACAINE-EPINEPHRINE 0.25% -1:200000 IJ SOLN
INTRAMUSCULAR | Status: DC | PRN
  Administered 2015-12-08: 20 mL

## 2015-12-08 MED ORDER — PROPOFOL 10 MG/ML IV BOLUS
INTRAVENOUS | Status: DC | PRN
  Administered 2015-12-08: 80 mg via INTRAVENOUS
  Administered 2015-12-08: 20 mg via INTRAVENOUS

## 2015-12-08 SURGICAL SUPPLY — 38 items
APPLIER CLIP 9.375 MED OPEN (MISCELLANEOUS) ×3
BLADE SURG 15 STRL LF DISP TIS (BLADE) ×1 IMPLANT
BLADE SURG 15 STRL SS (BLADE) ×2
CANISTER SUCTION 2500CC (MISCELLANEOUS) ×3 IMPLANT
CHLORAPREP W/TINT 26ML (MISCELLANEOUS) ×3 IMPLANT
CLIP APPLIE 9.375 MED OPEN (MISCELLANEOUS) ×1 IMPLANT
COVER PROBE W GEL 5X96 (DRAPES) ×3 IMPLANT
COVER SURGICAL LIGHT HANDLE (MISCELLANEOUS) ×3 IMPLANT
DERMABOND ADVANCED (GAUZE/BANDAGES/DRESSINGS) ×2
DERMABOND ADVANCED .7 DNX12 (GAUZE/BANDAGES/DRESSINGS) ×1 IMPLANT
DEVICE DUBIN SPECIMEN MAMMOGRA (MISCELLANEOUS) ×3 IMPLANT
DRAPE CHEST BREAST 15X10 FENES (DRAPES) ×3 IMPLANT
DRAPE UTILITY XL STRL (DRAPES) ×3 IMPLANT
ELECT COATED BLADE 2.86 ST (ELECTRODE) ×3 IMPLANT
ELECT REM PT RETURN 9FT ADLT (ELECTROSURGICAL) ×3
ELECTRODE REM PT RTRN 9FT ADLT (ELECTROSURGICAL) ×1 IMPLANT
GLOVE BIO SURGEON STRL SZ7.5 (GLOVE) ×6 IMPLANT
GOWN STRL REUS W/ TWL LRG LVL3 (GOWN DISPOSABLE) ×2 IMPLANT
GOWN STRL REUS W/TWL LRG LVL3 (GOWN DISPOSABLE) ×4
KIT BASIN OR (CUSTOM PROCEDURE TRAY) ×3 IMPLANT
KIT MARKER MARGIN INK (KITS) ×3 IMPLANT
LIGHT WAVEGUIDE WIDE FLAT (MISCELLANEOUS) IMPLANT
NEEDLE 18GX1X1/2 (RX/OR ONLY) (NEEDLE) ×3 IMPLANT
NEEDLE FILTER BLUNT 18X 1/2SAF (NEEDLE) ×2
NEEDLE FILTER BLUNT 18X1 1/2 (NEEDLE) ×1 IMPLANT
NEEDLE HYPO 25GX1X1/2 BEV (NEEDLE) ×6 IMPLANT
NS IRRIG 1000ML POUR BTL (IV SOLUTION) ×3 IMPLANT
PACK SURGICAL SETUP 50X90 (CUSTOM PROCEDURE TRAY) ×3 IMPLANT
PENCIL BUTTON HOLSTER BLD 10FT (ELECTRODE) ×3 IMPLANT
SPONGE LAP 18X18 X RAY DECT (DISPOSABLE) ×3 IMPLANT
SUT MNCRL AB 4-0 PS2 18 (SUTURE) ×3 IMPLANT
SUT VIC AB 3-0 SH 18 (SUTURE) ×3 IMPLANT
SYR BULB 3OZ (MISCELLANEOUS) ×3 IMPLANT
SYR CONTROL 10ML LL (SYRINGE) ×6 IMPLANT
TOWEL OR 17X26 10 PK STRL BLUE (TOWEL DISPOSABLE) ×3 IMPLANT
TUBE CONNECTING 12'X1/4 (SUCTIONS) ×1
TUBE CONNECTING 12X1/4 (SUCTIONS) ×2 IMPLANT
YANKAUER SUCT BULB TIP NO VENT (SUCTIONS) ×3 IMPLANT

## 2015-12-08 NOTE — Interval H&P Note (Signed)
History and Physical Interval Note:  12/08/2015 7:25 AM  [name]  has presented today for surgery, with the diagnosis of LEFT BREAST CANCER  The various methods of treatment have been discussed with the patient and family. After consideration of risks, benefits and other options for treatment, the patient has consented to  Procedure(s): BREAST LUMPECTOMY WITH RADIOACTIVE SEED LOCALIZATION (Left) as a surgical intervention .  The patient's history has been reviewed, patient examined, no change in status, stable for surgery.  I have reviewed the patient's chart and labs.  Questions were answered to the patient's satisfaction.     TOTH III,PAUL S

## 2015-12-08 NOTE — Op Note (Signed)
12/08/2015  8:35 AM  PATIENT:  [name]  82 y.o. female  PRE-OPERATIVE DIAGNOSIS:  LEFT BREAST CANCER  POST-OPERATIVE DIAGNOSIS:  LEFT BREAST CANCER  PROCEDURE:  Procedure(s): BREAST LUMPECTOMY WITH RADIOACTIVE SEED LOCALIZATION (Left)  SURGEON:  Surgeon(s) and Role:    * [name], MD - Primary  PHYSICIAN ASSISTANT:   ASSISTANTS: none   ANESTHESIA:   general  EBL:  Total I/O In: 500 [I.V.:500] Out: 10 [Blood:10]  BLOOD ADMINISTERED:none  DRAINS: none   LOCAL MEDICATIONS USED:  MARCAINE     SPECIMEN:  Source of Specimen:  left breast tissue with additional deep, medial, and inferior margins  DISPOSITION OF SPECIMEN:  PATHOLOGY  COUNTS:  YES  TOURNIQUET:  * No tourniquets in log *  DICTATION: .Dragon Dictation   After informed consent was obtained the patient was brought to the operating room and placed in the supine position on the operating room table. After adequate induction of general anesthesia the patient's left breast was prepped with ChloraPrep, allowed to dry, and draped in usual sterile manner. An appropriate timeout was performed. Previously an I-125 seed was placed in the lower outer quadrant of the left breast to mark an area of invasive breast cancer. The neoprobe was set to I-125 in the area of radioactivity was readily identified. A radial type incision was made with 15 blade knife overlying the area of radioactivity. The incision was carried through the skin and subcutaneous tissue sharply with the electrocautery. While checking the area of radioactivity frequently a circular portion of breast tissue was excised sharply around the radioactive seed. Once the specimen was removed it was oriented with the appropriate paint colors. A specimen radiograph was obtained that showed the clip and seed to be near the inferior and medial edge of the specimen. Additional inferior, medial, and deep margins were taken from this area and marked appropriately. All of  this tissue was sent to pathology for further evaluation. Hemostasis was achieved using the Bovie electrocautery. The cavity was marked with clips. The wound was irrigated with saline and infiltrated with quarter percent Marcaine. The deep layer of the wound was then closed with layers of interrupted 3-0 Vicryl stitches. The skin was then closed with interrupted 4-0 Monocryl subcuticular stitches. Dressings were applied. The patient tolerated the procedure well. At the end of the case all needle sponge and instrument counts were correct. The patient was then awakened and taken recovery in stable condition.  PLAN OF CARE: Discharge to home after PACU  PATIENT DISPOSITION:  PACU - hemodynamically stable.   Delay start of Pharmacological VTE agent (>24hrs) due to surgical blood loss or risk of bleeding: not applicable

## 2015-12-08 NOTE — Anesthesia Procedure Notes (Signed)
Procedure Name: LMA Insertion Date/Time: 12/08/2015 7:40 AM Performed by: Melina Copa, Evamaria Detore R Pre-anesthesia Checklist: Patient identified, Emergency Drugs available, Suction available and Patient being monitored Patient Re-evaluated:Patient Re-evaluated prior to inductionOxygen Delivery Method: Circle System Utilized Preoxygenation: Pre-oxygenation with 100% oxygen Intubation Type: IV induction Ventilation: Mask ventilation without difficulty LMA: LMA inserted LMA Size: 4.0 Number of attempts: 1 Placement Confirmation: positive ETCO2 Tube secured with: Tape Dental Injury: Teeth and Oropharynx as per pre-operative assessment

## 2015-12-08 NOTE — Transfer of Care (Signed)
Immediate Anesthesia Transfer of Care Note  Patient: [name]  Procedure(s) Performed: Procedure(s): BREAST LUMPECTOMY WITH RADIOACTIVE SEED LOCALIZATION (Left)  Patient Location: PACU  Anesthesia Type:General  Level of Consciousness: awake, oriented and patient cooperative  Airway & Oxygen Therapy: Patient Spontanous Breathing and Patient connected to nasal cannula oxygen  Post-op Assessment: Report given to RN, Post -op Vital signs reviewed and stable and Patient moving all extremities  Post vital signs: Reviewed and stable  Last Vitals:  Vitals:   12/08/15 0648  BP: (!) 176/67  Pulse: 93  Resp: 16  Temp: 36.4 C    Last Pain:  Vitals:   12/08/15 0648  TempSrc: Oral         Complications: No apparent anesthesia complications

## 2015-12-08 NOTE — Progress Notes (Signed)
OFFICES -  Please note.  All the other 3 phone numbers are invalid.  The 512 may be from [place] - dtr? But current phone number as given by the husband [phone]

## 2015-12-08 NOTE — Anesthesia Postprocedure Evaluation (Signed)
Anesthesia Post Note  Patient: [name]  Procedure(s) Performed: Procedure(s) (LRB): BREAST LUMPECTOMY WITH RADIOACTIVE SEED LOCALIZATION (Left)  Patient location during evaluation: PACU Anesthesia Type: General Level of consciousness: awake and alert Pain management: pain level controlled Vital Signs Assessment: post-procedure vital signs reviewed and stable Respiratory status: spontaneous breathing, nonlabored ventilation and respiratory function stable Cardiovascular status: blood pressure returned to baseline and stable Postop Assessment: no signs of nausea or vomiting Anesthetic complications: no    Last Vitals:  Vitals:   12/08/15 0912 12/08/15 0929  BP: (!) 147/58 (!) 167/78  Pulse: 85 (!) 103  Resp: 16   Temp: 36.5 C     Last Pain:  Vitals:   12/08/15 0648  TempSrc: Oral                 Dickey Caamano,W. EDMOND

## 2015-12-08 NOTE — H&P (Signed)
[name] is an 82 y.o. female.   Chief Complaint: breast cancer HPI: The patient was recently found to have a small cancer in the lower outer left breast. Her nodes were clinically neg. She has significant dementia  Past Medical History:  Diagnosis Date  . Bipolar affective ([place])    History of psychosis  . C. difficile diarrhea   . CAD (coronary artery disease)    LAD 30% 2006  . Cerebrovascular disease   . Colonic polyp   . Crohn's disease ([place])   . Diverticulosis of colon   . Fibromyalgia   . GERD (gastroesophageal reflux disease)   . History of atrial flutter   . History of bronchitis   . History of headache   . History of hyperparathyroidism   . History of tuberculosis   . Hyperlipemia   . Hypertension   . Hypothyroidism   . IBS (irritable bowel syndrome)   . Memory disturbance   . 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 Obesity   . OSA (obstructive sleep apnea)    denies 12/07/15  . Osteopenia   . Other diseases of lung, not elsewhere classified   . Psychosis   . Radiation 05/2008   5000 cGy to left lower eyelid, parotid lymphatics and upper neck  . Restless legs syndrome (RLS)   . Syncope   . Tuberculosis     Past Surgical History:  Procedure Laterality Date  . APPENDECTOMY    . BLADDER SUSPENSION    . CATARACT EXTRACTION Bilateral   . CHOLECYSTECTOMY    . eye lid surgery  04/04/2011   [facility] by Dr. Norlene Duel  . HEEL SPUR EXCISION    . INCISION AND DRAINAGE PERIRECTAL ABSCESS    . LUMBAR LAMINECTOMY    . Moh';s surg left lower eye lid surgery and lid reconstruction surgery  2/10  . NASAL SINUS SURGERY Right    Right maxillary  . Parathyroid adenoma surgery    . POSTERIOR CERVICAL FUSION/FORAMINOTOMY N/A 10/12/2013   Procedure: Cervical five-Thoracic two cervico-thoracic posterior fusion;  Surgeon: [name], MD;  Location: [place] [facility];  Service: Neurosurgery;  Laterality: N/A;  . TONSILLECTOMY AND ADENOIDECTOMY      Family History  Problem  Relation Age of Onset  . Heart failure Father   . Arthritis Mother   . Hypertension Mother   . Ulcers Mother   . Colon cancer Maternal Grandmother   . Pulmonary embolism     Social History:  reports that she quit smoking about 55 years ago. Her smoking use included Cigarettes. She quit after 6.00 years of use. She has never used smokeless tobacco. She reports that she does not drink alcohol or use drugs.  Allergies:  Allergies  Allergen Reactions  . Prednisone Other (See Comments)    Patient had psychiatric episode with hallucinations. Caused 3.5 week long hospitalization & loss of memory   . Carbamazepine Nausea Only and Other (See Comments)    Tegretol; dizziness, blurred vision, nausea, headache, insomnia  . Codeine Nausea And Vomiting  . Demerol [Meperidine] Nausea And Vomiting  . Diclofenac Sodium Other (See Comments)    severe headache  . Fentanyl Nausea And Vomiting and Other (See Comments)    IV Fentanyl; caused nausea and vomiting Fentanyl patch; caused chest pain, HBP, with second patch experienced vertigo and nausea  . Fluoxetine Hcl Nausea Only and Other (See Comments)    dizziness, blurred vision, nausea, headache, insomnia  . Gentamicin  Other reaction(s): EYE IRRITATION  . Hydromorphone Hcl Nausea And Vomiting  . Ibuprofen Other (See Comments)    Caused ulcers and colitis  . Indomethacin Nausea Only and Other (See Comments)    dizziness, blurred vision, nausea, headache, insomnia  . Lisinopril Cough  . Meperidine Hcl Nausea And Vomiting  . Morphine Nausea And Vomiting  . Pregabalin Other (See Comments)    severe restless legs, insomnia  . Propranolol Hcl Nausea Only and Other (See Comments)    dizziness, blurred vision, nausea, headache, insomnia  . Refresh Lacri-Lube [Artificial Tears] Swelling    Caused swelling, redness, hard crust on eyelids   . Ropinirole Hcl Other (See Comments)    severe restless legs and insomnia  . Sulfamethoxazole Nausea Only  and Other (See Comments)    gas, loose stools  . White Petrolatum-Mineral Oil  [Aquaphilic]     Other reaction(s): SWELLING/EDEMA  . Bacitracin Hives, Itching and Rash    redness    Medications Prior to Admission  Medication Sig Dispense Refill  . amLODipine (NORVASC) 5 MG tablet Take 1 tablet (5 mg total) by mouth daily. 90 tablet 3  . aspirin 81 MG EC tablet Take 81 mg by mouth.    Marland Kitchen atorvastatin (LIPITOR) 40 MG tablet Take 1 tablet (40 mg total) by mouth daily. 90 tablet 1  . Biotin 10 MG TABS Take 10 mg by mouth.    . Calcium-Magnesium 500-250 MG TABS Take 2 tablets by mouth daily at 6 PM. 60 each 11  . Cholecalciferol (VITAMIN D3) 2000 units TABS Take 2,000 Units by mouth daily at 12 noon. 30 tablet 11  . cholestyramine (QUESTRAN) 4 GM/DOSE powder Take by mouth.    . dicyclomine (BENTYL) 10 MG capsule Take 1 capsule (10 mg total) by mouth 3 (three) times daily before meals. 20 capsule 0  . donepezil (ARICEPT) 5 MG tablet TK 2 T PO HS  5  . fluticasone (FLONASE) 50 MCG/ACT nasal spray Place 2 sprays into both nostrils at bedtime. 16 g 2  . Lactobacillus (ACIDOPHILUS) TABS Take by mouth.    . levothyroxine (SYNTHROID, LEVOTHROID) 88 MCG tablet Take 1 tablet (88 mcg total) by mouth daily before breakfast. 30 tablet 11  . lidocaine (LIDODERM) 5 % Frequency:PRN   Dosage:0.0     Instructions:  Note:Dose: 1    . loperamide (IMODIUM) 2 MG capsule Take 2 mg by mouth.    . meclizine (ANTIVERT) 25 MG tablet Take 25 mg by mouth.    . Melatonin 10 MG TABS Take 10 mg by mouth 1 day or 1 dose. (Patient taking differently: Take 10 mg by mouth at bedtime. ) 30 tablet 5  . memantine (NAMENDA) 10 MG tablet TAKE 1 TABLET (10 MG TOTAL) BY MOUTH TWO (TWO) TIMES DAILY. 180 tablet 2  . mesalamine (LIALDA) 1.2 g EC tablet Take 3 tablets (3.6 g total) by mouth daily with lunch. 90 tablet 5  . Omega-3 Fatty Acids (FISH OIL PO) Take by mouth.    . pramipexole (MIRAPEX) 0.5 MG tablet TAKE 1 TABLET TWICE A DAY  (NEED OFFICE VISIT) 180 tablet 0  . Propylene Glycol (SYSTANE BALANCE) 0.6 % SOLN Place 1 drop into both eyes daily.    . risperiDONE (RISPERDAL) 0.5 MG tablet Take 1 tablet (0.5 mg total) by mouth at bedtime. 90 tablet 3  . rivaroxaban (XARELTO) 20 MG TABS tablet Take 1 tablet (20 mg total) by mouth daily with supper. 90 tablet 2  . Simethicone 180  MG CAPS Take 180 mg by mouth.    . traMADol (ULTRAM) 50 MG tablet Take 50 mg by mouth every 6 (six) hours as needed for moderate pain or severe pain.    . vitamin C (ASCORBIC ACID) 500 MG tablet Frequency:QD   Dosage:0.0     Instructions:  Note:Dose: 1      Results for orders placed or performed during the hospital encounter of 12/08/15 (from the past 48 hour(s))  CBC     Status: None   Collection Time: 12/08/15  6:45 AM  Result Value Ref Range   WBC 6.1 4.0 - 10.5 K/uL   RBC 4.67 3.87 - 5.11 MIL/uL   Hemoglobin 12.8 12.0 - 15.0 g/dL   HCT 40.5 36.0 - 46.0 %   MCV 86.7 78.0 - 100.0 fL   MCH 27.4 26.0 - 34.0 pg   MCHC 31.6 30.0 - 36.0 g/dL   RDW 15.4 11.5 - 15.5 %   Platelets 180 150 - 400 K/uL   Mm Lt Radioactive Seed Loc Mammo Guide  Result Date: 12/06/2015 CLINICAL DATA:  Biopsy proven invasive ductal carcinoma in the left breast. EXAM: MAMMOGRAPHIC GUIDED RADIOACTIVE SEED LOCALIZATION OF THE LEFT BREAST COMPARISON:  Previous exam(s). FINDINGS: Patient presents for radioactive seed localization prior to surgery. I met with the patient and we discussed the procedure of seed localization including benefits and alternatives. We discussed the high likelihood of a successful procedure. We discussed the risks of the procedure including infection, bleeding, tissue injury and further surgery. We discussed the low dose of radioactivity involved in the procedure. Informed, written consent was given. The usual time-out protocol was performed immediately prior to the procedure. Using mammographic guidance, sterile technique, 1% lidocaine and an I-125  radioactive seed, the ribbon shaped clip was localized using a lateral to medial approach. The follow-up mammogram images confirm the seed in the expected location and were marked for Dr. Marlou Starks. Follow-up survey of the patient confirms presence of the radioactive seed. Order number of I-125 seed:  [id number]. Total activity:  2.637 millicuries  Reference Date: 11/22/2015 The patient tolerated the procedure well and was released from the [facility]. She was given instructions regarding seed removal. IMPRESSION: Radioactive seed localization left breast. No apparent complications. Electronically Signed   By: Lillia Mountain M.D.   On: 12/06/2015 14:18    Review of Systems  Constitutional: Negative.   HENT: Negative.   Eyes: Positive for blurred vision.  Respiratory: Negative.   Cardiovascular: Negative.   Gastrointestinal: Negative.   Genitourinary: Negative.   Musculoskeletal: Negative.   Skin: Negative.   Neurological: Negative.   Endo/Heme/Allergies: Negative.   Psychiatric/Behavioral: Positive for memory loss.    Blood pressure (!) 176/67, pulse 93, temperature 97.6 F (36.4 C), temperature source Oral, resp. rate 16, weight 80.7 kg (178 lb), SpO2 98 %. Physical Exam  Constitutional: She is oriented to person, place, and time. She appears well-developed and well-nourished.  HENT:  Head: Normocephalic and atraumatic.  Eyes: Conjunctivae and EOM are normal. Pupils are equal, round, and reactive to light.  Neck: Normal range of motion. Neck supple.  Cardiovascular: Normal rate, regular rhythm and normal heart sounds.   Respiratory: Effort normal and breath sounds normal.  There is no palpable mass in either breast. There is no palpable axillary, supraclavicular, or cervical lymphadenopathy  GI: Soft. Bowel sounds are normal.  Musculoskeletal: Normal range of motion.  Neurological: She is alert and oriented to person, place, and time.  Skin: Skin is warm  and dry.  Psychiatric: She has a  normal mood and affect.  She has significant dementia     Assessment/Plan The patient has a small stage I cancer in the lower outer left breast. She has elected for breast conservation. I have discussed with her in detail the risks and benefits of the surgery as well as some of the technical aspects and she understands and wishes to proceed  Merrie Roof, MD 12/08/2015, 7:22 AM

## 2015-12-09 ENCOUNTER — Encounter (HOSPITAL_COMMUNITY): Payer: Self-pay | Admitting: General Surgery

## 2015-12-29 DIAGNOSIS — F0633 Mood disorder due to known physiological condition with manic features: Secondary | ICD-10-CM | POA: Diagnosis not present

## 2016-01-09 ENCOUNTER — Other Ambulatory Visit: Payer: Self-pay | Admitting: Family Medicine

## 2016-01-09 ENCOUNTER — Other Ambulatory Visit: Payer: Self-pay | Admitting: Emergency Medicine

## 2016-01-09 ENCOUNTER — Telehealth: Payer: Self-pay | Admitting: Family Medicine

## 2016-01-09 MED ORDER — MESALAMINE 1.2 G PO TBEC: 3.6000 g | DELAYED_RELEASE_TABLET | Freq: Every day | ORAL | 2 refills | Status: DC

## 2016-01-09 MED ORDER — LEVOTHYROXINE SODIUM 88 MCG PO TABS: 88.0000 ug | ORAL_TABLET | Freq: Every day | ORAL | 2 refills | Status: DC

## 2016-01-09 MED ORDER — LEVOTHYROXINE SODIUM 88 MCG PO TABS: 88.0000 ug | ORAL_TABLET | Freq: Every day | ORAL | 2 refills | Status: AC

## 2016-01-09 NOTE — Telephone Encounter (Signed)
[name] with [facility] calling to request refills for  levothyroxine (SYNTHROID, LEVOTHROID) 88 MCG tablet mesalamine (LIALDA) 1.2 g EC tablet 90 day  [facility]  This was sent to local pharmacy the last time, but they need sent to [facility]. Need asap. Pt almost out.

## 2016-01-09 NOTE — Telephone Encounter (Signed)
Spoke with patient to inform her that medications have been sent to pharmacy and scheduled a follow up appointment also

## 2016-01-09 NOTE — Telephone Encounter (Signed)
Refills OK for 3 months.   She will need office follow up by March.

## 2016-01-09 NOTE — Telephone Encounter (Signed)
Is it okay to refill these medications 

## 2016-01-10 ENCOUNTER — Encounter: Payer: Medicare Other | Admitting: Hematology

## 2016-01-10 ENCOUNTER — Telehealth: Payer: Self-pay | Admitting: Hematology

## 2016-01-10 NOTE — Progress Notes (Signed)
This encounter was created in error - please disregard.

## 2016-01-10 NOTE — Telephone Encounter (Signed)
Patient's aide reschedule today's f/u to 1/22 due to they cannot wait - per aide they came in at wrong time for appointments. Aide given new appointment date/time and desk nurse informed.

## 2016-01-26 NOTE — Progress Notes (Signed)
[place]  [phone] [phone]  Clinic Follow Up Note   Patient Care Team: [name], MD as PCP - General (Family Medicine) [name], MD as Consulting Physician (Hematology) Autumn Messing III, MD as Consulting Physician (General Surgery) 01/29/2016   CHIEF COMPLAINTS:  Left breast cancer   Oncology History   Breast cancer of lower-outer quadrant of left female breast [facility])   Staging form: Breast, AJCC 7th Edition   - Clinical stage from 10/31/2015: Stage IA (T1c, N0, M0) - Signed by [name], MD on 11/19/2015      Breast cancer of lower-outer quadrant of left female breast ([place])   10/23/2015 Mammogram    Diagnostic mammo and [place] shpwed left breast 4:00 complex cystic mass, 4:30 area of shadowing measuring 1.0X1.1X1.2cm      10/31/2015 Initial Diagnosis    Breast cancer of lower-outer quadrant of left female breast ([place])      10/31/2015 Initial Biopsy    Left breast core needle biopsy at 4:30 showed invasive ductal carcinoma and DCIS, , G1-2, 4:00 position biopsy was negative for malignant cells       10/31/2015 Receptors her2    ER 100%+, PR 90%+, HER2-, Ki67 15%      12/08/2015 Pathology Results    Left lumpectomy 12/08/15 Marlou Starks) 1. Breast, lumpectomy, Left - INVASIVE AND IN SITU DUCTAL CARCINOMA, 1.3 CM. - MARGINS NOT INVOLVED. - INVASIVE CARCINOMA FOCALLY 0.1 CM FROM LATERAL MARGIN. - PREVIOUS BIOPSY SITE. - FIBROCYSTIC CHANGES.        HISTORY OF PRESENTING ILLNESS:  [name] 83 y.o. female with multiple medical comorbidities, including Dementia and bipolar, is here because of her recently diagnosed left breast cancer. She is accompanied by her husband, and her daughter to the clinic today. She was referred by her breast surgeon Dr. Marlou Starks.  This was discovered by screening mammogram, which showed a complex cystic mass at 4:00, and an area of shadowing measuring 1.2 cm at this for 30 o'clock position of left breast,  both lesions were biopsied and the 4:30 o'clock lesion showed DCIS and invasive ductal carcinoma, ER/PR stripe positive, HER-2 negative, Ki-67 15%.  She had prior left breast biopsy for benign lesion, no family history of breast cancer or other malignancy.  She lives with her husband in an independent living facility, she is able to take care of herself, both patient and her husband have dementia, they have a caregiver, who stays with them 8 hours a day.  CURRENT THERAPY: Pending adjuvant Anastrozole 1 mg daily  INTERVAL HISTORY: [name] returns for follow up. The patient states she has recovered well from surgery and does not have pain. She does not remember is she has seen her surgeon, Dr. Marlou Starks, after surgery. The patient presents with her caregiver who helps clothe and bathe her. The caregiver states the patient is able to feed herself. The patient denies arthritis or join pain and the caregiver confirms this. The patient lives with her husband who is also has altered mental status.  MEDICAL HISTORY:  Past Medical History:  Diagnosis Date  . Bipolar affective ([place])    History of psychosis  . C. difficile diarrhea   . CAD (coronary artery disease)    LAD 30% 2006  . Cerebrovascular disease   . Colonic polyp   . Crohn's disease ([place])   . Diverticulosis of colon   . Fibromyalgia   . GERD (gastroesophageal reflux disease)   . History of atrial flutter   .  History of bronchitis   . History of headache   . History of hyperparathyroidism   . History of tuberculosis   . Hyperlipemia   . Hypertension   . Hypothyroidism   . IBS (irritable bowel syndrome)   . Memory disturbance   . 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 Obesity   . OSA (obstructive sleep apnea)    denies 12/07/15  . Osteopenia   . Other diseases of lung, not elsewhere classified   . Psychosis   . Radiation 05/2008   5000 cGy to left lower eyelid, parotid lymphatics and upper neck  . Restless legs syndrome  (RLS)   . Syncope   . Tuberculosis     SURGICAL HISTORY: Past Surgical History:  Procedure Laterality Date  . APPENDECTOMY    . BLADDER SUSPENSION    . BREAST LUMPECTOMY WITH RADIOACTIVE SEED LOCALIZATION Left 12/08/2015   Procedure: BREAST LUMPECTOMY WITH RADIOACTIVE SEED LOCALIZATION;  Surgeon: Autumn Messing III, MD;  Location: [place];  Service: General;  Laterality: Left;  . CATARACT EXTRACTION Bilateral   . CHOLECYSTECTOMY    . eye lid surgery  04/04/2011   [facility] by Dr. Norlene Duel  . HEEL SPUR EXCISION    . INCISION AND DRAINAGE PERIRECTAL ABSCESS    . LUMBAR LAMINECTOMY    . Moh';s surg left lower eye lid surgery and lid reconstruction surgery  2/10  . NASAL SINUS SURGERY Right    Right maxillary  . Parathyroid adenoma surgery    . POSTERIOR CERVICAL FUSION/FORAMINOTOMY N/A 10/12/2013   Procedure: Cervical five-Thoracic two cervico-thoracic posterior fusion;  Surgeon: [name], MD;  Location: [place] [facility];  Service: Neurosurgery;  Laterality: N/A;  . TONSILLECTOMY AND ADENOIDECTOMY      SOCIAL HISTORY: Social History   Social History  . Marital status: Married    Spouse name: Barnabas Lister  . Number of children: 2  . Years of education: N/A   Occupational History  .  Retired   Social History Main Topics  . Smoking status: Former Smoker    Years: 6.00    Types: Cigarettes    Quit date: 01/08/1960  . Smokeless tobacco: Never Used  . Alcohol use No  . Drug use: No  . Sexual activity: Not on file   Other Topics Concern  . Not on file   Social History Narrative   Patient is married with 2 children   Patient is right handed   Patient is a retired Marine scientist   Patient does not drink any caffeine.    FAMILY HISTORY: Family History  Problem Relation Age of Onset  . Heart failure Father   . Arthritis Mother   . Hypertension Mother   . Ulcers Mother   . Colon cancer Maternal Grandmother   . Pulmonary embolism      ALLERGIES:  is allergic to prednisone; carbamazepine;  codeine; demerol [meperidine]; diclofenac sodium; fentanyl; fluoxetine hcl; gentamicin; hydromorphone hcl; ibuprofen; indomethacin; lisinopril; meperidine hcl; morphine; pregabalin; propranolol hcl; refresh lacri-lube [artificial tears]; ropinirole hcl; sulfamethoxazole; white petrolatum-mineral oil  [aquaphilic]; and bacitracin.  MEDICATIONS:  Current Outpatient Prescriptions  Medication Sig Dispense Refill  . amLODipine (NORVASC) 5 MG tablet Take 1 tablet (5 mg total) by mouth daily. 90 tablet 3  . aspirin 81 MG EC tablet Take 81 mg by mouth.    Marland Kitchen atorvastatin (LIPITOR) 40 MG tablet Take 1 tablet (40 mg total) by mouth daily. 90 tablet 1  . Biotin 10 MG TABS Take 10  mg by mouth.    . Calcium-Magnesium 500-250 MG TABS Take 2 tablets by mouth daily at 6 PM. 60 each 11  . Cholecalciferol (VITAMIN D3) 2000 units TABS Take 2,000 Units by mouth daily at 12 noon. 30 tablet 11  . cholestyramine (QUESTRAN) 4 GM/DOSE powder Take by mouth.    . dicyclomine (BENTYL) 10 MG capsule Take 1 capsule (10 mg total) by mouth 3 (three) times daily before meals. 20 capsule 0  . donepezil (ARICEPT) 5 MG tablet TK 2 T PO HS  5  . fluticasone (FLONASE) 50 MCG/ACT nasal spray Place 2 sprays into both nostrils at bedtime. 16 g 2  . Lactobacillus (ACIDOPHILUS) TABS Take by mouth.    . levothyroxine (SYNTHROID, LEVOTHROID) 88 MCG tablet Take 1 tablet (88 mcg total) by mouth daily before breakfast. Pt needs an appointment for further refills 30 tablet 2  . lidocaine (LIDODERM) 5 % Frequency:PRN   Dosage:0.0     Instructions:  Note:Dose: 1    . loperamide (IMODIUM) 2 MG capsule Take 2 mg by mouth.    . meclizine (ANTIVERT) 25 MG tablet Take 25 mg by mouth.    . Melatonin 10 MG TABS Take 10 mg by mouth 1 day or 1 dose. (Patient taking differently: Take 10 mg by mouth at bedtime. ) 30 tablet 5  . memantine (NAMENDA) 10 MG tablet TAKE 1 TABLET (10 MG TOTAL) BY MOUTH TWO (TWO) TIMES DAILY. 180 tablet 2  . mesalamine (LIALDA)  1.2 g EC tablet Take 3 tablets (3.6 g total) by mouth daily with lunch. 90 tablet 2  . Omega-3 Fatty Acids (FISH OIL PO) Take by mouth.    . pramipexole (MIRAPEX) 0.5 MG tablet TAKE 1 TABLET TWICE A DAY (NEED OFFICE VISIT) 180 tablet 0  . Propylene Glycol (SYSTANE BALANCE) 0.6 % SOLN Place 1 drop into both eyes daily.    . risperiDONE (RISPERDAL) 0.5 MG tablet Take 1 tablet (0.5 mg total) by mouth at bedtime. 90 tablet 3  . rivaroxaban (XARELTO) 20 MG TABS tablet Take 1 tablet (20 mg total) by mouth daily with supper. 90 tablet 2  . Simethicone 180 MG CAPS Take 180 mg by mouth.    . traMADol (ULTRAM) 50 MG tablet Take 50 mg by mouth every 6 (six) hours as needed for moderate pain or severe pain.    . vitamin C (ASCORBIC ACID) 500 MG tablet Frequency:QD   Dosage:0.0     Instructions:  Note:Dose: 1    . anastrozole (ARIMIDEX) 1 MG tablet Take 1 tablet (1 mg total) by mouth daily. 30 tablet 2  . HYDROcodone-acetaminophen (NORCO/VICODIN) 5-325 MG tablet Take 1-2 tablets by mouth every 4 (four) hours as needed for moderate pain or severe pain. (Patient not taking: Reported on 01/29/2016) 20 tablet 0   No current facility-administered medications for this visit.     REVIEW OF SYSTEMS:   Constitutional: Denies fevers, chills or abnormal night sweats Eyes: Denies blurriness of vision, double vision or watery eyes Ears, nose, mouth, throat, and face: Denies mucositis or sore throat Respiratory: Denies cough, dyspnea or wheezes Cardiovascular: Denies palpitation, chest discomfort or lower extremity swelling Gastrointestinal:  Denies nausea, heartburn or change in bowel habits Skin: Denies abnormal skin rashes Lymphatics: Denies new lymphadenopathy or easy bruising Neurological:Denies numbness, tingling or new weaknesses Behavioral/Psych: Mood is stable, no new changes  All other systems were reviewed with the patient and are negative.  PHYSICAL EXAMINATION: ECOG PERFORMANCE STATUS: 2 - Symptomatic,  <50%  confined to bed  Vitals:   01/29/16 1025  BP: (!) 110/58  Pulse: 91  Resp: 18  Temp: 98.1 F (36.7 C)   Filed Weights   01/29/16 1025  Weight: 169 lb 12.8 oz (77 kg)    GENERAL:alert, no distress and comfortable SKIN: skin color, texture, turgor are normal, no rashes or significant lesions EYES: normal, conjunctiva are pink and non-injected, sclera clear OROPHARYNX:no exudate, no erythema and lips, buccal mucosa, and tongue normal  NECK: supple, thyroid normal size, non-tender, without nodularity LYMPH:  no palpable lymphadenopathy in the cervical, axillary or inguinal LUNGS: clear to auscultation and percussion with normal breathing effort HEART: regular rate & rhythm and no murmurs and no lower extremity edema ABDOMEN:abdomen soft, non-tender and normal bowel sounds Musculoskeletal:no cyanosis of digits and no clubbing (+) Ambulatory with a walker. PSYCH: alert & oriented x 3 with fluent speech NEURO: no focal motor/sensory deficits BREAST: She has a surgical incision in the LOQ of the left breast that has healed well. No discharge or skin erythema. No palpable masses in either breast. No nipple discharge or bleeding noted in the bilateral breasts.  LABORATORY DATA:  I have reviewed the data as listed CBC Latest Ref Rng & Units 12/08/2015 04/11/2015 03/12/2015  WBC 4.0 - 10.5 K/uL 6.1 8.2 4.8  Hemoglobin 12.0 - 15.0 g/dL 12.8 13.9 13.6  Hematocrit 36.0 - 46.0 % 40.5 40.7 42.8  Platelets 150 - 400 K/uL 180 213 191   CMP Latest Ref Rng & Units 12/08/2015 04/11/2015 03/12/2015  Glucose 65 - 99 mg/dL 115(H) 119(H) 113(H)  BUN 6 - 20 mg/dL 11 25(H) 18  Creatinine 0.44 - 1.00 mg/dL 0.82 0.79 0.80  Sodium 135 - 145 mmol/L 141 140 141  Potassium 3.5 - 5.1 mmol/L 3.9 4.2 3.9  Chloride 101 - 111 mmol/L 105 106 105  CO2 22 - 32 mmol/L 24 23 27   Calcium 8.9 - 10.3 mg/dL 9.6 9.6 9.5  Total Protein 6.5 - 8.1 g/dL - 8.0 7.2  Total Bilirubin 0.3 - 1.2 mg/dL - 0.7 0.7  Alkaline Phos 38 -  126 U/L - 68 67  AST 15 - 41 U/L - 33 26  ALT 14 - 54 U/L - 23 20   Pathology report  Diagnosis 12/08/2015 1. Breast, lumpectomy, Left - INVASIVE AND IN SITU DUCTAL CARCINOMA, 1.3 CM. - MARGINS NOT INVOLVED. - INVASIVE CARCINOMA FOCALLY 0.1 CM FROM LATERAL MARGIN. - PREVIOUS BIOPSY SITE. - FIBROCYSTIC CHANGES.  2. Breast, excision, Left additional Deep Margin - BENIGN BREAST TISSUE. - FINAL DEEP MARGIN CLEAR. 3. Breast, excision, Left additional Medial Margin - BENIGN BREAST TISSUE. - FINAL MEDIAL MARGIN CLEAR. 4. Breast, excision, Left additional Inferior Margin - BENIGN BREAST TISSUE. - FINAL INFERIOR MARGIN CLEAR. Microscopic Comment 1. BREAST, INVASIVE TUMOR, WITHOUT LYMPH NODES PRESENT Specimen, including laterality: Left breast Procedure: Lumpectomy with additional deep, medial, and inferior margins. Histologic type: Ductal. Grade: 1 Tubule formation: 1 Nuclear pleomorphism: 2 Mitotic: 1 Tumor size (gross measurement): 1.3 cm Margins: Invasive, distance to closest margin: 0.1 cm from lateral margin. In-situ, distance to closest margin: 0.3 cm from lateral margin. If margin positive, focally or broadly: N/A Lymphovascular invasion: No 1 of 3 FINAL for Brousseau, Harman P ([id number]) Microscopic Comment(continued) Ductal carcinoma in situ: Present. Grade: Low grade. Extensive intraductal component: No. Lobular neoplasia: No. Tumor focality: Unifocal Extent of tumor: Skin: N/A Nipple: N/A Skeletal muscle: N/A Breast prognostic profile: Case [id number] Estrogen receptor: 100%, positive, strong staining Progesterone receptor: 90%, positive,  strong staining. Her 2 neu: Negative, ratio 1.37. Ki-67: 37% Non-neoplastic breast: Fibrocystic changes. TNM: pT1c, pNX, pMX Comments: The invasive carcinoma is focally less than 0.1 cm from the inferior margin in the lumpectomy specimen (part 1); however, the additional inferior margin specimen (part 4) is benign and therefore  the final inferior margin is greater than 0.9 cm. (JDP:gt, 12/11/15) Claudette Laws MD Pathologist, Electronic Signature (Case signed 12/11/2015)  Diagnosis 10/31/2015 1. Breast, left, needle core biopsy, 4:30, mass - INVASIVE DUCTAL CARCINOMA. - DUCTAL CARCINOMA IN SITU. - SEE COMMENT. 2. Breast, left, needle core biopsy, 4 o'clock, cystic mass - FIBROCYSTIC CHANGES. - THERE IS NO EVIDENCE OF MALIGNANCY. Microscopic Comment 1. The carcinoma appears grade 1-2. A breast prognostic profile will be performed and the results reported separately. The results were called to [facility] on 11/01/15. (JBK:gt, 11/01/15) Results: IMMUNOHISTOCHEMICAL AND MORPHOMETRIC ANALYSIS PERFORMED MANUALLY Estrogen Receptor: 100%, POSITIVE, STRONG STAINING INTENSITY Progesterone Receptor: 90%, POSITIVE, STRONG STAINING INTENSITY Proliferation Marker Ki67: 15%  Results: HER2 - NEGATIVE RATIO OF HER2/CEP17 SIGNALS 1.37 AVERAGE HER2 COPY NUMBER PER CELL 2.80  RADIOGRAPHIC STUDIES: I have personally reviewed the radiological images as listed and agreed with the findings in the report. No results found.  ASSESSMENT & PLAN:  83 y.o. Caucasian female, with multiple comorbidities, including hypertension, history of TIA, atrial fibrillation on Xarelto, dementia, bipolar, presented with screening discovered left breast cancer  1. Breast cancer of lower-outer quadrant of left breast, invasive and in situ ductal carcinoma, pT1cN0M0, stage IA, ER+/PR+/HER2- ---The patient had a left lumpectomy on 12/08/15 revealed invasive and in situ ductal carcinoma measuring 1.3 cm with negative margins, the closest margin was 0.1 cm on lateral. I discussed her pathology findings with patient and her daughter. --Given her advanced age and comorbidities, it may be difficult for her to have adjuvant breast radiation, and I think the benefit is limited. The patient saw Dr. Isidore Moos on 11/24/15 who did not recommend  radiation given the patient's -We reviewed together risk of breast cancer recurrence, given her early stage and G1, this is likely low risk disease. -Giving the strong ER and PR expression in her postmenopausal status, I recommended adjuvant endocrine therapy with aromatase inhibitor for a total of 5 years to reduce the risk of cancer recurrence. Potential benefits and side effects were discussed with patient and her daughter she is interested and agrees to proceed. -I will call you anastrozole 1 mg once daily to her pharmacy today, she will start this week. -Given her advanced age and dementia, if she does experience side effects from anastrozole, I'll stop it. -We also discussed the breast cancer surveillance after her surgery. She will continue annual screening mammogram, self exam, and a routine office visit with lab and exam with [place]. -I encouraged her to have healthy diet and exercise regularly. -I spoke with her daughter [name] (over the phone), her caregiver, and nurse (over the phone) who agreed with Anastrozole 43m daily. The patient agreed as well.  2. HTN, history of TIA, dementia, bipolar, AF -She'll continue follow-up with her primary care physician -She is on Xarelto for HIV fibrillation, which had to be held before her breast surgery.  PLAN -The patient will start Anastrozole 1 mg this week. -Lab and f/u in 6 weeks.  All questions were answered. The patient knows to call the clinic with any problems, questions or concerns. I spent 25 minutes counseling the patient face to face. The total time spent in the appointment was  30 minutes and more than 50% was on counseling.     [name], MD 01/29/2016 11:03 AM  This document serves as a record of services personally performed by [name], MD. It was created on her behalf by Darcus Austin, a trained medical scribe. The creation of this record is based on the scribe's personal observations and the provider's statements to them. This  document has been checked and approved by the attending provider.

## 2016-01-29 ENCOUNTER — Ambulatory Visit (HOSPITAL_BASED_OUTPATIENT_CLINIC_OR_DEPARTMENT_OTHER): Payer: Medicare Other | Admitting: Hematology

## 2016-01-29 ENCOUNTER — Encounter: Payer: Self-pay | Admitting: Hematology

## 2016-01-29 ENCOUNTER — Telehealth: Payer: Self-pay | Admitting: Hematology

## 2016-01-29 VITALS — BP 110/58 | HR 91 | Temp 98.1°F | Resp 18 | Ht 64.0 in | Wt 169.8 lb

## 2016-01-29 DIAGNOSIS — Z170 Estrogen receptor positive status [ER+]: Secondary | ICD-10-CM

## 2016-01-29 DIAGNOSIS — C50512 Malignant neoplasm of lower-outer quadrant of left female breast: Secondary | ICD-10-CM

## 2016-01-29 DIAGNOSIS — F319 Bipolar disorder, unspecified: Secondary | ICD-10-CM

## 2016-01-29 DIAGNOSIS — F0390 Unspecified dementia without behavioral disturbance: Secondary | ICD-10-CM

## 2016-01-29 DIAGNOSIS — I10 Essential (primary) hypertension: Secondary | ICD-10-CM | POA: Diagnosis not present

## 2016-01-29 DIAGNOSIS — I4891 Unspecified atrial fibrillation: Secondary | ICD-10-CM | POA: Diagnosis not present

## 2016-01-29 MED ORDER — ANASTROZOLE 1 MG PO TABS: 1.0000 mg | ORAL_TABLET | Freq: Every day | ORAL | 2 refills | Status: DC

## 2016-01-29 NOTE — Telephone Encounter (Signed)
Appointments scheduled per 1/22 LOS. Patient given AVS report and calendars with future scheduled appointments.

## 2016-02-09 ENCOUNTER — Other Ambulatory Visit: Payer: Self-pay | Admitting: Family Medicine

## 2016-02-13 ENCOUNTER — Telehealth: Payer: Self-pay | Admitting: Family Medicine

## 2016-02-13 NOTE — Telephone Encounter (Signed)
[name] with [facility] reports that today pt's wrist, left hand is swollen and tender to the touch.  Doesn't hurt until you push on it.  Pt's lumpectomy incision has healed, but she did start on a new chemo medicine.  [name] wants to know if pt should call surgeon or dr [name]?

## 2016-02-13 NOTE — Telephone Encounter (Signed)
If the area looks infected she needs to be seen. Can you schedule her a follow up?

## 2016-02-15 NOTE — Telephone Encounter (Signed)
Pt states it is not infected, skin is not broken and declined an appointment.   Spoke with the home health aid, Vickii Chafe, who states the wrist is mildy  swollen like possible fluid in it.  She tried to persuade pt to make an appt, pt declined.  [name] states she will get the the home health nurse and call us back.  Maybe they can persuade pt to make appt for Friday.

## 2016-02-19 ENCOUNTER — Other Ambulatory Visit: Payer: Self-pay | Admitting: Family Medicine

## 2016-03-04 ENCOUNTER — Other Ambulatory Visit: Payer: Self-pay | Admitting: Family Medicine

## 2016-03-04 ENCOUNTER — Ambulatory Visit: Payer: Medicare Other | Admitting: Family Medicine

## 2016-03-06 ENCOUNTER — Ambulatory Visit (INDEPENDENT_AMBULATORY_CARE_PROVIDER_SITE_OTHER): Payer: Medicare Other | Admitting: Family Medicine

## 2016-03-06 VITALS — BP 110/60 | HR 79 | Ht 64.0 in | Wt 169.0 lb

## 2016-03-06 DIAGNOSIS — R600 Localized edema: Secondary | ICD-10-CM

## 2016-03-06 DIAGNOSIS — K589 Irritable bowel syndrome without diarrhea: Secondary | ICD-10-CM

## 2016-03-06 DIAGNOSIS — R197 Diarrhea, unspecified: Secondary | ICD-10-CM

## 2016-03-06 DIAGNOSIS — G301 Alzheimer's disease with late onset: Secondary | ICD-10-CM | POA: Diagnosis not present

## 2016-03-06 DIAGNOSIS — K50919 Crohn's disease, unspecified, with unspecified complications: Secondary | ICD-10-CM

## 2016-03-06 DIAGNOSIS — F0281 Dementia in other diseases classified elsewhere with behavioral disturbance: Secondary | ICD-10-CM

## 2016-03-06 DIAGNOSIS — F02818 Dementia in other diseases classified elsewhere, unspecified severity, with other behavioral disturbance: Secondary | ICD-10-CM

## 2016-03-06 NOTE — Patient Instructions (Signed)
HOLD Aricept and calcium/magnesium medications for now. HOLD Tramadol for now

## 2016-03-06 NOTE — Progress Notes (Signed)
Pre visit review using our clinic review tool, if applicable. No additional management support is needed unless otherwise documented below in the visit note. 

## 2016-03-06 NOTE — Progress Notes (Signed)
Subjective:     Patient ID: [name], female   DOB: [birth year], 83 y.o.   MRN: [id number]  HPI Patient is seen for the following issues. She has multiple chronic problems including history of atrial flutter, hypertension, cerebrovascular disease, CAD, obstructive sleep apnea, irritable bowel syndrome, regional enteritis, GERD, history of C. difficile colitis, hypothyroidism, dementia, osteoarthritis, obesity, and left breast cancer which was diagnosed last fall. She has invasive ductal carcinoma and underwent lumpectomy and is currently on medical treatment with Anastrazole . She's had one-week history of left upper extremity swelling. She does have atrial fib history and takes Xarelto 20 mg daily.  Aid with her today states her arm is actually much less swollen today compared to couple days ago. No reported injury. Did not notice any redness or any color change.  She has had chronic diarrhea for multiple years. She's had worsening diarrhea past 2 weeks. They're having to use depends pads and changing her several times per day. She has stool incontinence. She has chronic GI issues as above. No recent reported antibiotics. No recent known exposures to anyone with C. difficile. She has hypothyroidism on replacement but no recent TSH.  Past Medical History:  Diagnosis Date  . Bipolar affective ([place])    History of psychosis  . C. difficile diarrhea   . CAD (coronary artery disease)    LAD 30% 2006  . Cerebrovascular disease   . Colonic polyp   . Crohn's disease ([place])   . Diverticulosis of colon   . Fibromyalgia   . GERD (gastroesophageal reflux disease)   . History of atrial flutter   . History of bronchitis   . History of headache   . History of hyperparathyroidism   . History of tuberculosis   . Hyperlipemia   . Hypertension   . Hypothyroidism   . IBS (irritable bowel syndrome)   . Memory disturbance   . 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 Obesity   . OSA (obstructive sleep  apnea)    denies 12/07/15  . Osteopenia   . Other diseases of lung, not elsewhere classified   . Psychosis   . Radiation 05/2008   5000 cGy to left lower eyelid, parotid lymphatics and upper neck  . Restless legs syndrome (RLS)   . Syncope   . Tuberculosis    Past Surgical History:  Procedure Laterality Date  . APPENDECTOMY    . BLADDER SUSPENSION    . BREAST LUMPECTOMY WITH RADIOACTIVE SEED LOCALIZATION Left 12/08/2015   Procedure: BREAST LUMPECTOMY WITH RADIOACTIVE SEED LOCALIZATION;  Surgeon: Autumn Messing III, MD;  Location: [facility];  Service: General;  Laterality: Left;  . CATARACT EXTRACTION Bilateral   . CHOLECYSTECTOMY    . eye lid surgery  04/04/2011   [facility] by Dr. Norlene Duel  . HEEL SPUR EXCISION    . INCISION AND DRAINAGE PERIRECTAL ABSCESS    . LUMBAR LAMINECTOMY    . Moh';s surg left lower eye lid surgery and lid reconstruction surgery  2/10  . NASAL SINUS SURGERY Right    Right maxillary  . Parathyroid adenoma surgery    . POSTERIOR CERVICAL FUSION/FORAMINOTOMY N/A 10/12/2013   Procedure: Cervical five-Thoracic two cervico-thoracic posterior fusion;  Surgeon: [name], MD;  Location: [facility];  Service: Neurosurgery;  Laterality: N/A;  . TONSILLECTOMY AND ADENOIDECTOMY      reports that she quit smoking about 56 years ago. Her smoking use included Cigarettes. She quit after 6.00 years of  use. She has never used smokeless tobacco. She reports that she does not drink alcohol or use drugs. family history includes Arthritis in her mother; Colon cancer in her maternal grandmother; Heart failure in her father; Hypertension in her mother; Ulcers in her mother. Allergies  Allergen Reactions  . Prednisone Other (See Comments)    Patient had psychiatric episode with hallucinations. Caused 3.5 week long hospitalization & loss of memory   . Carbamazepine Nausea Only and Other (See Comments)    Tegretol; dizziness, blurred vision, nausea, headache, insomnia  . Codeine Nausea And  Vomiting  . Demerol [Meperidine] Nausea And Vomiting  . Diclofenac Sodium Other (See Comments)    severe headache  . Fentanyl Nausea And Vomiting and Other (See Comments)    IV Fentanyl; caused nausea and vomiting Fentanyl patch; caused chest pain, HBP, with second patch experienced vertigo and nausea  . Fluoxetine Hcl Nausea Only and Other (See Comments)    dizziness, blurred vision, nausea, headache, insomnia  . Gentamicin     Other reaction(s): EYE IRRITATION  . Hydromorphone Hcl Nausea And Vomiting  . Ibuprofen Other (See Comments)    Caused ulcers and colitis  . Indomethacin Nausea Only and Other (See Comments)    dizziness, blurred vision, nausea, headache, insomnia  . Lisinopril Cough  . Meperidine Hcl Nausea And Vomiting  . Morphine Nausea And Vomiting  . Pregabalin Other (See Comments)    severe restless legs, insomnia  . Propranolol Hcl Nausea Only and Other (See Comments)    dizziness, blurred vision, nausea, headache, insomnia  . Refresh Lacri-Lube [Artificial Tears] Swelling    Caused swelling, redness, hard crust on eyelids   . Ropinirole Hcl Other (See Comments)    severe restless legs and insomnia  . Sulfamethoxazole Nausea Only and Other (See Comments)    gas, loose stools  . White Petrolatum-Mineral Oil  [Aquaphilic]     Other reaction(s): SWELLING/EDEMA  . Bacitracin Hives, Itching and Rash    redness     Review of Systems Review of systems not reliably obtain from patient as she has advanced dementia    Objective:   Physical Exam  Constitutional: She appears well-developed.  Cardiovascular:  Irregular rhythm rate controlled  Pulmonary/Chest: Effort normal and breath sounds normal. No respiratory distress. She has no wheezes. She has no rales.  Abdominal: Soft. There is no tenderness.  Musculoskeletal:  Has mild edema left upper extremity diffusely. No  color changes. No erythema. No warmth. She has some chronic limited range of motion left shoulder  compared to the right       Assessment:     #1 mild left upper extremity edema which by report is improved today. She is on chronic anticoagulation (Xarelto) and suspicion for DVT is very low. Question lymphedema related her breast cancer which is left-sided.  NO cellulitis changes.  #2 chronic intermittent diarrhea with worsening over the past couple weeks. No recent antibiotics  #3 advanced dementia  #4 hypothyroidism    Plan:     -Check labs with TSH, CBC, comprehensive metabolic panel, GI pathogen panel -We recommended trying to simplify medications and hold tramadol, magnesium/calcium supplement, and Aricept as she is only taking 5 mg -Follow-up promptly for any fever or worsening symptoms -consider refer back to GI if above unrevealing.  [name] MD [facility] at [facility]

## 2016-03-07 LAB — CBC WITH DIFFERENTIAL/PLATELET
BASOS ABS: 0.1 10*3/uL (ref 0.0–0.1)
Basophils Relative: 0.8 % (ref 0.0–3.0)
EOS ABS: 0.1 10*3/uL (ref 0.0–0.7)
Eosinophils Relative: 1 % (ref 0.0–5.0)
HEMATOCRIT: 37.5 % (ref 36.0–46.0)
HEMOGLOBIN: 12.3 g/dL (ref 12.0–15.0)
LYMPHS PCT: 34.4 % (ref 12.0–46.0)
Lymphs Abs: 3.2 10*3/uL (ref 0.7–4.0)
MCHC: 32.8 g/dL (ref 30.0–36.0)
MCV: 86 fl (ref 78.0–100.0)
MONO ABS: 1.1 10*3/uL — AB (ref 0.1–1.0)
Monocytes Relative: 11.3 % (ref 3.0–12.0)
Neutro Abs: 4.9 10*3/uL (ref 1.4–7.7)
Neutrophils Relative %: 52.5 % (ref 43.0–77.0)
Platelets: 235 10*3/uL (ref 150.0–400.0)
RBC: 4.36 Mil/uL (ref 3.87–5.11)
RDW: 18.6 % — AB (ref 11.5–15.5)
WBC: 9.3 10*3/uL (ref 4.0–10.5)

## 2016-03-07 LAB — COMPREHENSIVE METABOLIC PANEL
ALBUMIN: 4 g/dL (ref 3.5–5.2)
ALK PHOS: 135 U/L — AB (ref 39–117)
ALT: 23 U/L (ref 0–35)
AST: 14 U/L (ref 0–37)
BUN: 26 mg/dL — AB (ref 6–23)
CO2: 23 mEq/L (ref 19–32)
CREATININE: 0.88 mg/dL (ref 0.40–1.20)
Calcium: 9.4 mg/dL (ref 8.4–10.5)
Chloride: 102 mEq/L (ref 96–112)
GFR: 65.43 mL/min (ref >60.00)
GLUCOSE: 113 mg/dL — AB (ref 70–99)
POTASSIUM: 4.1 meq/L (ref 3.5–5.1)
SODIUM: 137 meq/L (ref 135–145)
TOTAL PROTEIN: 6.8 g/dL (ref 6.0–8.3)
Total Bilirubin: 0.7 mg/dL (ref 0.2–1.2)

## 2016-03-07 LAB — TSH: TSH: 1.52 u[IU]/mL (ref 0.35–4.50)

## 2016-03-11 ENCOUNTER — Ambulatory Visit: Payer: Medicare Other | Admitting: Family Medicine

## 2016-03-12 NOTE — Progress Notes (Signed)
[place]  [phone] Fax:([phone]) [phone]  Clinic Follow Up Note   Patient Care Team: [name], MD as PCP - General (Family Medicine) [name], MD as Consulting Physician (Hematology) Autumn Messing III, MD as Consulting Physician (General Surgery) 03/18/2016   CHIEF COMPLAINTS:  Left breast cancer   Oncology History   Breast cancer of lower-outer quadrant of left female breast [facility])   Staging form: Breast, AJCC 7th Edition   - Clinical stage from 10/31/2015: Stage IA (T1c, N0, M0) - Signed by [name], MD on 11/19/2015      Breast cancer of lower-outer quadrant of left female breast ([place])   10/23/2015 Mammogram    Diagnostic mammo and [place] shpwed left breast 4:00 complex cystic mass, 4:30 area of shadowing measuring 1.0X1.1X1.2cm      10/31/2015 Initial Diagnosis    Breast cancer of lower-outer quadrant of left female breast ([place])      10/31/2015 Initial Biopsy    Left breast core needle biopsy at 4:30 showed invasive ductal carcinoma and DCIS, , G1-2, 4:00 position biopsy was negative for malignant cells       10/31/2015 Receptors her2    ER 100%+, PR 90%+, HER2-, Ki67 15%      12/08/2015 Pathology Results    Left lumpectomy 12/08/15 Marlou Starks) 1. Breast, lumpectomy, Left - INVASIVE AND IN SITU DUCTAL CARCINOMA, 1.3 CM. - MARGINS NOT INVOLVED. - INVASIVE CARCINOMA FOCALLY 0.1 CM FROM LATERAL MARGIN. - PREVIOUS BIOPSY SITE. - FIBROCYSTIC CHANGES.       01/30/2016 -  Anti-estrogen oral therapy    Adjuvant Anastrozole 1 mg daily       HISTORY OF PRESENTING ILLNESS:  [name] 83 y.o. female with multiple medical comorbidities, including Dementia and bipolar, is here because of her recently diagnosed left breast cancer. She is accompanied by her husband, and her daughter to the clinic today. She was referred by her breast surgeon Dr. Marlou Starks.  This was discovered by screening mammogram, which showed a complex cystic mass at 4:00, and  an area of shadowing measuring 1.2 cm at this for 30 o'clock position of left breast, both lesions were biopsied and the 4:30 o'clock lesion showed DCIS and invasive ductal carcinoma, ER/PR stripe positive, HER-2 negative, Ki-67 15%.  She had prior left breast biopsy for benign lesion, no family history of breast cancer or other malignancy.  She lives with her husband in an independent living facility, she is able to take care of herself, both patient and her husband have dementia, they have a caregiver, who stays with them 8 hours a day.  CURRENT THERAPY: adjuvant Anastrozole 1 mg daily. Started on 01/30/2016  INTERVAL HISTORY: [name] returns for follow up. She is doing well and is accompanied by her care giver. She has some swelling in her left arm. Her care giver says it has been going down, but started about 3 weeks ago. Denies pain from the swelling or loss of ROM. She is tolerating anastrozole well. Denies hot flashes, joint pain, or any other concerns.   MEDICAL HISTORY:  Past Medical History:  Diagnosis Date  . Bipolar affective ([place])    History of psychosis  . C. difficile diarrhea   . CAD (coronary artery disease)    LAD 30% 2006  . Cerebrovascular disease   . Colonic polyp   . Crohn's disease ([place])   . Diverticulosis of colon   . Fibromyalgia   . GERD (gastroesophageal reflux disease)   .  History of atrial flutter   . History of bronchitis   . History of headache   . History of hyperparathyroidism   . History of tuberculosis   . Hyperlipemia   . Hypertension   . Hypothyroidism   . IBS (irritable bowel syndrome)   . Memory disturbance   . 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 Obesity   . OSA (obstructive sleep apnea)    denies 12/07/15  . Osteopenia   . Other diseases of lung, not elsewhere classified   . Psychosis   . Radiation 05/2008   5000 cGy to left lower eyelid, parotid lymphatics and upper neck  . Restless legs syndrome (RLS)   . Syncope   .  Tuberculosis     SURGICAL HISTORY: Past Surgical History:  Procedure Laterality Date  . APPENDECTOMY    . BLADDER SUSPENSION    . BREAST LUMPECTOMY WITH RADIOACTIVE SEED LOCALIZATION Left 12/08/2015   Procedure: BREAST LUMPECTOMY WITH RADIOACTIVE SEED LOCALIZATION;  Surgeon: Autumn Messing III, MD;  Location: [facility];  Service: General;  Laterality: Left;  . CATARACT EXTRACTION Bilateral   . CHOLECYSTECTOMY    . eye lid surgery  04/04/2011   [facility] by Dr. Norlene Duel  . HEEL SPUR EXCISION    . INCISION AND DRAINAGE PERIRECTAL ABSCESS    . LUMBAR LAMINECTOMY    . Moh';s surg left lower eye lid surgery and lid reconstruction surgery  2/10  . NASAL SINUS SURGERY Right    Right maxillary  . Parathyroid adenoma surgery    . POSTERIOR CERVICAL FUSION/FORAMINOTOMY N/A 10/12/2013   Procedure: Cervical five-Thoracic two cervico-thoracic posterior fusion;  Surgeon: [name], MD;  Location: [place] [facility];  Service: Neurosurgery;  Laterality: N/A;  . TONSILLECTOMY AND ADENOIDECTOMY      SOCIAL HISTORY: Social History   Social History  . Marital status: Married    Spouse name: Barnabas Lister  . Number of children: 2  . Years of education: N/A   Occupational History  .  Retired   Social History Main Topics  . Smoking status: Former Smoker    Years: 6.00    Types: Cigarettes    Quit date: 01/08/1960  . Smokeless tobacco: Never Used  . Alcohol use No  . Drug use: No  . Sexual activity: Not on file   Other Topics Concern  . Not on file   Social History Narrative   Patient is married with 2 children   Patient is right handed   Patient is a retired Marine scientist   Patient does not drink any caffeine.    FAMILY HISTORY: Family History  Problem Relation Age of Onset  . Heart failure Father   . Arthritis Mother   . Hypertension Mother   . Ulcers Mother   . Colon cancer Maternal Grandmother   . Pulmonary embolism      ALLERGIES:  is allergic to prednisone; carbamazepine; codeine; demerol  [meperidine]; diclofenac sodium; fentanyl; fluoxetine hcl; gentamicin; hydromorphone hcl; ibuprofen; indomethacin; lisinopril; meperidine hcl; morphine; pregabalin; propranolol hcl; refresh lacri-lube [artificial tears]; ropinirole hcl; sulfamethoxazole; white petrolatum-mineral oil  [aquaphilic]; and bacitracin.  MEDICATIONS:  Current Outpatient Prescriptions  Medication Sig Dispense Refill  . amLODipine (NORVASC) 5 MG tablet TAKE 1 TABLET (5 MG TOTAL) BY MOUTH DAILY. 90 tablet 2  . anastrozole (ARIMIDEX) 1 MG tablet Take 1 tablet (1 mg total) by mouth daily. 30 tablet 2  . aspirin 81 MG EC tablet Take 81 mg by mouth.    Marland Kitchen  atorvastatin (LIPITOR) 40 MG tablet TAKE 1 TABLET DAILY 90 tablet 1  . Biotin 10 MG TABS Take 10 mg by mouth.    . Calcium 500 MG tablet Take 2 tabs by mouth daily at 6 pm 60 tablet 11  . Cholecalciferol (VITAMIN D3) 2000 units TABS Take 2,000 Units by mouth daily at 12 noon. 30 tablet 11  . dicyclomine (BENTYL) 10 MG capsule Take 1 capsule (10 mg total) by mouth 3 (three) times daily before meals. 20 capsule 0  . donepezil (ARICEPT) 10 MG tablet     . Ergocalciferol (VITAMIN D2) 2000 units TABS Take one tablet daily.    . fluticasone (FLONASE) 50 MCG/ACT nasal spray Place 2 sprays into both nostrils at bedtime. 16 g 2  . levothyroxine (SYNTHROID, LEVOTHROID) 88 MCG tablet Take 1 tablet (88 mcg total) by mouth daily before breakfast. Pt needs an appointment for further refills 30 tablet 2  . lidocaine (LIDODERM) 5 % Frequency:PRN   Dosage:0.0     Instructions:  Note:Dose: 1    . Melatonin 10 MG TABS Take 10 mg by mouth 1 day or 1 dose. (Patient taking differently: Take 10 mg by mouth at bedtime. ) 30 tablet 5  . memantine (NAMENDA) 10 MG tablet TAKE 1 TABLET (10 MG TOTAL) BY MOUTH TWO (TWO) TIMES DAILY. 180 tablet 2  . mesalamine (LIALDA) 1.2 g EC tablet Take 3 tablets (3.6 g total) by mouth daily with lunch. 90 tablet 2  . Omega-3 Fatty Acids (FISH OIL PO) Take by mouth.      . Propylene Glycol (SYSTANE BALANCE) 0.6 % SOLN Place 1 drop into both eyes daily.    . risperiDONE (RISPERDAL) 1 MG tablet Take 1 mg by mouth at bedtime.    . rivaroxaban (XARELTO) 20 MG TABS tablet Take 1 tablet (20 mg total) by mouth daily with supper. 90 tablet 2  . Simethicone 180 MG CAPS Take 180 mg by mouth.    . vitamin C (ASCORBIC ACID) 500 MG tablet Frequency:QD   Dosage:0.0     Instructions:  Note:Dose: 1     No current facility-administered medications for this visit.     REVIEW OF SYSTEMS:   Constitutional: Denies fevers, chills or abnormal night sweats (+) swelling left arm  Eyes: Denies blurriness of vision, double vision or watery eyes Ears, nose, mouth, throat, and face: Denies mucositis or sore throat Respiratory: Denies cough, dyspnea or wheezes Cardiovascular: Denies palpitation, chest discomfort or lower extremity swelling Gastrointestinal:  Denies nausea, heartburn or change in bowel habits Skin: Denies abnormal skin rashes Lymphatics: Denies new lymphadenopathy or easy bruising Neurological:Denies numbness, tingling or new weaknesses Behavioral/Psych: Mood is stable, no new changes  All other systems were reviewed with the patient and are negative.  PHYSICAL EXAMINATION: ECOG PERFORMANCE STATUS: 2 - Symptomatic, <50% confined to bed  Vitals:   03/18/16 1051  BP: 132/65  Pulse: 78  Resp: 19   Filed Weights   03/18/16 1051  Weight: 165 lb 4.8 oz (75 kg)   GENERAL:alert, no distress and comfortable SKIN: skin color, texture, turgor are normal, no rashes or significant lesions EYES: normal, conjunctiva are pink and non-injected, sclera clear (+) b/l discharge  OROPHARYNX:no exudate, no erythema and lips, buccal mucosa, and tongue normal  NECK: supple, thyroid normal size, non-tender, without nodularity LYMPH:  no palpable lymphadenopathy in the cervical, axillary or inguinal (+) left arm edema LUNGS: clear to auscultation and percussion with normal  breathing effort HEART: regular rate &  rhythm and no murmurs and no lower extremity edema ABDOMEN:abdomen soft, non-tender and normal bowel sounds Musculoskeletal:no cyanosis of digits and no clubbing (+) Ambulatory with a walker. PSYCH: alert & oriented x 3 with fluent speech NEURO: no focal motor/sensory deficits BREAST: She has a surgical incision in the LOQ of the left breast that has healed well. No discharge or skin erythema. No palpable masses in either breast. No nipple discharge or bleeding noted in the bilateral breasts. (+) fungus present on L breast, but not R. (+) Skin erythema left breast from fungal infection  LABORATORY DATA:  I have reviewed the data as listed CBC Latest Ref Rng & Units 03/18/2016 03/06/2016 12/08/2015  WBC 3.9 - 10.3 10e3/uL 11.3(H) 9.3 6.1  Hemoglobin 11.6 - 15.9 g/dL 12.1 12.3 12.8  Hematocrit 34.8 - 46.6 % 37.9 37.5 40.5  Platelets 145 - 400 10e3/uL 234 235.0 180   CMP Latest Ref Rng & Units 03/18/2016 03/06/2016 12/08/2015  Glucose 70 - 140 mg/dl 93 113(H) 115(H)  BUN 7.0 - 26.0 mg/dL 19.6 26(H) 11  Creatinine 0.6 - 1.1 mg/dL 0.9 0.88 0.82  Sodium 136 - 145 mEq/L 140 137 141  Potassium 3.5 - 5.1 mEq/L 4.3 4.1 3.9  Chloride 96 - 112 mEq/L - 102 105  CO2 22 - 29 mEq/L _0 Calcium 8.4 - 10.4 mg/dL 10.0 9.4 9.6  Total Protein 6.4 - 8.3 g/dL 7.1 6.8 -  Total Bilirubin 0.20 - 1.20 mg/dL 0.88 0.7 -  Alkaline Phos 40 - 150 U/L 104 135(H) -  AST 5 - 34 U/L 13 14 -  ALT 0 - 55 U/L 11 23 -   Pathology report  Diagnosis 12/08/2015 1. Breast, lumpectomy, Left - INVASIVE AND IN SITU DUCTAL CARCINOMA, 1.3 CM. - MARGINS NOT INVOLVED. - INVASIVE CARCINOMA FOCALLY 0.1 CM FROM LATERAL MARGIN. - PREVIOUS BIOPSY SITE. - FIBROCYSTIC CHANGES.  2. Breast, excision, Left additional Deep Margin - BENIGN BREAST TISSUE. - FINAL DEEP MARGIN CLEAR. 3. Breast, excision, Left additional Medial Margin - BENIGN BREAST TISSUE. - FINAL MEDIAL MARGIN CLEAR. 4. Breast,  excision, Left additional Inferior Margin - BENIGN BREAST TISSUE. - FINAL INFERIOR MARGIN CLEAR. Microscopic Comment 1. BREAST, INVASIVE TUMOR, WITHOUT LYMPH NODES PRESENT Specimen, including laterality: Left breast Procedure: Lumpectomy with additional deep, medial, and inferior margins. Histologic type: Ductal. Grade: 1 Tubule formation: 1 Nuclear pleomorphism: 2 Mitotic: 1 Tumor size (gross measurement): 1.3 cm Margins: Invasive, distance to closest margin: 0.1 cm from lateral margin. In-situ, distance to closest margin: 0.3 cm from lateral margin. If margin positive, focally or broadly: N/A Lymphovascular invasion: No 1 of 3 FINAL for Toner, Danira P ([id number]) Microscopic Comment(continued) Ductal carcinoma in situ: Present. Grade: Low grade. Extensive intraductal component: No. Lobular neoplasia: No. Tumor focality: Unifocal Extent of tumor: Skin: N/A Nipple: N/A Skeletal muscle: N/A Breast prognostic profile: Case [id number] Estrogen receptor: 100%, positive, strong staining Progesterone receptor: 90%, positive, strong staining. Her 2 neu: Negative, ratio 1.37. Ki-67: 37% Non-neoplastic breast: Fibrocystic changes. TNM: pT1c, pNX, pMX Comments: The invasive carcinoma is focally less than 0.1 cm from the inferior margin in the lumpectomy specimen (part 1); however, the additional inferior margin specimen (part 4) is benign and therefore the final inferior margin is greater than 0.9 cm. (JDP:gt, 12/11/15) Claudette Laws MD Pathologist, Electronic Signature (Case signed 12/11/2015)  Diagnosis 10/31/2015 1. Breast, left, needle core biopsy, 4:30, mass - INVASIVE DUCTAL CARCINOMA. - DUCTAL CARCINOMA IN SITU. - SEE COMMENT. 2. Breast, left, needle core  biopsy, 4 o'clock, cystic mass - FIBROCYSTIC CHANGES. - THERE IS NO EVIDENCE OF MALIGNANCY. Microscopic Comment 1. The carcinoma appears grade 1-2. A breast prognostic profile will be performed and the results  reported separately. The results were called to [facility] on 11/01/15. (JBK:gt, 11/01/15) Results: IMMUNOHISTOCHEMICAL AND MORPHOMETRIC ANALYSIS PERFORMED MANUALLY Estrogen Receptor: 100%, POSITIVE, STRONG STAINING INTENSITY Progesterone Receptor: 90%, POSITIVE, STRONG STAINING INTENSITY Proliferation Marker Ki67: 15%  Results: HER2 - NEGATIVE RATIO OF HER2/CEP17 SIGNALS 1.37 AVERAGE HER2 COPY NUMBER PER CELL 2.80  RADIOGRAPHIC STUDIES: I have personally reviewed the radiological images as listed and agreed with the findings in the report.  Diagnostic Mammogram 10/23/2015 IMPRESSION: Left breast 4 o'clock complex cystic mass.  Left breast 4:30 o'clock area of shadowing.  ASSESSMENT & PLAN:  83 y.o. Caucasian female, with multiple comorbidities, including hypertension, history of TIA, atrial fibrillation on Xarelto, dementia, bipolar, presented with screening discovered left breast cancer  1. Breast cancer of lower-outer quadrant of left breast, invasive and in situ ductal carcinoma, pT1cN0M0, stage IA, ER+/PR+/HER2- ---The patient had a left lumpectomy on 12/08/15 revealed invasive and in situ ductal carcinoma measuring 1.3 cm with negative margins, the closest margin was 0.1 cm on lateral. I discussed her pathology findings with patient and her daughter. --Given her advanced age and comorbidities, it may be difficult for her to have adjuvant breast radiation, and I think the benefit is limited. The patient saw Dr. Isidore Moos on 11/24/15 who did not recommend radiation given the patient's -We reviewed together risk of breast cancer recurrence, given her early stage and G1, this is likely low risk disease. -Giving the strong ER and PR expression in her postmenopausal status, I recommended adjuvant endocrine therapy with aromatase inhibitor for a total of 5 years to reduce the risk of cancer recurrence. Potential benefits and side effects were discussed with patient and  her daughter she is interested and agrees to proceed. -Given her advanced age and dementia, if she does experience side effects from anastrozole, I'll stop it. -We again discussed the breast cancer surveillance after her surgery. She will continue annual screening mammogram, self exam, and a routine office visit with lab and exam with [place]. -I encouraged her to have healthy diet and exercise regularly. -She tolerating anastrozole very well, no noticeable side effects so far, we'll continue, plan for total 5 years if she is able tolerate.  2. HTN, history of TIA, dementia, bipolar, AF -She'll continue follow-up with her primary care physician -She is on Xarelto for HIV fibrillation.   3. Left Arm Swelling -Started about 3 weeks ago. -Caregiver says it is getting better, but was much worse previously.  -She had left breast lumpectomy, but no sentinel lymph nodes were taken out. It is little unusual to develop lymphedema after breast lumpectomy. I'll will ordered US of L arm to rule out DVT. She is on Xarelto, much less risk to have thrombosis. -I will refer her to our lymphedema clinic for physical therapy.  PLAN -Continue Anastrozole 1 mg -Ordered US of L arm to rule out DVT this week  -I'll refer her to lymphedema clinic for physical therapy  -Lab and f/u in 3 months.   All questions were answered. The patient knows to call the clinic with any problems, questions or concerns.  I spent 25 minutes counseling the patient face to face. The total time spent in the appointment was 30 minutes and more than 50% was on counseling.  This document serves as a  record of services personally performed by [name], MD. It was created on her behalf by Martinique Casey, a trained medical scribe. The creation of this record is based on the scribe's personal observations and the provider's statements to them. This document has been checked and approved by the attending provider.  I have reviewed the above  documentation for accuracy and completeness and I agree with the above.   [name], MD 03/18/2016 11:25 AM

## 2016-03-14 ENCOUNTER — Other Ambulatory Visit: Payer: Self-pay | Admitting: *Deleted

## 2016-03-14 MED ORDER — CALCIUM 500 MG PO TABS: ORAL_TABLET | ORAL | 11 refills | Status: DC

## 2016-03-14 MED ORDER — VITAMIN D3 50 MCG (2000 UT) PO TABS: 2000.0000 [IU] | ORAL_TABLET | Freq: Every day | ORAL | 11 refills | Status: DC

## 2016-03-18 ENCOUNTER — Telehealth: Payer: Self-pay

## 2016-03-18 ENCOUNTER — Ambulatory Visit (HOSPITAL_BASED_OUTPATIENT_CLINIC_OR_DEPARTMENT_OTHER): Payer: Medicare Other | Admitting: Hematology

## 2016-03-18 ENCOUNTER — Ambulatory Visit (HOSPITAL_COMMUNITY)
Admission: RE | Admit: 2016-03-18 | Discharge: 2016-03-18 | Disposition: A | Discharge status: Home / Self Care | Payer: Medicare Other | Source: Ambulatory Visit | Attending: Hematology | Admitting: Hematology

## 2016-03-18 ENCOUNTER — Other Ambulatory Visit (HOSPITAL_BASED_OUTPATIENT_CLINIC_OR_DEPARTMENT_OTHER): Payer: Medicare Other

## 2016-03-18 ENCOUNTER — Encounter: Payer: Self-pay | Admitting: Hematology

## 2016-03-18 ENCOUNTER — Other Ambulatory Visit: Payer: Self-pay | Admitting: Hematology

## 2016-03-18 VITALS — BP 132/65 | HR 78 | Resp 19 | Ht 64.0 in | Wt 165.3 lb

## 2016-03-18 DIAGNOSIS — Z170 Estrogen receptor positive status [ER+]: Secondary | ICD-10-CM | POA: Diagnosis not present

## 2016-03-18 DIAGNOSIS — R600 Localized edema: Secondary | ICD-10-CM

## 2016-03-18 DIAGNOSIS — M7989 Other specified soft tissue disorders: Secondary | ICD-10-CM | POA: Diagnosis not present

## 2016-03-18 DIAGNOSIS — C50512 Malignant neoplasm of lower-outer quadrant of left female breast: Secondary | ICD-10-CM

## 2016-03-18 DIAGNOSIS — F319 Bipolar disorder, unspecified: Secondary | ICD-10-CM | POA: Diagnosis not present

## 2016-03-18 DIAGNOSIS — R609 Edema, unspecified: Secondary | ICD-10-CM

## 2016-03-18 DIAGNOSIS — I10 Essential (primary) hypertension: Secondary | ICD-10-CM | POA: Diagnosis not present

## 2016-03-18 DIAGNOSIS — I4891 Unspecified atrial fibrillation: Secondary | ICD-10-CM

## 2016-03-18 LAB — COMPREHENSIVE METABOLIC PANEL
ALBUMIN: 3.4 g/dL — AB (ref 3.5–5.0)
ALK PHOS: 104 U/L (ref 40–150)
ALT: 11 U/L (ref 0–55)
ANION GAP: 10 meq/L (ref 3–11)
AST: 13 U/L (ref 5–34)
BUN: 19.6 mg/dL (ref 7.0–26.0)
CALCIUM: 10 mg/dL (ref 8.4–10.4)
CHLORIDE: 105 meq/L (ref 98–109)
CO2: 25 mEq/L (ref 22–29)
Creatinine: 0.9 mg/dL (ref 0.6–1.1)
EGFR: 59 mL/min/{1.73_m2} — AB (ref >90)
Glucose: 93 mg/dl (ref 70–140)
POTASSIUM: 4.3 meq/L (ref 3.5–5.1)
Sodium: 140 mEq/L (ref 136–145)
Total Bilirubin: 0.88 mg/dL (ref 0.20–1.20)
Total Protein: 7.1 g/dL (ref 6.4–8.3)

## 2016-03-18 LAB — CBC WITH DIFFERENTIAL/PLATELET
BASO%: 0.7 % (ref 0.0–2.0)
BASOS ABS: 0.1 10*3/uL (ref 0.0–0.1)
EOS ABS: 0.1 10*3/uL (ref 0.0–0.5)
EOS%: 1.1 % (ref 0.0–7.0)
HEMATOCRIT: 37.9 % (ref 34.8–46.6)
HEMOGLOBIN: 12.1 g/dL (ref 11.6–15.9)
LYMPH#: 2.9 10*3/uL (ref 0.9–3.3)
LYMPH%: 25.4 % (ref 14.0–49.7)
MCH: 27.6 pg (ref 25.1–34.0)
MCHC: 32 g/dL (ref 31.5–36.0)
MCV: 86.2 fL (ref 79.5–101.0)
MONO#: 0.7 10*3/uL (ref 0.1–0.9)
MONO%: 6.5 % (ref 0.0–14.0)
NEUT#: 7.5 10*3/uL — ABNORMAL HIGH (ref 1.5–6.5)
NEUT%: 66.3 % (ref 38.4–76.8)
PLATELETS: 234 10*3/uL (ref 145–400)
RBC: 4.39 10*6/uL (ref 3.70–5.45)
RDW: 17.7 % — ABNORMAL HIGH (ref 11.2–14.5)
WBC: 11.3 10*3/uL — ABNORMAL HIGH (ref 3.9–10.3)

## 2016-03-18 NOTE — Telephone Encounter (Signed)
Clare at [facility] called report of LUE venous study is negative for DVT and negative for SVT. This RN instructed her to let pt go home.  Called Dr Burr Medico and let her know.

## 2016-03-18 NOTE — Progress Notes (Signed)
*  PRELIMINARY RESULTS* Vascular Ultrasound Left upper extremity venous duplex has been completed.  Preliminary findings: No evidence of deep or superficial vein thrombosis in the visualized veins of the left upper extremity. Preliminary results called to [facility] at the [facility] @ 12:45  Everrett Coombe 03/18/2016, 12:57 PM

## 2016-03-20 ENCOUNTER — Ambulatory Visit (INDEPENDENT_AMBULATORY_CARE_PROVIDER_SITE_OTHER): Payer: Medicare Other | Admitting: Family Medicine

## 2016-03-20 ENCOUNTER — Encounter: Payer: Self-pay | Admitting: Family Medicine

## 2016-03-20 VITALS — BP 110/68 | HR 90 | Temp 97.4°F | Wt 167.0 lb

## 2016-03-20 DIAGNOSIS — I890 Lymphedema, not elsewhere classified: Secondary | ICD-10-CM

## 2016-03-20 DIAGNOSIS — K529 Noninfective gastroenteritis and colitis, unspecified: Secondary | ICD-10-CM | POA: Diagnosis not present

## 2016-03-20 NOTE — Progress Notes (Signed)
Subjective:     Patient ID: [name], female   DOB: 09-Dec-1932, 83 y.o.   MRN: [id number]  HPI   Patient has advanced dementia so history is very limited. Her husband also has advanced dementia. They're here with caregiver who helps provide some of the history today   Patient seen for follow-up from visit in the February. She's had some recent left upper extremity edema and has breast cancer history. Ultrasound revealed no DVT. Also, patient is on chronic anticoagulation which would make risk of DVT very low. She had not had any recent cellulitis changes. She has compression for left upper extremity ordered.  She has chronic diarrhea. She has diagnosis of questionable irritable bowel syndrome and also question of regional enteritis. She apparently is still taking mesalamine. She had colonoscopy back around 2009. She was seen recently with some worsening diarrhea. Her electrolytes were unremarkable. We had ordered GI pathogen panel but apparently this was never done and we cannot find.  She's not had any fever. No bloody stools. Caregiver today states that she still has fairly frequent loose stools but these are somewhat less frequent and less severe. She has frequent leakage and they say they have to change her pads about every 3 hours.  They state she is eating fairly well. No complaint of abdominal pain. No recent antibiotics. Past history of C. Difficile.  Past Medical History:  Diagnosis Date  . Bipolar affective ([place])    History of psychosis  . C. difficile diarrhea   . CAD (coronary artery disease)    LAD 30% 2006  . Cerebrovascular disease   . Colonic polyp   . Crohn's disease ([place])   . Diverticulosis of colon   . Fibromyalgia   . GERD (gastroesophageal reflux disease)   . History of atrial flutter   . History of bronchitis   . History of headache   . History of hyperparathyroidism   . History of tuberculosis   . Hyperlipemia   . Hypertension   . Hypothyroidism   . IBS  (irritable bowel syndrome)   . Memory disturbance   . 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 Obesity   . OSA (obstructive sleep apnea)    denies 12/07/15  . Osteopenia   . Other diseases of lung, not elsewhere classified   . Psychosis   . Radiation 05/2008   5000 cGy to left lower eyelid, parotid lymphatics and upper neck  . Restless legs syndrome (RLS)   . Syncope   . Tuberculosis    Past Surgical History:  Procedure Laterality Date  . APPENDECTOMY    . BLADDER SUSPENSION    . BREAST LUMPECTOMY WITH RADIOACTIVE SEED LOCALIZATION Left 12/08/2015   Procedure: BREAST LUMPECTOMY WITH RADIOACTIVE SEED LOCALIZATION;  Surgeon: Autumn Messing III, MD;  Location: [facility];  Service: General;  Laterality: Left;  . CATARACT EXTRACTION Bilateral   . CHOLECYSTECTOMY    . eye lid surgery  04/04/2011   [facility] by Dr. Norlene Duel  . HEEL SPUR EXCISION    . INCISION AND DRAINAGE PERIRECTAL ABSCESS    . LUMBAR LAMINECTOMY    . Moh';s surg left lower eye lid surgery and lid reconstruction surgery  2/10  . NASAL SINUS SURGERY Right    Right maxillary  . Parathyroid adenoma surgery    . POSTERIOR CERVICAL FUSION/FORAMINOTOMY N/A 10/12/2013   Procedure: Cervical five-Thoracic two cervico-thoracic posterior fusion;  Surgeon: [name], MD;  Location: [facility];  Service:  Neurosurgery;  Laterality: N/A;  . TONSILLECTOMY AND ADENOIDECTOMY      reports that she quit smoking about 56 years ago. Her smoking use included Cigarettes. She quit after 6.00 years of use. She has never used smokeless tobacco. She reports that she does not drink alcohol or use drugs. family history includes Arthritis in her mother; Colon cancer in her maternal grandmother; Heart failure in her father; Hypertension in her mother; Ulcers in her mother. Allergies  Allergen Reactions  . Prednisone Other (See Comments)    Patient had psychiatric episode with hallucinations. Caused 3.5 week long hospitalization & loss of memory   .  Carbamazepine Nausea Only and Other (See Comments)    Tegretol; dizziness, blurred vision, nausea, headache, insomnia  . Codeine Nausea And Vomiting  . Demerol [Meperidine] Nausea And Vomiting  . Diclofenac Sodium Other (See Comments)    severe headache  . Fentanyl Nausea And Vomiting and Other (See Comments)    IV Fentanyl; caused nausea and vomiting Fentanyl patch; caused chest pain, HBP, with second patch experienced vertigo and nausea  . Fluoxetine Hcl Nausea Only and Other (See Comments)    dizziness, blurred vision, nausea, headache, insomnia  . Gentamicin     Other reaction(s): EYE IRRITATION  . Hydromorphone Hcl Nausea And Vomiting  . Ibuprofen Other (See Comments)    Caused ulcers and colitis  . Indomethacin Nausea Only and Other (See Comments)    dizziness, blurred vision, nausea, headache, insomnia  . Lisinopril Cough  . Meperidine Hcl Nausea And Vomiting  . Morphine Nausea And Vomiting  . Pregabalin Other (See Comments)    severe restless legs, insomnia  . Propranolol Hcl Nausea Only and Other (See Comments)    dizziness, blurred vision, nausea, headache, insomnia  . Refresh Lacri-Lube [Artificial Tears] Swelling    Caused swelling, redness, hard crust on eyelids   . Ropinirole Hcl Other (See Comments)    severe restless legs and insomnia  . Sulfamethoxazole Nausea Only and Other (See Comments)    gas, loose stools  . White Petrolatum-Mineral Oil  [Aquaphilic]     Other reaction(s): SWELLING/EDEMA  . Bacitracin Hives, Itching and Rash    redness       Review of Systems Not reliably obtainable from patient. No reported fever. No nausea, vomiting, or any bloody stools.    Objective:   Physical Exam  Constitutional: She appears well-developed and well-nourished.  She is somewhat lethargic today but easily arousable.  Eyes: Pupils are equal, round, and reactive to light.  Neck: Neck supple. No JVD present. No thyromegaly present.  Cardiovascular: Normal rate  and regular rhythm.  Exam reveals no gallop.   Pulmonary/Chest: Effort normal and breath sounds normal. No respiratory distress. She has no wheezes. She has no rales.  Abdominal: Soft. There is no tenderness.  Musculoskeletal: She exhibits edema.  No leg edema. She has some diffuse edema left upper extremity which is very likely lymphedema. No erythema. No warmth. Nontender.       Assessment:     #1 history of chronic intermittent diarrhea. She has reported history of irritable bowel syndrome and question of regional enteritis. Caregiver states her diarrhea is slightly improved from last visit  #2 advanced dementia  #3 breast cancer with left upper extremity lymphedema    Plan:     -set up referral to [facility] appreciate their input whether to continue Mesalamine. -we have asked staff to make sure her current medication list from here reconciles with their list. -compression  for LUE has been ordered.  [name] MD [facility]

## 2016-03-20 NOTE — Progress Notes (Signed)
Pre visit review using our clinic review tool, if applicable. No additional management support is needed unless otherwise documented below in the visit note. 

## 2016-03-20 NOTE — Addendum Note (Signed)
Addended by: [name] on: 03/20/2016 02:20 PM   Modules accepted: Orders

## 2016-03-20 NOTE — Patient Instructions (Signed)
We will set up GI referral.

## 2016-03-26 ENCOUNTER — Other Ambulatory Visit: Payer: Self-pay | Admitting: Hematology

## 2016-03-27 ENCOUNTER — Telehealth: Payer: Self-pay | Admitting: Hematology

## 2016-03-27 DIAGNOSIS — F0633 Mood disorder due to known physiological condition with manic features: Secondary | ICD-10-CM | POA: Diagnosis not present

## 2016-03-27 NOTE — Telephone Encounter (Signed)
S/w pt's caregiver, gave appt for 6/18 @ 1.45pm and advised PT will call to schedule.

## 2016-04-01 ENCOUNTER — Other Ambulatory Visit: Payer: Self-pay | Admitting: Family Medicine

## 2016-04-01 DIAGNOSIS — I4891 Unspecified atrial fibrillation: Secondary | ICD-10-CM

## 2016-04-02 ENCOUNTER — Telehealth: Payer: Self-pay | Admitting: Family Medicine

## 2016-04-02 DIAGNOSIS — I4891 Unspecified atrial fibrillation: Secondary | ICD-10-CM

## 2016-04-02 MED ORDER — RIVAROXABAN 20 MG PO TABS: ORAL_TABLET | ORAL | 0 refills | Status: DC

## 2016-04-02 NOTE — Telephone Encounter (Signed)
Martyn Ehrich, RN is requesting at least a weeks worth of xarelto tab 20 MG called in to [facility] due to the [facility] takes so long to receive need before next week have on hand a weeks worth and [facility] will be sending a script over.

## 2016-04-09 IMAGING — CT CT ABD-PELV W/O CM
2 of 4 series · 16 of 46 positions shown, 18 images · non-contrast
Comparison: 11/19/2014

CLINICAL DATA: Patient reports episode of nausea with emesis this
morning. History of diverticulitis reported

EXAM:
CT ABDOMEN AND PELVIS WITHOUT CONTRAST
TECHNIQUE: Multidetector CT imaging of the abdomen and pelvis was performed
following the standard protocol without IV contrast.

[Series 2: abd/pel w/o · axial · non-contrast · 0.89mm/px · z∈[-376,+34]mm · 13 of 90 slices shown, 15 images]
[im 4/90  soft-tissue]
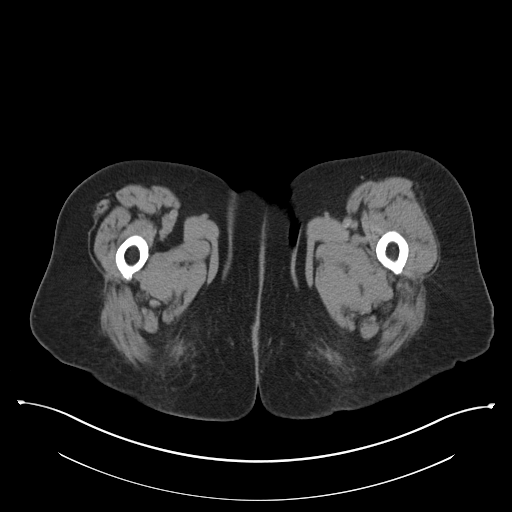
[im 4/90  bone]
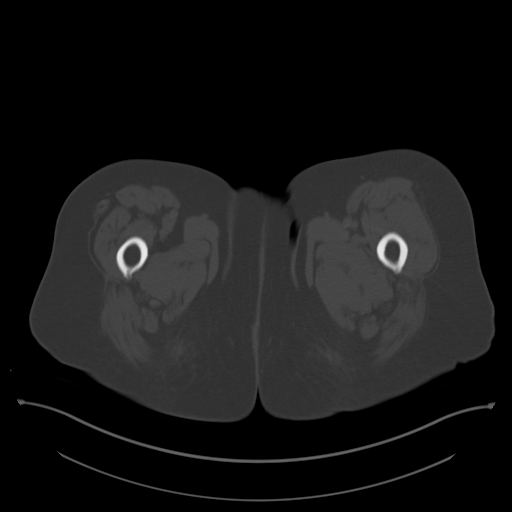
[im 12/90  soft-tissue]
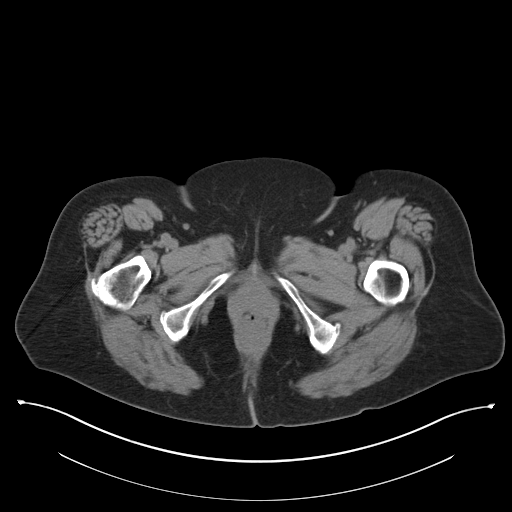
[im 20/90  soft-tissue]
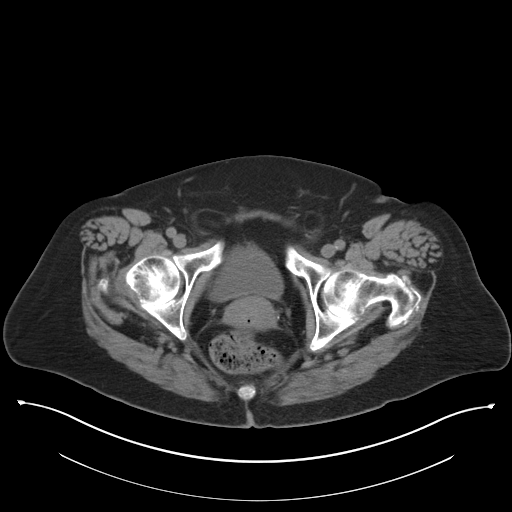
[im 24/90  soft-tissue]
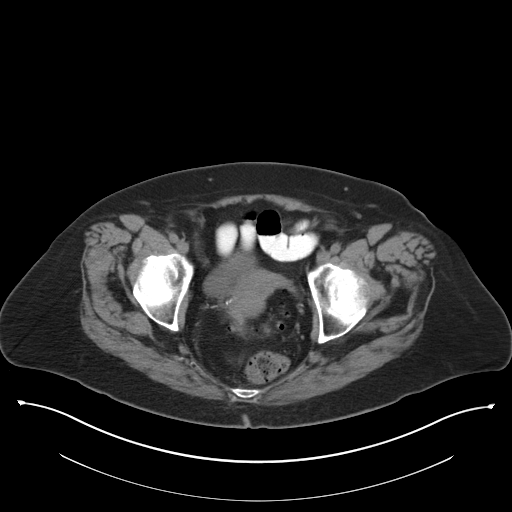
[im 31/90  soft-tissue]
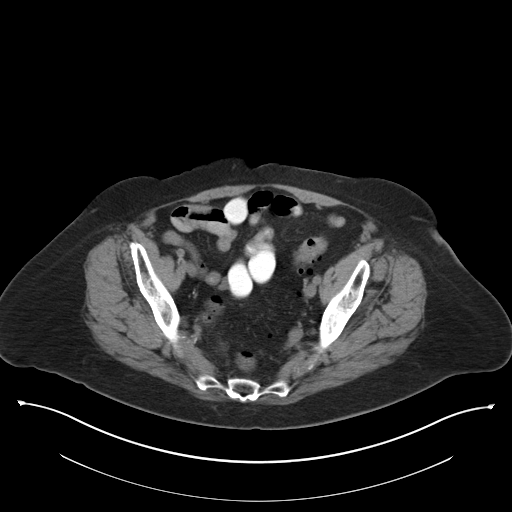
[im 39/90  soft-tissue]
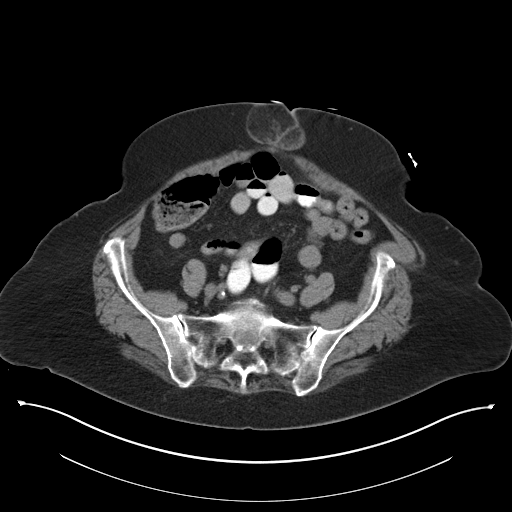
[im 47/90  soft-tissue]
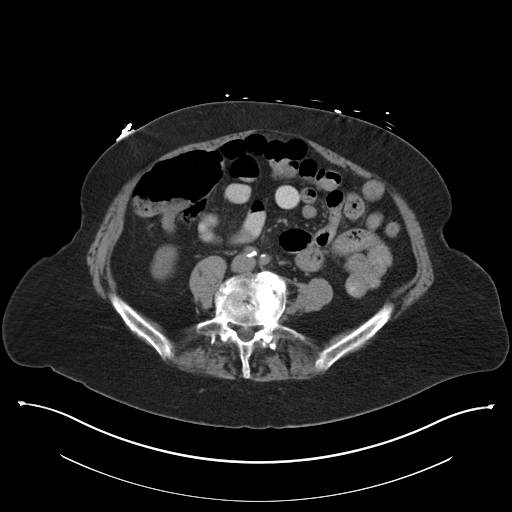
[im 51/90  soft-tissue]
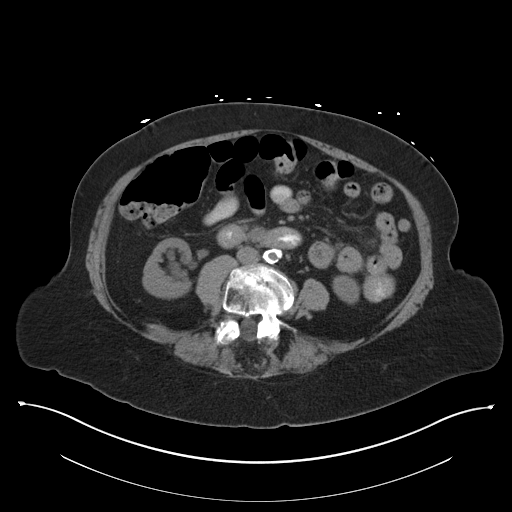
[im 59/90  soft-tissue]
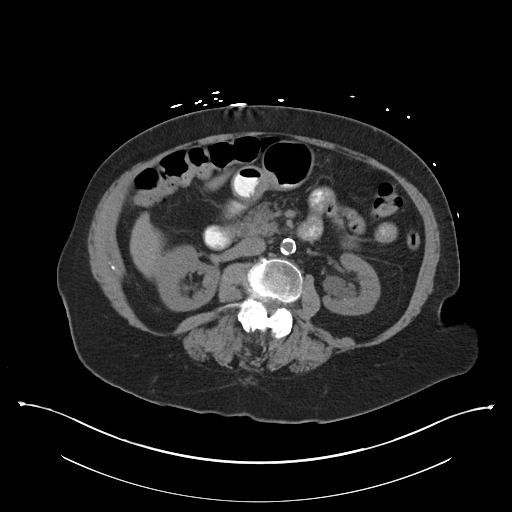
[im 59/90  bone]
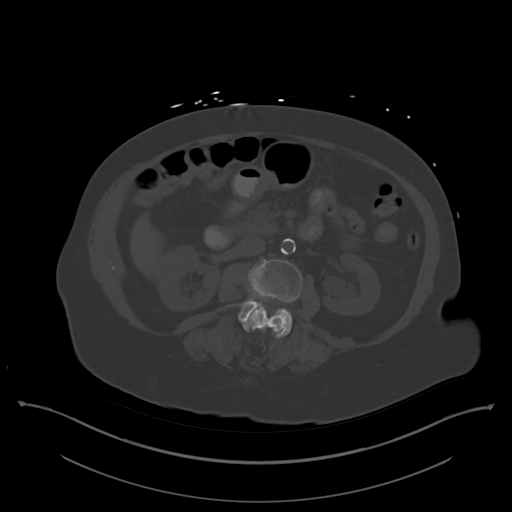
[im 66/90  soft-tissue]
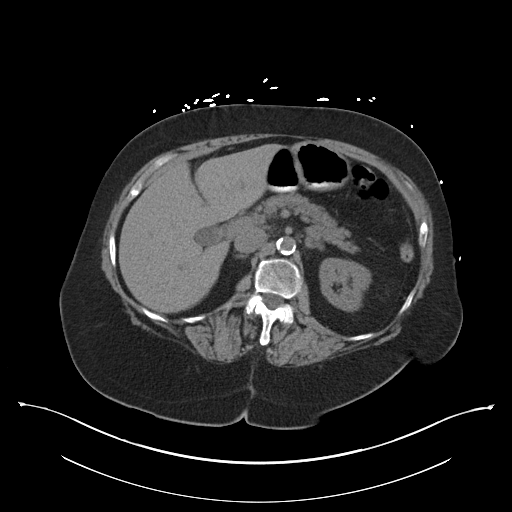
[im 70/90  soft-tissue]
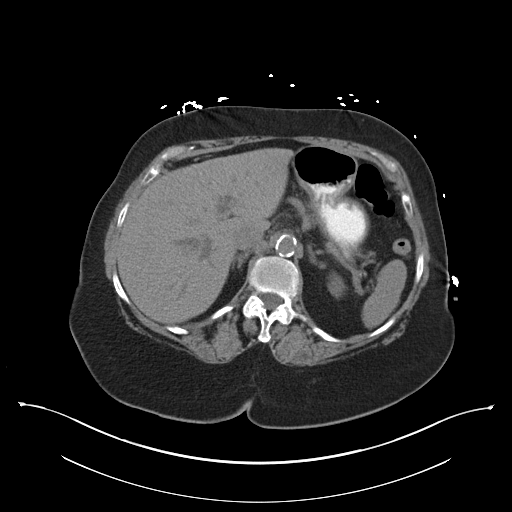
[im 78/90  soft-tissue]
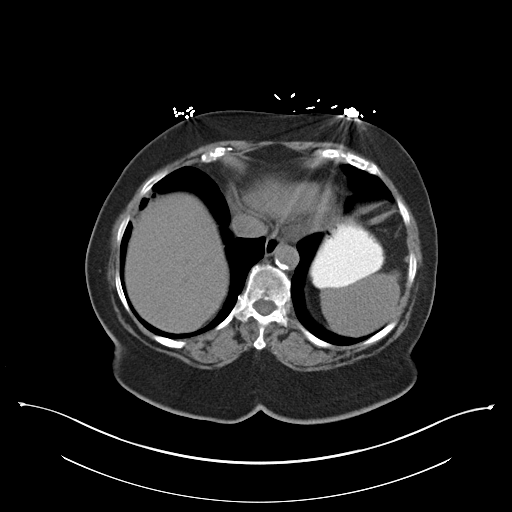
[im 86/90  soft-tissue]
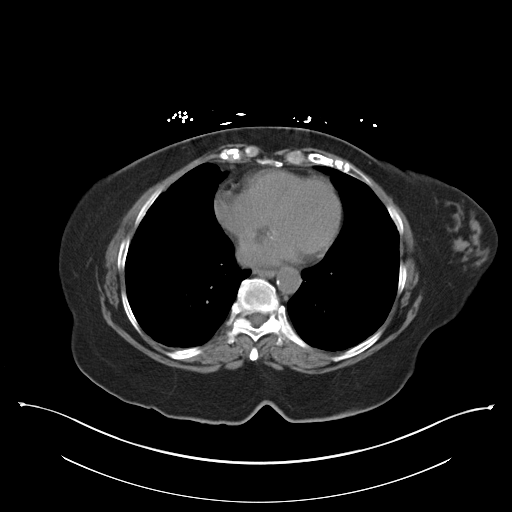

[Series 5: coronal · coronal · 0.81mm/px · 3 of 98 slices shown]
[im 33/98  soft-tissue]
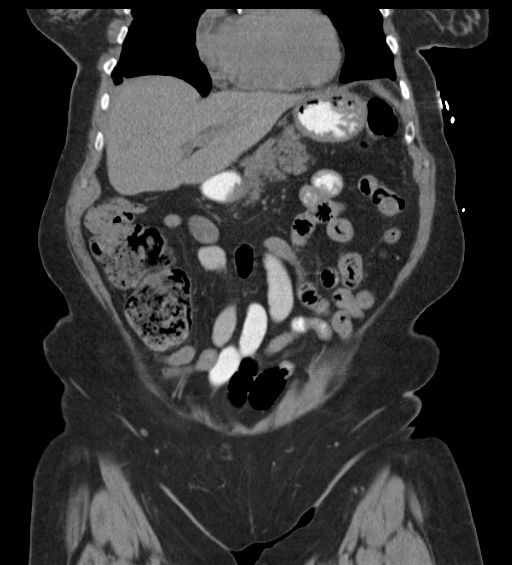
[im 44/98  soft-tissue]
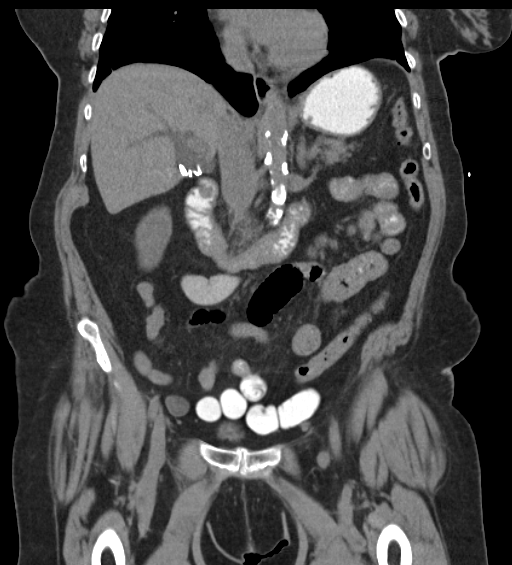
[im 54/98  soft-tissue]
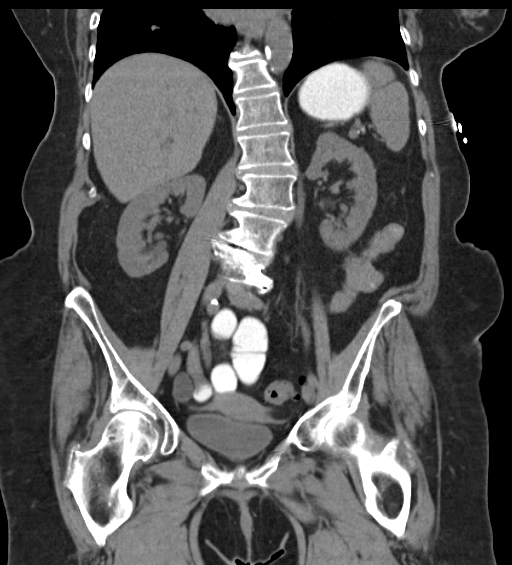

[16 of 46 positions shown; findings below may reference images not displayed]

FINDINGS: Lower chest:  No acute findings.  Aortic calcifications.

Hepatobiliary: No mass visualized on this un-enhanced exam. Previous
cholecystectomy. Stable central intrahepatic biliary ductal
dilatation and dilated CBD, tapering to normal caliber at the level
of the ampulla.

Pancreas: No mass or inflammatory process identified on this
un-enhanced exam.

Spleen: Within normal limits in size.

Adrenals/Urinary Tract: No evidence of urolithiasis or
hydronephrosis. No definite mass visualized on this un-enhanced
exam. 3 mm calcification in the mid left renal collecting system.

Stomach/Bowel: No evidence of obstruction, inflammatory process, or
abnormal fluid collections. Appendix surgically absent. Multiple
sigmoid diverticula without adjacent inflammatory/edematous change
or abscess.

Vascular/Lymphatic: No pathologically enlarged lymph nodes.
Aortoiliac arterial calcifications without aneurysm.

Reproductive: No mass or other significant abnormality.

Other: Umbilical hernia containing only mesenteric fat with some
persistent internal stranding, stable since previous exam. No
ascites. No free air. Surgical clip in the lower right pelvis as
before.

Musculoskeletal: Stable lumbar dextroscoliosis with Advanced
degenerative disc disease in the lower lumbar spine
IMPRESSION: 1. No acute abdominal findings.
2. Sigmoid diverticulosis.
3. Left Nephrolithiasis without hydronephrosis.
4. Umbilical hernia containing mesenteric fat.
5. Lumbar scoliosis and degenerative changes.

## 2016-04-15 ENCOUNTER — Ambulatory Visit: Payer: Medicare Other | Admitting: Gastroenterology

## 2016-05-01 ENCOUNTER — Other Ambulatory Visit: Payer: Self-pay | Admitting: *Deleted

## 2016-05-01 MED ORDER — MESALAMINE 1.2 G PO TBEC: 3.6000 g | DELAYED_RELEASE_TABLET | Freq: Every day | ORAL | 2 refills | Status: DC

## 2016-05-09 DIAGNOSIS — F0633 Mood disorder due to known physiological condition with manic features: Secondary | ICD-10-CM | POA: Diagnosis not present

## 2016-06-19 ENCOUNTER — Telehealth: Payer: Self-pay | Admitting: Hematology

## 2016-06-19 NOTE — Telephone Encounter (Signed)
Per YF moved 6/18 lab/fu to 7/12. Spoke with care giver.

## 2016-06-24 ENCOUNTER — Ambulatory Visit: Payer: Medicare Other | Admitting: Hematology

## 2016-06-24 ENCOUNTER — Other Ambulatory Visit: Payer: Medicare Other

## 2016-07-18 ENCOUNTER — Other Ambulatory Visit: Payer: Medicare Other

## 2016-07-18 ENCOUNTER — Ambulatory Visit: Payer: Medicare Other | Admitting: Hematology

## 2016-07-19 ENCOUNTER — Encounter: Payer: Self-pay | Admitting: Family Medicine

## 2016-07-19 ENCOUNTER — Ambulatory Visit (INDEPENDENT_AMBULATORY_CARE_PROVIDER_SITE_OTHER): Payer: Medicare Other | Admitting: Family Medicine

## 2016-07-19 VITALS — BP 120/70 | HR 100 | Temp 99.0°F | Wt 173.3 lb

## 2016-07-19 DIAGNOSIS — R238 Other skin changes: Secondary | ICD-10-CM

## 2016-07-19 NOTE — Progress Notes (Signed)
[name]  Telephone:([phone] Fax:([phone]) [phone]  Clinic Follow Up Note   Patient Care Team: [name], MD as PCP - General (Family Medicine) [name], MD as Consulting Physician (Hematology) [name], MD as Consulting Physician (General Surgery) Gardenia Phlegm, NP as Nurse Practitioner (Hematology and Oncology) 07/22/2016   CHIEF COMPLAINTS:  Left breast cancer   Oncology History   Breast cancer of lower-outer quadrant of left female breast [facility])   Staging form: Breast, AJCC 7th Edition   - Clinical stage from 10/31/2015: Stage IA (T1c, N0, M0) - Signed by [name], MD on 11/19/2015      Breast cancer of lower-outer quadrant of left female breast ([place])   10/23/2015 Mammogram    Diagnostic mammo and [place] shpwed left breast 4:00 complex cystic mass, 4:30 area of shadowing measuring 1.0X1.1X1.2cm      10/31/2015 Initial Diagnosis    Breast cancer of lower-outer quadrant of left female breast ([place])      10/31/2015 Initial Biopsy    Left breast core needle biopsy at 4:30 showed invasive ductal carcinoma and DCIS, , G1-2, 4:00 position biopsy was negative for malignant cells       10/31/2015 Receptors her2    ER 100%+, PR 90%+, HER2-, Ki67 15%      12/08/2015 Pathology Results    Left lumpectomy 12/08/15 Marlou Starks) 1. Breast, lumpectomy, Left - INVASIVE AND IN SITU DUCTAL CARCINOMA, 1.3 CM. - MARGINS NOT INVOLVED. - INVASIVE CARCINOMA FOCALLY 0.1 CM FROM LATERAL MARGIN. - PREVIOUS BIOPSY SITE. - FIBROCYSTIC CHANGES.       01/30/2016 -  Anti-estrogen oral therapy    Adjuvant Anastrozole 1 mg daily       HISTORY OF PRESENTING ILLNESS:  [name] 83 y.o. female with multiple medical comorbidities, including Dementia and bipolar, is here because of her recently diagnosed left breast cancer. She is accompanied by her husband, and her daughter to the clinic today. She was referred by her breast surgeon Dr. Marlou Starks.  This was  discovered by screening mammogram, which showed a complex cystic mass at 4:00, and an area of shadowing measuring 1.2 cm at this for 30 o'clock position of left breast, both lesions were biopsied and the 4:30 o'clock lesion showed DCIS and invasive ductal carcinoma, ER/PR stripe positive, HER-2 negative, Ki-67 15%.  She had prior left breast biopsy for benign lesion, no family history of breast cancer or other malignancy.  She lives with her husband in an independent living facility, she is able to take care of herself, both patient and her husband have dementia, they have a caregiver, who stays with them 8 hours a day.  CURRENT THERAPY: adjuvant Anastrozole 1 mg daily. Started on 01/30/2016  INTERVAL HISTORY: [name] returns for follow up. She is doing well and is accompanied by her care giver and husband. She denies anything new since our last visit. She is taking Anastrozole and denies hot flashes, joint problems or any pain in back or hip. She denies any other concerns.   Per her caregiver, she is eating well and can walk around with her walker. She has around the clock caregivers that work with them at their apartment.    MEDICAL HISTORY:  Past Medical History:  Diagnosis Date  . Bipolar affective ([place])    History of psychosis  . C. difficile diarrhea   . CAD (coronary artery disease)    LAD 30% 2006  . Cerebrovascular disease   . Colonic polyp   .  Crohn's disease ([place])   . Diverticulosis of colon   . Fibromyalgia   . GERD (gastroesophageal reflux disease)   . History of atrial flutter   . History of bronchitis   . History of headache   . History of hyperparathyroidism   . History of tuberculosis   . Hyperlipemia   . Hypertension   . Hypothyroidism   . IBS (irritable bowel syndrome)   . Memory disturbance   . 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 Obesity   . OSA (obstructive sleep apnea)    denies 12/07/15  . Osteopenia   . Other diseases of lung, not elsewhere  classified   . Psychosis   . Radiation 05/2008   5000 cGy to left lower eyelid, parotid lymphatics and upper neck  . Restless legs syndrome (RLS)   . Syncope   . Tuberculosis     SURGICAL HISTORY: Past Surgical History:  Procedure Laterality Date  . APPENDECTOMY    . BLADDER SUSPENSION    . BREAST LUMPECTOMY WITH RADIOACTIVE SEED LOCALIZATION Left 12/08/2015   Procedure: BREAST LUMPECTOMY WITH RADIOACTIVE SEED LOCALIZATION;  Surgeon: Autumn Messing III, MD;  Location: [place];  Service: General;  Laterality: Left;  . CATARACT EXTRACTION Bilateral   . CHOLECYSTECTOMY    . eye lid surgery  04/04/2011   [facility] by Dr. Norlene Duel  . HEEL SPUR EXCISION    . INCISION AND DRAINAGE PERIRECTAL ABSCESS    . LUMBAR LAMINECTOMY    . Moh';s surg left lower eye lid surgery and lid reconstruction surgery  2/10  . NASAL SINUS SURGERY Right    Right maxillary  . Parathyroid adenoma surgery    . POSTERIOR CERVICAL FUSION/FORAMINOTOMY N/A 10/12/2013   Procedure: Cervical five-Thoracic two cervico-thoracic posterior fusion;  Surgeon: [name], MD;  Location: [place] [facility];  Service: Neurosurgery;  Laterality: N/A;  . TONSILLECTOMY AND ADENOIDECTOMY      SOCIAL HISTORY: Social History   Social History  . Marital status: Married    Spouse name: Barnabas Lister  . Number of children: 2  . Years of education: N/A   Occupational History  .  Retired   Social History Main Topics  . Smoking status: Former Smoker    Years: 6.00    Types: Cigarettes    Quit date: 01/08/1960  . Smokeless tobacco: Never Used  . Alcohol use No  . Drug use: No  . Sexual activity: Not on file   Other Topics Concern  . Not on file   Social History Narrative   Patient is married with 2 children   Patient is right handed   Patient is a retired Marine scientist   Patient does not drink any caffeine.    FAMILY HISTORY: Family History  Problem Relation Age of Onset  . Heart failure Father   . Arthritis Mother   . Hypertension Mother   .  Ulcers Mother   . Colon cancer Maternal Grandmother   . Pulmonary embolism Unknown     ALLERGIES:  is allergic to prednisone; carbamazepine; codeine; demerol [meperidine]; diclofenac sodium; fentanyl; fluoxetine hcl; gentamicin; hydromorphone hcl; ibuprofen; indomethacin; lisinopril; meperidine hcl; morphine; pregabalin; propranolol hcl; refresh lacri-lube [eye lubricant]; ropinirole hcl; sulfamethoxazole; white petrolatum-mineral oil  [aquaphilic]; and bacitracin.  MEDICATIONS:  Current Outpatient Prescriptions  Medication Sig Dispense Refill  . amLODipine (NORVASC) 5 MG tablet TAKE 1 TABLET (5 MG TOTAL) BY MOUTH DAILY. 90 tablet 2  . anastrozole (ARIMIDEX) 1 MG tablet Take 1 tablet (1  mg total) by mouth daily. 90 tablet 3  . aspirin 81 MG EC tablet Take 81 mg by mouth.    Marland Kitchen atorvastatin (LIPITOR) 40 MG tablet TAKE 1 TABLET DAILY 90 tablet 1  . Biotin 10 MG TABS Take 10 mg by mouth.    . Calcium 500 MG tablet Take 2 tabs by mouth daily at 6 pm 60 tablet 11  . Cholecalciferol (VITAMIN D3) 2000 units TABS Take 2,000 Units by mouth daily at 12 noon. 30 tablet 11  . dicyclomine (BENTYL) 10 MG capsule Take 1 capsule (10 mg total) by mouth 3 (three) times daily before meals. 20 capsule 0  . donepezil (ARICEPT) 10 MG tablet     . Ergocalciferol (VITAMIN D2) 2000 units TABS Take one tablet daily.    . fluticasone (FLONASE) 50 MCG/ACT nasal spray Place 2 sprays into both nostrils at bedtime. 16 g 2  . levothyroxine (SYNTHROID, LEVOTHROID) 88 MCG tablet Take 1 tablet (88 mcg total) by mouth daily before breakfast. Pt needs an appointment for further refills 30 tablet 2  . lidocaine (LIDODERM) 5 % Frequency:PRN   Dosage:0.0     Instructions:  Note:Dose: 1    . Melatonin 10 MG TABS Take 10 mg by mouth 1 day or 1 dose. (Patient taking differently: Take 10 mg by mouth at bedtime. ) 30 tablet 5  . memantine (NAMENDA) 10 MG tablet TAKE 1 TABLET (10 MG TOTAL) BY MOUTH TWO (TWO) TIMES DAILY. 180 tablet 2  .  mesalamine (LIALDA) 1.2 g EC tablet Take 3 tablets (3.6 g total) by mouth daily with lunch. 90 tablet 2  . Omega-3 Fatty Acids (FISH OIL PO) Take by mouth.    . Propylene Glycol (SYSTANE BALANCE) 0.6 % SOLN Place 1 drop into both eyes daily.    . risperiDONE (RISPERDAL) 1 MG tablet Take 0.5 mg by mouth at bedtime.     . rivaroxaban (XARELTO) 20 MG TABS tablet TAKE 1 TABLET DAILY WITH SUPPER 30 tablet 0  . Simethicone 180 MG CAPS Take 180 mg by mouth.    . vitamin C (ASCORBIC ACID) 500 MG tablet Frequency:QD   Dosage:0.0     Instructions:  Note:Dose: 1     No current facility-administered medications for this visit.     REVIEW OF SYSTEMS:   Constitutional: Denies fevers, chills or abnormal night sweats  Eyes: Denies blurriness of vision, double vision or watery eyes Ears, nose, mouth, throat, and face: Denies mucositis or sore throat Respiratory: Denies cough, dyspnea or wheezes Cardiovascular: Denies palpitation, chest discomfort or lower extremity swelling Gastrointestinal:  Denies nausea, heartburn or change in bowel habits Skin: Denies abnormal skin rashes Lymphatics: Denies new lymphadenopathy or easy bruising Neurological:Denies numbness, tingling or new weaknesses Behavioral/Psych: Mood is stable, no new changes  All other systems were reviewed with the patient and are negative.  PHYSICAL EXAMINATION: ECOG PERFORMANCE STATUS: 2 - Symptomatic, <50% confined to bed  Vitals:   07/22/16 1311  BP: (!) 142/73  Pulse: (!) 110  Resp: 18  Temp: 98.8 F (37.1 C)   Filed Weights   07/22/16 1311  Weight: 173 lb 9.6 oz (78.7 kg)   GENERAL:alert, no distress and comfortable SKIN: skin color, texture, turgor are normal, no rashes or significant lesions EYES: normal, conjunctiva are pink and non-injected, sclera clear (+) b/l discharge  OROPHARYNX:no exudate, no erythema and lips, buccal mucosa, and tongue normal  NECK: supple, thyroid normal size, non-tender, without  nodularity LYMPH:  no palpable lymphadenopathy in  the cervical, axillary or inguinal  LUNGS: clear to auscultation and percussion with normal breathing effort HEART: regular rate & rhythm and no murmurs and no lower extremity edema ABDOMEN:abdomen soft, non-tender and normal bowel sounds Musculoskeletal:no cyanosis of digits and no clubbing  PSYCH: alert & oriented x 3 with fluent speech NEURO: no focal motor/sensory deficits BREAST: She has a surgical incision in the LOQ of the left breast that has healed well. No discharge or skin erythema. No palpable masses in either breast. No nipple discharge or bleeding noted in the bilateral breasts. (+) Skin erythema below left breast from fungal infection  LABORATORY DATA:  I have reviewed the data as listed CBC Latest Ref Rng & Units 07/22/2016 03/18/2016 03/06/2016  WBC 3.9 - 10.3 10e3/uL 9.0 11.3(H) 9.3  Hemoglobin 11.6 - 15.9 g/dL 12.0 12.1 12.3  Hematocrit 34.8 - 46.6 % 37.7 37.9 37.5  Platelets 145 - 400 10e3/uL 238 234 235.0   CMP Latest Ref Rng & Units 07/22/2016 03/18/2016 03/06/2016  Glucose 70 - 140 mg/dl 100 93 113(H)  BUN 7.0 - 26.0 mg/dL 16.1 19.6 26(H)  Creatinine 0.6 - 1.1 mg/dL 0.9 0.9 0.88  Sodium 136 - 145 mEq/L 139 140 137  Potassium 3.5 - 5.1 mEq/L 4.3 4.3 4.1  Chloride 96 - 112 mEq/L - - 102  CO2 22 - 29 mEq/L 26 25 23   Calcium 8.4 - 10.4 mg/dL 9.9 10.0 9.4  Total Protein 6.4 - 8.3 g/dL 7.4 7.1 6.8  Total Bilirubin 0.20 - 1.20 mg/dL 0.64 0.88 0.7  Alkaline Phos 40 - 150 U/L 90 104 135(H)  AST 5 - 34 U/L 14 13 14   ALT 0 - 55 U/L 9 11 23    Pathology report  Diagnosis 12/08/2015 1. Breast, lumpectomy, Left - INVASIVE AND IN SITU DUCTAL CARCINOMA, 1.3 CM. - MARGINS NOT INVOLVED. - INVASIVE CARCINOMA FOCALLY 0.1 CM FROM LATERAL MARGIN. - PREVIOUS BIOPSY SITE. - FIBROCYSTIC CHANGES.  2. Breast, excision, Left additional Deep Margin - BENIGN BREAST TISSUE. - FINAL DEEP MARGIN CLEAR. 3. Breast, excision, Left additional  Medial Margin - BENIGN BREAST TISSUE. - FINAL MEDIAL MARGIN CLEAR. 4. Breast, excision, Left additional Inferior Margin - BENIGN BREAST TISSUE. - FINAL INFERIOR MARGIN CLEAR. Microscopic Comment 1. BREAST, INVASIVE TUMOR, WITHOUT LYMPH NODES PRESENT Specimen, including laterality: Left breast Procedure: Lumpectomy with additional deep, medial, and inferior margins. Histologic type: Ductal. Grade: 1 Tubule formation: 1 Nuclear pleomorphism: 2 Mitotic: 1 Tumor size (gross measurement): 1.3 cm Margins: Invasive, distance to closest margin: 0.1 cm from lateral margin. In-situ, distance to closest margin: 0.3 cm from lateral margin. If margin positive, focally or broadly: N/A Lymphovascular invasion: No 1 of 3 FINAL for Hert, Kenlyn P ([id number]) Microscopic Comment(continued) Ductal carcinoma in situ: Present. Grade: Low grade. Extensive intraductal component: No. Lobular neoplasia: No. Tumor focality: Unifocal Extent of tumor: Skin: N/A Nipple: N/A Skeletal muscle: N/A Breast prognostic profile: Case [id number] Estrogen receptor: 100%, positive, strong staining Progesterone receptor: 90%, positive, strong staining. Her 2 neu: Negative, ratio 1.37. Ki-67: 37% Non-neoplastic breast: Fibrocystic changes. TNM: pT1c, pNX, pMX Comments: The invasive carcinoma is focally less than 0.1 cm from the inferior margin in the lumpectomy specimen (part 1); however, the additional inferior margin specimen (part 4) is benign and therefore the final inferior margin is greater than 0.9 cm. (JDP:gt, 12/11/15) Claudette Laws MD Pathologist, Electronic Signature (Case signed 12/11/2015)  Diagnosis 10/31/2015 1. Breast, left, needle core biopsy, 4:30, mass - INVASIVE DUCTAL CARCINOMA. - DUCTAL CARCINOMA IN SITU. -  SEE COMMENT. 2. Breast, left, needle core biopsy, 4 o'clock, cystic mass - FIBROCYSTIC CHANGES. - THERE IS NO EVIDENCE OF MALIGNANCY. Microscopic Comment 1. The carcinoma appears  grade 1-2. A breast prognostic profile will be performed and the results reported separately. The results were called to [facility] on 11/01/15. (JBK:gt, 11/01/15) Results: IMMUNOHISTOCHEMICAL AND MORPHOMETRIC ANALYSIS PERFORMED MANUALLY Estrogen Receptor: 100%, POSITIVE, STRONG STAINING INTENSITY Progesterone Receptor: 90%, POSITIVE, STRONG STAINING INTENSITY Proliferation Marker Ki67: 15%  Results: HER2 - NEGATIVE RATIO OF HER2/CEP17 SIGNALS 1.37 AVERAGE HER2 COPY NUMBER PER CELL 2.80  RADIOGRAPHIC STUDIES: I have personally reviewed the radiological images as listed and agreed with the findings in the report.  Diagnostic Mammogram 10/23/2015 IMPRESSION: Left breast 4 o'clock complex cystic mass.  Left breast 4:30 o'clock area of shadowing.  ASSESSMENT & PLAN:  83 y.o. Caucasian female, with multiple comorbidities, including hypertension, history of TIA, atrial fibrillation on Xarelto, dementia, bipolar, presented with screening discovered left breast cancer  1. Breast cancer of lower-outer quadrant of left breast, invasive and in situ ductal carcinoma, pT1cN0M0, stage IA, ER+/PR+/HER2- ---The patient had a left lumpectomy on 12/08/15 revealed invasive and in situ ductal carcinoma measuring 1.3 cm with negative margins, the closest margin was 0.1 cm on lateral. I discussed her pathology findings with patient and her daughter. --Given her advanced age and comorbidities, it may be difficult for her to have adjuvant breast radiation, and I think the benefit is limited. The patient saw Dr. Isidore Moos on 11/24/15 who did not recommend radiation given the patient's age and comorbidities -We reviewed the risk of breast cancer recurrence, given her early stage and G1, this is likely low risk disease. --She is on adjuvant anastrozole, tolerating well, plan for total 5 years -Given her advanced age and dementia, if she does experience side effects from anastrozole, I'll stop  it. -We again previously discussed the breast cancer surveillance after her surgery. She will continue annual screening mammogram, and a routine office visit with lab and exam with [place]. -She is clinically doing well, lab results reviewed, exam was unremarkable, no clinical concern for recurrence. - she will be due for mammogram in December, I will order for her   2. HTN, history of TIA, dementia, bipolar, AF -She'll continue follow-up with her primary care physician -She is on Xarelto for HIV fibrillation.    PLAN -Continue Anastrozole 1 mg. I will refill today with 90 pills supply  -Lab and f/u in 6 months. - order mammogram to be done in 12.2018   All questions were answered. The patient knows to call the clinic with any problems, questions or concerns.  I spent 15 minutes counseling the patient face to face. The total time spent in the appointment was 20 minutes and more than 50% was on counseling.  This document serves as a record of services personally performed by [name], MD. It was created on her behalf by Brandt Loosen, a trained medical scribe. The creation of this record is based on the scribe's personal observations and the provider's statements to them. This document has been checked and approved by the attending provider.   I have reviewed the above documentation for accuracy and completeness and I agree with the above.   [name], MD 07/22/2016

## 2016-07-19 NOTE — Progress Notes (Signed)
Subjective:     Patient ID: [name], female   DOB: Nov 20, 1932, 83 y.o.   MRN: [id number]  HPI   Patient resides at [facility] and has advanced dementia. She is brought her today by caregiver with apparently several days of complaints of some bilateral soreness involving both buttocks. She has urine incontinence and generally they change her Depends regularly every 3 hours- though pt has refused for some caregivers.Darral Dash tried Desitin every 3 hours and also tried some Vaseline without good results. She has some diffuse irritation of both buttocks. She also has some stool incontinence history.  Past Medical History:  Diagnosis Date  . Bipolar affective ([place])    History of psychosis  . C. difficile diarrhea   . CAD (coronary artery disease)    LAD 30% 2006  . Cerebrovascular disease   . Colonic polyp   . Crohn's disease ([place])   . Diverticulosis of colon   . Fibromyalgia   . GERD (gastroesophageal reflux disease)   . History of atrial flutter   . History of bronchitis   . History of headache   . History of hyperparathyroidism   . History of tuberculosis   . Hyperlipemia   . Hypertension   . Hypothyroidism   . IBS (irritable bowel syndrome)   . Memory disturbance   . 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 Obesity   . OSA (obstructive sleep apnea)    denies 12/07/15  . Osteopenia   . Other diseases of lung, not elsewhere classified   . Psychosis   . Radiation 05/2008   5000 cGy to left lower eyelid, parotid lymphatics and upper neck  . Restless legs syndrome (RLS)   . Syncope   . Tuberculosis    Past Surgical History:  Procedure Laterality Date  . APPENDECTOMY    . BLADDER SUSPENSION    . BREAST LUMPECTOMY WITH RADIOACTIVE SEED LOCALIZATION Left 12/08/2015   Procedure: BREAST LUMPECTOMY WITH RADIOACTIVE SEED LOCALIZATION;  Surgeon: Autumn Messing III, MD;  Location: [place];  Service: General;  Laterality: Left;  . CATARACT EXTRACTION Bilateral   . CHOLECYSTECTOMY     . eye lid surgery  04/04/2011   [facility] by Dr. Norlene Duel  . HEEL SPUR EXCISION    . INCISION AND DRAINAGE PERIRECTAL ABSCESS    . LUMBAR LAMINECTOMY    . Moh';s surg left lower eye lid surgery and lid reconstruction surgery  2/10  . NASAL SINUS SURGERY Right    Right maxillary  . Parathyroid adenoma surgery    . POSTERIOR CERVICAL FUSION/FORAMINOTOMY N/A 10/12/2013   Procedure: Cervical five-Thoracic two cervico-thoracic posterior fusion;  Surgeon: [name], MD;  Location: [place] [facility];  Service: Neurosurgery;  Laterality: N/A;  . TONSILLECTOMY AND ADENOIDECTOMY      reports that she quit smoking about 56 years ago. Her smoking use included Cigarettes. She quit after 6.00 years of use. She has never used smokeless tobacco. She reports that she does not drink alcohol or use drugs. family history includes Arthritis in her mother; Colon cancer in her maternal grandmother; Heart failure in her father; Hypertension in her mother; Pulmonary embolism in her unknown relative; Ulcers in her mother. Allergies  Allergen Reactions  . Prednisone Other (See Comments)    Patient had psychiatric episode with hallucinations. Caused 3.5 week long hospitalization & loss of memory   . Carbamazepine Nausea Only and Other (See Comments)    Tegretol; dizziness, blurred vision, nausea, headache, insomnia  .  Codeine Nausea And Vomiting  . Demerol [Meperidine] Nausea And Vomiting  . Diclofenac Sodium Other (See Comments)    severe headache  . Fentanyl Nausea And Vomiting and Other (See Comments)    IV Fentanyl; caused nausea and vomiting Fentanyl patch; caused chest pain, HBP, with second patch experienced vertigo and nausea  . Fluoxetine Hcl Nausea Only and Other (See Comments)    dizziness, blurred vision, nausea, headache, insomnia  . Gentamicin     Other reaction(s): EYE IRRITATION  . Hydromorphone Hcl Nausea And Vomiting  . Ibuprofen Other (See Comments)    Caused ulcers and colitis  . Indomethacin  Nausea Only and Other (See Comments)    dizziness, blurred vision, nausea, headache, insomnia  . Lisinopril Cough  . Meperidine Hcl Nausea And Vomiting  . Morphine Nausea And Vomiting  . Pregabalin Other (See Comments)    severe restless legs, insomnia  . Propranolol Hcl Nausea Only and Other (See Comments)    dizziness, blurred vision, nausea, headache, insomnia  . Refresh Lacri-Lube [Eye Lubricant] Swelling    Caused swelling, redness, hard crust on eyelids   . Ropinirole Hcl Other (See Comments)    severe restless legs and insomnia  . Sulfamethoxazole Nausea Only and Other (See Comments)    gas, loose stools  . White Petrolatum-Mineral Oil  [Aquaphilic]     Other reaction(s): SWELLING/EDEMA  . Bacitracin Hives, Itching and Rash    redness     Review of Systems  Constitutional: Negative for chills and fever.  limited hx obtainable from patient as she has advanced dementia.       Objective:   Physical Exam  Constitutional: She appears well-developed and well-nourished.  Cardiovascular: Normal rate and regular rhythm.   Pulmonary/Chest: Effort normal and breath sounds normal. No respiratory distress. She has no wheezes. She has no rales.  Skin:  Patient has diffuse erythema of both buttocks. She has no visible discrete ulcerations but very diffuse erythema and very symmetric. She has some evidence for stool incontinence with some soiling around the anal region       Assessment:     Bilateral buttock irritation from urine and stool incontinence. Also compounded by the fact that she does not ambulate much. No evidence for stage II involvement at this time    Plan:     -May benefit from thin film protection -Set up home health nursing with wound care specialist/skin specialist -Continue to change her wet depends frequently -Encourage getting up to ambulate as much as possible and changing positions frequently  [name] MD [facility]

## 2016-07-22 ENCOUNTER — Telehealth: Payer: Self-pay | Admitting: Hematology

## 2016-07-22 ENCOUNTER — Ambulatory Visit (HOSPITAL_BASED_OUTPATIENT_CLINIC_OR_DEPARTMENT_OTHER): Payer: Medicare Other | Admitting: Hematology

## 2016-07-22 ENCOUNTER — Other Ambulatory Visit (HOSPITAL_BASED_OUTPATIENT_CLINIC_OR_DEPARTMENT_OTHER): Payer: Medicare Other

## 2016-07-22 ENCOUNTER — Encounter: Payer: Self-pay | Admitting: Hematology

## 2016-07-22 VITALS — BP 142/73 | HR 110 | Temp 98.8°F | Resp 18 | Ht 64.0 in | Wt 173.6 lb

## 2016-07-22 DIAGNOSIS — Z170 Estrogen receptor positive status [ER+]: Principal | ICD-10-CM

## 2016-07-22 DIAGNOSIS — C50512 Malignant neoplasm of lower-outer quadrant of left female breast: Secondary | ICD-10-CM | POA: Diagnosis not present

## 2016-07-22 DIAGNOSIS — F0390 Unspecified dementia without behavioral disturbance: Secondary | ICD-10-CM

## 2016-07-22 DIAGNOSIS — I10 Essential (primary) hypertension: Secondary | ICD-10-CM | POA: Diagnosis not present

## 2016-07-22 DIAGNOSIS — F319 Bipolar disorder, unspecified: Secondary | ICD-10-CM

## 2016-07-22 DIAGNOSIS — I4891 Unspecified atrial fibrillation: Secondary | ICD-10-CM

## 2016-07-22 LAB — CBC WITH DIFFERENTIAL/PLATELET
BASO%: 0.6 % (ref 0.0–2.0)
Basophils Absolute: 0.1 10*3/uL (ref 0.0–0.1)
EOS ABS: 0.1 10*3/uL (ref 0.0–0.5)
EOS%: 1.6 % (ref 0.0–7.0)
HCT: 37.7 % (ref 34.8–46.6)
HGB: 12 g/dL (ref 11.6–15.9)
LYMPH%: 42.8 % (ref 14.0–49.7)
MCH: 27.6 pg (ref 25.1–34.0)
MCHC: 31.8 g/dL (ref 31.5–36.0)
MCV: 86.7 fL (ref 79.5–101.0)
MONO#: 0.7 10*3/uL (ref 0.1–0.9)
MONO%: 8.1 % (ref 0.0–14.0)
NEUT%: 46.9 % (ref 38.4–76.8)
NEUTROS ABS: 4.2 10*3/uL (ref 1.5–6.5)
Platelets: 238 10*3/uL (ref 145–400)
RBC: 4.35 10*6/uL (ref 3.70–5.45)
RDW: 15.7 % — ABNORMAL HIGH (ref 11.2–14.5)
WBC: 9 10*3/uL (ref 3.9–10.3)
lymph#: 3.9 10*3/uL — ABNORMAL HIGH (ref 0.9–3.3)

## 2016-07-22 LAB — COMPREHENSIVE METABOLIC PANEL
ALT: 9 U/L (ref 0–55)
AST: 14 U/L (ref 5–34)
Albumin: 3.7 g/dL (ref 3.5–5.0)
Alkaline Phosphatase: 90 U/L (ref 40–150)
Anion Gap: 10 mEq/L (ref 3–11)
BILIRUBIN TOTAL: 0.64 mg/dL (ref 0.20–1.20)
BUN: 16.1 mg/dL (ref 7.0–26.0)
CO2: 26 meq/L (ref 22–29)
Calcium: 9.9 mg/dL (ref 8.4–10.4)
Chloride: 103 mEq/L (ref 98–109)
Creatinine: 0.9 mg/dL (ref 0.6–1.1)
EGFR: 61 mL/min/{1.73_m2} — AB (ref >90)
GLUCOSE: 100 mg/dL (ref 70–140)
POTASSIUM: 4.3 meq/L (ref 3.5–5.1)
SODIUM: 139 meq/L (ref 136–145)
TOTAL PROTEIN: 7.4 g/dL (ref 6.4–8.3)

## 2016-07-22 MED ORDER — ANASTROZOLE 1 MG PO TABS: 1.0000 mg | ORAL_TABLET | Freq: Every day | ORAL | 3 refills | Status: AC

## 2016-07-22 NOTE — Telephone Encounter (Signed)
Not able to reach patient/family at numbers listed in EPIC. Schedule mailed, with information re contacting [facility] to arrange annual mammo.

## 2016-07-23 ENCOUNTER — Telehealth: Payer: Self-pay | Admitting: Family Medicine

## 2016-07-23 NOTE — Telephone Encounter (Signed)
Just an FYI-Kecia called from [facility] to notify a nurse is going to see pt tomorrow for home health.

## 2016-07-23 NOTE — Telephone Encounter (Signed)
Noted  

## 2016-07-26 ENCOUNTER — Telehealth: Payer: Self-pay | Admitting: Family Medicine

## 2016-07-26 NOTE — Telephone Encounter (Signed)
[name] with [facility] called to let Dr. [name] know that a nurse will be going out Sunday to the pts home to start care for the pt.

## 2016-07-27 DIAGNOSIS — I2510 Atherosclerotic heart disease of native coronary artery without angina pectoris: Secondary | ICD-10-CM | POA: Diagnosis not present

## 2016-07-27 DIAGNOSIS — L89311 Pressure ulcer of right buttock, stage 1: Secondary | ICD-10-CM | POA: Diagnosis not present

## 2016-07-27 DIAGNOSIS — I10 Essential (primary) hypertension: Secondary | ICD-10-CM | POA: Diagnosis not present

## 2016-07-27 DIAGNOSIS — L89321 Pressure ulcer of left buttock, stage 1: Secondary | ICD-10-CM | POA: Diagnosis not present

## 2016-07-27 DIAGNOSIS — F319 Bipolar disorder, unspecified: Secondary | ICD-10-CM | POA: Diagnosis not present

## 2016-07-27 DIAGNOSIS — F0391 Unspecified dementia with behavioral disturbance: Secondary | ICD-10-CM | POA: Diagnosis not present

## 2016-07-30 ENCOUNTER — Telehealth: Payer: Self-pay | Admitting: Family Medicine

## 2016-07-30 DIAGNOSIS — L89321 Pressure ulcer of left buttock, stage 1: Secondary | ICD-10-CM | POA: Diagnosis not present

## 2016-07-30 DIAGNOSIS — I10 Essential (primary) hypertension: Secondary | ICD-10-CM | POA: Diagnosis not present

## 2016-07-30 DIAGNOSIS — F0391 Unspecified dementia with behavioral disturbance: Secondary | ICD-10-CM | POA: Diagnosis not present

## 2016-07-30 DIAGNOSIS — F319 Bipolar disorder, unspecified: Secondary | ICD-10-CM | POA: Diagnosis not present

## 2016-07-30 DIAGNOSIS — L89311 Pressure ulcer of right buttock, stage 1: Secondary | ICD-10-CM | POA: Diagnosis not present

## 2016-07-30 DIAGNOSIS — I2510 Atherosclerotic heart disease of native coronary artery without angina pectoris: Secondary | ICD-10-CM | POA: Diagnosis not present

## 2016-07-30 NOTE — Telephone Encounter (Signed)
Verbal orders given to Amanda.  

## 2016-07-30 NOTE — Telephone Encounter (Signed)
° ° ° °  [name] with [facility] call to ask for verbal orders for nursing to see her 2 times a week for 1 week and then 1 time a week for 6 weeks      [phone]

## 2016-07-30 NOTE — Telephone Encounter (Signed)
OK 

## 2016-08-01 ENCOUNTER — Other Ambulatory Visit (HOSPITAL_COMMUNITY): Payer: Self-pay | Admitting: Psychiatry

## 2016-08-02 DIAGNOSIS — F0391 Unspecified dementia with behavioral disturbance: Secondary | ICD-10-CM | POA: Diagnosis not present

## 2016-08-02 DIAGNOSIS — I2510 Atherosclerotic heart disease of native coronary artery without angina pectoris: Secondary | ICD-10-CM | POA: Diagnosis not present

## 2016-08-02 DIAGNOSIS — I10 Essential (primary) hypertension: Secondary | ICD-10-CM | POA: Diagnosis not present

## 2016-08-02 DIAGNOSIS — F319 Bipolar disorder, unspecified: Secondary | ICD-10-CM | POA: Diagnosis not present

## 2016-08-02 DIAGNOSIS — L89321 Pressure ulcer of left buttock, stage 1: Secondary | ICD-10-CM | POA: Diagnosis not present

## 2016-08-02 DIAGNOSIS — L89311 Pressure ulcer of right buttock, stage 1: Secondary | ICD-10-CM | POA: Diagnosis not present

## 2016-08-07 DIAGNOSIS — L89311 Pressure ulcer of right buttock, stage 1: Secondary | ICD-10-CM | POA: Diagnosis not present

## 2016-08-07 DIAGNOSIS — I2510 Atherosclerotic heart disease of native coronary artery without angina pectoris: Secondary | ICD-10-CM | POA: Diagnosis not present

## 2016-08-07 DIAGNOSIS — I10 Essential (primary) hypertension: Secondary | ICD-10-CM | POA: Diagnosis not present

## 2016-08-07 DIAGNOSIS — L89321 Pressure ulcer of left buttock, stage 1: Secondary | ICD-10-CM | POA: Diagnosis not present

## 2016-08-07 DIAGNOSIS — F319 Bipolar disorder, unspecified: Secondary | ICD-10-CM | POA: Diagnosis not present

## 2016-08-07 DIAGNOSIS — F0391 Unspecified dementia with behavioral disturbance: Secondary | ICD-10-CM | POA: Diagnosis not present

## 2016-08-21 ENCOUNTER — Telehealth: Payer: Self-pay | Admitting: Family Medicine

## 2016-08-21 DIAGNOSIS — L89311 Pressure ulcer of right buttock, stage 1: Secondary | ICD-10-CM | POA: Diagnosis not present

## 2016-08-21 DIAGNOSIS — I10 Essential (primary) hypertension: Secondary | ICD-10-CM | POA: Diagnosis not present

## 2016-08-21 DIAGNOSIS — L89321 Pressure ulcer of left buttock, stage 1: Secondary | ICD-10-CM | POA: Diagnosis not present

## 2016-08-21 DIAGNOSIS — I2510 Atherosclerotic heart disease of native coronary artery without angina pectoris: Secondary | ICD-10-CM | POA: Diagnosis not present

## 2016-08-21 DIAGNOSIS — F0391 Unspecified dementia with behavioral disturbance: Secondary | ICD-10-CM | POA: Diagnosis not present

## 2016-08-21 DIAGNOSIS — F319 Bipolar disorder, unspecified: Secondary | ICD-10-CM | POA: Diagnosis not present

## 2016-08-21 NOTE — Telephone Encounter (Signed)
Pt POA michelle Greek signed medical records release for 05/07/16 to presenst. Call when ready [phone].

## 2016-08-28 DIAGNOSIS — I10 Essential (primary) hypertension: Secondary | ICD-10-CM | POA: Diagnosis not present

## 2016-08-28 DIAGNOSIS — F319 Bipolar disorder, unspecified: Secondary | ICD-10-CM | POA: Diagnosis not present

## 2016-08-28 DIAGNOSIS — L89311 Pressure ulcer of right buttock, stage 1: Secondary | ICD-10-CM | POA: Diagnosis not present

## 2016-08-28 DIAGNOSIS — L89321 Pressure ulcer of left buttock, stage 1: Secondary | ICD-10-CM | POA: Diagnosis not present

## 2016-08-28 DIAGNOSIS — I2510 Atherosclerotic heart disease of native coronary artery without angina pectoris: Secondary | ICD-10-CM | POA: Diagnosis not present

## 2016-08-28 DIAGNOSIS — F0391 Unspecified dementia with behavioral disturbance: Secondary | ICD-10-CM | POA: Diagnosis not present

## 2016-09-04 ENCOUNTER — Other Ambulatory Visit: Payer: Self-pay | Admitting: Family Medicine

## 2016-09-04 DIAGNOSIS — I10 Essential (primary) hypertension: Secondary | ICD-10-CM | POA: Diagnosis not present

## 2016-09-04 DIAGNOSIS — L89311 Pressure ulcer of right buttock, stage 1: Secondary | ICD-10-CM | POA: Diagnosis not present

## 2016-09-04 DIAGNOSIS — I2510 Atherosclerotic heart disease of native coronary artery without angina pectoris: Secondary | ICD-10-CM | POA: Diagnosis not present

## 2016-09-04 DIAGNOSIS — F319 Bipolar disorder, unspecified: Secondary | ICD-10-CM | POA: Diagnosis not present

## 2016-09-04 DIAGNOSIS — L89321 Pressure ulcer of left buttock, stage 1: Secondary | ICD-10-CM | POA: Diagnosis not present

## 2016-09-04 DIAGNOSIS — F0391 Unspecified dementia with behavioral disturbance: Secondary | ICD-10-CM | POA: Diagnosis not present

## 2016-09-10 ENCOUNTER — Other Ambulatory Visit: Payer: Self-pay | Admitting: Family Medicine

## 2016-09-11 DIAGNOSIS — L89311 Pressure ulcer of right buttock, stage 1: Secondary | ICD-10-CM | POA: Diagnosis not present

## 2016-09-11 DIAGNOSIS — L89321 Pressure ulcer of left buttock, stage 1: Secondary | ICD-10-CM | POA: Diagnosis not present

## 2016-09-11 DIAGNOSIS — F0391 Unspecified dementia with behavioral disturbance: Secondary | ICD-10-CM | POA: Diagnosis not present

## 2016-09-11 DIAGNOSIS — I10 Essential (primary) hypertension: Secondary | ICD-10-CM | POA: Diagnosis not present

## 2016-09-11 DIAGNOSIS — I2510 Atherosclerotic heart disease of native coronary artery without angina pectoris: Secondary | ICD-10-CM | POA: Diagnosis not present

## 2016-09-11 DIAGNOSIS — F319 Bipolar disorder, unspecified: Secondary | ICD-10-CM | POA: Diagnosis not present

## 2016-09-12 ENCOUNTER — Other Ambulatory Visit: Payer: Self-pay | Admitting: *Deleted

## 2016-09-12 MED ORDER — MESALAMINE 1.2 G PO TBEC: 3.6000 g | DELAYED_RELEASE_TABLET | Freq: Every day | ORAL | 2 refills | Status: DC

## 2016-09-26 ENCOUNTER — Encounter: Payer: Self-pay | Admitting: Family Medicine

## 2016-10-08 ENCOUNTER — Telehealth: Payer: Self-pay

## 2016-10-08 DIAGNOSIS — F0633 Mood disorder due to known physiological condition with manic features: Secondary | ICD-10-CM | POA: Diagnosis not present

## 2016-10-08 NOTE — Telephone Encounter (Signed)
Pending. Key:  [id number]

## 2016-10-08 NOTE — Telephone Encounter (Signed)
Pharmacy called to advise that Lialda is requiring PA, even for generic. They have started a PA on CoverMyMeds and have faxed the information to the office.  Pt is out of medication.  Sarah - Please follow up. Thanks!

## 2016-10-08 NOTE — Telephone Encounter (Signed)
Insurance will cover the brand name - but per pharmacy the medication is $800. Insurance company will not cover generic just because the brand is too expensive. They will need more reasoning as to why patient cannot have the brand name.

## 2016-10-08 NOTE — Telephone Encounter (Signed)
We did not prescribe the Lialda.  This was prescribed by GI and I recommend this be addressed by prescribing provider.

## 2016-10-09 MED ORDER — MESALAMINE 1.2 G PO TBEC: 3.6000 g | DELAYED_RELEASE_TABLET | Freq: Every day | ORAL | 2 refills | Status: DC

## 2016-10-09 NOTE — Telephone Encounter (Signed)
[facility] is using [facility]

## 2016-10-09 NOTE — Telephone Encounter (Signed)
Rx sent 

## 2016-10-09 NOTE — Telephone Encounter (Signed)
Yes.  She has advanced dementia and would minimize follow up as much as possible for her.

## 2016-10-09 NOTE — Telephone Encounter (Signed)
Spoke with pharmacist and Rx would be approved if sent to mail order.  Left message on machine for Anderson Malta to see if the patient wants the prescription and which mail order to use?

## 2016-10-09 NOTE — Telephone Encounter (Signed)
Okay to send refill to mail order?

## 2016-10-16 ENCOUNTER — Telehealth: Payer: Self-pay | Admitting: Family Medicine

## 2016-10-16 NOTE — Telephone Encounter (Signed)
mesalamine (LIALDA) 1.2 g EC tablet  $880 through pharmacy  needs sent to mail order  The nurse Stoney Bang) left before I can find out which mail order pharmacy.  Danise Mina was wanting a copy of both Mr & Mrs Novick's medication list sent with the nurse and I was not able to give her that before she left with Mr. Janowski.  Her Psychiatrist Berneta Sages Plovsky) stopped her risperiDONE (RISPERDAL) 1 MG tablet    The nurse Stoney Bang)  was wondering if you would be able to start her back on this medication. Her behavior was a lot better when she was on this medication.   Please Advise

## 2016-10-22 NOTE — Telephone Encounter (Signed)
Rx sent to [facility]

## 2016-10-29 ENCOUNTER — Encounter: Payer: Self-pay | Admitting: Family Medicine

## 2016-10-29 ENCOUNTER — Ambulatory Visit (INDEPENDENT_AMBULATORY_CARE_PROVIDER_SITE_OTHER): Payer: Medicare Other | Admitting: Family Medicine

## 2016-10-29 VITALS — BP 110/80 | HR 84 | Temp 98.5°F | Wt 175.5 lb

## 2016-10-29 DIAGNOSIS — E038 Other specified hypothyroidism: Secondary | ICD-10-CM | POA: Diagnosis not present

## 2016-10-29 DIAGNOSIS — I2510 Atherosclerotic heart disease of native coronary artery without angina pectoris: Secondary | ICD-10-CM | POA: Diagnosis not present

## 2016-10-29 DIAGNOSIS — I10 Essential (primary) hypertension: Secondary | ICD-10-CM

## 2016-10-29 DIAGNOSIS — Z23 Encounter for immunization: Secondary | ICD-10-CM | POA: Diagnosis not present

## 2016-10-29 NOTE — Progress Notes (Signed)
Subjective:     Patient ID: [name], female   DOB: 24-Feb-1932, 83 y.o.   MRN: [id number]  HPI Patient seen for medical follow-up. She has advanced dementia and history is very limited from patient. She has multiple chronic problems including history of atrial flutter, hypertension, cerebrovascular disease, coronary artery disease, obstructive sleep apnea, regional enteritis, irritable bowel syndrome, GERD, hypothyroidism, osteoarthritis, restless leg syndrome. She is followed by psychiatrist and they apparently been tapering back her Risperdal and staff has been concerned about this because of her past history of agitation. They think she is doing very well on Risperdal.  All medications reviewed and compliant with all. No recent side effects.  Appetite and weight have been stable according to staff  Ambulates with a walker. No recent falls. Still needs flu vaccine.  Past Medical History:  Diagnosis Date  . Bipolar affective ([place])    History of psychosis  . C. difficile diarrhea   . CAD (coronary artery disease)    LAD 30% 2006  . Cerebrovascular disease   . Colonic polyp   . Crohn's disease ([place])   . Diverticulosis of colon   . Fibromyalgia   . GERD (gastroesophageal reflux disease)   . History of atrial flutter   . History of bronchitis   . History of headache   . History of hyperparathyroidism   . History of tuberculosis   . Hyperlipemia   . Hypertension   . Hypothyroidism   . IBS (irritable bowel syndrome)   . Memory disturbance   . 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 Obesity   . OSA (obstructive sleep apnea)    denies 12/07/15  . Osteopenia   . Other diseases of lung, not elsewhere classified   . Psychosis ([place])   . Radiation 05/2008   5000 cGy to left lower eyelid, parotid lymphatics and upper neck  . Restless legs syndrome (RLS)   . Syncope   . Tuberculosis    Past Surgical History:  Procedure Laterality Date  . APPENDECTOMY    . BLADDER SUSPENSION    .  BREAST LUMPECTOMY WITH RADIOACTIVE SEED LOCALIZATION Left 12/08/2015   Procedure: BREAST LUMPECTOMY WITH RADIOACTIVE SEED LOCALIZATION;  Surgeon: Autumn Messing III, MD;  Location: [facility];  Service: General;  Laterality: Left;  . CATARACT EXTRACTION Bilateral   . CHOLECYSTECTOMY    . eye lid surgery  04/04/2011   [facility] by Dr. Norlene Duel  . HEEL SPUR EXCISION    . INCISION AND DRAINAGE PERIRECTAL ABSCESS    . LUMBAR LAMINECTOMY    . Moh';s surg left lower eye lid surgery and lid reconstruction surgery  2/10  . NASAL SINUS SURGERY Right    Right maxillary  . Parathyroid adenoma surgery    . POSTERIOR CERVICAL FUSION/FORAMINOTOMY N/A 10/12/2013   Procedure: Cervical five-Thoracic two cervico-thoracic posterior fusion;  Surgeon: [name], MD;  Location: [facility];  Service: Neurosurgery;  Laterality: N/A;  . TONSILLECTOMY AND ADENOIDECTOMY      reports that she quit smoking about 56 years ago. Her smoking use included Cigarettes. She quit after 6.00 years of use. She has never used smokeless tobacco. She reports that she does not drink alcohol or use drugs. family history includes Arthritis in her mother; Colon cancer in her maternal grandmother; Heart failure in her father; Hypertension in her mother; Pulmonary embolism in her unknown relative; Ulcers in her mother. Allergies  Allergen Reactions  . Prednisone Other (See Comments)  Patient had psychiatric episode with hallucinations. Caused 3.5 week long hospitalization & loss of memory   . Carbamazepine Nausea Only and Other (See Comments)    Tegretol; dizziness, blurred vision, nausea, headache, insomnia  . Codeine Nausea And Vomiting  . Demerol [Meperidine] Nausea And Vomiting  . Diclofenac Sodium Other (See Comments)    severe headache  . Fentanyl Nausea And Vomiting and Other (See Comments)    IV Fentanyl; caused nausea and vomiting Fentanyl patch; caused chest pain, HBP, with second patch experienced vertigo and nausea  . Fluoxetine  Hcl Nausea Only and Other (See Comments)    dizziness, blurred vision, nausea, headache, insomnia  . Gentamicin     Other reaction(s): EYE IRRITATION  . Hydromorphone Hcl Nausea And Vomiting  . Ibuprofen Other (See Comments)    Caused ulcers and colitis  . Indomethacin Nausea Only and Other (See Comments)    dizziness, blurred vision, nausea, headache, insomnia  . Lisinopril Cough  . Meperidine Hcl Nausea And Vomiting  . Morphine Nausea And Vomiting  . Pregabalin Other (See Comments)    severe restless legs, insomnia  . Propranolol Hcl Nausea Only and Other (See Comments)    dizziness, blurred vision, nausea, headache, insomnia  . Refresh Lacri-Lube [Eye Lubricant] Swelling    Caused swelling, redness, hard crust on eyelids   . Ropinirole Hcl Other (See Comments)    severe restless legs and insomnia  . Sulfamethoxazole Nausea Only and Other (See Comments)    gas, loose stools  . White Petrolatum-Mineral Oil  [Aquaphilic]     Other reaction(s): SWELLING/EDEMA  . Bacitracin Hives, Itching and Rash    redness       Review of Systems  Constitutional: Negative for appetite change, chills, fever and unexpected weight change.  Respiratory: Negative for shortness of breath.   Cardiovascular: Negative for chest pain.  Gastrointestinal: Negative for abdominal pain.  Genitourinary: Negative for dysuria.  Psychiatric/Behavioral: Negative for agitation.       Objective:   Physical Exam  Constitutional: She appears well-developed and well-nourished.  Neck: Neck supple. No thyromegaly present.  Cardiovascular: Normal rate and regular rhythm.   Pulmonary/Chest: Effort normal and breath sounds normal. No respiratory distress. She has no wheezes. She has no rales.  Musculoskeletal: She exhibits no edema.  Lymphadenopathy:    She has no cervical adenopathy.  Neurological: She is alert.       Assessment:     #1 advanced dementia with history of behavioral disturbance currently  stable  #2 hypertension stable and at goal  #3 dyslipidemia and history of CAD  #4 hypothyroidism  #5 history of atrial flutter/fibrillation    Plan:     -Flu vaccine given -Check labs with lipid panel, hepatic panel, TSH -Continue current medications. Recommend routine follow-up in 6 months and sooner as needed  [name] MD [facility]

## 2016-10-31 ENCOUNTER — Other Ambulatory Visit: Payer: Self-pay | Admitting: Family Medicine

## 2016-11-19 ENCOUNTER — Telehealth: Payer: Self-pay | Admitting: Family Medicine

## 2016-11-19 DIAGNOSIS — I4891 Unspecified atrial fibrillation: Secondary | ICD-10-CM

## 2016-11-19 NOTE — Telephone Encounter (Signed)
Left message on machine for [name] to return our call.

## 2016-11-19 NOTE — Telephone Encounter (Signed)
She should be on Xarelto May refill Risperdal Can we get someone to call regarding the Lialda-  This was prescribed per GI.

## 2016-11-19 NOTE — Telephone Encounter (Signed)
[name]  [phone]  1- Xarelto 20 mg - Is this a current medication for patient?  The chart list it as a 03/2016 with no refills- but patient has been taking it daily and needs refills if it is a maintenance medication.  2- Lialda- they are having trouble getting Rx from [facility]- can office call and verify Rx and make sure they are releasing it to: [place] [id number].  Note: medications are coming from [facility]- can medications be sent through one mail order for medication refill convienence?  3- Risperidone 1 mg - Patient has been released back to primary care from phychiatric care. They would like her to continue her medication. She takes 1/2 tablet at night. Can that be sent to her pharmacy.

## 2016-11-19 NOTE — Telephone Encounter (Signed)
Copied from [place] [phone]. Topic: Quick Communication - See Telephone Encounter >> Nov 19, 2016  9:52 AM Carolyn Stare wrote: CRM for notification. See Telephone encounter for:  11/19/16. CRM for notification. See Telephone encounter for: Justin Mend with [facility] call to ask if Dr [name] can refill the following med RISPERGAL MED WAS RX BY HER PSY and he no longer need to see her . Also req rx liada 1.2 mg xalrito 76m JAnderson Maltais  requesting that RApolonio Schneiderscall her back about these meds  [phone]

## 2016-11-20 MED ORDER — RISPERIDONE 1 MG PO TABS: 0.5000 mg | ORAL_TABLET | Freq: Every day | ORAL | 1 refills | Status: AC

## 2016-11-20 MED ORDER — RIVAROXABAN 20 MG PO TABS: ORAL_TABLET | ORAL | 1 refills | Status: DC

## 2016-11-20 NOTE — Telephone Encounter (Signed)
Anderson Malta is returning [name] call please call [phone]

## 2016-11-20 NOTE — Telephone Encounter (Signed)
Spoke with Anderson Malta and refill sent.  Med list was also faxed to [facility] for review.

## 2016-11-20 NOTE — Addendum Note (Signed)
Addended by: Westley Hummer B on: 11/20/2016 11:27 AM   Modules accepted: Orders

## 2016-12-26 ENCOUNTER — Ambulatory Visit: Payer: Medicare Other | Admitting: Family Medicine

## 2016-12-27 ENCOUNTER — Encounter: Payer: Self-pay | Admitting: Family Medicine

## 2016-12-27 ENCOUNTER — Ambulatory Visit: Payer: Medicare Other | Admitting: Family Medicine

## 2016-12-27 ENCOUNTER — Ambulatory Visit (INDEPENDENT_AMBULATORY_CARE_PROVIDER_SITE_OTHER): Payer: Medicare Other | Admitting: Family Medicine

## 2016-12-27 VITALS — BP 124/50 | HR 94 | Temp 98.2°F | Ht 64.0 in | Wt 178.5 lb

## 2016-12-27 DIAGNOSIS — L989 Disorder of the skin and subcutaneous tissue, unspecified: Secondary | ICD-10-CM

## 2016-12-27 DIAGNOSIS — I2510 Atherosclerotic heart disease of native coronary artery without angina pectoris: Secondary | ICD-10-CM

## 2016-12-27 DIAGNOSIS — L03319 Cellulitis of trunk, unspecified: Secondary | ICD-10-CM | POA: Diagnosis not present

## 2016-12-27 MED ORDER — DOXYCYCLINE HYCLATE 100 MG PO TABS: 100.0000 mg | ORAL_TABLET | Freq: Two times a day (BID) | ORAL | 0 refills | Status: DC

## 2016-12-27 NOTE — Patient Instructions (Signed)
Follow-up with your doctor next week for recheck.  Take the antibiotic as instructed.  Warm compresses several times a day.  I hope you are feeling better soon! Seek care sooner if your symptoms worsen or new concerns arise.

## 2016-12-27 NOTE — Addendum Note (Signed)
Addended by: Agnes Lawrence on: 12/27/2016 01:19 PM   Modules accepted: Orders

## 2016-12-27 NOTE — Progress Notes (Signed)
HPI:  Acute visit for a bump on her chest: -Noticed a red bump on her chest the last 2 days -Does not think she had any red lesion or lesion here past, but she has a lot of SKs on her chest -Mildly tender -Small amount of drainage yesterday -No fevers, chills, malaise or significant spreading of the redness -She has a very long list of medication allergies and reports she cannot use topical antibiotics as is allergic to them  ROS: See pertinent positives and negatives per HPI.  Past Medical History:  Diagnosis Date  . Bipolar affective ([place])    History of psychosis  . C. difficile diarrhea   . CAD (coronary artery disease)    LAD 30% 2006  . Cerebrovascular disease   . Colonic polyp   . Crohn's disease ([place])   . Diverticulosis of colon   . Fibromyalgia   . GERD (gastroesophageal reflux disease)   . History of atrial flutter   . History of bronchitis   . History of headache   . History of hyperparathyroidism   . History of tuberculosis   . Hyperlipemia   . Hypertension   . Hypothyroidism   . IBS (irritable bowel syndrome)   . Memory disturbance   . 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 Obesity   . OSA (obstructive sleep apnea)    denies 12/07/15  . Osteopenia   . Other diseases of lung, not elsewhere classified   . Psychosis ([place])   . Radiation 05/2008   5000 cGy to left lower eyelid, parotid lymphatics and upper neck  . Restless legs syndrome (RLS)   . Syncope   . Tuberculosis     Past Surgical History:  Procedure Laterality Date  . APPENDECTOMY    . BLADDER SUSPENSION    . BREAST LUMPECTOMY WITH RADIOACTIVE SEED LOCALIZATION Left 12/08/2015   Procedure: BREAST LUMPECTOMY WITH RADIOACTIVE SEED LOCALIZATION;  Surgeon: Autumn Messing III, MD;  Location: [facility];  Service: General;  Laterality: Left;  . CATARACT EXTRACTION Bilateral   . CHOLECYSTECTOMY    . eye lid surgery  04/04/2011   [facility] by Dr. Norlene Duel  . HEEL SPUR EXCISION    . INCISION AND DRAINAGE  PERIRECTAL ABSCESS    . LUMBAR LAMINECTOMY    . Moh';s surg left lower eye lid surgery and lid reconstruction surgery  2/10  . NASAL SINUS SURGERY Right    Right maxillary  . Parathyroid adenoma surgery    . POSTERIOR CERVICAL FUSION/FORAMINOTOMY N/A 10/12/2013   Procedure: Cervical five-Thoracic two cervico-thoracic posterior fusion;  Surgeon: [name], MD;  Location: [facility];  Service: Neurosurgery;  Laterality: N/A;  . TONSILLECTOMY AND ADENOIDECTOMY      Family History  Problem Relation Age of Onset  . Heart failure Father   . Arthritis Mother   . Hypertension Mother   . Ulcers Mother   . Colon cancer Maternal Grandmother   . Pulmonary embolism Unknown     Social History   Socioeconomic History  . Marital status: Married    Spouse name: Barnabas Lister  . Number of children: 2  . Years of education: None  . Highest education level: None  Social Needs  . Financial resource strain: None  . Food insecurity - worry: None  . Food insecurity - inability: None  . Transportation needs - medical: None  . Transportation needs - non-medical: None  Occupational History    Employer: RETIRED  Tobacco Use  .  Smoking status: Former Smoker    Years: 6.00    Types: Cigarettes    Last attempt to quit: 01/08/1960    Years since quitting: 57.0  . Smokeless tobacco: Never Used  Substance and Sexual Activity  . Alcohol use: No  . Drug use: No  . Sexual activity: None  Other Topics Concern  . None  Social History Narrative   Patient is married with 2 children   Patient is right handed   Patient is a retired Marine scientist   Patient does not drink any caffeine.     Current Outpatient Medications:  .  amLODipine (NORVASC) 5 MG tablet, TAKE 1 TABLET (5 MG TOTAL) BY MOUTH DAILY., Disp: 90 tablet, Rfl: 2 .  anastrozole (ARIMIDEX) 1 MG tablet, Take 1 tablet (1 mg total) by mouth daily., Disp: 90 tablet, Rfl: 3 .  aspirin 81 MG EC tablet, Take 81 mg by mouth., Disp: , Rfl:  .  atorvastatin  (LIPITOR) 40 MG tablet, TAKE 1 TABLET DAILY, Disp: 90 tablet, Rfl: 1 .  Biotin 10 MG TABS, Take 10 mg by mouth., Disp: , Rfl:  .  Calcium 500 MG tablet, Take 2 tabs by mouth daily at 6 pm, Disp: 60 tablet, Rfl: 11 .  Cholecalciferol (VITAMIN D3) 2000 units TABS, Take 2,000 Units by mouth daily at 12 noon., Disp: 30 tablet, Rfl: 11 .  dicyclomine (BENTYL) 10 MG capsule, Take 1 capsule (10 mg total) by mouth 3 (three) times daily before meals., Disp: 20 capsule, Rfl: 0 .  donepezil (ARICEPT) 10 MG tablet, , Disp: , Rfl:  .  Ergocalciferol (VITAMIN D2) 2000 units TABS, Take one tablet daily., Disp: , Rfl:  .  fluticasone (FLONASE) 50 MCG/ACT nasal spray, Place 2 sprays into both nostrils at bedtime., Disp: 16 g, Rfl: 2 .  levothyroxine (SYNTHROID, LEVOTHROID) 88 MCG tablet, Take 1 tablet (88 mcg total) by mouth daily before breakfast. Pt needs an appointment for further refills, Disp: 30 tablet, Rfl: 2 .  levothyroxine (SYNTHROID, LEVOTHROID) 88 MCG tablet, Take 1 tablet (88 mcg total) by mouth daily before breakfast., Disp: 30 tablet, Rfl: 5 .  lidocaine (LIDODERM) 5 %, Frequency:PRN   Dosage:0.0     Instructions:  Note:Dose: 1, Disp: , Rfl:  .  Melatonin 10 MG TABS, Take 10 mg by mouth 1 day or 1 dose. (Patient taking differently: Take 10 mg by mouth at bedtime. ), Disp: 30 tablet, Rfl: 5 .  memantine (NAMENDA) 10 MG tablet, TAKE 1 TABLET (10 MG TOTAL) BY MOUTH TWO  TIMES DAILY., Disp: 180 tablet, Rfl: 5 .  mesalamine (LIALDA) 1.2 g EC tablet, Take 3 tablets (3.6 g total) by mouth daily with lunch. (Patient taking differently: Take 3.6 g daily with lunch by mouth. Refill should come from GI), Disp: 90 tablet, Rfl: 2 .  Omega-3 Fatty Acids (FISH OIL PO), Take by mouth., Disp: , Rfl:  .  Propylene Glycol (SYSTANE BALANCE) 0.6 % SOLN, Place 1 drop into both eyes daily., Disp: , Rfl:  .  risperiDONE (RISPERDAL) 1 MG tablet, Take 0.5 tablets (0.5 mg total) at bedtime by mouth., Disp: 45 tablet, Rfl: 1 .   rivaroxaban (XARELTO) 20 MG TABS tablet, TAKE 1 TABLET DAILY WITH SUPPER, Disp: 90 tablet, Rfl: 1 .  Simethicone 180 MG CAPS, Take 180 mg by mouth., Disp: , Rfl:  .  vitamin C (ASCORBIC ACID) 500 MG tablet, Frequency:QD   Dosage:0.0     Instructions:  Note:Dose: 1, Disp: , Rfl:  .  doxycycline (VIBRA-TABS) 100 MG tablet, Take 1 tablet (100 mg total) by mouth 2 (two) times daily., Disp: 10 tablet, Rfl: 0  EXAM:  Vitals:   12/27/16 1254  BP: (!) 124/50  Pulse: 94  Temp: 98.2 F (36.8 C)    Body mass index is 30.64 kg/m.  GENERAL: vitals reviewed and listed above, alert, oriented, appears well hydrated and in no acute distress  HEENT: atraumatic, conjunttiva clear, no obvious abnormalities on inspection of external nose and ears  NECK: no obvious masses on inspection  SKIN: Quarter sized area of irritated, mildly scaly, erythematous skin with mild edema in the center of her upper chest, seems to have an SK in the center of this lesion, I do not appreciate any fluctuance on exam today, numerous SKs on the chest  MS: moves all extremities without noticeable abnormality  PSYCH: pleasant and cooperative, no obvious depression or anxiety  ASSESSMENT AND PLAN:  Discussed the following assessment and plan:  Skin lesion  Cellulitis of trunk, unspecified site of trunk  -Discussed potential etiologies, her medication allergies, options for treatment and risks -Suspect infected or inflamed SK, perhaps due to picking, with small amount cellulitis -I do not appreciate an abscess on exam today, but she reports she had some drainage from the lesion, perhaps spontaneous rupture of an abscess -We will put her on a short course of doxy -Follow-up next week to recheck -Patient advised to return or notify a doctor immediately if symptoms worsen or persist or new concerns arise.  Patient Instructions  Follow-up with your doctor next week for recheck.  Take the antibiotic as  instructed.  Warm compresses several times a day.  I hope you are feeling better soon! Seek care sooner if your symptoms worsen or new concerns arise.     Colin Benton R., DO

## 2017-01-20 ENCOUNTER — Inpatient Hospital Stay: Payer: Medicare Other

## 2017-01-20 ENCOUNTER — Inpatient Hospital Stay: Payer: Medicare Other | Attending: Hematology | Admitting: Hematology

## 2017-01-20 ENCOUNTER — Telehealth: Payer: Self-pay | Admitting: Hematology

## 2017-01-20 ENCOUNTER — Encounter: Payer: Self-pay | Admitting: Hematology

## 2017-01-20 VITALS — BP 120/102 | HR 98 | Temp 99.0°F | Resp 18 | Ht 64.0 in | Wt 179.4 lb

## 2017-01-20 DIAGNOSIS — I10 Essential (primary) hypertension: Secondary | ICD-10-CM | POA: Diagnosis not present

## 2017-01-20 DIAGNOSIS — Z170 Estrogen receptor positive status [ER+]: Secondary | ICD-10-CM | POA: Insufficient documentation

## 2017-01-20 DIAGNOSIS — F0390 Unspecified dementia without behavioral disturbance: Secondary | ICD-10-CM | POA: Diagnosis not present

## 2017-01-20 DIAGNOSIS — Z87891 Personal history of nicotine dependence: Secondary | ICD-10-CM | POA: Insufficient documentation

## 2017-01-20 DIAGNOSIS — D649 Anemia, unspecified: Secondary | ICD-10-CM | POA: Insufficient documentation

## 2017-01-20 DIAGNOSIS — F319 Bipolar disorder, unspecified: Secondary | ICD-10-CM | POA: Diagnosis not present

## 2017-01-20 DIAGNOSIS — C50512 Malignant neoplasm of lower-outer quadrant of left female breast: Secondary | ICD-10-CM | POA: Diagnosis not present

## 2017-01-20 DIAGNOSIS — Z8673 Personal history of transient ischemic attack (TIA), and cerebral infarction without residual deficits: Secondary | ICD-10-CM | POA: Diagnosis not present

## 2017-01-20 DIAGNOSIS — L98499 Non-pressure chronic ulcer of skin of other sites with unspecified severity: Secondary | ICD-10-CM | POA: Insufficient documentation

## 2017-01-20 DIAGNOSIS — I4891 Unspecified atrial fibrillation: Secondary | ICD-10-CM | POA: Diagnosis not present

## 2017-01-20 LAB — COMPREHENSIVE METABOLIC PANEL
ALBUMIN: 3.6 g/dL (ref 3.5–5.0)
ALK PHOS: 100 U/L (ref 40–150)
ALT: 10 U/L (ref 0–55)
AST: 12 U/L (ref 5–34)
Anion gap: 9 (ref 3–11)
BUN: 17 mg/dL (ref 7–26)
CHLORIDE: 107 mmol/L (ref 98–109)
CO2: 26 mmol/L (ref 22–29)
CREATININE: 0.84 mg/dL (ref 0.60–1.10)
Calcium: 9.4 mg/dL (ref 8.4–10.4)
GFR calc Af Amer: >60 mL/min (ref >60)
GFR calc non Af Amer: >60 mL/min (ref >60)
GLUCOSE: 93 mg/dL (ref 70–140)
Potassium: 4.1 mmol/L (ref 3.3–4.7)
SODIUM: 142 mmol/L (ref 136–145)
Total Bilirubin: 0.4 mg/dL (ref 0.2–1.2)
Total Protein: 7.3 g/dL (ref 6.4–8.3)

## 2017-01-20 LAB — CBC WITH DIFFERENTIAL/PLATELET
BASOS ABS: 0.1 10*3/uL (ref 0.0–0.1)
BASOS PCT: 1 %
EOS PCT: 2 %
Eosinophils Absolute: 0.2 10*3/uL (ref 0.0–0.5)
HCT: 34.4 % — ABNORMAL LOW (ref 34.8–46.6)
HEMOGLOBIN: 10.6 g/dL — AB (ref 11.6–15.9)
LYMPHS ABS: 3.6 10*3/uL — AB (ref 0.9–3.3)
Lymphocytes Relative: 46 %
MCH: 26.8 pg (ref 25.1–34.0)
MCHC: 30.8 g/dL — ABNORMAL LOW (ref 31.5–36.0)
MCV: 86.9 fL (ref 79.5–101.0)
Monocytes Absolute: 0.7 10*3/uL (ref 0.1–0.9)
Monocytes Relative: 9 %
NEUTROS PCT: 42 %
Neutro Abs: 3.3 10*3/uL (ref 1.5–6.5)
PLATELETS: 221 10*3/uL (ref 145–400)
RBC: 3.96 MIL/uL (ref 3.70–5.45)
RDW: 16 % (ref 11.2–16.1)
WBC: 7.8 10*3/uL (ref 3.9–10.3)

## 2017-01-20 NOTE — Telephone Encounter (Signed)
Scheduled appt per 1/14 los - Gave patient AVS and calender per los. - Lab and f/u 6 months.

## 2017-01-20 NOTE — Progress Notes (Signed)
[facility]  [phone] [phone]  Clinic Follow Up Note   Patient Care Team: [name], MD as PCP - General (Family Medicine) [name], MD as Consulting Physician (Hematology) [name], MD as Consulting Physician (General Surgery) Gardenia Phlegm, NP as Nurse Practitioner (Hematology and Oncology) 01/20/2017   CHIEF COMPLAINTS:  Left breast cancer   Oncology History   Breast cancer of lower-outer quadrant of left female breast [facility])   Staging form: Breast, AJCC 7th Edition   - Clinical stage from 10/31/2015: Stage IA (T1c, N0, M0) - Signed by [name], MD on 11/19/2015      Breast cancer of lower-outer quadrant of left female breast ([place])   10/23/2015 Mammogram    Diagnostic mammo and [place] shpwed left breast 4:00 complex cystic mass, 4:30 area of shadowing measuring 1.0X1.1X1.2cm      10/31/2015 Initial Diagnosis    Breast cancer of lower-outer quadrant of left female breast ([place])      10/31/2015 Initial Biopsy    Left breast core needle biopsy at 4:30 showed invasive ductal carcinoma and DCIS, , G1-2, 4:00 position biopsy was negative for malignant cells       10/31/2015 Receptors her2    ER 100%+, PR 90%+, HER2-, Ki67 15%      12/08/2015 Pathology Results    Left lumpectomy 12/08/15 Marlou Starks) 1. Breast, lumpectomy, Left - INVASIVE AND IN SITU DUCTAL CARCINOMA, 1.3 CM. - MARGINS NOT INVOLVED. - INVASIVE CARCINOMA FOCALLY 0.1 CM FROM LATERAL MARGIN. - PREVIOUS BIOPSY SITE. - FIBROCYSTIC CHANGES.       01/30/2016 -  Anti-estrogen oral therapy    Adjuvant Anastrozole 1 mg daily       HISTORY OF PRESENTING ILLNESS:  LIBA HULSEY 84 y.o. female with multiple medical comorbidities, including Dementia and bipolar, is here because of her recently diagnosed left breast cancer. She is accompanied by her husband, and her daughter to the clinic today. She was referred by her breast surgeon Dr. Marlou Starks.  This was  discovered by screening mammogram, which showed a complex cystic mass at 4:00, and an area of shadowing measuring 1.2 cm at this for 30 o'clock position of left breast, both lesions were biopsied and the 4:30 o'clock lesion showed DCIS and invasive ductal carcinoma, ER/PR stripe positive, HER-2 negative, Ki-67 15%.  She had prior left breast biopsy for benign lesion, no family history of breast cancer or other malignancy.  She lives with her husband in an independent living facility, she is able to take care of herself, both patient and her husband have dementia, they have a caregiver, who stays with them 8 hours a day.  CURRENT THERAPY: adjuvant Anastrozole 1 mg daily. Started on 01/30/2016  INTERVAL HISTORY: Amoria Mclees returns for follow up. She is doing well and is accompanied by her care giver and husband. She reports that she is doing okay overall. Her care giver notes that she has a skin ulcer on her upper right chest that was purulent. There is some dried blood there today. Pt notes that it does not itch or bother her.   She denies anything new since our last visit. She is taking Anastrozole and denies hot flashes, joint problems or any pain in back or hip. She denies any other concerns or new medical problems.  On review of systems, pt denies melena, diarrhea, abnormal bleeding, abdominal pain, nausea, and vomiting. Pertinent positives are listed and detailed within the above HPI.   Mrs. Emogene Morgan  and her husband live in a retirement community where they receive 24 hour care. Their meals are also provided for them.    MEDICAL HISTORY:  Past Medical History:  Diagnosis Date  . Bipolar affective ([place])    History of psychosis  . C. difficile diarrhea   . CAD (coronary artery disease)    LAD 30% 2006  . Cerebrovascular disease   . Colonic polyp   . Crohn's disease ([place])   . Diverticulosis of colon   . Fibromyalgia   . GERD (gastroesophageal reflux disease)   . History of atrial flutter     . History of bronchitis   . History of headache   . History of hyperparathyroidism   . History of tuberculosis   . Hyperlipemia   . Hypertension   . Hypothyroidism   . IBS (irritable bowel syndrome)   . Memory disturbance   . 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 Obesity   . OSA (obstructive sleep apnea)    denies 12/07/15  . Osteopenia   . Other diseases of lung, not elsewhere classified   . Psychosis ([place])   . Radiation 05/2008   5000 cGy to left lower eyelid, parotid lymphatics and upper neck  . Restless legs syndrome (RLS)   . Syncope   . Tuberculosis     SURGICAL HISTORY: Past Surgical History:  Procedure Laterality Date  . APPENDECTOMY    . BLADDER SUSPENSION    . BREAST LUMPECTOMY WITH RADIOACTIVE SEED LOCALIZATION Left 12/08/2015   Procedure: BREAST LUMPECTOMY WITH RADIOACTIVE SEED LOCALIZATION;  Surgeon: Autumn Messing III, MD;  Location: [facility];  Service: General;  Laterality: Left;  . CATARACT EXTRACTION Bilateral   . CHOLECYSTECTOMY    . eye lid surgery  04/04/2011   [facility] by Dr. Norlene Duel  . HEEL SPUR EXCISION    . INCISION AND DRAINAGE PERIRECTAL ABSCESS    . LUMBAR LAMINECTOMY    . Moh';s surg left lower eye lid surgery and lid reconstruction surgery  2/10  . NASAL SINUS SURGERY Right    Right maxillary  . Parathyroid adenoma surgery    . POSTERIOR CERVICAL FUSION/FORAMINOTOMY N/A 10/12/2013   Procedure: Cervical five-Thoracic two cervico-thoracic posterior fusion;  Surgeon: [name], MD;  Location: [facility];  Service: Neurosurgery;  Laterality: N/A;  . TONSILLECTOMY AND ADENOIDECTOMY      SOCIAL HISTORY: Social History   Socioeconomic History  . Marital status: Married    Spouse name: Barnabas Lister  . Number of children: 2  . Years of education: Not on file  . Highest education level: Not on file  Social Needs  . Financial resource strain: Not on file  . Food insecurity - worry: Not on file  . Food insecurity - inability: Not on file  .  Transportation needs - medical: Not on file  . Transportation needs - non-medical: Not on file  Occupational History    Employer: RETIRED  Tobacco Use  . Smoking status: Former Smoker    Years: 6.00    Types: Cigarettes    Last attempt to quit: 01/08/1960    Years since quitting: 57.0  . Smokeless tobacco: Never Used  Substance and Sexual Activity  . Alcohol use: No  . Drug use: No  . Sexual activity: Not on file  Other Topics Concern  . Not on file  Social History Narrative   Patient is married with 2 children   Patient is right handed   Patient is a  retired Marine scientist   Patient does not drink any caffeine.    FAMILY HISTORY: Family History  Problem Relation Age of Onset  . Heart failure Father   . Arthritis Mother   . Hypertension Mother   . Ulcers Mother   . Colon cancer Maternal Grandmother   . Pulmonary embolism Unknown     ALLERGIES:  is allergic to prednisone; carbamazepine; codeine; demerol [meperidine]; diclofenac sodium; fentanyl; fluoxetine hcl; gentamicin; hydromorphone hcl; ibuprofen; indomethacin; lisinopril; meperidine hcl; morphine; pregabalin; propranolol hcl; refresh lacri-lube [eye lubricant]; ropinirole hcl; sulfamethoxazole; white petrolatum-mineral oil  [aquaphilic]; and bacitracin.  MEDICATIONS:  Current Outpatient Medications  Medication Sig Dispense Refill  . amLODipine (NORVASC) 5 MG tablet TAKE 1 TABLET (5 MG TOTAL) BY MOUTH DAILY. 90 tablet 2  . anastrozole (ARIMIDEX) 1 MG tablet Take 1 tablet (1 mg total) by mouth daily. 90 tablet 3  . aspirin 81 MG EC tablet Take 81 mg by mouth.    Mar[place] Kitchen atorvastatin (LIPITOR) 40 MG tablet TAKE 1 TABLET DAILY 90 tablet 1  . Biotin 10 MG TABS Take 10 mg by mouth.    . Calcium 500 MG tablet Take 2 tabs by mouth daily at 6 pm 60 tablet 11  . Cholecalciferol (VITAMIN D3) 2000 units TABS Take 2,000 Units by mouth daily at 12 noon. 30 tablet 11  . dicyclomine (BENTYL) 10 MG capsule Take 1 capsule (10 mg total) by mouth 3  (three) times daily before meals. 20 capsule 0  . donepezil (ARICEPT) 10 MG tablet     . doxycycline (VIBRA-TABS) 100 MG tablet Take 1 tablet (100 mg total) by mouth 2 (two) times daily. 10 tablet 0  . Ergocalciferol (VITAMIN D2) 2000 units TABS Take one tablet daily.    . fluticasone (FLONASE) 50 MCG/ACT nasal spray Place 2 sprays into both nostrils at bedtime. 16 g 2  . levothyroxine (SYNTHROID, LEVOTHROID) 88 MCG tablet Take 1 tablet (88 mcg total) by mouth daily before breakfast. Pt needs an appointment for further refills 30 tablet 2  . lidocaine (LIDODERM) 5 % Frequency:PRN   Dosage:0.0     Instructions:  Note:Dose: 1    . Melatonin 10 MG TABS Take 10 mg by mouth 1 day or 1 dose. (Patient taking differently: Take 10 mg by mouth at bedtime. ) 30 tablet 5  . memantine (NAMENDA) 10 MG tablet TAKE 1 TABLET (10 MG TOTAL) BY MOUTH TWO  TIMES DAILY. 180 tablet 5  . mesalamine (LIALDA) 1.2 g EC tablet Take 3 tablets (3.6 g total) by mouth daily with lunch. (Patient taking differently: Take 3.6 g daily with lunch by mouth. Refill should come from GI) 90 tablet 2  . Omega-3 Fatty Acids (FISH OIL PO) Take by mouth.    . Propylene Glycol (SYSTANE BALANCE) 0.6 % SOLN Place 1 drop into both eyes daily.    . risperiDONE (RISPERDAL) 1 MG tablet Take 0.5 tablets (0.5 mg total) at bedtime by mouth. 45 tablet 1  . rivaroxaban (XARELTO) 20 MG TABS tablet TAKE 1 TABLET DAILY WITH SUPPER 90 tablet 1  . Simethicone 180 MG CAPS Take 180 mg by mouth.    . vitamin C (ASCORBIC ACID) 500 MG tablet Frequency:QD   Dosage:0.0     Instructions:  Note:Dose: 1     No current facility-administered medications for this visit.     REVIEW OF SYSTEMS:   Constitutional: Denies fevers, chills or abnormal night sweats  Eyes: Denies blurriness of vision, double vision or watery eyes Ears,  nose, mouth, throat, and face: Denies mucositis or sore throat Respiratory: Denies cough, dyspnea or wheezes Cardiovascular: Denies  palpitation, chest discomfort or lower extremity swelling Gastrointestinal:  Denies nausea, heartburn or change in bowel habits Skin: Denies abnormal skin rashes Lymphatics: Denies new lymphadenopathy or easy bruising Neurological:Denies numbness, tingling or new weaknesses Behavioral/Psych: Mood is stable, no new changes  All other systems were reviewed with the patient and are negative.  PHYSICAL EXAMINATION:  ECOG PERFORMANCE STATUS: 2 - Symptomatic, <50% confined to bed  Vitals:   01/20/17 1351  BP: (!) 120/102  Pulse: 98  Resp: 18  Temp: 99 F (37.2 C)  SpO2: 99%   Filed Weights   01/20/17 1351  Weight: 179 lb 6.4 oz (81.4 kg)   GENERAL:alert, no distress and comfortable SKIN: skin color, texture, turgor are normal, Skin ulcer in the upper right chest with dried blood EYES: normal, conjunctiva are pink and non-injected, sclera clear (+) b/l discharge  OROPHARYNX:no exudate, no erythema and lips, buccal mucosa, and tongue normal  NECK: supple, thyroid normal size, non-tender, without nodularity LYMPH:  no palpable lymphadenopathy in the cervical, axillary or inguinal  LUNGS: clear to auscultation and percussion with normal breathing effort HEART: regular rate & rhythm and no murmurs and no lower extremity edema ABDOMEN:abdomen soft, non-tender and normal bowel sounds Musculoskeletal:no cyanosis of digits and no clubbing  PSYCH: alert & oriented x 3 with fluent speech NEURO: no focal motor/sensory deficits BREAST: She has a surgical incision in the LOQ of the left breast that has healed well. No discharge or skin erythema. No palpable masses in either breast. No nipple discharge or bleeding noted in the bilateral breasts. (+) Skin erythema below left breast from fungal infection  LABORATORY DATA:  I have reviewed the data as listed CBC Latest Ref Rng & Units 01/20/2017 07/22/2016 03/18/2016  WBC 3.9 - 10.3 K/uL 7.8 9.0 11.3(H)  Hemoglobin 11.6 - 15.9 g/dL 10.6(L) 12.0 12.1    Hematocrit 34.8 - 46.6 % 34.4(L) 37.7 37.9  Platelets 145 - 400 K/uL 221 238 234   CMP Latest Ref Rng & Units 01/20/2017 07/22/2016 03/18/2016  Glucose 70 - 140 mg/dL 93 100 93  BUN 7 - 26 mg/dL 17 16.1 19.6  Creatinine 0.60 - 1.10 mg/dL 0.84 0.9 0.9  Sodium 136 - 145 mmol/L 142 139 140  Potassium 3.3 - 4.7 mmol/L 4.1 4.3 4.3  Chloride 98 - 109 mmol/L 107 - -  CO2 22 - 29 mmol/L _0 Calcium 8.4 - 10.4 mg/dL 9.4 9.9 10.0  Total Protein 6.4 - 8.3 g/dL 7.3 7.4 7.1  Total Bilirubin 0.2 - 1.2 mg/dL 0.4 0.64 0.88  Alkaline Phos 40 - 150 U/L 100 90 104  AST 5 - 34 U/L _1 ALT 0 - 55 U/L _2 Pathology report  Diagnosis 12/08/2015 1. Breast, lumpectomy, Left - INVASIVE AND IN SITU DUCTAL CARCINOMA, 1.3 CM. - MARGINS NOT INVOLVED. - INVASIVE CARCINOMA FOCALLY 0.1 CM FROM LATERAL MARGIN. - PREVIOUS BIOPSY SITE. - FIBROCYSTIC CHANGES.  2. Breast, excision, Left additional Deep Margin - BENIGN BREAST TISSUE. - FINAL DEEP MARGIN CLEAR. 3. Breast, excision, Left additional Medial Margin - BENIGN BREAST TISSUE. - FINAL MEDIAL MARGIN CLEAR. 4. Breast, excision, Left additional Inferior Margin - BENIGN BREAST TISSUE. - FINAL INFERIOR MARGIN CLEAR. Microscopic Comment 1. BREAST, INVASIVE TUMOR, WITHOUT LYMPH NODES PRESENT Specimen, including laterality: Left breast Procedure: Lumpectomy with additional deep, medial, and inferior margins. Histologic type:  Ductal. Grade: 1 Tubule formation: 1 Nuclear pleomorphism: 2 Mitotic: 1 Tumor size (gross measurement): 1.3 cm Margins: Invasive, distance to closest margin: 0.1 cm from lateral margin. In-situ, distance to closest margin: 0.3 cm from lateral margin. If margin positive, focally or broadly: N/A Lymphovascular invasion: No 1 of 3 FINAL for Massett, Brityn P ([id number]) Microscopic Comment(continued) Ductal carcinoma in situ: Present. Grade: Low grade. Extensive intraductal component: No. Lobular neoplasia: No. Tumor  focality: Unifocal Extent of tumor: Skin: N/A Nipple: N/A Skeletal muscle: N/A Breast prognostic profile: Case [id number] Estrogen receptor: 100%, positive, strong staining Progesterone receptor: 90%, positive, strong staining. Her 2 neu: Negative, ratio 1.37. Ki-67: 37% Non-neoplastic breast: Fibrocystic changes. TNM: pT1c, pNX, pMX Comments: The invasive carcinoma is focally less than 0.1 cm from the inferior margin in the lumpectomy specimen (part 1); however, the additional inferior margin specimen (part 4) is benign and therefore the final inferior margin is greater than 0.9 cm. (JDP:gt, 12/11/15) Claudette Laws MD Pathologist, Electronic Signature (Case signed 12/11/2015)  Diagnosis 10/31/2015 1. Breast, left, needle core biopsy, 4:30, mass - INVASIVE DUCTAL CARCINOMA. - DUCTAL CARCINOMA IN SITU. - SEE COMMENT. 2. Breast, left, needle core biopsy, 4 o'clock, cystic mass - FIBROCYSTIC CHANGES. - THERE IS NO EVIDENCE OF MALIGNANCY. Microscopic Comment 1. The carcinoma appears grade 1-2. A breast prognostic profile will be performed and the results reported separately. The results were called to [facility] on 11/01/15. (JBK:gt, 11/01/15) Results: IMMUNOHISTOCHEMICAL AND MORPHOMETRIC ANALYSIS PERFORMED MANUALLY Estrogen Receptor: 100%, POSITIVE, STRONG STAINING INTENSITY Progesterone Receptor: 90%, POSITIVE, STRONG STAINING INTENSITY Proliferation Marker Ki67: 15%  Results: HER2 - NEGATIVE RATIO OF HER2/CEP17 SIGNALS 1.37 AVERAGE HER2 COPY NUMBER PER CELL 2.80  RADIOGRAPHIC STUDIES: I have personally reviewed the radiological images as listed and agreed with the findings in the report.  Diagnostic Mammogram 10/23/2015 IMPRESSION: Left breast 4 o'clock complex cystic mass.  Left breast 4:30 o'clock area of shadowing.  ASSESSMENT & PLAN:  84 y.o. Caucasian female, with multiple comorbidities, including hypertension, history of TIA, atrial  fibrillation on Xarelto, dementia, bipolar, presented with screening discovered left breast cancer  1. Breast cancer of lower-outer quadrant of left breast, invasive and in situ ductal carcinoma, pT1cN0M0, stage IA, ER+/PR+/HER2- ---The patient had a left lumpectomy on 12/08/15 revealed invasive and in situ ductal carcinoma measuring 1.3 cm with negative margins, the closest margin was 0.1 cm on lateral. I discussed her pathology findings with patient and her daughter. --Given her advanced age and comorbidities, it may be difficult for her to have adjuvant breast radiation, and I think the benefit is limited. The patient saw Dr. Isidore Moos on 11/24/15 who did not recommend radiation given the patient's age and comorbidities -We reviewed the risk of breast cancer recurrence, given her early stage and G1, this is likely low risk disease. --She is on adjuvant anastrozole, tolerating well, plan for total 5 years if she tolerates  -Given her advanced age and dementia, if she does experience side effects from anastrozole, I'll stop it. -We again previously discussed the breast cancer surveillance after her surgery. She will continue annual screening mammogram, and a routine office visit with lab and exam with [place]. -She is clinically doing well, lab results reviewed, exam was unremarkable, no clinical concern for recurrence. - she is overdue for mammogram, I will order for her today to be done within 1 month -Continue taking Anastrozole  - Her hgb is 10.6 today (01/20/17) I recommend that she take a multivitamin daily to help  increase iron. I advised them to look for bleeding   2. HTN, history of TIA, dementia, bipolar, AF -She'll continue follow-up with her primary care physician -She is on Xarelto for atrial fibrillation.   3. Skin ulcer in the upper right chest -Upon exam today, she has a small skin ulcer at the upper right chest, likely a ruptured boil, dried blood was cleaned. No clinical concern for  cancer -I suggested topical Neosporin to help healing  -I advised her care giver to watch for changes or lump appearence after healing  4. Anemia  -She has developed mild anemia, no clinical sign of bleeding. -On Xarelto, I recommend clinical monitoring for signs of bleeding (none so far).  PLAN -Continue Anastrozole 1 mg. -Lab and f/u in 6 months. - order mammogram to be done in Feb/2019 -start a multivitamin    All questions were answered. The patient knows to call the clinic with any problems, questions or concerns.  I spent 20 minutes counseling the patient face to face. The total time spent in the appointment was 25 minutes and more than 50% was on counseling.  This document serves as a record of services personally performed by [name], MD. It was created on her behalf by Theresia Bough, a trained medical scribe. The creation of this record is based on the scribe's personal observations and the provider's statements to them.   I have reviewed the above documentation for accuracy and completeness, and I agree with the above.    [name], MD 01/20/2017

## 2017-01-22 ENCOUNTER — Telehealth: Payer: Self-pay | Admitting: Family Medicine

## 2017-01-22 NOTE — Telephone Encounter (Signed)
Left detailed message on machine with directions.

## 2017-01-22 NOTE — Telephone Encounter (Signed)
I would just get her back on regular schedule tonight.  Would not try to "make up" any missed doses.

## 2017-01-22 NOTE — Telephone Encounter (Signed)
Copied from [facility] [phone]. Topic: General - Other >> Jan 22, 2017  9:32 AM Darl Householder, RMA wrote: Reason for CRM: Armando Gang from [facility] is calling to notify Dr. [name] that pt missed pm meds last night and was given Am meds instead and she missed her dosage of Zarelto 20 mg, please return call to [phone]

## 2017-01-31 ENCOUNTER — Other Ambulatory Visit: Payer: Self-pay | Admitting: Internal Medicine

## 2017-01-31 ENCOUNTER — Ambulatory Visit
Admission: RE | Admit: 2017-01-31 | Discharge: 2017-01-31 | Disposition: A | Discharge status: Home / Self Care | Payer: Medicare Other | Source: Ambulatory Visit | Attending: Internal Medicine | Admitting: Internal Medicine

## 2017-01-31 DIAGNOSIS — J9811 Atelectasis: Secondary | ICD-10-CM | POA: Diagnosis not present

## 2017-01-31 DIAGNOSIS — Z8611 Personal history of tuberculosis: Secondary | ICD-10-CM

## 2017-02-13 DIAGNOSIS — M6281 Muscle weakness (generalized): Secondary | ICD-10-CM | POA: Diagnosis not present

## 2017-02-13 DIAGNOSIS — R278 Other lack of coordination: Secondary | ICD-10-CM | POA: Diagnosis not present

## 2017-02-13 DIAGNOSIS — R3981 Functional urinary incontinence: Secondary | ICD-10-CM | POA: Diagnosis not present

## 2017-02-13 DIAGNOSIS — R262 Difficulty in walking, not elsewhere classified: Secondary | ICD-10-CM | POA: Diagnosis not present

## 2017-02-13 DIAGNOSIS — R2681 Unsteadiness on feet: Secondary | ICD-10-CM | POA: Diagnosis not present

## 2017-02-14 DIAGNOSIS — R2681 Unsteadiness on feet: Secondary | ICD-10-CM | POA: Diagnosis not present

## 2017-02-14 DIAGNOSIS — R3981 Functional urinary incontinence: Secondary | ICD-10-CM | POA: Diagnosis not present

## 2017-02-14 DIAGNOSIS — M6281 Muscle weakness (generalized): Secondary | ICD-10-CM | POA: Diagnosis not present

## 2017-02-14 DIAGNOSIS — R278 Other lack of coordination: Secondary | ICD-10-CM | POA: Diagnosis not present

## 2017-02-14 DIAGNOSIS — R262 Difficulty in walking, not elsewhere classified: Secondary | ICD-10-CM | POA: Diagnosis not present

## 2017-02-18 DIAGNOSIS — R3981 Functional urinary incontinence: Secondary | ICD-10-CM | POA: Diagnosis not present

## 2017-02-18 DIAGNOSIS — R278 Other lack of coordination: Secondary | ICD-10-CM | POA: Diagnosis not present

## 2017-02-18 DIAGNOSIS — M6281 Muscle weakness (generalized): Secondary | ICD-10-CM | POA: Diagnosis not present

## 2017-02-18 DIAGNOSIS — R262 Difficulty in walking, not elsewhere classified: Secondary | ICD-10-CM | POA: Diagnosis not present

## 2017-02-18 DIAGNOSIS — R2681 Unsteadiness on feet: Secondary | ICD-10-CM | POA: Diagnosis not present

## 2017-02-19 DIAGNOSIS — R262 Difficulty in walking, not elsewhere classified: Secondary | ICD-10-CM | POA: Diagnosis not present

## 2017-02-19 DIAGNOSIS — R278 Other lack of coordination: Secondary | ICD-10-CM | POA: Diagnosis not present

## 2017-02-19 DIAGNOSIS — R3981 Functional urinary incontinence: Secondary | ICD-10-CM | POA: Diagnosis not present

## 2017-02-19 DIAGNOSIS — R2681 Unsteadiness on feet: Secondary | ICD-10-CM | POA: Diagnosis not present

## 2017-02-19 DIAGNOSIS — M6281 Muscle weakness (generalized): Secondary | ICD-10-CM | POA: Diagnosis not present

## 2017-02-20 DIAGNOSIS — M6281 Muscle weakness (generalized): Secondary | ICD-10-CM | POA: Diagnosis not present

## 2017-02-20 DIAGNOSIS — R278 Other lack of coordination: Secondary | ICD-10-CM | POA: Diagnosis not present

## 2017-02-20 DIAGNOSIS — R2681 Unsteadiness on feet: Secondary | ICD-10-CM | POA: Diagnosis not present

## 2017-02-20 DIAGNOSIS — R3981 Functional urinary incontinence: Secondary | ICD-10-CM | POA: Diagnosis not present

## 2017-02-20 DIAGNOSIS — R262 Difficulty in walking, not elsewhere classified: Secondary | ICD-10-CM | POA: Diagnosis not present

## 2017-02-21 DIAGNOSIS — R3981 Functional urinary incontinence: Secondary | ICD-10-CM | POA: Diagnosis not present

## 2017-02-21 DIAGNOSIS — R2681 Unsteadiness on feet: Secondary | ICD-10-CM | POA: Diagnosis not present

## 2017-02-21 DIAGNOSIS — M6281 Muscle weakness (generalized): Secondary | ICD-10-CM | POA: Diagnosis not present

## 2017-02-21 DIAGNOSIS — R262 Difficulty in walking, not elsewhere classified: Secondary | ICD-10-CM | POA: Diagnosis not present

## 2017-02-21 DIAGNOSIS — R278 Other lack of coordination: Secondary | ICD-10-CM | POA: Diagnosis not present

## 2017-02-24 DIAGNOSIS — R278 Other lack of coordination: Secondary | ICD-10-CM | POA: Diagnosis not present

## 2017-02-24 DIAGNOSIS — R262 Difficulty in walking, not elsewhere classified: Secondary | ICD-10-CM | POA: Diagnosis not present

## 2017-02-24 DIAGNOSIS — R2681 Unsteadiness on feet: Secondary | ICD-10-CM | POA: Diagnosis not present

## 2017-02-24 DIAGNOSIS — M6281 Muscle weakness (generalized): Secondary | ICD-10-CM | POA: Diagnosis not present

## 2017-02-24 DIAGNOSIS — R3981 Functional urinary incontinence: Secondary | ICD-10-CM | POA: Diagnosis not present

## 2017-02-25 DIAGNOSIS — M6281 Muscle weakness (generalized): Secondary | ICD-10-CM | POA: Diagnosis not present

## 2017-02-25 DIAGNOSIS — R278 Other lack of coordination: Secondary | ICD-10-CM | POA: Diagnosis not present

## 2017-02-25 DIAGNOSIS — R2681 Unsteadiness on feet: Secondary | ICD-10-CM | POA: Diagnosis not present

## 2017-02-25 DIAGNOSIS — R262 Difficulty in walking, not elsewhere classified: Secondary | ICD-10-CM | POA: Diagnosis not present

## 2017-02-25 DIAGNOSIS — R3981 Functional urinary incontinence: Secondary | ICD-10-CM | POA: Diagnosis not present

## 2017-02-26 DIAGNOSIS — M6281 Muscle weakness (generalized): Secondary | ICD-10-CM | POA: Diagnosis not present

## 2017-02-26 DIAGNOSIS — R278 Other lack of coordination: Secondary | ICD-10-CM | POA: Diagnosis not present

## 2017-02-26 DIAGNOSIS — R3981 Functional urinary incontinence: Secondary | ICD-10-CM | POA: Diagnosis not present

## 2017-02-26 DIAGNOSIS — R2681 Unsteadiness on feet: Secondary | ICD-10-CM | POA: Diagnosis not present

## 2017-02-26 DIAGNOSIS — R262 Difficulty in walking, not elsewhere classified: Secondary | ICD-10-CM | POA: Diagnosis not present

## 2017-02-27 DIAGNOSIS — M6281 Muscle weakness (generalized): Secondary | ICD-10-CM | POA: Diagnosis not present

## 2017-02-27 DIAGNOSIS — R3981 Functional urinary incontinence: Secondary | ICD-10-CM | POA: Diagnosis not present

## 2017-02-27 DIAGNOSIS — R278 Other lack of coordination: Secondary | ICD-10-CM | POA: Diagnosis not present

## 2017-02-27 DIAGNOSIS — R262 Difficulty in walking, not elsewhere classified: Secondary | ICD-10-CM | POA: Diagnosis not present

## 2017-02-27 DIAGNOSIS — R2681 Unsteadiness on feet: Secondary | ICD-10-CM | POA: Diagnosis not present

## 2017-02-28 DIAGNOSIS — R3981 Functional urinary incontinence: Secondary | ICD-10-CM | POA: Diagnosis not present

## 2017-02-28 DIAGNOSIS — R278 Other lack of coordination: Secondary | ICD-10-CM | POA: Diagnosis not present

## 2017-02-28 DIAGNOSIS — R262 Difficulty in walking, not elsewhere classified: Secondary | ICD-10-CM | POA: Diagnosis not present

## 2017-02-28 DIAGNOSIS — M6281 Muscle weakness (generalized): Secondary | ICD-10-CM | POA: Diagnosis not present

## 2017-02-28 DIAGNOSIS — R2681 Unsteadiness on feet: Secondary | ICD-10-CM | POA: Diagnosis not present

## 2017-03-03 DIAGNOSIS — R3981 Functional urinary incontinence: Secondary | ICD-10-CM | POA: Diagnosis not present

## 2017-03-03 DIAGNOSIS — R278 Other lack of coordination: Secondary | ICD-10-CM | POA: Diagnosis not present

## 2017-03-03 DIAGNOSIS — R262 Difficulty in walking, not elsewhere classified: Secondary | ICD-10-CM | POA: Diagnosis not present

## 2017-03-03 DIAGNOSIS — R2681 Unsteadiness on feet: Secondary | ICD-10-CM | POA: Diagnosis not present

## 2017-03-03 DIAGNOSIS — M6281 Muscle weakness (generalized): Secondary | ICD-10-CM | POA: Diagnosis not present

## 2017-03-04 DIAGNOSIS — R2681 Unsteadiness on feet: Secondary | ICD-10-CM | POA: Diagnosis not present

## 2017-03-04 DIAGNOSIS — R262 Difficulty in walking, not elsewhere classified: Secondary | ICD-10-CM | POA: Diagnosis not present

## 2017-03-04 DIAGNOSIS — M6281 Muscle weakness (generalized): Secondary | ICD-10-CM | POA: Diagnosis not present

## 2017-03-04 DIAGNOSIS — R3981 Functional urinary incontinence: Secondary | ICD-10-CM | POA: Diagnosis not present

## 2017-03-04 DIAGNOSIS — R278 Other lack of coordination: Secondary | ICD-10-CM | POA: Diagnosis not present

## 2017-03-06 DIAGNOSIS — R2681 Unsteadiness on feet: Secondary | ICD-10-CM | POA: Diagnosis not present

## 2017-03-06 DIAGNOSIS — R278 Other lack of coordination: Secondary | ICD-10-CM | POA: Diagnosis not present

## 2017-03-06 DIAGNOSIS — R3981 Functional urinary incontinence: Secondary | ICD-10-CM | POA: Diagnosis not present

## 2017-03-06 DIAGNOSIS — R262 Difficulty in walking, not elsewhere classified: Secondary | ICD-10-CM | POA: Diagnosis not present

## 2017-03-06 DIAGNOSIS — M6281 Muscle weakness (generalized): Secondary | ICD-10-CM | POA: Diagnosis not present

## 2017-03-07 DIAGNOSIS — M6281 Muscle weakness (generalized): Secondary | ICD-10-CM | POA: Diagnosis not present

## 2017-03-07 DIAGNOSIS — R262 Difficulty in walking, not elsewhere classified: Secondary | ICD-10-CM | POA: Diagnosis not present

## 2017-03-07 DIAGNOSIS — R2681 Unsteadiness on feet: Secondary | ICD-10-CM | POA: Diagnosis not present

## 2017-03-07 DIAGNOSIS — R278 Other lack of coordination: Secondary | ICD-10-CM | POA: Diagnosis not present

## 2017-03-07 DIAGNOSIS — R3981 Functional urinary incontinence: Secondary | ICD-10-CM | POA: Diagnosis not present

## 2017-03-10 DIAGNOSIS — R2681 Unsteadiness on feet: Secondary | ICD-10-CM | POA: Diagnosis not present

## 2017-03-10 DIAGNOSIS — M6281 Muscle weakness (generalized): Secondary | ICD-10-CM | POA: Diagnosis not present

## 2017-03-10 DIAGNOSIS — R3981 Functional urinary incontinence: Secondary | ICD-10-CM | POA: Diagnosis not present

## 2017-03-10 DIAGNOSIS — R278 Other lack of coordination: Secondary | ICD-10-CM | POA: Diagnosis not present

## 2017-03-10 DIAGNOSIS — R262 Difficulty in walking, not elsewhere classified: Secondary | ICD-10-CM | POA: Diagnosis not present

## 2017-03-12 DIAGNOSIS — R2681 Unsteadiness on feet: Secondary | ICD-10-CM | POA: Diagnosis not present

## 2017-03-12 DIAGNOSIS — R262 Difficulty in walking, not elsewhere classified: Secondary | ICD-10-CM | POA: Diagnosis not present

## 2017-03-12 DIAGNOSIS — R3981 Functional urinary incontinence: Secondary | ICD-10-CM | POA: Diagnosis not present

## 2017-03-12 DIAGNOSIS — M6281 Muscle weakness (generalized): Secondary | ICD-10-CM | POA: Diagnosis not present

## 2017-03-12 DIAGNOSIS — R278 Other lack of coordination: Secondary | ICD-10-CM | POA: Diagnosis not present

## 2017-03-13 DIAGNOSIS — R2681 Unsteadiness on feet: Secondary | ICD-10-CM | POA: Diagnosis not present

## 2017-03-13 DIAGNOSIS — R3981 Functional urinary incontinence: Secondary | ICD-10-CM | POA: Diagnosis not present

## 2017-03-13 DIAGNOSIS — M6281 Muscle weakness (generalized): Secondary | ICD-10-CM | POA: Diagnosis not present

## 2017-03-13 DIAGNOSIS — R278 Other lack of coordination: Secondary | ICD-10-CM | POA: Diagnosis not present

## 2017-03-13 DIAGNOSIS — R262 Difficulty in walking, not elsewhere classified: Secondary | ICD-10-CM | POA: Diagnosis not present

## 2017-03-14 DIAGNOSIS — R278 Other lack of coordination: Secondary | ICD-10-CM | POA: Diagnosis not present

## 2017-03-14 DIAGNOSIS — R3981 Functional urinary incontinence: Secondary | ICD-10-CM | POA: Diagnosis not present

## 2017-03-14 DIAGNOSIS — R262 Difficulty in walking, not elsewhere classified: Secondary | ICD-10-CM | POA: Diagnosis not present

## 2017-03-14 DIAGNOSIS — R2681 Unsteadiness on feet: Secondary | ICD-10-CM | POA: Diagnosis not present

## 2017-03-14 DIAGNOSIS — M6281 Muscle weakness (generalized): Secondary | ICD-10-CM | POA: Diagnosis not present

## 2017-03-17 DIAGNOSIS — M6281 Muscle weakness (generalized): Secondary | ICD-10-CM | POA: Diagnosis not present

## 2017-03-17 DIAGNOSIS — R262 Difficulty in walking, not elsewhere classified: Secondary | ICD-10-CM | POA: Diagnosis not present

## 2017-03-17 DIAGNOSIS — R3981 Functional urinary incontinence: Secondary | ICD-10-CM | POA: Diagnosis not present

## 2017-03-17 DIAGNOSIS — R2681 Unsteadiness on feet: Secondary | ICD-10-CM | POA: Diagnosis not present

## 2017-03-17 DIAGNOSIS — R278 Other lack of coordination: Secondary | ICD-10-CM | POA: Diagnosis not present

## 2017-03-18 DIAGNOSIS — R278 Other lack of coordination: Secondary | ICD-10-CM | POA: Diagnosis not present

## 2017-03-18 DIAGNOSIS — M6281 Muscle weakness (generalized): Secondary | ICD-10-CM | POA: Diagnosis not present

## 2017-03-18 DIAGNOSIS — R262 Difficulty in walking, not elsewhere classified: Secondary | ICD-10-CM | POA: Diagnosis not present

## 2017-03-18 DIAGNOSIS — R3981 Functional urinary incontinence: Secondary | ICD-10-CM | POA: Diagnosis not present

## 2017-03-18 DIAGNOSIS — R2681 Unsteadiness on feet: Secondary | ICD-10-CM | POA: Diagnosis not present

## 2017-03-19 DIAGNOSIS — R3981 Functional urinary incontinence: Secondary | ICD-10-CM | POA: Diagnosis not present

## 2017-03-19 DIAGNOSIS — M6281 Muscle weakness (generalized): Secondary | ICD-10-CM | POA: Diagnosis not present

## 2017-03-19 DIAGNOSIS — R2681 Unsteadiness on feet: Secondary | ICD-10-CM | POA: Diagnosis not present

## 2017-03-19 DIAGNOSIS — R262 Difficulty in walking, not elsewhere classified: Secondary | ICD-10-CM | POA: Diagnosis not present

## 2017-03-19 DIAGNOSIS — R278 Other lack of coordination: Secondary | ICD-10-CM | POA: Diagnosis not present

## 2017-03-20 DIAGNOSIS — R2681 Unsteadiness on feet: Secondary | ICD-10-CM | POA: Diagnosis not present

## 2017-03-20 DIAGNOSIS — R3981 Functional urinary incontinence: Secondary | ICD-10-CM | POA: Diagnosis not present

## 2017-03-20 DIAGNOSIS — M6281 Muscle weakness (generalized): Secondary | ICD-10-CM | POA: Diagnosis not present

## 2017-03-20 DIAGNOSIS — R262 Difficulty in walking, not elsewhere classified: Secondary | ICD-10-CM | POA: Diagnosis not present

## 2017-03-20 DIAGNOSIS — R278 Other lack of coordination: Secondary | ICD-10-CM | POA: Diagnosis not present

## 2017-03-21 DIAGNOSIS — R278 Other lack of coordination: Secondary | ICD-10-CM | POA: Diagnosis not present

## 2017-03-21 DIAGNOSIS — M6281 Muscle weakness (generalized): Secondary | ICD-10-CM | POA: Diagnosis not present

## 2017-03-21 DIAGNOSIS — R262 Difficulty in walking, not elsewhere classified: Secondary | ICD-10-CM | POA: Diagnosis not present

## 2017-03-21 DIAGNOSIS — R2681 Unsteadiness on feet: Secondary | ICD-10-CM | POA: Diagnosis not present

## 2017-03-21 DIAGNOSIS — R3981 Functional urinary incontinence: Secondary | ICD-10-CM | POA: Diagnosis not present

## 2017-03-25 DIAGNOSIS — M6281 Muscle weakness (generalized): Secondary | ICD-10-CM | POA: Diagnosis not present

## 2017-03-25 DIAGNOSIS — R2681 Unsteadiness on feet: Secondary | ICD-10-CM | POA: Diagnosis not present

## 2017-03-25 DIAGNOSIS — R3981 Functional urinary incontinence: Secondary | ICD-10-CM | POA: Diagnosis not present

## 2017-03-25 DIAGNOSIS — R278 Other lack of coordination: Secondary | ICD-10-CM | POA: Diagnosis not present

## 2017-03-25 DIAGNOSIS — R262 Difficulty in walking, not elsewhere classified: Secondary | ICD-10-CM | POA: Diagnosis not present

## 2017-03-26 DIAGNOSIS — M6281 Muscle weakness (generalized): Secondary | ICD-10-CM | POA: Diagnosis not present

## 2017-03-26 DIAGNOSIS — R3981 Functional urinary incontinence: Secondary | ICD-10-CM | POA: Diagnosis not present

## 2017-03-26 DIAGNOSIS — R2681 Unsteadiness on feet: Secondary | ICD-10-CM | POA: Diagnosis not present

## 2017-03-26 DIAGNOSIS — R278 Other lack of coordination: Secondary | ICD-10-CM | POA: Diagnosis not present

## 2017-03-26 DIAGNOSIS — R262 Difficulty in walking, not elsewhere classified: Secondary | ICD-10-CM | POA: Diagnosis not present

## 2017-03-27 DIAGNOSIS — R278 Other lack of coordination: Secondary | ICD-10-CM | POA: Diagnosis not present

## 2017-03-27 DIAGNOSIS — R3981 Functional urinary incontinence: Secondary | ICD-10-CM | POA: Diagnosis not present

## 2017-03-27 DIAGNOSIS — R2681 Unsteadiness on feet: Secondary | ICD-10-CM | POA: Diagnosis not present

## 2017-03-27 DIAGNOSIS — M6281 Muscle weakness (generalized): Secondary | ICD-10-CM | POA: Diagnosis not present

## 2017-03-27 DIAGNOSIS — R262 Difficulty in walking, not elsewhere classified: Secondary | ICD-10-CM | POA: Diagnosis not present

## 2017-03-28 DIAGNOSIS — R278 Other lack of coordination: Secondary | ICD-10-CM | POA: Diagnosis not present

## 2017-03-28 DIAGNOSIS — R2681 Unsteadiness on feet: Secondary | ICD-10-CM | POA: Diagnosis not present

## 2017-03-28 DIAGNOSIS — R262 Difficulty in walking, not elsewhere classified: Secondary | ICD-10-CM | POA: Diagnosis not present

## 2017-03-28 DIAGNOSIS — M6281 Muscle weakness (generalized): Secondary | ICD-10-CM | POA: Diagnosis not present

## 2017-03-28 DIAGNOSIS — R3981 Functional urinary incontinence: Secondary | ICD-10-CM | POA: Diagnosis not present

## 2017-03-31 DIAGNOSIS — R3981 Functional urinary incontinence: Secondary | ICD-10-CM | POA: Diagnosis not present

## 2017-03-31 DIAGNOSIS — R262 Difficulty in walking, not elsewhere classified: Secondary | ICD-10-CM | POA: Diagnosis not present

## 2017-03-31 DIAGNOSIS — M6281 Muscle weakness (generalized): Secondary | ICD-10-CM | POA: Diagnosis not present

## 2017-03-31 DIAGNOSIS — R2681 Unsteadiness on feet: Secondary | ICD-10-CM | POA: Diagnosis not present

## 2017-03-31 DIAGNOSIS — R278 Other lack of coordination: Secondary | ICD-10-CM | POA: Diagnosis not present

## 2017-04-01 DIAGNOSIS — R2681 Unsteadiness on feet: Secondary | ICD-10-CM | POA: Diagnosis not present

## 2017-04-01 DIAGNOSIS — R262 Difficulty in walking, not elsewhere classified: Secondary | ICD-10-CM | POA: Diagnosis not present

## 2017-04-01 DIAGNOSIS — R3981 Functional urinary incontinence: Secondary | ICD-10-CM | POA: Diagnosis not present

## 2017-04-01 DIAGNOSIS — R278 Other lack of coordination: Secondary | ICD-10-CM | POA: Diagnosis not present

## 2017-04-01 DIAGNOSIS — M6281 Muscle weakness (generalized): Secondary | ICD-10-CM | POA: Diagnosis not present

## 2017-04-02 DIAGNOSIS — M6281 Muscle weakness (generalized): Secondary | ICD-10-CM | POA: Diagnosis not present

## 2017-04-02 DIAGNOSIS — R3981 Functional urinary incontinence: Secondary | ICD-10-CM | POA: Diagnosis not present

## 2017-04-02 DIAGNOSIS — R2681 Unsteadiness on feet: Secondary | ICD-10-CM | POA: Diagnosis not present

## 2017-04-02 DIAGNOSIS — R278 Other lack of coordination: Secondary | ICD-10-CM | POA: Diagnosis not present

## 2017-04-02 DIAGNOSIS — R262 Difficulty in walking, not elsewhere classified: Secondary | ICD-10-CM | POA: Diagnosis not present

## 2017-04-03 DIAGNOSIS — M6281 Muscle weakness (generalized): Secondary | ICD-10-CM | POA: Diagnosis not present

## 2017-04-03 DIAGNOSIS — R3981 Functional urinary incontinence: Secondary | ICD-10-CM | POA: Diagnosis not present

## 2017-04-03 DIAGNOSIS — R262 Difficulty in walking, not elsewhere classified: Secondary | ICD-10-CM | POA: Diagnosis not present

## 2017-04-03 DIAGNOSIS — R278 Other lack of coordination: Secondary | ICD-10-CM | POA: Diagnosis not present

## 2017-04-03 DIAGNOSIS — R2681 Unsteadiness on feet: Secondary | ICD-10-CM | POA: Diagnosis not present

## 2017-04-04 DIAGNOSIS — R3981 Functional urinary incontinence: Secondary | ICD-10-CM | POA: Diagnosis not present

## 2017-04-04 DIAGNOSIS — R278 Other lack of coordination: Secondary | ICD-10-CM | POA: Diagnosis not present

## 2017-04-04 DIAGNOSIS — R2681 Unsteadiness on feet: Secondary | ICD-10-CM | POA: Diagnosis not present

## 2017-04-04 DIAGNOSIS — M6281 Muscle weakness (generalized): Secondary | ICD-10-CM | POA: Diagnosis not present

## 2017-04-04 DIAGNOSIS — R262 Difficulty in walking, not elsewhere classified: Secondary | ICD-10-CM | POA: Diagnosis not present

## 2017-04-07 DIAGNOSIS — R278 Other lack of coordination: Secondary | ICD-10-CM | POA: Diagnosis not present

## 2017-04-07 DIAGNOSIS — R2681 Unsteadiness on feet: Secondary | ICD-10-CM | POA: Diagnosis not present

## 2017-04-07 DIAGNOSIS — R3981 Functional urinary incontinence: Secondary | ICD-10-CM | POA: Diagnosis not present

## 2017-04-07 DIAGNOSIS — M6281 Muscle weakness (generalized): Secondary | ICD-10-CM | POA: Diagnosis not present

## 2017-04-07 DIAGNOSIS — R262 Difficulty in walking, not elsewhere classified: Secondary | ICD-10-CM | POA: Diagnosis not present

## 2017-04-09 DIAGNOSIS — R2681 Unsteadiness on feet: Secondary | ICD-10-CM | POA: Diagnosis not present

## 2017-04-09 DIAGNOSIS — M6281 Muscle weakness (generalized): Secondary | ICD-10-CM | POA: Diagnosis not present

## 2017-04-09 DIAGNOSIS — R262 Difficulty in walking, not elsewhere classified: Secondary | ICD-10-CM | POA: Diagnosis not present

## 2017-04-09 DIAGNOSIS — R3981 Functional urinary incontinence: Secondary | ICD-10-CM | POA: Diagnosis not present

## 2017-04-09 DIAGNOSIS — R278 Other lack of coordination: Secondary | ICD-10-CM | POA: Diagnosis not present

## 2017-04-15 DIAGNOSIS — R262 Difficulty in walking, not elsewhere classified: Secondary | ICD-10-CM | POA: Diagnosis not present

## 2017-04-15 DIAGNOSIS — R278 Other lack of coordination: Secondary | ICD-10-CM | POA: Diagnosis not present

## 2017-04-15 DIAGNOSIS — R2681 Unsteadiness on feet: Secondary | ICD-10-CM | POA: Diagnosis not present

## 2017-04-15 DIAGNOSIS — R3981 Functional urinary incontinence: Secondary | ICD-10-CM | POA: Diagnosis not present

## 2017-04-15 DIAGNOSIS — M6281 Muscle weakness (generalized): Secondary | ICD-10-CM | POA: Diagnosis not present

## 2017-04-16 DIAGNOSIS — R262 Difficulty in walking, not elsewhere classified: Secondary | ICD-10-CM | POA: Diagnosis not present

## 2017-04-16 DIAGNOSIS — R3981 Functional urinary incontinence: Secondary | ICD-10-CM | POA: Diagnosis not present

## 2017-04-16 DIAGNOSIS — R278 Other lack of coordination: Secondary | ICD-10-CM | POA: Diagnosis not present

## 2017-04-16 DIAGNOSIS — R2681 Unsteadiness on feet: Secondary | ICD-10-CM | POA: Diagnosis not present

## 2017-04-16 DIAGNOSIS — M6281 Muscle weakness (generalized): Secondary | ICD-10-CM | POA: Diagnosis not present

## 2017-04-17 DIAGNOSIS — M6281 Muscle weakness (generalized): Secondary | ICD-10-CM | POA: Diagnosis not present

## 2017-04-17 DIAGNOSIS — R3981 Functional urinary incontinence: Secondary | ICD-10-CM | POA: Diagnosis not present

## 2017-04-17 DIAGNOSIS — R2681 Unsteadiness on feet: Secondary | ICD-10-CM | POA: Diagnosis not present

## 2017-04-17 DIAGNOSIS — R278 Other lack of coordination: Secondary | ICD-10-CM | POA: Diagnosis not present

## 2017-04-17 DIAGNOSIS — R262 Difficulty in walking, not elsewhere classified: Secondary | ICD-10-CM | POA: Diagnosis not present

## 2017-04-21 DIAGNOSIS — R2681 Unsteadiness on feet: Secondary | ICD-10-CM | POA: Diagnosis not present

## 2017-04-21 DIAGNOSIS — R262 Difficulty in walking, not elsewhere classified: Secondary | ICD-10-CM | POA: Diagnosis not present

## 2017-04-21 DIAGNOSIS — R278 Other lack of coordination: Secondary | ICD-10-CM | POA: Diagnosis not present

## 2017-04-21 DIAGNOSIS — M6281 Muscle weakness (generalized): Secondary | ICD-10-CM | POA: Diagnosis not present

## 2017-04-21 DIAGNOSIS — R3981 Functional urinary incontinence: Secondary | ICD-10-CM | POA: Diagnosis not present

## 2017-05-19 DIAGNOSIS — B351 Tinea unguium: Secondary | ICD-10-CM | POA: Diagnosis not present

## 2017-05-19 DIAGNOSIS — Q845 Enlarged and hypertrophic nails: Secondary | ICD-10-CM | POA: Diagnosis not present

## 2017-05-19 DIAGNOSIS — L603 Nail dystrophy: Secondary | ICD-10-CM | POA: Diagnosis not present

## 2017-05-19 DIAGNOSIS — I739 Peripheral vascular disease, unspecified: Secondary | ICD-10-CM | POA: Diagnosis not present

## 2017-05-21 ENCOUNTER — Encounter (HOSPITAL_COMMUNITY): Payer: Self-pay | Admitting: Emergency Medicine

## 2017-05-21 ENCOUNTER — Inpatient Hospital Stay (HOSPITAL_COMMUNITY)
Admission: EM | Admit: 2017-05-21 | Discharge: 2017-05-29 | DRG: 330 | Disposition: A | Discharge status: Skilled Nursing Facility | Payer: Medicare Other | Attending: Internal Medicine | Admitting: Internal Medicine

## 2017-05-21 DIAGNOSIS — Z981 Arthrodesis status: Secondary | ICD-10-CM

## 2017-05-21 DIAGNOSIS — R112 Nausea with vomiting, unspecified: Secondary | ICD-10-CM

## 2017-05-21 DIAGNOSIS — K420 Umbilical hernia with obstruction, without gangrene: Secondary | ICD-10-CM | POA: Diagnosis not present

## 2017-05-21 DIAGNOSIS — E039 Hypothyroidism, unspecified: Secondary | ICD-10-CM | POA: Diagnosis present

## 2017-05-21 DIAGNOSIS — K219 Gastro-esophageal reflux disease without esophagitis: Secondary | ICD-10-CM | POA: Diagnosis present

## 2017-05-21 DIAGNOSIS — Z87891 Personal history of nicotine dependence: Secondary | ICD-10-CM

## 2017-05-21 DIAGNOSIS — M797 Fibromyalgia: Secondary | ICD-10-CM | POA: Diagnosis present

## 2017-05-21 DIAGNOSIS — Z86010 Personal history of colonic polyps: Secondary | ICD-10-CM

## 2017-05-21 DIAGNOSIS — E871 Hypo-osmolality and hyponatremia: Secondary | ICD-10-CM | POA: Diagnosis not present

## 2017-05-21 DIAGNOSIS — B964 Proteus (mirabilis) (morganii) as the cause of diseases classified elsewhere: Secondary | ICD-10-CM | POA: Diagnosis present

## 2017-05-21 DIAGNOSIS — G4733 Obstructive sleep apnea (adult) (pediatric): Secondary | ICD-10-CM | POA: Diagnosis present

## 2017-05-21 DIAGNOSIS — Z886 Allergy status to analgesic agent status: Secondary | ICD-10-CM

## 2017-05-21 DIAGNOSIS — N179 Acute kidney failure, unspecified: Secondary | ICD-10-CM | POA: Diagnosis not present

## 2017-05-21 DIAGNOSIS — D62 Acute posthemorrhagic anemia: Secondary | ICD-10-CM | POA: Diagnosis not present

## 2017-05-21 DIAGNOSIS — Z79899 Other long term (current) drug therapy: Secondary | ICD-10-CM

## 2017-05-21 DIAGNOSIS — F0391 Unspecified dementia with behavioral disturbance: Secondary | ICD-10-CM | POA: Diagnosis present

## 2017-05-21 DIAGNOSIS — R000 Tachycardia, unspecified: Secondary | ICD-10-CM | POA: Diagnosis present

## 2017-05-21 DIAGNOSIS — I2510 Atherosclerotic heart disease of native coronary artery without angina pectoris: Secondary | ICD-10-CM | POA: Diagnosis present

## 2017-05-21 DIAGNOSIS — Z9049 Acquired absence of other specified parts of digestive tract: Secondary | ICD-10-CM

## 2017-05-21 DIAGNOSIS — Z9842 Cataract extraction status, left eye: Secondary | ICD-10-CM

## 2017-05-21 DIAGNOSIS — Z8249 Family history of ischemic heart disease and other diseases of the circulatory system: Secondary | ICD-10-CM

## 2017-05-21 DIAGNOSIS — Z923 Personal history of irradiation: Secondary | ICD-10-CM

## 2017-05-21 DIAGNOSIS — Z881 Allergy status to other antibiotic agents status: Secondary | ICD-10-CM

## 2017-05-21 DIAGNOSIS — N390 Urinary tract infection, site not specified: Secondary | ICD-10-CM | POA: Diagnosis present

## 2017-05-21 DIAGNOSIS — Z885 Allergy status to narcotic agent status: Secondary | ICD-10-CM

## 2017-05-21 DIAGNOSIS — E213 Hyperparathyroidism, unspecified: Secondary | ICD-10-CM | POA: Diagnosis present

## 2017-05-21 DIAGNOSIS — K436 Other and unspecified ventral hernia with obstruction, without gangrene: Secondary | ICD-10-CM | POA: Diagnosis present

## 2017-05-21 DIAGNOSIS — R413 Other amnesia: Secondary | ICD-10-CM | POA: Diagnosis present

## 2017-05-21 DIAGNOSIS — I10 Essential (primary) hypertension: Secondary | ICD-10-CM | POA: Diagnosis present

## 2017-05-21 DIAGNOSIS — Z0189 Encounter for other specified special examinations: Secondary | ICD-10-CM

## 2017-05-21 DIAGNOSIS — K56609 Unspecified intestinal obstruction, unspecified as to partial versus complete obstruction: Secondary | ICD-10-CM

## 2017-05-21 DIAGNOSIS — Z79890 Hormone replacement therapy (postmenopausal): Secondary | ICD-10-CM

## 2017-05-21 DIAGNOSIS — G4700 Insomnia, unspecified: Secondary | ICD-10-CM | POA: Diagnosis present

## 2017-05-21 DIAGNOSIS — F209 Schizophrenia, unspecified: Secondary | ICD-10-CM | POA: Diagnosis present

## 2017-05-21 DIAGNOSIS — K589 Irritable bowel syndrome without diarrhea: Secondary | ICD-10-CM | POA: Diagnosis present

## 2017-05-21 DIAGNOSIS — F03918 Unspecified dementia, unspecified severity, with other behavioral disturbance: Secondary | ICD-10-CM | POA: Diagnosis present

## 2017-05-21 DIAGNOSIS — E669 Obesity, unspecified: Secondary | ICD-10-CM | POA: Diagnosis present

## 2017-05-21 DIAGNOSIS — G2581 Restless legs syndrome: Secondary | ICD-10-CM | POA: Diagnosis present

## 2017-05-21 DIAGNOSIS — F319 Bipolar disorder, unspecified: Secondary | ICD-10-CM | POA: Diagnosis present

## 2017-05-21 DIAGNOSIS — Z6831 Body mass index (BMI) 31.0-31.9, adult: Secondary | ICD-10-CM

## 2017-05-21 DIAGNOSIS — Z85821 Personal history of Merkel cell carcinoma: Secondary | ICD-10-CM

## 2017-05-21 DIAGNOSIS — R030 Elevated blood-pressure reading, without diagnosis of hypertension: Secondary | ICD-10-CM | POA: Diagnosis not present

## 2017-05-21 DIAGNOSIS — C50512 Malignant neoplasm of lower-outer quadrant of left female breast: Secondary | ICD-10-CM | POA: Diagnosis present

## 2017-05-21 DIAGNOSIS — Z7982 Long term (current) use of aspirin: Secondary | ICD-10-CM

## 2017-05-21 DIAGNOSIS — Z9841 Cataract extraction status, right eye: Secondary | ICD-10-CM

## 2017-05-21 DIAGNOSIS — Z888 Allergy status to other drugs, medicaments and biological substances status: Secondary | ICD-10-CM

## 2017-05-21 DIAGNOSIS — E876 Hypokalemia: Secondary | ICD-10-CM | POA: Diagnosis present

## 2017-05-21 DIAGNOSIS — K5730 Diverticulosis of large intestine without perforation or abscess without bleeding: Secondary | ICD-10-CM | POA: Diagnosis not present

## 2017-05-21 DIAGNOSIS — E785 Hyperlipidemia, unspecified: Secondary | ICD-10-CM | POA: Diagnosis present

## 2017-05-21 DIAGNOSIS — J309 Allergic rhinitis, unspecified: Secondary | ICD-10-CM | POA: Diagnosis present

## 2017-05-21 DIAGNOSIS — Z7901 Long term (current) use of anticoagulants: Secondary | ICD-10-CM

## 2017-05-21 HISTORY — DX: Enterocolitis due to Clostridium difficile, not specified as recurrent: A04.72

## 2017-05-21 LAB — COMPREHENSIVE METABOLIC PANEL
ALK PHOS: 113 U/L (ref 38–126)
ALT: 20 U/L (ref 14–54)
AST: 26 U/L (ref 15–41)
Albumin: 3.8 g/dL (ref 3.5–5.0)
Anion gap: 18 — ABNORMAL HIGH (ref 5–15)
BILIRUBIN TOTAL: 0.7 mg/dL (ref 0.3–1.2)
BUN: 30 mg/dL — AB (ref 6–20)
CALCIUM: 9.6 mg/dL (ref 8.9–10.3)
CO2: 21 mmol/L — ABNORMAL LOW (ref 22–32)
CREATININE: 1.66 mg/dL — AB (ref 0.44–1.00)
Chloride: 104 mmol/L (ref 101–111)
GFR calc Af Amer: 32 mL/min — ABNORMAL LOW (ref >60)
GFR, EST NON AFRICAN AMERICAN: 28 mL/min — AB (ref >60)
Glucose, Bld: 164 mg/dL — ABNORMAL HIGH (ref 65–99)
Potassium: 4.7 mmol/L (ref 3.5–5.1)
Sodium: 143 mmol/L (ref 135–145)
TOTAL PROTEIN: 7.7 g/dL (ref 6.5–8.1)

## 2017-05-21 LAB — CBC WITH DIFFERENTIAL/PLATELET
BASOS ABS: 0 10*3/uL (ref 0.0–0.1)
Basophils Relative: 0 %
Eosinophils Absolute: 0 10*3/uL (ref 0.0–0.7)
Eosinophils Relative: 0 %
HEMATOCRIT: 36.4 % (ref 36.0–46.0)
HEMOGLOBIN: 10.9 g/dL — AB (ref 12.0–15.0)
LYMPHS PCT: 9 %
Lymphs Abs: 1.6 10*3/uL (ref 0.7–4.0)
MCH: 23.7 pg — ABNORMAL LOW (ref 26.0–34.0)
MCHC: 29.9 g/dL — ABNORMAL LOW (ref 30.0–36.0)
MCV: 79.3 fL (ref 78.0–100.0)
MONO ABS: 1.6 10*3/uL — AB (ref 0.1–1.0)
MONOS PCT: 9 %
NEUTROS ABS: 15.3 10*3/uL — AB (ref 1.7–7.7)
NEUTROS PCT: 82 %
Platelets: 425 10*3/uL — ABNORMAL HIGH (ref 150–400)
RBC: 4.59 MIL/uL (ref 3.87–5.11)
RDW: 17 % — ABNORMAL HIGH (ref 11.5–15.5)
WBC: 18.6 10*3/uL — ABNORMAL HIGH (ref 4.0–10.5)

## 2017-05-21 LAB — URINALYSIS, ROUTINE W REFLEX MICROSCOPIC
BILIRUBIN URINE: NEGATIVE
GLUCOSE, UA: NEGATIVE mg/dL
KETONES UR: 5 mg/dL — AB
Nitrite: NEGATIVE
PH: 5 (ref 5.0–8.0)
Protein, ur: 100 mg/dL — AB
RBC / HPF: >50 RBC/hpf — ABNORMAL HIGH (ref 0–5)
Specific Gravity, Urine: 1.024 (ref 1.005–1.030)
WBC, UA: >50 WBC/hpf — ABNORMAL HIGH (ref 0–5)

## 2017-05-21 LAB — LIPASE, BLOOD: LIPASE: 32 U/L (ref 11–51)

## 2017-05-21 MED ORDER — SODIUM CHLORIDE 0.9 % IV BOLUS
1000.0000 mL | Freq: Once | INTRAVENOUS | Status: AC
  Administered 2017-05-21: 1000 mL via INTRAVENOUS

## 2017-05-21 MED ORDER — CEPHALEXIN 500 MG PO CAPS: 500.0000 mg | ORAL_CAPSULE | Freq: Four times a day (QID) | ORAL | 0 refills | Status: DC

## 2017-05-21 MED ORDER — SODIUM CHLORIDE 0.9 % IV SOLN
1.0000 g | Freq: Once | INTRAVENOUS | Status: AC
  Administered 2017-05-21: 1 g via INTRAVENOUS
  Filled 2017-05-21: qty 10

## 2017-05-21 MED ORDER — ONDANSETRON HCL 4 MG/2ML IJ SOLN
4.0000 mg | Freq: Once | INTRAMUSCULAR | Status: AC
  Administered 2017-05-21: 4 mg via INTRAVENOUS
  Filled 2017-05-21: qty 2

## 2017-05-21 MED ORDER — IOHEXOL 300 MG/ML  SOLN
30.0000 mL | Freq: Once | INTRAMUSCULAR | Status: AC | PRN
  Administered 2017-05-21: 30 mL via ORAL

## 2017-05-21 NOTE — ED Triage Notes (Addendum)
Per EMS, pt. From [facility] with complaint of nausea and vomiting which started last night. Denied diarrhea, denied fever. Denied pain. Pt. Has dementia but oriented x2. reported of emesis x1 on board EMS.

## 2017-05-21 NOTE — ED Provider Notes (Addendum)
12:00 AM  Assumed care from Dr. Lacinda Axon.  Patient is an 84 year old female with history of dementia, hypertension, hyperlipidemia, atrial flutter on Xarelto who presents to the emergency department with abdominal pain, nausea and vomiting.  Found to have leukocytosis of 18,000 and mildly elevated creatinine of 1.66.  Does have a urinary tract infection.  Received IV Rocephin.  Culture pending.  CT abdomen pelvis pending for further evaluation.  1:15 AM  Pt's CT scan shows small bowel obstruction due to herniation at the mid ileum at a moderate periumbilical hernia with dilation of small bowel loops but no evidence of strangulation.  Also has bilateral lower airspace opacities that could be atelectasis versus infection.  On reevaluation patient continues to vomit.  She does have an umbilical hernia that I am unable to reduce.  No redness, warmth, necrosis over this area.  Patient is demented and unable to provide much history.  Caregiver unable to tell me if she has had any recent cough.  We will add azithromycin on given she has a leukocytosis and possible community-acquired pneumonia as well.  Caregiver will try to get in touch with her power of attorney Webb Silversmith.  I did discuss the case with Dr. [name] the hospitalist.  He recommends general surgery discussion first.  1:40 AM  Attempted to get in touch with patient's daughter Webb Silversmith at [phone].  Message has been left.   2:05 AM  D/w Dr. Johney Maine with general surgery who will see the patient in the ED.  I am unable to reduce patient's hernia despite multiple attempts.  Patient unfortunately is allergic to every narcotic pain medication causing N/V.  We will try a one-time dose of fentanyl to see if we can improve her pain so that we can possibly reduce her hernia.  Surgery has asked for medicine admission.    Discussed patient's case with hospitalist, Dr. [name].  I have recommended admission and patient (and family if present) agree with this plan. Admitting  physician will place admission orders.   I reviewed all nursing notes, vitals, pertinent previous records, EKGs, lab and urine results, imaging (as available).     Sammye Staff, Delice Bison, DO 05/22/17 0206    Dayane Hillenburg, Delice Bison, DO 05/22/17 0229    3:15 AM  Pt's hernia unable to be reduced by Dr. Johney Maine who has seen the patient in the ED.  Overlying skin is now purple appearing.  Unable to get in touch with her power of attorney despite multiple attempts.  Patient unable to consent given dementia but unfortunately this is a true emergency and patient needs to go to the operating room emergently.  We are assuming implied consent at this time.  Myself, general surgery and hospitalist agree.   Shiva Sahagian, Delice Bison, DO 05/22/17 [phone]

## 2017-05-21 NOTE — ED Notes (Addendum)
Pt. Under [employer] ; contact person is Hermina Staggers, can be contacted at this no. [phone] before 11pm this evening for any updates. Office no. Is [phone] and can be called at around 8am.

## 2017-05-21 NOTE — ED Provider Notes (Signed)
[facility] DEPT Provider Note   CSN: [id number] Arrival date & time: 05/21/17  2007     History   Chief Complaint Chief Complaint  Patient presents with  . Nausea  . Emesis    HPI HELAINE YACKEL is a 84 y.o. female.  Level 5 caveat for dementia.  Patient resides at [facility].  She complains of nausea, vomiting, lower abdominal pain since last night.  No diarrhea, fever, dysuria, chills, rest pain, dyspnea.  She has multiple health problems well documented in the past medical history.  She is pleasant and conversant.     Past Medical History:  Diagnosis Date  . Bipolar affective ([place])    History of psychosis  . C. difficile diarrhea   . CAD (coronary artery disease)    LAD 30% 2006  . Cerebrovascular disease   . Colonic polyp   . Crohn's disease ([place])   . Diverticulosis of colon   . Fibromyalgia   . GERD (gastroesophageal reflux disease)   . History of atrial flutter   . History of bronchitis   . History of headache   . History of hyperparathyroidism   . History of tuberculosis   . Hyperlipemia   . Hypertension   . Hypothyroidism   . IBS (irritable bowel syndrome)   . Memory disturbance   . 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 Obesity   . OSA (obstructive sleep apnea)    denies 12/07/15  . Osteopenia   . Other diseases of lung, not elsewhere classified   . Psychosis ([place])   . Radiation 05/2008   5000 cGy to left lower eyelid, parotid lymphatics and upper neck  . Restless legs syndrome (RLS)   . Syncope   . Tuberculosis     Patient Active Problem List   Diagnosis Date Noted  . Lymphedema of left arm 03/20/2016  . Arm edema 03/18/2016  . Breast cancer of lower-outer quadrant of left female breast ([place]) 11/19/2015  . Dementia with behavioral disturbance 04/27/2015  . Hypotension 02/16/2015  . Bradycardia   . Sinus bradycardia 02/03/2015  . Syncope 02/03/2015  . C. difficile colitis 11/26/2014  . Allergic  rhinitis 11/04/2013  . Insomnia 11/04/2013  . Senile dementia 10/22/2013  . C7 cervical fracture ([place]) 10/12/2013  . Obesity (BMI 30-39.9) 04/01/2013  . Osteoarthritis 12/16/2012  . Memory impairment 01/15/2012  . Low back pain 02/13/2011  . Low back strain 02/13/2011  . MERKEL CELL TUMOR 12/14/2009  . OTHER DISEASES OF LUNG NOT ELSEWHERE CLASSIFIED 12/14/2009  . RESTLESS LEG SYNDROME 10/16/2008  . H/O Psychosis in past, hospitaluized in '09, in ED Nov 2015 12/15/2007  . Regional enteritis ([place]) 12/15/2007  . Cerebrovascular disease 08/12/2007  . COLONIC POLYPS 04/29/2007  . Hypothyroidism 04/29/2007  . BRONCHITIS, RECURRENT 04/29/2007  . DIVERTICULOSIS OF COLON 04/29/2007  . Irritable bowel syndrome 04/29/2007  . Syncope and collapse 04/28/2007  . OBESITY 01/29/2007  . CAD- minor CAD 2006 01/29/2007  . H/O A flutter, s/p RFA 2005 01/29/2007  . GASTROESOPHAGEAL REFLUX DISEASE 01/29/2007  . OSTEOPENIA 01/29/2007  . HEADACHE, MIXED 01/29/2007  . Hyperlipidemia 01/12/2007  . OBSTRUCTIVE SLEEP APNEA 01/12/2007  . Essential hypertension 01/12/2007  . FIBROMYALGIA 01/12/2007  . HYPERPARATHYROIDISM, HX OF 10/21/2006    Past Surgical History:  Procedure Laterality Date  . APPENDECTOMY    . BLADDER SUSPENSION    . BREAST LUMPECTOMY WITH RADIOACTIVE SEED LOCALIZATION Left 12/08/2015   Procedure: BREAST LUMPECTOMY WITH RADIOACTIVE  SEED LOCALIZATION;  Surgeon: Autumn Messing III, MD;  Location: [facility];  Service: General;  Laterality: Left;  . CATARACT EXTRACTION Bilateral   . CHOLECYSTECTOMY    . eye lid surgery  04/04/2011   [facility] by Dr. Norlene Duel  . HEEL SPUR EXCISION    . INCISION AND DRAINAGE PERIRECTAL ABSCESS    . LUMBAR LAMINECTOMY    . Moh';s surg left lower eye lid surgery and lid reconstruction surgery  2/10  . NASAL SINUS SURGERY Right    Right maxillary  . Parathyroid adenoma surgery    . POSTERIOR CERVICAL FUSION/FORAMINOTOMY N/A 10/12/2013   Procedure: Cervical  five-Thoracic two cervico-thoracic posterior fusion;  Surgeon: [name], MD;  Location: [facility];  Service: Neurosurgery;  Laterality: N/A;  . TONSILLECTOMY AND ADENOIDECTOMY       OB History   None      Home Medications    Prior to Admission medications   Medication Sig Start Date End Date Taking? Authorizing Provider  amLODipine (NORVASC) 5 MG tablet TAKE 1 TABLET (5 MG TOTAL) BY MOUTH DAILY. 02/19/16  Yes Burchette, Alinda Sierras, MD  anastrozole (ARIMIDEX) 1 MG tablet Take 1 tablet (1 mg total) by mouth daily. 07/22/16  Yes [name], MD  aspirin 81 MG EC tablet Take 81 mg by mouth. 04/05/08  Yes [provider]  atorvastatin (LIPITOR) 40 MG tablet TAKE 1 TABLET DAILY 03/04/16  Yes Burchette, Alinda Sierras, MD  buPROPion (WELLBUTRIN XL) 150 MG 24 hr tablet Take 150 mg by mouth daily.   Yes [provider]  dicyclomine (BENTYL) 10 MG capsule Take 1 capsule (10 mg total) by mouth 3 (three) times daily before meals. Patient taking differently: Take 10 mg by mouth 2 (two) times daily.  03/12/15  Yes Harris, Abigail, PA-C  donepezil (ARICEPT) 5 MG tablet Take 5 mg by mouth at bedtime.   Yes [provider]  levothyroxine (SYNTHROID, LEVOTHROID) 88 MCG tablet Take 1 tablet (88 mcg total) by mouth daily before breakfast. Pt needs an appointment for further refills 01/09/16  Yes Burchette, Alinda Sierras, MD  memantine (NAMENDA) 10 MG tablet TAKE 1 TABLET (10 MG TOTAL) BY MOUTH TWO  TIMES DAILY. 09/04/16  Yes Burchette, Alinda Sierras, MD  Multiple Vitamin (MULTIVITAMIN WITH MINERALS) TABS tablet Take 1 tablet by mouth daily.   Yes [provider]  Propylene Glycol (SYSTANE BALANCE) 0.6 % SOLN Place 1 drop into both eyes daily.   Yes [provider]  risperiDONE (RISPERDAL) 1 MG tablet Take 0.5 tablets (0.5 mg total) at bedtime by mouth. 11/20/16  Yes Burchette, Alinda Sierras, MD  Calcium 500 MG tablet Take 2 tabs by mouth daily at 6 pm Patient not taking: Reported on 05/21/2017  03/14/16   [name], MD  cephALEXin (KEFLEX) 500 MG capsule Take 1 capsule (500 mg total) by mouth 4 (four) times daily. 05/21/17   Nat Christen, MD  Cholecalciferol (VITAMIN D3) 2000 units TABS Take 2,000 Units by mouth daily at 12 noon. Patient not taking: Reported on 05/21/2017 03/14/16   [name], MD  doxycycline (VIBRA-TABS) 100 MG tablet Take 1 tablet (100 mg total) by mouth 2 (two) times daily. Patient not taking: Reported on 05/21/2017 12/27/16   Lucretia Kern, DO  Ergocalciferol (VITAMIN D2) 2000 units TABS Take one tablet daily.    [provider]  fluticasone (FLONASE) 50 MCG/ACT nasal spray Place 2 sprays into both nostrils at bedtime. Patient not taking: Reported on 05/21/2017 02/28/15  Burchette, Alinda Sierras, MD  Melatonin 10 MG TABS Take 10 mg by mouth 1 day or 1 dose. Patient not taking: Reported on 05/21/2017 02/28/15   [name], MD  mesalamine (LIALDA) 1.2 g EC tablet Take 3 tablets (3.6 g total) by mouth daily with lunch. Patient not taking: Reported on 05/21/2017 10/09/16   [name], MD  rivaroxaban (XARELTO) 20 MG TABS tablet TAKE 1 TABLET DAILY WITH SUPPER Patient not taking: Reported on 05/21/2017 11/20/16   [name], MD    Family History Family History  Problem Relation Age of Onset  . Heart failure Father   . Arthritis Mother   . Hypertension Mother   . Ulcers Mother   . Colon cancer Maternal Grandmother   . Pulmonary embolism Unknown     Social History Social History   Tobacco Use  . Smoking status: Former Smoker    Years: 6.00    Types: Cigarettes    Last attempt to quit: 01/08/1960    Years since quitting: 57.4  . Smokeless tobacco: Never Used  Substance Use Topics  . Alcohol use: No  . Drug use: No     Allergies   Prednisone; Carbamazepine; Codeine; Demerol [meperidine]; Diclofenac sodium; Fentanyl; Fluoxetine hcl; Gentamicin; Hydromorphone hcl; Ibuprofen; Indomethacin; Lisinopril; Meperidine hcl;  Morphine; Pregabalin; Propranolol hcl; Refresh lacri-lube [eye lubricant]; Ropinirole hcl; Sulfamethoxazole; White petrolatum-mineral oil  [aquaphilic]; and Bacitracin   Review of Systems Review of Systems  Unable to perform ROS: Dementia     Physical Exam Updated Vital Signs BP (!) 149/61   Pulse 93   Temp 99 F (37.2 C) (Oral)   Resp 17   SpO2 92%   Physical Exam  Constitutional:  NAD  HENT:  Head: Normocephalic and atraumatic.  Eyes: Conjunctivae are normal.  Neck: Neck supple.  Cardiovascular: Normal rate and regular rhythm.  Pulmonary/Chest: Effort normal and breath sounds normal.  Abdominal: Bowel sounds are normal.  Minimal lower abdominal tenderness.  Musculoskeletal: Normal range of motion.  Neurological: She is alert.  Skin: Skin is warm and dry.  Psychiatric:  Flat affect  Nursing note and vitals reviewed.    ED Treatments / Results  Labs (all labs ordered are listed, but only abnormal results are displayed) Labs Reviewed  CBC WITH DIFFERENTIAL/PLATELET - Abnormal; Notable for the following components:      Result Value   WBC 18.6 (*)    Hemoglobin 10.9 (*)    MCH 23.7 (*)    MCHC 29.9 (*)    RDW 17.0 (*)    Platelets 425 (*)    Neutro Abs 15.3 (*)    Monocytes Absolute 1.6 (*)    All other components within normal limits  COMPREHENSIVE METABOLIC PANEL - Abnormal; Notable for the following components:   CO2 21 (*)    Glucose, Bld 164 (*)    BUN 30 (*)    Creatinine, Ser 1.66 (*)    GFR calc non Af Amer 28 (*)    GFR calc Af Amer 32 (*)    Anion gap 18 (*)    All other components within normal limits  URINALYSIS, ROUTINE W REFLEX MICROSCOPIC - Abnormal; Notable for the following components:   Color, Urine AMBER (*)    APPearance CLOUDY (*)    Hgb urine dipstick LARGE (*)    Ketones, ur 5 (*)    Protein, ur 100 (*)    Leukocytes, UA LARGE (*)    RBC / HPF >50 (*)  WBC, UA >50 (*)    Bacteria, UA FEW (*)    All other components within  normal limits  URINE CULTURE  LIPASE, BLOOD    EKG None  Radiology No results found.  Procedures Procedures (including critical care time)  Medications Ordered in ED Medications  cefTRIAXone (ROCEPHIN) 1 g in sodium chloride 0.9 % 100 mL IVPB (1 g Intravenous New Bag/Given 05/21/17 2347)  sodium chloride 0.9 % bolus 1,000 mL (0 mLs Intravenous Stopped 05/21/17 2251)  ondansetron (ZOFRAN) injection 4 mg (4 mg Intravenous Given 05/21/17 2057)  iohexol (OMNIPAQUE) 300 MG/ML solution 30 mL (30 mLs Oral Contrast Given 05/21/17 2226)     Initial Impression / Assessment and Plan / ED Course  I have reviewed the triage vital signs and the nursing notes.  Pertinent labs & imaging results that were available during my care of the patient were reviewed by me and considered in my medical decision making (see chart for details).     Patient presents with nausea, vomiting, lower abdominal pain.  She is nontoxic-appearing.  White count 18.6.  Urinalysis shows obvious infection.  Will hydrate, IV Rocephin, CT abdomen pelvis (pending).  Discussed with Dr. [name]. Final Clinical Impressions(s) / ED Diagnoses   Final diagnoses:  Abdominal pain, unspecified abdominal location  Urinary tract infection without hematuria, site unspecified    ED Discharge Orders        Ordered    cephALEXin (KEFLEX) 500 MG capsule  4 times daily     05/21/17 2349       Nat Christen, MD 05/21/17 2355

## 2017-05-22 ENCOUNTER — Inpatient Hospital Stay (HOSPITAL_COMMUNITY): Payer: Medicare Other | Admitting: Certified Registered"

## 2017-05-22 ENCOUNTER — Emergency Department (HOSPITAL_COMMUNITY): Payer: Medicare Other

## 2017-05-22 ENCOUNTER — Encounter (HOSPITAL_COMMUNITY): Payer: Self-pay | Admitting: Surgery

## 2017-05-22 ENCOUNTER — Encounter (HOSPITAL_COMMUNITY)
Admission: EM | Disposition: A | Discharge status: Skilled Nursing Facility | Payer: Self-pay | Source: Home / Self Care | Attending: Internal Medicine

## 2017-05-22 ENCOUNTER — Other Ambulatory Visit: Payer: Self-pay

## 2017-05-22 DIAGNOSIS — R2689 Other abnormalities of gait and mobility: Secondary | ICD-10-CM | POA: Diagnosis not present

## 2017-05-22 DIAGNOSIS — H04129 Dry eye syndrome of unspecified lacrimal gland: Secondary | ICD-10-CM | POA: Diagnosis not present

## 2017-05-22 DIAGNOSIS — B3789 Other sites of candidiasis: Secondary | ICD-10-CM | POA: Diagnosis not present

## 2017-05-22 DIAGNOSIS — K436 Other and unspecified ventral hernia with obstruction, without gangrene: Secondary | ICD-10-CM | POA: Diagnosis not present

## 2017-05-22 DIAGNOSIS — G301 Alzheimer's disease with late onset: Secondary | ICD-10-CM | POA: Diagnosis not present

## 2017-05-22 DIAGNOSIS — F0391 Unspecified dementia with behavioral disturbance: Secondary | ICD-10-CM | POA: Diagnosis present

## 2017-05-22 DIAGNOSIS — Z111 Encounter for screening for respiratory tuberculosis: Secondary | ICD-10-CM | POA: Diagnosis not present

## 2017-05-22 DIAGNOSIS — Z48815 Encounter for surgical aftercare following surgery on the digestive system: Secondary | ICD-10-CM | POA: Diagnosis not present

## 2017-05-22 DIAGNOSIS — K589 Irritable bowel syndrome without diarrhea: Secondary | ICD-10-CM | POA: Diagnosis present

## 2017-05-22 DIAGNOSIS — Z0189 Encounter for other specified special examinations: Secondary | ICD-10-CM | POA: Diagnosis not present

## 2017-05-22 DIAGNOSIS — Z743 Need for continuous supervision: Secondary | ICD-10-CM | POA: Diagnosis not present

## 2017-05-22 DIAGNOSIS — K56609 Unspecified intestinal obstruction, unspecified as to partial versus complete obstruction: Secondary | ICD-10-CM

## 2017-05-22 DIAGNOSIS — N390 Urinary tract infection, site not specified: Secondary | ICD-10-CM

## 2017-05-22 DIAGNOSIS — K55029 Acute infarction of small intestine, extent unspecified: Secondary | ICD-10-CM | POA: Diagnosis not present

## 2017-05-22 DIAGNOSIS — G4700 Insomnia, unspecified: Secondary | ICD-10-CM | POA: Diagnosis present

## 2017-05-22 DIAGNOSIS — M797 Fibromyalgia: Secondary | ICD-10-CM | POA: Diagnosis present

## 2017-05-22 DIAGNOSIS — K581 Irritable bowel syndrome with constipation: Secondary | ICD-10-CM | POA: Diagnosis not present

## 2017-05-22 DIAGNOSIS — D62 Acute posthemorrhagic anemia: Secondary | ICD-10-CM | POA: Diagnosis not present

## 2017-05-22 DIAGNOSIS — R278 Other lack of coordination: Secondary | ICD-10-CM | POA: Diagnosis not present

## 2017-05-22 DIAGNOSIS — J309 Allergic rhinitis, unspecified: Secondary | ICD-10-CM | POA: Diagnosis present

## 2017-05-22 DIAGNOSIS — I4891 Unspecified atrial fibrillation: Secondary | ICD-10-CM | POA: Diagnosis not present

## 2017-05-22 DIAGNOSIS — R279 Unspecified lack of coordination: Secondary | ICD-10-CM | POA: Diagnosis not present

## 2017-05-22 DIAGNOSIS — E569 Vitamin deficiency, unspecified: Secondary | ICD-10-CM | POA: Diagnosis not present

## 2017-05-22 DIAGNOSIS — K5730 Diverticulosis of large intestine without perforation or abscess without bleeding: Secondary | ICD-10-CM | POA: Diagnosis not present

## 2017-05-22 DIAGNOSIS — E785 Hyperlipidemia, unspecified: Secondary | ICD-10-CM | POA: Diagnosis present

## 2017-05-22 DIAGNOSIS — E039 Hypothyroidism, unspecified: Secondary | ICD-10-CM | POA: Diagnosis present

## 2017-05-22 DIAGNOSIS — N179 Acute kidney failure, unspecified: Secondary | ICD-10-CM | POA: Diagnosis not present

## 2017-05-22 DIAGNOSIS — G4733 Obstructive sleep apnea (adult) (pediatric): Secondary | ICD-10-CM | POA: Diagnosis not present

## 2017-05-22 DIAGNOSIS — B964 Proteus (mirabilis) (morganii) as the cause of diseases classified elsewhere: Secondary | ICD-10-CM | POA: Diagnosis present

## 2017-05-22 DIAGNOSIS — K5900 Constipation, unspecified: Secondary | ICD-10-CM | POA: Diagnosis not present

## 2017-05-22 DIAGNOSIS — K5090 Crohn's disease, unspecified, without complications: Secondary | ICD-10-CM | POA: Insufficient documentation

## 2017-05-22 DIAGNOSIS — R000 Tachycardia, unspecified: Secondary | ICD-10-CM | POA: Diagnosis present

## 2017-05-22 DIAGNOSIS — K420 Umbilical hernia with obstruction, without gangrene: Secondary | ICD-10-CM | POA: Diagnosis not present

## 2017-05-22 DIAGNOSIS — E876 Hypokalemia: Secondary | ICD-10-CM | POA: Diagnosis present

## 2017-05-22 DIAGNOSIS — E669 Obesity, unspecified: Secondary | ICD-10-CM | POA: Diagnosis present

## 2017-05-22 DIAGNOSIS — I10 Essential (primary) hypertension: Secondary | ICD-10-CM | POA: Diagnosis not present

## 2017-05-22 DIAGNOSIS — E213 Hyperparathyroidism, unspecified: Secondary | ICD-10-CM | POA: Diagnosis present

## 2017-05-22 DIAGNOSIS — K5669 Other partial intestinal obstruction: Secondary | ICD-10-CM | POA: Diagnosis not present

## 2017-05-22 DIAGNOSIS — R52 Pain, unspecified: Secondary | ICD-10-CM | POA: Diagnosis not present

## 2017-05-22 DIAGNOSIS — E871 Hypo-osmolality and hyponatremia: Secondary | ICD-10-CM | POA: Diagnosis not present

## 2017-05-22 DIAGNOSIS — F319 Bipolar disorder, unspecified: Secondary | ICD-10-CM | POA: Diagnosis present

## 2017-05-22 DIAGNOSIS — K219 Gastro-esophageal reflux disease without esophagitis: Secondary | ICD-10-CM | POA: Diagnosis present

## 2017-05-22 DIAGNOSIS — C50512 Malignant neoplasm of lower-outer quadrant of left female breast: Secondary | ICD-10-CM | POA: Diagnosis present

## 2017-05-22 DIAGNOSIS — I2510 Atherosclerotic heart disease of native coronary artery without angina pectoris: Secondary | ICD-10-CM | POA: Diagnosis present

## 2017-05-22 DIAGNOSIS — F0281 Dementia in other diseases classified elsewhere with behavioral disturbance: Secondary | ICD-10-CM

## 2017-05-22 DIAGNOSIS — M6281 Muscle weakness (generalized): Secondary | ICD-10-CM | POA: Diagnosis not present

## 2017-05-22 DIAGNOSIS — G2581 Restless legs syndrome: Secondary | ICD-10-CM | POA: Diagnosis present

## 2017-05-22 HISTORY — PX: LAPAROTOMY: SHX154

## 2017-05-22 LAB — CBC
HEMATOCRIT: 32.2 % — AB (ref 36.0–46.0)
HEMOGLOBIN: 9.4 g/dL — AB (ref 12.0–15.0)
MCH: 23.3 pg — AB (ref 26.0–34.0)
MCHC: 29.2 g/dL — AB (ref 30.0–36.0)
MCV: 79.7 fL (ref 78.0–100.0)
Platelets: 345 10*3/uL (ref 150–400)
RBC: 4.04 MIL/uL (ref 3.87–5.11)
RDW: 17.2 % — AB (ref 11.5–15.5)
WBC: 8.2 10*3/uL (ref 4.0–10.5)

## 2017-05-22 LAB — BASIC METABOLIC PANEL
Anion gap: 14 (ref 5–15)
BUN: 35 mg/dL — AB (ref 6–20)
CALCIUM: 8.6 mg/dL — AB (ref 8.9–10.3)
CHLORIDE: 104 mmol/L (ref 101–111)
CO2: 24 mmol/L (ref 22–32)
CREATININE: 1.82 mg/dL — AB (ref 0.44–1.00)
GFR calc Af Amer: 29 mL/min — ABNORMAL LOW (ref >60)
GFR calc non Af Amer: 25 mL/min — ABNORMAL LOW (ref >60)
GLUCOSE: 156 mg/dL — AB (ref 65–99)
Potassium: 4.7 mmol/L (ref 3.5–5.1)
Sodium: 142 mmol/L (ref 135–145)

## 2017-05-22 LAB — MRSA PCR SCREENING: MRSA BY PCR: NEGATIVE

## 2017-05-22 SURGERY — LAPAROTOMY, EXPLORATORY
Anesthesia: General | Site: Abdomen

## 2017-05-22 MED ORDER — HYDROCORTISONE 2.5 % RE CREA: 1.0000 "application " | TOPICAL_CREAM | Freq: Four times a day (QID) | RECTAL | Status: DC | PRN

## 2017-05-22 MED ORDER — POLYVINYL ALCOHOL 1.4 % OP SOLN
1.0000 [drp] | OPHTHALMIC | Status: DC | PRN
  Administered 2017-05-22 – 2017-05-25 (×2): 1 [drp] via OPHTHALMIC
  Filled 2017-05-22: qty 15

## 2017-05-22 MED ORDER — LEVOTHYROXINE SODIUM 88 MCG PO TABS: 88.0000 ug | ORAL_TABLET | Freq: Every day | ORAL | Status: DC

## 2017-05-22 MED ORDER — BUPIVACAINE LIPOSOME 1.3 % IJ SUSP
INTRAMUSCULAR | Status: DC | PRN
  Administered 2017-05-22: 20 mL

## 2017-05-22 MED ORDER — SODIUM CHLORIDE 0.9 % IV SOLN
1.0000 g | INTRAVENOUS | Status: DC
  Administered 2017-05-22 – 2017-05-26 (×5): 1 g via INTRAVENOUS
  Filled 2017-05-22 (×4): qty 1
  Filled 2017-05-22: qty 10
  Filled 2017-05-22: qty 1

## 2017-05-22 MED ORDER — SODIUM CHLORIDE 0.9 % IV SOLN
INTRAVENOUS | Status: DC
  Administered 2017-05-22 – 2017-05-23 (×4): via INTRAVENOUS
  Administered 2017-05-23: 100 mL/h via INTRAVENOUS
  Administered 2017-05-23 – 2017-05-24 (×2): via INTRAVENOUS

## 2017-05-22 MED ORDER — ALUM & MAG HYDROXIDE-SIMETH 200-200-20 MG/5ML PO SUSP: 30.0000 mL | Freq: Four times a day (QID) | ORAL | Status: DC | PRN

## 2017-05-22 MED ORDER — METHOCARBAMOL 1000 MG/10ML IJ SOLN
1000.0000 mg | Freq: Four times a day (QID) | INTRAMUSCULAR | Status: DC | PRN
  Filled 2017-05-22: qty 10

## 2017-05-22 MED ORDER — PROMETHAZINE HCL 25 MG/ML IJ SOLN: 6.2500 mg | INTRAMUSCULAR | Status: DC | PRN

## 2017-05-22 MED ORDER — ONDANSETRON HCL 4 MG/2ML IJ SOLN
4.0000 mg | Freq: Four times a day (QID) | INTRAMUSCULAR | Status: DC | PRN
  Administered 2017-05-27: 4 mg via INTRAVENOUS
  Filled 2017-05-22: qty 2

## 2017-05-22 MED ORDER — METOCLOPRAMIDE HCL 5 MG/ML IJ SOLN: 10.0000 mg | Freq: Four times a day (QID) | INTRAMUSCULAR | Status: DC | PRN

## 2017-05-22 MED ORDER — GUAIFENESIN-DM 100-10 MG/5ML PO SYRP: 10.0000 mL | ORAL_SOLUTION | ORAL | Status: DC | PRN

## 2017-05-22 MED ORDER — DONEPEZIL HCL 5 MG PO TABS: 5.0000 mg | ORAL_TABLET | Freq: Every day | ORAL | Status: DC

## 2017-05-22 MED ORDER — FENTANYL CITRATE (PF) 100 MCG/2ML IJ SOLN
50.0000 ug | Freq: Once | INTRAMUSCULAR | Status: AC
  Administered 2017-05-22: 50 ug via INTRAVENOUS
  Filled 2017-05-22: qty 2

## 2017-05-22 MED ORDER — AMLODIPINE BESYLATE 5 MG PO TABS: 5.0000 mg | ORAL_TABLET | Freq: Every day | ORAL | Status: DC

## 2017-05-22 MED ORDER — FAMOTIDINE IN NACL 20-0.9 MG/50ML-% IV SOLN
20.0000 mg | INTRAVENOUS | Status: DC
  Administered 2017-05-22 – 2017-05-27 (×6): 20 mg via INTRAVENOUS
  Filled 2017-05-22 (×6): qty 50

## 2017-05-22 MED ORDER — SODIUM CHLORIDE 0.9 % IV SOLN
500.0000 mg | Freq: Once | INTRAVENOUS | Status: AC
  Administered 2017-05-22: 500 mg via INTRAVENOUS
  Filled 2017-05-22: qty 500

## 2017-05-22 MED ORDER — LEVOTHYROXINE SODIUM 100 MCG IV SOLR
44.0000 ug | Freq: Every day | INTRAVENOUS | Status: DC
  Administered 2017-05-22 – 2017-05-27 (×6): 44 ug via INTRAVENOUS
  Filled 2017-05-22 (×6): qty 5

## 2017-05-22 MED ORDER — PHENYLEPHRINE 40 MCG/ML (10ML) SYRINGE FOR IV PUSH (FOR BLOOD PRESSURE SUPPORT)
PREFILLED_SYRINGE | INTRAVENOUS | Status: DC | PRN
  Administered 2017-05-22 (×2): 80 ug via INTRAVENOUS

## 2017-05-22 MED ORDER — FENTANYL CITRATE (PF) 100 MCG/2ML IJ SOLN
INTRAMUSCULAR | Status: DC | PRN
  Administered 2017-05-22 (×5): 50 ug via INTRAVENOUS

## 2017-05-22 MED ORDER — ONDANSETRON HCL 4 MG PO TABS: 4.0000 mg | ORAL_TABLET | Freq: Four times a day (QID) | ORAL | Status: DC | PRN

## 2017-05-22 MED ORDER — 0.9 % SODIUM CHLORIDE (POUR BTL) OPTIME
TOPICAL | Status: DC | PRN
  Administered 2017-05-22: 2000 mL

## 2017-05-22 MED ORDER — SUGAMMADEX SODIUM 200 MG/2ML IV SOLN
INTRAVENOUS | Status: DC | PRN
  Administered 2017-05-22: 200 mg via INTRAVENOUS

## 2017-05-22 MED ORDER — LACTATED RINGERS IV SOLN
INTRAVENOUS | Status: DC | PRN
  Administered 2017-05-22: 04:00:00 via INTRAVENOUS

## 2017-05-22 MED ORDER — CHLORHEXIDINE GLUCONATE CLOTH 2 % EX PADS: 6.0000 | MEDICATED_PAD | Freq: Once | CUTANEOUS | Status: DC

## 2017-05-22 MED ORDER — LIDOCAINE 2% (20 MG/ML) 5 ML SYRINGE
INTRAMUSCULAR | Status: DC | PRN
  Administered 2017-05-22: 100 mg via INTRAVENOUS

## 2017-05-22 MED ORDER — MEMANTINE HCL 10 MG PO TABS
10.0000 mg | ORAL_TABLET | Freq: Two times a day (BID) | ORAL | Status: DC
  Filled 2017-05-22: qty 1

## 2017-05-22 MED ORDER — ACETAMINOPHEN 325 MG PO TABS: 650.0000 mg | ORAL_TABLET | Freq: Four times a day (QID) | ORAL | Status: DC | PRN

## 2017-05-22 MED ORDER — BUPROPION HCL 75 MG PO TABS
150.0000 mg | ORAL_TABLET | Freq: Every day | ORAL | Status: DC
  Filled 2017-05-22: qty 2

## 2017-05-22 MED ORDER — ONDANSETRON HCL 4 MG/2ML IJ SOLN
4.0000 mg | Freq: Once | INTRAMUSCULAR | Status: AC
  Administered 2017-05-22: 4 mg via INTRAVENOUS
  Filled 2017-05-22: qty 2

## 2017-05-22 MED ORDER — PROPYLENE GLYCOL 0.6 % OP SOLN: 1.0000 [drp] | Freq: Every day | OPHTHALMIC | Status: DC

## 2017-05-22 MED ORDER — PROCHLORPERAZINE EDISYLATE 10 MG/2ML IJ SOLN: 5.0000 mg | INTRAMUSCULAR | Status: DC | PRN

## 2017-05-22 MED ORDER — BISACODYL 10 MG RE SUPP: 10.0000 mg | Freq: Two times a day (BID) | RECTAL | Status: DC | PRN

## 2017-05-22 MED ORDER — SODIUM CHLORIDE 0.9 % IV SOLN
2.0000 g | INTRAVENOUS | Status: AC
  Administered 2017-05-22: 2 g via INTRAVENOUS

## 2017-05-22 MED ORDER — LIP MEDEX EX OINT
1.0000 "application " | TOPICAL_OINTMENT | Freq: Two times a day (BID) | CUTANEOUS | Status: DC
  Administered 2017-05-22 – 2017-05-28 (×13): 1 via TOPICAL
  Filled 2017-05-22: qty 7

## 2017-05-22 MED ORDER — HYDROMORPHONE HCL 1 MG/ML IJ SOLN
0.2500 mg | INTRAMUSCULAR | Status: DC | PRN
Start: 2017-05-22 — End: 2017-05-22

## 2017-05-22 MED ORDER — PROPOFOL 10 MG/ML IV BOLUS
INTRAVENOUS | Status: DC | PRN
  Administered 2017-05-22: 120 mg via INTRAVENOUS

## 2017-05-22 MED ORDER — PHENOL 1.4 % MT LIQD: 1.0000 | OROMUCOSAL | Status: DC | PRN

## 2017-05-22 MED ORDER — LACTATED RINGERS IV BOLUS: 1000.0000 mL | Freq: Three times a day (TID) | INTRAVENOUS | Status: DC | PRN

## 2017-05-22 MED ORDER — ROCURONIUM BROMIDE 10 MG/ML (PF) SYRINGE
PREFILLED_SYRINGE | INTRAVENOUS | Status: DC | PRN
  Administered 2017-05-22: 30 mg via INTRAVENOUS
  Administered 2017-05-22: 10 mg via INTRAVENOUS

## 2017-05-22 MED ORDER — ACETAMINOPHEN 650 MG RE SUPP: 650.0000 mg | Freq: Four times a day (QID) | RECTAL | Status: DC | PRN

## 2017-05-22 MED ORDER — FENTANYL CITRATE (PF) 250 MCG/5ML IJ SOLN
INTRAMUSCULAR | Status: AC
  Filled 2017-05-22: qty 5

## 2017-05-22 MED ORDER — CEFOTETAN DISODIUM-DEXTROSE 2-2.08 GM-%(50ML) IV SOLR
INTRAVENOUS | Status: AC
  Filled 2017-05-22: qty 50

## 2017-05-22 MED ORDER — BISACODYL 10 MG RE SUPP
10.0000 mg | Freq: Every day | RECTAL | Status: DC
  Administered 2017-05-22 – 2017-05-26 (×5): 10 mg via RECTAL
  Filled 2017-05-22 (×6): qty 1

## 2017-05-22 MED ORDER — MAGIC MOUTHWASH
15.0000 mL | Freq: Four times a day (QID) | ORAL | Status: DC | PRN
  Filled 2017-05-22: qty 15

## 2017-05-22 MED ORDER — ONDANSETRON HCL 4 MG/2ML IJ SOLN
INTRAMUSCULAR | Status: DC | PRN
  Administered 2017-05-22: 4 mg via INTRAVENOUS

## 2017-05-22 MED ORDER — FENTANYL CITRATE (PF) 100 MCG/2ML IJ SOLN: 12.5000 ug | INTRAMUSCULAR | Status: DC | PRN

## 2017-05-22 MED ORDER — DIATRIZOATE MEGLUMINE & SODIUM 66-10 % PO SOLN: 90.0000 mL | Freq: Once | ORAL | Status: DC

## 2017-05-22 MED ORDER — RISPERIDONE 0.5 MG PO TABS: 0.5000 mg | ORAL_TABLET | Freq: Every day | ORAL | Status: DC

## 2017-05-22 MED ORDER — PHENYLEPHRINE 40 MCG/ML (10ML) SYRINGE FOR IV PUSH (FOR BLOOD PRESSURE SUPPORT)
PREFILLED_SYRINGE | INTRAVENOUS | Status: AC
  Filled 2017-05-22: qty 10

## 2017-05-22 MED ORDER — ANASTROZOLE 1 MG PO TABS
1.0000 mg | ORAL_TABLET | Freq: Every day | ORAL | Status: DC
  Filled 2017-05-22: qty 1

## 2017-05-22 MED ORDER — BUPROPION HCL ER (XL) 150 MG PO TB24: 150.0000 mg | ORAL_TABLET | Freq: Every day | ORAL | Status: DC

## 2017-05-22 MED ORDER — ONDANSETRON HCL 4 MG/2ML IJ SOLN
INTRAMUSCULAR | Status: AC
  Filled 2017-05-22: qty 2

## 2017-05-22 MED ORDER — HYDROMORPHONE HCL 1 MG/ML IJ SOLN
0.5000 mg | INTRAMUSCULAR | Status: DC | PRN
  Administered 2017-05-22: 1 mg via INTRAVENOUS
  Filled 2017-05-22: qty 1

## 2017-05-22 MED ORDER — DIPHENHYDRAMINE HCL 50 MG/ML IJ SOLN: 12.5000 mg | Freq: Four times a day (QID) | INTRAMUSCULAR | Status: DC | PRN

## 2017-05-22 MED ORDER — DICYCLOMINE HCL 10 MG PO CAPS
10.0000 mg | ORAL_CAPSULE | Freq: Two times a day (BID) | ORAL | Status: DC
  Filled 2017-05-22: qty 1

## 2017-05-22 MED ORDER — MENTHOL 3 MG MT LOZG: 1.0000 | LOZENGE | OROMUCOSAL | Status: DC | PRN

## 2017-05-22 MED ORDER — BUPIVACAINE-EPINEPHRINE 0.5% -1:200000 IJ SOLN
INTRAMUSCULAR | Status: DC | PRN
  Administered 2017-05-22: 30 mL

## 2017-05-22 MED ORDER — METOPROLOL TARTRATE 5 MG/5ML IV SOLN
5.0000 mg | Freq: Four times a day (QID) | INTRAVENOUS | Status: DC | PRN
  Administered 2017-05-22 – 2017-05-23 (×2): 5 mg via INTRAVENOUS
  Filled 2017-05-22 (×2): qty 5

## 2017-05-22 MED ORDER — ONDANSETRON HCL 40 MG/20ML IJ SOLN
8.0000 mg | Freq: Four times a day (QID) | INTRAMUSCULAR | Status: DC | PRN
  Filled 2017-05-22: qty 4

## 2017-05-22 MED ORDER — ONDANSETRON HCL 4 MG/2ML IJ SOLN: 4.0000 mg | Freq: Four times a day (QID) | INTRAMUSCULAR | Status: DC | PRN

## 2017-05-22 MED ORDER — ATORVASTATIN CALCIUM 40 MG PO TABS: 40.0000 mg | ORAL_TABLET | Freq: Every day | ORAL | Status: DC

## 2017-05-22 MED ORDER — METOPROLOL TARTRATE 5 MG/5ML IV SOLN
5.0000 mg | Freq: Three times a day (TID) | INTRAVENOUS | Status: DC
  Administered 2017-05-22 – 2017-05-25 (×10): 5 mg via INTRAVENOUS
  Filled 2017-05-22 (×11): qty 5

## 2017-05-22 MED ORDER — SUCCINYLCHOLINE CHLORIDE 200 MG/10ML IV SOSY
PREFILLED_SYRINGE | INTRAVENOUS | Status: DC | PRN
  Administered 2017-05-22: 120 mg via INTRAVENOUS

## 2017-05-22 MED ORDER — ADULT MULTIVITAMIN W/MINERALS CH: 1.0000 | ORAL_TABLET | Freq: Every day | ORAL | Status: DC

## 2017-05-22 MED ORDER — BUPIVACAINE LIPOSOME 1.3 % IJ SUSP
20.0000 mL | INTRAMUSCULAR | Status: AC
  Filled 2017-05-22: qty 20

## 2017-05-22 MED ORDER — LACTATED RINGERS IV BOLUS
1000.0000 mL | Freq: Three times a day (TID) | INTRAVENOUS | Status: AC | PRN
Start: 2017-05-22 — End: 2017-05-24

## 2017-05-22 MED ORDER — SUGAMMADEX SODIUM 200 MG/2ML IV SOLN
INTRAVENOUS | Status: AC
  Filled 2017-05-22: qty 2

## 2017-05-22 MED ORDER — RISPERIDONE 1 MG PO TABS: 1.0000 mg | ORAL_TABLET | Freq: Every day | ORAL | Status: DC

## 2017-05-22 MED ORDER — BUPIVACAINE-EPINEPHRINE 0.5% -1:200000 IJ SOLN
INTRAMUSCULAR | Status: AC
  Filled 2017-05-22: qty 1

## 2017-05-22 MED ORDER — PROPOFOL 10 MG/ML IV BOLUS
INTRAVENOUS | Status: AC
  Filled 2017-05-22: qty 20

## 2017-05-22 MED ORDER — HYDROCORTISONE 1 % EX CREA: 1.0000 "application " | TOPICAL_CREAM | Freq: Three times a day (TID) | CUTANEOUS | Status: DC | PRN

## 2017-05-22 SURGICAL SUPPLY — 49 items
BLADE EXTENDED COATED 6.5IN (ELECTRODE) IMPLANT
BLADE HEX COATED 2.75 (ELECTRODE) IMPLANT
CATH KIT ON-Q SILVERSOAK 7.5IN (CATHETERS) IMPLANT
CHLORAPREP W/TINT 26ML (MISCELLANEOUS) ×3 IMPLANT
COUNTER NEEDLE 20 DBL MAG RED (NEEDLE) IMPLANT
COVER MAYO STAND STRL (DRAPES) IMPLANT
COVER SURGICAL LIGHT HANDLE (MISCELLANEOUS) ×3 IMPLANT
DRAIN CHANNEL 19F RND (DRAIN) IMPLANT
DRAPE LAPAROSCOPIC ABDOMINAL (DRAPES) IMPLANT
DRAPE SHEET LG 3/4 BI-LAMINATE (DRAPES) IMPLANT
DRAPE UTILITY XL STRL (DRAPES) IMPLANT
DRAPE WARM FLUID 44X44 (DRAPE) ×3 IMPLANT
DRSG OPSITE POSTOP 4X10 (GAUZE/BANDAGES/DRESSINGS) IMPLANT
DRSG OPSITE POSTOP 4X6 (GAUZE/BANDAGES/DRESSINGS) IMPLANT
DRSG OPSITE POSTOP 4X8 (GAUZE/BANDAGES/DRESSINGS) ×3 IMPLANT
ELECT PENCIL ROCKER SW 15FT (MISCELLANEOUS) ×3 IMPLANT
ELECT REM PT RETURN 15FT ADLT (MISCELLANEOUS) ×3 IMPLANT
GAUZE SPONGE 4X4 12PLY STRL (GAUZE/BANDAGES/DRESSINGS) IMPLANT
GLOVE ECLIPSE 8.0 STRL XLNG CF (GLOVE) ×6 IMPLANT
GLOVE INDICATOR 8.0 STRL GRN (GLOVE) ×6 IMPLANT
GOWN STRL REUS W/TWL XL LVL3 (GOWN DISPOSABLE) ×6 IMPLANT
HANDLE SUCTION POOLE (INSTRUMENTS) ×1 IMPLANT
KIT BASIN OR (CUSTOM PROCEDURE TRAY) ×3 IMPLANT
LEGGING LITHOTOMY PAIR STRL (DRAPES) IMPLANT
NEEDLE HYPO 22GX1.5 SAFETY (NEEDLE) ×3 IMPLANT
PACK GENERAL/GYN (CUSTOM PROCEDURE TRAY) ×3 IMPLANT
PAD POSITIONING PINK XL (MISCELLANEOUS) IMPLANT
STAPLER GUN LINEAR PROX 60 (STAPLE) ×3 IMPLANT
STAPLER PROXIMATE 75MM BLUE (STAPLE) ×3 IMPLANT
STAPLER VISISTAT 35W (STAPLE) ×3 IMPLANT
SUCTION POOLE HANDLE (INSTRUMENTS) ×3
SUT MNCRL AB 4-0 PS2 18 (SUTURE) ×3 IMPLANT
SUT PDS AB 1 CTX 36 (SUTURE) ×6 IMPLANT
SUT PDS AB 1 TP1 96 (SUTURE) IMPLANT
SUT PROLENE 2 0 SH DA (SUTURE) IMPLANT
SUT SILK 2 0 (SUTURE) ×2
SUT SILK 2 0 SH CR/8 (SUTURE) ×3 IMPLANT
SUT SILK 2-0 18XBRD TIE 12 (SUTURE) ×1 IMPLANT
SUT SILK 3 0 (SUTURE)
SUT SILK 3 0 SH CR/8 (SUTURE) ×3 IMPLANT
SUT SILK 3-0 18XBRD TIE 12 (SUTURE) IMPLANT
SUT VIC AB 2-0 SH 18 (SUTURE) ×3 IMPLANT
SUT VICRYL 0 UR6 27IN ABS (SUTURE) IMPLANT
SYR 30ML LL (SYRINGE) ×3 IMPLANT
TAPE UMBILICAL COTTON 1/8X30 (MISCELLANEOUS) IMPLANT
TOWEL OR 17X26 10 PK STRL BLUE (TOWEL DISPOSABLE) ×6 IMPLANT
TOWEL OR NON WOVEN STRL DISP B (DISPOSABLE) ×6 IMPLANT
TRAY FOLEY MTR SLVR 16FR STAT (SET/KITS/TRAYS/PACK) ×3 IMPLANT
YANKAUER SUCT BULB TIP 10FT TU (MISCELLANEOUS) ×3 IMPLANT

## 2017-05-22 NOTE — Progress Notes (Addendum)
S: Patient appears comfortable. When asked she denies pain. Patient does have dementia, so pain difficult to assess.  O:  Physical exam Gen - NAD, awake, pleasant CV - RRR Lung - normal effort, CTAB GI - abdomen soft, ND, mild guarding with palpation, dressing c/d/i, NGT present  GU - foley present   A/P: HTN HLD Hypothyroidism IBS/Crohn's Disease Hx of bipolar affective disorder with hx of psychosis Dementia RLS UTI AKI  Incarcerated ventral hernia S/p exploratory laparotomy, partial omentectomy, primary repair of ventral hernia, small bowel resection x1 - 05/22/17 Dr. Johney Maine - continue NGT on LIWS - changed some PO medications to IV (levothyroxine, anti-HTN) - PRN 12.5 mg fentanyl for pain if needed, may also try rectal tylenol first - hold other PO medications for now, may resume when able to tolerate PO in a few days - check labs later today and tomorrow AM  FEN: NPO, IVF; NGT to LIWS VTE: SCDs ID: cefotetan periop, azithromycin 5/16, rocephin 5/15>>   Brigid Re , [facility] 05/22/2017, 7:33 AM Pager: [phone] Consults: [phone]  Agree with above.   In ICU.  She is confused and disoriented.  Has NGT.  PO meds won't work for a while. Abdomen okay.  Alphonsa Overall, MD, [facility] Pager: [phone] Office phone:  [phone]

## 2017-05-22 NOTE — ED Notes (Signed)
ED Provider at bedside. 

## 2017-05-22 NOTE — Anesthesia Postprocedure Evaluation (Signed)
Anesthesia Post Note  Patient: [name]  Procedure(s) Performed: EXPLORATORY LAPAROTOMY reduction repair incarcerated ventral wall hernia small bowel reseection (N/A Abdomen)     Patient location during evaluation: PACU Anesthesia Type: General Level of consciousness: sedated and patient cooperative Pain management: pain level controlled Vital Signs Assessment: post-procedure vital signs reviewed and stable Respiratory status: spontaneous breathing Cardiovascular status: stable Anesthetic complications: no    Last Vitals:  Vitals:   05/22/17 1600 05/22/17 1953  BP:    Pulse:    Resp:    Temp: 36.9 C 36.6 C  SpO2:      Last Pain:  Vitals:   05/22/17 1953  TempSrc: Oral  PainSc:                  Nolon Nations

## 2017-05-22 NOTE — ED Notes (Signed)
Attempted to insert NGT twice ,unsuccessful, met resistance , Dr. [name] in the room . Pt became tachycardic, HR at 140's -150's bpm., pt. Is cooperative. Dr. [name] verbalized to "HOLD off " NGT insertion at this time, awaiting for Surgeon.

## 2017-05-22 NOTE — Anesthesia Procedure Notes (Signed)
Procedure Name: Intubation Date/Time: 05/22/2017 4:46 AM Performed by: Zulay Corrie D, CRNA Pre-anesthesia Checklist: Patient identified, Emergency Drugs available, Suction available and Patient being monitored Patient Re-evaluated:Patient Re-evaluated prior to induction Oxygen Delivery Method: Circle system utilized Preoxygenation: Pre-oxygenation with 100% oxygen Induction Type: IV induction, Rapid sequence and Cricoid Pressure applied Laryngoscope Size: Mac and 4 Tube type: Oral Tube size: 7.5 mm Number of attempts: 1 Airway Equipment and Method: Stylet Placement Confirmation: ETT inserted through vocal cords under direct vision,  positive ETCO2 and breath sounds checked- equal and bilateral Secured at: 20 cm Tube secured with: Tape Dental Injury: Teeth and Oropharynx as per pre-operative assessment

## 2017-05-22 NOTE — Op Note (Signed)
05/22/2017  6:02 AM  PATIENT:  [name]  84 y.o. female  Patient Care Team: [name], MD as PCP - General (Family Medicine) [name], MD as Consulting Physician (Hematology) [name], MD as Consulting Physician (General Surgery) [name], [name], NP as Nurse Practitioner (Hematology and Oncology) Jessy Oto, MD as Consulting Physician (Orthopedic Surgery) Minus Breeding, MD as Consulting Physician (Cardiology)  PRE-OPERATIVE DIAGNOSIS:  Incarcerated umbilical hernia  POST-OPERATIVE DIAGNOSIS:  Incarcerated ventral wall hernia causing small bowel resection  PROCEDURE:    EXPLORATORY LAPAROTOMY Partial omentectomy Reduction & primary repair incarcerated ventral wall hernia Small bowel resection  SURGEON:  [name], MD  ASSISTANT: OR Staff   ANESTHESIA:   local and general  EBL:  Total I/O In: 7654 [I.V.:2350; IV Piggyback:1350] Out: 450 [Urine:25; Emesis/NG output:400; Blood:25].  See anesthesia record  Delay start of Pharmacological VTE agent (>24hrs) due to surgical blood loss or risk of bleeding:  no  DRAINS: none   SPECIMEN:  Source of Specimen:  Necrotic ometum & ischemic small intestine, hernia sac  DISPOSITION OF SPECIMEN:  PATHOLOGY  COUNTS:  YES  PLAN OF CARE: Admit to inpatient   PATIENT DISPOSITION:  PACU - hemodynamically stable.  INDICATION: Elderly demented woman lives at assisted living with a caregiver and a outside guardianship.  Worsening nausea vomiting and abdominal pain for the past 24 hours.  UTI suspected but persistent pain.  Exam concerning for periumbilical mass.  CT scan confirms small bowel obstruction with transition point within a loop of small intestine incarcerated and a ventral hernia.  Not able to be reduced.  Showing discoloration with severe pain.  I was concerned of strangulation.  I recommended emergent operative exploration with possible bowel resection and hernia repair.  Patient gave verbal  consent.  She is demented however.  However, there is no one else to reach.  Husband is even more demented.  Guardianship service not able to be reached.  Voicemail was made.  Repeated calls.  I made a decision to proceed with surgery since I was worried about gangrene and other worsening symptoms.  Feculent drainage from the NGTube.  Tachycardia.  Dr. Jennette Kettle with [facility] hospitalist as well as Dr. [name] with the [facility] emergency department agreed.  The anatomy & physiology of the digestive tract was discussed.  The pathophysiology of perforation was discussed.  Differential diagnosis such as perforated ulcer or colon, etc was discussed.   Natural history risks without surgery such as death was discussed.  I recommended abdominal exploration to diagnose & treat the source of the problem.  Laparoscopic & open techniques were discussed.   Risks such as bleeding, infection, abscess, leak, reoperation, bowel resection, possible ostomy, injury to other organs, need for repair of tissues / organs, hernia, heart attack, death, and other risks were discussed.   The risks of no intervention will lead to serious problems including death.   I expressed a good likelihood that surgery will address the problem.    Goals of post-operative recovery were discussed as well.  We will work to minimize complications although risks in an emergent setting are high.   Questions were answered.  The patient expressed understanding & wishes to proceed with surgery.      OR FINDINGS: 4 cm periumbilical fascial defect with hernia sac incarcerated with necrotic omentum and ischemic small intestine.  Tight stricturing in 2 areas.  Do not feel salvageable.  Therefore small bowel resection made x15 cm.  Anastomosis  made.  Hernia defect repaired transversely primarily.  DESCRIPTION: Informed consent was confirmed.   The patient received IV antibiotics and underwent general anesthesia without any difficulty. The patient was positioned  appropriately. Foley catheter had been sterilely placed. SCDs were active during the entire case.  The abdomen was prepped and draped in a sterile fashion.  Surgical timeout confirmed our plan.  Entry was gained through a midline incision around the periumbilical mass.  The umbilicus was quite stretched out and obliterated.  Was densely adherent to the hernia sac it cannot be salvaged.  Therefore umbilicus was removed.  I was able to come around the hernia sac around the subcutaneous tissues to the fascia.  I carefully opened up the hernia sac with Metzenbaum scissors and encountered necrotic omentum.  Opened up the sac more.  Resected omentum that was frankly necrotic.  Did this was clamps and silk ties.  Reduce the omentum in.  Small bowel very red and inflamed.  Not quite gangrenous yet.  I opened up the hernia fascial defect with a centimeter cephalad and inferiorly.  Not release of tension and allowed me to eviscerate small intestine.  It was obvious there is a transition point with this incarcerated loop of small intestine.  Tight stricturing that looked chronic.  I do not feel like it could be salvaged.  I therefore resected.  There is a 75 GIA side to side stapled anastomosis.  Transected the common staple defect with a TX 60 stapler.  Transected the mesentery with silk clamps and tied.  Closed the mesenteric defect with interrupted silk suture transversely, covering up the [place] staple line.  Placed a 2-0 silk suture at the crotch on the other end of the staple line.  Anastomosis was patent and viable.  Tissues were thinned out and weak but no serosal injury other abnormality.  I ran the small bowel proximally distally.  No other abnormalities.  No other adhesions to the anterior abdominal wall.  I allowed the anastomosis about a return to the abdomen.  Not much omentum left to help cover.  I did copious irrigation of the peritoneum and subcutaneous wound.  Because the umbilicus had to be removed and it  was rather broad circular defect, I decided to close it transversely since it came together better with less tension.  Redundant and ischemic hernia sac excised.  Mobilized subcutaneous tissues off the fascia to better expose the central abdomen fascia.  Fascia hernia defect approximated using #1 running PDS from each corner to the center.  I also did some #1 PDS retention sutures as well interrupted x2.  That closed the hernia defect primarily transversely.  Not feel it was a good idea to use mesh in the situation given the necrotic omentum and severely ischemic bowel.  We did copious irrigation again in the obese subcutaneous wound..  I excised the redundant skin and closed the wound transversely using 2-0 Vicryl interrupted suture in the deep dermal region.  Again seem to come together better transversely.  Excised some lateral skin "dog-ears."  I closed the skin with 4-0 Monocryl centrally, sparing the outer corners.  Placed antibiotic soaked umbilical tape weeks of the corners.  Sterile dressing applied.  Patient did have some tachycardia but was not hypotensive.  Plan by the primary hospitalist service is to have the stepdown unit to monitor.  There is no family available to reach.  We will try attempt later in the day.   [name], M.D., F.A.C.S. Gastrointestinal and  Minimally Invasive Surgery [facility] [address], [place], [place] [address] [phone] Main / Paging

## 2017-05-22 NOTE — H&P (Signed)
History and Physical    [name] KGU:[id number] DOB: 30-Jun-1932 DOA: 05/21/2017  PCP: [name], MD  Patient coming from: SNF  I have personally briefly reviewed patient's old medical records in [place]  Chief Complaint: Abd pain  HPI: [name] is a 84 y.o. female with medical history significant of Advanced dementia, CVD.  Patient presents to the ED with c/o abd pain, N/V.  Symptoms ongoing for the past 24 hours.  Cannot keep anything down PO.  Guardian service was concerned and told caregiver to bring patient to ED.  Patient endorses abdominal pain, especially with palpation off abdomen and her incarcerated umbilical hernia.   ED Course: Found to have UTI, but more significantly found to have high grade SBO secondary to incarcerated umbilical hernia.  Dr. Johney Maine was unable to reduce the hernia manually.  Extensive attempts have been made to get in touch with guardian to update them and ask about surgery without success.  Review of Systems: Unable to perform due to dementia.  Past Medical History:  Diagnosis Date  . Bipolar affective ([place])    History of psychosis  . C. difficile diarrhea   . CAD (coronary artery disease)    LAD 30% 2006  . Cerebrovascular disease   . Colonic polyp   . Crohn's disease ([place])   . Diverticulosis of colon   . Fibromyalgia   . GERD (gastroesophageal reflux disease)   . History of atrial flutter   . History of bronchitis   . History of headache   . History of hyperparathyroidism   . History of tuberculosis   . Hyperlipemia   . Hypertension   . Hypothyroidism   . IBS (irritable bowel syndrome)   . Memory disturbance   . 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 Obesity   . OSA (obstructive sleep apnea)    denies 12/07/15  . Osteopenia   . Other diseases of lung, not elsewhere classified   . Psychosis ([place])   . Radiation 05/2008   5000 cGy to left lower eyelid, parotid lymphatics and upper neck  . Restless legs  syndrome ([facility])   . Syncope   . Tuberculosis     Past Surgical History:  Procedure Laterality Date  . APPENDECTOMY    . BLADDER SUSPENSION    . BREAST LUMPECTOMY WITH RADIOACTIVE SEED LOCALIZATION Left 12/08/2015   Procedure: BREAST LUMPECTOMY WITH RADIOACTIVE SEED LOCALIZATION;  Surgeon: Autumn Messing III, MD;  Location: [facility];  Service: General;  Laterality: Left;  . CATARACT EXTRACTION Bilateral   . CHOLECYSTECTOMY    . eye lid surgery  04/04/2011   [facility] by Dr. Norlene Duel  . HEEL SPUR EXCISION    . INCISION AND DRAINAGE PERIRECTAL ABSCESS    . LUMBAR LAMINECTOMY    . Moh';s surg left lower eye lid surgery and lid reconstruction surgery  2/10  . NASAL SINUS SURGERY Right    Right maxillary  . Parathyroid adenoma surgery    . POSTERIOR CERVICAL FUSION/FORAMINOTOMY N/A 10/12/2013   Procedure: Cervical five-Thoracic two cervico-thoracic posterior fusion;  Surgeon: [name], MD;  Location: [facility];  Service: Neurosurgery;  Laterality: N/A;  . TONSILLECTOMY AND ADENOIDECTOMY       reports that she quit smoking about 57 years ago. Her smoking use included cigarettes. She quit after 6.00 years of use. She has never used smokeless tobacco. She reports that she does not drink alcohol or use drugs.  Allergies  Allergen Reactions  . Prednisone Other (See Comments)    Patient had psychiatric episode with hallucinations. Caused 3.5 week long hospitalization & loss of memory   . Carbamazepine Nausea Only and Other (See Comments)    Tegretol; dizziness, blurred vision, nausea, headache, insomnia  . Codeine Nausea And Vomiting  . Demerol [Meperidine] Nausea And Vomiting  . Diclofenac Sodium Other (See Comments)    severe headache  . Fentanyl Nausea And Vomiting and Other (See Comments)    IV Fentanyl; caused nausea and vomiting Fentanyl patch; caused chest pain, HBP, with second patch experienced vertigo and nausea  . Fluoxetine Hcl Nausea Only and Other (See Comments)    dizziness,  blurred vision, nausea, headache, insomnia  . Gentamicin     Other reaction(s): EYE IRRITATION  . Hydromorphone Hcl Nausea And Vomiting  . Ibuprofen Other (See Comments)    Caused ulcers and colitis  . Indomethacin Nausea Only and Other (See Comments)    dizziness, blurred vision, nausea, headache, insomnia  . Lisinopril Cough  . Meperidine Hcl Nausea And Vomiting  . Morphine Nausea And Vomiting  . Pregabalin Other (See Comments)    severe restless legs, insomnia  . Propranolol Hcl Nausea Only and Other (See Comments)    dizziness, blurred vision, nausea, headache, insomnia  . Refresh Lacri-Lube [Eye Lubricant] Swelling    Caused swelling, redness, hard crust on eyelids   . Ropinirole Hcl Other (See Comments)    severe restless legs and insomnia  . Sulfamethoxazole Nausea Only and Other (See Comments)    gas, loose stools  . White Petrolatum-Mineral Oil  [Aquaphilic]     Other reaction(s): SWELLING/EDEMA  . Bacitracin Hives, Itching and Rash    redness    Family History  Problem Relation Age of Onset  . Heart failure Father   . Arthritis Mother   . Hypertension Mother   . Ulcers Mother   . Colon cancer Maternal Grandmother   . Pulmonary embolism Unknown      Prior to Admission medications   Medication Sig Start Date End Date Taking? Authorizing Provider  amLODipine (NORVASC) 5 MG tablet TAKE 1 TABLET (5 MG TOTAL) BY MOUTH DAILY. 02/19/16  Yes Burchette, Alinda Sierras, MD  anastrozole (ARIMIDEX) 1 MG tablet Take 1 tablet (1 mg total) by mouth daily. 07/22/16  Yes [name], MD  aspirin 81 MG EC tablet Take 81 mg by mouth. 04/05/08  Yes [provider]  atorvastatin (LIPITOR) 40 MG tablet TAKE 1 TABLET DAILY 03/04/16  Yes Burchette, Alinda Sierras, MD  buPROPion (WELLBUTRIN XL) 150 MG 24 hr tablet Take 150 mg by mouth daily.   Yes [provider]  dicyclomine (BENTYL) 10 MG capsule Take 1 capsule (10 mg total) by mouth 3 (three) times daily before meals. Patient taking  differently: Take 10 mg by mouth 2 (two) times daily.  03/12/15  Yes Harris, Abigail, PA-C  donepezil (ARICEPT) 5 MG tablet Take 5 mg by mouth at bedtime.   Yes [provider]  levothyroxine (SYNTHROID, LEVOTHROID) 88 MCG tablet Take 1 tablet (88 mcg total) by mouth daily before breakfast. Pt needs an appointment for further refills 01/09/16  Yes Burchette, Alinda Sierras, MD  memantine (NAMENDA) 10 MG tablet TAKE 1 TABLET (10 MG TOTAL) BY MOUTH TWO  TIMES DAILY. 09/04/16  Yes Burchette, Alinda Sierras, MD  Multiple Vitamin (MULTIVITAMIN WITH MINERALS) TABS tablet Take 1 tablet by mouth daily.   Yes [provider]  Propylene Glycol (SYSTANE BALANCE) 0.6 % SOLN  Place 1 drop into both eyes daily.   Yes [provider]  risperiDONE (RISPERDAL) 1 MG tablet Take 0.5 tablets (0.5 mg total) at bedtime by mouth. 11/20/16  Yes Burchette, Alinda Sierras, MD  cephALEXin (KEFLEX) 500 MG capsule Take 1 capsule (500 mg total) by mouth 4 (four) times daily. 05/21/17   Nat Christen, MD    Physical Exam: Vitals:   05/21/17 2345 05/22/17 0000 05/22/17 0100 05/22/17 0130  BP:  (!) 148/73 (!) 148/60 131/69  Pulse: 93 99 96 (!) 104  Resp: 17 (!) 22 19 (!) 22  Temp:      TempSrc:      SpO2: 92% 95% 90% 93%    Constitutional: having abd pain, uncomfortable Eyes: PERRL, lids and conjunctivae normal ENMT: Mucous membranes are moist. Posterior pharynx clear of any exudate or lesions.Normal dentition.  Neck: normal, supple, no masses, no thyromegaly Respiratory: clear to auscultation bilaterally, no wheezing, no crackles. Normal respiratory effort. No accessory muscle use.  Cardiovascular: Regular rate and rhythm, no murmurs / rubs / gallops. No extremity edema. 2+ pedal pulses. No carotid bruits.  Abdomen: distended, TTP, non-reducible umbilical hernia, very painful and turning purple in color Musculoskeletal: no clubbing / cyanosis. No joint deformity upper and lower extremities. Good ROM, no contractures.  Normal muscle tone.  Skin: no rashes, lesions, ulcers. No induration Neurologic: CN 2-12 grossly intact. Sensation intact, DTR normal. Strength 5/5 in all 4.  Psychiatric: Flat affect   Labs on Admission: I have personally reviewed following labs and imaging studies  CBC: Recent Labs  Lab 05/21/17 2058  WBC 18.6*  NEUTROABS 15.3*  HGB 10.9*  HCT 36.4  MCV 79.3  PLT 024*   Basic Metabolic Panel: Recent Labs  Lab 05/21/17 2058  NA 143  K 4.7  CL 104  CO2 21*  GLUCOSE 164*  BUN 30*  CREATININE 1.66*  CALCIUM 9.6   GFR: CrCl cannot be calculated (Unknown ideal weight.). Liver Function Tests: Recent Labs  Lab 05/21/17 2058  AST 26  ALT 20  ALKPHOS 113  BILITOT 0.7  PROT 7.7  ALBUMIN 3.8   Recent Labs  Lab 05/21/17 2058  LIPASE 32   No results for input(s): AMMONIA in the last 168 hours. Coagulation Profile: No results for input(s): INR, PROTIME in the last 168 hours. Cardiac Enzymes: No results for input(s): CKTOTAL, CKMB, CKMBINDEX, TROPONINI in the last 168 hours. BNP (last 3 results) No results for input(s): PROBNP in the last 8760 hours. HbA1C: No results for input(s): HGBA1C in the last 72 hours. CBG: No results for input(s): GLUCAP in the last 168 hours. Lipid Profile: No results for input(s): CHOL, HDL, LDLCALC, TRIG, CHOLHDL, LDLDIRECT in the last 72 hours. Thyroid Function Tests: No results for input(s): TSH, T4TOTAL, FREET4, T3FREE, THYROIDAB in the last 72 hours. Anemia Panel: No results for input(s): VITAMINB12, FOLATE, FERRITIN, TIBC, IRON, RETICCTPCT in the last 72 hours. Urine analysis:    Component Value Date/Time   COLORURINE AMBER (A) 05/21/2017 2253   APPEARANCEUR CLOUDY (A) 05/21/2017 2253   LABSPEC 1.024 05/21/2017 2253   PHURINE 5.0 05/21/2017 2253   GLUCOSEU NEGATIVE 05/21/2017 2253   GLUCOSEU NEGATIVE 07/22/2008 1105   HGBUR LARGE (A) 05/21/2017 2253   BILIRUBINUR NEGATIVE 05/21/2017 2253   KETONESUR 5 (A) 05/21/2017  2253   PROTEINUR 100 (A) 05/21/2017 2253   UROBILINOGEN 0.2 11/19/2014 1430   NITRITE NEGATIVE 05/21/2017 2253   LEUKOCYTESUR LARGE (A) 05/21/2017 2253    Radiological Exams on Admission:  Ct Abdomen Pelvis Wo Contrast  Result Date: 05/22/2017 CLINICAL DATA:  Acute onset of nausea and vomiting. Generalized abdominal distention. Leukocytosis. EXAM: CT ABDOMEN AND PELVIS WITHOUT CONTRAST TECHNIQUE: Multidetector CT imaging of the abdomen and pelvis was performed following the standard protocol without IV contrast. COMPARISON:  CT of the abdomen and pelvis from 03/12/2015 FINDINGS: Lower chest: Patchy bibasilar airspace opacities may reflect atelectasis or possibly mild infection. The visualized portions of the mediastinum are grossly unremarkable. Hepatobiliary: The liver is unremarkable in appearance. The patient is status post cholecystectomy, with clips noted at the gallbladder fossa. The common bile duct remains normal in caliber. Pancreas: The pancreas is within normal limits. Spleen: The spleen is unremarkable in appearance. Adrenals/Urinary Tract: The adrenal glands are grossly unremarkable in appearance. Bilateral renal stones measure up to 1.8 cm in size. There is a large 1.5 x 1.0 cm stone at the right renal pelvis, just above the right ureteropelvic junction. No significant hydronephrosis is seen. Bilateral renal parapelvic cysts are noted. No perinephric stranding is appreciated. Stomach/Bowel: There is dilatation of small-bowel loops to 3.7 cm in maximal diameter, with trace associated free fluid and mild mesenteric edema. There appears to be a transition point at the level of a moderate periumbilical hernia, which contains a loop of mid ileum. There is no definite evidence of strangulation. The colon is largely decompressed. Scattered diverticulosis is noted along the sigmoid colon, without evidence of diverticulitis. The stomach is largely filled with fluid and air, and is unremarkable in  appearance. Vascular/Lymphatic: Diffuse calcification is seen along the abdominal aorta and its branches. The abdominal aorta is otherwise grossly unremarkable. The inferior vena cava is grossly unremarkable. No retroperitoneal lymphadenopathy is seen. No pelvic sidewall lymphadenopathy is identified. Reproductive: The bladder is mildly distended and grossly unremarkable. The uterus is unremarkable in appearance. No suspicious adnexal masses are seen. The ovaries are grossly symmetric. Other: No additional soft tissue abnormalities are seen. Musculoskeletal: No acute osseous abnormalities are identified. The patient is status post decompression at the lower lumbar spine. The visualized musculature is unremarkable in appearance. IMPRESSION: 1. Small-bowel obstruction due to herniation of the mid ileum at a moderate periumbilical hernia, with dilatation of small-bowel loops to 3.7 cm in maximal diameter, trace free fluid and mild mesenteric edema. No definite evidence of strangulation at this time. 2. Patchy bibasilar airspace opacities may reflect atelectasis or possibly mild infection. 3. Bilateral renal stones measure up to 1.8 cm in size. Large 1.5 x 1.0 cm stone at the right renal pelvis, just above the right ureteropelvic junction. No significant hydronephrosis seen. 4. Bilateral renal parapelvic cysts noted. 5. Scattered diverticulosis along the sigmoid colon, without evidence of diverticulitis. Aortic Atherosclerosis (ICD10-I70.0). Electronically Signed   By: Garald Balding M.D.   On: 05/22/2017 00:57    EKG: Independently reviewed.  Assessment/Plan Principal Problem:   Ventral hernia with bowel obstruction Active Problems:   Essential hypertension   Dementia with behavioral disturbance   AKI (acute kidney injury) ([place])   Acute lower UTI   Incarcerated ventral hernia    1. Strangulated ventral hernia with SBO - 1. NGT 2. NPO except sips with meds 3. Dr. Johney Maine unable to reduce  manually 4. Unable to get in contact with POA / guardian at this point.  EDP left voicemail. 1. Additionally, myself and Dr. Johney Maine have tried calling, Dr. Johney Maine also left voice mail. 2. Guardianship office will be open at 8am but patient needs emergency surgery now! 3. Patient herself is trying  to give consent and says "yes" to the surgery, but of course she doesn't have capacity (and has guardian). 4. After discussion with Dr. Johney Maine, myself, and Dr. [name] as well as caregiver and caregiver supervisor.  We all believe that this emergency surgery to save patients life falls under implied consent for this full code patient and therefore Dr. Johney Maine will proceed with surgery. 5. Fentanyl for pain. 2. AKI - 1. Likely pre-renal due to #1 2. IVF: NS at 125 cc/hr 3. Repeat BMP in AM 3. UTI - 1. Rocephin 2. UCx pending 4. HTN - Cont home BP meds when able to take POs 5. Dementia - continue home meds when able to take POs  DVT prophylaxis: SCDs Code Status: Full code - currently full code at SNF, no DNR form at bedside Family Communication: Unable to get in contact with POA / guardian at this point.  EDP left voicemail. Disposition Plan: SNF after admit Consults called: Dr. Johney Maine Admission status: Admit to inpatient - IP status for SBO with NGT, emergency surgery.   [name] DO [facility] Pager [phone]  If 7AM-7PM, please contact day team taking care of patient [email or web address] Password [email or web address]  05/22/2017, 2:11 AM

## 2017-05-22 NOTE — Progress Notes (Signed)
PROGRESS NOTE  [name]:[id number] DOB: [birth year] DOA: 05/21/2017 PCP: [name], MD  HPI/Recap of past 24 hours: HPI from Dr [name] on 05/22/17 [name] is a 84 y.o. female with medical history significant of Advanced dementia, HTN, Hypothyroidism, HLD, bipolar, presents to the ED with c/o abd pain, N/V.  Symptoms ongoing for the past 24 hours.  Cannot keep anything down by mouth. Guardian service was concerned and told caregiver to bring patient to ED. In the ED, found to have UTI, but more significantly found to have high grade SBO secondary to incarcerated umbilical hernia.  Gen surg was consulted, Dr. Johney Maine who performed emergent surgery on 05/22/17.  Today, pt denies any new complaints (unsure if reliable, has dementia), not in any distress.   Assessment/Plan: Principal Problem:   Incarcerated ventral hernia s/p SB resection & primary hernia repair 05/22/2017 Active Problems:   OBESITY   OBSTRUCTIVE SLEEP APNEA   Essential hypertension   GASTROESOPHAGEAL REFLUX DISEASE   Memory impairment   Dementia with behavioral disturbance   AKI (acute kidney injury) ([place])   Acute lower UTI   SBO (small bowel obstruction) s/p SB resection 05/22/2017   Ventral hernia with bowel obstruction   Bipolar affective ([place])  Incarcerated ventral hernia Status post ex lap, partial omentectomy, primary repair of ventral hernia, small bowel resection x1 on 05/22/2017 by Dr. Johney Maine Hemodynamically stable Currently n.p.o., continue IV fluids NGT to LIWS Management primarily by surgery  AKI Likely prerenal due to above Continue IV fluids Daily BMP  UTI UA with large leukocytes, few bacteria,> 50 WBC clumps UC pending Continue IV Rocephin  Normocytic anemia Baseline around 10 Daily CBC  Hypertension Uncontrolled Held home p.o. meds Continue IV Lopressor scheduled and as needed  Hypothyroidism Continue IV Synthroid  Bipolar Held home risperidone, bupropion May  restart this soon as possible to avoid withdrawal  Dementia Held home donepezil, Namenda     Code Status: Full  Family Communication: None at bedside  Disposition Plan: Back to assisted living facility   Consultants:  General surgery  Procedures:  Ex lap on 05/22/2017  Antimicrobials:  IV Rocephin  DVT prophylaxis: SCDs   Objective: Vitals:   05/22/17 0701 05/22/17 0800 05/22/17 1215 05/22/17 1230  BP:  (!) 189/81  (!) 163/36  Pulse:      Resp:  19  13  Temp: 97.7 F (36.5 C) 98.2 F (36.8 C) 98.8 F (37.1 C)   TempSrc: Axillary Axillary Axillary   SpO2:  99%  99%  Weight: 83 kg (182 lb 15.7 oz)       Intake/Output Summary (Last 24 hours) at 05/22/2017 1333 Last data filed at 05/22/2017 1230 Gross per 24 hour  Intake 4700 ml  Output 1010 ml  Net 3690 ml   Filed Weights   05/22/17 0701  Weight: 83 kg (182 lb 15.7 oz)    Exam:   General: NAD, alert, awake, oriented to place and self  Cardiovascular: S1, S2 present  Respiratory: CTAB  Abdomen: Soft, tenderness around surgical site, nondistended, hypoactive bowel sounds, NG tube in place  Musculoskeletal: No pedal edema bilaterally  Skin: Normal  Psychiatry: Unable to assess   Data Reviewed: CBC: Recent Labs  Lab 05/21/17 2058 05/22/17 0725  WBC 18.6* 8.2  NEUTROABS 15.3*  --   HGB 10.9* 9.4*  HCT 36.4 32.2*  MCV 79.3 79.7  PLT 425* 563   Basic Metabolic Panel: Recent Labs  Lab 05/21/17 2058 05/22/17 0725  NA  143 142  K 4.7 4.7  CL 104 104  CO2 21* 24  GLUCOSE 164* 156*  BUN 30* 35*  CREATININE 1.66* 1.82*  CALCIUM 9.6 8.6*   GFR: Estimated Creatinine Clearance: 24.8 mL/min (A) (by C-G formula based on SCr of 1.82 mg/dL (H)). Liver Function Tests: Recent Labs  Lab 05/21/17 2058  AST 26  ALT 20  ALKPHOS 113  BILITOT 0.7  PROT 7.7  ALBUMIN 3.8   Recent Labs  Lab 05/21/17 2058  LIPASE 32   No results for input(s): AMMONIA in the last 168 hours. Coagulation  Profile: No results for input(s): INR, PROTIME in the last 168 hours. Cardiac Enzymes: No results for input(s): CKTOTAL, CKMB, CKMBINDEX, TROPONINI in the last 168 hours. BNP (last 3 results) No results for input(s): PROBNP in the last 8760 hours. HbA1C: No results for input(s): HGBA1C in the last 72 hours. CBG: No results for input(s): GLUCAP in the last 168 hours. Lipid Profile: No results for input(s): CHOL, HDL, LDLCALC, TRIG, CHOLHDL, LDLDIRECT in the last 72 hours. Thyroid Function Tests: No results for input(s): TSH, T4TOTAL, FREET4, T3FREE, THYROIDAB in the last 72 hours. Anemia Panel: No results for input(s): VITAMINB12, FOLATE, FERRITIN, TIBC, IRON, RETICCTPCT in the last 72 hours. Urine analysis:    Component Value Date/Time   COLORURINE AMBER (A) 05/21/2017 2253   APPEARANCEUR CLOUDY (A) 05/21/2017 2253   LABSPEC 1.024 05/21/2017 2253   PHURINE 5.0 05/21/2017 2253   GLUCOSEU NEGATIVE 05/21/2017 2253   GLUCOSEU NEGATIVE 07/22/2008 1105   HGBUR LARGE (A) 05/21/2017 2253   BILIRUBINUR NEGATIVE 05/21/2017 2253   KETONESUR 5 (A) 05/21/2017 2253   PROTEINUR 100 (A) 05/21/2017 2253   UROBILINOGEN 0.2 11/19/2014 1430   NITRITE NEGATIVE 05/21/2017 2253   LEUKOCYTESUR LARGE (A) 05/21/2017 2253   Sepsis Labs: @LABRCNTIP (procalcitonin:4,lacticidven:4)  )No results found for this or any previous visit (from the past 240 hour(s)).    Studies: Ct Abdomen Pelvis Wo Contrast  Result Date: 05/22/2017 CLINICAL DATA:  Acute onset of nausea and vomiting. Generalized abdominal distention. Leukocytosis. EXAM: CT ABDOMEN AND PELVIS WITHOUT CONTRAST TECHNIQUE: Multidetector CT imaging of the abdomen and pelvis was performed following the standard protocol without IV contrast. COMPARISON:  CT of the abdomen and pelvis from 03/12/2015 FINDINGS: Lower chest: Patchy bibasilar airspace opacities may reflect atelectasis or possibly mild infection. The visualized portions of the mediastinum are  grossly unremarkable. Hepatobiliary: The liver is unremarkable in appearance. The patient is status post cholecystectomy, with clips noted at the gallbladder fossa. The common bile duct remains normal in caliber. Pancreas: The pancreas is within normal limits. Spleen: The spleen is unremarkable in appearance. Adrenals/Urinary Tract: The adrenal glands are grossly unremarkable in appearance. Bilateral renal stones measure up to 1.8 cm in size. There is a large 1.5 x 1.0 cm stone at the right renal pelvis, just above the right ureteropelvic junction. No significant hydronephrosis is seen. Bilateral renal parapelvic cysts are noted. No perinephric stranding is appreciated. Stomach/Bowel: There is dilatation of small-bowel loops to 3.7 cm in maximal diameter, with trace associated free fluid and mild mesenteric edema. There appears to be a transition point at the level of a moderate periumbilical hernia, which contains a loop of mid ileum. There is no definite evidence of strangulation. The colon is largely decompressed. Scattered diverticulosis is noted along the sigmoid colon, without evidence of diverticulitis. The stomach is largely filled with fluid and air, and is unremarkable in appearance. Vascular/Lymphatic: Diffuse calcification is seen along the abdominal  aorta and its branches. The abdominal aorta is otherwise grossly unremarkable. The inferior vena cava is grossly unremarkable. No retroperitoneal lymphadenopathy is seen. No pelvic sidewall lymphadenopathy is identified. Reproductive: The bladder is mildly distended and grossly unremarkable. The uterus is unremarkable in appearance. No suspicious adnexal masses are seen. The ovaries are grossly symmetric. Other: No additional soft tissue abnormalities are seen. Musculoskeletal: No acute osseous abnormalities are identified. The patient is status post decompression at the lower lumbar spine. The visualized musculature is unremarkable in appearance.  IMPRESSION: 1. Small-bowel obstruction due to herniation of the mid ileum at a moderate periumbilical hernia, with dilatation of small-bowel loops to 3.7 cm in maximal diameter, trace free fluid and mild mesenteric edema. No definite evidence of strangulation at this time. 2. Patchy bibasilar airspace opacities may reflect atelectasis or possibly mild infection. 3. Bilateral renal stones measure up to 1.8 cm in size. Large 1.5 x 1.0 cm stone at the right renal pelvis, just above the right ureteropelvic junction. No significant hydronephrosis seen. 4. Bilateral renal parapelvic cysts noted. 5. Scattered diverticulosis along the sigmoid colon, without evidence of diverticulitis. Aortic Atherosclerosis (ICD10-I70.0). Electronically Signed   By: Garald Balding M.D.   On: 05/22/2017 00:57    Scheduled Meds: . amLODipine  5 mg Oral Daily  . bisacodyl  10 mg Rectal Daily  . bupivacaine liposome  20 mL Infiltration On Call to OR  . buPROPion  150 mg Oral Daily  . cefoTEtan in Dextrose 5%      . Chlorhexidine Gluconate Cloth  6 each Topical Once  . levothyroxine  44 mcg Intravenous Daily  . lip balm  1 application Topical BID  . metoprolol tartrate  5 mg Intravenous Q8H  . multivitamin with minerals  1 tablet Oral Daily  . risperiDONE  1 mg Oral QHS    Continuous Infusions: . sodium chloride 125 mL/hr at 05/22/17 0625  . cefTRIAXone (ROCEPHIN)  IV    . famotidine (PEPCID) IV Stopped (05/22/17 0949)  . lactated ringers    . lactated ringers    . methocarbamol (ROBAXIN)  IV    . ondansetron (ZOFRAN) IV       LOS: 0 days     Alma Friendly, MD [facility]   If 7PM-7AM, please contact night-coverage [email or web address] Password [email or web address] 05/22/2017, 1:33 PM

## 2017-05-22 NOTE — Consult Note (Addendum)
[address] [place], [place] [address] Phone: [phone] FAX: [place]  07/12/1932 [id number]  CARE TEAM:  PCP: [name], MD  Outpatient Care Team: Patient Care Team: [name], MD as PCP - General (Family Medicine) [name], MD as Consulting Physician (Hematology) [name], MD as Consulting Physician (General Surgery) [name], [name], NP as Nurse Practitioner (Hematology and Oncology)  Inpatient Treatment Team: Treatment Team: Attending Provider: [name], DO; Registered Nurse: [name], RN; Registered Nurse: [name], RN; Technician: [name], NT; Consulting Physician: [name], Md, MD   This patient is a 83 y.o.female who presents today for surgical evaluation at the request of [name], DO, .   Chief complaint / Reason for evaluation: Incarcerated hernia with SBO  Elderly obese woman with dementia.  Lives with husband who also has dementia.  They live in assisted living.  Have a daytime caregiver.  She had worsening crampy abdominal pain and nausea and vomiting for the past 24 hours.  Cannot keep anything down with emesis.  Concerned by guardian service.  Told caregiver to bring the patient to the emergency room.  Concern for urinary tract infection but also pain full not at bellybutton.  CT scan shows small intestine incarcerated and a periumbilical ventral hernia.  That is the transition point.  Some swelling but no evidence of any gangrene or perforation.  Not it will be reduced by Dr. [name] with emergency nor Dr. [name] with the admitting hospitalist service.  Surgical consultation requested.  Was able to get a nasogastric tube with some feculent return.  She pulled it out.  He was able to be replaced.  She is letting it stay in for now.  I am not able to reduce a tender hernia at the bellybutton.  It does have some purplish  discoloration concerning for strangulation.  Patient notes that she has pain.  She knows that she is married.  However she cannot answer most questions.  Caregiver tonight notes the patient can transfer with the help of a walker.  She is a breast cancer survivor.  Believe she has atrial fibrillation and usually is on Xarelto, however she has not received any in the past 2 weeks according to her medicine list.  I have some notes talking that she had regional enteritis or Crohn's but I do not see any abdominal operations by [place] in the past dozen years.  He has a midline incision.  Evidence of clips in right upper quadrant consistent with cholecystectomy.  Apparently had appendectomy many years ago as well.   Assessment  [name]  84 y.o. female       Problem List:  Principal Problem:   Ventral hernia with bowel obstruction Active Problems:   Essential hypertension   Dementia with behavioral disturbance   AKI (acute kidney injury) ([place])   Acute lower UTI   Incarcerated ventral hernia   SBO from periumbilcal [place] incarcerated with small intestine  Plan:  Patient has a strangulated periumbilical hernia containing small intestine that is the transition point for a bowel obstruction.  She has nausea and vomiting.  Somewhat feculent output.  She has some purplish discoloration.  I think she requires emergent surgery to reduce and hopefully salvage versus resect the trapped small intestine.  The challenges that she is demented.  She gives verbal consent.  Her husband is demented.  They have transition to assisted living at [facility].  The guardianship service that the care worker has will not answer.  Apparently they have voicemail.  I called and left voicemail on the number of the apparent caregiver on call.  There is a [name] listed but neither phone number works.  One is disconnected.  I feel like this cannot wait another 6-8 hours until we can get a hold of someone.  I think this  requires emergent surgery.  Dr. [name] with [facility] and Dr. [name] with the admitting [facility] hospitalist service agree.  We will work to do emergent laparotomy in the hopes of reducing the hernia and correcting the bowel obstruction.  We will try to salvage small intestine.  She does have risks of heart attack, stroke, and death.  She does has its risk of needing bowel resection.  Hopefully ostomy not very likely since this is small intestine.  It will be a suboptimal repair with stitches only since I cannot do mesh in emergent situation.  However the risks of waiting make the morbidity higher.  Likely to need bowel resection with possible leak and wound infection and abscess.  She is obstructed and cannot keep anything down.  This will not get better until she gets surgery.  I think this requires emergent treatment.  While, the patient is demented, she did give verbal consent and wishes to proceed.  She is in pain and has been vomiting.  She would like to proceed with surgery in the hopes of correcting these problems  -VTE prophylaxis- SCDs, etc -Blood pressure and heart rate control per medicine service -mobilize as tolerated to help recovery  60 minutes spent in review, evaluation, examination, counseling, and coordination of care.  More than 50% of that time was spent in counseling.  [name], M.D., F.A.C.S. Gastrointestinal and Minimally Invasive Surgery [facility] [address], [place], [place] [address] [phone] Main / Paging   05/22/2017      Past Medical History:  Diagnosis Date  . Bipolar affective ([place])    History of psychosis  . C. difficile diarrhea   . CAD (coronary artery disease)    LAD 30% 2006  . Cerebrovascular disease   . Colonic polyp   . Crohn's disease ([place])   . Diverticulosis of colon   . Fibromyalgia   . GERD (gastroesophageal reflux disease)   . History of atrial flutter   . History of  bronchitis   . History of headache   . History of hyperparathyroidism   . History of tuberculosis   . Hyperlipemia   . Hypertension   . Hypothyroidism   . IBS (irritable bowel syndrome)   . Memory disturbance   . 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 Obesity   . OSA (obstructive sleep apnea)    denies 12/07/15  . Osteopenia   . Other diseases of lung, not elsewhere classified   . Psychosis ([place])   . Radiation 05/2008   5000 cGy to left lower eyelid, parotid lymphatics and upper neck  . Restless legs syndrome (RLS)   . Syncope   . Tuberculosis     Past Surgical History:  Procedure Laterality Date  . APPENDECTOMY    . BLADDER SUSPENSION    . BREAST LUMPECTOMY WITH RADIOACTIVE SEED LOCALIZATION Left 12/08/2015   Procedure: BREAST LUMPECTOMY WITH RADIOACTIVE SEED LOCALIZATION;  Surgeon: Autumn Messing III, MD;  Location: [facility];  Service: General;  Laterality: Left;  . CATARACT EXTRACTION Bilateral   . CHOLECYSTECTOMY    . eye lid surgery  04/04/2011   [facility] by Dr. Norlene Duel  . HEEL SPUR EXCISION    . INCISION AND DRAINAGE PERIRECTAL ABSCESS    . LUMBAR LAMINECTOMY    . Moh';s surg left lower eye lid surgery and lid reconstruction surgery  2/10  . NASAL SINUS SURGERY Right    Right maxillary  . Parathyroid adenoma surgery    . POSTERIOR CERVICAL FUSION/FORAMINOTOMY N/A 10/12/2013   Procedure: Cervical five-Thoracic two cervico-thoracic posterior fusion;  Surgeon: [name], MD;  Location: [facility];  Service: Neurosurgery;  Laterality: N/A;  . TONSILLECTOMY AND ADENOIDECTOMY      Social History   Socioeconomic History  . Marital status: Married    Spouse name: Barnabas Lister  . Number of children: 2  . Years of education: Not on file  . Highest education level: Not on file  Occupational History    Employer: RETIRED  Social Needs  . Financial resource strain: Not on file  . Food insecurity:    Worry: Not on file    Inability: Not on file  . Transportation needs:     Medical: Not on file    Non-medical: Not on file  Tobacco Use  . Smoking status: Former Smoker    Years: 6.00    Types: Cigarettes    Last attempt to quit: 01/08/1960    Years since quitting: 57.4  . Smokeless tobacco: Never Used  Substance and Sexual Activity  . Alcohol use: No  . Drug use: No  . Sexual activity: Not on file  Lifestyle  . Physical activity:    Days per week: Not on file    Minutes per session: Not on file  . Stress: Not on file  Relationships  . Social connections:    Talks on phone: Not on file    Gets together: Not on file    Attends religious service: Not on file    Active member of club or organization: Not on file    Attends meetings of clubs or organizations: Not on file    Relationship status: Not on file  . Intimate partner violence:    Fear of current or ex partner: Not on file    Emotionally abused: Not on file    Physically abused: Not on file    Forced sexual activity: Not on file  Other Topics Concern  . Not on file  Social History Narrative   Patient is married with 2 children   Patient is right handed   Patient is a retired Marine scientist   Patient does not drink any caffeine.    Family History  Problem Relation Age of Onset  . Heart failure Father   . Arthritis Mother   . Hypertension Mother   . Ulcers Mother   . Colon cancer Maternal Grandmother   . Pulmonary embolism Unknown     Current Facility-Administered Medications  Medication Dose Route Frequency Provider Last Rate Last Dose  . 0.9 %  sodium chloride infusion   Intravenous Continuous [name], DO 125 mL/hr at 05/22/17 0152    . acetaminophen (TYLENOL) tablet 650 mg  650 mg Oral Q6H PRN [name], DO       Or  . acetaminophen (TYLENOL) suppository 650 mg  650 mg Rectal Q6H PRN [name], DO      . amLODipine (NORVASC) tablet 5 mg  5  mg Oral Daily Jennette Kettle M, DO      . anastrozole (ARIMIDEX) tablet 1 mg  1 mg Oral Daily [name], Jared M, DO      .  atorvastatin (LIPITOR) tablet 40 mg  40 mg Oral Daily [name], Jared M, DO      . azithromycin [facility]) 500 mg in sodium chloride 0.9 % 250 mL IVPB  500 mg Intravenous Once Ward, Kristen N, DO 250 mL/hr at 05/22/17 0151 500 mg at 05/22/17 0151  . buPROPion (WELLBUTRIN XL) 24 hr tablet 150 mg  150 mg Oral Daily [name], Jared M, DO      . cefTRIAXone (ROCEPHIN) 1 g in sodium chloride 0.9 % 100 mL IVPB  1 g Intravenous Q24H [name], Jared M, DO      . dicyclomine (BENTYL) capsule 10 mg  10 mg Oral BID Jennette Kettle M, DO      . donepezil (ARICEPT) tablet 5 mg  5 mg Oral QHS Jennette Kettle M, DO      . levothyroxine (SYNTHROID, LEVOTHROID) tablet 88 mcg  88 mcg Oral QAC breakfast Jennette Kettle M, DO      . memantine [facility]) tablet 10 mg  10 mg Oral BID Jennette Kettle M, DO      . multivitamin with minerals tablet 1 tablet  1 tablet Oral Daily [name], Jared M, DO      . ondansetron [facility]) tablet 4 mg  4 mg Oral Q6H PRN [name], DO       Or  . ondansetron [facility]) injection 4 mg  4 mg Intravenous Q6H PRN [name], DO      . Propylene Glycol 0.6 % SOLN 1 drop  1 drop Both Eyes Daily [name], Jared M, DO      . risperiDONE (RISPERDAL) tablet 0.5 mg  0.5 mg Oral QHS [name], DO       Current Outpatient Medications  Medication Sig Dispense Refill  . amLODipine (NORVASC) 5 MG tablet TAKE 1 TABLET (5 MG TOTAL) BY MOUTH DAILY. 90 tablet 2  . anastrozole (ARIMIDEX) 1 MG tablet Take 1 tablet (1 mg total) by mouth daily. 90 tablet 3  . aspirin 81 MG EC tablet Take 81 mg by mouth.    [place] Kitchen atorvastatin (LIPITOR) 40 MG tablet TAKE 1 TABLET DAILY 90 tablet 1  . buPROPion (WELLBUTRIN XL) 150 MG 24 hr tablet Take 150 mg by mouth daily.    [place] Kitchen dicyclomine (BENTYL) 10 MG capsule Take 1 capsule (10 mg total) by mouth 3 (three) times daily before meals. (Patient taking differently: Take 10 mg by mouth 2 (two) times daily. ) 20 capsule 0  . donepezil (ARICEPT) 5 MG tablet Take 5 mg by  mouth at bedtime.    [place] Kitchen levothyroxine (SYNTHROID, LEVOTHROID) 88 MCG tablet Take 1 tablet (88 mcg total) by mouth daily before breakfast. Pt needs an appointment for further refills 30 tablet 2  . memantine (NAMENDA) 10 MG tablet TAKE 1 TABLET (10 MG TOTAL) BY MOUTH TWO  TIMES DAILY. 180 tablet 5  . Multiple Vitamin (MULTIVITAMIN WITH MINERALS) TABS tablet Take 1 tablet by mouth daily.    [place] Kitchen Propylene Glycol (SYSTANE BALANCE) 0.6 % SOLN Place 1 drop into both eyes daily.    . risperiDONE (RISPERDAL) 1 MG tablet Take 0.5 tablets (0.5 mg total) at bedtime by mouth. 45 tablet 1  . cephALEXin (KEFLEX) 500 MG capsule Take 1 capsule (500 mg total) by mouth 4 (four) times daily. [place]  capsule 0     Allergies  Allergen Reactions  . Prednisone Other (See Comments)    Patient had psychiatric episode with hallucinations. Caused 3.5 week long hospitalization & loss of memory   . Carbamazepine Nausea Only and Other (See Comments)    Tegretol; dizziness, blurred vision, nausea, headache, insomnia  . Codeine Nausea And Vomiting  . Demerol [Meperidine] Nausea And Vomiting  . Diclofenac Sodium Other (See Comments)    severe headache  . Fentanyl Nausea And Vomiting and Other (See Comments)    IV Fentanyl; caused nausea and vomiting Fentanyl patch; caused chest pain, HBP, with second patch experienced vertigo and nausea  . Fluoxetine Hcl Nausea Only and Other (See Comments)    dizziness, blurred vision, nausea, headache, insomnia  . Gentamicin     Other reaction(s): EYE IRRITATION  . Hydromorphone Hcl Nausea And Vomiting  . Ibuprofen Other (See Comments)    Caused ulcers and colitis  . Indomethacin Nausea Only and Other (See Comments)    dizziness, blurred vision, nausea, headache, insomnia  . Lisinopril Cough  . Meperidine Hcl Nausea And Vomiting  . Morphine Nausea And Vomiting  . Pregabalin Other (See Comments)    severe restless legs, insomnia  . Propranolol Hcl Nausea Only and Other (See Comments)     dizziness, blurred vision, nausea, headache, insomnia  . Refresh Lacri-Lube [Eye Lubricant] Swelling    Caused swelling, redness, hard crust on eyelids   . Ropinirole Hcl Other (See Comments)    severe restless legs and insomnia  . Sulfamethoxazole Nausea Only and Other (See Comments)    gas, loose stools  . White Petrolatum-Mineral Oil  [Aquaphilic]     Other reaction(s): SWELLING/EDEMA  . Bacitracin Hives, Itching and Rash    redness    ROS:   All other systems reviewed & are per HPI.  Cannot get ROS with her dementia BP 131/69   Pulse (!) 104   Temp 99 F (37.2 C) (Oral)   Resp (!) 22   SpO2 93%   Physical Exam: General: Pt awake but confused & anxious Eyes: PERRL, normal EOM. Sclera nonicteric Neuro: CN II-XII intact w/o focal sensory/motor deficits. Lymph: No head/neck/groin lymphadenopathy Psych:  No delerium/psychosis/paranoia HENT: Normocephalic, Mucus membranes moist.  No thrush Neck: Supple, No tracheal deviation Chest: No pain.  Good respiratory excursion. CV:  Pulses intact.  Regular rhythm Abdomen: Soft, Obese.  6cm hard mass painful & not reducible.  Some purple discoloration in SQ.  Prior midline incision Gen:  No inguinal hernias.  No inguinal lymphadenopathy.   Ext:  SCDs BLE.  No significant edema.  No cyanosis Skin: No petechiae / purpurea.  No major sores Musculoskeletal: No severe joint pain.  Good ROM major joints   Results:   Labs: Results for orders placed or performed during the hospital encounter of 05/21/17 (from the past 48 hour(s))  CBC with Differential     Status: Abnormal   Collection Time: 05/21/17  8:58 PM  Result Value Ref Range   WBC 18.6 (H) 4.0 - 10.5 K/uL   RBC 4.59 3.87 - 5.11 MIL/uL   Hemoglobin 10.9 (L) 12.0 - 15.0 g/dL   HCT 36.4 36.0 - 46.0 %   MCV 79.3 78.0 - 100.0 fL   MCH 23.7 (L) 26.0 - 34.0 pg   MCHC 29.9 (L) 30.0 - 36.0 g/dL   RDW 17.0 (H) 11.5 - 15.5 %   Platelets 425 (H) 150 - 400 K/uL   Neutrophils Relative %  82 %  Neutro Abs 15.3 (H) 1.7 - 7.7 K/uL   Lymphocytes Relative 9 %   Lymphs Abs 1.6 0.7 - 4.0 K/uL   Monocytes Relative 9 %   Monocytes Absolute 1.6 (H) 0.1 - 1.0 K/uL   Eosinophils Relative 0 %   Eosinophils Absolute 0.0 0.0 - 0.7 K/uL   Basophils Relative 0 %   Basophils Absolute 0.0 0.0 - 0.1 K/uL    Comment: Performed at [facility], [place] [address] [place], [place] [address]  Comprehensive metabolic panel     Status: Abnormal   Collection Time: 05/21/17  8:58 PM  Result Value Ref Range   Sodium 143 135 - 145 mmol/L   Potassium 4.7 3.5 - 5.1 mmol/L   Chloride 104 101 - 111 mmol/L   CO2 21 (L) 22 - 32 mmol/L   Glucose, Bld 164 (H) 65 - 99 mg/dL   BUN 30 (H) 6 - 20 mg/dL   Creatinine, Ser 1.66 (H) 0.44 - 1.00 mg/dL   Calcium 9.6 8.9 - 10.3 mg/dL   Total Protein 7.7 6.5 - 8.1 g/dL   Albumin 3.8 3.5 - 5.0 g/dL   AST 26 15 - 41 U/L   ALT 20 14 - 54 U/L   Alkaline Phosphatase 113 38 - 126 U/L   Total Bilirubin 0.7 0.3 - 1.2 mg/dL   GFR calc non Af Amer 28 (L) >60 mL/min   GFR calc Af Amer 32 (L) >60 mL/min    Comment: (NOTE) The eGFR has been calculated using the CKD EPI equation. This calculation has not been validated in all clinical situations. eGFR's persistently <60 mL/min signify possible Chronic Kidney Disease.    Anion gap 18 (H) 5 - 15    Comment: Performed at [facility], [place] [address] [place], [place] [address]  Lipase, blood     Status: None   Collection Time: 05/21/17  8:58 PM  Result Value Ref Range   Lipase 32 11 - 51 U/L    Comment: Performed at [facility], [place] [address] [place], [place] [address]  Urinalysis, Routine w reflex microscopic     Status: Abnormal   Collection Time: 05/21/17 10:53 PM  Result Value Ref Range   Color, Urine AMBER (A) YELLOW    Comment: BIOCHEMICALS MAY BE AFFECTED BY COLOR   APPearance CLOUDY (A) CLEAR   Specific Gravity, Urine 1.024 1.005 - 1.030   pH 5.0 5.0  - 8.0   Glucose, UA NEGATIVE NEGATIVE mg/dL   Hgb urine dipstick LARGE (A) NEGATIVE   Bilirubin Urine NEGATIVE NEGATIVE   Ketones, ur 5 (A) NEGATIVE mg/dL   Protein, ur 100 (A) NEGATIVE mg/dL   Nitrite NEGATIVE NEGATIVE   Leukocytes, UA LARGE (A) NEGATIVE   RBC / HPF >50 (H) 0 - 5 RBC/hpf   WBC, UA >50 (H) 0 - 5 WBC/hpf   Bacteria, UA FEW (A) NONE SEEN   Squamous Epithelial / LPF 0-5 0 - 5   WBC Clumps PRESENT    Mucus PRESENT    Hyaline Casts, UA PRESENT     Comment: Performed at [facility], [place] [address], [place], [place] [address]    Imaging / Studies: Ct Abdomen Pelvis Wo Contrast  Result Date: 05/22/2017 CLINICAL DATA:  Acute onset of nausea and vomiting. Generalized abdominal distention. Leukocytosis. EXAM: CT ABDOMEN AND PELVIS WITHOUT CONTRAST TECHNIQUE: Multidetector CT imaging of the abdomen and pelvis was performed following the standard protocol without IV contrast. COMPARISON:  CT of the abdomen and pelvis from 03/12/2015 FINDINGS: Lower chest: Patchy bibasilar airspace opacities may reflect atelectasis or possibly mild infection. The visualized portions of the mediastinum are grossly unremarkable. Hepatobiliary: The liver is unremarkable in appearance. The patient is status post cholecystectomy, with clips noted at the gallbladder fossa. The common bile duct remains normal in caliber. Pancreas: The pancreas is within normal limits. Spleen: The spleen is unremarkable in appearance. Adrenals/Urinary Tract: The adrenal glands are grossly unremarkable in appearance. Bilateral renal stones measure up to 1.8 cm in size. There is a large 1.5 x 1.0 cm stone at the right renal pelvis, just above the right ureteropelvic junction. No significant hydronephrosis is seen. Bilateral renal parapelvic cysts are noted. No perinephric stranding is appreciated. Stomach/Bowel: There is dilatation of small-bowel loops to 3.7 cm in maximal diameter, with trace associated free fluid  and mild mesenteric edema. There appears to be a transition point at the level of a moderate periumbilical hernia, which contains a loop of mid ileum. There is no definite evidence of strangulation. The colon is largely decompressed. Scattered diverticulosis is noted along the sigmoid colon, without evidence of diverticulitis. The stomach is largely filled with fluid and air, and is unremarkable in appearance. Vascular/Lymphatic: Diffuse calcification is seen along the abdominal aorta and its branches. The abdominal aorta is otherwise grossly unremarkable. The inferior vena cava is grossly unremarkable. No retroperitoneal lymphadenopathy is seen. No pelvic sidewall lymphadenopathy is identified. Reproductive: The bladder is mildly distended and grossly unremarkable. The uterus is unremarkable in appearance. No suspicious adnexal masses are seen. The ovaries are grossly symmetric. Other: No additional soft tissue abnormalities are seen. Musculoskeletal: No acute osseous abnormalities are identified. The patient is status post decompression at the lower lumbar spine. The visualized musculature is unremarkable in appearance. IMPRESSION: 1. Small-bowel obstruction due to herniation of the mid ileum at a moderate periumbilical hernia, with dilatation of small-bowel loops to 3.7 cm in maximal diameter, trace free fluid and mild mesenteric edema. No definite evidence of strangulation at this time. 2. Patchy bibasilar airspace opacities may reflect atelectasis or possibly mild infection. 3. Bilateral renal stones measure up to 1.8 cm in size. Large 1.5 x 1.0 cm stone at the right renal pelvis, just above the right ureteropelvic junction. No significant hydronephrosis seen. 4. Bilateral renal parapelvic cysts noted. 5. Scattered diverticulosis along the sigmoid colon, without evidence of diverticulitis. Aortic Atherosclerosis (ICD10-I70.0). Electronically Signed   By: Garald Balding M.D.   On: 05/22/2017 00:57     Medications / Allergies: per chart  Antibiotics: Anti-infectives (From admission, onward)   Start     Dose/Rate Route Frequency Ordered Stop   05/22/17 2200  cefTRIAXone (ROCEPHIN) 1 g in sodium chloride 0.9 % 100 mL IVPB     1 g 200 mL/hr over 30 Minutes Intravenous Every 24 hours 05/22/17 0139     05/22/17 0130  azithromycin (ZITHROMAX) 500 mg in sodium chloride 0.9 % 250 mL IVPB     500 mg 250 mL/hr over 60 Minutes Intravenous  Once 05/22/17 0122     05/21/17 2345  cefTRIAXone (ROCEPHIN) 1 g in sodium chloride 0.9 % 100 mL IVPB     1 g 200 mL/hr over 30 Minutes Intravenous  Once 05/21/17 2332 05/22/17 0117   05/21/17 0000  cephALEXin (KEFLEX) 500 MG capsule     500 mg Oral 4 times daily 05/21/17 2349          Note: Portions of this report may have been  transcribed using voice recognition software. Every effort was made to ensure accuracy; however, inadvertent computerized transcription errors may be present.   Any transcriptional errors that result from this process are unintentional.    [name], M.D., F.A.C.S. Gastrointestinal and Minimally Invasive Surgery [facility] [address], [place], [place] [address] [phone] Main / Paging   05/22/2017

## 2017-05-22 NOTE — Anesthesia Preprocedure Evaluation (Signed)
Anesthesia Evaluation  Patient identified by MRN, date of birth, ID band Patient awake    Reviewed: Allergy & Precautions, H&P , NPO status , Patient's Chart, lab work & pertinent test results  Airway Mallampati: III  TM Distance: >3 FB Neck ROM: Full    Dental no notable dental hx. (+) Teeth Intact, Dental Advisory Given   Pulmonary sleep apnea , former smoker,    Pulmonary exam normal breath sounds clear to auscultation       Cardiovascular hypertension, Pt. on medications + CAD   Rhythm:Regular Rate:Normal     Neuro/Psych  Headaches, PSYCHIATRIC DISORDERS Bipolar Disorder Schizophrenia Dementia    GI/Hepatic Neg liver ROS, GERD  Medicated and Controlled,  Endo/Other  Hypothyroidism   Renal/GU Renal InsufficiencyRenal diseasenegative Renal ROS  negative genitourinary   Musculoskeletal  (+) Arthritis , Osteoarthritis,  Fibromyalgia -  Abdominal   Peds  Hematology negative hematology ROS (+)   Anesthesia Other Findings   Reproductive/Obstetrics negative OB ROS                             Anesthesia Physical  Anesthesia Plan  ASA: III and emergent  Anesthesia Plan: General   Post-op Pain Management:    Induction: Intravenous, Rapid sequence and Cricoid pressure planned  PONV Risk Score and Plan: 4 or greater and Ondansetron and Dexamethasone  Airway Management Planned: Oral ETT  Additional Equipment:   Intra-op Plan:   Post-operative Plan: Extubation in OR  Informed Consent: I have reviewed the patients History and Physical, chart, labs and discussed the procedure including the risks, benefits and alternatives for the proposed anesthesia with the patient or authorized representative who has indicated his/her understanding and acceptance.   Dental advisory given  Plan Discussed with: CRNA  Anesthesia Plan Comments:         Anesthesia Quick Evaluation

## 2017-05-22 NOTE — Transfer of Care (Signed)
Immediate Anesthesia Transfer of Care Note  Patient: [name]  Procedure(s) Performed: EXPLORATORY LAPAROTOMY reduction repair incarcerated ventral wall hernia small bowel reseection (N/A Abdomen)  Patient Location: PACU  Anesthesia Type:General  Level of Consciousness: awake, alert  and oriented  Airway & Oxygen Therapy: Patient Spontanous Breathing and Patient connected to face mask oxygen  Post-op Assessment: Report given to RN and Post -op Vital signs reviewed and stable  Post vital signs: Reviewed and stable  Last Vitals:  Vitals Value Taken Time  BP 161/78 05/22/2017  6:07 AM  Temp    Pulse 105 05/22/2017  6:11 AM  Resp 14 05/22/2017  6:11 AM  SpO2 97 % 05/22/2017  6:11 AM  Vitals shown include unvalidated device data.  Last Pain:  Vitals:   05/21/17 2035  TempSrc: Oral  PainSc:          Complications: No apparent anesthesia complications

## 2017-05-22 NOTE — ED Notes (Signed)
Unable to obtained Consent for OR, unable to reach/contact the Guardian assigned; [name]. Dr. [name] , Dr. Johney Maine and this Nurse attempted to call several times, unsuccessful . Pt.'s caregiver at bedside helping to contact the said Guardian ,still unable to reach. Pt.'s facility notified of the emergency surgery. Dr. [name] is also notified. This Nurse was able to speak to the Guardian earlier and was told that she can not be contacted after 11pm because she will be out of town by then, also confirmed that pt. Was a full code.

## 2017-05-22 NOTE — Plan of Care (Signed)

## 2017-05-22 NOTE — Progress Notes (Signed)
CSW following to assist with patient disposition. CSW called [employer] and spoke to [employer]. That inform CSW the patient has a Startup. CSW left voicemail, awaiting return call.  CSW will continue to assist with placement.   Kathrin Greathouse, Latanya Presser, MSW Clinical Social Worker  [phone] 05/22/2017  2:20 PM

## 2017-05-23 ENCOUNTER — Encounter (HOSPITAL_COMMUNITY): Payer: Self-pay | Admitting: Surgery

## 2017-05-23 DIAGNOSIS — G4733 Obstructive sleep apnea (adult) (pediatric): Secondary | ICD-10-CM

## 2017-05-23 DIAGNOSIS — I10 Essential (primary) hypertension: Secondary | ICD-10-CM

## 2017-05-23 DIAGNOSIS — F319 Bipolar disorder, unspecified: Secondary | ICD-10-CM

## 2017-05-23 LAB — HEPARIN LEVEL (UNFRACTIONATED): Heparin Unfractionated: <0.1 IU/mL — ABNORMAL LOW (ref 0.30–0.70)

## 2017-05-23 LAB — BASIC METABOLIC PANEL
Anion gap: 11 (ref 5–15)
BUN: 27 mg/dL — ABNORMAL HIGH (ref 6–20)
CALCIUM: 8.1 mg/dL — AB (ref 8.9–10.3)
CO2: 23 mmol/L (ref 22–32)
CREATININE: 1.18 mg/dL — AB (ref 0.44–1.00)
Chloride: 112 mmol/L — ABNORMAL HIGH (ref 101–111)
GFR calc Af Amer: 48 mL/min — ABNORMAL LOW (ref >60)
GFR, EST NON AFRICAN AMERICAN: 42 mL/min — AB (ref >60)
Glucose, Bld: 112 mg/dL — ABNORMAL HIGH (ref 65–99)
Potassium: 4 mmol/L (ref 3.5–5.1)
Sodium: 146 mmol/L — ABNORMAL HIGH (ref 135–145)

## 2017-05-23 LAB — CBC
HCT: 28.4 % — ABNORMAL LOW (ref 36.0–46.0)
HEMOGLOBIN: 8.2 g/dL — AB (ref 12.0–15.0)
MCH: 23.3 pg — AB (ref 26.0–34.0)
MCHC: 28.9 g/dL — AB (ref 30.0–36.0)
MCV: 80.7 fL (ref 78.0–100.0)
Platelets: 268 10*3/uL (ref 150–400)
RBC: 3.52 MIL/uL — ABNORMAL LOW (ref 3.87–5.11)
RDW: 17.3 % — AB (ref 11.5–15.5)
WBC: 7.9 10*3/uL (ref 4.0–10.5)

## 2017-05-23 LAB — APTT: APTT: 30 s (ref 24–36)

## 2017-05-23 MED ORDER — HEPARIN (PORCINE) IN NACL 100-0.45 UNIT/ML-% IJ SOLN
1100.0000 [IU]/h | INTRAMUSCULAR | Status: DC
  Administered 2017-05-23: 1100 [IU]/h via INTRAVENOUS
  Filled 2017-05-23: qty 250

## 2017-05-23 MED ORDER — ENOXAPARIN SODIUM 40 MG/0.4ML ~~LOC~~ SOLN
40.0000 mg | SUBCUTANEOUS | Status: DC
  Administered 2017-05-24 – 2017-05-29 (×6): 40 mg via SUBCUTANEOUS
  Filled 2017-05-23 (×6): qty 0.4

## 2017-05-23 NOTE — Progress Notes (Addendum)
Spoke with Patient's daughter about patient status/condition. All questions about surgery answered.  I asked whether it was known whether patient was actually still on xarelto or not PTA. Patient's daughter going to reach out to [name] who is patient's POA. I do not see much benefit in anticoagulation in this patient given increased fall risk.   Per pharmacist note - SNF stating xarelto was d/c'd on 05/07/17. Will discontinue heparin at this time and give DVT prophylaxis only. If we find out otherwise that patient was still taking xarelto, can resume when tolerating PO.  Brigid Re , [facility] Surgery 05/23/2017, 3:31 PM Pager: [phone]  Agree with above. There is some question about he patient being on anticoagulation prior to admission. With her dementia, I agree this should be held for now.  Alphonsa Overall, MD, [facility] Surgery Pager: [phone] Office phone:  [phone]

## 2017-05-23 NOTE — Clinical Social Work Note (Addendum)
Clinical Social Work Assessment  Patient Details  Name: [name] MRN: [id number] Date of Birth: [birth year]  Date of referral:  05/23/17               Reason for consult:  Facility Placement                Permission sought to share information with:  Guardian Permission granted to share information::     Name::     [name]   Agency::  [facility]   Relationship::  Legal Guardian [name] [phone]) is her assigned representative. )  Contact Information:   [phone]) is her assigned representative. If you can not reach her call: [name] [phone])   Housing/Transportation Living arrangements for the past 2 months:  [place] Information:  Facility, Guardian Patient Interpreter Needed:  None Criminal Activity/Legal Involvement Pertinent to Current Situation/Hospitalization:  No - Comment as needed Significant Relationships:  Warehouse manager Lives with:  Facility Resident Do you feel safe going back to the place where you live?  Yes Need for family participation in patient care:     Care giving concerns:   Patient presented with abdominal pain. Patient was found have high grade SBO secondary to incarcerated umbilical hernia.   Social Worker assessment / plan:  Patient has Advanced Dementia.  Patient has legal Guardian(See above)  CSW spoke to the med tech. At [facility] about the patient level of functioning at the Assisted Living facility. She reports the patient has a legal Guardian and provided CSW with the information. She reports the patient needs assistance with bathing and dressing. The patient can ambulate w/ walker.    Plan: to be determine.   Employment status:  Retired Forensic scientist:  Medicare PT Recommendations:   Not assessed at this time.  Information / Referral to community resources:     Patient/Family's Response to care: No family at bedside.   Patient/Family's  Understanding of and Emotional Response to Diagnosis, Current Treatment, and Prognosis:  No family at bedside.   Emotional Assessment Appearance:  Developmentally appropriate Attitude/Demeanor/Rapport:    Affect (typically observed):    Orientation:  Oriented to Self Alcohol / Substance use:  Not Applicable Psych involvement (Current and /or in the community):  No (Comment)  Discharge Needs  Concerns to be addressed:  Discharge Planning Concerns Readmission within the last 30 days:  No Current discharge risk:  Dependent with Mobility Barriers to Discharge:  Continued Medical Work up   [employer], LCSW 05/23/2017, 1:32 PM

## 2017-05-23 NOTE — Progress Notes (Signed)
PROGRESS NOTE  LEIANNA Hayes SWN:[id number] DOB: 22-Mar-1932 DOA: 05/21/2017 PCP: [name], MD  HPI/Recap of past 24 hours: HPI from Dr [name] on 05/22/17 [name] is a 84 y.o. female with medical history significant of Advanced dementia, HTN, Hypothyroidism, HLD, bipolar, presents to the ED with c/o abd pain, N/V.  Symptoms ongoing for the past 24 hours.  Cannot keep anything down by mouth. Guardian service was concerned and told caregiver to bring patient to ED. In the ED, found to have UTI, but more significantly found to have high grade SBO secondary to incarcerated umbilical hernia.  Gen surg was consulted, Dr. Johney Maine who performed emergent surgery on 05/22/17.  Today, pt resting, in no distress. Denies any new complaints, but noted abdominal pain on palpation. Remains NPO.   Assessment/Plan: Principal Problem:   Incarcerated ventral hernia s/p SB resection & primary hernia repair 05/22/2017 Active Problems:   OBESITY   OBSTRUCTIVE SLEEP APNEA   Essential hypertension   GASTROESOPHAGEAL REFLUX DISEASE   Memory impairment   Dementia with behavioral disturbance   AKI (acute kidney injury) ([place])   Acute lower UTI   SBO (small bowel obstruction) s/p SB resection 05/22/2017   Ventral hernia with bowel obstruction   Bipolar affective (HCC)  Incarcerated ventral hernia/ Hx of crohns dsz Status post ex lap, partial omentectomy, primary repair of ventral hernia, small bowel resection x1 on 05/22/2017 by Dr. Johney Maine Hemodynamically stable Currently n.p.o., continue IV fluids NGT to LIWS Management primarily by surgery (pain meds, dvt)  AKI Improving Likely prerenal due to above Continue IV fluids Daily BMP  UTI UA with large leukocytes, few bacteria,> 50 WBC clumps UC showed >100,000 gram neg rods Continue IV Rocephin  Acute blood loss anemia Likely from post op Baseline around 10 Daily CBC  Hypertension Uncontrolled Held home p.o. meds Continue IV Lopressor  scheduled and as needed  Hx of atrial flutter Rate controlled Anticoagulation was recently held/stopped at ALF on 05/07/17 unsure if this was temp. Awaiting POA to confirm   Hypothyroidism Continue IV Synthroid  Bipolar Held home risperidone, bupropion May restart this soon as possible to avoid withdrawal  Dementia Held home donepezil, Namenda     Code Status: Full  Family Communication: None at bedside  Disposition Plan: Back to assisted living facility vs SNF   Consultants:  General surgery  Procedures:  Ex lap on 05/22/2017  Antimicrobials:  IV Rocephin  DVT prophylaxis: Lovenox   Objective: Vitals:   05/23/17 0600 05/23/17 0800 05/23/17 1200 05/23/17 1600  BP: (!) 147/69 (!) 151/56 (!) 153/49   Pulse:      Resp: 18 20 17    Temp:  99.5 F (37.5 C) 99.1 F (37.3 C) 99.6 F (37.6 C)  TempSrc:  Axillary Axillary Axillary  SpO2: 97% 97% 98%   Weight:        Intake/Output Summary (Last 24 hours) at 05/23/2017 1626 Last data filed at 05/23/2017 1600 Gross per 24 hour  Intake 3945.88 ml  Output 2325 ml  Net 1620.88 ml   Filed Weights   05/22/17 0701  Weight: 83 kg (182 lb 15.7 oz)    Exam:   General: NAD, alert, awake, oriented to place and self  Cardiovascular: S1, S2 present  Respiratory: CTAB  Abdomen: Soft, tenderness around surgical site, nondistended, hypoactive bowel sounds, NG tube in place  Musculoskeletal: No pedal edema bilaterally  Skin: Normal  Psychiatry: Unable to assess   Data Reviewed: CBC: Recent Labs  Lab 05/21/17  2058 05/22/17 0725 05/23/17 0329  WBC 18.6* 8.2 7.9  NEUTROABS 15.3*  --   --   HGB 10.9* 9.4* 8.2*  HCT 36.4 32.2* 28.4*  MCV 79.3 79.7 80.7  PLT 425* 345 076   Basic Metabolic Panel: Recent Labs  Lab 05/21/17 2058 05/22/17 0725 05/23/17 0329  NA 143 142 146*  K 4.7 4.7 4.0  CL 104 104 112*  CO2 21* 24 23  GLUCOSE 164* 156* 112*  BUN 30* 35* 27*  CREATININE 1.66* 1.82* 1.18*  CALCIUM  9.6 8.6* 8.1*   GFR: Estimated Creatinine Clearance: 38.3 mL/min (A) (by C-G formula based on SCr of 1.18 mg/dL (H)). Liver Function Tests: Recent Labs  Lab 05/21/17 2058  AST 26  ALT 20  ALKPHOS 113  BILITOT 0.7  PROT 7.7  ALBUMIN 3.8   Recent Labs  Lab 05/21/17 2058  LIPASE 32   No results for input(s): AMMONIA in the last 168 hours. Coagulation Profile: No results for input(s): INR, PROTIME in the last 168 hours. Cardiac Enzymes: No results for input(s): CKTOTAL, CKMB, CKMBINDEX, TROPONINI in the last 168 hours. BNP (last 3 results) No results for input(s): PROBNP in the last 8760 hours. HbA1C: No results for input(s): HGBA1C in the last 72 hours. CBG: No results for input(s): GLUCAP in the last 168 hours. Lipid Profile: No results for input(s): CHOL, HDL, LDLCALC, TRIG, CHOLHDL, LDLDIRECT in the last 72 hours. Thyroid Function Tests: No results for input(s): TSH, T4TOTAL, FREET4, T3FREE, THYROIDAB in the last 72 hours. Anemia Panel: No results for input(s): VITAMINB12, FOLATE, FERRITIN, TIBC, IRON, RETICCTPCT in the last 72 hours. Urine analysis:    Component Value Date/Time   COLORURINE AMBER (A) 05/21/2017 2253   APPEARANCEUR CLOUDY (A) 05/21/2017 2253   LABSPEC 1.024 05/21/2017 2253   PHURINE 5.0 05/21/2017 2253   GLUCOSEU NEGATIVE 05/21/2017 2253   GLUCOSEU NEGATIVE 07/22/2008 1105   HGBUR LARGE (A) 05/21/2017 2253   BILIRUBINUR NEGATIVE 05/21/2017 2253   KETONESUR 5 (A) 05/21/2017 2253   PROTEINUR 100 (A) 05/21/2017 2253   UROBILINOGEN 0.2 11/19/2014 1430   NITRITE NEGATIVE 05/21/2017 2253   LEUKOCYTESUR LARGE (A) 05/21/2017 2253   Sepsis Labs: @LABRCNTIP (procalcitonin:4,lacticidven:4)  ) Recent Results (from the past 240 hour(s))  Urine culture     Status: Abnormal (Preliminary result)   Collection Time: 05/21/17 11:32 PM  Result Value Ref Range Status   Specimen Description   Final    URINE, CATHETERIZED Performed at [facility], [place] [address], [place], [place] [address]    Special Requests   Final    NONE Performed at [facility], [place] [address] [place], [place] [address]    Culture >=100,000 COLONIES/mL GRAM NEGATIVE RODS (A)  Final   Report Status PENDING  Incomplete  MRSA PCR Screening     Status: None   Collection Time: 05/22/17  3:04 PM  Result Value Ref Range Status   MRSA by PCR NEGATIVE NEGATIVE Final    Comment:        The GeneXpert MRSA Assay (FDA approved for NASAL specimens only), is one component of a comprehensive MRSA colonization surveillance program. It is not intended to diagnose MRSA infection nor to guide or monitor treatment for MRSA infections. Performed at [facility], [place] [address], [place], [place] [address]       Studies: No results found.  Scheduled Meds: . bisacodyl  10 mg Rectal Daily  . Chlorhexidine Gluconate Cloth  6 each  Topical Once  . [START ON 05/24/2017] enoxaparin (LOVENOX) injection  40 mg Subcutaneous Q24H  . levothyroxine  44 mcg Intravenous Daily  . lip balm  1 application Topical BID  . metoprolol tartrate  5 mg Intravenous Q8H    Continuous Infusions: . sodium chloride 125 mL/hr at 05/23/17 1500  . cefTRIAXone (ROCEPHIN)  IV Stopped (05/22/17 2115)  . famotidine (PEPCID) IV Stopped (05/23/17 1023)  . lactated ringers    . methocarbamol (ROBAXIN)  IV    . ondansetron (ZOFRAN) IV       LOS: 1 day     Alma Friendly, MD [facility]   If 7PM-7AM, please contact night-coverage [email or web address] Password [facility] 05/23/2017, 4:26 PM

## 2017-05-23 NOTE — Progress Notes (Addendum)
[facility] Progress Note  1 Day Post-Op  Subjective: CC: no complaints Patient resting this AM, easily arousable. She reports that her abdomen does not hurt at rest. When palpated she does say that her abdomen hurts some. She tells me that she feels like she has to urinate this AM, reminded patient that she has a catheter.  Patient noted to be in A. Fib this AM.  Objective: Vital signs in last 24 hours: Temp:  [97.9 F (36.6 C)-99 F (37.2 C)] 98.8 F (37.1 C) (05/17 0354) Resp:  [13-27] 18 (05/17 0600) BP: (144-189)/(36-104) 147/69 (05/17 0600) SpO2:  [97 %-99 %] 97 % (05/17 0600)    Intake/Output from previous day: 05/16 0701 - 05/17 0700 In: 3116.7 [I.V.:2966.7; IV Piggyback:150] Out: 1935 [Urine:1385; Emesis/NG output:550] Intake/Output this shift: No intake/output data recorded.  PE: Gen:  resting, NAD, pleasant Card:  Irregularly irregular rate and rhythm, pedal pulses 2+ BL Pulm:  Normal effort, clear to auscultation bilaterally Abd: Soft, appropriately tender, non-distended, bowel sounds present, no HSM, dressing C/D/I Skin: warm and dry, no rashes   Lab Results:  Recent Labs    05/22/17 0725 05/23/17 0329  WBC 8.2 7.9  HGB 9.4* 8.2*  HCT 32.2* 28.4*  PLT 345 268   BMET Recent Labs    05/22/17 0725 05/23/17 0329  NA 142 146*  K 4.7 4.0  CL 104 112*  CO2 24 23  GLUCOSE 156* 112*  BUN 35* 27*  CREATININE 1.82* 1.18*  CALCIUM 8.6* 8.1*   PT/INR No results for input(s): LABPROT, INR in the last 72 hours. CMP     Component Value Date/Time   NA 146 (H) 05/23/2017 0329   NA 139 07/22/2016 1234   K 4.0 05/23/2017 0329   K 4.3 07/22/2016 1234   CL 112 (H) 05/23/2017 0329   CO2 23 05/23/2017 0329   CO2 26 07/22/2016 1234   GLUCOSE 112 (H) 05/23/2017 0329   GLUCOSE 100 07/22/2016 1234   BUN 27 (H) 05/23/2017 0329   BUN 16.1 07/22/2016 1234   CREATININE 1.18 (H) 05/23/2017 0329   CREATININE 0.9 07/22/2016 1234   CALCIUM 8.1 (L)  05/23/2017 0329   CALCIUM 9.9 07/22/2016 1234   PROT 7.7 05/21/2017 2058   PROT 7.4 07/22/2016 1234   ALBUMIN 3.8 05/21/2017 2058   ALBUMIN 3.7 07/22/2016 1234   AST 26 05/21/2017 2058   AST 14 07/22/2016 1234   ALT 20 05/21/2017 2058   ALT 9 07/22/2016 1234   ALKPHOS 113 05/21/2017 2058   ALKPHOS 90 07/22/2016 1234   BILITOT 0.7 05/21/2017 2058   BILITOT 0.64 07/22/2016 1234   GFRNONAA 42 (L) 05/23/2017 0329   GFRAA 48 (L) 05/23/2017 0329   Lipase     Component Value Date/Time   LIPASE 32 05/21/2017 2058       Studies/Results: Ct Abdomen Pelvis Wo Contrast  Result Date: 05/22/2017 CLINICAL DATA:  Acute onset of nausea and vomiting. Generalized abdominal distention. Leukocytosis. EXAM: CT ABDOMEN AND PELVIS WITHOUT CONTRAST TECHNIQUE: Multidetector CT imaging of the abdomen and pelvis was performed following the standard protocol without IV contrast. COMPARISON:  CT of the abdomen and pelvis from 03/12/2015 FINDINGS: Lower chest: Patchy bibasilar airspace opacities may reflect atelectasis or possibly mild infection. The visualized portions of the mediastinum are grossly unremarkable. Hepatobiliary: The liver is unremarkable in appearance. The patient is status post cholecystectomy, with clips noted at the gallbladder fossa. The common bile duct remains normal in caliber. Pancreas: The pancreas is  within normal limits. Spleen: The spleen is unremarkable in appearance. Adrenals/Urinary Tract: The adrenal glands are grossly unremarkable in appearance. Bilateral renal stones measure up to 1.8 cm in size. There is a large 1.5 x 1.0 cm stone at the right renal pelvis, just above the right ureteropelvic junction. No significant hydronephrosis is seen. Bilateral renal parapelvic cysts are noted. No perinephric stranding is appreciated. Stomach/Bowel: There is dilatation of small-bowel loops to 3.7 cm in maximal diameter, with trace associated free fluid and mild mesenteric edema. There appears  to be a transition point at the level of a moderate periumbilical hernia, which contains a loop of mid ileum. There is no definite evidence of strangulation. The colon is largely decompressed. Scattered diverticulosis is noted along the sigmoid colon, without evidence of diverticulitis. The stomach is largely filled with fluid and air, and is unremarkable in appearance. Vascular/Lymphatic: Diffuse calcification is seen along the abdominal aorta and its branches. The abdominal aorta is otherwise grossly unremarkable. The inferior vena cava is grossly unremarkable. No retroperitoneal lymphadenopathy is seen. No pelvic sidewall lymphadenopathy is identified. Reproductive: The bladder is mildly distended and grossly unremarkable. The uterus is unremarkable in appearance. No suspicious adnexal masses are seen. The ovaries are grossly symmetric. Other: No additional soft tissue abnormalities are seen. Musculoskeletal: No acute osseous abnormalities are identified. The patient is status post decompression at the lower lumbar spine. The visualized musculature is unremarkable in appearance. IMPRESSION: 1. Small-bowel obstruction due to herniation of the mid ileum at a moderate periumbilical hernia, with dilatation of small-bowel loops to 3.7 cm in maximal diameter, trace free fluid and mild mesenteric edema. No definite evidence of strangulation at this time. 2. Patchy bibasilar airspace opacities may reflect atelectasis or possibly mild infection. 3. Bilateral renal stones measure up to 1.8 cm in size. Large 1.5 x 1.0 cm stone at the right renal pelvis, just above the right ureteropelvic junction. No significant hydronephrosis seen. 4. Bilateral renal parapelvic cysts noted. 5. Scattered diverticulosis along the sigmoid colon, without evidence of diverticulitis. Aortic Atherosclerosis (ICD10-I70.0). Electronically Signed   By: Garald Balding M.D.   On: 05/22/2017 00:57    Anti-infectives: Anti-infectives (From  admission, onward)   Start     Dose/Rate Route Frequency Ordered Stop   05/22/17 2200  cefTRIAXone (ROCEPHIN) 1 g in sodium chloride 0.9 % 100 mL IVPB     1 g 200 mL/hr over 30 Minutes Intravenous Every 24 hours 05/22/17 0139     05/22/17 0600  cefoTEtan (CEFOTAN) 2 g in sodium chloride 0.9 % 100 mL IVPB     2 g 200 mL/hr over 30 Minutes Intravenous On call to O.R. 05/22/17 0309 05/22/17 0442   05/22/17 0441  cefoTEtan in Dextrose 5% (CEFOTAN) 2-2.08 GM-%(50ML) IVPB    Note to Pharmacy:  Virgina Organ   : cabinet override      05/22/17 0441 05/22/17 1644   05/22/17 0130  azithromycin (ZITHROMAX) 500 mg in sodium chloride 0.9 % 250 mL IVPB     500 mg 250 mL/hr over 60 Minutes Intravenous  Once 05/22/17 0122 05/22/17 0315   05/21/17 2345  cefTRIAXone (ROCEPHIN) 1 g in sodium chloride 0.9 % 100 mL IVPB     1 g 200 mL/hr over 30 Minutes Intravenous  Once 05/21/17 2332 05/22/17 0117   05/21/17 0000  cephALEXin (KEFLEX) 500 MG capsule     500 mg Oral 4 times daily 05/21/17 2349         Assessment/Plan HTN - IV lopressor  scheduled HLD Hypothyroidism - IV levothyroxine IBS/Crohn's Disease Hx of bipolar affective disorder with hx of psychosis Dementia RLS UTI AKI - Cr 1.18, improving Hx of Atrial flutter/A. Fib - appears she is on xarelto at home although this was not on her home med list, will consult pharmacy for heparin gtt  Incarcerated ventral hernia S/p exploratory laparotomy, partial omentectomy, primary repair of ventral hernia, small bowel resection x 1 - 05/22/17 Dr. Johney Maine  - POD#1  - NGT with 550 cc out - continue NGT on LIWS  - PRN 12.5 mg fentanyl for pain if needed, may also try rectal tylenol first - hold other PO medications for now, may resume when able to tolerate PO in a few days - patient with good BS this AM, continue NPO for now and will try PO in 1-2 days to allow anastomotic site time to heal - continue foley for now, PT eval if patient able to mobilize well  can d/c foley from a surgery standpoint ABL anemia - Hgb 8.2 from 9.4, VSS - continue to monitor  FEN: NPO, IVF; NGT to LIWS VTE: SCDs, heparin per pharmacy ID: cefotetan periop, azithromycin 5/16, rocephin 5/15>>   LOS: 1 day   Brigid Re , [facility] Surgery 05/23/2017, 7:41 AM Pager: [phone] Consults: [phone]  Agree with above. She is in the ICU. Early post op SBO/bowel resection - continue NGT.  Will plan to remove with signs of bowel function.  Alphonsa Overall, MD, [facility] Surgery Pager: [phone] Office phone:  [phone]

## 2017-05-23 NOTE — Progress Notes (Signed)
Pt's left eye is not able to close completely and if getting red and dried out. Eye's cleaned with wet, warm wash cloth.

## 2017-05-23 NOTE — Progress Notes (Addendum)
ANTICOAGULATION CONSULT NOTE - Initial Consult  Pharmacy Consult for heparin Indication: atrial fibrillation  Allergies  Allergen Reactions  . Prednisone Other (See Comments)    Patient had psychiatric episode with hallucinations. Caused 3.5 week long hospitalization & loss of memory   . Carbamazepine Nausea Only and Other (See Comments)    Tegretol; dizziness, blurred vision, nausea, headache, insomnia  . Codeine Nausea And Vomiting  . Demerol [Meperidine] Nausea And Vomiting  . Diclofenac Sodium Other (See Comments)    severe headache  . Fentanyl Nausea And Vomiting and Other (See Comments)    IV Fentanyl; caused nausea and vomiting Fentanyl patch; caused chest pain, HBP, with second patch experienced vertigo and nausea  . Fluoxetine Hcl Nausea Only and Other (See Comments)    dizziness, blurred vision, nausea, headache, insomnia  . Gentamicin     Other reaction(s): EYE IRRITATION  . Hydromorphone Hcl Nausea And Vomiting  . Ibuprofen Other (See Comments)    Caused ulcers and colitis  . Indomethacin Nausea Only and Other (See Comments)    dizziness, blurred vision, nausea, headache, insomnia  . Lisinopril Cough  . Meperidine Hcl Nausea And Vomiting  . Morphine Nausea And Vomiting  . Pregabalin Other (See Comments)    severe restless legs, insomnia  . Propranolol Hcl Nausea Only and Other (See Comments)    dizziness, blurred vision, nausea, headache, insomnia  . Refresh Lacri-Lube [Eye Lubricant] Swelling    Caused swelling, redness, hard crust on eyelids   . Ropinirole Hcl Other (See Comments)    severe restless legs and insomnia  . Sulfamethoxazole Nausea Only and Other (See Comments)    gas, loose stools  . White Petrolatum-Mineral Oil  [Aquaphilic]     Other reaction(s): SWELLING/EDEMA  . Bacitracin Hives, Itching and Rash    redness    Patient Measurements: Weight: 182 lb 15.7 oz (83 kg) Heparin Dosing Weight: 73  Vital Signs: Temp: 99.5 F (37.5 C) (05/17  0800) Temp Source: Axillary (05/17 0800) BP: 147/69 (05/17 0600)  Labs: Recent Labs    05/21/17 2058 05/22/17 0725 05/23/17 0329  HGB 10.9* 9.4* 8.2*  HCT 36.4 32.2* 28.4*  PLT 425* 345 268  CREATININE 1.66* 1.82* 1.18*    Estimated Creatinine Clearance: 38.3 mL/min (A) (by C-G formula based on SCr of 1.18 mg/dL (H)).   Medical History: Past Medical History:  Diagnosis Date  . Bipolar affective ([place])    History of psychosis  . C. difficile colitis 11/26/2014  . C. difficile diarrhea   . CAD (coronary artery disease)    LAD 30% 2006  . Cerebrovascular disease   . Colonic polyp   . Crohn's disease ([place])   . Diverticulosis of colon   . Fibromyalgia   . GERD (gastroesophageal reflux disease)   . History of atrial flutter   . History of bronchitis   . History of headache   . History of hyperparathyroidism   . History of tuberculosis   . Hyperlipemia   . Hypertension   . Hypothyroidism   . IBS (irritable bowel syndrome)   . Memory disturbance   . 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 Obesity   . OSA (obstructive sleep apnea)    denies 12/07/15  . Osteopenia   . Other diseases of lung, not elsewhere classified   . Psychosis ([place])   . Radiation 05/2008   5000 cGy to left lower eyelid, parotid lymphatics and upper neck  . Restless legs syndrome (RLS)   .  Syncope   . Tuberculosis     Medications:  Medications Prior to Admission  Medication Sig Dispense Refill Last Dose  . amLODipine (NORVASC) 5 MG tablet TAKE 1 TABLET (5 MG TOTAL) BY MOUTH DAILY. 90 tablet 2 05/21/2017 at 0900  . anastrozole (ARIMIDEX) 1 MG tablet Take 1 tablet (1 mg total) by mouth daily. 90 tablet 3 05/21/2017 at 0900  . aspirin 81 MG EC tablet Take 81 mg by mouth.   05/21/2017 at 0900  . atorvastatin (LIPITOR) 40 MG tablet TAKE 1 TABLET DAILY 90 tablet 1 05/20/2017 at 2000  . buPROPion (WELLBUTRIN XL) 150 MG 24 hr tablet Take 150 mg by mouth daily.   05/21/2017 at 0800  . dicyclomine (BENTYL)  10 MG capsule Take 1 capsule (10 mg total) by mouth 3 (three) times daily before meals. (Patient taking differently: Take 10 mg by mouth 2 (two) times daily. ) 20 capsule 0 05/21/2017 at 1400  . donepezil (ARICEPT) 5 MG tablet Take 5 mg by mouth at bedtime.   05/20/2017 at 2000  . levothyroxine (SYNTHROID, LEVOTHROID) 88 MCG tablet Take 1 tablet (88 mcg total) by mouth daily before breakfast. Pt needs an appointment for further refills 30 tablet 2 05/21/2017 at 0700  . memantine (NAMENDA) 10 MG tablet TAKE 1 TABLET (10 MG TOTAL) BY MOUTH TWO  TIMES DAILY. 180 tablet 5 05/21/2017 at 0900  . Multiple Vitamin (MULTIVITAMIN WITH MINERALS) TABS tablet Take 1 tablet by mouth daily.   05/21/2017 at 0900  . Propylene Glycol (SYSTANE BALANCE) 0.6 % SOLN Place 1 drop into both eyes daily.   05/21/2017 at 0800  . risperiDONE (RISPERDAL) 1 MG tablet Take 0.5 tablets (0.5 mg total) at bedtime by mouth. 45 tablet 1 05/20/2017 at 2000    Assessment: 84 yo s/p OR 5/16 for incarcerated ventral hernia.  Pt with Afib w/ hx of taking xarelto but not taking xarelto just PTA per [facility].  I called [facility] RN did not know why she was taken off xarelto PTA. Per [facility] MAR xarelto was dc'd on 05/07/17.   Wt 83 kg, Hg 8.1 post op.    Goal of Therapy:  Heparin level 0.3-0.7 units/ml Monitor platelets by anticoagulation protocol: Yes   Plan:  Baseline aPTT and Heparin level due to uncertain hx of xarelto use No bolus per MD request Heparin drip at 1100 units/hr Check 8 hr HL and aPTT  Leodis Sias T 05/23/2017,8:50 AM

## 2017-05-24 LAB — CBC WITH DIFFERENTIAL/PLATELET
BASOS ABS: 0 10*3/uL (ref 0.0–0.1)
BASOS PCT: 0 %
EOS ABS: 0 10*3/uL (ref 0.0–0.7)
Eosinophils Relative: 1 %
HCT: 28.3 % — ABNORMAL LOW (ref 36.0–46.0)
Hemoglobin: 8.2 g/dL — ABNORMAL LOW (ref 12.0–15.0)
Lymphocytes Relative: 18 %
Lymphs Abs: 1.1 10*3/uL (ref 0.7–4.0)
MCH: 23.4 pg — ABNORMAL LOW (ref 26.0–34.0)
MCHC: 29 g/dL — AB (ref 30.0–36.0)
MCV: 80.9 fL (ref 78.0–100.0)
MONO ABS: 0.7 10*3/uL (ref 0.1–1.0)
MONOS PCT: 11 %
Neutro Abs: 4.6 10*3/uL (ref 1.7–7.7)
Neutrophils Relative %: 70 %
Platelets: 227 10*3/uL (ref 150–400)
RBC: 3.5 MIL/uL — ABNORMAL LOW (ref 3.87–5.11)
RDW: 17.1 % — AB (ref 11.5–15.5)
WBC: 6.5 10*3/uL (ref 4.0–10.5)

## 2017-05-24 LAB — BASIC METABOLIC PANEL
Anion gap: 13 (ref 5–15)
BUN: 19 mg/dL (ref 6–20)
CALCIUM: 8.2 mg/dL — AB (ref 8.9–10.3)
CO2: 22 mmol/L (ref 22–32)
Chloride: 112 mmol/L — ABNORMAL HIGH (ref 101–111)
Creatinine, Ser: 0.81 mg/dL (ref 0.44–1.00)
GFR calc Af Amer: >60 mL/min (ref >60)
GLUCOSE: 92 mg/dL (ref 65–99)
Potassium: 3.7 mmol/L (ref 3.5–5.1)
Sodium: 147 mmol/L — ABNORMAL HIGH (ref 135–145)

## 2017-05-24 MED ORDER — DEXTROSE-NACL 5-0.45 % IV SOLN
INTRAVENOUS | Status: DC
  Administered 2017-05-24 (×2): via INTRAVENOUS

## 2017-05-24 NOTE — Progress Notes (Signed)
 PROGRESS NOTE  [name] MRN:[id number] DOB: 08/17/1932 DOA: 05/21/2017 PCP: Burchette, Bruce W, MD  HPI/Recap of past 24 hours: HPI from Dr Gardner on 05/22/17 [name] is a 84 y.o. female with medical history significant of Advanced dementia, HTN, Hypothyroidism, HLD, bipolar, presents to the ED with c/o abd pain, N/V.  Symptoms ongoing for the past 24 hours.  Cannot keep anything down by mouth. Guardian service was concerned and told caregiver to bring patient to ED. In the ED, found to have UTI, but more significantly found to have high grade SBO secondary to incarcerated umbilical hernia.  Gen surg was consulted, Dr. Gross who performed emergent surgery on 05/22/17.  Today, met pt sleeping, denies any new complaints. Has abdominal pain on palpation. Remains NPO. Noted to have 2 sec pause, asymptomatic, will monitor closely.   Assessment/Plan: Principal Problem:   Incarcerated ventral hernia s/p SB resection & primary hernia repair 05/22/2017 Active Problems:   OBESITY   OBSTRUCTIVE SLEEP APNEA   Essential hypertension   GASTROESOPHAGEAL REFLUX DISEASE   Memory impairment   Dementia with behavioral disturbance   AKI (acute kidney injury) (HCC)   Acute lower UTI   SBO (small bowel obstruction) s/p SB resection 05/22/2017   Ventral hernia with bowel obstruction   Bipolar affective (HCC)  Incarcerated ventral hernia/ Hx of crohns dsz Status post ex lap, partial omentectomy, primary repair of ventral hernia, small bowel resection x1 on 05/22/2017 by Dr. Gross Hemodynamically stable Currently n.p.o., continue IV fluids NGT to LIWS Management primarily by surgery (pain meds, dvt)  AKI Resolved Likely prerenal due to above Continue IV fluids Daily BMP  UTI UA with large leukocytes, few bacteria,> 50 WBC clumps UC showed >100,000 E.Coli and >100,000 proteus mirabilis Continue IV Rocephin  Acute blood loss anemia Likely from post op Baseline around 10 Daily  CBC  Hypertension Stable Held home p.o. meds Continue IV Lopressor scheduled and as needed  Hx of atrial flutter Rate controlled, fluctuating in and out Anticoagulation was recently held/stopped at ALF on 05/07/17 unsure if this was temp Awaiting POA to confirm, if pt actually taking AC or not   Hypothyroidism Continue IV Synthroid  Bipolar Held home risperidone, bupropion May restart this soon as possible to avoid withdrawal  Dementia Held home donepezil, Namenda     Code Status: Full  Family Communication: None at bedside  Disposition Plan: SNF   Consultants:  General surgery  Procedures:  Ex lap on 05/22/2017  Antimicrobials:  IV Rocephin  DVT prophylaxis: Lovenox   Objective: Vitals:   05/24/17 1200 05/24/17 1300 05/24/17 1400 05/24/17 1500  BP: (!) 157/91 (!) 175/72 (!) 142/85 (!) 159/108  Pulse:      Resp: (!) 23 17 18 18  Temp: 99.2 F (37.3 C)     TempSrc: Oral     SpO2: 98% 98% 99% 98%  Weight:        Intake/Output Summary (Last 24 hours) at 05/24/2017 1521 Last data filed at 05/24/2017 1500 Gross per 24 hour  Intake 2257.08 ml  Output 2825 ml  Net -567.92 ml   Filed Weights   05/22/17 0701  Weight: 83 kg (182 lb 15.7 oz)    Exam:   General: NAD, alert, awake, oriented to place and self  Cardiovascular: S1, S2 present  Respiratory: CTAB  Abdomen: Soft, tenderness around surgical site, nondistended, hypoactive bowel sounds, NG tube in place  Musculoskeletal: No pedal edema bilaterally  Skin: Normal  Psychiatry: Unable to assess     Data Reviewed: CBC: Recent Labs  Lab 05/21/17 2058 05/22/17 0725 05/23/17 0329 05/24/17 0325  WBC 18.6* 8.2 7.9 6.5  NEUTROABS 15.3*  --   --  4.6  HGB 10.9* 9.4* 8.2* 8.2*  HCT 36.4 32.2* 28.4* 28.3*  MCV 79.3 79.7 80.7 80.9  PLT 425* 345 268 227   Basic Metabolic Panel: Recent Labs  Lab 05/21/17 2058 05/22/17 0725 05/23/17 0329 05/24/17 0325  NA 143 142 146* 147*  K 4.7 4.7  4.0 3.7  CL 104 104 112* 112*  CO2 21* 24 23 22  GLUCOSE 164* 156* 112* 92  BUN 30* 35* 27* 19  CREATININE 1.66* 1.82* 1.18* 0.81  CALCIUM 9.6 8.6* 8.1* 8.2*   GFR: Estimated Creatinine Clearance: 55.8 mL/min (by C-G formula based on SCr of 0.81 mg/dL). Liver Function Tests: Recent Labs  Lab 05/21/17 2058  AST 26  ALT 20  ALKPHOS 113  BILITOT 0.7  PROT 7.7  ALBUMIN 3.8   Recent Labs  Lab 05/21/17 2058  LIPASE 32   No results for input(s): AMMONIA in the last 168 hours. Coagulation Profile: No results for input(s): INR, PROTIME in the last 168 hours. Cardiac Enzymes: No results for input(s): CKTOTAL, CKMB, CKMBINDEX, TROPONINI in the last 168 hours. BNP (last 3 results) No results for input(s): PROBNP in the last 8760 hours. HbA1C: No results for input(s): HGBA1C in the last 72 hours. CBG: No results for input(s): GLUCAP in the last 168 hours. Lipid Profile: No results for input(s): CHOL, HDL, LDLCALC, TRIG, CHOLHDL, LDLDIRECT in the last 72 hours. Thyroid Function Tests: No results for input(s): TSH, T4TOTAL, FREET4, T3FREE, THYROIDAB in the last 72 hours. Anemia Panel: No results for input(s): VITAMINB12, FOLATE, FERRITIN, TIBC, IRON, RETICCTPCT in the last 72 hours. Urine analysis:    Component Value Date/Time   COLORURINE AMBER (A) 05/21/2017 2253   APPEARANCEUR CLOUDY (A) 05/21/2017 2253   LABSPEC 1.024 05/21/2017 2253   PHURINE 5.0 05/21/2017 2253   GLUCOSEU NEGATIVE 05/21/2017 2253   GLUCOSEU NEGATIVE 07/22/2008 1105   HGBUR LARGE (A) 05/21/2017 2253   BILIRUBINUR NEGATIVE 05/21/2017 2253   KETONESUR 5 (A) 05/21/2017 2253   PROTEINUR 100 (A) 05/21/2017 2253   UROBILINOGEN 0.2 11/19/2014 1430   NITRITE NEGATIVE 05/21/2017 2253   LEUKOCYTESUR LARGE (A) 05/21/2017 2253   Sepsis Labs: @LABRCNTIP(procalcitonin:4,lacticidven:4)  ) Recent Results (from the past 240 hour(s))  Urine culture     Status: Abnormal (Preliminary result)   Collection Time:  05/21/17 11:32 PM  Result Value Ref Range Status   Specimen Description   Final    URINE, CATHETERIZED Performed at [facility], [address] [place], [place] [address]    Special Requests   Final    NONE Performed at  [facility], [address] [place], [place] [address]    Culture (A)  Final    >=100,000 COLONIES/mL ESCHERICHIA COLI >=100,000 COLONIES/mL PROTEUS MIRABILIS SUSCEPTIBILITIES TO FOLLOW Performed at [facility], [address] [place], [place] [address]    Report Status PENDING  Incomplete  MRSA PCR Screening     Status: None   Collection Time: 05/22/17  3:04 PM  Result Value Ref Range Status   MRSA by PCR NEGATIVE NEGATIVE Final    Comment:        The GeneXpert MRSA Assay (FDA approved for NASAL specimens only), is one component of a comprehensive MRSA colonization surveillance program. It is not intended to diagnose MRSA infection nor to guide   or monitor treatment for MRSA infections. Performed at [facility], [place] [address], [place], [place] [address]       Studies: No results found.  Scheduled Meds: . bisacodyl  10 mg Rectal Daily  . Chlorhexidine Gluconate Cloth  6 each Topical Once  . enoxaparin (LOVENOX) injection  40 mg Subcutaneous Q24H  . levothyroxine  44 mcg Intravenous Daily  . lip balm  1 application Topical BID  . metoprolol tartrate  5 mg Intravenous Q8H    Continuous Infusions: . cefTRIAXone (ROCEPHIN)  IV Stopped (05/23/17 2322)  . dextrose 5 % and 0.45% NaCl 75 mL/hr at 05/24/17 0831  . famotidine (PEPCID) IV Stopped (05/24/17 1007)  . methocarbamol (ROBAXIN)  IV    . ondansetron (ZOFRAN) IV       LOS: 2 days     Alma Friendly, MD [facility]   If 7PM-7AM, please contact night-coverage [email or web address] Password [email or web address] 05/24/2017, 3:21 PM

## 2017-05-24 NOTE — Progress Notes (Signed)
Pt having short episodes of bradycardia (not symptomatic) 45bpm after IV lopressor given.  Will monitor closely

## 2017-05-24 NOTE — Progress Notes (Signed)
2 Days Post-Op   Subjective/Chief Complaint: No overnight issues Comfortable this morning   Objective: Vital signs in last 24 hours: Temp:  [97.5 F (36.4 C)-99.6 F (37.6 C)] 97.5 F (36.4 C) (05/18 0400) Resp:  [14-25] 18 (05/18 0600) BP: (137-196)/(28-63) 196/41 (05/18 0600) SpO2:  [96 %-98 %] 98 % (05/18 0600)    Intake/Output from previous day: 05/17 0701 - 05/18 0700 In: 2923 [P.O.:60; I.V.:2713; IV Piggyback:150] Out: 1308 [Urine:1350; Emesis/NG output:475] Intake/Output this shift: No intake/output data recorded.  Exam: Awake and alert, baseline mental status Abdomen soft, dressing dry  Lab Results:  Recent Labs    05/23/17 0329 05/24/17 0325  WBC 7.9 6.5  HGB 8.2* 8.2*  HCT 28.4* 28.3*  PLT 268 227   BMET Recent Labs    05/23/17 0329 05/24/17 0325  NA 146* 147*  K 4.0 3.7  CL 112* 112*  CO2 23 22  GLUCOSE 112* 92  BUN 27* 19  CREATININE 1.18* 0.81  CALCIUM 8.1* 8.2*   PT/INR No results for input(s): LABPROT, INR in the last 72 hours. ABG No results for input(s): PHART, HCO3 in the last 72 hours.  Invalid input(s): PCO2, PO2  Studies/Results: No results found.  Anti-infectives: Anti-infectives (From admission, onward)   Start     Dose/Rate Route Frequency Ordered Stop   05/22/17 2200  cefTRIAXone (ROCEPHIN) 1 g in sodium chloride 0.9 % 100 mL IVPB     1 g 200 mL/hr over 30 Minutes Intravenous Every 24 hours 05/22/17 0139     05/22/17 0600  cefoTEtan (CEFOTAN) 2 g in sodium chloride 0.9 % 100 mL IVPB     2 g 200 mL/hr over 30 Minutes Intravenous On call to O.R. 05/22/17 0309 05/22/17 0442   05/22/17 0441  cefoTEtan in Dextrose 5% (CEFOTAN) 2-2.08 GM-%(50ML) IVPB    Note to Pharmacy:  Virgina Organ   : cabinet override      05/22/17 0441 05/22/17 1644   05/22/17 0130  azithromycin (ZITHROMAX) 500 mg in sodium chloride 0.9 % 250 mL IVPB     500 mg 250 mL/hr over 60 Minutes Intravenous  Once 05/22/17 0122 05/22/17 0315   05/21/17 2345   cefTRIAXone (ROCEPHIN) 1 g in sodium chloride 0.9 % 100 mL IVPB     1 g 200 mL/hr over 30 Minutes Intravenous  Once 05/21/17 2332 05/22/17 0117   05/21/17 0000  cephALEXin (KEFLEX) 500 MG capsule     500 mg Oral 4 times daily 05/21/17 2349        Assessment/Plan: s/p Procedure(s): EXPLORATORY LAPAROTOMY reduction repair incarcerated ventral wall hernia small bowel reseection (N/A)  Continue NG until at least tomorrow given bowel resection   LOS: 2 days    [name] A 05/24/2017

## 2017-05-24 NOTE — Progress Notes (Signed)
RN notified patients heart rate has converted into A-flutter and is sustaining in the 90's.  MD text paged, awaiting response.  Will continue to monitor.

## 2017-05-24 NOTE — Evaluation (Signed)
Physical Therapy Evaluation Patient Details Name: [name] MRN: [id number] DOB: 1932/06/30 Today's Date: 05/24/2017   History of Present Illness  84 yo female admitted with incarcerated ventral hernia. S/P SB resection and hernia repair 05/22/17. hx of dementia, bipolar, obesity, fibromyalgia    Clinical Impression  On eval, pt required Mod assist +2 for mobility. Pt followed commands well. She was able to sit EOB for at least 5 minutes with Min assist. She stood at bedside, with use of a RW for ~15-20 seconds. Deferred transfer to chair/ambulation per RN request due to HR (Afib/Aflutter). No family present during session. Recommend SNF at this time. Will follow and progress activity as tolerated.     Follow Up Recommendations SNF    Equipment Recommendations  None recommended by PT    Recommendations for Other Services       Precautions / Restrictions Precautions Precautions: Fall Precaution Comments: abdominal surgery. NG tube Restrictions Weight Bearing Restrictions: No      Mobility  Bed Mobility Overal bed mobility: Needs Assistance Bed Mobility: Supine to Sit;Sit to Supine     Supine to sit: Mod assist;+2 for safety/equipment;+2 for physical assistance Sit to supine: Mod assist;+2 for safety/equipment;+2 for physical assistance;HOB elevated   General bed mobility comments: Assist for trunk and LEs. Utilized bedpad for scooting.   Transfers Overall transfer level: Needs assistance Equipment used: Rolling walker (2 wheeled) Transfers: Sit to/from Stand Sit to Stand: Mod assist;+2 safety/equipment;+2 physical assistance         General transfer comment: Assist to rise, stabilize, control descent. Stood for ~15-20 seconds at bedside with a RW. Pt was a little shaky.   Ambulation/Gait             General Gait Details: NT-RN recommended we defer transfers and ambulation on today due to HR (Afib/A flutter)  Stairs            Wheelchair Mobility     Modified Rankin (Stroke Patients Only)       Balance Overall balance assessment: Needs assistance         Standing balance support: Bilateral upper extremity supported Standing balance-Leahy Scale: Poor                               Pertinent Vitals/Pain Pain Assessment: Faces Faces Pain Scale: Hurts little more Pain Location: abdomen Pain Descriptors / Indicators: Discomfort;Sore Pain Intervention(s): Limited activity within patient's tolerance    Home Living Family/patient expects to be discharged to:: Unsure Living Arrangements: Spouse/significant other(husband has dementia) Available Help at Discharge: Family Type of Home: Independent living facility Home Access: Level entry     Home Layout: One level Home Equipment: Walker - 4 wheels;Walker - 2 wheels      Prior Function Level of Independence: Independent with assistive device(s)               Hand Dominance        Extremity/Trunk Assessment   Upper Extremity Assessment Upper Extremity Assessment: Generalized weakness    Lower Extremity Assessment Lower Extremity Assessment: Generalized weakness    Cervical / Trunk Assessment Cervical / Trunk Assessment: Kyphotic  Communication   Communication: No difficulties  Cognition Arousal/Alertness: Awake/alert Behavior During Therapy: WFL for tasks assessed/performed Overall Cognitive Status: History of cognitive impairments - at baseline  General Comments      Exercises     Assessment/Plan    PT Assessment Patient needs continued PT services  PT Problem List Decreased strength;Decreased activity tolerance;Decreased balance;Decreased cognition;Decreased knowledge of use of DME;Decreased mobility       PT Treatment Interventions DME instruction;Gait training;Functional mobility training;Therapeutic activities;Balance training;Patient/family education;Therapeutic exercise     PT Goals (Current goals can be found in the Care Plan section)  Acute Rehab PT Goals Patient Stated Goal: none stated PT Goal Formulation: Patient unable to participate in goal setting Time For Goal Achievement: 06/07/17 Potential to Achieve Goals: Good    Frequency Min 3X/week   Barriers to discharge        Co-evaluation               AM-PAC PT "6 Clicks" Daily Activity  Outcome Measure Difficulty turning over in bed (including adjusting bedclothes, sheets and blankets)?: Unable Difficulty moving from lying on back to sitting on the side of the bed? : Unable Difficulty sitting down on and standing up from a chair with arms (e.g., wheelchair, bedside commode, etc,.)?: Unable Help needed moving to and from a bed to chair (including a wheelchair)?: A Lot Help needed walking in hospital room?: A Lot Help needed climbing 3-5 steps with a railing? : Total 6 Click Score: 8    End of Session   Activity Tolerance: Patient tolerated treatment well Patient left: in bed;with call bell/phone within reach;with bed alarm set   PT Visit Diagnosis: Muscle weakness (generalized) (M62.81);Difficulty in walking, not elsewhere classified (R26.2)    Time: 9622-2979 PT Time Calculation (min) (ACUTE ONLY): 22 min   Charges:   PT Evaluation $PT Eval Moderate Complexity: 1 Mod     PT G Codes:          Weston Anna, MPT Pager: [phone]

## 2017-05-24 NOTE — Progress Notes (Addendum)
RN notified that patient experienced a two second pause, pt. Asymptomatic throughout.  MD notified of patients change in condition, received no new orders at this time.  Will continue to monitor.

## 2017-05-25 LAB — URINE CULTURE: Culture: >=100000 — AB

## 2017-05-25 LAB — CBC WITH DIFFERENTIAL/PLATELET
Basophils Absolute: 0 10*3/uL (ref 0.0–0.1)
Basophils Relative: 0 %
EOS ABS: 0.2 10*3/uL (ref 0.0–0.7)
EOS PCT: 2 %
HCT: 30.4 % — ABNORMAL LOW (ref 36.0–46.0)
Hemoglobin: 8.8 g/dL — ABNORMAL LOW (ref 12.0–15.0)
LYMPHS ABS: 1.8 10*3/uL (ref 0.7–4.0)
LYMPHS PCT: 28 %
MCH: 23.3 pg — AB (ref 26.0–34.0)
MCHC: 28.9 g/dL — ABNORMAL LOW (ref 30.0–36.0)
MCV: 80.6 fL (ref 78.0–100.0)
MONO ABS: 0.8 10*3/uL (ref 0.1–1.0)
Monocytes Relative: 13 %
Neutro Abs: 3.7 10*3/uL (ref 1.7–7.7)
Neutrophils Relative %: 57 %
PLATELETS: 279 10*3/uL (ref 150–400)
RBC: 3.77 MIL/uL — ABNORMAL LOW (ref 3.87–5.11)
RDW: 17 % — ABNORMAL HIGH (ref 11.5–15.5)
WBC: 6.4 10*3/uL (ref 4.0–10.5)

## 2017-05-25 LAB — BASIC METABOLIC PANEL
Anion gap: 11 (ref 5–15)
BUN: 10 mg/dL (ref 6–20)
CO2: 27 mmol/L (ref 22–32)
Calcium: 8.2 mg/dL — ABNORMAL LOW (ref 8.9–10.3)
Chloride: 106 mmol/L (ref 101–111)
Creatinine, Ser: 0.68 mg/dL (ref 0.44–1.00)
GFR calc Af Amer: >60 mL/min (ref >60)
Glucose, Bld: 143 mg/dL — ABNORMAL HIGH (ref 65–99)
Potassium: 2.8 mmol/L — ABNORMAL LOW (ref 3.5–5.1)
SODIUM: 144 mmol/L (ref 135–145)

## 2017-05-25 MED ORDER — POTASSIUM CHLORIDE 10 MEQ/100ML IV SOLN
10.0000 meq | INTRAVENOUS | Status: AC
  Administered 2017-05-25 (×4): 10 meq via INTRAVENOUS
  Filled 2017-05-25 (×2): qty 100

## 2017-05-25 NOTE — Progress Notes (Signed)
PROGRESS NOTE  [name] MCN:[id number] DOB: Jun 18, 1932 DOA: 05/21/2017 PCP: [name], MD  HPI/Recap of past 24 hours: HPI from Dr [name] on 05/22/17 [name] is a 84 y.o. female with medical history significant of Advanced dementia, HTN, Hypothyroidism, HLD, bipolar, presents to the ED with c/o abd pain, N/V.  Symptoms ongoing for the past 24 hours.  Cannot keep anything down by mouth. Guardian service was concerned and told caregiver to bring patient to ED. In the ED, found to have UTI, but more significantly found to have high grade SBO secondary to incarcerated umbilical hernia.  Gen surg was consulted, Dr. Johney Maine who performed emergent surgery on 05/22/17.  Today, pt denies any new complaints. Still with some mild tenderness on palpation. Advanced diet to CLD. Noted to have intermittent pauses, asymptomatic, likely 2/2 BB use.   Assessment/Plan: Principal Problem:   Incarcerated ventral hernia s/p SB resection & primary hernia repair 05/22/2017 Active Problems:   OBESITY   OBSTRUCTIVE SLEEP APNEA   Essential hypertension   GASTROESOPHAGEAL REFLUX DISEASE   Memory impairment   Dementia with behavioral disturbance   AKI (acute kidney injury) ([place])   Acute lower UTI   SBO (small bowel obstruction) s/p SB resection 05/22/2017   Ventral hernia with bowel obstruction   Bipolar affective ([place])  Incarcerated ventral hernia/ Hx of crohns dsz Status post ex lap, partial omentectomy, primary repair of ventral hernia, small bowel resection x1 on 05/22/2017 by Dr. Johney Maine Advance to CLD, remove NGT as per Gen surg Management primarily by surgery (pain meds, dvt)  AKI Resolved Likely prerenal due to above Continue IV fluids Daily BMP  UTI UA with large leukocytes, few bacteria,> 50 WBC clumps UC showed >100,000 E.Coli and >100,000 proteus mirabilis Continue IV Rocephin  Acute blood loss anemia Likely from post op Baseline around 10 Daily CBC  Hyponatremia Replace  prn  Hypertension Stable Held home p.o. meds Continue IV Lopressor scheduled and as needed  Hx of atrial flutter Rate controlled, fluctuating in and out Anticoagulation was recently held/stopped at ALF on 05/07/17 unsure if this was temp Awaiting POA to confirm, if pt actually taking AC or not   Hypothyroidism Continue IV Synthroid  Bipolar Held home risperidone, bupropion May restart this soon as possible to avoid withdrawal  Dementia Held home donepezil, Namenda     Code Status: Full  Family Communication: None at bedside  Disposition Plan: SNF   Consultants:  General surgery  Procedures:  Ex lap on 05/22/2017  Antimicrobials:  IV Rocephin  DVT prophylaxis: Lovenox   Objective: Vitals:   05/25/17 0400 05/25/17 0648 05/25/17 0800 05/25/17 1106  BP: (!) 151/60 (!) 154/52 (!) 157/40 (!) 158/59  Pulse:      Resp: 15 20 18 17   Temp:   98.9 F (37.2 C)   TempSrc:   Oral   SpO2: 99% 99% 97% 96%  Weight:        Intake/Output Summary (Last 24 hours) at 05/25/2017 1252 Last data filed at 05/25/2017 1200 Gross per 24 hour  Intake 2350 ml  Output 1610 ml  Net 740 ml   Filed Weights   05/22/17 0701  Weight: 83 kg (182 lb 15.7 oz)    Exam:   General: NAD, alert, awake, oriented to place and self  Cardiovascular: S1, S2 present  Respiratory: CTAB  Abdomen: Soft, mild tenderness around surgical site, nondistended, +bowel sounds, NG tube in place  Musculoskeletal: No pedal edema bilaterally  Skin: Normal  Psychiatry: Unable to assess   Data Reviewed: CBC: Recent Labs  Lab 05/21/17 2058 05/22/17 0725 05/23/17 0329 05/24/17 0325 05/25/17 0331  WBC 18.6* 8.2 7.9 6.5 6.4  NEUTROABS 15.3*  --   --  4.6 3.7  HGB 10.9* 9.4* 8.2* 8.2* 8.8*  HCT 36.4 32.2* 28.4* 28.3* 30.4*  MCV 79.3 79.7 80.7 80.9 80.6  PLT 425* 345 268 227 267   Basic Metabolic Panel: Recent Labs  Lab 05/21/17 2058 05/22/17 0725 05/23/17 0329 05/24/17 0325  05/25/17 0331  NA 143 142 146* 147* 144  K 4.7 4.7 4.0 3.7 2.8*  CL 104 104 112* 112* 106  CO2 21* 24 23 22 27   GLUCOSE 164* 156* 112* 92 143*  BUN 30* 35* 27* 19 10  CREATININE 1.66* 1.82* 1.18* 0.81 0.68  CALCIUM 9.6 8.6* 8.1* 8.2* 8.2*   GFR: Estimated Creatinine Clearance: 56.5 mL/min (by C-G formula based on SCr of 0.68 mg/dL). Liver Function Tests: Recent Labs  Lab 05/21/17 2058  AST 26  ALT 20  ALKPHOS 113  BILITOT 0.7  PROT 7.7  ALBUMIN 3.8   Recent Labs  Lab 05/21/17 2058  LIPASE 32   No results for input(s): AMMONIA in the last 168 hours. Coagulation Profile: No results for input(s): INR, PROTIME in the last 168 hours. Cardiac Enzymes: No results for input(s): CKTOTAL, CKMB, CKMBINDEX, TROPONINI in the last 168 hours. BNP (last 3 results) No results for input(s): PROBNP in the last 8760 hours. HbA1C: No results for input(s): HGBA1C in the last 72 hours. CBG: No results for input(s): GLUCAP in the last 168 hours. Lipid Profile: No results for input(s): CHOL, HDL, LDLCALC, TRIG, CHOLHDL, LDLDIRECT in the last 72 hours. Thyroid Function Tests: No results for input(s): TSH, T4TOTAL, FREET4, T3FREE, THYROIDAB in the last 72 hours. Anemia Panel: No results for input(s): VITAMINB12, FOLATE, FERRITIN, TIBC, IRON, RETICCTPCT in the last 72 hours. Urine analysis:    Component Value Date/Time   COLORURINE AMBER (A) 05/21/2017 2253   APPEARANCEUR CLOUDY (A) 05/21/2017 2253   LABSPEC 1.024 05/21/2017 2253   PHURINE 5.0 05/21/2017 2253   GLUCOSEU NEGATIVE 05/21/2017 2253   GLUCOSEU NEGATIVE 07/22/2008 1105   HGBUR LARGE (A) 05/21/2017 2253   BILIRUBINUR NEGATIVE 05/21/2017 2253   KETONESUR 5 (A) 05/21/2017 2253   PROTEINUR 100 (A) 05/21/2017 2253   UROBILINOGEN 0.2 11/19/2014 1430   NITRITE NEGATIVE 05/21/2017 2253   LEUKOCYTESUR LARGE (A) 05/21/2017 2253   Sepsis Labs: @LABRCNTIP (procalcitonin:4,lacticidven:4)  ) Recent Results (from the past 240  hour(s))  Urine culture     Status: Abnormal   Collection Time: 05/21/17 11:32 PM  Result Value Ref Range Status   Specimen Description   Final    URINE, CATHETERIZED Performed at [facility], [place] [address], [place], [place] [address]    Special Requests   Final    NONE Performed at [facility], [place] [address], [place], [place] [address]    Culture (A)  Final    >=100,000 COLONIES/mL ESCHERICHIA COLI >=100,000 COLONIES/mL PROTEUS MIRABILIS    Report Status 05/25/2017 FINAL  Final   Organism ID, Bacteria ESCHERICHIA COLI (A)  Final   Organism ID, Bacteria PROTEUS MIRABILIS (A)  Final      Susceptibility   Escherichia coli - MIC*    AMPICILLIN 8 SENSITIVE Sensitive     CEFAZOLIN <=4 SENSITIVE Sensitive     CEFTRIAXONE <=1 SENSITIVE Sensitive     CIPROFLOXACIN >=4 RESISTANT Resistant  GENTAMICIN <=1 SENSITIVE Sensitive     IMIPENEM <=0.25 SENSITIVE Sensitive     NITROFURANTOIN <=16 SENSITIVE Sensitive     TRIMETH/SULFA <=20 SENSITIVE Sensitive     AMPICILLIN/SULBACTAM 4 SENSITIVE Sensitive     PIP/TAZO <=4 SENSITIVE Sensitive     * >=100,000 COLONIES/mL ESCHERICHIA COLI   Proteus mirabilis - MIC*    AMPICILLIN <=2 SENSITIVE Sensitive     CEFAZOLIN <=4 SENSITIVE Sensitive     CEFTRIAXONE <=1 SENSITIVE Sensitive     CIPROFLOXACIN <=0.25 SENSITIVE Sensitive     GENTAMICIN <=1 SENSITIVE Sensitive     IMIPENEM 1 SENSITIVE Sensitive     NITROFURANTOIN 128 RESISTANT Resistant     TRIMETH/SULFA <=20 SENSITIVE Sensitive     AMPICILLIN/SULBACTAM <=2 SENSITIVE Sensitive     PIP/TAZO <=4 SENSITIVE Sensitive     * >=100,000 COLONIES/mL PROTEUS MIRABILIS  MRSA PCR Screening     Status: None   Collection Time: 05/22/17  3:04 PM  Result Value Ref Range Status   MRSA by PCR NEGATIVE NEGATIVE Final    Comment:        The GeneXpert MRSA Assay (FDA approved for NASAL specimens only), is one component of a comprehensive MRSA  colonization surveillance program. It is not intended to diagnose MRSA infection nor to guide or monitor treatment for MRSA infections. Performed at [facility], [place] [address], [place], [place] [address]       Studies: No results found.  Scheduled Meds: . bisacodyl  10 mg Rectal Daily  . Chlorhexidine Gluconate Cloth  6 each Topical Once  . enoxaparin (LOVENOX) injection  40 mg Subcutaneous Q24H  . levothyroxine  44 mcg Intravenous Daily  . lip balm  1 application Topical BID  . metoprolol tartrate  5 mg Intravenous Q8H    Continuous Infusions: . cefTRIAXone (ROCEPHIN)  IV Stopped (05/24/17 2220)  . dextrose 5 % and 0.45% NaCl 75 mL/hr at 05/24/17 2150  . famotidine (PEPCID) IV Stopped (05/25/17 1012)  . methocarbamol (ROBAXIN)  IV    . ondansetron (ZOFRAN) IV       LOS: 3 days     Alma Friendly, MD [facility]   If 7PM-7AM, please contact night-coverage [email or web address] Password [email or web address] 05/25/2017, 12:52 PM

## 2017-05-25 NOTE — Progress Notes (Signed)
Pt bradycardic for short period of time after metoprolol IV med.  (HR: 39- 45 BPM). Will continue to monitor.

## 2017-05-25 NOTE — Progress Notes (Signed)
3 Days Post-Op   Subjective/Chief Complaint: Comfortable No issues overnight   Objective: Vital signs in last 24 hours: Temp:  [97.9 F (36.6 C)-99.3 F (37.4 C)] 97.9 F (36.6 C) (05/19 0000) Resp:  [12-23] 20 (05/19 0648) BP: (142-182)/(23-108) 154/52 (05/19 0648) SpO2:  [95 %-99 %] 99 % (05/19 0648)    Intake/Output from previous day: 05/18 0701 - 05/19 0700 In: 761.3 [I.V.:711.3; IV Piggyback:50] Out: 2200 [Urine:1800; Emesis/NG output:400] Intake/Output this shift: No intake/output data recorded.  Exam: Awake and alert Abdomen soft, dressing dry  Lab Results:  Recent Labs    05/24/17 0325 05/25/17 0331  WBC 6.5 6.4  HGB 8.2* 8.8*  HCT 28.3* 30.4*  PLT 227 279   BMET Recent Labs    05/24/17 0325 05/25/17 0331  NA 147* 144  K 3.7 2.8*  CL 112* 106  CO2 22 27  GLUCOSE 92 143*  BUN 19 10  CREATININE 0.81 0.68  CALCIUM 8.2* 8.2*   PT/INR No results for input(s): LABPROT, INR in the last 72 hours. ABG No results for input(s): PHART, HCO3 in the last 72 hours.  Invalid input(s): PCO2, PO2  Studies/Results: No results found.  Anti-infectives: Anti-infectives (From admission, onward)   Start     Dose/Rate Route Frequency Ordered Stop   05/22/17 2200  cefTRIAXone (ROCEPHIN) 1 g in sodium chloride 0.9 % 100 mL IVPB     1 g 200 mL/hr over 30 Minutes Intravenous Every 24 hours 05/22/17 0139     05/22/17 0600  cefoTEtan (CEFOTAN) 2 g in sodium chloride 0.9 % 100 mL IVPB     2 g 200 mL/hr over 30 Minutes Intravenous On call to O.R. 05/22/17 0309 05/22/17 0442   05/22/17 0441  cefoTEtan in Dextrose 5% (CEFOTAN) 2-2.08 GM-%(50ML) IVPB    Note to Pharmacy:  Virgina Organ   : cabinet override      05/22/17 0441 05/22/17 1644   05/22/17 0130  azithromycin (ZITHROMAX) 500 mg in sodium chloride 0.9 % 250 mL IVPB     500 mg 250 mL/hr over 60 Minutes Intravenous  Once 05/22/17 0122 05/22/17 0315   05/21/17 2345  cefTRIAXone (ROCEPHIN) 1 g in sodium chloride  0.9 % 100 mL IVPB     1 g 200 mL/hr over 30 Minutes Intravenous  Once 05/21/17 2332 05/22/17 0117   05/21/17 0000  cephALEXin (KEFLEX) 500 MG capsule     500 mg Oral 4 times daily 05/21/17 2349        Assessment/Plan: s/p Procedure(s): EXPLORATORY LAPAROTOMY reduction repair incarcerated ventral wall hernia small bowel reseection (N/A)  D/c NG Start clear liquids  LOS: 3 days    [name] A 05/25/2017

## 2017-05-26 LAB — BASIC METABOLIC PANEL
Anion gap: 12 (ref 5–15)
BUN: 9 mg/dL (ref 6–20)
CO2: 25 mmol/L (ref 22–32)
Calcium: 8.2 mg/dL — ABNORMAL LOW (ref 8.9–10.3)
Chloride: 105 mmol/L (ref 101–111)
Creatinine, Ser: 0.7 mg/dL (ref 0.44–1.00)
GFR calc Af Amer: >60 mL/min (ref >60)
GFR calc non Af Amer: >60 mL/min (ref >60)
GLUCOSE: 111 mg/dL — AB (ref 65–99)
POTASSIUM: 3.3 mmol/L — AB (ref 3.5–5.1)
Sodium: 142 mmol/L (ref 135–145)

## 2017-05-26 LAB — CBC WITH DIFFERENTIAL/PLATELET
Basophils Absolute: 0 10*3/uL (ref 0.0–0.1)
Basophils Relative: 0 %
Eosinophils Absolute: 0.2 10*3/uL (ref 0.0–0.7)
Eosinophils Relative: 3 %
HEMATOCRIT: 31.2 % — AB (ref 36.0–46.0)
HEMOGLOBIN: 9 g/dL — AB (ref 12.0–15.0)
LYMPHS ABS: 2.2 10*3/uL (ref 0.7–4.0)
LYMPHS PCT: 27 %
MCH: 23.1 pg — AB (ref 26.0–34.0)
MCHC: 28.8 g/dL — ABNORMAL LOW (ref 30.0–36.0)
MCV: 80.2 fL (ref 78.0–100.0)
Monocytes Absolute: 0.8 10*3/uL (ref 0.1–1.0)
Monocytes Relative: 9 %
NEUTROS PCT: 61 %
Neutro Abs: 4.9 10*3/uL (ref 1.7–7.7)
Platelets: 219 10*3/uL (ref 150–400)
RBC: 3.89 MIL/uL (ref 3.87–5.11)
RDW: 17.1 % — ABNORMAL HIGH (ref 11.5–15.5)
WBC: 8 10*3/uL (ref 4.0–10.5)

## 2017-05-26 MED ORDER — BUPROPION HCL ER (XL) 150 MG PO TB24
150.0000 mg | ORAL_TABLET | Freq: Every day | ORAL | Status: DC
  Administered 2017-05-26 – 2017-05-29 (×4): 150 mg via ORAL
  Filled 2017-05-26 (×4): qty 1

## 2017-05-26 MED ORDER — GERHARDT'S BUTT CREAM
TOPICAL_CREAM | Freq: Three times a day (TID) | CUTANEOUS | Status: DC
  Administered 2017-05-26 – 2017-05-28 (×7): via TOPICAL
  Filled 2017-05-26: qty 1

## 2017-05-26 MED ORDER — METOPROLOL TARTRATE 5 MG/5ML IV SOLN
5.0000 mg | Freq: Four times a day (QID) | INTRAVENOUS | Status: DC | PRN
  Administered 2017-05-26 – 2017-05-27 (×3): 5 mg via INTRAVENOUS
  Filled 2017-05-26 (×3): qty 5

## 2017-05-26 MED ORDER — HYDRALAZINE HCL 20 MG/ML IJ SOLN
5.0000 mg | Freq: Once | INTRAMUSCULAR | Status: AC
  Administered 2017-05-26: 5 mg via INTRAVENOUS
  Filled 2017-05-26: qty 1

## 2017-05-26 MED ORDER — POTASSIUM CHLORIDE 10 MEQ/100ML IV SOLN
10.0000 meq | INTRAVENOUS | Status: AC
  Administered 2017-05-26 (×2): 10 meq via INTRAVENOUS
  Filled 2017-05-26 (×2): qty 100

## 2017-05-26 MED ORDER — RISPERIDONE 0.25 MG PO TABS
0.5000 mg | ORAL_TABLET | Freq: Every day | ORAL | Status: DC
  Administered 2017-05-26 – 2017-05-28 (×3): 0.5 mg via ORAL
  Filled 2017-05-26 (×3): qty 2

## 2017-05-26 MED ORDER — AMLODIPINE BESYLATE 5 MG PO TABS
5.0000 mg | ORAL_TABLET | Freq: Every day | ORAL | Status: DC
  Administered 2017-05-26 – 2017-05-29 (×4): 5 mg via ORAL
  Filled 2017-05-26 (×4): qty 1

## 2017-05-26 NOTE — Progress Notes (Signed)
PROGRESS NOTE  [name]:[id number] DOB: April 29, 1932 DOA: 05/21/2017 PCP: [name], MD  HPI/Recap of past 24 hours: HPI from Dr [name] on 05/22/17 [name] is a 84 y.o. female with medical history significant of Advanced dementia, HTN, Hypothyroidism, HLD, bipolar, presents to the ED with c/o abd pain, N/V.  Symptoms ongoing for the past 24 hours.  Cannot keep anything down by mouth. Guardian service was concerned and told caregiver to bring patient to ED. In the ED, found to have UTI, but more significantly found to have high grade SBO secondary to incarcerated umbilical hernia.  Gen surg was consulted, Dr. Johney Maine who performed emergent surgery on 05/22/17.  Today, pt denies any new complaints. Advanced diet to full liquid, will start some PO meds.   Assessment/Plan: Principal Problem:   Incarcerated ventral hernia s/p SB resection & primary hernia repair 05/22/2017 Active Problems:   OBESITY   OBSTRUCTIVE SLEEP APNEA   Essential hypertension   GASTROESOPHAGEAL REFLUX DISEASE   Memory impairment   Dementia with behavioral disturbance   AKI (acute kidney injury) ([place])   Acute lower UTI   SBO (small bowel obstruction) s/p SB resection 05/22/2017   Ventral hernia with bowel obstruction   Bipolar affective ([place])  Incarcerated ventral hernia/ Hx of crohns dsz Status post ex lap, partial omentectomy, primary repair of ventral hernia, small bowel resection x1 on 05/22/2017 by Dr. Johney Maine Advance to full liquid, remove NGT as per Gen surg Management primarily by surgery (pain meds, dvt)  AKI Resolved s/p IVF Likely prerenal due to above Daily BMP  UTI UA with large leukocytes, few bacteria,> 50 WBC clumps UC showed >100,000 E.Coli and >100,000 proteus mirabilis Complete IV Rocephin X 7 days  Acute blood loss anemia Likely from post op Baseline around 10 Daily CBC  Hypokalemia Replace prn  Hypertension Stable Restart home amlodipine Continue IV Lopressor  prn  Hx of atrial flutter Rate controlled, fluctuating in and out Anticoagulation was recently held/stopped at ALF on 05/07/17 unsure if this was temp Awaiting POA to confirm, if pt actually taking AC or not   Hypothyroidism Continue IV Synthroid  Bipolar Restart home risperidone, bupropion  Dementia Held home donepezil, Namenda     Code Status: Full  Family Communication: None at bedside  Disposition Plan: SNF   Consultants:  General surgery  Procedures:  Ex lap on 05/22/2017  Antimicrobials:  IV Rocephin  DVT prophylaxis: Lovenox   Objective: Vitals:   05/26/17 0343 05/26/17 0400 05/26/17 0800 05/26/17 1200  BP:  (!) 132/30 (!) 142/16   Pulse:      Resp:  (!) 26 19   Temp: 98.5 F (36.9 C)  98.6 F (37 C) (!) 97.5 F (36.4 C)  TempSrc: Oral  Oral Oral  SpO2:  100% 100%   Weight:        Intake/Output Summary (Last 24 hours) at 05/26/2017 1508 Last data filed at 05/26/2017 1016 Gross per 24 hour  Intake 950 ml  Output 300 ml  Net 650 ml   Filed Weights   05/22/17 0701  Weight: 83 kg (182 lb 15.7 oz)    Exam:   General: NAD, alert, awake, oriented to place and self  Cardiovascular: S1, S2 present  Respiratory: CTAB  Abdomen: Soft, non-tender, mildly distended, +bowel sounds  Musculoskeletal: No pedal edema bilaterally  Skin: Normal  Psychiatry: Unable to assess   Data Reviewed: CBC: Recent Labs  Lab 05/21/17 2058 05/22/17 0725 05/23/17 0329 05/24/17 0325 05/25/17  4097 05/26/17 0336  WBC 18.6* 8.2 7.9 6.5 6.4 8.0  NEUTROABS 15.3*  --   --  4.6 3.7 4.9  HGB 10.9* 9.4* 8.2* 8.2* 8.8* 9.0*  HCT 36.4 32.2* 28.4* 28.3* 30.4* 31.2*  MCV 79.3 79.7 80.7 80.9 80.6 80.2  PLT 425* 345 268 227 279 353   Basic Metabolic Panel: Recent Labs  Lab 05/22/17 0725 05/23/17 0329 05/24/17 0325 05/25/17 0331 05/26/17 0336  NA 142 146* 147* 144 142  K 4.7 4.0 3.7 2.8* 3.3*  CL 104 112* 112* 106 105  CO2 24 23 22 27 25   GLUCOSE 156*  112* 92 143* 111*  BUN 35* 27* 19 10 9   CREATININE 1.82* 1.18* 0.81 0.68 0.70  CALCIUM 8.6* 8.1* 8.2* 8.2* 8.2*   GFR: Estimated Creatinine Clearance: 56.5 mL/min (by C-G formula based on SCr of 0.7 mg/dL). Liver Function Tests: Recent Labs  Lab 05/21/17 2058  AST 26  ALT 20  ALKPHOS 113  BILITOT 0.7  PROT 7.7  ALBUMIN 3.8   Recent Labs  Lab 05/21/17 2058  LIPASE 32   No results for input(s): AMMONIA in the last 168 hours. Coagulation Profile: No results for input(s): INR, PROTIME in the last 168 hours. Cardiac Enzymes: No results for input(s): CKTOTAL, CKMB, CKMBINDEX, TROPONINI in the last 168 hours. BNP (last 3 results) No results for input(s): PROBNP in the last 8760 hours. HbA1C: No results for input(s): HGBA1C in the last 72 hours. CBG: No results for input(s): GLUCAP in the last 168 hours. Lipid Profile: No results for input(s): CHOL, HDL, LDLCALC, TRIG, CHOLHDL, LDLDIRECT in the last 72 hours. Thyroid Function Tests: No results for input(s): TSH, T4TOTAL, FREET4, T3FREE, THYROIDAB in the last 72 hours. Anemia Panel: No results for input(s): VITAMINB12, FOLATE, FERRITIN, TIBC, IRON, RETICCTPCT in the last 72 hours. Urine analysis:    Component Value Date/Time   COLORURINE AMBER (A) 05/21/2017 2253   APPEARANCEUR CLOUDY (A) 05/21/2017 2253   LABSPEC 1.024 05/21/2017 2253   PHURINE 5.0 05/21/2017 2253   GLUCOSEU NEGATIVE 05/21/2017 2253   GLUCOSEU NEGATIVE 07/22/2008 1105   HGBUR LARGE (A) 05/21/2017 2253   BILIRUBINUR NEGATIVE 05/21/2017 2253   KETONESUR 5 (A) 05/21/2017 2253   PROTEINUR 100 (A) 05/21/2017 2253   UROBILINOGEN 0.2 11/19/2014 1430   NITRITE NEGATIVE 05/21/2017 2253   LEUKOCYTESUR LARGE (A) 05/21/2017 2253   Sepsis Labs: @LABRCNTIP (procalcitonin:4,lacticidven:4)  ) Recent Results (from the past 240 hour(s))  Urine culture     Status: Abnormal   Collection Time: 05/21/17 11:32 PM  Result Value Ref Range Status   Specimen Description    Final    URINE, CATHETERIZED Performed at [facility], [place] [address] [place], [place] [address]    Special Requests   Final    NONE Performed at [facility], [place] [address], [place], [place] [address]    Culture (A)  Final    >=100,000 COLONIES/mL ESCHERICHIA COLI >=100,000 COLONIES/mL PROTEUS MIRABILIS    Report Status 05/25/2017 FINAL  Final   Organism ID, Bacteria ESCHERICHIA COLI (A)  Final   Organism ID, Bacteria PROTEUS MIRABILIS (A)  Final      Susceptibility   Escherichia coli - MIC*    AMPICILLIN 8 SENSITIVE Sensitive     CEFAZOLIN <=4 SENSITIVE Sensitive     CEFTRIAXONE <=1 SENSITIVE Sensitive     CIPROFLOXACIN >=4 RESISTANT Resistant     GENTAMICIN <=1 SENSITIVE Sensitive     IMIPENEM <=0.25 SENSITIVE Sensitive  NITROFURANTOIN <=16 SENSITIVE Sensitive     TRIMETH/SULFA <=20 SENSITIVE Sensitive     AMPICILLIN/SULBACTAM 4 SENSITIVE Sensitive     PIP/TAZO <=4 SENSITIVE Sensitive     * >=100,000 COLONIES/mL ESCHERICHIA COLI   Proteus mirabilis - MIC*    AMPICILLIN <=2 SENSITIVE Sensitive     CEFAZOLIN <=4 SENSITIVE Sensitive     CEFTRIAXONE <=1 SENSITIVE Sensitive     CIPROFLOXACIN <=0.25 SENSITIVE Sensitive     GENTAMICIN <=1 SENSITIVE Sensitive     IMIPENEM 1 SENSITIVE Sensitive     NITROFURANTOIN 128 RESISTANT Resistant     TRIMETH/SULFA <=20 SENSITIVE Sensitive     AMPICILLIN/SULBACTAM <=2 SENSITIVE Sensitive     PIP/TAZO <=4 SENSITIVE Sensitive     * >=100,000 COLONIES/mL PROTEUS MIRABILIS  MRSA PCR Screening     Status: None   Collection Time: 05/22/17  3:04 PM  Result Value Ref Range Status   MRSA by PCR NEGATIVE NEGATIVE Final    Comment:        The GeneXpert MRSA Assay (FDA approved for NASAL specimens only), is one component of a comprehensive MRSA colonization surveillance program. It is not intended to diagnose MRSA infection nor to guide or monitor treatment for MRSA infections. Performed at [facility], [place] [address], [place], [place] [address]       Studies: No results found.  Scheduled Meds: . amLODipine  5 mg Oral Daily  . bisacodyl  10 mg Rectal Daily  . buPROPion  150 mg Oral Daily  . Chlorhexidine Gluconate Cloth  6 each Topical Once  . enoxaparin (LOVENOX) injection  40 mg Subcutaneous Q24H  . levothyroxine  44 mcg Intravenous Daily  . lip balm  1 application Topical BID  . risperiDONE  0.5 mg Oral QHS    Continuous Infusions: . cefTRIAXone (ROCEPHIN)  IV Stopped (05/25/17 2257)  . famotidine (PEPCID) IV Stopped (05/26/17 1046)  . methocarbamol (ROBAXIN)  IV    . ondansetron (ZOFRAN) IV       LOS: 4 days     Alma Friendly, MD [facility]   If 7PM-7AM, please contact night-coverage [email or web address] Password [email or web address] 05/26/2017, 3:08 PM

## 2017-05-26 NOTE — Progress Notes (Signed)
Physical Therapy Treatment Patient Details Name: [name] MRN: [id number] DOB: Oct 05, 1932 Today's Date: 05/26/2017    History of Present Illness 84 yo female admitted with incarcerated ventral hernia. S/P SB resection and hernia repair 05/22/17. hx of dementia, bipolar, obesity, fibromyalgia    PT Comments    The patient tolerated  Standing,piviot to recliner and standing again from recliner with 2 max assist.  Incontinent of BM. RN aware. Continue PT  Follow Up Recommendations  SNF     Equipment Recommendations  None recommended by PT    Recommendations for Other Services       Precautions / Restrictions Precautions Precautions: Fall Precaution Comments: abdominal surgery.  incontinent    Mobility  Bed Mobility Overal bed mobility: Needs Assistance Bed Mobility: Supine to Sit     Supine to sit: Max assist     General bed mobility comments: patient incontinent of BM  Transfers Overall transfer level: Needs assistance Equipment used: 2 person hand held assist Transfers: Sit to/from Stand;Stand Pivot Transfers   Stand pivot transfers: Max assist;+2 physical assistance;+2 safety/equipment       General transfer comment: Assist to rise, stabilize, multimodal cus for taikng small steps to recliner. Incontinent of BM, control of descent. Stood at [employer] with 2 assist to rise for hygiene and  linen change. Stood ~ 1 minute.  Ambulation/Gait                 Stairs             Wheelchair Mobility    Modified Rankin (Stroke Patients Only)       Balance Overall balance assessment: Needs assistance Sitting-balance support: Bilateral upper extremity supported;Feet supported Sitting balance-Leahy Scale: Poor     Standing balance support: Bilateral upper extremity supported;During functional activity Standing balance-Leahy Scale: Poor                              Cognition Arousal/Alertness: Awake/alert Behavior During Therapy: Flat  affect Overall Cognitive Status: History of cognitive impairments - at baseline                                        Exercises      General Comments        Pertinent Vitals/Pain Faces Pain Scale: Hurts little more Pain Location: abdomen Pain Descriptors / Indicators: Discomfort;Sore Pain Intervention(s): Monitored during session    Home Living                      Prior Function            PT Goals (current goals can now be found in the care plan section) Progress towards PT goals: Progressing toward goals    Frequency    Min 2X/week      PT Plan Current plan remains appropriate;Frequency needs to be updated    Co-evaluation              AM-PAC PT "6 Clicks" Daily Activity  Outcome Measure  Difficulty turning over in bed (including adjusting bedclothes, sheets and blankets)?: Unable Difficulty moving from lying on back to sitting on the side of the bed? : Unable Difficulty sitting down on and standing up from a chair with arms (e.g., wheelchair, bedside commode, etc,.)?: Unable Help needed moving to and from a bed to  chair (including a wheelchair)?: Total Help needed walking in hospital room?: Total Help needed climbing 3-5 steps with a railing? : Total 6 Click Score: 6    End of Session Equipment Utilized During Treatment: Oxygen;Gait belt Activity Tolerance: Patient tolerated treatment well Patient left: in chair;with call bell/phone within reach Nurse Communication: Mobility status PT Visit Diagnosis: Muscle weakness (generalized) (M62.81);Difficulty in walking, not elsewhere classified (R26.2)     Time: 0335-3317 PT Time Calculation (min) (ACUTE ONLY): 31 min  Charges:  $Therapeutic Activity: 23-37 mins                    G CodesTresa Endo PT [phone]    Claretha Cooper 05/26/2017, 1:53 PM

## 2017-05-26 NOTE — Progress Notes (Addendum)
[facility] Progress Note  4 Days Post-Op  Subjective: CC:  Resting comfortably. No complaints this AM. No emesis overnight. Denies abdominal pain or soreness. [place] external cath in place with good UOP. Unable to accurately report BMs/flatus given advanced dementia. No BMs documented in epic. Entire clear liquid tray is at bedside.    Objective: Vital signs in last 24 hours: Temp:  [98.5 F (36.9 C)-99.3 F (37.4 C)] 98.5 F (36.9 C) (05/20 0343) Resp:  [15-26] 26 (05/20 0400) BP: (132-169)/(25-65) 132/30 (05/20 0400) SpO2:  [96 %-100 %] 100 % (05/20 0400) Last BM Date: 05/24/17  Intake/Output from previous day: 05/19 0701 - 05/20 0700 In: 1861.3 [I.V.:1311.3; IV Piggyback:550] Out: 710 [Urine:680; Emesis/NG output:30] Intake/Output this shift: No intake/output data recorded.  PE: Gen:  Alert, NAD, comfortable Card:  Regular rate and rhythm 73 bpm during my exam, pedal pulses 2+ BL Pulm:  Normal effort Abd: Soft, non-tender, mild distention, bowel sounds present, abdominal incision C/D/I Skin: warm and dry, no rashes  Psych: A&Ox3   Lab Results:  Recent Labs    05/25/17 0331 05/26/17 0336  WBC 6.4 8.0  HGB 8.8* 9.0*  HCT 30.4* 31.2*  PLT 279 219   BMET Recent Labs    05/25/17 0331 05/26/17 0336  NA 144 142  K 2.8* 3.3*  CL 106 105  CO2 27 25  GLUCOSE 143* 111*  BUN 10 9  CREATININE 0.68 0.70  CALCIUM 8.2* 8.2*   PT/INR No results for input(s): LABPROT, INR in the last 72 hours. CMP     Component Value Date/Time   NA 142 05/26/2017 0336   NA 139 07/22/2016 1234   K 3.3 (L) 05/26/2017 0336   K 4.3 07/22/2016 1234   CL 105 05/26/2017 0336   CO2 25 05/26/2017 0336   CO2 26 07/22/2016 1234   GLUCOSE 111 (H) 05/26/2017 0336   GLUCOSE 100 07/22/2016 1234   BUN 9 05/26/2017 0336   BUN 16.1 07/22/2016 1234   CREATININE 0.70 05/26/2017 0336   CREATININE 0.9 07/22/2016 1234   CALCIUM 8.2 (L) 05/26/2017 0336   CALCIUM 9.9 07/22/2016  1234   PROT 7.7 05/21/2017 2058   PROT 7.4 07/22/2016 1234   ALBUMIN 3.8 05/21/2017 2058   ALBUMIN 3.7 07/22/2016 1234   AST 26 05/21/2017 2058   AST 14 07/22/2016 1234   ALT 20 05/21/2017 2058   ALT 9 07/22/2016 1234   ALKPHOS 113 05/21/2017 2058   ALKPHOS 90 07/22/2016 1234   BILITOT 0.7 05/21/2017 2058   BILITOT 0.64 07/22/2016 1234   GFRNONAA >60 05/26/2017 0336   GFRAA >60 05/26/2017 0336   Lipase     Component Value Date/Time   LIPASE 32 05/21/2017 2058       Studies/Results: No results found.  Anti-infectives: Anti-infectives (From admission, onward)   Start     Dose/Rate Route Frequency Ordered Stop   05/22/17 2200  cefTRIAXone (ROCEPHIN) 1 g in sodium chloride 0.9 % 100 mL IVPB     1 g 200 mL/hr over 30 Minutes Intravenous Every 24 hours 05/22/17 0139     05/22/17 0600  cefoTEtan (CEFOTAN) 2 g in sodium chloride 0.9 % 100 mL IVPB     2 g 200 mL/hr over 30 Minutes Intravenous On call to O.R. 05/22/17 0309 05/22/17 0442   05/22/17 0441  cefoTEtan in Dextrose 5% (CEFOTAN) 2-2.08 GM-%(50ML) IVPB    Note to Pharmacy:  Virgina Organ   : cabinet override  05/22/17 0441 05/22/17 1644   05/22/17 0130  azithromycin (ZITHROMAX) 500 mg in sodium chloride 0.9 % 250 mL IVPB     500 mg 250 mL/hr over 60 Minutes Intravenous  Once 05/22/17 0122 05/22/17 0315   05/21/17 2345  cefTRIAXone (ROCEPHIN) 1 g in sodium chloride 0.9 % 100 mL IVPB     1 g 200 mL/hr over 30 Minutes Intravenous  Once 05/21/17 2332 05/22/17 0117   05/21/17 0000  cephALEXin (KEFLEX) 500 MG capsule     500 mg Oral 4 times daily 05/21/17 2349       Assessment/Plan UTI - Rocephin per primary HTN Hx atrial flutter Bipolar disorder dementa ABL anemia - stable   SBO 2/2 incarcerated ventral hernia  s/p EXPLORATORY LAPAROTOMY reduction repair incarcerated ventral wall hernia small bowel reseection 05/22/17 Dr. Johney Maine  - POD#4, afebrile, VSS, no leukocytosis  - tolerating clear liquids, no documented  emesis overnight, good BS. Advance to full liquids and await further bowel function. - mobilize with therapies and up to chair with nursing staff - incentive spirometry   FEN - start full liquids; hypokalemia (3.3) - replace  ID - RocephD's, Lovenox; hgb 9.0, stable in 5/16 >> (Day#4) VTE - [place] Foley - external cath  Dispo - PT/OT; SNF when medically stable    LOS: 4 days    Jill Alexanders , [facility] 05/26/2017, 7:39 AM Pager: [phone] Consults: [phone] Mon-Fri 7:00 am-4:30 pm Sat-Sun 7:00 am-11:30 am

## 2017-05-27 LAB — BASIC METABOLIC PANEL
Anion gap: 11 (ref 5–15)
BUN: 9 mg/dL (ref 6–20)
CALCIUM: 8.6 mg/dL — AB (ref 8.9–10.3)
CO2: 26 mmol/L (ref 22–32)
CREATININE: 0.7 mg/dL (ref 0.44–1.00)
Chloride: 102 mmol/L (ref 101–111)
GFR calc Af Amer: >60 mL/min (ref >60)
Glucose, Bld: 113 mg/dL — ABNORMAL HIGH (ref 65–99)
Potassium: 3.6 mmol/L (ref 3.5–5.1)
SODIUM: 139 mmol/L (ref 135–145)

## 2017-05-27 LAB — CBC WITH DIFFERENTIAL/PLATELET
BASOS ABS: 0 10*3/uL (ref 0.0–0.1)
Basophils Relative: 0 %
Eosinophils Absolute: 0.2 10*3/uL (ref 0.0–0.7)
Eosinophils Relative: 2 %
HEMATOCRIT: 30.7 % — AB (ref 36.0–46.0)
Hemoglobin: 9.3 g/dL — ABNORMAL LOW (ref 12.0–15.0)
LYMPHS PCT: 22 %
Lymphs Abs: 2 10*3/uL (ref 0.7–4.0)
MCH: 23.8 pg — ABNORMAL LOW (ref 26.0–34.0)
MCHC: 30.3 g/dL (ref 30.0–36.0)
MCV: 78.5 fL (ref 78.0–100.0)
MONOS PCT: 9 %
Monocytes Absolute: 0.8 10*3/uL (ref 0.1–1.0)
NEUTROS ABS: 6 10*3/uL (ref 1.7–7.7)
Neutrophils Relative %: 67 %
Platelets: 291 10*3/uL (ref 150–400)
RBC: 3.91 MIL/uL (ref 3.87–5.11)
RDW: 17 % — AB (ref 11.5–15.5)
WBC: 9.1 10*3/uL (ref 4.0–10.5)

## 2017-05-27 MED ORDER — FAMOTIDINE 20 MG PO TABS
20.0000 mg | ORAL_TABLET | Freq: Every day | ORAL | Status: DC
  Administered 2017-05-28 – 2017-05-29 (×2): 20 mg via ORAL
  Filled 2017-05-27 (×2): qty 1

## 2017-05-27 MED ORDER — LEVOTHYROXINE SODIUM 88 MCG PO TABS
88.0000 ug | ORAL_TABLET | Freq: Every day | ORAL | Status: DC
  Administered 2017-05-28 – 2017-05-29 (×2): 88 ug via ORAL
  Filled 2017-05-27 (×2): qty 1

## 2017-05-27 MED ORDER — DIPHENHYDRAMINE HCL 12.5 MG/5ML PO ELIX: 12.5000 mg | ORAL_SOLUTION | Freq: Four times a day (QID) | ORAL | Status: DC | PRN

## 2017-05-27 NOTE — Progress Notes (Signed)
PROGRESS NOTE  [name] AST:[id number] DOB: 1932/06/14 DOA: 05/21/2017 PCP: [name], MD  HPI/Recap of past 24 hours: HPI from Dr [name] on 05/22/17 [name] is a 84 y.o. female with medical history significant of Advanced dementia, HTN, Hypothyroidism, HLD, bipolar, presents to the ED with c/o abd pain, N/V.  Symptoms ongoing for the past 24 hours.  Cannot keep anything down by mouth. Guardian service was concerned and told caregiver to bring patient to ED. In the ED, found to have UTI, but more significantly found to have high grade SBO secondary to incarcerated umbilical hernia.  Gen surg was consulted, Dr. Johney Maine who performed emergent surgery on 05/22/17.  Today, pt denies any new complaints. Advanced diet to soft by Gen surg. Pt had a BM, bed soiled with stool.    Assessment/Plan: Principal Problem:   Incarcerated ventral hernia s/p SB resection & primary hernia repair 05/22/2017 Active Problems:   OBESITY   OBSTRUCTIVE SLEEP APNEA   Essential hypertension   GASTROESOPHAGEAL REFLUX DISEASE   Memory impairment   Dementia with behavioral disturbance   AKI (acute kidney injury) ([place])   Acute lower UTI   SBO (small bowel obstruction) s/p SB resection 05/22/2017   Ventral hernia with bowel obstruction   Bipolar affective ([place])  Incarcerated ventral hernia/ Hx of crohns dsz Status post ex lap, partial omentectomy, primary repair of ventral hernia, small bowel resection x1 on 05/22/2017 by Dr. Johney Maine Advanced to soft diet, s/p NGT as per Gen surg Management primarily by surgery (pain meds, dvt)  AKI Resolved s/p IVF Likely prerenal due to above Daily BMP  UTI UA with large leukocytes, few bacteria,> 50 WBC clumps UC showed >100,000 E.Coli and >100,000 proteus mirabilis Completed IV Rocephin X 7 days  Acute blood loss anemia Likely from post op Baseline around 10 Daily CBC  Hypokalemia Replace prn  Hypertension Stable Restart home amlodipine Continue IV  Lopressor prn  Hx of atrial flutter Rate controlled Anticoagulation was recently held/stopped at ALF on 05/07/17 unsure if this was temp Awaiting POA to confirm, if pt actually taking AC or not   Hypothyroidism Continue Synthroid  Bipolar Restart home risperidone, bupropion  Dementia Continue home donepezil, Namenda     Code Status: Full  Family Communication: None at bedside  Disposition Plan: SNF   Consultants:  General surgery  Procedures:  Ex lap on 05/22/2017  Antimicrobials:  IV Rocephin  DVT prophylaxis: Lovenox   Objective: Vitals:   05/27/17 0800 05/27/17 1200 05/27/17 1300 05/27/17 1400  BP: (!) 126/36 (!) 155/53 (!) 165/49 (!) 143/50  Pulse:      Resp: (!) 29 18 19  (!) 21  Temp: (!) 97.4 F (36.3 C) 98.6 F (37 C)    TempSrc: Oral Oral    SpO2: 99% 98% 98% 98%  Weight:        Intake/Output Summary (Last 24 hours) at 05/27/2017 1448 Last data filed at 05/27/2017 1239 Gross per 24 hour  Intake 390 ml  Output 150 ml  Net 240 ml   Filed Weights   05/22/17 0701  Weight: 83 kg (182 lb 15.7 oz)    Exam:   General: NAD, alert, awake, oriented to place and self  Cardiovascular: S1, S2 present  Respiratory: CTAB  Abdomen: Soft, non-tender, mildly distended, +bowel sounds  Musculoskeletal: No pedal edema bilaterally  Skin: Normal  Psychiatry: Unable to assess   Data Reviewed: CBC: Recent Labs  Lab 05/21/17 2058  05/23/17 0329 05/24/17 0325 05/25/17  8208 05/26/17 0336 05/27/17 0501  WBC 18.6*   < > 7.9 6.5 6.4 8.0 9.1  NEUTROABS 15.3*  --   --  4.6 3.7 4.9 6.0  HGB 10.9*   < > 8.2* 8.2* 8.8* 9.0* 9.3*  HCT 36.4   < > 28.4* 28.3* 30.4* 31.2* 30.7*  MCV 79.3   < > 80.7 80.9 80.6 80.2 78.5  PLT 425*   < > 268 227 279 219 291   < > = values in this interval not displayed.   Basic Metabolic Panel: Recent Labs  Lab 05/23/17 0329 05/24/17 0325 05/25/17 0331 05/26/17 0336 05/27/17 0250  NA 146* 147* 144 142 139  K 4.0  3.7 2.8* 3.3* 3.6  CL 112* 112* 106 105 102  CO2 23 22 27 25 26   GLUCOSE 112* 92 143* 111* 113*  BUN 27* 19 10 9 9   CREATININE 1.18* 0.81 0.68 0.70 0.70  CALCIUM 8.1* 8.2* 8.2* 8.2* 8.6*   GFR: Estimated Creatinine Clearance: 56.5 mL/min (by C-G formula based on SCr of 0.7 mg/dL). Liver Function Tests: Recent Labs  Lab 05/21/17 2058  AST 26  ALT 20  ALKPHOS 113  BILITOT 0.7  PROT 7.7  ALBUMIN 3.8   Recent Labs  Lab 05/21/17 2058  LIPASE 32   No results for input(s): AMMONIA in the last 168 hours. Coagulation Profile: No results for input(s): INR, PROTIME in the last 168 hours. Cardiac Enzymes: No results for input(s): CKTOTAL, CKMB, CKMBINDEX, TROPONINI in the last 168 hours. BNP (last 3 results) No results for input(s): PROBNP in the last 8760 hours. HbA1C: No results for input(s): HGBA1C in the last 72 hours. CBG: No results for input(s): GLUCAP in the last 168 hours. Lipid Profile: No results for input(s): CHOL, HDL, LDLCALC, TRIG, CHOLHDL, LDLDIRECT in the last 72 hours. Thyroid Function Tests: No results for input(s): TSH, T4TOTAL, FREET4, T3FREE, THYROIDAB in the last 72 hours. Anemia Panel: No results for input(s): VITAMINB12, FOLATE, FERRITIN, TIBC, IRON, RETICCTPCT in the last 72 hours. Urine analysis:    Component Value Date/Time   COLORURINE AMBER (A) 05/21/2017 2253   APPEARANCEUR CLOUDY (A) 05/21/2017 2253   LABSPEC 1.024 05/21/2017 2253   PHURINE 5.0 05/21/2017 2253   GLUCOSEU NEGATIVE 05/21/2017 2253   GLUCOSEU NEGATIVE 07/22/2008 1105   HGBUR LARGE (A) 05/21/2017 2253   BILIRUBINUR NEGATIVE 05/21/2017 2253   KETONESUR 5 (A) 05/21/2017 2253   PROTEINUR 100 (A) 05/21/2017 2253   UROBILINOGEN 0.2 11/19/2014 1430   NITRITE NEGATIVE 05/21/2017 2253   LEUKOCYTESUR LARGE (A) 05/21/2017 2253   Sepsis Labs: @LABRCNTIP (procalcitonin:4,lacticidven:4)  ) Recent Results (from the past 240 hour(s))  Urine culture     Status: Abnormal   Collection  Time: 05/21/17 11:32 PM  Result Value Ref Range Status   Specimen Description   Final    URINE, CATHETERIZED Performed at [facility], [place] [address] [place], [place] [address]    Special Requests   Final    NONE Performed at [facility] [address], [place], [place] [address]    Culture (A)  Final    >=100,000 COLONIES/mL ESCHERICHIA COLI >=100,000 COLONIES/mL PROTEUS MIRABILIS    Report Status 05/25/2017 FINAL  Final   Organism ID, Bacteria ESCHERICHIA COLI (A)  Final   Organism ID, Bacteria PROTEUS MIRABILIS (A)  Final      Susceptibility   Escherichia coli - MIC*    AMPICILLIN 8 SENSITIVE Sensitive     CEFAZOLIN <=4 SENSITIVE Sensitive  CEFTRIAXONE <=1 SENSITIVE Sensitive     CIPROFLOXACIN >=4 RESISTANT Resistant     GENTAMICIN <=1 SENSITIVE Sensitive     IMIPENEM <=0.25 SENSITIVE Sensitive     NITROFURANTOIN <=16 SENSITIVE Sensitive     TRIMETH/SULFA <=20 SENSITIVE Sensitive     AMPICILLIN/SULBACTAM 4 SENSITIVE Sensitive     PIP/TAZO <=4 SENSITIVE Sensitive     * >=100,000 COLONIES/mL ESCHERICHIA COLI   Proteus mirabilis - MIC*    AMPICILLIN <=2 SENSITIVE Sensitive     CEFAZOLIN <=4 SENSITIVE Sensitive     CEFTRIAXONE <=1 SENSITIVE Sensitive     CIPROFLOXACIN <=0.25 SENSITIVE Sensitive     GENTAMICIN <=1 SENSITIVE Sensitive     IMIPENEM 1 SENSITIVE Sensitive     NITROFURANTOIN 128 RESISTANT Resistant     TRIMETH/SULFA <=20 SENSITIVE Sensitive     AMPICILLIN/SULBACTAM <=2 SENSITIVE Sensitive     PIP/TAZO <=4 SENSITIVE Sensitive     * >=100,000 COLONIES/mL PROTEUS MIRABILIS  MRSA PCR Screening     Status: None   Collection Time: 05/22/17  3:04 PM  Result Value Ref Range Status   MRSA by PCR NEGATIVE NEGATIVE Final    Comment:        The GeneXpert MRSA Assay (FDA approved for NASAL specimens only), is one component of a comprehensive MRSA colonization surveillance program. It is not intended to diagnose  MRSA infection nor to guide or monitor treatment for MRSA infections. Performed at [facility], [place] [address], [place], [place] [address]       Studies: No results found.  Scheduled Meds: . amLODipine  5 mg Oral Daily  . bisacodyl  10 mg Rectal Daily  . buPROPion  150 mg Oral Daily  . Chlorhexidine Gluconate Cloth  6 each Topical Once  . enoxaparin (LOVENOX) injection  40 mg Subcutaneous Q24H  . [START ON 05/28/2017] famotidine  20 mg Oral Daily  . Gerhardt's butt cream   Topical TID  . [START ON 05/28/2017] levothyroxine  88 mcg Oral QAC breakfast  . lip balm  1 application Topical BID  . risperiDONE  0.5 mg Oral QHS    Continuous Infusions: . cefTRIAXone (ROCEPHIN)  IV Stopped (05/26/17 2153)  . methocarbamol (ROBAXIN)  IV    . ondansetron (ZOFRAN) IV       LOS: 5 days     Alma Friendly, MD [facility]   If 7PM-7AM, please contact night-coverage [email or web address] Password [facility] 05/27/2017, 2:48 PM

## 2017-05-27 NOTE — Progress Notes (Signed)
Pharmacy IV to PO conversion  The patient is receiving famotidine, levothyroxine, and diphenhydramine by the intravenous route.  Based on criteria approved by the Pharmacy and [place], the medication is being converted to the equivalent oral dose form.   No active GI bleeding or impaired absorption  Not s/p esophagectomy  Documented ability to take oral medications for > 24 hr  Plan to continue treatment for at least 1 day  If you have any questions about this conversion, please contact the Pharmacy Department (ext [phone]).  Thank you.  Reuel Boom, PharmD, BCPS [phone] 05/27/2017, 2:34 PM

## 2017-05-27 NOTE — Progress Notes (Signed)
[facility] Progress Note  5 Days Post-Op  Subjective: CC:  No complaints. Oriented to person but not place or time. Denies abdominal pain, nausea, or vomiting. Per chart review pt had 2 BMs yesterday. No emesis.   Objective: Vital signs in last 24 hours: Temp:  [97.5 F (36.4 C)-98.8 F (37.1 C)] 97.8 F (36.6 C) (05/21 0307) Resp:  [14-24] 21 (05/21 0600) BP: (127-204)/(16-86) 144/40 (05/21 0600) SpO2:  [97 %-100 %] 98 % (05/21 0600) Last BM Date: 05/26/17  Intake/Output from previous day: 05/20 0701 - 05/21 0700 In: 150 [IV Piggyback:150] Out: 100 [Urine:100] Intake/Output this shift: No intake/output data recorded.  PE: Gen:  Alert, NAD, pleasant Card:  Regular rate and rhythm, pedal pulses 2+ BL Pulm:  Normal effort, clear to auscultation bilaterally Abd: Soft, non-tender, non-distended, bowel sounds present, incision c/d/i; bed is soiled with stool. Skin: warm and dry, no rashes  Psych: A&Ox3   Lab Results:  Recent Labs    05/26/17 0336 05/27/17 0501  WBC 8.0 9.1  HGB 9.0* 9.3*  HCT 31.2* 30.7*  PLT 219 291   BMET Recent Labs    05/26/17 0336 05/27/17 0250  NA 142 139  K 3.3* 3.6  CL 105 102  CO2 25 26  GLUCOSE 111* 113*  BUN 9 9  CREATININE 0.70 0.70  CALCIUM 8.2* 8.6*   PT/INR No results for input(s): LABPROT, INR in the last 72 hours. CMP     Component Value Date/Time   NA 139 05/27/2017 0250   NA 139 07/22/2016 1234   K 3.6 05/27/2017 0250   K 4.3 07/22/2016 1234   CL 102 05/27/2017 0250   CO2 26 05/27/2017 0250   CO2 26 07/22/2016 1234   GLUCOSE 113 (H) 05/27/2017 0250   GLUCOSE 100 07/22/2016 1234   BUN 9 05/27/2017 0250   BUN 16.1 07/22/2016 1234   CREATININE 0.70 05/27/2017 0250   CREATININE 0.9 07/22/2016 1234   CALCIUM 8.6 (L) 05/27/2017 0250   CALCIUM 9.9 07/22/2016 1234   PROT 7.7 05/21/2017 2058   PROT 7.4 07/22/2016 1234   ALBUMIN 3.8 05/21/2017 2058   ALBUMIN 3.7 07/22/2016 1234   AST 26 05/21/2017  2058   AST 14 07/22/2016 1234   ALT 20 05/21/2017 2058   ALT 9 07/22/2016 1234   ALKPHOS 113 05/21/2017 2058   ALKPHOS 90 07/22/2016 1234   BILITOT 0.7 05/21/2017 2058   BILITOT 0.64 07/22/2016 1234   GFRNONAA >60 05/27/2017 0250   GFRAA >60 05/27/2017 0250   Lipase     Component Value Date/Time   LIPASE 32 05/21/2017 2058       Studies/Results: No results found.  Anti-infectives: Anti-infectives (From admission, onward)   Start     Dose/Rate Route Frequency Ordered Stop   05/22/17 2200  cefTRIAXone (ROCEPHIN) 1 g in sodium chloride 0.9 % 100 mL IVPB     1 g 200 mL/hr over 30 Minutes Intravenous Every 24 hours 05/22/17 0139     05/22/17 0600  cefoTEtan (CEFOTAN) 2 g in sodium chloride 0.9 % 100 mL IVPB     2 g 200 mL/hr over 30 Minutes Intravenous On call to O.R. 05/22/17 0309 05/22/17 0442   05/22/17 0441  cefoTEtan in Dextrose 5% (CEFOTAN) 2-2.08 GM-%(50ML) IVPB    Note to Pharmacy:  Virgina Organ   : cabinet override      05/22/17 0441 05/22/17 1644   05/22/17 0130  azithromycin (ZITHROMAX) 500 mg in sodium chloride 0.9 % 250  mL IVPB     500 mg 250 mL/hr over 60 Minutes Intravenous  Once 05/22/17 0122 05/22/17 0315   05/21/17 2345  cefTRIAXone (ROCEPHIN) 1 g in sodium chloride 0.9 % 100 mL IVPB     1 g 200 mL/hr over 30 Minutes Intravenous  Once 05/21/17 2332 05/22/17 0117   05/21/17 0000  cephALEXin (KEFLEX) 500 MG capsule     500 mg Oral 4 times daily 05/21/17 2349         Assessment/Plan UTI - Rocephin per primary HTN Hx atrial flutter Bipolar disorder dementa ABL anemia - stable   SBO 2/2 incarcerated ventral hernia  s/pEXPLORATORY LAPAROTOMY reduction repair incarcerated ventral wall hernia small bowel reseection 05/22/17 Dr. Johney Maine  - POD#5, afebrile, VSS, no leukocytosis  - having bowel movements  - tolerating fulls, advance to SOFT diet. - mobilize with therapies and up to chair with nursing staff - incentive spirometry   FEN - start full  liquids; hypokalemia (3.3) - replace  ID - Roceph 5/16 >> (Day#5) VTE - SCD's, Lovenox; hgb 9.3, stable Foley - external cath  Dispo -SNF when medically stable   Plan: If patient tolerates a soft diet, she is medically stable for discharge to SNF from a surgical perspective. No role for continued abx from surgical standpoint. She should continue a SOFT, low fiber diet for another 1-2 weeks. She will require post-operative follow up with Dr. Michael Boston as below. Discharge instructions provided.     LOS: 5 days    Jill Alexanders , [facility] 05/27/2017, 7:39 AM Pager: [phone] Consults: [phone] Mon-Fri 7:00 am-4:30 pm Sat-Sun 7:00 am-11:30 am

## 2017-05-27 NOTE — Progress Notes (Signed)
Received a call from Kittie Plater, pt's daughter.  [phone] at 2200.  RN was unable to talk at that time.  [name] stated she had visited the patient every day and had her name on the pt's room board to receive calls with status updates on patient.  Michelle's name is not on the contact list in the chart and there is no documentation to this in the shadow chart.  RN was planning to inform [name] she needs to get documentation to receive updated information on her mother's status.  RN left a message on Michelle's voicemail to call her back when she has a moment.  Iantha Fallen RN 10:22 PM 05/27/2017

## 2017-05-27 NOTE — Evaluation (Signed)
Occupational Therapy Evaluation Patient Details Name: [name] MRN: [id number] DOB: 23-Jan-1932 Today's Date: 05/27/2017    History of Present Illness 84 yo female admitted with incarcerated ventral hernia. S/P SB resection and hernia repair 05/22/17. hx of dementia, bipolar, obesity, fibromyalgia   Clinical Impression   Pt was admitted for the above. At baseline, she lives in independent living at [facility] and has assist for adls from aides as needed.  She was unable to sit EOB unsupported and needed max +2 to EOB.  She needed total A +1-2 for adls. Will follow in acute setting with the goals below    Follow Up Recommendations  SNF    Equipment Recommendations  (to be further assessed ? 3:1)    Recommendations for Other Services       Precautions / Restrictions Precautions Precautions: Fall Precaution Comments: abdominal surgery.  incontinent Restrictions Weight Bearing Restrictions: No      Mobility Bed Mobility         Supine to sit: Max assist;+2 for safety/equipment Sit to supine: Max assist;+2 for safety/equipment   General bed mobility comments: assist for legs and trunk.  Pt did assist and held to bedrail with cues  Transfers                 General transfer comment: not performed    Balance     Sitting balance-Leahy Scale: Poor     Standing balance support: Bilateral upper extremity supported;During functional activity                               ADL either performed or assessed with clinical judgement   ADL Overall ADL's : Needs assistance/impaired     Grooming: Wash/dry face;Bed level;Total assistance                                 General ADL Comments: pt lethargic; attempted to wash face but only did about 10%.  She used hands to support herself EOB and still needed trunk support for balance. At time of evaluation, total A needed for UB adls and total +2 for LB from a bed level     Vision          Perception     Praxis      Pertinent Vitals/Pain Pain Assessment: Faces Faces Pain Scale: Hurts little more Pain Location: abdomen Pain Descriptors / Indicators: Grimacing Pain Intervention(s): Limited activity within patient's tolerance;Monitored during session;Repositioned;Ice applied     Hand Dominance     Extremity/Trunk Assessment Upper Extremity Assessment Upper Extremity Assessment: Generalized weakness           Communication Communication Communication: No difficulties   Cognition Arousal/Alertness: Awake/alert(I had aroused her from sleep) Behavior During Therapy: Flat affect Overall Cognitive Status: History of cognitive impairments - at baseline                                     General Comments       Exercises     Shoulder Instructions      Home Living Family/patient expects to be discharged to:: Unsure  Additional Comments: was at [facility] with husband and private duty aides      Prior Functioning/Environment          Comments: aides assisted with adls as needed.  One aide in room and states pt liked to dance prior to this sx.        OT Problem List: Decreased strength;Decreased activity tolerance;Impaired balance (sitting and/or standing);Decreased cognition;Decreased safety awareness;Pain      OT Treatment/Interventions: Self-care/ADL training;Therapeutic exercise;DME and/or AE instruction;Energy conservation;Balance training;Patient/family education;Therapeutic activities    OT Goals(Current goals can be found in the care plan section) Acute Rehab OT Goals Patient Stated Goal: none stated OT Goal Formulation: Patient unable to participate in goal setting Time For Goal Achievement: 06/10/17 Potential to Achieve Goals: Fair ADL Goals Pt Will Transfer to Toilet: with mod assist;with +2 assist;bedside commode;stand pivot transfer Additional ADL Goal #1: pt will  sit eob unsupported for 3 minutes with min guard in preparation for toilet transfers Additional ADL Goal #2: pt will perform UB adls with min A supported sitting Additional ADL Goal #3: Pt will go from sit to stand with mod +2 assistance  OT Frequency: Min 2X/week   Barriers to D/C:            Co-evaluation              AM-PAC PT "6 Clicks" Daily Activity     Outcome Measure Help from another person eating meals?: A Little Help from another person taking care of personal grooming?: Total Help from another person toileting, which includes using toliet, bedpan, or urinal?: Total Help from another person bathing (including washing, rinsing, drying)?: Total Help from another person to put on and taking off regular upper body clothing?: Total Help from another person to put on and taking off regular lower body clothing?: Total 6 Click Score: 8   End of Session    Activity Tolerance: Patient limited by lethargy;Patient limited by pain;Patient limited by fatigue Patient left: in bed;with call bell/phone within reach;with bed alarm set;with nursing/sitter in room  OT Visit Diagnosis: Muscle weakness (generalized) (M62.81)                Time: 2863-8177 OT Time Calculation (min): 23 min Charges:  OT General Charges $OT Visit: 1 Visit OT Evaluation $OT Eval Moderate Complexity: 1 Mod G-Codes:     [place], OTR/L [phone] 05/27/2017  [name] 05/27/2017, 4:07 PM

## 2017-05-27 NOTE — Discharge Instructions (Signed)
You have a urinary tract infection.  Increase fluids.  Prescription for antibiotic.  Tylenol for fever pain.   [facility], [place] [phone]  OPEN ABDOMINAL SURGERY: POST OP INSTRUCTIONS  Always review your discharge instruction sheet given to you by the facility where your surgery was performed.  IF YOU HAVE DISABILITY OR FAMILY LEAVE FORMS, YOU MUST BRING THEM TO THE OFFICE FOR PROCESSING.  PLEASE DO NOT GIVE THEM TO YOUR DOCTOR.  1. A prescription for pain medication may be given to you upon discharge.  Take your pain medication as prescribed, if needed.  If narcotic pain medicine is not needed, then you may take acetaminophen (Tylenol) or ibuprofen (Advil) as needed. 2. Take your usually prescribed medications unless otherwise directed. 3. If you need a refill on your pain medication, please contact your pharmacy. They will contact our office to request authorization.  Prescriptions will not be filled after 5pm or on week-ends. 4. You should follow a light diet the first few days after arrival home, such as soup and crackers, pudding, etc.unless your doctor has advised otherwise. A high-fiber, low fat diet can be resumed as tolerated.   Be sure to include lots of fluids daily. Most patients will experience some swelling and bruising on the chest and neck area.  Ice packs will help.  Swelling and bruising can take several days to resolve 5. Most patients will experience some swelling and bruising in the area of the incision. Ice pack will help. Swelling and bruising can take several days to resolve..  6. It is common to experience some constipation if taking pain medication after surgery.  Increasing fluid intake and taking a stool softener will usually help or prevent this problem from occurring.  A mild laxative (Milk of Magnesia or Miralax) should be taken according to package directions if there are no bowel movements after 48 hours. 7.  You may have steri-strips (small  skin tapes) in place directly over the incision.  These strips should be left on the skin for 7-10 days.  If your surgeon used skin glue on the incision, you may shower in 24 hours.  The glue will flake off over the next 2-3 weeks.  Any sutures or staples will be removed at the office during your follow-up visit. You may find that a light gauze bandage over your incision may keep your staples from being rubbed or pulled. You may shower and replace the bandage daily. 8. ACTIVITIES:  You may resume regular (light) daily activities beginning the next day--such as daily self-care, walking, climbing stairs--gradually increasing activities as tolerated.  You may have sexual intercourse when it is comfortable.  Refrain from any heavy lifting or straining until approved by your doctor. a. You may drive when you no longer are taking prescription pain medication, you can comfortably wear a seatbelt, and you can safely maneuver your car and apply brakes b. Return to Work: ___________________________________ 78. You should see your doctor in the office for a follow-up appointment approximately two weeks after your surgery.  Make sure that you call for this appointment within a day or two after you arrive home to insure a convenient appointment time. OTHER INSTRUCTIONS:  _____________________________________________________________ _____________________________________________________________  WHEN TO CALL YOUR DOCTOR: 1. Fever over 101.0 2. Inability to urinate 3. Nausea and/or vomiting 4. Extreme swelling or bruising 5. Continued bleeding from incision. 6. Increased pain, redness, or drainage from the incision. 7. Difficulty swallowing or breathing 8. Muscle cramping  or spasms. 9. Numbness or tingling in hands or feet or around lips.  The clinic staff is available to answer your questions during regular business hours.  Please dont hesitate to call and ask to speak to one of the nurses if you have  concerns.  For further questions, please visit [email or web address]  Low-Fiber Diet Fiber is found in fruits, vegetables, and whole grains. A low-fiber diet restricts fibrous foods that are not digested in the small intestine. A diet containing about 10-15 grams of fiber per day is considered low fiber. Low-fiber diets may be used to:  Promote healing and rest the bowel during intestinal flare-ups.  Prevent blockage of a partially obstructed or narrowed gastrointestinal tract.  Reduce fecal weight and volume.  Slow the movement of feces.  You may be on a low-fiber diet as a transitional diet following surgery, after an injury (trauma), or because of a short (acute) or lifelong (chronic) illness. Your health care provider will determine the length of time you need to stay on this diet. What do I need to know about a low-fiber diet? Always check the fiber content on the packaging's Nutrition Facts label, especially on foods from the grains list. Ask your dietitian if you have questions about specific foods that are related to your condition, especially if the food is not listed below. In general, a low-fiber food will have less than 2 g of fiber. What foods can I eat? Grains All breads and crackers made with white flour. Sweet rolls, doughnuts, waffles, pancakes, [place] toast, bagels. Pretzels, Melba toast, zwieback. Well-cooked cereals, such as cornmeal, farina, or cream cereals. Dry cereals that do not contain whole grains, fruit, or nuts, such as refined corn, wheat, rice, and oat cereals. Potatoes prepared any way without skins, plain pastas and noodles, refined white rice. Use white flour for baking and making sauces. Use allowed list of grains for casseroles, dumplings, and puddings. Vegetables Strained tomato and vegetable juices. Fresh lettuce, cucumber, spinach. Well-cooked (no skin or pulp) or canned vegetables, such as asparagus, bean sprouts, beets, carrots, green beans,  mushrooms, potatoes, pumpkin, spinach, yellow squash, tomato sauce/puree, turnips, yams, and zucchini. Keep servings limited to  cup. Fruits All fruit juices except prune juice. Cooked or canned fruits without skin and seeds, such as applesauce, apricots, cherries, fruit cocktail, grapefruit, grapes, mandarin oranges, melons, peaches, pears, pineapple, and plums. Fresh fruits without skin, such as apricots, avocados, bananas, melons, pineapple, nectarines, and peaches. Keep servings limited to  cup or 1 piece. Meat and Other Protein Sources Ground or well-cooked tender beef, ham, veal, lamb, pork, or poultry. Eggs, plain cheese. Fish, oysters, shrimp, lobster, and other seafood. Liver, organ meats. Smooth nut butters. Dairy All milk products and alternative dairy substitutes, such as soy, rice, almond, and coconut, not containing added whole nuts, seeds, or added fruit. Beverages Decaf coffee, fruit, and vegetable juices or smoothies (small amounts, with no pulp or skins, and with fruits from allowed list), sports drinks, herbal tea. Condiments Ketchup, mustard, vinegar, cream sauce, cheese sauce, cocoa powder. Spices in moderation, such as allspice, basil, bay leaves, celery powder or leaves, cinnamon, cumin powder, curry powder, ginger, mace, marjoram, onion or garlic powder, oregano, paprika, parsley flakes, ground pepper, rosemary, sage, savory, tarragon, thyme, and turmeric. Sweets and Desserts Plain cakes and cookies, pie made with allowed fruit, pudding, custard, cream pie. Gelatin, fruit, ice, sherbet, frozen ice pops. Ice cream, ice milk without nuts. Plain hard candy, honey, jelly, molasses, syrup, sugar, chocolate syrup, gumdrops,  marshmallows. Limit overall sugar intake. Fats and Oil Margarine, butter, cream, mayonnaise, salad oils, plain salad dressings made from allowed foods. Choose healthy fats such as olive oil, canola oil, and omega-3 fatty acids (such as found in salmon or tuna)  when possible. Other Bouillon, broth, or cream soups made from allowed foods. Any strained soup. Casseroles or mixed dishes made with allowed foods. The items listed above may not be a complete list of recommended foods or beverages. Contact your dietitian for more options. What foods are not recommended? Grains All whole wheat and whole grain breads and crackers. Multigrains, rye, bran seeds, nuts, or coconut. Cereals containing whole grains, multigrains, bran, coconut, nuts, raisins. Cooked or dry oatmeal, steel-cut oats. Coarse wheat cereals, granola. Cereals advertised as high fiber. Potato skins. Whole grain pasta, wild or brown rice. Popcorn. Coconut flour. Bran, buckwheat, corn bread, multigrains, rye, wheat germ. Vegetables Fresh, cooked or canned vegetables, such as artichokes, asparagus, beet greens, broccoli, Brussels sprouts, cabbage, celery, cauliflower, corn, eggplant, kale, legumes or beans, okra, peas, and tomatoes. Avoid large servings of any vegetables, especially raw vegetables. Fruits Fresh fruits, such as apples with or without skin, berries, cherries, figs, grapes, grapefruit, guavas, kiwis, mangoes, oranges, papayas, pears, persimmons, pineapple, and pomegranate. Prune juice and juices with pulp, stewed or dried prunes. Dried fruits, dates, raisins. Fruit seeds or skins. Avoid large servings of all fresh fruits. Meats and Other Protein Sources Tough, fibrous meats with gristle. Chunky nut butter. Cheese made with seeds, nuts, or other foods not recommended. Nuts, seeds, legumes (beans, including baked beans), dried peas, beans, lentils. Dairy Yogurt or cheese that contains nuts, seeds, or added fruit. Beverages Fruit juices with high pulp, prune juice. Caffeinated coffee and teas. Condiments Coconut, maple syrup, pickles, olives. Sweets and Desserts Desserts, cookies, or candies that contain nuts or coconut, chunky peanut butter, dried fruits. Jams, preserves with seeds,  marmalade. Large amounts of sugar and sweets. Any other dessert made with fruits from the not recommended list. Other Soups made from vegetables that are not recommended or that contain other foods not recommended. The items listed above may not be a complete list of foods and beverages to avoid. Contact your dietitian for more information. This information is not intended to replace advice given to you by your health care provider. Make sure you discuss any questions you have with your health care provider. Document Released: 06/15/2001 Document Revised: 06/01/2015 Document Reviewed: 11/16/2012 Elsevier Interactive Patient Education  2017 [employer].

## 2017-05-28 LAB — CBC WITH DIFFERENTIAL/PLATELET
BASOS PCT: 0 %
Basophils Absolute: 0 10*3/uL (ref 0.0–0.1)
EOS ABS: 0.1 10*3/uL (ref 0.0–0.7)
Eosinophils Relative: 2 %
HCT: 30.1 % — ABNORMAL LOW (ref 36.0–46.0)
HEMOGLOBIN: 8.9 g/dL — AB (ref 12.0–15.0)
Lymphocytes Relative: 20 %
Lymphs Abs: 1.6 10*3/uL (ref 0.7–4.0)
MCH: 23.2 pg — AB (ref 26.0–34.0)
MCHC: 29.6 g/dL — AB (ref 30.0–36.0)
MCV: 78.6 fL (ref 78.0–100.0)
MONOS PCT: 9 %
Monocytes Absolute: 0.7 10*3/uL (ref 0.1–1.0)
NEUTROS PCT: 69 %
Neutro Abs: 5.4 10*3/uL (ref 1.7–7.7)
PLATELETS: 312 10*3/uL (ref 150–400)
RBC: 3.83 MIL/uL — AB (ref 3.87–5.11)
RDW: 17.1 % — ABNORMAL HIGH (ref 11.5–15.5)
WBC: 7.9 10*3/uL (ref 4.0–10.5)

## 2017-05-28 LAB — BASIC METABOLIC PANEL
Anion gap: 12 (ref 5–15)
BUN: 8 mg/dL (ref 6–20)
CHLORIDE: 104 mmol/L (ref 101–111)
CO2: 26 mmol/L (ref 22–32)
CREATININE: 0.79 mg/dL (ref 0.44–1.00)
Calcium: 8.7 mg/dL — ABNORMAL LOW (ref 8.9–10.3)
GFR calc Af Amer: >60 mL/min (ref >60)
Glucose, Bld: 132 mg/dL — ABNORMAL HIGH (ref 65–99)
Potassium: 3.1 mmol/L — ABNORMAL LOW (ref 3.5–5.1)
SODIUM: 142 mmol/L (ref 135–145)

## 2017-05-28 MED ORDER — POTASSIUM CHLORIDE CRYS ER 20 MEQ PO TBCR: 40.0000 meq | EXTENDED_RELEASE_TABLET | Freq: Two times a day (BID) | ORAL | 0 refills | Status: DC

## 2017-05-28 MED ORDER — POTASSIUM CHLORIDE CRYS ER 20 MEQ PO TBCR
40.0000 meq | EXTENDED_RELEASE_TABLET | Freq: Every day | ORAL | 0 refills | Status: DC
Start: 2017-05-28 — End: 2017-05-29

## 2017-05-28 MED ORDER — POTASSIUM CHLORIDE CRYS ER 20 MEQ PO TBCR
40.0000 meq | EXTENDED_RELEASE_TABLET | Freq: Two times a day (BID) | ORAL | Status: AC
  Administered 2017-05-28 (×2): 40 meq via ORAL
  Filled 2017-05-28 (×2): qty 2

## 2017-05-28 NOTE — Progress Notes (Addendum)
[facility] Progress Note  6 Days Post-Op  Subjective: CC:  NAE, no complaints. Denies abdominal pain or nausea. Tolerating PO. Working with therapies.  Objective: Vital signs in last 24 hours: Temp:  [97.3 F (36.3 C)-99.8 F (37.7 C)] 99.8 F (37.7 C) (05/22 0400) Resp:  [14-31] 31 (05/22 0700) BP: (126-188)/(36-98) 175/41 (05/22 0700) SpO2:  [89 %-100 %] 89 % (05/22 0700) Last BM Date: 05/27/17  Intake/Output from previous day: 05/21 0701 - 05/22 0700 In: 290 [P.O.:240; IV Piggyback:50] Out: 50 [Urine:50] Intake/Output this shift: No intake/output data recorded.  PE: Gen:  Alert, NAD, pleasant Card:  Regular rate and rhythm, pedal pulses 2+ BL Pulm:  Normal effort, clear to auscultation bilaterally Abd: Soft, non-tender, non-distended, bowel sounds present, lower abdominal transverse incision c/d/i - small skin opening around central aspect of incision with mild serous drainage. No erythema or purulence. Skin: warm and dry, no rashes  Psych: A&Ox3   Lab Results:  Recent Labs    05/27/17 0501 05/28/17 0315  WBC 9.1 7.9  HGB 9.3* 8.9*  HCT 30.7* 30.1*  PLT 291 312   BMET Recent Labs    05/27/17 0250 05/28/17 0315  NA 139 142  K 3.6 3.1*  CL 102 104  CO2 26 26  GLUCOSE 113* 132*  BUN 9 8  CREATININE 0.70 0.79  CALCIUM 8.6* 8.7*   PT/INR No results for input(s): LABPROT, INR in the last 72 hours. CMP     Component Value Date/Time   NA 142 05/28/2017 0315   NA 139 07/22/2016 1234   K 3.1 (L) 05/28/2017 0315   K 4.3 07/22/2016 1234   CL 104 05/28/2017 0315   CO2 26 05/28/2017 0315   CO2 26 07/22/2016 1234   GLUCOSE 132 (H) 05/28/2017 0315   GLUCOSE 100 07/22/2016 1234   BUN 8 05/28/2017 0315   BUN 16.1 07/22/2016 1234   CREATININE 0.79 05/28/2017 0315   CREATININE 0.9 07/22/2016 1234   CALCIUM 8.7 (L) 05/28/2017 0315   CALCIUM 9.9 07/22/2016 1234   PROT 7.7 05/21/2017 2058   PROT 7.4 07/22/2016 1234   ALBUMIN 3.8 05/21/2017  2058   ALBUMIN 3.7 07/22/2016 1234   AST 26 05/21/2017 2058   AST 14 07/22/2016 1234   ALT 20 05/21/2017 2058   ALT 9 07/22/2016 1234   ALKPHOS 113 05/21/2017 2058   ALKPHOS 90 07/22/2016 1234   BILITOT 0.7 05/21/2017 2058   BILITOT 0.64 07/22/2016 1234   GFRNONAA >60 05/28/2017 0315   GFRAA >60 05/28/2017 0315   Lipase     Component Value Date/Time   LIPASE 32 05/21/2017 2058       Studies/Results: No results found.  Anti-infectives: Anti-infectives (From admission, onward)   Start     Dose/Rate Route Frequency Ordered Stop   05/22/17 2200  cefTRIAXone (ROCEPHIN) 1 g in sodium chloride 0.9 % 100 mL IVPB  Status:  Discontinued     1 g 200 mL/hr over 30 Minutes Intravenous Every 24 hours 05/22/17 0139 05/27/17 1453   05/22/17 0600  cefoTEtan (CEFOTAN) 2 g in sodium chloride 0.9 % 100 mL IVPB     2 g 200 mL/hr over 30 Minutes Intravenous On call to O.R. 05/22/17 0309 05/22/17 0442   05/22/17 0441  cefoTEtan in Dextrose 5% (CEFOTAN) 2-2.08 GM-%(50ML) IVPB    Note to Pharmacy:  Virgina Organ   : cabinet override      05/22/17 0441 05/22/17 1644   05/22/17 0130  azithromycin (ZITHROMAX) 500  mg in sodium chloride 0.9 % 250 mL IVPB     500 mg 250 mL/hr over 60 Minutes Intravenous  Once 05/22/17 0122 05/22/17 0315   05/21/17 2345  cefTRIAXone (ROCEPHIN) 1 g in sodium chloride 0.9 % 100 mL IVPB     1 g 200 mL/hr over 30 Minutes Intravenous  Once 05/21/17 2332 05/22/17 0117   05/21/17 0000  cephALEXin (KEFLEX) 500 MG capsule     500 mg Oral 4 times daily 05/21/17 2349       Assessment/Plan UTI - completed 7d abx HTN Hx atrial flutter - resuming Xarelto? Pharmacy and primary team managing this.  Bipolar disorder dementa ABL anemia - stable   SBO 2/2 incarcerated ventral hernia s/pEXPLORATORY LAPAROTOMY reduction repair incarcerated ventral wall hernia small bowel reseection5/16/19 Dr. Johney Maine  - POD#6, afebrile, VSS, no leukocytosis - having bowel movements  -  tolerating SOFT diet. - mobilizewith therapies and up to chair with nursing staff - incentive spirometry  FEN -SOFT; hypokalemia (3.1) - replace ID - Rocephin x 7 d completed  VTE - SCD's, Lovenox; hgb stable around 9  Foley - external cath  Dispo -SNF   Plan: pt medically stable for discharge to SNF from a surgical perspective. She should continue a SOFT, low fiber diet for another 1-2 weeks. She will require post-operative follow up with Dr. Michael Boston as below. Discharge instructions provided.       LOS: 6 days    Jill Alexanders , [facility] 05/28/2017, 7:39 AM Pager: [phone] Consults: [phone] Mon-Fri 7:00 am-4:30 pm Sat-Sun 7:00 am-11:30 am

## 2017-05-28 NOTE — NC FL2 (Addendum)
[place] LEVEL OF CARE SCREENING TOOL     IDENTIFICATION  Patient Name: [name] Birthdate: 1932/03/31 Sex: female Admission Date (Current Location): 05/21/2017  [facility] and [place] Number:  Herbalist and Address:  [facility],  [place] [address], [place]      Provider Number: [id number]  Attending Physician Name and Address:  Shelly Coss, MD  Relative Name and Phone Number:       Current Level of Care: Hospital Recommended Level of Care: [place] Prior Approval Number:    Date Approved/Denied:   PASRR Number:   [id number]   Discharge Plan: SNF    Current Diagnoses: Patient Active Problem List   Diagnosis Date Noted  . AKI (acute kidney injury) ([place]) 05/22/2017  . Acute lower UTI 05/22/2017  . SBO (small bowel obstruction) s/p SB resection 05/22/2017 05/22/2017  . Incarcerated ventral hernia s/p SB resection & primary hernia repair 05/22/2017 05/22/2017  . Ventral hernia with bowel obstruction 05/22/2017  . Crohn's disease ([place])   . Bipolar affective ([place])   . Lymphedema of left arm 03/20/2016  . Arm edema 03/18/2016  . Breast cancer of lower-outer quadrant of left female breast ([place]) 11/19/2015  . Dementia with behavioral disturbance 04/27/2015  . Bradycardia   . Sinus bradycardia 02/03/2015  . Syncope 02/03/2015  . Allergic rhinitis 11/04/2013  . Insomnia 11/04/2013  . C7 cervical fracture ([place]) 10/12/2013  . Obesity (BMI 30-39.9) 04/01/2013  . Osteoarthritis 12/16/2012  . Memory impairment 01/15/2012  . Low back pain 02/13/2011  . Low back strain 02/13/2011  . MERKEL CELL TUMOR 12/14/2009  . OTHER DISEASES OF LUNG NOT ELSEWHERE CLASSIFIED 12/14/2009  . RESTLESS LEG SYNDROME 10/16/2008  . H/O Psychosis in past, hospitaluized in '09, in ED Nov 2015 12/15/2007  . Regional enteritis ([place]) 12/15/2007  . Cerebrovascular disease 08/12/2007  . COLONIC POLYPS 04/29/2007  . Hypothyroidism  04/29/2007  . BRONCHITIS, RECURRENT 04/29/2007  . DIVERTICULOSIS OF COLON 04/29/2007  . Irritable bowel syndrome 04/29/2007  . Syncope and collapse 04/28/2007  . OBESITY 01/29/2007  . CAD- minor CAD 2006 01/29/2007  . H/O A flutter, s/p RFA 2005 01/29/2007  . GASTROESOPHAGEAL REFLUX DISEASE 01/29/2007  . OSTEOPENIA 01/29/2007  . HEADACHE, MIXED 01/29/2007  . Hyperlipidemia 01/12/2007  . OBSTRUCTIVE SLEEP APNEA 01/12/2007  . Essential hypertension 01/12/2007  . FIBROMYALGIA 01/12/2007  . HYPERPARATHYROIDISM, HX OF 10/21/2006    Orientation RESPIRATION BLADDER Height & Weight     Self  O2(3L) Continent Weight: 182 lb 15.7 oz (83 kg) Height:     BEHAVIORAL SYMPTOMS/MOOD NEUROLOGICAL BOWEL NUTRITION STATUS      Continent Diet(See D/C summary )  AMBULATORY STATUS COMMUNICATION OF NEEDS Skin   Extensive Assist Verbally Surgical wounds(Abdomen )  Left Breast Surgical Incision                      Personal Care Assistance Level of Assistance  Bathing, Feeding, Dressing Bathing Assistance: Limited assistance Feeding assistance: Independent Dressing Assistance: Limited assistance     Functional Limitations Info  Hearing, Sight, Speech Sight Info: Impaired Hearing Info: Adequate Speech Info: Adequate    SPECIAL CARE FACTORS FREQUENCY  PT (By licensed PT), OT (By licensed OT)     PT Frequency: 5X/WEEK OT Frequency: 5X/WEEK            Contractures      Additional Factors Info  Code Status, Allergies Code Status Info: Fullcode Allergies Info: Allergies: Prednisone,  Carbamazepine, Codeine, Demerol Meperidine, Diclofenac Sodium, Fentanyl, Fluoxetine Hcl, Gentamicin, Hydromorphone Hcl, Ibuprofen, Indomethacin, Lisinopril, Meperidine Hcl, Morphine, Pregabalin, Propranolol Hcl, Refresh Lacri-lube Eye Lubricant, Ropinirole Hcl, Sulfamethoxazole, White Petrolatum-mineral Oil  Aquaphilic, Bacitracin           Current Medications (05/28/2017):  This is the current  hospital active medication list Current Facility-Administered Medications  Medication Dose Route Frequency Provider Last Rate Last Dose  . acetaminophen (TYLENOL) suppository 650 mg  650 mg Rectal Q6H PRN Michael Boston, MD      . alum & mag hydroxide-simeth (MAALOX/MYLANTA) 200-200-20 MG/5ML suspension 30 mL  30 mL Oral Q6H PRN Michael Boston, MD      . amLODipine (NORVASC) tablet 5 mg  5 mg Oral Daily Alma Friendly, MD   5 mg at 05/28/17 0925  . bisacodyl (DULCOLAX) suppository 10 mg  10 mg Rectal Q12H PRN Michael Boston, MD      . bisacodyl (DULCOLAX) suppository 10 mg  10 mg Rectal Daily Michael Boston, MD   10 mg at 05/26/17 1014  . buPROPion (WELLBUTRIN XL) 24 hr tablet 150 mg  150 mg Oral Daily Alma Friendly, MD   150 mg at 05/28/17 0919  . Chlorhexidine Gluconate Cloth 2 % PADS 6 each  6 each Topical Once Michael Boston, MD      . diphenhydrAMINE (BENADRYL) 12.5 MG/5ML elixir 12.5-25 mg  12.5-25 mg Oral Q6H PRN Wofford, Drew A, RPH      . enoxaparin (LOVENOX) injection 40 mg  40 mg Subcutaneous Q24H Rayburn, Kelly A, PA-C   40 mg at 05/28/17 0925  . famotidine (PEPCID) tablet 20 mg  20 mg Oral Daily [name] Cobia, RPH   20 mg at 05/28/17 0919  . fentaNYL (SUBLIMAZE) injection 12.5 mcg  12.5 mcg Intravenous Q2H PRN Rayburn, Floyce Stakes, PA-C      . Gerhardt's butt cream   Topical TID Kirby-Graham, Karsten Fells, NP      . guaiFENesin-dextromethorphan (ROBITUSSIN DM) 100-10 MG/5ML syrup 10 mL  10 mL Oral Q4H PRN Michael Boston, MD      . hydrocortisone (ANUSOL-HC) 2.5 % rectal cream 1 application  1 application Topical QID PRN Michael Boston, MD      . hydrocortisone cream 1 % 1 application  1 application Topical TID PRN Michael Boston, MD      . levothyroxine (SYNTHROID, LEVOTHROID) tablet 88 mcg  88 mcg Oral QAC breakfast [name] Cobia, RPH   88 mcg at 05/28/17 0919  . lip balm (CARMEX) ointment 1 application  1 application Topical BID Michael Boston, MD   1 application at 32/35/57 [phone]  .  magic mouthwash  15 mL Oral QID PRN Michael Boston, MD      . menthol-cetylpyridinium (CEPACOL) lozenge 3 mg  1 lozenge Oral PRN Michael Boston, MD      . methocarbamol (ROBAXIN) 1,000 mg in dextrose 5 % 50 mL IVPB  1,000 mg Intravenous Q6H PRN Michael Boston, MD      . metoCLOPramide (REGLAN) injection 10 mg  10 mg Intravenous Q6H PRN Michael Boston, MD      . metoprolol tartrate (LOPRESSOR) injection 5 mg  5 mg Intravenous Q6H PRN Alma Friendly, MD   5 mg at 05/27/17 2139  . ondansetron (ZOFRAN) injection 4 mg  4 mg Intravenous Q6H PRN Michael Boston, MD   4 mg at 05/27/17 1752   Or  . ondansetron (ZOFRAN) 8 mg in sodium chloride 0.9 % 50 mL IVPB  8  mg Intravenous Q6H PRN Michael Boston, MD      . phenol (CHLORASEPTIC) mouth spray 1-2 spray  1-2 spray Mouth/Throat PRN Michael Boston, MD      . polyvinyl alcohol (LIQUIFILM TEARS) 1.4 % ophthalmic solution 1 drop  1 drop Both Eyes PRN [name], DO   1 drop at 05/25/17 [phone]  . potassium chloride SA (K-DUR,KLOR-CON) CR tablet 40 mEq  40 mEq Oral BID Jill Alexanders, PA-C   40 mEq at 05/28/17 0919  . prochlorperazine (COMPAZINE) injection 5-10 mg  5-10 mg Intravenous Q4H PRN Michael Boston, MD      . risperiDONE (RISPERDAL) tablet 0.5 mg  0.5 mg Oral QHS Alma Friendly, MD   0.5 mg at 05/27/17 2128     Discharge Medications: Please see discharge summary for a list of discharge medications.  Relevant Imaging Results:  Relevant Lab Results:   Additional Information SS#: [id number]  Lia Hopping, LCSW

## 2017-05-28 NOTE — Discharge Summary (Addendum)
Physician Discharge Summary  [name] YHC:[id number] DOB: 1932-05-06 DOA: 05/21/2017  PCP: [name], MD  Admit date: 05/21/2017 Discharge date: 05/29/2017  Admitted From: Home Disposition:  SNF  Discharge Condition:Stable CODE STATUS:FULL Diet recommendation: Heart Healthy  Brief/Interim Summary:  [name] a 84 y.o.femalewith medical history significant ofAdvanced dementia, HTN, Hypothyroidism, HLD, bipolar, presents to the ED with c/o abd pain, N/V. Symptoms ongoing for the past 24 hours. Cannot keep anything down by mouth. Guardian service was concerned and told caregiver to bring patient to ED. In the ED, found to have UTI, but more significantly found to have high grade SBO secondary to incarcerated umbilical hernia. Gen surg was consulted, Dr. Johney Maine who performed emergent surgery on 05/22/17. Today, pt denies any new complaints. Advanced diet to soft by Gen surg. She is tolerating it.  Surgery has cleared her for discharge. She is going to be discharged to skilled nursing facility today.  Following problems were addressed during hospitalization:  Incarcerated ventral hernia/ Hx of crohns dsz Status post ex lap, partial omentectomy, primary repair of ventral hernia, small bowel resection x1 on 05/22/2017 by Dr. Johney Maine Advanced to soft diet, s/p NGT as per Gen surg Recommended to take soft, low fiber diet for 1 to 2 weeks more.  AKI Resolved s/p IVF Likely prerenal due to above Daily BMP  UTI UA with large leukocytes, few bacteria,> 50 WBC clumps UC showed >100,000 E.Coli and >100,000 proteus mirabilis Completed IV Rocephin X 7 days  Acute blood loss anemia Likely from post op Baseline around 10  Hypokalemia Supplemented today.  Check BMP in a week.  Hypertension Stable Restart home amlodipine  Hx of atrial flutter Rate controlled Anticoagulation was recently held/stopped at ALF on 05/07/17 unsure if this was temp  Hypothyroidism Continue  Synthroid    Discharge Diagnoses:  Principal Problem:   Incarcerated ventral hernia s/p SB resection & primary hernia repair 05/22/2017 Active Problems:   OBESITY   OBSTRUCTIVE SLEEP APNEA   Essential hypertension   GASTROESOPHAGEAL REFLUX DISEASE   Memory impairment   Dementia with behavioral disturbance   AKI (acute kidney injury) ([name])   Acute lower UTI   SBO (small bowel obstruction) s/p SB resection 05/22/2017   Ventral hernia with bowel obstruction   Bipolar affective [facility])    Discharge Instructions  Discharge Instructions    Diet - low sodium heart healthy   Complete by:  As directed    Take soft, low fiber diet for 1 to 2 weeks   Discharge instructions   Complete by:  As directed    1) Follow up with surgery as an outpatient .  Name and number of the provider has been attached. 2) Follow up with PCP as an outpatient in a week.  Do a CBC and BMP test during the follow-up. 3) Take soft, low fiber diet for 1 to 2 weeks.   Increase activity slowly   Complete by:  As directed      Allergies as of 05/29/2017      Reactions   Prednisone Other (See Comments)   Patient had psychiatric episode with hallucinations. Caused 3.5 week long hospitalization & loss of memory    Carbamazepine Nausea Only, Other (See Comments)   Tegretol; dizziness, blurred vision, nausea, headache, insomnia   Codeine Nausea And Vomiting   Demerol [meperidine] Nausea And Vomiting   Diclofenac Sodium Other (See Comments)   severe headache   Fentanyl Nausea And Vomiting, Other (See Comments)   IV  Fentanyl; caused nausea and vomiting Fentanyl patch; caused chest pain, HBP, with second patch experienced vertigo and nausea   Fluoxetine Hcl Nausea Only, Other (See Comments)   dizziness, blurred vision, nausea, headache, insomnia   Gentamicin    Other reaction(s): EYE IRRITATION   Hydromorphone Hcl Nausea And Vomiting   Ibuprofen Other (See Comments)   Caused ulcers and colitis   Indomethacin  Nausea Only, Other (See Comments)   dizziness, blurred vision, nausea, headache, insomnia   Lisinopril Cough   Meperidine Hcl Nausea And Vomiting   Morphine Nausea And Vomiting   Pregabalin Other (See Comments)   severe restless legs, insomnia   Propranolol Hcl Nausea Only, Other (See Comments)   dizziness, blurred vision, nausea, headache, insomnia   Refresh Lacri-lube [eye Lubricant] Swelling   Caused swelling, redness, hard crust on eyelids    Ropinirole Hcl Other (See Comments)   severe restless legs and insomnia   Sulfamethoxazole Nausea Only, Other (See Comments)   gas, loose stools   White Petrolatum-mineral Oil  [aquaphilic]    Other reaction(s): SWELLING/EDEMA   Bacitracin Hives, Itching, Rash   redness      Medication List    TAKE these medications   amLODipine 5 MG tablet Commonly known as:  NORVASC TAKE 1 TABLET (5 MG TOTAL) BY MOUTH DAILY.   anastrozole 1 MG tablet Commonly known as:  ARIMIDEX Take 1 tablet (1 mg total) by mouth daily.   aspirin 81 MG EC tablet Take 81 mg by mouth.   atorvastatin 40 MG tablet Commonly known as:  LIPITOR TAKE 1 TABLET DAILY   buPROPion 150 MG 24 hr tablet Commonly known as:  WELLBUTRIN XL Take 150 mg by mouth daily.   dicyclomine 10 MG capsule Commonly known as:  BENTYL Take 1 capsule (10 mg total) by mouth 3 (three) times daily before meals. What changed:  when to take this   donepezil 5 MG tablet Commonly known as:  ARICEPT Take 5 mg by mouth at bedtime.   levothyroxine 88 MCG tablet Commonly known as:  SYNTHROID, LEVOTHROID Take 1 tablet (88 mcg total) by mouth daily before breakfast. Pt needs an appointment for further refills   memantine 10 MG tablet Commonly known as:  NAMENDA TAKE 1 TABLET (10 MG TOTAL) BY MOUTH TWO  TIMES DAILY.   multivitamin with minerals Tabs tablet Take 1 tablet by mouth daily.   potassium chloride SA 20 MEQ tablet Commonly known as:  K-DUR,KLOR-CON Take 1 tablet (20 mEq total)  by mouth daily.   risperiDONE 1 MG tablet Commonly known as:  RISPERDAL Take 0.5 tablets (0.5 mg total) at bedtime by mouth.   SYSTANE BALANCE 0.6 % Soln Generic drug:  Propylene Glycol Place 1 drop into both eyes daily.      Follow-up Information    Michael Boston, MD. Schedule an appointment as soon as possible for a visit.   Specialty:  General Surgery Why:  in 2-3 weeks for post-operative follow up. Contact information: [address] [place] [address] [phone]        [name], MD. Schedule an appointment as soon as possible for a visit in 1 week(s).   Specialty:  Family Medicine Contact information: [address] [place] [address] [phone]          Allergies  Allergen Reactions  . Prednisone Other (See Comments)    Patient had psychiatric episode with hallucinations. Caused 3.5 week long hospitalization & loss of memory   .  Carbamazepine Nausea Only and Other (See Comments)    Tegretol; dizziness, blurred vision, nausea, headache, insomnia  . Codeine Nausea And Vomiting  . Demerol [Meperidine] Nausea And Vomiting  . Diclofenac Sodium Other (See Comments)    severe headache  . Fentanyl Nausea And Vomiting and Other (See Comments)    IV Fentanyl; caused nausea and vomiting Fentanyl patch; caused chest pain, HBP, with second patch experienced vertigo and nausea  . Fluoxetine Hcl Nausea Only and Other (See Comments)    dizziness, blurred vision, nausea, headache, insomnia  . Gentamicin     Other reaction(s): EYE IRRITATION  . Hydromorphone Hcl Nausea And Vomiting  . Ibuprofen Other (See Comments)    Caused ulcers and colitis  . Indomethacin Nausea Only and Other (See Comments)    dizziness, blurred vision, nausea, headache, insomnia  . Lisinopril Cough  . Meperidine Hcl Nausea And Vomiting  . Morphine Nausea And Vomiting  . Pregabalin Other (See Comments)    severe restless legs, insomnia  . Propranolol Hcl  Nausea Only and Other (See Comments)    dizziness, blurred vision, nausea, headache, insomnia  . Refresh Lacri-Lube [Eye Lubricant] Swelling    Caused swelling, redness, hard crust on eyelids   . Ropinirole Hcl Other (See Comments)    severe restless legs and insomnia  . Sulfamethoxazole Nausea Only and Other (See Comments)    gas, loose stools  . White Petrolatum-Mineral Oil  [Aquaphilic]     Other reaction(s): SWELLING/EDEMA  . Bacitracin Hives, Itching and Rash    redness    Consultations:  General surgery   Procedures/Studies: Ct Abdomen Pelvis Wo Contrast  Result Date: 05/22/2017 CLINICAL DATA:  Acute onset of nausea and vomiting. Generalized abdominal distention. Leukocytosis. EXAM: CT ABDOMEN AND PELVIS WITHOUT CONTRAST TECHNIQUE: Multidetector CT imaging of the abdomen and pelvis was performed following the standard protocol without IV contrast. COMPARISON:  CT of the abdomen and pelvis from 03/12/2015 FINDINGS: Lower chest: Patchy bibasilar airspace opacities may reflect atelectasis or possibly mild infection. The visualized portions of the mediastinum are grossly unremarkable. Hepatobiliary: The liver is unremarkable in appearance. The patient is status post cholecystectomy, with clips noted at the gallbladder fossa. The common bile duct remains normal in caliber. Pancreas: The pancreas is within normal limits. Spleen: The spleen is unremarkable in appearance. Adrenals/Urinary Tract: The adrenal glands are grossly unremarkable in appearance. Bilateral renal stones measure up to 1.8 cm in size. There is a large 1.5 x 1.0 cm stone at the right renal pelvis, just above the right ureteropelvic junction. No significant hydronephrosis is seen. Bilateral renal parapelvic cysts are noted. No perinephric stranding is appreciated. Stomach/Bowel: There is dilatation of small-bowel loops to 3.7 cm in maximal diameter, with trace associated free fluid and mild mesenteric edema. There appears to be  a transition point at the level of a moderate periumbilical hernia, which contains a loop of mid ileum. There is no definite evidence of strangulation. The colon is largely decompressed. Scattered diverticulosis is noted along the sigmoid colon, without evidence of diverticulitis. The stomach is largely filled with fluid and air, and is unremarkable in appearance. Vascular/Lymphatic: Diffuse calcification is seen along the abdominal aorta and its branches. The abdominal aorta is otherwise grossly unremarkable. The inferior vena cava is grossly unremarkable. No retroperitoneal lymphadenopathy is seen. No pelvic sidewall lymphadenopathy is identified. Reproductive: The bladder is mildly distended and grossly unremarkable. The uterus is unremarkable in appearance. No suspicious adnexal masses are seen. The ovaries are grossly symmetric. Other:  No additional soft tissue abnormalities are seen. Musculoskeletal: No acute osseous abnormalities are identified. The patient is status post decompression at the lower lumbar spine. The visualized musculature is unremarkable in appearance. IMPRESSION: 1. Small-bowel obstruction due to herniation of the mid ileum at a moderate periumbilical hernia, with dilatation of small-bowel loops to 3.7 cm in maximal diameter, trace free fluid and mild mesenteric edema. No definite evidence of strangulation at this time. 2. Patchy bibasilar airspace opacities may reflect atelectasis or possibly mild infection. 3. Bilateral renal stones measure up to 1.8 cm in size. Large 1.5 x 1.0 cm stone at the right renal pelvis, just above the right ureteropelvic junction. No significant hydronephrosis seen. 4. Bilateral renal parapelvic cysts noted. 5. Scattered diverticulosis along the sigmoid colon, without evidence of diverticulitis. Aortic Atherosclerosis (ICD10-I70.0). Electronically Signed   By: Garald Balding M.D.   On: 05/22/2017 00:57      Subjective:   Discharge Exam: Vitals:    05/28/17 2022 05/29/17 0548  BP: (!) 140/48 (!) 147/56  Pulse: 96 90  Resp: 14 15  Temp: 99.1 F (37.3 C) 98.6 F (37 C)  SpO2: 90% 97%   Vitals:   05/28/17 1700 05/28/17 1911 05/28/17 2022 05/29/17 0548  BP:   (!) 140/48 (!) 147/56  Pulse:   96 90  Resp:   14 15  Temp: 98.9 F (37.2 C) 99.9 F (37.7 C) 99.1 F (37.3 C) 98.6 F (37 C)  TempSrc: Oral Axillary Oral Oral  SpO2:   90% 97%  Weight:        General: Pt is alert, awake, not in acute distress, generalized weakness Cardiovascular: RRR, S1/S2 +, no rubs, no gallops Respiratory: CTA bilaterally, no wheezing, no rhonchi Abdominal: Soft, NT, ND, bowel sounds +, surgical incision covered with dressing Extremities: no edema, no cyanosis    The results of significant diagnostics from this hospitalization (including imaging, microbiology, ancillary and laboratory) are listed below for reference.     Microbiology: Recent Results (from the past 240 hour(s))  Urine culture     Status: Abnormal   Collection Time: 05/21/17 11:32 PM  Result Value Ref Range Status   Specimen Description   Final    URINE, CATHETERIZED Performed at [facility] [address], [place], [place] [address]    Special Requests   Final    NONE Performed at [facility], [place] [address], [place], [place] [address]    Culture (A)  Final    >=100,000 COLONIES/mL ESCHERICHIA COLI >=100,000 COLONIES/mL PROTEUS MIRABILIS    Report Status 05/25/2017 FINAL  Final   Organism ID, Bacteria ESCHERICHIA COLI (A)  Final   Organism ID, Bacteria PROTEUS MIRABILIS (A)  Final      Susceptibility   Escherichia coli - MIC*    AMPICILLIN 8 SENSITIVE Sensitive     CEFAZOLIN <=4 SENSITIVE Sensitive     CEFTRIAXONE <=1 SENSITIVE Sensitive     CIPROFLOXACIN >=4 RESISTANT Resistant     GENTAMICIN <=1 SENSITIVE Sensitive     IMIPENEM <=0.25 SENSITIVE Sensitive     NITROFURANTOIN <=16 SENSITIVE Sensitive     TRIMETH/SULFA  <=20 SENSITIVE Sensitive     AMPICILLIN/SULBACTAM 4 SENSITIVE Sensitive     PIP/TAZO <=4 SENSITIVE Sensitive     * >=100,000 COLONIES/mL ESCHERICHIA COLI   Proteus mirabilis - MIC*    AMPICILLIN <=2 SENSITIVE Sensitive     CEFAZOLIN <=4 SENSITIVE Sensitive     CEFTRIAXONE <=1 SENSITIVE Sensitive  CIPROFLOXACIN <=0.25 SENSITIVE Sensitive     GENTAMICIN <=1 SENSITIVE Sensitive     IMIPENEM 1 SENSITIVE Sensitive     NITROFURANTOIN 128 RESISTANT Resistant     TRIMETH/SULFA <=20 SENSITIVE Sensitive     AMPICILLIN/SULBACTAM <=2 SENSITIVE Sensitive     PIP/TAZO <=4 SENSITIVE Sensitive     * >=100,000 COLONIES/mL PROTEUS MIRABILIS  MRSA PCR Screening     Status: None   Collection Time: 05/22/17  3:04 PM  Result Value Ref Range Status   MRSA by PCR NEGATIVE NEGATIVE Final    Comment:        The GeneXpert MRSA Assay (FDA approved for NASAL specimens only), is one component of a comprehensive MRSA colonization surveillance program. It is not intended to diagnose MRSA infection nor to guide or monitor treatment for MRSA infections. Performed at [facility], [place] [address] [place], [place] [address]      Labs: BNP (last 3 results) No results for input(s): BNP in the last 8760 hours. Basic Metabolic Panel: Recent Labs  Lab 05/25/17 0331 05/26/17 0336 05/27/17 0250 05/28/17 0315 05/29/17 0524  NA 144 142 139 142 143  K 2.8* 3.3* 3.6 3.1* 4.0  CL 106 105 102 104 109  CO2 27 25 26 26 24   GLUCOSE 143* 111* 113* 132* 96  BUN 10 9 9 8 8   CREATININE 0.68 0.70 0.70 0.79 0.68  CALCIUM 8.2* 8.2* 8.6* 8.7* 8.4*   Liver Function Tests: No results for input(s): AST, ALT, ALKPHOS, BILITOT, PROT, ALBUMIN in the last 168 hours. No results for input(s): LIPASE, AMYLASE in the last 168 hours. No results for input(s): AMMONIA in the last 168 hours. CBC: Recent Labs  Lab 05/25/17 0331 05/26/17 0336 05/27/17 0501 05/28/17 0315 05/29/17 0524  WBC 6.4 8.0 9.1 7.9  7.5  NEUTROABS 3.7 4.9 6.0 5.4 4.9  HGB 8.8* 9.0* 9.3* 8.9* 8.0*  HCT 30.4* 31.2* 30.7* 30.1* 26.8*  MCV 80.6 80.2 78.5 78.6 78.6  PLT 279 219 291 312 267   Cardiac Enzymes: No results for input(s): CKTOTAL, CKMB, CKMBINDEX, TROPONINI in the last 168 hours. BNP: Invalid input(s): POCBNP CBG: No results for input(s): GLUCAP in the last 168 hours. D-Dimer No results for input(s): DDIMER in the last 72 hours. Hgb A1c No results for input(s): HGBA1C in the last 72 hours. Lipid Profile No results for input(s): CHOL, HDL, LDLCALC, TRIG, CHOLHDL, LDLDIRECT in the last 72 hours. Thyroid function studies No results for input(s): TSH, T4TOTAL, T3FREE, THYROIDAB in the last 72 hours.  Invalid input(s): FREET3 Anemia work up No results for input(s): VITAMINB12, FOLATE, FERRITIN, TIBC, IRON, RETICCTPCT in the last 72 hours. Urinalysis    Component Value Date/Time   COLORURINE AMBER (A) 05/21/2017 2253   APPEARANCEUR CLOUDY (A) 05/21/2017 2253   LABSPEC 1.024 05/21/2017 2253   PHURINE 5.0 05/21/2017 2253   GLUCOSEU NEGATIVE 05/21/2017 2253   GLUCOSEU NEGATIVE 07/22/2008 1105   HGBUR LARGE (A) 05/21/2017 2253   BILIRUBINUR NEGATIVE 05/21/2017 2253   KETONESUR 5 (A) 05/21/2017 2253   PROTEINUR 100 (A) 05/21/2017 2253   UROBILINOGEN 0.2 11/19/2014 1430   NITRITE NEGATIVE 05/21/2017 2253   LEUKOCYTESUR LARGE (A) 05/21/2017 2253   Sepsis Labs Invalid input(s): PROCALCITONIN,  WBC,  LACTICIDVEN Microbiology Recent Results (from the past 240 hour(s))  Urine culture     Status: Abnormal   Collection Time: 05/21/17 11:32 PM  Result Value Ref Range Status   Specimen Description   Final    URINE, CATHETERIZED  Performed at [facility], [place] [address] [place], [place] [address]    Special Requests   Final    NONE Performed at [facility], [place] [address] [place], [place] [address]    Culture (A)  Final    >=100,000 COLONIES/mL ESCHERICHIA  COLI >=100,000 COLONIES/mL PROTEUS MIRABILIS    Report Status 05/25/2017 FINAL  Final   Organism ID, Bacteria ESCHERICHIA COLI (A)  Final   Organism ID, Bacteria PROTEUS MIRABILIS (A)  Final      Susceptibility   Escherichia coli - MIC*    AMPICILLIN 8 SENSITIVE Sensitive     CEFAZOLIN <=4 SENSITIVE Sensitive     CEFTRIAXONE <=1 SENSITIVE Sensitive     CIPROFLOXACIN >=4 RESISTANT Resistant     GENTAMICIN <=1 SENSITIVE Sensitive     IMIPENEM <=0.25 SENSITIVE Sensitive     NITROFURANTOIN <=16 SENSITIVE Sensitive     TRIMETH/SULFA <=20 SENSITIVE Sensitive     AMPICILLIN/SULBACTAM 4 SENSITIVE Sensitive     PIP/TAZO <=4 SENSITIVE Sensitive     * >=100,000 COLONIES/mL ESCHERICHIA COLI   Proteus mirabilis - MIC*    AMPICILLIN <=2 SENSITIVE Sensitive     CEFAZOLIN <=4 SENSITIVE Sensitive     CEFTRIAXONE <=1 SENSITIVE Sensitive     CIPROFLOXACIN <=0.25 SENSITIVE Sensitive     GENTAMICIN <=1 SENSITIVE Sensitive     IMIPENEM 1 SENSITIVE Sensitive     NITROFURANTOIN 128 RESISTANT Resistant     TRIMETH/SULFA <=20 SENSITIVE Sensitive     AMPICILLIN/SULBACTAM <=2 SENSITIVE Sensitive     PIP/TAZO <=4 SENSITIVE Sensitive     * >=100,000 COLONIES/mL PROTEUS MIRABILIS  MRSA PCR Screening     Status: None   Collection Time: 05/22/17  3:04 PM  Result Value Ref Range Status   MRSA by PCR NEGATIVE NEGATIVE Final    Comment:        The GeneXpert MRSA Assay (FDA approved for NASAL specimens only), is one component of a comprehensive MRSA colonization surveillance program. It is not intended to diagnose MRSA infection nor to guide or monitor treatment for MRSA infections. Performed at [facility], [place] [address] [place], [place] [address]     Please note: You were cared for by a hospitalist during your hospital stay. Once you are discharged, your primary care physician will handle any further medical issues. Please note that NO REFILLS for any discharge medications will  be authorized once you are discharged, as it is imperative that you return to your primary care physician (or establish a relationship with a primary care physician if you do not have one) for your post hospital discharge needs so that they can reassess your need for medications and monitor your lab values.    Time coordinating discharge: 40 minutes  SIGNED:   Shelly Coss, MD  [facility] 05/29/2017, 8:13 AM Pager [phone]  If 7PM-7AM, please contact night-coverage [email or web address] Password [email or web address]

## 2017-05-28 NOTE — Progress Notes (Signed)
CSW called patient [name] this am to discuss physician plan to d/c patient today. LG informed CSW she was not aware the patient was being discharge today and requested to talk with the patient nurse and physician. CSW received a call later and LG  with a verbal list of facility options-[facility] was only option at the time.  CSW informed [name] [facility], [facility] and [facility] have all declined patient. LG reports she was not interested in the patient going to [facility] and wanted to wait to hear back from [place] [facility] [place]. CSW explain to LG the patient information was under review at the facility and the facility stop accepting patient at 4:00 pm. CSW informed patient LG since the patient has been discharge by the physician they would need to choose another facility where a bed was available. CSW explain that [facility] had reviewed patient clinicals and offered a bed as well.   Patient LG requested to speak to her legal team about the discharge. She reports she was told 3 days ago by the physician and nursing staff  the patient would transition to Med Surge floor before transitioning home and states the discharge has been misguided information and was not given enough time to make a decision.   Patient LG has requested to speak to [place] supervisor. CSW provided her with CSW Surveyor, quantity Zack Brooks contact information.   CSW called CSW Surveyor, quantity Zack to inform him about the sitaution and LG concerns.He informed CSW he would talk with the LG but also have CM discuss the appeal process with the LG.   CSW received a call from [facility] guardian stating she could not reach [place] and she had this Probation officer on a recorded line. The LG stated she was not told the patient needed "rehab" but "skill care". CSW explained to LG the terms are used interchangeably. CSW attempted to explain the medicare appeal process as well. . She persisted to record CSW and states she was not given  accurate information throughout the day and was not given enough time to choose a rehab option for the patient.  CSW informed patient LG-that she would need talk with assistant director Nathaniel Man about any further concerns.   CSW received call from [facility] indicating they will be able to accept patient but would not be able to accept today since the patient was accepted after 4:00pm.  CM spoke to [place] via phone to explain the Medicare appeal process and sent over information.   CSW discussed with CSW Surveyor, quantity and states he would follow up with the patient LG.   Patient will d/c in the a.m.  Nurse and Physician  informed.    Kathrin Greathouse, Latanya Presser, MSW Clinical Social Worker  [phone] 05/28/2017  4:09 PM

## 2017-05-28 NOTE — Progress Notes (Signed)
Physical Therapy Treatment Patient Details Name: [name] MRN: [id number] DOB: Jul 15, 1932 Today's Date: 05/28/2017    History of Present Illness 84 yo female admitted with incarcerated ventral hernia. S/P SB resection and hernia repair 05/22/17. hx of dementia, bipolar, obesity, fibromyalgia    PT Comments    Pt admitted with above diagnosis. Pt currently with functional limitations due to the deficits listed below (see PT Problem List).  Pt able to transfer to chair today but very fatigued after mobility;  VS-- HR 97--109          SpO2= 91--95--93% --pt [place] was on however O2 tubing was disconnected; left on RA, RN aware           RR  18--33--22 (3/4 DOE  With bed to chair transfer) Will continue PT POC, pt is very cooperative with PT, pleased to be OOB; she is a retired Museum/gallery exhibitions officer   Pt will benefit from skilled PT to increase their independence and safety with mobility to allow discharge to the venue listed below.     Follow Up Recommendations  SNF     Equipment Recommendations  None recommended by PT    Recommendations for Other Services       Precautions / Restrictions Precautions Precautions: Fall Precaution Comments: abdominal surgery.  incontinent Restrictions Weight Bearing Restrictions: No    Mobility  Bed Mobility Overal bed mobility: Needs Assistance Bed Mobility: Supine to Sit     Supine to sit: +2 for physical assistance;+2 for safety/equipment;HOB elevated;Mod assist     General bed mobility comments: partial roll to left, assist with trunk and LEs, cues to assist self, incr time needed  Transfers Overall transfer level: Needs assistance Equipment used: 2 person hand held assist Transfers: Sit to/from Stand;Stand Pivot Transfers Sit to Stand: Mod assist;+2 safety/equipment;+2 physical assistance Stand pivot transfers: Mod assist;Max assist;+2 physical assistance;+2 safety/equipment       General transfer comment: assist to stabilize in standing  and to perform pivot to chair; difficulty advancing LEs to take pivotal steps  Ambulation/Gait             General Gait Details: NT today d/t fatigue, elevated RR   Stairs             Wheelchair Mobility    Modified Rankin (Stroke Patients Only)       Balance Overall balance assessment: Needs assistance Sitting-balance support: Bilateral upper extremity supported;Feet supported Sitting balance-Leahy Scale: Poor Sitting balance - Comments: repeated posterior LOB Postural control: Posterior lean Standing balance support: Bilateral upper extremity supported;During functional activity Standing balance-Leahy Scale: Zero Standing balance comment: reliant on UE support and external assist                            Cognition Arousal/Alertness: Awake/alert Behavior During Therapy: WFL for tasks assessed/performed Overall Cognitive Status: History of cognitive impairments - at baseline                                 General Comments: alert, answers questions appropriately, sitter private  present      Exercises General Exercises - Lower Extremity Ankle Circles/Pumps: AROM;Both;10 reps    General Comments        Pertinent Vitals/Pain Pain Assessment: No/denies pain    Home Living  Prior Function            PT Goals (current goals can now be found in the care plan section) Acute Rehab PT Goals Patient Stated Goal: none stated PT Goal Formulation: Patient unable to participate in goal setting Time For Goal Achievement: 06/07/17 Potential to Achieve Goals: Good Progress towards PT goals: Progressing toward goals    Frequency    Min 2X/week      PT Plan Current plan remains appropriate;Frequency needs to be updated    Co-evaluation              AM-PAC PT "6 Clicks" Daily Activity  Outcome Measure  Difficulty turning over in bed (including adjusting bedclothes, sheets and blankets)?:  Unable Difficulty moving from lying on back to sitting on the side of the bed? : Unable Difficulty sitting down on and standing up from a chair with arms (e.g., wheelchair, bedside commode, etc,.)?: Unable Help needed moving to and from a bed to chair (including a wheelchair)?: Total Help needed walking in hospital room?: Total Help needed climbing 3-5 steps with a railing? : Total 6 Click Score: 6    End of Session Equipment Utilized During Treatment: Gait belt Activity Tolerance: Patient limited by fatigue Patient left: in chair;with call bell/phone within reach;with chair alarm set;with nursing/sitter in room   PT Visit Diagnosis: Muscle weakness (generalized) (M62.81);Difficulty in walking, not elsewhere classified (R26.2)     Time: 8588-5027 PT Time Calculation (min) (ACUTE ONLY): 19 min  Charges:  $Therapeutic Activity: 8-22 mins                    G CodesKenyon Ana, PT Pager: [phone] 05/28/2017    Kenyon Ana 05/28/2017, 11:09 AM

## 2017-05-28 NOTE — Progress Notes (Signed)
   05/28/17 2022  Vitals  Temp 99.1 F (37.3 C)  Temp Source Oral  BP (!) 140/48  MAP (mmHg) 75  BP Location Right Arm  BP Method Automatic  Patient Position (if appropriate) Lying  Pulse Rate 96  Pulse Rate Source Monitor  Resp 14  Oxygen Therapy  SpO2 90 %  O2 Device Room Air (2L/mn of O2 applied. O2 sats now 95-96)

## 2017-05-28 NOTE — Progress Notes (Signed)
Spoke with Lelon Frohlich legal guardian concerning IM letter faxed to [phone]. Explained that she will need to sign and return to office.  Lelon Frohlich states that she do not have an appeal against the doctor, but against the hospital. IM was explained to [place]. Explained to Lelon Frohlich that this CM is will call the director about this case. Lelon Frohlich states that she will have a dinner to attend between 6-9 PM, she need someone to call her prior to that, because she will not be available until after 9. Spoke with Zach,AD of [place] concerning this case, who will follow up.

## 2017-05-28 NOTE — Progress Notes (Signed)
Patient responded to tactile stimuli during position change.

## 2017-05-28 NOTE — Progress Notes (Signed)
Report called to RN for new room 1527. Audree Bane, guardian on this patient, was also called and made aware of room change. Attempt was made to call daughter [name] as well.

## 2017-05-28 NOTE — Progress Notes (Addendum)
CSW called to talk with patient Legal Guardian Webb Silversmith to discuss SNF placement options. CSW waiting for call.   CSW discussed with w/ Webb Silversmith the patient has recommendation for SNF placement. She is agreeable for the patient to transition to SNF.   Kathrin Greathouse, Latanya Presser, MSW Clinical Social Worker  [phone] 05/28/2017  11:01 AM

## 2017-05-28 NOTE — Progress Notes (Signed)
Patient arrived to room 1527  Escorted by her private Air cabin crew.  Patient is nonverbal, sleepy and unresponsive to tactile stimuli.

## 2017-05-29 DIAGNOSIS — Z853 Personal history of malignant neoplasm of breast: Secondary | ICD-10-CM | POA: Diagnosis not present

## 2017-05-29 DIAGNOSIS — N390 Urinary tract infection, site not specified: Secondary | ICD-10-CM | POA: Diagnosis not present

## 2017-05-29 DIAGNOSIS — Z111 Encounter for screening for respiratory tuberculosis: Secondary | ICD-10-CM | POA: Diagnosis not present

## 2017-05-29 DIAGNOSIS — R52 Pain, unspecified: Secondary | ICD-10-CM | POA: Diagnosis not present

## 2017-05-29 DIAGNOSIS — E039 Hypothyroidism, unspecified: Secondary | ICD-10-CM | POA: Diagnosis not present

## 2017-05-29 DIAGNOSIS — Z79899 Other long term (current) drug therapy: Secondary | ICD-10-CM | POA: Diagnosis not present

## 2017-05-29 DIAGNOSIS — Z48815 Encounter for surgical aftercare following surgery on the digestive system: Secondary | ICD-10-CM | POA: Diagnosis not present

## 2017-05-29 DIAGNOSIS — K56609 Unspecified intestinal obstruction, unspecified as to partial versus complete obstruction: Secondary | ICD-10-CM | POA: Diagnosis not present

## 2017-05-29 DIAGNOSIS — Z743 Need for continuous supervision: Secondary | ICD-10-CM | POA: Diagnosis not present

## 2017-05-29 DIAGNOSIS — Z0189 Encounter for other specified special examinations: Secondary | ICD-10-CM | POA: Diagnosis not present

## 2017-05-29 DIAGNOSIS — K581 Irritable bowel syndrome with constipation: Secondary | ICD-10-CM | POA: Diagnosis not present

## 2017-05-29 DIAGNOSIS — D649 Anemia, unspecified: Secondary | ICD-10-CM | POA: Diagnosis not present

## 2017-05-29 DIAGNOSIS — R279 Unspecified lack of coordination: Secondary | ICD-10-CM | POA: Diagnosis not present

## 2017-05-29 DIAGNOSIS — R2689 Other abnormalities of gait and mobility: Secondary | ICD-10-CM | POA: Diagnosis not present

## 2017-05-29 DIAGNOSIS — K5090 Crohn's disease, unspecified, without complications: Secondary | ICD-10-CM | POA: Diagnosis not present

## 2017-05-29 DIAGNOSIS — M6281 Muscle weakness (generalized): Secondary | ICD-10-CM | POA: Diagnosis not present

## 2017-05-29 DIAGNOSIS — B379 Candidiasis, unspecified: Secondary | ICD-10-CM | POA: Diagnosis not present

## 2017-05-29 DIAGNOSIS — E569 Vitamin deficiency, unspecified: Secondary | ICD-10-CM | POA: Diagnosis not present

## 2017-05-29 DIAGNOSIS — K56691 Other complete intestinal obstruction: Secondary | ICD-10-CM | POA: Diagnosis not present

## 2017-05-29 DIAGNOSIS — H04129 Dry eye syndrome of unspecified lacrimal gland: Secondary | ICD-10-CM | POA: Diagnosis not present

## 2017-05-29 DIAGNOSIS — F0391 Unspecified dementia with behavioral disturbance: Secondary | ICD-10-CM | POA: Diagnosis not present

## 2017-05-29 DIAGNOSIS — R278 Other lack of coordination: Secondary | ICD-10-CM | POA: Diagnosis not present

## 2017-05-29 DIAGNOSIS — I10 Essential (primary) hypertension: Secondary | ICD-10-CM | POA: Diagnosis not present

## 2017-05-29 DIAGNOSIS — K5900 Constipation, unspecified: Secondary | ICD-10-CM | POA: Diagnosis not present

## 2017-05-29 DIAGNOSIS — K436 Other and unspecified ventral hernia with obstruction, without gangrene: Secondary | ICD-10-CM | POA: Diagnosis not present

## 2017-05-29 DIAGNOSIS — D62 Acute posthemorrhagic anemia: Secondary | ICD-10-CM | POA: Diagnosis not present

## 2017-05-29 DIAGNOSIS — E785 Hyperlipidemia, unspecified: Secondary | ICD-10-CM | POA: Diagnosis not present

## 2017-05-29 DIAGNOSIS — K219 Gastro-esophageal reflux disease without esophagitis: Secondary | ICD-10-CM | POA: Diagnosis not present

## 2017-05-29 DIAGNOSIS — B3789 Other sites of candidiasis: Secondary | ICD-10-CM | POA: Diagnosis not present

## 2017-05-29 DIAGNOSIS — I2510 Atherosclerotic heart disease of native coronary artery without angina pectoris: Secondary | ICD-10-CM | POA: Diagnosis not present

## 2017-05-29 DIAGNOSIS — N179 Acute kidney failure, unspecified: Secondary | ICD-10-CM | POA: Diagnosis not present

## 2017-05-29 DIAGNOSIS — I4891 Unspecified atrial fibrillation: Secondary | ICD-10-CM | POA: Diagnosis not present

## 2017-05-29 LAB — CBC WITH DIFFERENTIAL/PLATELET
Basophils Absolute: 0 10*3/uL (ref 0.0–0.1)
Basophils Relative: 1 %
EOS ABS: 0.2 10*3/uL (ref 0.0–0.7)
Eosinophils Relative: 3 %
HEMATOCRIT: 26.8 % — AB (ref 36.0–46.0)
HEMOGLOBIN: 8 g/dL — AB (ref 12.0–15.0)
LYMPHS ABS: 1.6 10*3/uL (ref 0.7–4.0)
Lymphocytes Relative: 22 %
MCH: 23.5 pg — AB (ref 26.0–34.0)
MCHC: 29.9 g/dL — ABNORMAL LOW (ref 30.0–36.0)
MCV: 78.6 fL (ref 78.0–100.0)
MONO ABS: 0.8 10*3/uL (ref 0.1–1.0)
Monocytes Relative: 10 %
NEUTROS PCT: 64 %
Neutro Abs: 4.9 10*3/uL (ref 1.7–7.7)
Platelets: 267 10*3/uL (ref 150–400)
RBC: 3.41 MIL/uL — ABNORMAL LOW (ref 3.87–5.11)
RDW: 17.6 % — AB (ref 11.5–15.5)
WBC: 7.5 10*3/uL (ref 4.0–10.5)

## 2017-05-29 LAB — BASIC METABOLIC PANEL
Anion gap: 10 (ref 5–15)
BUN: 8 mg/dL (ref 6–20)
CALCIUM: 8.4 mg/dL — AB (ref 8.9–10.3)
CO2: 24 mmol/L (ref 22–32)
CREATININE: 0.68 mg/dL (ref 0.44–1.00)
Chloride: 109 mmol/L (ref 101–111)
GFR calc Af Amer: >60 mL/min (ref >60)
GLUCOSE: 96 mg/dL (ref 65–99)
Potassium: 4 mmol/L (ref 3.5–5.1)
SODIUM: 143 mmol/L (ref 135–145)

## 2017-05-29 MED ORDER — POTASSIUM CHLORIDE CRYS ER 20 MEQ PO TBCR: 20.0000 meq | EXTENDED_RELEASE_TABLET | Freq: Every day | ORAL | 0 refills | Status: DC

## 2017-05-29 NOTE — Progress Notes (Signed)
PROGRESS NOTE    [name]  NOM:[id number] DOB: Mar 28, 1932 DOA: 05/21/2017 PCP: [name], MD   Brief Narrative: Rebekah Birkhead Snowis a 84 y.o.femalewith medical history significant ofAdvanced dementia, HTN, Hypothyroidism, HLD, bipolar, presents to the ED with c/o abd pain, N/V. Symptoms ongoing for the past 24 hours. Cannot keep anything down by mouth. Guardian service was concerned and told caregiver to bring patient to ED. In the ED, found to have UTI, but more significantly found to have high grade SBO secondary to incarcerated umbilical hernia. Gen surg was consulted, Dr. Johney Maine who performed emergent surgery on 05/22/17. Today, pt denies any new complaints. Advanced diet tosoft by Gen surg. Rebekah is tolerating it.  Surgery has cleared her for discharge. Rebekah is going to be discharged to skilled nursing facility today.    Assessment & Plan:   Principal Problem:   Incarcerated ventral hernia s/p SB resection & primary hernia repair 05/22/2017 Active Problems:   OBESITY   OBSTRUCTIVE SLEEP APNEA   Essential hypertension   GASTROESOPHAGEAL REFLUX DISEASE   Memory impairment   Dementia with behavioral disturbance   AKI (acute kidney injury) ([place])   Acute lower UTI   SBO (small bowel obstruction) s/p SB resection 05/22/2017   Ventral hernia with bowel obstruction   Bipolar affective ([place])  Incarcerated ventral hernia/ Hx of crohns dsz Status post ex lap, partial omentectomy, primary repair of ventral hernia, small bowel resection x1 on 05/22/2017 by Dr. Johney Maine Advanced to soft diet,s/pNGT as per Gen surg Recommended to take soft, low fiber diet for 1 to 2 weeks more.  AKI Resolved s/p IVF Likely prerenal due to above Daily BMP  UTI UA with large leukocytes, few bacteria,> 50 WBC clumps UC showed >100,000 E.Coli and >100,000 proteus mirabilis CompletedIV Rocephin X 7 days  Acute blood loss anemia Likely from post op Baseline around  10  Hypokalemia Supplemented here.  Check BMP in a week.  Hypertension Stable Restart home amlodipine  Hx of atrial flutter Rate controlled Anticoagulation was recently held/stopped at ALF on 05/07/17 unsure if this was temp  Hypothyroidism Continue Synthroid   DVT prophylaxis: Lovenox Code Status: Full Family Communication: Discussed with daughter yesterday Disposition Plan: Skilled nursing facility today   Consultants: General surgery  Procedures: Laparotomy  Antimicrobials: None  Subjective:   Objective: Vitals:   05/28/17 1700 05/28/17 1911 05/28/17 2022 05/29/17 0548  BP:   (!) 140/48 (!) 147/56  Pulse:   96 90  Resp:   14 15  Temp: 98.9 F (37.2 C) 99.9 F (37.7 C) 99.1 F (37.3 C) 98.6 F (37 C)  TempSrc: Oral Axillary Oral Oral  SpO2:   90% 97%  Weight:        Intake/Output Summary (Last 24 hours) at 05/29/2017 9628 Last data filed at 05/28/2017 1600 Gross per 24 hour  Intake 640 ml  Output -  Net 640 ml   Filed Weights   05/22/17 0701  Weight: 83 kg (182 lb 15.7 oz)    Examination:  General exam: Appears calm and comfortable ,Not in distress,weak HEENT:PERRL,Oral mucosa moist, Ear/Nose normal on gross exam Respiratory system: Bilateral equal air entry, normal vesicular breath sounds, no wheezes or crackles  Cardiovascular system: S1 & S2 heard, RRR. No JVD, murmurs, rubs, gallops or clicks. No pedal edema. Gastrointestinal system: Abdomen is nondistended, soft and nontender. No organomegaly or masses felt. Normal bowel sounds heard. Abdominal surgical incision covered with dressing Central nervous system: Alert and oriented. No focal neurological  deficits. Extremities: No edema, no clubbing ,no cyanosis, distal peripheral pulses palpable. Skin: No rashes, lesions or ulcers,no icterus ,no pallor   Data Reviewed: I have personally reviewed following labs and imaging studies  CBC: Recent Labs  Lab 05/25/17 0331 05/26/17 0336  05/27/17 0501 05/28/17 0315 05/29/17 0524  WBC 6.4 8.0 9.1 7.9 7.5  NEUTROABS 3.7 4.9 6.0 5.4 4.9  HGB 8.8* 9.0* 9.3* 8.9* 8.0*  HCT 30.4* 31.2* 30.7* 30.1* 26.8*  MCV 80.6 80.2 78.5 78.6 78.6  PLT 279 219 291 312 226   Basic Metabolic Panel: Recent Labs  Lab 05/25/17 0331 05/26/17 0336 05/27/17 0250 05/28/17 0315 05/29/17 0524  NA 144 142 139 142 143  K 2.8* 3.3* 3.6 3.1* 4.0  CL 106 105 102 104 109  CO2 27 25 26 26 24   GLUCOSE 143* 111* 113* 132* 96  BUN 10 9 9 8 8   CREATININE 0.68 0.70 0.70 0.79 0.68  CALCIUM 8.2* 8.2* 8.6* 8.7* 8.4*   GFR: Estimated Creatinine Clearance: 56.5 mL/min (by C-G formula based on SCr of 0.68 mg/dL). Liver Function Tests: No results for input(s): AST, ALT, ALKPHOS, BILITOT, PROT, ALBUMIN in the last 168 hours. No results for input(s): LIPASE, AMYLASE in the last 168 hours. No results for input(s): AMMONIA in the last 168 hours. Coagulation Profile: No results for input(s): INR, PROTIME in the last 168 hours. Cardiac Enzymes: No results for input(s): CKTOTAL, CKMB, CKMBINDEX, TROPONINI in the last 168 hours. BNP (last 3 results) No results for input(s): PROBNP in the last 8760 hours. HbA1C: No results for input(s): HGBA1C in the last 72 hours. CBG: No results for input(s): GLUCAP in the last 168 hours. Lipid Profile: No results for input(s): CHOL, HDL, LDLCALC, TRIG, CHOLHDL, LDLDIRECT in the last 72 hours. Thyroid Function Tests: No results for input(s): TSH, T4TOTAL, FREET4, T3FREE, THYROIDAB in the last 72 hours. Anemia Panel: No results for input(s): VITAMINB12, FOLATE, FERRITIN, TIBC, IRON, RETICCTPCT in the last 72 hours. Sepsis Labs: No results for input(s): PROCALCITON, LATICACIDVEN in the last 168 hours.  Recent Results (from the past 240 hour(s))  Urine culture     Status: Abnormal   Collection Time: 05/21/17 11:32 PM  Result Value Ref Range Status   Specimen Description   Final    URINE, CATHETERIZED Performed at  [facility] [address] [place], [place] [address]    Special Requests   Final    NONE Performed at [facility], [place] [address] [place], [place] [address]    Culture (A)  Final    >=100,000 COLONIES/mL ESCHERICHIA COLI >=100,000 COLONIES/mL PROTEUS MIRABILIS    Report Status 05/25/2017 FINAL  Final   Organism ID, Bacteria ESCHERICHIA COLI (A)  Final   Organism ID, Bacteria PROTEUS MIRABILIS (A)  Final      Susceptibility   Escherichia coli - MIC*    AMPICILLIN 8 SENSITIVE Sensitive     CEFAZOLIN <=4 SENSITIVE Sensitive     CEFTRIAXONE <=1 SENSITIVE Sensitive     CIPROFLOXACIN >=4 RESISTANT Resistant     GENTAMICIN <=1 SENSITIVE Sensitive     IMIPENEM <=0.25 SENSITIVE Sensitive     NITROFURANTOIN <=16 SENSITIVE Sensitive     TRIMETH/SULFA <=20 SENSITIVE Sensitive     AMPICILLIN/SULBACTAM 4 SENSITIVE Sensitive     PIP/TAZO <=4 SENSITIVE Sensitive     * >=100,000 COLONIES/mL ESCHERICHIA COLI   Proteus mirabilis - MIC*    AMPICILLIN <=2 SENSITIVE Sensitive     CEFAZOLIN <=4 SENSITIVE  Sensitive     CEFTRIAXONE <=1 SENSITIVE Sensitive     CIPROFLOXACIN <=0.25 SENSITIVE Sensitive     GENTAMICIN <=1 SENSITIVE Sensitive     IMIPENEM 1 SENSITIVE Sensitive     NITROFURANTOIN 128 RESISTANT Resistant     TRIMETH/SULFA <=20 SENSITIVE Sensitive     AMPICILLIN/SULBACTAM <=2 SENSITIVE Sensitive     PIP/TAZO <=4 SENSITIVE Sensitive     * >=100,000 COLONIES/mL PROTEUS MIRABILIS  MRSA PCR Screening     Status: None   Collection Time: 05/22/17  3:04 PM  Result Value Ref Range Status   MRSA by PCR NEGATIVE NEGATIVE Final    Comment:        The GeneXpert MRSA Assay (FDA approved for NASAL specimens only), is one component of a comprehensive MRSA colonization surveillance program. It is not intended to diagnose MRSA infection nor to guide or monitor treatment for MRSA infections. Performed at [facility], [place] [address], [place], [place] [address]          Radiology Studies: No results found.      Scheduled Meds: . amLODipine  5 mg Oral Daily  . bisacodyl  10 mg Rectal Daily  . buPROPion  150 mg Oral Daily  . Chlorhexidine Gluconate Cloth  6 each Topical Once  . enoxaparin (LOVENOX) injection  40 mg Subcutaneous Q24H  . famotidine  20 mg Oral Daily  . Gerhardt's butt cream   Topical TID  . levothyroxine  88 mcg Oral QAC breakfast  . lip balm  1 application Topical BID  . risperiDONE  0.5 mg Oral QHS   Continuous Infusions: . methocarbamol (ROBAXIN)  IV    . ondansetron (ZOFRAN) IV       LOS: 7 days    Time spent: 25 mins.More than 50% of that time was spent in counseling and/or coordination of care.      Shelly Coss, MD [facility] Pager [phone]  If 7PM-7AM, please contact night-coverage [email or web address] Password [facility] 05/29/2017, 9:52 AM

## 2017-05-29 NOTE — Care Management Important Message (Signed)
Important Message  Patient Details  Name: CAEDYN TASSINARI MRN: [id number] Date of Birth: 10-Aug-1932   Medicare Important Message Given:  Yes    Kerin Salen 05/29/2017, 11:18 AMImportant Message  Patient Details  Name: VARNIKA BUTZ MRN: [id number] Date of Birth: 24-Jun-1932   Medicare Important Message Given:  Yes    Kerin Salen 05/29/2017, 11:18 AM

## 2017-05-29 NOTE — Clinical Social Work Placement (Addendum)
Updated D/C summary sent. PTAR arranged for 11:30 am pick Nurse given room and number to call report.   CLINICAL SOCIAL WORK PLACEMENT  NOTE  Date:  05/29/2017  Patient Details  Name: [name] MRN: [id number] Date of Birth: 03/23/1932  Clinical Social Work is seeking post-discharge placement for this patient at the [place] level of care (*CSW will initial, date and re-position this form in  chart as items are completed):  Yes   Patient/family provided with [place] Work Department's list of facilities offering this level of care within the geographic area requested by the patient (or if unable, by the patient's family).  Yes   Patient/family informed of their freedom to choose among providers that offer the needed level of care, that participate in Medicare, Medicaid or managed care program needed by the patient, have an available bed and are willing to accept the patient.  Yes   Patient/family informed of [facility]'s ownership interest in [facility] and [facility], as well as of the fact that they are under no obligation to receive care at these facilities.  PASRR submitted to EDS on       PASRR number received on       Existing PASRR number confirmed on       FL2 transmitted to all facilities in geographic area requested by pt/family on       FL2 transmitted to all facilities within larger geographic area on 05/28/17     Patient informed that his/her managed care company has contracts with or will negotiate with certain facilities, including the following:  [facility]     Yes   Patient/family informed of bed offers received.  Patient chooses bed at [facility]     Physician recommends and patient chooses bed at      Patient to be transferred to [facility] on 05/29/17.  Patient to be transferred to facility by PTAR     Patient family notified on 05/29/17 of transfer.  Name of family member notified:  Legal Guardian-Anne  Wiers     PHYSICIAN       Additional Comment:    _______________________________________________ Lia Hopping, LCSW 05/29/2017, 10:21 AM

## 2017-05-29 NOTE — Progress Notes (Signed)
Patient discharged to [facility] rehab. She left with PTAR in stable condition with all belongings. PTAR staff given packet of paperwork.

## 2017-06-02 DIAGNOSIS — K56691 Other complete intestinal obstruction: Secondary | ICD-10-CM | POA: Diagnosis not present

## 2017-06-02 DIAGNOSIS — M6281 Muscle weakness (generalized): Secondary | ICD-10-CM | POA: Diagnosis not present

## 2017-06-02 DIAGNOSIS — N179 Acute kidney failure, unspecified: Secondary | ICD-10-CM | POA: Diagnosis not present

## 2017-06-02 DIAGNOSIS — K436 Other and unspecified ventral hernia with obstruction, without gangrene: Secondary | ICD-10-CM | POA: Diagnosis not present

## 2017-06-02 DIAGNOSIS — Z48815 Encounter for surgical aftercare following surgery on the digestive system: Secondary | ICD-10-CM | POA: Diagnosis not present

## 2017-06-02 DIAGNOSIS — I10 Essential (primary) hypertension: Secondary | ICD-10-CM | POA: Diagnosis not present

## 2017-06-02 DIAGNOSIS — N390 Urinary tract infection, site not specified: Secondary | ICD-10-CM | POA: Diagnosis not present

## 2017-06-02 DIAGNOSIS — Z853 Personal history of malignant neoplasm of breast: Secondary | ICD-10-CM | POA: Diagnosis not present

## 2017-06-02 DIAGNOSIS — I2510 Atherosclerotic heart disease of native coronary artery without angina pectoris: Secondary | ICD-10-CM | POA: Diagnosis not present

## 2017-06-02 DIAGNOSIS — D649 Anemia, unspecified: Secondary | ICD-10-CM | POA: Diagnosis not present

## 2017-06-02 DIAGNOSIS — K5090 Crohn's disease, unspecified, without complications: Secondary | ICD-10-CM | POA: Diagnosis not present

## 2017-06-02 DIAGNOSIS — E785 Hyperlipidemia, unspecified: Secondary | ICD-10-CM | POA: Diagnosis not present

## 2017-06-04 DIAGNOSIS — I10 Essential (primary) hypertension: Secondary | ICD-10-CM | POA: Diagnosis not present

## 2017-06-04 DIAGNOSIS — D649 Anemia, unspecified: Secondary | ICD-10-CM | POA: Diagnosis not present

## 2017-06-04 DIAGNOSIS — N179 Acute kidney failure, unspecified: Secondary | ICD-10-CM | POA: Diagnosis not present

## 2017-06-04 DIAGNOSIS — K436 Other and unspecified ventral hernia with obstruction, without gangrene: Secondary | ICD-10-CM | POA: Diagnosis not present

## 2017-06-04 DIAGNOSIS — K5090 Crohn's disease, unspecified, without complications: Secondary | ICD-10-CM | POA: Diagnosis not present

## 2017-06-04 DIAGNOSIS — I2510 Atherosclerotic heart disease of native coronary artery without angina pectoris: Secondary | ICD-10-CM | POA: Diagnosis not present

## 2017-06-04 DIAGNOSIS — M6281 Muscle weakness (generalized): Secondary | ICD-10-CM | POA: Diagnosis not present

## 2017-06-04 DIAGNOSIS — N390 Urinary tract infection, site not specified: Secondary | ICD-10-CM | POA: Diagnosis not present

## 2017-06-04 DIAGNOSIS — K219 Gastro-esophageal reflux disease without esophagitis: Secondary | ICD-10-CM | POA: Diagnosis not present

## 2017-06-04 DIAGNOSIS — K56691 Other complete intestinal obstruction: Secondary | ICD-10-CM | POA: Diagnosis not present

## 2017-06-04 DIAGNOSIS — Z48815 Encounter for surgical aftercare following surgery on the digestive system: Secondary | ICD-10-CM | POA: Diagnosis not present

## 2017-06-04 DIAGNOSIS — B379 Candidiasis, unspecified: Secondary | ICD-10-CM | POA: Diagnosis not present

## 2017-06-06 DIAGNOSIS — D649 Anemia, unspecified: Secondary | ICD-10-CM | POA: Diagnosis not present

## 2017-06-06 DIAGNOSIS — K56691 Other complete intestinal obstruction: Secondary | ICD-10-CM | POA: Diagnosis not present

## 2017-06-06 DIAGNOSIS — Z48815 Encounter for surgical aftercare following surgery on the digestive system: Secondary | ICD-10-CM | POA: Diagnosis not present

## 2017-06-06 DIAGNOSIS — M6281 Muscle weakness (generalized): Secondary | ICD-10-CM | POA: Diagnosis not present

## 2017-06-06 DIAGNOSIS — B379 Candidiasis, unspecified: Secondary | ICD-10-CM | POA: Diagnosis not present

## 2017-06-06 DIAGNOSIS — K436 Other and unspecified ventral hernia with obstruction, without gangrene: Secondary | ICD-10-CM | POA: Diagnosis not present

## 2017-06-06 DIAGNOSIS — Z853 Personal history of malignant neoplasm of breast: Secondary | ICD-10-CM | POA: Diagnosis not present

## 2017-06-06 DIAGNOSIS — I10 Essential (primary) hypertension: Secondary | ICD-10-CM | POA: Diagnosis not present

## 2017-06-06 DIAGNOSIS — N390 Urinary tract infection, site not specified: Secondary | ICD-10-CM | POA: Diagnosis not present

## 2017-06-06 DIAGNOSIS — K5090 Crohn's disease, unspecified, without complications: Secondary | ICD-10-CM | POA: Diagnosis not present

## 2017-06-06 DIAGNOSIS — I2510 Atherosclerotic heart disease of native coronary artery without angina pectoris: Secondary | ICD-10-CM | POA: Diagnosis not present

## 2017-06-06 DIAGNOSIS — N179 Acute kidney failure, unspecified: Secondary | ICD-10-CM | POA: Diagnosis not present

## 2017-06-09 DIAGNOSIS — Z853 Personal history of malignant neoplasm of breast: Secondary | ICD-10-CM | POA: Diagnosis not present

## 2017-06-09 DIAGNOSIS — M6281 Muscle weakness (generalized): Secondary | ICD-10-CM | POA: Diagnosis not present

## 2017-06-09 DIAGNOSIS — Z48815 Encounter for surgical aftercare following surgery on the digestive system: Secondary | ICD-10-CM | POA: Diagnosis not present

## 2017-06-09 DIAGNOSIS — I2510 Atherosclerotic heart disease of native coronary artery without angina pectoris: Secondary | ICD-10-CM | POA: Diagnosis not present

## 2017-06-09 DIAGNOSIS — K436 Other and unspecified ventral hernia with obstruction, without gangrene: Secondary | ICD-10-CM | POA: Diagnosis not present

## 2017-06-09 DIAGNOSIS — D649 Anemia, unspecified: Secondary | ICD-10-CM | POA: Diagnosis not present

## 2017-06-09 DIAGNOSIS — I10 Essential (primary) hypertension: Secondary | ICD-10-CM | POA: Diagnosis not present

## 2017-06-09 DIAGNOSIS — N179 Acute kidney failure, unspecified: Secondary | ICD-10-CM | POA: Diagnosis not present

## 2017-06-09 DIAGNOSIS — K56691 Other complete intestinal obstruction: Secondary | ICD-10-CM | POA: Diagnosis not present

## 2017-06-09 DIAGNOSIS — N390 Urinary tract infection, site not specified: Secondary | ICD-10-CM | POA: Diagnosis not present

## 2017-06-09 DIAGNOSIS — K5090 Crohn's disease, unspecified, without complications: Secondary | ICD-10-CM | POA: Diagnosis not present

## 2017-06-09 DIAGNOSIS — K219 Gastro-esophageal reflux disease without esophagitis: Secondary | ICD-10-CM | POA: Diagnosis not present

## 2017-06-10 DIAGNOSIS — M6281 Muscle weakness (generalized): Secondary | ICD-10-CM | POA: Diagnosis not present

## 2017-06-10 DIAGNOSIS — I10 Essential (primary) hypertension: Secondary | ICD-10-CM | POA: Diagnosis not present

## 2017-06-10 DIAGNOSIS — K219 Gastro-esophageal reflux disease without esophagitis: Secondary | ICD-10-CM | POA: Diagnosis not present

## 2017-06-10 DIAGNOSIS — Z853 Personal history of malignant neoplasm of breast: Secondary | ICD-10-CM | POA: Diagnosis not present

## 2017-06-10 DIAGNOSIS — D649 Anemia, unspecified: Secondary | ICD-10-CM | POA: Diagnosis not present

## 2017-06-10 DIAGNOSIS — K5090 Crohn's disease, unspecified, without complications: Secondary | ICD-10-CM | POA: Diagnosis not present

## 2017-06-10 DIAGNOSIS — Z48815 Encounter for surgical aftercare following surgery on the digestive system: Secondary | ICD-10-CM | POA: Diagnosis not present

## 2017-06-10 DIAGNOSIS — N390 Urinary tract infection, site not specified: Secondary | ICD-10-CM | POA: Diagnosis not present

## 2017-06-10 DIAGNOSIS — K436 Other and unspecified ventral hernia with obstruction, without gangrene: Secondary | ICD-10-CM | POA: Diagnosis not present

## 2017-06-10 DIAGNOSIS — I2510 Atherosclerotic heart disease of native coronary artery without angina pectoris: Secondary | ICD-10-CM | POA: Diagnosis not present

## 2017-06-10 DIAGNOSIS — K56691 Other complete intestinal obstruction: Secondary | ICD-10-CM | POA: Diagnosis not present

## 2017-06-10 DIAGNOSIS — N179 Acute kidney failure, unspecified: Secondary | ICD-10-CM | POA: Diagnosis not present

## 2017-06-12 DIAGNOSIS — K436 Other and unspecified ventral hernia with obstruction, without gangrene: Secondary | ICD-10-CM | POA: Diagnosis not present

## 2017-06-12 DIAGNOSIS — D649 Anemia, unspecified: Secondary | ICD-10-CM | POA: Diagnosis not present

## 2017-06-12 DIAGNOSIS — R600 Localized edema: Secondary | ICD-10-CM | POA: Diagnosis not present

## 2017-06-16 DIAGNOSIS — I2510 Atherosclerotic heart disease of native coronary artery without angina pectoris: Secondary | ICD-10-CM | POA: Diagnosis not present

## 2017-06-16 DIAGNOSIS — K5090 Crohn's disease, unspecified, without complications: Secondary | ICD-10-CM | POA: Diagnosis not present

## 2017-06-16 DIAGNOSIS — I10 Essential (primary) hypertension: Secondary | ICD-10-CM | POA: Diagnosis not present

## 2017-06-16 DIAGNOSIS — Z48815 Encounter for surgical aftercare following surgery on the digestive system: Secondary | ICD-10-CM | POA: Diagnosis not present

## 2017-06-16 DIAGNOSIS — K56609 Unspecified intestinal obstruction, unspecified as to partial versus complete obstruction: Secondary | ICD-10-CM | POA: Diagnosis not present

## 2017-06-16 DIAGNOSIS — I4892 Unspecified atrial flutter: Secondary | ICD-10-CM | POA: Diagnosis not present

## 2017-06-20 DIAGNOSIS — I10 Essential (primary) hypertension: Secondary | ICD-10-CM | POA: Diagnosis not present

## 2017-06-20 DIAGNOSIS — I2510 Atherosclerotic heart disease of native coronary artery without angina pectoris: Secondary | ICD-10-CM | POA: Diagnosis not present

## 2017-06-20 DIAGNOSIS — K5090 Crohn's disease, unspecified, without complications: Secondary | ICD-10-CM | POA: Diagnosis not present

## 2017-06-20 DIAGNOSIS — K56609 Unspecified intestinal obstruction, unspecified as to partial versus complete obstruction: Secondary | ICD-10-CM | POA: Diagnosis not present

## 2017-06-20 DIAGNOSIS — I4892 Unspecified atrial flutter: Secondary | ICD-10-CM | POA: Diagnosis not present

## 2017-06-20 DIAGNOSIS — Z48815 Encounter for surgical aftercare following surgery on the digestive system: Secondary | ICD-10-CM | POA: Diagnosis not present

## 2017-06-24 DIAGNOSIS — I4892 Unspecified atrial flutter: Secondary | ICD-10-CM | POA: Diagnosis not present

## 2017-06-24 DIAGNOSIS — K56609 Unspecified intestinal obstruction, unspecified as to partial versus complete obstruction: Secondary | ICD-10-CM | POA: Diagnosis not present

## 2017-06-24 DIAGNOSIS — I10 Essential (primary) hypertension: Secondary | ICD-10-CM | POA: Diagnosis not present

## 2017-06-24 DIAGNOSIS — I2510 Atherosclerotic heart disease of native coronary artery without angina pectoris: Secondary | ICD-10-CM | POA: Diagnosis not present

## 2017-06-24 DIAGNOSIS — K5090 Crohn's disease, unspecified, without complications: Secondary | ICD-10-CM | POA: Diagnosis not present

## 2017-06-24 DIAGNOSIS — Z48815 Encounter for surgical aftercare following surgery on the digestive system: Secondary | ICD-10-CM | POA: Diagnosis not present

## 2017-06-25 DIAGNOSIS — I2510 Atherosclerotic heart disease of native coronary artery without angina pectoris: Secondary | ICD-10-CM | POA: Diagnosis not present

## 2017-06-25 DIAGNOSIS — K5090 Crohn's disease, unspecified, without complications: Secondary | ICD-10-CM | POA: Diagnosis not present

## 2017-06-25 DIAGNOSIS — I4892 Unspecified atrial flutter: Secondary | ICD-10-CM | POA: Diagnosis not present

## 2017-06-25 DIAGNOSIS — K56609 Unspecified intestinal obstruction, unspecified as to partial versus complete obstruction: Secondary | ICD-10-CM | POA: Diagnosis not present

## 2017-06-25 DIAGNOSIS — Z48815 Encounter for surgical aftercare following surgery on the digestive system: Secondary | ICD-10-CM | POA: Diagnosis not present

## 2017-06-25 DIAGNOSIS — I10 Essential (primary) hypertension: Secondary | ICD-10-CM | POA: Diagnosis not present

## 2017-06-30 DIAGNOSIS — K56609 Unspecified intestinal obstruction, unspecified as to partial versus complete obstruction: Secondary | ICD-10-CM | POA: Diagnosis not present

## 2017-06-30 DIAGNOSIS — Z48815 Encounter for surgical aftercare following surgery on the digestive system: Secondary | ICD-10-CM | POA: Diagnosis not present

## 2017-06-30 DIAGNOSIS — I2510 Atherosclerotic heart disease of native coronary artery without angina pectoris: Secondary | ICD-10-CM | POA: Diagnosis not present

## 2017-06-30 DIAGNOSIS — I4892 Unspecified atrial flutter: Secondary | ICD-10-CM | POA: Diagnosis not present

## 2017-06-30 DIAGNOSIS — I10 Essential (primary) hypertension: Secondary | ICD-10-CM | POA: Diagnosis not present

## 2017-06-30 DIAGNOSIS — K5090 Crohn's disease, unspecified, without complications: Secondary | ICD-10-CM | POA: Diagnosis not present

## 2017-07-02 DIAGNOSIS — I10 Essential (primary) hypertension: Secondary | ICD-10-CM | POA: Diagnosis not present

## 2017-07-02 DIAGNOSIS — I4892 Unspecified atrial flutter: Secondary | ICD-10-CM | POA: Diagnosis not present

## 2017-07-02 DIAGNOSIS — Z48815 Encounter for surgical aftercare following surgery on the digestive system: Secondary | ICD-10-CM | POA: Diagnosis not present

## 2017-07-02 DIAGNOSIS — K56609 Unspecified intestinal obstruction, unspecified as to partial versus complete obstruction: Secondary | ICD-10-CM | POA: Diagnosis not present

## 2017-07-02 DIAGNOSIS — K5090 Crohn's disease, unspecified, without complications: Secondary | ICD-10-CM | POA: Diagnosis not present

## 2017-07-02 DIAGNOSIS — I2510 Atherosclerotic heart disease of native coronary artery without angina pectoris: Secondary | ICD-10-CM | POA: Diagnosis not present

## 2017-07-04 DIAGNOSIS — I2510 Atherosclerotic heart disease of native coronary artery without angina pectoris: Secondary | ICD-10-CM | POA: Diagnosis not present

## 2017-07-04 DIAGNOSIS — K56609 Unspecified intestinal obstruction, unspecified as to partial versus complete obstruction: Secondary | ICD-10-CM | POA: Diagnosis not present

## 2017-07-04 DIAGNOSIS — K5090 Crohn's disease, unspecified, without complications: Secondary | ICD-10-CM | POA: Diagnosis not present

## 2017-07-04 DIAGNOSIS — I4892 Unspecified atrial flutter: Secondary | ICD-10-CM | POA: Diagnosis not present

## 2017-07-04 DIAGNOSIS — I10 Essential (primary) hypertension: Secondary | ICD-10-CM | POA: Diagnosis not present

## 2017-07-04 DIAGNOSIS — Z48815 Encounter for surgical aftercare following surgery on the digestive system: Secondary | ICD-10-CM | POA: Diagnosis not present

## 2017-07-07 DIAGNOSIS — K56609 Unspecified intestinal obstruction, unspecified as to partial versus complete obstruction: Secondary | ICD-10-CM | POA: Diagnosis not present

## 2017-07-07 DIAGNOSIS — I4892 Unspecified atrial flutter: Secondary | ICD-10-CM | POA: Diagnosis not present

## 2017-07-07 DIAGNOSIS — I10 Essential (primary) hypertension: Secondary | ICD-10-CM | POA: Diagnosis not present

## 2017-07-07 DIAGNOSIS — K5090 Crohn's disease, unspecified, without complications: Secondary | ICD-10-CM | POA: Diagnosis not present

## 2017-07-07 DIAGNOSIS — Z48815 Encounter for surgical aftercare following surgery on the digestive system: Secondary | ICD-10-CM | POA: Diagnosis not present

## 2017-07-07 DIAGNOSIS — I2510 Atherosclerotic heart disease of native coronary artery without angina pectoris: Secondary | ICD-10-CM | POA: Diagnosis not present

## 2017-07-09 DIAGNOSIS — Z48815 Encounter for surgical aftercare following surgery on the digestive system: Secondary | ICD-10-CM | POA: Diagnosis not present

## 2017-07-09 DIAGNOSIS — I10 Essential (primary) hypertension: Secondary | ICD-10-CM | POA: Diagnosis not present

## 2017-07-09 DIAGNOSIS — K5090 Crohn's disease, unspecified, without complications: Secondary | ICD-10-CM | POA: Diagnosis not present

## 2017-07-09 DIAGNOSIS — I4892 Unspecified atrial flutter: Secondary | ICD-10-CM | POA: Diagnosis not present

## 2017-07-09 DIAGNOSIS — K56609 Unspecified intestinal obstruction, unspecified as to partial versus complete obstruction: Secondary | ICD-10-CM | POA: Diagnosis not present

## 2017-07-09 DIAGNOSIS — I2510 Atherosclerotic heart disease of native coronary artery without angina pectoris: Secondary | ICD-10-CM | POA: Diagnosis not present

## 2017-07-16 DIAGNOSIS — I4892 Unspecified atrial flutter: Secondary | ICD-10-CM | POA: Diagnosis not present

## 2017-07-16 DIAGNOSIS — I2510 Atherosclerotic heart disease of native coronary artery without angina pectoris: Secondary | ICD-10-CM | POA: Diagnosis not present

## 2017-07-16 DIAGNOSIS — K5090 Crohn's disease, unspecified, without complications: Secondary | ICD-10-CM | POA: Diagnosis not present

## 2017-07-16 DIAGNOSIS — Z48815 Encounter for surgical aftercare following surgery on the digestive system: Secondary | ICD-10-CM | POA: Diagnosis not present

## 2017-07-16 DIAGNOSIS — K56609 Unspecified intestinal obstruction, unspecified as to partial versus complete obstruction: Secondary | ICD-10-CM | POA: Diagnosis not present

## 2017-07-16 DIAGNOSIS — I10 Essential (primary) hypertension: Secondary | ICD-10-CM | POA: Diagnosis not present

## 2017-07-17 DIAGNOSIS — K5090 Crohn's disease, unspecified, without complications: Secondary | ICD-10-CM | POA: Diagnosis not present

## 2017-07-17 DIAGNOSIS — Z48815 Encounter for surgical aftercare following surgery on the digestive system: Secondary | ICD-10-CM | POA: Diagnosis not present

## 2017-07-17 DIAGNOSIS — I4892 Unspecified atrial flutter: Secondary | ICD-10-CM | POA: Diagnosis not present

## 2017-07-17 DIAGNOSIS — K56609 Unspecified intestinal obstruction, unspecified as to partial versus complete obstruction: Secondary | ICD-10-CM | POA: Diagnosis not present

## 2017-07-17 DIAGNOSIS — I2510 Atherosclerotic heart disease of native coronary artery without angina pectoris: Secondary | ICD-10-CM | POA: Diagnosis not present

## 2017-07-17 DIAGNOSIS — I10 Essential (primary) hypertension: Secondary | ICD-10-CM | POA: Diagnosis not present

## 2017-07-21 ENCOUNTER — Ambulatory Visit: Payer: Medicare Other | Admitting: Hematology

## 2017-07-21 ENCOUNTER — Other Ambulatory Visit: Payer: Medicare Other

## 2017-07-22 DIAGNOSIS — K5090 Crohn's disease, unspecified, without complications: Secondary | ICD-10-CM | POA: Diagnosis not present

## 2017-07-22 DIAGNOSIS — I4892 Unspecified atrial flutter: Secondary | ICD-10-CM | POA: Diagnosis not present

## 2017-07-22 DIAGNOSIS — I10 Essential (primary) hypertension: Secondary | ICD-10-CM | POA: Diagnosis not present

## 2017-07-22 DIAGNOSIS — I2510 Atherosclerotic heart disease of native coronary artery without angina pectoris: Secondary | ICD-10-CM | POA: Diagnosis not present

## 2017-07-22 DIAGNOSIS — K56609 Unspecified intestinal obstruction, unspecified as to partial versus complete obstruction: Secondary | ICD-10-CM | POA: Diagnosis not present

## 2017-07-22 DIAGNOSIS — Z48815 Encounter for surgical aftercare following surgery on the digestive system: Secondary | ICD-10-CM | POA: Diagnosis not present

## 2017-07-23 ENCOUNTER — Telehealth: Payer: Self-pay | Admitting: Hematology

## 2017-07-23 DIAGNOSIS — I2510 Atherosclerotic heart disease of native coronary artery without angina pectoris: Secondary | ICD-10-CM | POA: Diagnosis not present

## 2017-07-23 DIAGNOSIS — I10 Essential (primary) hypertension: Secondary | ICD-10-CM | POA: Diagnosis not present

## 2017-07-23 DIAGNOSIS — K56609 Unspecified intestinal obstruction, unspecified as to partial versus complete obstruction: Secondary | ICD-10-CM | POA: Diagnosis not present

## 2017-07-23 DIAGNOSIS — Z48815 Encounter for surgical aftercare following surgery on the digestive system: Secondary | ICD-10-CM | POA: Diagnosis not present

## 2017-07-23 DIAGNOSIS — K5090 Crohn's disease, unspecified, without complications: Secondary | ICD-10-CM | POA: Diagnosis not present

## 2017-07-23 DIAGNOSIS — I4892 Unspecified atrial flutter: Secondary | ICD-10-CM | POA: Diagnosis not present

## 2017-07-23 NOTE — Telephone Encounter (Signed)
Called pt re appts that were added per 7/15 sch msg - left vm for pt and sent confirmation letter in the mail.

## 2017-07-29 DIAGNOSIS — K56609 Unspecified intestinal obstruction, unspecified as to partial versus complete obstruction: Secondary | ICD-10-CM | POA: Diagnosis not present

## 2017-07-29 DIAGNOSIS — I4892 Unspecified atrial flutter: Secondary | ICD-10-CM | POA: Diagnosis not present

## 2017-07-29 DIAGNOSIS — I10 Essential (primary) hypertension: Secondary | ICD-10-CM | POA: Diagnosis not present

## 2017-07-29 DIAGNOSIS — Z48815 Encounter for surgical aftercare following surgery on the digestive system: Secondary | ICD-10-CM | POA: Diagnosis not present

## 2017-07-29 DIAGNOSIS — K5090 Crohn's disease, unspecified, without complications: Secondary | ICD-10-CM | POA: Diagnosis not present

## 2017-07-29 DIAGNOSIS — I2510 Atherosclerotic heart disease of native coronary artery without angina pectoris: Secondary | ICD-10-CM | POA: Diagnosis not present

## 2017-07-30 DIAGNOSIS — K5090 Crohn's disease, unspecified, without complications: Secondary | ICD-10-CM | POA: Diagnosis not present

## 2017-07-30 DIAGNOSIS — K56609 Unspecified intestinal obstruction, unspecified as to partial versus complete obstruction: Secondary | ICD-10-CM | POA: Diagnosis not present

## 2017-07-30 DIAGNOSIS — I10 Essential (primary) hypertension: Secondary | ICD-10-CM | POA: Diagnosis not present

## 2017-07-30 DIAGNOSIS — I4892 Unspecified atrial flutter: Secondary | ICD-10-CM | POA: Diagnosis not present

## 2017-07-30 DIAGNOSIS — I2510 Atherosclerotic heart disease of native coronary artery without angina pectoris: Secondary | ICD-10-CM | POA: Diagnosis not present

## 2017-07-30 DIAGNOSIS — Z48815 Encounter for surgical aftercare following surgery on the digestive system: Secondary | ICD-10-CM | POA: Diagnosis not present

## 2017-08-06 DIAGNOSIS — Z48815 Encounter for surgical aftercare following surgery on the digestive system: Secondary | ICD-10-CM | POA: Diagnosis not present

## 2017-08-06 DIAGNOSIS — I4892 Unspecified atrial flutter: Secondary | ICD-10-CM | POA: Diagnosis not present

## 2017-08-06 DIAGNOSIS — I2510 Atherosclerotic heart disease of native coronary artery without angina pectoris: Secondary | ICD-10-CM | POA: Diagnosis not present

## 2017-08-06 DIAGNOSIS — K56609 Unspecified intestinal obstruction, unspecified as to partial versus complete obstruction: Secondary | ICD-10-CM | POA: Diagnosis not present

## 2017-08-06 DIAGNOSIS — K5090 Crohn's disease, unspecified, without complications: Secondary | ICD-10-CM | POA: Diagnosis not present

## 2017-08-06 DIAGNOSIS — I10 Essential (primary) hypertension: Secondary | ICD-10-CM | POA: Diagnosis not present

## 2017-08-07 DIAGNOSIS — I4892 Unspecified atrial flutter: Secondary | ICD-10-CM | POA: Diagnosis not present

## 2017-08-07 DIAGNOSIS — I2510 Atherosclerotic heart disease of native coronary artery without angina pectoris: Secondary | ICD-10-CM | POA: Diagnosis not present

## 2017-08-07 DIAGNOSIS — K56609 Unspecified intestinal obstruction, unspecified as to partial versus complete obstruction: Secondary | ICD-10-CM | POA: Diagnosis not present

## 2017-08-07 DIAGNOSIS — I10 Essential (primary) hypertension: Secondary | ICD-10-CM | POA: Diagnosis not present

## 2017-08-07 DIAGNOSIS — Z48815 Encounter for surgical aftercare following surgery on the digestive system: Secondary | ICD-10-CM | POA: Diagnosis not present

## 2017-08-07 DIAGNOSIS — K5090 Crohn's disease, unspecified, without complications: Secondary | ICD-10-CM | POA: Diagnosis not present

## 2017-08-11 DIAGNOSIS — I4892 Unspecified atrial flutter: Secondary | ICD-10-CM | POA: Diagnosis not present

## 2017-08-11 DIAGNOSIS — K5090 Crohn's disease, unspecified, without complications: Secondary | ICD-10-CM | POA: Diagnosis not present

## 2017-08-11 DIAGNOSIS — K56609 Unspecified intestinal obstruction, unspecified as to partial versus complete obstruction: Secondary | ICD-10-CM | POA: Diagnosis not present

## 2017-08-11 DIAGNOSIS — I10 Essential (primary) hypertension: Secondary | ICD-10-CM | POA: Diagnosis not present

## 2017-08-11 DIAGNOSIS — I2510 Atherosclerotic heart disease of native coronary artery without angina pectoris: Secondary | ICD-10-CM | POA: Diagnosis not present

## 2017-08-11 DIAGNOSIS — Z48815 Encounter for surgical aftercare following surgery on the digestive system: Secondary | ICD-10-CM | POA: Diagnosis not present

## 2017-08-12 DIAGNOSIS — D519 Vitamin B12 deficiency anemia, unspecified: Secondary | ICD-10-CM | POA: Diagnosis not present

## 2017-08-12 DIAGNOSIS — E039 Hypothyroidism, unspecified: Secondary | ICD-10-CM | POA: Diagnosis not present

## 2017-08-12 DIAGNOSIS — D649 Anemia, unspecified: Secondary | ICD-10-CM | POA: Diagnosis not present

## 2017-08-14 DIAGNOSIS — Z48815 Encounter for surgical aftercare following surgery on the digestive system: Secondary | ICD-10-CM | POA: Diagnosis not present

## 2017-08-14 DIAGNOSIS — K56609 Unspecified intestinal obstruction, unspecified as to partial versus complete obstruction: Secondary | ICD-10-CM | POA: Diagnosis not present

## 2017-08-14 DIAGNOSIS — R918 Other nonspecific abnormal finding of lung field: Secondary | ICD-10-CM | POA: Diagnosis not present

## 2017-08-14 DIAGNOSIS — I10 Essential (primary) hypertension: Secondary | ICD-10-CM | POA: Diagnosis not present

## 2017-08-14 DIAGNOSIS — K5090 Crohn's disease, unspecified, without complications: Secondary | ICD-10-CM | POA: Diagnosis not present

## 2017-08-14 DIAGNOSIS — I4892 Unspecified atrial flutter: Secondary | ICD-10-CM | POA: Diagnosis not present

## 2017-08-14 DIAGNOSIS — I2510 Atherosclerotic heart disease of native coronary artery without angina pectoris: Secondary | ICD-10-CM | POA: Diagnosis not present

## 2017-08-21 ENCOUNTER — Inpatient Hospital Stay: Payer: Medicare Other

## 2017-08-21 ENCOUNTER — Inpatient Hospital Stay: Payer: Medicare Other | Admitting: Hematology

## 2017-08-21 ENCOUNTER — Telehealth: Payer: Self-pay | Admitting: Hematology

## 2017-08-21 NOTE — Telephone Encounter (Signed)
Appts scheduled letter/calendar mailed per 8/15 sch msg

## 2017-08-25 ENCOUNTER — Emergency Department (HOSPITAL_COMMUNITY): Payer: Medicare Other

## 2017-08-25 ENCOUNTER — Inpatient Hospital Stay (HOSPITAL_COMMUNITY)
Admission: EM | Admit: 2017-08-25 | Discharge: 2017-09-07 | DRG: 388 | Disposition: E | Payer: Medicare Other | Source: Skilled Nursing Facility | Attending: Internal Medicine | Admitting: Internal Medicine

## 2017-08-25 ENCOUNTER — Other Ambulatory Visit: Payer: Self-pay

## 2017-08-25 ENCOUNTER — Encounter (HOSPITAL_COMMUNITY): Payer: Self-pay

## 2017-08-25 DIAGNOSIS — Z9049 Acquired absence of other specified parts of digestive tract: Secondary | ICD-10-CM | POA: Diagnosis not present

## 2017-08-25 DIAGNOSIS — E039 Hypothyroidism, unspecified: Secondary | ICD-10-CM | POA: Diagnosis present

## 2017-08-25 DIAGNOSIS — G2581 Restless legs syndrome: Secondary | ICD-10-CM | POA: Diagnosis present

## 2017-08-25 DIAGNOSIS — J9811 Atelectasis: Secondary | ICD-10-CM | POA: Diagnosis not present

## 2017-08-25 DIAGNOSIS — Z66 Do not resuscitate: Secondary | ICD-10-CM | POA: Diagnosis present

## 2017-08-25 DIAGNOSIS — E872 Acidosis: Secondary | ICD-10-CM | POA: Diagnosis present

## 2017-08-25 DIAGNOSIS — Z800 Family history of malignant neoplasm of digestive organs: Secondary | ICD-10-CM

## 2017-08-25 DIAGNOSIS — Z8261 Family history of arthritis: Secondary | ICD-10-CM

## 2017-08-25 DIAGNOSIS — I10 Essential (primary) hypertension: Secondary | ICD-10-CM | POA: Diagnosis present

## 2017-08-25 DIAGNOSIS — Z8673 Personal history of transient ischemic attack (TIA), and cerebral infarction without residual deficits: Secondary | ICD-10-CM

## 2017-08-25 DIAGNOSIS — F03918 Unspecified dementia, unspecified severity, with other behavioral disturbance: Secondary | ICD-10-CM | POA: Diagnosis present

## 2017-08-25 DIAGNOSIS — R112 Nausea with vomiting, unspecified: Secondary | ICD-10-CM | POA: Diagnosis not present

## 2017-08-25 DIAGNOSIS — K56609 Unspecified intestinal obstruction, unspecified as to partial versus complete obstruction: Principal | ICD-10-CM | POA: Diagnosis present

## 2017-08-25 DIAGNOSIS — R55 Syncope and collapse: Secondary | ICD-10-CM | POA: Diagnosis not present

## 2017-08-25 DIAGNOSIS — I129 Hypertensive chronic kidney disease with stage 1 through stage 4 chronic kidney disease, or unspecified chronic kidney disease: Secondary | ICD-10-CM | POA: Diagnosis present

## 2017-08-25 DIAGNOSIS — F0391 Unspecified dementia with behavioral disturbance: Secondary | ICD-10-CM | POA: Diagnosis present

## 2017-08-25 DIAGNOSIS — G4733 Obstructive sleep apnea (adult) (pediatric): Secondary | ICD-10-CM | POA: Diagnosis present

## 2017-08-25 DIAGNOSIS — Z8249 Family history of ischemic heart disease and other diseases of the circulatory system: Secondary | ICD-10-CM

## 2017-08-25 DIAGNOSIS — I2510 Atherosclerotic heart disease of native coronary artery without angina pectoris: Secondary | ICD-10-CM | POA: Diagnosis present

## 2017-08-25 DIAGNOSIS — Z8611 Personal history of tuberculosis: Secondary | ICD-10-CM

## 2017-08-25 DIAGNOSIS — Z515 Encounter for palliative care: Secondary | ICD-10-CM | POA: Diagnosis present

## 2017-08-25 DIAGNOSIS — Z79818 Long term (current) use of other agents affecting estrogen receptors and estrogen levels: Secondary | ICD-10-CM

## 2017-08-25 DIAGNOSIS — G9341 Metabolic encephalopathy: Secondary | ICD-10-CM | POA: Diagnosis present

## 2017-08-25 DIAGNOSIS — F319 Bipolar disorder, unspecified: Secondary | ICD-10-CM | POA: Diagnosis present

## 2017-08-25 DIAGNOSIS — Z7982 Long term (current) use of aspirin: Secondary | ICD-10-CM

## 2017-08-25 DIAGNOSIS — N390 Urinary tract infection, site not specified: Secondary | ICD-10-CM | POA: Diagnosis present

## 2017-08-25 DIAGNOSIS — K5649 Other impaction of intestine: Secondary | ICD-10-CM | POA: Diagnosis present

## 2017-08-25 DIAGNOSIS — R0602 Shortness of breath: Secondary | ICD-10-CM

## 2017-08-25 DIAGNOSIS — Z6832 Body mass index (BMI) 32.0-32.9, adult: Secondary | ICD-10-CM | POA: Diagnosis not present

## 2017-08-25 DIAGNOSIS — K219 Gastro-esophageal reflux disease without esophagitis: Secondary | ICD-10-CM | POA: Diagnosis present

## 2017-08-25 DIAGNOSIS — K631 Perforation of intestine (nontraumatic): Secondary | ICD-10-CM

## 2017-08-25 DIAGNOSIS — Z981 Arthrodesis status: Secondary | ICD-10-CM

## 2017-08-25 DIAGNOSIS — N179 Acute kidney failure, unspecified: Secondary | ICD-10-CM | POA: Diagnosis not present

## 2017-08-25 DIAGNOSIS — K5641 Fecal impaction: Secondary | ICD-10-CM

## 2017-08-25 DIAGNOSIS — N189 Chronic kidney disease, unspecified: Secondary | ICD-10-CM | POA: Diagnosis present

## 2017-08-25 DIAGNOSIS — E669 Obesity, unspecified: Secondary | ICD-10-CM | POA: Diagnosis present

## 2017-08-25 DIAGNOSIS — R1110 Vomiting, unspecified: Secondary | ICD-10-CM | POA: Diagnosis not present

## 2017-08-25 DIAGNOSIS — R0902 Hypoxemia: Secondary | ICD-10-CM | POA: Diagnosis not present

## 2017-08-25 DIAGNOSIS — E875 Hyperkalemia: Secondary | ICD-10-CM | POA: Diagnosis present

## 2017-08-25 DIAGNOSIS — Z79890 Hormone replacement therapy (postmenopausal): Secondary | ICD-10-CM

## 2017-08-25 DIAGNOSIS — Z853 Personal history of malignant neoplasm of breast: Secondary | ICD-10-CM

## 2017-08-25 DIAGNOSIS — E785 Hyperlipidemia, unspecified: Secondary | ICD-10-CM | POA: Diagnosis present

## 2017-08-25 LAB — CBC WITH DIFFERENTIAL/PLATELET
BASOS ABS: 0 10*3/uL (ref 0.0–0.1)
Basophils Relative: 0 %
Eosinophils Absolute: 0 10*3/uL (ref 0.0–0.7)
Eosinophils Relative: 0 %
HCT: 36.1 % (ref 36.0–46.0)
Hemoglobin: 10.7 g/dL — ABNORMAL LOW (ref 12.0–15.0)
LYMPHS ABS: 1.4 10*3/uL (ref 0.7–4.0)
Lymphocytes Relative: 19 %
MCH: 23.1 pg — ABNORMAL LOW (ref 26.0–34.0)
MCHC: 29.6 g/dL — ABNORMAL LOW (ref 30.0–36.0)
MCV: 77.8 fL — ABNORMAL LOW (ref 78.0–100.0)
MONO ABS: 0.3 10*3/uL (ref 0.1–1.0)
Monocytes Relative: 4 %
Neutro Abs: 5.9 10*3/uL (ref 1.7–7.7)
Neutrophils Relative %: 77 %
PLATELETS: 193 10*3/uL (ref 150–400)
RBC: 4.64 MIL/uL (ref 3.87–5.11)
RDW: 23.3 % — AB (ref 11.5–15.5)
WBC: 7.6 10*3/uL (ref 4.0–10.5)

## 2017-08-25 LAB — COMPREHENSIVE METABOLIC PANEL
ALK PHOS: 85 U/L (ref 38–126)
ALT: 18 U/L (ref 0–44)
AST: 54 U/L — AB (ref 15–41)
Albumin: 3.1 g/dL — ABNORMAL LOW (ref 3.5–5.0)
Anion gap: 10 (ref 5–15)
BILIRUBIN TOTAL: 1.4 mg/dL — AB (ref 0.3–1.2)
BUN: 19 mg/dL (ref 8–23)
CHLORIDE: 111 mmol/L (ref 98–111)
CO2: 23 mmol/L (ref 22–32)
CREATININE: 1.26 mg/dL — AB (ref 0.44–1.00)
Calcium: 7.6 mg/dL — ABNORMAL LOW (ref 8.9–10.3)
GFR calc non Af Amer: 38 mL/min — ABNORMAL LOW (ref >60)
GFR, EST AFRICAN AMERICAN: 44 mL/min — AB (ref >60)
Glucose, Bld: 108 mg/dL — ABNORMAL HIGH (ref 70–99)
Potassium: 5.3 mmol/L — ABNORMAL HIGH (ref 3.5–5.1)
Sodium: 144 mmol/L (ref 135–145)
Total Protein: 5.9 g/dL — ABNORMAL LOW (ref 6.5–8.1)

## 2017-08-25 LAB — URINALYSIS, ROUTINE W REFLEX MICROSCOPIC
BILIRUBIN URINE: NEGATIVE
Glucose, UA: NEGATIVE mg/dL
KETONES UR: 5 mg/dL — AB
Nitrite: NEGATIVE
PH: 5 (ref 5.0–8.0)
Protein, ur: 100 mg/dL — AB
SPECIFIC GRAVITY, URINE: 1.024 (ref 1.005–1.030)
WBC, UA: >50 WBC/hpf — ABNORMAL HIGH (ref 0–5)

## 2017-08-25 LAB — TSH: TSH: 3.935 u[IU]/mL (ref 0.350–4.500)

## 2017-08-25 LAB — I-STAT TROPONIN, ED: Troponin i, poc: 0.01 ng/mL (ref 0.00–0.08)

## 2017-08-25 LAB — LACTIC ACID, PLASMA
Lactic Acid, Venous: 2.2 mmol/L (ref 0.5–1.9)
Lactic Acid, Venous: 2.6 mmol/L (ref 0.5–1.9)

## 2017-08-25 LAB — I-STAT CG4 LACTIC ACID, ED: Lactic Acid, Venous: 1.71 mmol/L (ref 0.5–1.9)

## 2017-08-25 LAB — LIPASE, BLOOD: Lipase: 23 U/L (ref 11–51)

## 2017-08-25 LAB — PROCALCITONIN: PROCALCITONIN: 0.15 ng/mL

## 2017-08-25 MED ORDER — BISACODYL 10 MG RE SUPP: 10.0000 mg | Freq: Every day | RECTAL | Status: DC | PRN

## 2017-08-25 MED ORDER — ONDANSETRON HCL 4 MG/2ML IJ SOLN
4.0000 mg | Freq: Once | INTRAMUSCULAR | Status: AC
  Administered 2017-08-25: 4 mg via INTRAVENOUS
  Filled 2017-08-25: qty 2

## 2017-08-25 MED ORDER — SODIUM CHLORIDE 0.9 % IV SOLN
INTRAVENOUS | Status: DC | PRN
  Administered 2017-08-25: 1000 mL via INTRAVENOUS

## 2017-08-25 MED ORDER — SODIUM CHLORIDE 0.9 % IV BOLUS
1000.0000 mL | Freq: Once | INTRAVENOUS | Status: AC
  Administered 2017-08-25: 1000 mL via INTRAVENOUS

## 2017-08-25 MED ORDER — HEPARIN SODIUM (PORCINE) 5000 UNIT/ML IJ SOLN
5000.0000 [IU] | Freq: Three times a day (TID) | INTRAMUSCULAR | Status: DC
  Administered 2017-08-25 – 2017-08-26 (×2): 5000 [IU] via SUBCUTANEOUS
  Filled 2017-08-25 (×2): qty 1

## 2017-08-25 MED ORDER — ONDANSETRON HCL 4 MG PO TABS
4.0000 mg | ORAL_TABLET | Freq: Four times a day (QID) | ORAL | Status: DC | PRN
  Administered 2017-08-26: 4 mg via ORAL
  Filled 2017-08-25: qty 1

## 2017-08-25 MED ORDER — SODIUM CHLORIDE 0.9 % IV SOLN
INTRAVENOUS | Status: DC
  Administered 2017-08-25: 22:00:00 via INTRAVENOUS

## 2017-08-25 MED ORDER — LEVOTHYROXINE SODIUM 100 MCG IV SOLR: 44.0000 ug | Freq: Every day | INTRAVENOUS | Status: DC

## 2017-08-25 MED ORDER — HYDRALAZINE HCL 20 MG/ML IJ SOLN: 10.0000 mg | Freq: Four times a day (QID) | INTRAMUSCULAR | Status: DC | PRN

## 2017-08-25 MED ORDER — RISPERIDONE 0.25 MG PO TABS
0.5000 mg | ORAL_TABLET | Freq: Every day | ORAL | Status: DC
  Administered 2017-08-25: 0.5 mg via ORAL
  Filled 2017-08-25: qty 2

## 2017-08-25 MED ORDER — IOPAMIDOL (ISOVUE-300) INJECTION 61%
INTRAVENOUS | Status: AC
  Filled 2017-08-25: qty 100

## 2017-08-25 MED ORDER — IOPAMIDOL (ISOVUE-300) INJECTION 61%
100.0000 mL | Freq: Once | INTRAVENOUS | Status: AC | PRN
  Administered 2017-08-25: 75 mL via INTRAVENOUS

## 2017-08-25 MED ORDER — METRONIDAZOLE IN NACL 5-0.79 MG/ML-% IV SOLN
500.0000 mg | Freq: Three times a day (TID) | INTRAVENOUS | Status: DC
  Administered 2017-08-26: 500 mg via INTRAVENOUS
  Filled 2017-08-25: qty 100

## 2017-08-25 MED ORDER — ACETAMINOPHEN 325 MG PO TABS
650.0000 mg | ORAL_TABLET | Freq: Four times a day (QID) | ORAL | Status: DC | PRN
  Filled 2017-08-25: qty 2

## 2017-08-25 MED ORDER — SODIUM CHLORIDE 0.9 % IV SOLN: INTRAVENOUS | Status: DC

## 2017-08-25 MED ORDER — POLYVINYL ALCOHOL 1.4 % OP SOLN
1.0000 [drp] | Freq: Every day | OPHTHALMIC | Status: DC
  Filled 2017-08-25: qty 15

## 2017-08-25 MED ORDER — ACETAMINOPHEN 650 MG RE SUPP
650.0000 mg | Freq: Four times a day (QID) | RECTAL | Status: DC | PRN
  Administered 2017-08-26: 650 mg via RECTAL
  Filled 2017-08-25: qty 1

## 2017-08-25 MED ORDER — MILK AND MOLASSES ENEMA
1.0000 | Freq: Once | RECTAL | Status: AC
  Administered 2017-08-25: 250 mL via RECTAL
  Filled 2017-08-25: qty 250

## 2017-08-25 MED ORDER — SODIUM CHLORIDE 0.9 % IV SOLN
1.0000 g | Freq: Once | INTRAVENOUS | Status: AC
  Administered 2017-08-25: 1 g via INTRAVENOUS
  Filled 2017-08-25: qty 10

## 2017-08-25 MED ORDER — SODIUM CHLORIDE 0.9 % IV BOLUS
500.0000 mL | Freq: Once | INTRAVENOUS | Status: AC
  Administered 2017-08-25: 500 mL via INTRAVENOUS

## 2017-08-25 MED ORDER — ZINC OXIDE 11.3 % EX CREA
1.0000 "application " | TOPICAL_CREAM | Freq: Two times a day (BID) | CUTANEOUS | Status: DC
  Administered 2017-08-25: 1 via TOPICAL
  Filled 2017-08-25: qty 56

## 2017-08-25 MED ORDER — SODIUM CHLORIDE 0.9 % IV SOLN: 1.0000 g | INTRAVENOUS | Status: DC

## 2017-08-25 MED ORDER — METRONIDAZOLE IN NACL 5-0.79 MG/ML-% IV SOLN
500.0000 mg | Freq: Once | INTRAVENOUS | Status: AC
  Administered 2017-08-25: 500 mg via INTRAVENOUS
  Filled 2017-08-25: qty 100

## 2017-08-25 MED ORDER — ONDANSETRON HCL 4 MG/2ML IJ SOLN
4.0000 mg | Freq: Four times a day (QID) | INTRAMUSCULAR | Status: DC | PRN
  Administered 2017-08-26: 4 mg via INTRAVENOUS
  Filled 2017-08-25: qty 2

## 2017-08-25 NOTE — ED Notes (Signed)
While this RN and NT were changing pt, pt had an episode of emesis. Pt was cleaned and bed linen changed. Pt resting at this time

## 2017-08-25 NOTE — ED Triage Notes (Signed)
EMS reports from [facility] [place], Pt outside with caregiver who states Pt became distant with widened eyes and just stared, brought inside with vomiting. GP sent due to Hx of GI obstruction with similar symptoms. Hx of dementia baseline A&O X 1,  BP 136/61 HR 108 RR 18 Sp02 95 RA CBG 164

## 2017-08-25 NOTE — Consult Note (Signed)
General Surgery [facility]  Reason for Consult: portal venous gas, emhysema gastric wall  Referring Physician: Dr. Sherwood Gambler, [facility]  [name] is an 84 y.o. female.  HPI: Patient is an 84 yo WF who presents to ER from [facility] with 24 hr hx of nausea and emesis. Accompanied by caretaker who provides history.  Evaluation in ER includes CT scan which demonstrates moderate portal venous gas and emphysema in the wall of the stomach.  Patient does not appear toxic.  VS relatively stable.  WBC 7.6.  Afebrile.  Denies abdominal pain.  Patient found on CT with large fecal impaction causing obstruction.  Disimpacted in ER.  Admitted to hospitalist service.  Multiple prior abdominal surgical procedures including appendectomy, cholecystectomy, and ventral hernia repair with small bowel resection.  Past Medical History:  Diagnosis Date  . Bipolar affective ([place])    History of psychosis  . C. difficile colitis 11/26/2014  . C. difficile diarrhea   . CAD (coronary artery disease)    LAD 30% 2006  . Cerebrovascular disease   . Colonic polyp   . Crohn's disease ([place])   . Diverticulosis of colon   . Fibromyalgia   . GERD (gastroesophageal reflux disease)   . History of atrial flutter   . History of bronchitis   . History of headache   . History of hyperparathyroidism   . History of tuberculosis   . Hyperlipemia   . Hypertension   . Hypothyroidism   . IBS (irritable bowel syndrome)   . Memory disturbance   . 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 Obesity   . OSA (obstructive sleep apnea)    denies 12/07/15  . Osteopenia   . Other diseases of lung, not elsewhere classified   . Psychosis ([place])   . Radiation 05/2008   5000 cGy to left lower eyelid, parotid lymphatics and upper neck  . Restless legs syndrome (RLS)   . Syncope   . Tuberculosis     Past Surgical History:  Procedure Laterality Date  . APPENDECTOMY    . BLADDER SUSPENSION    .  BREAST LUMPECTOMY WITH RADIOACTIVE SEED LOCALIZATION Left 12/08/2015   Procedure: BREAST LUMPECTOMY WITH RADIOACTIVE SEED LOCALIZATION;  Surgeon: Autumn Messing III, MD;  Location: [facility];  Service: General;  Laterality: Left;  . CATARACT EXTRACTION Bilateral   . CHOLECYSTECTOMY    . eye lid surgery  04/04/2011   [facility] by Dr. Norlene Duel  . HEEL SPUR EXCISION    . INCISION AND DRAINAGE PERIRECTAL ABSCESS    . LAPAROTOMY N/A 05/22/2017   Procedure: EXPLORATORY LAPAROTOMY reduction repair incarcerated ventral wall hernia small bowel reseection;  Surgeon: Michael Boston, MD;  Location: [facility];  Service: General;  Laterality: N/A;  . LUMBAR LAMINECTOMY    . Moh';s surg left lower eye lid surgery and lid reconstruction surgery  2/10  . NASAL SINUS SURGERY Right    Right maxillary  . Parathyroid adenoma surgery  2007   Dr Armandina Gemma  . POSTERIOR CERVICAL FUSION/FORAMINOTOMY N/A 10/12/2013   Procedure: Cervical five-Thoracic two cervico-thoracic posterior fusion;  Surgeon: [name], MD;  Location: [facility];  Service: Neurosurgery;  Laterality: N/A;  . TONSILLECTOMY AND ADENOIDECTOMY      Family History  Problem Relation Age of Onset  . Heart failure Father   . Arthritis Mother   . Hypertension Mother   . Ulcers Mother   . Colon cancer Maternal Grandmother   .  Pulmonary embolism Unknown     Social History:  reports that she quit smoking about 57 years ago. Her smoking use included cigarettes. She quit after 6.00 years of use. She has never used smokeless tobacco. She reports that she does not drink alcohol or use drugs.  Allergies:  Allergies  Allergen Reactions  . Prednisone Other (See Comments)    Patient had psychiatric episode with hallucinations. Caused 3.5 week long hospitalization & loss of memory   . Carbamazepine Nausea Only and Other (See Comments)    Tegretol; dizziness, blurred vision, nausea, headache, insomnia  . Codeine Nausea And Vomiting  . Demerol [Meperidine] Nausea  And Vomiting  . Diclofenac Sodium Other (See Comments)    severe headache  . Fentanyl Nausea And Vomiting and Other (See Comments)    IV Fentanyl; caused nausea and vomiting Fentanyl patch; caused chest pain, HBP, with second patch experienced vertigo and nausea  . Fluoxetine Hcl Nausea Only and Other (See Comments)    dizziness, blurred vision, nausea, headache, insomnia  . Gentamicin     Other reaction(s): EYE IRRITATION  . Hydromorphone Hcl Nausea And Vomiting  . Ibuprofen Other (See Comments)    Caused ulcers and colitis  . Indomethacin Nausea Only and Other (See Comments)    dizziness, blurred vision, nausea, headache, insomnia  . Lisinopril Cough  . Meperidine Hcl Nausea And Vomiting  . Morphine Nausea And Vomiting  . Pregabalin Other (See Comments)    severe restless legs, insomnia  . Propranolol Hcl Nausea Only and Other (See Comments)    dizziness, blurred vision, nausea, headache, insomnia  . Refresh Lacri-Lube [Eye Lubricant] Swelling    Caused swelling, redness, hard crust on eyelids   . Ropinirole Hcl Other (See Comments)    severe restless legs and insomnia  . Sulfamethoxazole Nausea Only and Other (See Comments)    gas, loose stools  . White Petrolatum-Mineral Oil  [Aquaphilic]     Other reaction(s): SWELLING/EDEMA  . Bacitracin Hives, Itching and Rash    redness    Medications: I have reviewed the patient's current medications.  Results for orders placed or performed during the hospital encounter of 08/10/2017 (from the past 48 hour(s))  Comprehensive metabolic panel     Status: Abnormal   Collection Time: 08/24/2017  2:34 PM  Result Value Ref Range   Sodium 144 135 - 145 mmol/L   Potassium 5.3 (H) 3.5 - 5.1 mmol/L    Comment: SLIGHT HEMOLYSIS   Chloride 111 98 - 111 mmol/L   CO2 23 22 - 32 mmol/L   Glucose, Bld 108 (H) 70 - 99 mg/dL   BUN 19 8 - 23 mg/dL   Creatinine, Ser 1.26 (H) 0.44 - 1.00 mg/dL   Calcium 7.6 (L) 8.9 - 10.3 mg/dL   Total Protein 5.9  (L) 6.5 - 8.1 g/dL   Albumin 3.1 (L) 3.5 - 5.0 g/dL   AST 54 (H) 15 - 41 U/L   ALT 18 0 - 44 U/L   Alkaline Phosphatase 85 38 - 126 U/L   Total Bilirubin 1.4 (H) 0.3 - 1.2 mg/dL   GFR calc non Af Amer 38 (L) >60 mL/min   GFR calc Af Amer 44 (L) >60 mL/min    Comment: (NOTE) The eGFR has been calculated using the CKD EPI equation. This calculation has not been validated in all clinical situations. eGFR's persistently <60 mL/min signify possible Chronic Kidney Disease.    Anion gap 10 5 - 15    Comment: Performed  at [facility], [place] [address] [place], [place] [address]  Lipase, blood     Status: None   Collection Time: 09/04/2017  2:34 PM  Result Value Ref Range   Lipase 23 11 - 51 U/L    Comment: Performed at [facility], [place] [address] [place], [place] [address]  CBC with Differential     Status: Abnormal   Collection Time: 08/21/2017  2:34 PM  Result Value Ref Range   WBC 7.6 4.0 - 10.5 K/uL   RBC 4.64 3.87 - 5.11 MIL/uL   Hemoglobin 10.7 (L) 12.0 - 15.0 g/dL   HCT 36.1 36.0 - 46.0 %   MCV 77.8 (L) 78.0 - 100.0 fL   MCH 23.1 (L) 26.0 - 34.0 pg   MCHC 29.6 (L) 30.0 - 36.0 g/dL   RDW 23.3 (H) 11.5 - 15.5 %   Platelets 193 150 - 400 K/uL   Neutrophils Relative % 77 %   Lymphocytes Relative 19 %   Monocytes Relative 4 %   Eosinophils Relative 0 %   Basophils Relative 0 %   Neutro Abs 5.9 1.7 - 7.7 K/uL   Lymphs Abs 1.4 0.7 - 4.0 K/uL   Monocytes Absolute 0.3 0.1 - 1.0 K/uL   Eosinophils Absolute 0.0 0.0 - 0.7 K/uL   Basophils Absolute 0.0 0.0 - 0.1 K/uL   RBC Morphology POLYCHROMASIA PRESENT     Comment: ELLIPTOCYTES Performed at [facility], [place] [address], [place], [place] [address]   I-stat troponin, ED     Status: None   Collection Time: 08/13/2017  2:41 PM  Result Value Ref Range   Troponin i, poc 0.01 0.00 - 0.08 ng/mL   Comment 3            Comment: Due to the release kinetics of cTnI, a negative result  within the first hours of the onset of symptoms does not rule out myocardial infarction with certainty. If myocardial infarction is still suspected, repeat the test at appropriate intervals.   Urinalysis, Routine w reflex microscopic     Status: Abnormal   Collection Time: 08/13/2017  4:02 PM  Result Value Ref Range   Color, Urine AMBER (A) YELLOW    Comment: BIOCHEMICALS MAY BE AFFECTED BY COLOR   APPearance CLOUDY (A) CLEAR   Specific Gravity, Urine 1.024 1.005 - 1.030   pH 5.0 5.0 - 8.0   Glucose, UA NEGATIVE NEGATIVE mg/dL   Hgb urine dipstick SMALL (A) NEGATIVE   Bilirubin Urine NEGATIVE NEGATIVE   Ketones, ur 5 (A) NEGATIVE mg/dL   Protein, ur 100 (A) NEGATIVE mg/dL   Nitrite NEGATIVE NEGATIVE   Leukocytes, UA MODERATE (A) NEGATIVE   RBC / HPF >50 (H) 0 - 5 RBC/hpf   WBC, UA >50 (H) 0 - 5 WBC/hpf   Bacteria, UA MANY (A) NONE SEEN   WBC Clumps PRESENT    Mucus PRESENT    Hyaline Casts, UA PRESENT     Comment: Performed at [facility], [place] [address], [place], [place] [address]  I-Stat CG4 Lactic Acid, ED     Status: None   Collection Time: 08/13/2017  5:58 PM  Result Value Ref Range   Lactic Acid, Venous 1.71 0.5 - 1.9 mmol/L  Lactic acid, plasma     Status: Abnormal   Collection Time: 08/30/2017  6:30 PM  Result Value Ref Range   Lactic Acid, Venous 2.2 (HH) 0.5 - 1.9 mmol/L  Comment: CRITICAL RESULT CALLED TO, READ BACK BY AND VERIFIED WITH: CC MCNEELEY AT 2008 ON 08/23/2017 BY N.THOMPSON Performed at [facility], [place] [address], [place], [place] [address]     Dg Chest 2 View  Result Date: 08/23/2017 CLINICAL DATA:  Nausea, vomiting, confusion; history of hypertension, Merkel cell tumor LEFT eyelid, coronary artery disease, dementia EXAM: CHEST - 2 VIEW COMPARISON:  01/31/2017 FINDINGS: Upper normal size of cardiac silhouette. Mediastinal contours and pulmonary vascularity normal. Atherosclerotic calcification aorta. Scattered  subsegmental atelectasis in the mid to lower lungs. No definite infiltrate, pleural effusion or pneumothorax. Bones demineralized with evidence of prior cervicothoracic fusion. IMPRESSION: Bibasilar atelectasis. Electronically Signed   By: Lavonia Dana M.D.   On: 08/12/2017 14:59   Ct Abdomen Pelvis W Contrast  Result Date: 08/21/2017 CLINICAL DATA:  Altered mental status.  High-grade bowel obstruction EXAM: CT ABDOMEN AND PELVIS WITH CONTRAST TECHNIQUE: Multidetector CT imaging of the abdomen and pelvis was performed using the standard protocol following bolus administration of intravenous contrast. CONTRAST:  52m ISOVUE-300 IOPAMIDOL (ISOVUE-300) INJECTION 61% COMPARISON:  05/22/2017 FINDINGS: Lower chest:  21 mm low-density mass in the left breast. Hepatobiliary: Extensive portal venous gas.Cholecystectomy with chronic bile duct dilatation an apparent transition within the pancreatic head. Pancreas: Generalized atrophy Spleen: Unremarkable. Adrenals/Urinary Tract: Negative adrenals. 10 x 22 mm stone at the right UPJ, chronic. There is a branching calculus in the left lower pole. No hydronephrosis. Unremarkable bladder. Stomach/Bowel: There is gastric emphysema with portal venous gas reaching the liver. The small and large bowel is diffusely dilated, reaching a distended rectum containing stool and measuring up to 8 cm in diameter. Colonic diverticulosis. No pericecal inflammation seen Vascular/Lymphatic: Portal venous gas in the central venous structures. There is extensive atherosclerotic plaque with stenosis of SMA and celiac. Diffuse aortic and iliac stenosis. No mass or adenopathy. Reproductive:No acute finding Other: No ascites or pneumoperitoneum. Musculoskeletal: Advanced degenerative disease with scoliosis. L3-4 and L4-5 laminectomy. Critical Value/emergent results were called by telephone at the time of interpretation on 08/14/2017 at 5:06 pm to Dr. SSherwood Gambler, who verbally acknowledged these  results. IMPRESSION: 1. Gastric emphysema and extensive portal venous gas. Need clinical correlation to distinguish benign emphysema (as from patient vomiting) and gastric necrosis. 2. Diffusely dilated small and large bowel above a rectal impaction. Small and large bowel fluid levels, there could be a superimposed enteritis. 3. Advanced atherosclerosis. Electronically Signed   By: JMonte FantasiaM.D.   On: 09/02/2017 17:06    Review of Systems  Constitutional: Negative for chills and fever.  HENT: Negative.   Eyes: Negative.   Respiratory: Negative.   Cardiovascular: Negative.   Gastrointestinal: Positive for constipation, nausea and vomiting.  Genitourinary: Negative.   Musculoskeletal: Negative.   Skin: Negative.   Neurological: Positive for loss of consciousness.  Endo/Heme/Allergies: Negative.   Psychiatric/Behavioral: Positive for memory loss.   Blood pressure (!) 143/99, pulse (!) 109, temperature 98.7 F (37.1 C), temperature source Oral, resp. rate (!) 22, SpO2 92 %. Physical Exam  Constitutional: She appears well-developed and well-nourished. No distress.  HENT:  Head: Normocephalic and atraumatic.  Right Ear: External ear normal.  Left Ear: External ear normal.  Mouth/Throat: No oropharyngeal exudate.  Eyes: Pupils are equal, round, and reactive to light. Conjunctivae are normal. No scleral icterus.  Neck: Normal range of motion. Neck supple. No tracheal deviation present. No thyromegaly present.  Cardiovascular: Normal rate and normal heart sounds.  No murmur heard. Respiratory: Effort normal and  breath sounds normal. No respiratory distress. She has no wheezes.  GI: Soft. She exhibits distension. She exhibits no mass. There is tenderness (mild diffuse). There is no rebound and no guarding.  Quiet to auscultation  Musculoskeletal: Normal range of motion. She exhibits no edema, tenderness or deformity.  Neurological: She is alert.  Skin: Skin is warm and dry. She is not  diaphoretic.    Assessment/Plan: Portal venous gas and gastric wall emphysema  May be benign findings in the face of protracted nausea and emesis  No other acute process noted on abdominal CT scan  Recommend empiric broad abx coverage  Will monitor for 24 hours with you  Armandina Gemma, [facility] Office: [place] 08/18/2017, 8:15 PM

## 2017-08-25 NOTE — Progress Notes (Signed)
CRITICAL VALUE ALERT  Critical Value:  Lactic 2.6  Date & Time Notied:  2314  Provider Notified: K Schorr  Orders Received/Actions taken: Awaiting orders

## 2017-08-25 NOTE — ED Notes (Signed)
RN Fara Chute and RN Carley performed an enema on pt. Pt had an x-large BM but had a small episode of emesis during the process of RN assisting pt to turn.  The two RN's cleaned up the pt and changed the bed linen. Pt was take off purewick due to her heavy BM's.  Pt now cleaned and resting in bed.

## 2017-08-25 NOTE — ED Notes (Signed)
ED TO INPATIENT HANDOFF REPORT  Name/Age/Gender [name] 84 y.o. female  Code Status Code Status History    Date Active Date Inactive Code Status Order ID Comments User Context   05/22/2017 0210 05/29/2017 1540 Full Code [id number]  [name], DO ED   04/11/2015 1514 04/12/2015 1409 Full Code [id number]  [name], MD ED   02/16/2015 2342 02/17/2015 1909 Full Code [id number]  [name], MD ED   02/03/2015 1819 02/07/2015 2004 Full Code [id number]  [name], MD Inpatient   11/17/2013 2330 11/18/2013 2005 Full Code [id number]  [name], PA-C ED   10/12/2013 2112 10/20/2013 1440 Full Code [id number]  [name], MD Inpatient      Home/SNF/Other Nursing Home  Chief Complaint syncope  Level of Care/Admitting Diagnosis ED Disposition    ED Disposition Condition [place]: [place] [[id number]]  Level of Care: Stepdown [14]  Admit to SDU based on following criteria: Severe physiological/psychological symptoms:  Any diagnosis requiring assessment & intervention at least every 4 hours on an ongoing basis to obtain desired patient outcomes including stability and rehabilitation  Diagnosis: SBO (small bowel obstruction) [facility]) [[id number]]  Admitting Physician: RAI, Adell  Attending Physician: RAI, RIPUDEEP K [[id number]]  Estimated length of stay: past midnight tomorrow  Certification:: I certify this patient will need inpatient services for at least 2 midnights  PT Class (Do Not Modify): Inpatient [101]  PT Acc Code (Do Not Modify): Private [1]       Medical History Past Medical History:  Diagnosis Date  . Bipolar affective ([place])    History of psychosis  . C. difficile colitis 11/26/2014  . C. difficile diarrhea   . CAD (coronary artery disease)    LAD 30% 2006  . Cerebrovascular disease   . Colonic polyp   . Crohn's disease ([place])   . Diverticulosis of colon   . Fibromyalgia   . GERD (gastroesophageal reflux  disease)   . History of atrial flutter   . History of bronchitis   . History of headache   . History of hyperparathyroidism   . History of tuberculosis   . Hyperlipemia   . Hypertension   . Hypothyroidism   . IBS (irritable bowel syndrome)   . Memory disturbance   . 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 Obesity   . OSA (obstructive sleep apnea)    denies 12/07/15  . Osteopenia   . Other diseases of lung, not elsewhere classified   . Psychosis ([place])   . Radiation 05/2008   5000 cGy to left lower eyelid, parotid lymphatics and upper neck  . Restless legs syndrome (RLS)   . Syncope   . Tuberculosis     Allergies Allergies  Allergen Reactions  . Prednisone Other (See Comments)    Patient had psychiatric episode with hallucinations. Caused 3.5 week long hospitalization & loss of memory   . Carbamazepine Nausea Only and Other (See Comments)    Tegretol; dizziness, blurred vision, nausea, headache, insomnia  . Codeine Nausea And Vomiting  . Demerol [Meperidine] Nausea And Vomiting  . Diclofenac Sodium Other (See Comments)    severe headache  . Fentanyl Nausea And Vomiting and Other (See Comments)    IV Fentanyl; caused nausea and vomiting Fentanyl patch; caused chest pain, HBP, with second patch experienced vertigo and nausea  . Fluoxetine Hcl Nausea Only and Other (See Comments)    dizziness, blurred vision, nausea,  headache, insomnia  . Gentamicin     Other reaction(s): EYE IRRITATION  . Hydromorphone Hcl Nausea And Vomiting  . Ibuprofen Other (See Comments)    Caused ulcers and colitis  . Indomethacin Nausea Only and Other (See Comments)    dizziness, blurred vision, nausea, headache, insomnia  . Lisinopril Cough  . Meperidine Hcl Nausea And Vomiting  . Morphine Nausea And Vomiting  . Pregabalin Other (See Comments)    severe restless legs, insomnia  . Propranolol Hcl Nausea Only and Other (See Comments)    dizziness, blurred vision, nausea, headache, insomnia   . Refresh Lacri-Lube [Eye Lubricant] Swelling    Caused swelling, redness, hard crust on eyelids   . Ropinirole Hcl Other (See Comments)    severe restless legs and insomnia  . Sulfamethoxazole Nausea Only and Other (See Comments)    gas, loose stools  . White Petrolatum-Mineral Oil  [Aquaphilic]     Other reaction(s): SWELLING/EDEMA  . Bacitracin Hives, Itching and Rash    redness    IV Location/Drains/Wounds Patient Lines/Drains/Airways Status   Active Line/Drains/Airways    Name:   Placement date:   Placement time:   Site:   Days:   Peripheral IV  Right Hand   08/11/2017    1436    Hand   less than 1   Incision (Closed) 05/22/17 Abdomen   05/22/17    0545     95          Labs/Imaging Results for orders placed or performed during the hospital encounter of 08/29/2017 (from the past 48 hour(s))  Comprehensive metabolic panel     Status: Abnormal   Collection Time: 08/07/2017  2:34 PM  Result Value Ref Range   Sodium 144 135 - 145 mmol/L   Potassium 5.3 (H) 3.5 - 5.1 mmol/L    Comment: SLIGHT HEMOLYSIS   Chloride 111 98 - 111 mmol/L   CO2 23 22 - 32 mmol/L   Glucose, Bld 108 (H) 70 - 99 mg/dL   BUN 19 8 - 23 mg/dL   Creatinine, Ser 1.26 (H) 0.44 - 1.00 mg/dL   Calcium 7.6 (L) 8.9 - 10.3 mg/dL   Total Protein 5.9 (L) 6.5 - 8.1 g/dL   Albumin 3.1 (L) 3.5 - 5.0 g/dL   AST 54 (H) 15 - 41 U/L   ALT 18 0 - 44 U/L   Alkaline Phosphatase 85 38 - 126 U/L   Total Bilirubin 1.4 (H) 0.3 - 1.2 mg/dL   GFR calc non Af Amer 38 (L) >60 mL/min   GFR calc Af Amer 44 (L) >60 mL/min    Comment: (NOTE) The eGFR has been calculated using the CKD EPI equation. This calculation has not been validated in all clinical situations. eGFR's persistently <60 mL/min signify possible Chronic Kidney Disease.    Anion gap 10 5 - 15    Comment: Performed at [facility], [place] [address] [place], [place] [address]  Lipase, blood     Status: None   Collection Time: 08/11/2017   2:34 PM  Result Value Ref Range   Lipase 23 11 - 51 U/L    Comment: Performed at [facility], [place] [address], [place], [place] [address]  CBC with Differential     Status: Abnormal   Collection Time: 08/07/2017  2:34 PM  Result Value Ref Range   WBC 7.6 4.0 - 10.5 K/uL   RBC 4.64 3.87 - 5.11 MIL/uL   Hemoglobin 10.7 (L) 12.0 -  15.0 g/dL   HCT 36.1 36.0 - 46.0 %   MCV 77.8 (L) 78.0 - 100.0 fL   MCH 23.1 (L) 26.0 - 34.0 pg   MCHC 29.6 (L) 30.0 - 36.0 g/dL   RDW 23.3 (H) 11.5 - 15.5 %   Platelets 193 150 - 400 K/uL   Neutrophils Relative % 77 %   Lymphocytes Relative 19 %   Monocytes Relative 4 %   Eosinophils Relative 0 %   Basophils Relative 0 %   Neutro Abs 5.9 1.7 - 7.7 K/uL   Lymphs Abs 1.4 0.7 - 4.0 K/uL   Monocytes Absolute 0.3 0.1 - 1.0 K/uL   Eosinophils Absolute 0.0 0.0 - 0.7 K/uL   Basophils Absolute 0.0 0.0 - 0.1 K/uL   RBC Morphology POLYCHROMASIA PRESENT     Comment: ELLIPTOCYTES Performed at [facility], [place] [address], [place], [place] [address]   I-stat troponin, ED     Status: None   Collection Time: 09/01/2017  2:41 PM  Result Value Ref Range   Troponin i, poc 0.01 0.00 - 0.08 ng/mL   Comment 3            Comment: Due to the release kinetics of cTnI, a negative result within the first hours of the onset of symptoms does not rule out myocardial infarction with certainty. If myocardial infarction is still suspected, repeat the test at appropriate intervals.   Urinalysis, Routine w reflex microscopic     Status: Abnormal   Collection Time: 08/28/2017  4:02 PM  Result Value Ref Range   Color, Urine AMBER (A) YELLOW    Comment: BIOCHEMICALS MAY BE AFFECTED BY COLOR   APPearance CLOUDY (A) CLEAR   Specific Gravity, Urine 1.024 1.005 - 1.030   pH 5.0 5.0 - 8.0   Glucose, UA NEGATIVE NEGATIVE mg/dL   Hgb urine dipstick SMALL (A) NEGATIVE   Bilirubin Urine NEGATIVE NEGATIVE   Ketones, ur 5 (A) NEGATIVE mg/dL   Protein, ur  100 (A) NEGATIVE mg/dL   Nitrite NEGATIVE NEGATIVE   Leukocytes, UA MODERATE (A) NEGATIVE   RBC / HPF >50 (H) 0 - 5 RBC/hpf   WBC, UA >50 (H) 0 - 5 WBC/hpf   Bacteria, UA MANY (A) NONE SEEN   WBC Clumps PRESENT    Mucus PRESENT    Hyaline Casts, UA PRESENT     Comment: Performed at [facility], [place] [address], [place], [place] [address]  I-Stat CG4 Lactic Acid, ED     Status: None   Collection Time: 09/01/2017  5:58 PM  Result Value Ref Range   Lactic Acid, Venous 1.71 0.5 - 1.9 mmol/L   Dg Chest 2 View  Result Date: 08/28/2017 CLINICAL DATA:  Nausea, vomiting, confusion; history of hypertension, Merkel cell tumor LEFT eyelid, coronary artery disease, dementia EXAM: CHEST - 2 VIEW COMPARISON:  01/31/2017 FINDINGS: Upper normal size of cardiac silhouette. Mediastinal contours and pulmonary vascularity normal. Atherosclerotic calcification aorta. Scattered subsegmental atelectasis in the mid to lower lungs. No definite infiltrate, pleural effusion or pneumothorax. Bones demineralized with evidence of prior cervicothoracic fusion. IMPRESSION: Bibasilar atelectasis. Electronically Signed   By: Lavonia Dana M.D.   On:  14:59   Ct Abdomen Pelvis W Contrast  Result Date: 08/30/2017 CLINICAL DATA:  Altered mental status.  High-grade bowel obstruction EXAM: CT ABDOMEN AND PELVIS WITH CONTRAST TECHNIQUE: Multidetector CT imaging of the abdomen and pelvis was performed using the standard protocol following bolus administration of intravenous contrast.  CONTRAST:  41m ISOVUE-300 IOPAMIDOL (ISOVUE-300) INJECTION 61% COMPARISON:  05/22/2017 FINDINGS: Lower chest:  21 mm low-density mass in the left breast. Hepatobiliary: Extensive portal venous gas.Cholecystectomy with chronic bile duct dilatation an apparent transition within the pancreatic head. Pancreas: Generalized atrophy Spleen: Unremarkable. Adrenals/Urinary Tract: Negative adrenals. 10 x 22 mm stone at the right UPJ,  chronic. There is a branching calculus in the left lower pole. No hydronephrosis. Unremarkable bladder. Stomach/Bowel: There is gastric emphysema with portal venous gas reaching the liver. The small and large bowel is diffusely dilated, reaching a distended rectum containing stool and measuring up to 8 cm in diameter. Colonic diverticulosis. No pericecal inflammation seen Vascular/Lymphatic: Portal venous gas in the central venous structures. There is extensive atherosclerotic plaque with stenosis of SMA and celiac. Diffuse aortic and iliac stenosis. No mass or adenopathy. Reproductive:No acute finding Other: No ascites or pneumoperitoneum. Musculoskeletal: Advanced degenerative disease with scoliosis. L3-4 and L4-5 laminectomy. Critical Value/emergent results were called by telephone at the time of interpretation on 08/18/2017 at 5:06 pm to Dr. SSherwood Gambler, who verbally acknowledged these results. IMPRESSION: 1. Gastric emphysema and extensive portal venous gas. Need clinical correlation to distinguish benign emphysema (as from patient vomiting) and gastric necrosis. 2. Diffusely dilated small and large bowel above a rectal impaction. Small and large bowel fluid levels, there could be a superimposed enteritis. 3. Advanced atherosclerosis. Electronically Signed   By: JMonte FantasiaM.D.   On: 09/03/2017 17:06    Pending Labs Unresulted Labs (From admission, onward)    Start     Ordered   08/12/2017 1840  Culture, Urine  STAT,   R     08/18/2017 1839   08/18/2017 1839  Culture, blood (routine x 2)  BLOOD CULTURE X 2,   R     08/19/2017 1838   08/22/2017 1819  Procalcitonin - Baseline  STAT,   STAT     09/04/2017 1818   08/24/2017 1819  Lactic acid, plasma  STAT,   STAT     09/01/2017 1818   08/18/2017 1655  Urine culture  STAT,   STAT      1654   Signed and Held  Urine culture  Once,   R     Signed and Held   Signed and Held  TSH  Once,   R     Signed and Held   Signed and HMarine scientistmorning,   R     Signed and Held   Signed and Held  CBC  Tomorrow morning,   R     Signed and Held   Signed and Held  CBC  (heparin)  Once,   R    Comments:  Baseline for heparin therapy IF NOT ALREADY DRAWN.  Notify MD if PLT < 100 K.    Signed and Held   Signed and Held  Creatinine, serum  (heparin)  Once,   R    Comments:  Baseline for heparin therapy IF NOT ALREADY DRAWN.    Signed and Held          Vitals/Pain Today's Vitals   08/19/2017 1318 08/24/2017 1319 08/30/2017 1400 08/27/2017 1724  BP: (!) 159/82  (!) 174/92 (!) 143/99  Pulse: (!) 101  (!) 101 (!) 109  Resp: 18  15 (!) 22  Temp: 98.7 F (37.1 C)     TempSrc: Oral     SpO2: 95%  96% 92%  PainSc:  0-No pain  Isolation Precautions No active isolations  Medications Medications  iopamidol (ISOVUE-300) 61 % injection (has no administration in time range)  metroNIDAZOLE (FLAGYL) IVPB 500 mg (has no administration in time range)  0.9 %  sodium chloride infusion (1,000 mLs Intravenous New Bag/Given 08/08/2017 1745)  0.9 %  sodium chloride infusion (has no administration in time range)  sodium chloride 0.9 % bolus 1,000 mL (0 mLs Intravenous Stopped 08/07/2017 1537)  ondansetron (ZOFRAN) injection 4 mg (4 mg Intravenous Given 08/08/2017 1436)  iopamidol (ISOVUE-300) 61 % injection 100 mL (75 mLs Intravenous Contrast Given 08/21/2017 1613)  ondansetron (ZOFRAN) injection 4 mg (4 mg Intravenous Given by Other 08/19/2017 1628)  cefTRIAXone (ROCEPHIN) 1 g in sodium chloride 0.9 % 100 mL IVPB (1 g Intravenous New Bag/Given 08/10/2017 1746)  milk and molasses enema (250 mLs Rectal Given 09/03/2017 1806)    Mobility non-ambulatory

## 2017-08-25 NOTE — ED Notes (Signed)
Bed: XB26 Expected date: 08/24/2017 Expected time: 1:01 PM Means of arrival: Ambulance Comments: Syncopal episode

## 2017-08-25 NOTE — ED Provider Notes (Signed)
[facility] DEPT Provider Note   CSN: [id number] Arrival date & time: 09/02/2017  1306   LEVEL 5 CAVEAT - DEMENTIA   History   Chief Complaint Chief Complaint  Patient presents with  . Nausea  . Emesis    HPI [name] is a 84 y.o. female.  HPI  84 year old female with a history of dementia and history of prior SBO presents with vomiting.  History is very limited because the patient is alert and oriented only to herself.  There is no family present.  According to staff at the facility and EMS, the patient started to stare off while outside and then vomited.  PCP sent her here because this is similar to when she had a small bowel obstruction.  The patient has no complaints.  Past Medical History:  Diagnosis Date  . Bipolar affective ([place])    History of psychosis  . C. difficile colitis 11/26/2014  . C. difficile diarrhea   . CAD (coronary artery disease)    LAD 30% 2006  . Cerebrovascular disease   . Colonic polyp   . Crohn's disease ([place])   . Diverticulosis of colon   . Fibromyalgia   . GERD (gastroesophageal reflux disease)   . History of atrial flutter   . History of bronchitis   . History of headache   . History of hyperparathyroidism   . History of tuberculosis   . Hyperlipemia   . Hypertension   . Hypothyroidism   . IBS (irritable bowel syndrome)   . Memory disturbance   . 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 Obesity   . OSA (obstructive sleep apnea)    denies 12/07/15  . Osteopenia   . Other diseases of lung, not elsewhere classified   . Psychosis ([place])   . Radiation 05/2008   5000 cGy to left lower eyelid, parotid lymphatics and upper neck  . Restless legs syndrome (RLS)   . Syncope   . Tuberculosis     Patient Active Problem List   Diagnosis Date Noted  . AKI (acute kidney injury) ([place]) 05/22/2017  . Acute lower UTI 05/22/2017  . SBO (small bowel obstruction) s/p SB resection 05/22/2017 05/22/2017  .  Incarcerated ventral hernia s/p SB resection & primary hernia repair 05/22/2017 05/22/2017  . Ventral hernia with bowel obstruction 05/22/2017  . Crohn's disease ([place])   . Bipolar affective ([place])   . Lymphedema of left arm 03/20/2016  . Arm edema 03/18/2016  . Breast cancer of lower-outer quadrant of left female breast ([place]) 11/19/2015  . Dementia with behavioral disturbance 04/27/2015  . Bradycardia   . Sinus bradycardia 02/03/2015  . Syncope 02/03/2015  . Allergic rhinitis 11/04/2013  . Insomnia 11/04/2013  . C7 cervical fracture ([place]) 10/12/2013  . Obesity (BMI 30-39.9) 04/01/2013  . Osteoarthritis 12/16/2012  . Memory impairment 01/15/2012  . Low back pain 02/13/2011  . Low back strain 02/13/2011  . MERKEL CELL TUMOR 12/14/2009  . OTHER DISEASES OF LUNG NOT ELSEWHERE CLASSIFIED 12/14/2009  . RESTLESS LEG SYNDROME 10/16/2008  . H/O Psychosis in past, hospitaluized in '09, in ED Nov 2015 12/15/2007  . Regional enteritis ([place]) 12/15/2007  . Cerebrovascular disease 08/12/2007  . COLONIC POLYPS 04/29/2007  . Hypothyroidism 04/29/2007  . BRONCHITIS, RECURRENT 04/29/2007  . DIVERTICULOSIS OF COLON 04/29/2007  . Irritable bowel syndrome 04/29/2007  . Syncope and collapse 04/28/2007  . OBESITY 01/29/2007  . CAD- minor CAD 2006 01/29/2007  . H/O A flutter,  s/p RFA 2005 01/29/2007  . GASTROESOPHAGEAL REFLUX DISEASE 01/29/2007  . OSTEOPENIA 01/29/2007  . HEADACHE, MIXED 01/29/2007  . Hyperlipidemia 01/12/2007  . OBSTRUCTIVE SLEEP APNEA 01/12/2007  . Essential hypertension 01/12/2007  . FIBROMYALGIA 01/12/2007  . HYPERPARATHYROIDISM, HX OF 10/21/2006    Past Surgical History:  Procedure Laterality Date  . APPENDECTOMY    . BLADDER SUSPENSION    . BREAST LUMPECTOMY WITH RADIOACTIVE SEED LOCALIZATION Left 12/08/2015   Procedure: BREAST LUMPECTOMY WITH RADIOACTIVE SEED LOCALIZATION;  Surgeon: Autumn Messing III, MD;  Location: [facility];  Service: General;  Laterality: Left;  . CATARACT  EXTRACTION Bilateral   . CHOLECYSTECTOMY    . eye lid surgery  04/04/2011   [facility] by Dr. Norlene Duel  . HEEL SPUR EXCISION    . INCISION AND DRAINAGE PERIRECTAL ABSCESS    . LAPAROTOMY N/A 05/22/2017   Procedure: EXPLORATORY LAPAROTOMY reduction repair incarcerated ventral wall hernia small bowel reseection;  Surgeon: Michael Boston, MD;  Location: [facility];  Service: General;  Laterality: N/A;  . LUMBAR LAMINECTOMY    . Moh';s surg left lower eye lid surgery and lid reconstruction surgery  2/10  . NASAL SINUS SURGERY Right    Right maxillary  . Parathyroid adenoma surgery  2007   Dr Armandina Gemma  . POSTERIOR CERVICAL FUSION/FORAMINOTOMY N/A 10/12/2013   Procedure: Cervical five-Thoracic two cervico-thoracic posterior fusion;  Surgeon: [name], MD;  Location: [facility];  Service: Neurosurgery;  Laterality: N/A;  . TONSILLECTOMY AND ADENOIDECTOMY       OB History   None      Home Medications    Prior to Admission medications   Medication Sig Start Date End Date Taking? Authorizing Provider  amLODipine (NORVASC) 5 MG tablet TAKE 1 TABLET (5 MG TOTAL) BY MOUTH DAILY. 02/19/16  Yes Burchette, Alinda Sierras, MD  anastrozole (ARIMIDEX) 1 MG tablet Take 1 tablet (1 mg total) by mouth daily. 07/22/16  Yes [name], MD  aspirin 81 MG EC tablet Take 81 mg by mouth. 04/05/08  Yes [provider]  ferrous sulfate 325 (65 FE) MG EC tablet Take 325 mg by mouth daily with breakfast.   Yes [provider]  levothyroxine (SYNTHROID, LEVOTHROID) 88 MCG tablet Take 1 tablet (88 mcg total) by mouth daily before breakfast. Pt needs an appointment for further refills 01/09/16  Yes Burchette, Alinda Sierras, MD  Multiple Vitamin (MULTIVITAMIN WITH MINERALS) TABS tablet Take 1 tablet by mouth daily.   Yes [provider]  Propylene Glycol (SYSTANE BALANCE) 0.6 % SOLN Place 1 drop into both eyes daily.   Yes [provider]  risperiDONE (RISPERDAL) 0.5 MG tablet Take 0.5 mg by mouth at  bedtime.  08/18/17  Yes [provider]  Skin Protectants, Misc. (DIMETHICONE-ZINC OXIDE) cream Apply 1 application topically 2 (two) times daily.   Yes [provider]  atorvastatin (LIPITOR) 40 MG tablet TAKE 1 TABLET DAILY Patient not taking: Reported on 08/31/2017 03/04/16   [name], MD  dicyclomine (BENTYL) 10 MG capsule Take 1 capsule (10 mg total) by mouth 3 (three) times daily before meals. Patient not taking: Reported on 08/20/2017 03/12/15   Margarita Mail, PA-C  memantine (NAMENDA) 10 MG tablet TAKE 1 TABLET (10 MG TOTAL) BY MOUTH TWO  TIMES DAILY. Patient not taking: Reported on 08/11/2017 09/04/16   [name], MD  risperiDONE (RISPERDAL) 1 MG tablet Take 0.5 tablets (0.5 mg total) at bedtime by mouth. Patient not taking: Reported on 08/07/2017 11/20/16  [name], MD    Family History Family History  Problem Relation Age of Onset  . Heart failure Father   . Arthritis Mother   . Hypertension Mother   . Ulcers Mother   . Colon cancer Maternal Grandmother   . Pulmonary embolism Unknown     Social History Social History   Tobacco Use  . Smoking status: Former Smoker    Years: 6.00    Types: Cigarettes    Last attempt to quit: 01/08/1960    Years since quitting: 57.6  . Smokeless tobacco: Never Used  Substance Use Topics  . Alcohol use: No  . Drug use: No     Allergies   Prednisone; Carbamazepine; Codeine; Demerol [meperidine]; Diclofenac sodium; Fentanyl; Fluoxetine hcl; Gentamicin; Hydromorphone hcl; Ibuprofen; Indomethacin; Lisinopril; Meperidine hcl; Morphine; Pregabalin; Propranolol hcl; Refresh lacri-lube [eye lubricant]; Ropinirole hcl; Sulfamethoxazole; White petrolatum-mineral oil  [aquaphilic]; and Bacitracin   Review of Systems Review of Systems  Unable to perform ROS: Mental status change     Physical Exam Updated Vital Signs BP (!) 143/99   Pulse (!) 109   Temp 98.7 F (37.1 C) (Oral)   Resp (!) 22    SpO2 92%   Physical Exam  Constitutional: She appears well-developed and well-nourished. No distress.  Awake, alert but disoriented  HENT:  Head: Normocephalic and atraumatic.  Right Ear: External ear normal.  Left Ear: External ear normal.  Nose: Nose normal.  Eyes: Right eye exhibits no discharge. Left eye exhibits no discharge.  Cardiovascular: Regular rhythm and normal heart sounds. Tachycardia present.  HR~100  Pulmonary/Chest: Effort normal and breath sounds normal.  Abdominal: Soft. There is generalized tenderness (mild).  Neurological: She is alert. She is disoriented.  Skin: Skin is warm and dry. She is not diaphoretic.  Nursing note and vitals reviewed.    ED Treatments / Results  Labs (all labs ordered are listed, but only abnormal results are displayed) Labs Reviewed  COMPREHENSIVE METABOLIC PANEL - Abnormal; Notable for the following components:      Result Value   Potassium 5.3 (*)    Glucose, Bld 108 (*)    Creatinine, Ser 1.26 (*)    Calcium 7.6 (*)    Total Protein 5.9 (*)    Albumin 3.1 (*)    AST 54 (*)    Total Bilirubin 1.4 (*)    GFR calc non Af Amer 38 (*)    GFR calc Af Amer 44 (*)    All other components within normal limits  CBC WITH DIFFERENTIAL/PLATELET - Abnormal; Notable for the following components:   Hemoglobin 10.7 (*)    MCV 77.8 (*)    MCH 23.1 (*)    MCHC 29.6 (*)    RDW 23.3 (*)    All other components within normal limits  URINALYSIS, ROUTINE W REFLEX MICROSCOPIC - Abnormal; Notable for the following components:   Color, Urine AMBER (*)    APPearance CLOUDY (*)    Hgb urine dipstick SMALL (*)    Ketones, ur 5 (*)    Protein, ur 100 (*)    Leukocytes, UA MODERATE (*)    RBC / HPF >50 (*)    WBC, UA >50 (*)    Bacteria, UA MANY (*)    All other components within normal limits  URINE CULTURE  LIPASE, BLOOD  I-STAT TROPONIN, ED  I-STAT CG4 LACTIC ACID, ED    EKG EKG Interpretation  Date/Time:  Monday August 25 2017  13:57:37 EDT Ventricular Rate:  101 PR Interval:    QRS Duration: 86 QT Interval:  349 QTC Calculation: 453 R Axis:   69 Text Interpretation:  Sinus tachycardia Atrial premature complex Baseline wander in lead(s) V5 Confirmed by Sherwood Gambler [id number]) on 09/02/2017 2:20:52 PM Also confirmed by Sherwood Gambler [phone]), editor Philomena Doheny [phone])  on 08/20/2017 4:57:13 PM   Radiology Dg Chest 2 View  Result Date: 08/09/2017 CLINICAL DATA:  Nausea, vomiting, confusion; history of hypertension, Merkel cell tumor LEFT eyelid, coronary artery disease, dementia EXAM: CHEST - 2 VIEW COMPARISON:  01/31/2017 FINDINGS: Upper normal size of cardiac silhouette. Mediastinal contours and pulmonary vascularity normal. Atherosclerotic calcification aorta. Scattered subsegmental atelectasis in the mid to lower lungs. No definite infiltrate, pleural effusion or pneumothorax. Bones demineralized with evidence of prior cervicothoracic fusion. IMPRESSION: Bibasilar atelectasis. Electronically Signed   By: Lavonia Dana M.D.   On: 08/14/2017 14:59   Ct Abdomen Pelvis W Contrast  Result Date: 08/23/2017 CLINICAL DATA:  Altered mental status.  High-grade bowel obstruction EXAM: CT ABDOMEN AND PELVIS WITH CONTRAST TECHNIQUE: Multidetector CT imaging of the abdomen and pelvis was performed using the standard protocol following bolus administration of intravenous contrast. CONTRAST:  58mL ISOVUE-300 IOPAMIDOL (ISOVUE-300) INJECTION 61% COMPARISON:  05/22/2017 FINDINGS: Lower chest:  21 mm low-density mass in the left breast. Hepatobiliary: Extensive portal venous gas.Cholecystectomy with chronic bile duct dilatation an apparent transition within the pancreatic head. Pancreas: Generalized atrophy Spleen: Unremarkable. Adrenals/Urinary Tract: Negative adrenals. 10 x 22 mm stone at the right UPJ, chronic. There is a branching calculus in the left lower pole. No hydronephrosis. Unremarkable bladder. Stomach/Bowel: There is gastric  emphysema with portal venous gas reaching the liver. The small and large bowel is diffusely dilated, reaching a distended rectum containing stool and measuring up to 8 cm in diameter. Colonic diverticulosis. No pericecal inflammation seen Vascular/Lymphatic: Portal venous gas in the central venous structures. There is extensive atherosclerotic plaque with stenosis of SMA and celiac. Diffuse aortic and iliac stenosis. No mass or adenopathy. Reproductive:No acute finding Other: No ascites or pneumoperitoneum. Musculoskeletal: Advanced degenerative disease with scoliosis. L3-4 and L4-5 laminectomy. Critical Value/emergent results were called by telephone at the time of interpretation on 08/15/2017 at 5:06 pm to Dr. Sherwood Gambler , who verbally acknowledged these results. IMPRESSION: 1. Gastric emphysema and extensive portal venous gas. Need clinical correlation to distinguish benign emphysema (as from patient vomiting) and gastric necrosis. 2. Diffusely dilated small and large bowel above a rectal impaction. Small and large bowel fluid levels, there could be a superimposed enteritis. 3. Advanced atherosclerosis. Electronically Signed   By: Monte Fantasia M.D.   On: 08/15/2017 17:06    Procedures Fecal disimpaction Date/Time: 08/07/2017 5:33 PM Performed by: Sherwood Gambler, MD Authorized by: Sherwood Gambler, MD  Consent: Verbal consent obtained. Consent given by: guardian Local anesthesia used: no  Anesthesia: Local anesthesia used: no  Sedation: Patient sedated: no  Patient tolerance: Patient tolerated the procedure well with no immediate complications    (including critical care time)  Medications Ordered in ED Medications  iopamidol (ISOVUE-300) 61 % injection (has no administration in time range)  cefTRIAXone (ROCEPHIN) 1 g in sodium chloride 0.9 % 100 mL IVPB (has no administration in time range)  milk and molasses enema (has no administration in time range)  metroNIDAZOLE (FLAGYL) IVPB  500 mg (has no administration in time range)  sodium chloride 0.9 % bolus 1,000 mL (0 mLs Intravenous Stopped 08/10/2017 1537)  ondansetron (ZOFRAN) injection 4 mg (4 mg Intravenous Given  08/09/2017 1436)  iopamidol (ISOVUE-300) 61 % injection 100 mL (75 mLs Intravenous Contrast Given 08/07/2017 1613)  ondansetron (ZOFRAN) injection 4 mg (4 mg Intravenous Given by Other 08/18/2017 1628)     Initial Impression / Assessment and Plan / ED Course  I have reviewed the triage vital signs and the nursing notes.  Pertinent labs & imaging results that were available during my care of the patient were reviewed by me and considered in my medical decision making (see chart for details).     I discussed the CT results with Dr. Harlow Asa, who will come evaluate the patient but at this point recommends antibiotics to cover intra-abdominal organisms and she will need to be observed in the hospital for at least 24 hours to see if she declares herself.  As for the bowel obstruction, this appears to be due to the fecal impaction.  A moderate amount of stool was removed by myself and then an enema was placed with a large amount of stool coming out.  She will need continued bowel care and hydration.  Hospitalist to admit.  Final Clinical Impressions(s) / ED Diagnoses   Final diagnoses:  Fecal impaction ([place])  Small bowel obstruction ([place])  Acute urinary tract infection    ED Discharge Orders    None       Sherwood Gambler, MD 08/24/2017 [phone]

## 2017-08-25 NOTE — ED Notes (Signed)
While pt was in CT, pt had a large episode of Emesis. Charge nurse Domingo Dimes gave zofran while pt was in CT.

## 2017-08-25 NOTE — Progress Notes (Signed)
CRITICAL VALUE ALERT  Critical Value:  Lactic 2.2  Date & Time Notied:  8/19 2030  Provider Notified: K Schorr  Orders Received/Actions taken: Fluid bolus

## 2017-08-25 NOTE — H&P (Signed)
History and Physical        Hospital Admission Note Date: 08/24/2017  Patient name: [name] Medical record number: [id number] Date of birth: Feb 04, 1932 Age: 84 y.o. Gender: female  PCP: [name], MD    Patient coming from: Skilled nursing facility  I have reviewed all records in the [facility].    Chief Complaint:  Nausea and vomiting with near syncope  HPI: Patient is 84 year old with history of severe dementia, GERD, history of atrial flutter, CVA, CAD, prior bowel obstructions brought from the skilled nursing facility due to nausea and vomiting.  Patient has dementia and unable to provide any history, no family present.  According to the ER records, staff at the facility reported that patient started to stare off while outside and started vomiting.  Patient was sent to the ER as her symptoms were similar to when she had a small bowel obstructions.  Patient is oriented to self otherwise unaware why she is in the hospital.    ED work-up/course:  In ED, temp 98.7, pulse 101, BP 174/92, respiratory rate 22, O2 sats 96% on room air BMET 144, potassium 5.3, creatinine 1.2, hemoglobin 10.7, WBC 7.6, lactic acid 1.7.  CT abdomen pelvis showed gastric emphysema and extensive portal venous gas, benign emphysema versus gastric necrosis.  Diffusely dilated small and large bowel above rectal impaction, small and large bowel fluid levels could be superimposed enteritis   Review of Systems: Positives marked in 'bold' Unable to obtain from the patient due to dementia and acute encephalopathy   Past Medical History: Past Medical History:  Diagnosis Date  . Bipolar affective ([place])    History of psychosis  . C. difficile colitis 11/26/2014  . C. difficile diarrhea   . CAD (coronary artery disease)    LAD 30% 2006  . Cerebrovascular disease   . Colonic polyp     . Crohn's disease ([place])   . Diverticulosis of colon   . Fibromyalgia   . GERD (gastroesophageal reflux disease)   . History of atrial flutter   . History of bronchitis   . History of headache   . History of hyperparathyroidism   . History of tuberculosis   . Hyperlipemia   . Hypertension   . Hypothyroidism   . IBS (irritable bowel syndrome)   . Memory disturbance   . 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 Obesity   . OSA (obstructive sleep apnea)    denies 12/07/15  . Osteopenia   . Other diseases of lung, not elsewhere classified   . Psychosis ([place])   . Radiation 05/2008   5000 cGy to left lower eyelid, parotid lymphatics and upper neck  . Restless legs syndrome (RLS)   . Syncope   . Tuberculosis     Past Surgical History:  Procedure Laterality Date  . APPENDECTOMY    . BLADDER SUSPENSION    . BREAST LUMPECTOMY WITH RADIOACTIVE SEED LOCALIZATION Left 12/08/2015   Procedure: BREAST LUMPECTOMY WITH RADIOACTIVE SEED LOCALIZATION;  Surgeon: Autumn Messing III, MD;  Location: [facility];  Service: General;  Laterality: Left;  . CATARACT EXTRACTION Bilateral   . CHOLECYSTECTOMY    .  eye lid surgery  04/04/2011   [facility] by Dr. Norlene Duel  . HEEL SPUR EXCISION    . INCISION AND DRAINAGE PERIRECTAL ABSCESS    . LAPAROTOMY N/A 05/22/2017   Procedure: EXPLORATORY LAPAROTOMY reduction repair incarcerated ventral wall hernia small bowel reseection;  Surgeon: Michael Boston, MD;  Location: [facility];  Service: General;  Laterality: N/A;  . LUMBAR LAMINECTOMY    . Moh';s surg left lower eye lid surgery and lid reconstruction surgery  2/10  . NASAL SINUS SURGERY Right    Right maxillary  . Parathyroid adenoma surgery  2007   Dr Armandina Gemma  . POSTERIOR CERVICAL FUSION/FORAMINOTOMY N/A 10/12/2013   Procedure: Cervical five-Thoracic two cervico-thoracic posterior fusion;  Surgeon: [name], MD;  Location: [facility];  Service: Neurosurgery;  Laterality: N/A;  . TONSILLECTOMY AND ADENOIDECTOMY       Medications: Prior to Admission medications   Medication Sig Start Date End Date Taking? Authorizing Provider  amLODipine (NORVASC) 5 MG tablet TAKE 1 TABLET (5 MG TOTAL) BY MOUTH DAILY. 02/19/16  Yes Burchette, Alinda Sierras, MD  anastrozole (ARIMIDEX) 1 MG tablet Take 1 tablet (1 mg total) by mouth daily. 07/22/16  Yes [name], MD  aspirin 81 MG EC tablet Take 81 mg by mouth. 04/05/08  Yes [provider]  ferrous sulfate 325 (65 FE) MG EC tablet Take 325 mg by mouth daily with breakfast.   Yes [provider]  levothyroxine (SYNTHROID, LEVOTHROID) 88 MCG tablet Take 1 tablet (88 mcg total) by mouth daily before breakfast. Pt needs an appointment for further refills 01/09/16  Yes Burchette, Alinda Sierras, MD  Multiple Vitamin (MULTIVITAMIN WITH MINERALS) TABS tablet Take 1 tablet by mouth daily.   Yes [provider]  Propylene Glycol (SYSTANE BALANCE) 0.6 % SOLN Place 1 drop into both eyes daily.   Yes [provider]  risperiDONE (RISPERDAL) 0.5 MG tablet Take 0.5 mg by mouth at bedtime.  08/18/17  Yes [provider]  Skin Protectants, Misc. (DIMETHICONE-ZINC OXIDE) cream Apply 1 application topically 2 (two) times daily.   Yes [provider]  atorvastatin (LIPITOR) 40 MG tablet TAKE 1 TABLET DAILY Patient not taking: Reported on 08/13/2017 03/04/16   [name], MD  dicyclomine (BENTYL) 10 MG capsule Take 1 capsule (10 mg total) by mouth 3 (three) times daily before meals. Patient not taking: Reported on 09/06/2017 03/12/15   Margarita Mail, PA-C  memantine (NAMENDA) 10 MG tablet TAKE 1 TABLET (10 MG TOTAL) BY MOUTH TWO  TIMES DAILY. Patient not taking: Reported on 08/09/2017 09/04/16   [name], MD  risperiDONE (RISPERDAL) 1 MG tablet Take 0.5 tablets (0.5 mg total) at bedtime by mouth. Patient not taking: Reported on 08/23/2017 11/20/16   [name], MD    Allergies:   Allergies  Allergen Reactions  . Prednisone Other  (See Comments)    Patient had psychiatric episode with hallucinations. Caused 3.5 week long hospitalization & loss of memory   . Carbamazepine Nausea Only and Other (See Comments)    Tegretol; dizziness, blurred vision, nausea, headache, insomnia  . Codeine Nausea And Vomiting  . Demerol [Meperidine] Nausea And Vomiting  . Diclofenac Sodium Other (See Comments)    severe headache  . Fentanyl Nausea And Vomiting and Other (See Comments)    IV Fentanyl; caused nausea and vomiting Fentanyl patch; caused chest pain, HBP, with second patch experienced vertigo and nausea  . Fluoxetine Hcl Nausea Only and Other (See Comments)  dizziness, blurred vision, nausea, headache, insomnia  . Gentamicin     Other reaction(s): EYE IRRITATION  . Hydromorphone Hcl Nausea And Vomiting  . Ibuprofen Other (See Comments)    Caused ulcers and colitis  . Indomethacin Nausea Only and Other (See Comments)    dizziness, blurred vision, nausea, headache, insomnia  . Lisinopril Cough  . Meperidine Hcl Nausea And Vomiting  . Morphine Nausea And Vomiting  . Pregabalin Other (See Comments)    severe restless legs, insomnia  . Propranolol Hcl Nausea Only and Other (See Comments)    dizziness, blurred vision, nausea, headache, insomnia  . Refresh Lacri-Lube [Eye Lubricant] Swelling    Caused swelling, redness, hard crust on eyelids   . Ropinirole Hcl Other (See Comments)    severe restless legs and insomnia  . Sulfamethoxazole Nausea Only and Other (See Comments)    gas, loose stools  . White Petrolatum-Mineral Oil  [Aquaphilic]     Other reaction(s): SWELLING/EDEMA  . Bacitracin Hives, Itching and Rash    redness    Social History:  reports that she quit smoking about 57 years ago. Her smoking use included cigarettes. She quit after 6.00 years of use. She has never used smokeless tobacco. She reports that she does not drink alcohol or use drugs.  Family History: Family History  Problem Relation Age of  Onset  . Heart failure Father   . Arthritis Mother   . Hypertension Mother   . Ulcers Mother   . Colon cancer Maternal Grandmother   . Pulmonary embolism Unknown     Physical Exam: Blood pressure (!) 143/99, pulse (!) 109, temperature 98.7 F (37.1 C), temperature source Oral, resp. rate (!) 22, SpO2 92 %. General: Alert, awake, oriented to self more confused Eyes: pink conjunctiva,anicteric sclera, pupils equal and reactive to light and accomodation, HEENT: normocephalic, atraumatic, oropharynx clear Neck: supple, no masses or lymphadenopathy, no goiter, no bruits, no JVD CVS: Regular rate and rhythm, without murmurs, rubs or gallops. No lower extremity edema Resp : Clear to auscultation bilaterally, no wheezing, rales or rhonchi. GI : Soft, distended, hypoactive bowel sounds, diffuse tenderness .  Musculoskeletal: No clubbing or cyanosis, positive pedal pulses. No contracture. ROM intact  Neuro: moving all 4 extremities spontaneously Psych: confused Skin: no rashes or lesions, warm and dry   LABS on Admission: I have personally reviewed all the labs and imagings below    Basic Metabolic Panel: Recent Labs  Lab 08/27/2017 1434  NA 144  K 5.3*  CL 111  CO2 23  GLUCOSE 108*  BUN 19  CREATININE 1.26*  CALCIUM 7.6*   Liver Function Tests: Recent Labs  Lab 08/24/2017 1434  AST 54*  ALT 18  ALKPHOS 85  BILITOT 1.4*  PROT 5.9*  ALBUMIN 3.1*   Recent Labs  Lab 08/21/2017 1434  LIPASE 23   No results for input(s): AMMONIA in the last 168 hours. CBC: Recent Labs  Lab 08/18/2017 1434  WBC 7.6  NEUTROABS 5.9  HGB 10.7*  HCT 36.1  MCV 77.8*  PLT 193   Cardiac Enzymes: No results for input(s): CKTOTAL, CKMB, CKMBINDEX, TROPONINI in the last 168 hours. BNP: Invalid input(s): POCBNP CBG: No results for input(s): GLUCAP in the last 168 hours.  Radiological Exams on Admission:  Dg Chest 2 View  Result Date: 08/12/2017 CLINICAL DATA:  Nausea, vomiting, confusion;  history of hypertension, Merkel cell tumor LEFT eyelid, coronary artery disease, dementia EXAM: CHEST - 2 VIEW COMPARISON:  01/31/2017 FINDINGS: Upper normal  size of cardiac silhouette. Mediastinal contours and pulmonary vascularity normal. Atherosclerotic calcification aorta. Scattered subsegmental atelectasis in the mid to lower lungs. No definite infiltrate, pleural effusion or pneumothorax. Bones demineralized with evidence of prior cervicothoracic fusion. IMPRESSION: Bibasilar atelectasis. Electronically Signed   By: Lavonia Dana M.D.   On: 09/01/2017 14:59   Ct Abdomen Pelvis W Contrast  Result Date: 09/04/2017 CLINICAL DATA:  Altered mental status.  High-grade bowel obstruction EXAM: CT ABDOMEN AND PELVIS WITH CONTRAST TECHNIQUE: Multidetector CT imaging of the abdomen and pelvis was performed using the standard protocol following bolus administration of intravenous contrast. CONTRAST:  36m ISOVUE-300 IOPAMIDOL (ISOVUE-300) INJECTION 61% COMPARISON:  05/22/2017 FINDINGS: Lower chest:  21 mm low-density mass in the left breast. Hepatobiliary: Extensive portal venous gas.Cholecystectomy with chronic bile duct dilatation an apparent transition within the pancreatic head. Pancreas: Generalized atrophy Spleen: Unremarkable. Adrenals/Urinary Tract: Negative adrenals. 10 x 22 mm stone at the right UPJ, chronic. There is a branching calculus in the left lower pole. No hydronephrosis. Unremarkable bladder. Stomach/Bowel: There is gastric emphysema with portal venous gas reaching the liver. The small and large bowel is diffusely dilated, reaching a distended rectum containing stool and measuring up to 8 cm in diameter. Colonic diverticulosis. No pericecal inflammation seen Vascular/Lymphatic: Portal venous gas in the central venous structures. There is extensive atherosclerotic plaque with stenosis of SMA and celiac. Diffuse aortic and iliac stenosis. No mass or adenopathy. Reproductive:No acute finding Other: No  ascites or pneumoperitoneum. Musculoskeletal: Advanced degenerative disease with scoliosis. L3-4 and L4-5 laminectomy. Critical Value/emergent results were called by telephone at the time of interpretation on 08/31/2017 at 5:06 pm to Dr. SSherwood Gambler, who verbally acknowledged these results. IMPRESSION: 1. Gastric emphysema and extensive portal venous gas. Need clinical correlation to distinguish benign emphysema (as from patient vomiting) and gastric necrosis. 2. Diffusely dilated small and large bowel above a rectal impaction. Small and large bowel fluid levels, there could be a superimposed enteritis. 3. Advanced atherosclerosis. Electronically Signed   By: JMonte FantasiaM.D.   On: 08/09/2017 17:06      EKG: Independently reviewed.  Rate 101, sinus tachycardia   Assessment/Plan Principal Problem:   SBO (small bowel obstruction) with fecal impaction -Prior history of small bowel obstructions, had a small bowel resection in 05/2017 -Per EDP, was disimpacted in ED.  CT of the abdomen concerning for possible benign emphysema versus gastric necrosis ?  Perforation.  Small and large bowel fluid levels -Placed on IV Rocephin and Flagyl -Obtain procalcitonin, lactic acid, blood cultures -Surgery (Dr GHarlow Asa has been consulted in ED, continue n.p.o. status, IV fluids, IV antibiotics, will follow recommendations  Active Problems:   Essential hypertension -BP currently elevated, placed on IV hydralazine as needed with parameters  Acute metabolic encephalopathy superimposed on dementia -Likely due to #1 and UTI, minimize sedatives and pain medications, -Continue IV antibiotics, follow blood cultures, urine cultures, continue risperidone     AK[place]) [place] -Presented with creatinine 1.2 at the time of admission with hyperkalemia  -Placed on IV fluid hydration  Hyperkalemia -Recheck potassium level, will place on Kayexalate x1    Acute lower UTI -Follow urine culture and  sensitivities, placed on IV Rocephin  Fecal impaction -Has been disimpacted in ED, will place on bowel regimen once tolerating oral diet, placed on Dulcolax positive  DVT prophylaxis: Heparin subcu  CODE STATUS: Full CODE STATUS  Consults called: Surgery by EDP  Family Communication: No family member present at the bedside  Admission status: Inpatient stepdown, until evaluated by general surgery.  If perforation ruled out, may change to telemetry  Disposition plan: Further plan will depend as patient's clinical course evolves and further radiologic and laboratory data become available.    At the time of admission, it appears that the appropriate admission status for this patient is INPATIENT . This is judged to be reasonable and necessary in order to provide the required intensity of service to ensure the patient's safety given the presenting symptoms, small bowel obstruction, fecal impaction, UTI physical exam findings, and initial radiographic and laboratory data in the context of their chronic comorbidities.  The medical decision making on this patient was of high complexity and the patient is at high risk for clinical deterioration, therefore this is a level 3 visit.   Time Spent on Admission: 60-minute    Ripudeep Rai M.D. [facility] 08/31/2017, 6:37 PM Pager: [phone]  If 7PM-7AM, please contact night-coverage [email or web address] Password [email or web address]

## 2017-08-26 ENCOUNTER — Other Ambulatory Visit: Payer: Self-pay

## 2017-08-26 ENCOUNTER — Inpatient Hospital Stay (HOSPITAL_COMMUNITY): Payer: Medicare Other

## 2017-08-26 DIAGNOSIS — K56609 Unspecified intestinal obstruction, unspecified as to partial versus complete obstruction: Principal | ICD-10-CM

## 2017-08-26 LAB — BASIC METABOLIC PANEL
ANION GAP: 14 (ref 5–15)
BUN: 29 mg/dL — ABNORMAL HIGH (ref 8–23)
CHLORIDE: 110 mmol/L (ref 98–111)
CO2: 19 mmol/L — AB (ref 22–32)
Calcium: 8.2 mg/dL — ABNORMAL LOW (ref 8.9–10.3)
Creatinine, Ser: 1.96 mg/dL — ABNORMAL HIGH (ref 0.44–1.00)
GFR calc non Af Amer: 22 mL/min — ABNORMAL LOW (ref >60)
GFR, EST AFRICAN AMERICAN: 26 mL/min — AB (ref >60)
Glucose, Bld: 208 mg/dL — ABNORMAL HIGH (ref 70–99)
Potassium: 4.3 mmol/L (ref 3.5–5.1)
SODIUM: 143 mmol/L (ref 135–145)

## 2017-08-26 LAB — BLOOD GAS, VENOUS
Acid-base deficit: 14.3 mmol/L — ABNORMAL HIGH (ref 0.0–2.0)
BICARBONATE: 14.9 mmol/L — AB (ref 20.0–28.0)
O2 Saturation: 54.1 %
PATIENT TEMPERATURE: 98.6
PCO2 VEN: 49.5 mmHg (ref 44.0–60.0)
PH VEN: 7.106 — AB (ref 7.250–7.430)
PO2 VEN: 43.7 mmHg (ref 32.0–45.0)

## 2017-08-26 LAB — CBC
HCT: 42.4 % (ref 36.0–46.0)
HCT: 41.4 % (ref 36.0–46.0)
HEMOGLOBIN: 12.5 g/dL (ref 12.0–15.0)
Hemoglobin: 11.9 g/dL — ABNORMAL LOW (ref 12.0–15.0)
MCH: 23 pg — AB (ref 26.0–34.0)
MCH: 23.2 pg — AB (ref 26.0–34.0)
MCHC: 29.5 g/dL — ABNORMAL LOW (ref 30.0–36.0)
MCHC: 28.7 g/dL — ABNORMAL LOW (ref 30.0–36.0)
MCV: 78.1 fL (ref 78.0–100.0)
MCV: 80.9 fL (ref 78.0–100.0)
PLATELETS: 209 10*3/uL (ref 150–400)
Platelets: 195 10*3/uL (ref 150–400)
RBC: 5.43 MIL/uL — AB (ref 3.87–5.11)
RBC: 5.12 MIL/uL — AB (ref 3.87–5.11)
RDW: 23.7 % — ABNORMAL HIGH (ref 11.5–15.5)
RDW: 24.2 % — AB (ref 11.5–15.5)
WBC: 12.3 10*3/uL — AB (ref 4.0–10.5)
WBC: 9.8 10*3/uL (ref 4.0–10.5)

## 2017-08-26 LAB — COMPREHENSIVE METABOLIC PANEL
ALT: 30 U/L (ref 0–44)
AST: 54 U/L — ABNORMAL HIGH (ref 15–41)
Albumin: 2.9 g/dL — ABNORMAL LOW (ref 3.5–5.0)
Alkaline Phosphatase: 85 U/L (ref 38–126)
Anion gap: 17 — ABNORMAL HIGH (ref 5–15)
BUN: 32 mg/dL — ABNORMAL HIGH (ref 8–23)
CHLORIDE: 111 mmol/L (ref 98–111)
CO2: 17 mmol/L — AB (ref 22–32)
CREATININE: 2.75 mg/dL — AB (ref 0.44–1.00)
Calcium: 8.1 mg/dL — ABNORMAL LOW (ref 8.9–10.3)
GFR, EST AFRICAN AMERICAN: 17 mL/min — AB (ref >60)
GFR, EST NON AFRICAN AMERICAN: 15 mL/min — AB (ref >60)
Glucose, Bld: 257 mg/dL — ABNORMAL HIGH (ref 70–99)
Potassium: 5.2 mmol/L — ABNORMAL HIGH (ref 3.5–5.1)
SODIUM: 145 mmol/L (ref 135–145)
Total Bilirubin: 0.5 mg/dL (ref 0.3–1.2)
Total Protein: 5.5 g/dL — ABNORMAL LOW (ref 6.5–8.1)

## 2017-08-26 LAB — LACTIC ACID, PLASMA
LACTIC ACID, VENOUS: 8 mmol/L — AB (ref 0.5–1.9)
Lactic Acid, Venous: 3.8 mmol/L (ref 0.5–1.9)
Lactic Acid, Venous: 3.6 mmol/L (ref 0.5–1.9)

## 2017-08-26 LAB — MRSA PCR SCREENING: MRSA by PCR: POSITIVE — AB

## 2017-08-26 LAB — LACTATE DEHYDROGENASE: LDH: 200 U/L — ABNORMAL HIGH (ref 98–192)

## 2017-08-26 MED ORDER — MUPIROCIN 2 % EX OINT
1.0000 "application " | TOPICAL_OINTMENT | Freq: Two times a day (BID) | CUTANEOUS | Status: DC
  Administered 2017-08-26: 1 via NASAL
  Filled 2017-08-26: qty 22

## 2017-08-26 MED ORDER — LORAZEPAM 2 MG/ML IJ SOLN: 0.5000 mg | INTRAMUSCULAR | Status: DC | PRN

## 2017-08-26 MED ORDER — CHLORHEXIDINE GLUCONATE CLOTH 2 % EX PADS
6.0000 | MEDICATED_PAD | Freq: Every day | CUTANEOUS | Status: DC
  Administered 2017-08-26: 6 via TOPICAL

## 2017-08-26 MED ORDER — OXYCODONE-ACETAMINOPHEN 5-325 MG PO TABS: 2.0000 | ORAL_TABLET | Freq: Once | ORAL | Status: DC

## 2017-08-26 MED ORDER — MORPHINE SULFATE (PF) 2 MG/ML IV SOLN: 2.0000 mg | Freq: Four times a day (QID) | INTRAVENOUS | Status: DC | PRN

## 2017-08-26 MED ORDER — MORPHINE SULFATE (PF) 2 MG/ML IV SOLN
2.0000 mg | Freq: Once | INTRAVENOUS | Status: DC
  Filled 2017-08-26: qty 1

## 2017-08-26 MED ORDER — NALOXONE HCL 0.4 MG/ML IJ SOLN
INTRAMUSCULAR | Status: AC
  Filled 2017-08-26: qty 1

## 2017-08-26 MED ORDER — MORPHINE 100MG IN NS 100ML (1MG/ML) PREMIX INFUSION
1.0000 mg/h | INTRAVENOUS | Status: DC
  Filled 2017-08-26: qty 100

## 2017-08-26 MED ORDER — FUROSEMIDE 10 MG/ML IJ SOLN
INTRAMUSCULAR | Status: AC
  Filled 2017-08-26: qty 4

## 2017-08-26 MED ORDER — MORPHINE SULFATE (PF) 2 MG/ML IV SOLN
2.0000 mg | Freq: Once | INTRAVENOUS | Status: AC
  Administered 2017-08-26: 2 mg via INTRAVENOUS
  Filled 2017-08-26: qty 1

## 2017-08-26 MED ORDER — SODIUM CHLORIDE 0.9 % IV BOLUS
500.0000 mL | Freq: Once | INTRAVENOUS | Status: AC
  Administered 2017-08-26: 500 mL via INTRAVENOUS

## 2017-08-26 MED ORDER — PROMETHAZINE HCL 25 MG/ML IJ SOLN
12.5000 mg | Freq: Once | INTRAMUSCULAR | Status: AC
  Administered 2017-08-26: 12.5 mg via INTRAVENOUS
  Filled 2017-08-26: qty 1

## 2017-08-26 MED ORDER — FUROSEMIDE 10 MG/ML IJ SOLN
20.0000 mg | Freq: Once | INTRAMUSCULAR | Status: AC
  Administered 2017-08-26: 20 mg via INTRAVENOUS

## 2017-08-27 LAB — URINE CULTURE

## 2017-08-30 LAB — CULTURE, BLOOD (ROUTINE X 2)
CULTURE: NO GROWTH
Culture: NO GROWTH
Special Requests: ADEQUATE

## 2017-09-07 NOTE — Death Summary Note (Addendum)
Death Summary  [name]:[id number] DOB: 1932-01-25 DOA: 08-31-17  PCP: [name], MD PCP/Office notified:   Admit date: 2017-08-31 Date of Death: 09-01-17  Final Diagnoses:  Principal Problem:   SBO (small bowel obstruction) s/p SB resection 05/22/2017 Active Problems:   Essential hypertension   GASTROESOPHAGEAL REFLUX DISEASE   Obesity (BMI 30-39.9)   Dementia with behavioral disturbance   AKI (acute kidney injury) ([place])   Acute lower UTI   Acute metabolic encephalopathy  History of present illness:  On 31-Aug-2017 by Dr. Estill Cotta Patient is 84 year old with history of severe dementia, GERD, history of atrial flutter, CVA, CAD, prior bowel obstructions brought from the skilled nursing facility due to nausea and vomiting.  Patient has dementia and unable to provide any history, no family present.  According to the ER records, staff at the facility reported that patient started to stare off while outside and started vomiting.  Patient was sent to the ER as her symptoms were similar to when she had a small bowel obstructions.  Patient is oriented to self otherwise unaware why she is in the hospital.   Hospital Course:  Patient was initially admitted with small bowel obstruction with fecal impaction.  She was given IV Rocephin and Flagyl.  CT of the abdomen was concerning for possible benign emphysema versus gastric necrosis with possible perforation.  Patient was also placed on pain control.  General surgery was consulted and appreciated and recommended IV fluids as well as antibiotics.  Overnight patient began to worsen and was placed on IV pain control.  Upon examination this morning, patient was noted to have agonal breathing and was unresponsive.  ABG as well as chest and abdominal x-rays along with repeat CBC and BMP, lactic acid were ordered.  Lactic acid unfortunately was 8 which was increased from admission of 3.8.  Her creatinine also worsened to 2.75.  Her hemoglobin  also became elevated to 11.9.  VBG showed acidosis of 7.1 with a bicarb of 14.9.  Had received 2 L of normal saline overnight.  She did have worsening breath sounds, IV Lasix was ordered.  Patient continued to have worsening of her breathing and was placed on oxygen and PCCM was consulted.  I called patient's daughter Mrs. [name], who directed me to contact [employer] of guardianship have power of attorney over patient's medical decisions.  [employer] of guardianship was contacted, message was left for [name].  I then since place of residence which is [employer].  Several different people, and then was connected to Mrs. [name], representative of Corporation guardianship.  We discussed patient's current status and decline.  It was decided to make patient DO NOT RESUSCITATE and to keep her comfortable.  This was also witnessed by general surgery PA as well as PCCM representative.  She was placed on morphine drip as well as Ativan for comfort.  Patient did pass comfortably at 0826.  She did have personal aide at bedside.  Patient's power of attorney [name] was made aware of patient's death.  Total critical care time as well as time spent with patient at side as well as discussing with consultants and formulating plan 60 minutes  Signed:  Cristal Ford  [facility] 09/01/2017, 12:56 PM

## 2017-09-07 NOTE — Care Management Note (Signed)
Case Management Note  Patient Details  Name: PANAGIOTA PERFETTI MRN: [id number] Date of Birth: 1932-01-28  Subjective/Objective:                  Expired this am at approx.0848  Action/Plan: none  Expected Discharge Date:  (unknown)               Expected Discharge Plan:  [place]  In-House Referral:  Clinical Social Work  Discharge planning Services  CM Consult  Post Acute Care Choice:    Choice offered to:     DME Arranged:    DME Agency:     HH Arranged:    [place] Agency:     Status of Service:  In process, will continue to follow  If discussed at Long Length of Stay Meetings, dates discussed:    Additional Comments:  Leeroy Cha, RN 09-18-2017, 11:10 AM

## 2017-09-07 NOTE — Progress Notes (Signed)
Pt HR 150s, paged Dr Ree Kida, awaiting response

## 2017-09-07 NOTE — Progress Notes (Signed)
Chaplains responding to referral by RN, end of life.   Arrived after pt death.   Chaplain Jerene Pitch and Katherene Ponto provided support with pt's caregiver, Dewaine Oats, at bedside.  Provided grief support, pastoral presence, support around role as care giver to pt's spouse who has dementia.    Dewaine Oats is calling other care givers and POA to ensure that others do not wish to see Ms. Till at hospital.  Chaplains available for continued support for this family as needed.   Please page as needs arise.   [place] / [place], [place], [facility]

## 2017-09-07 NOTE — Consult Note (Signed)
PULMONARY / CRITICAL CARE MEDICINE   Name: [name] MRN: [id number] DOB: 1932-10-30    ADMISSION DATE:  08/21/2017 CONSULTATION DATE:  September 25, 2017  REFERRING MD:  Dr. Ree Kida / [facility]   CHIEF COMPLAINT:  Hypotension, AMS   BRIEF SUMMARY:   84 y/o F, SNF resident admitted 8/19 with reports of possible syncopal event - distant stare, vomiting.  Hx of dementia, bipolar disorder, GERD, AF, HTN, IBS, OSA, RLS, Tuberculosis, Chron's disease, prior C-difficile infection.  The patient has caretakers who sit with her.  She has a court appointed HCPOA - Scientist, research (medical).  CT of the abdomen on admit was concerning for questionable gastric necrosis with moderate portal venous gas/emphysema in the wall of the stomach, diffusely dilated small/large bowel above impaction with questionable enteritis.  The patient was disimpacted in the ER.  She was admitted to [facility].  CCS consulted for evaluation and recommended antibiotics and observation.  Early am 8/20, the patient was unresponsive but had spontaneous respirations.  Labs Na 143, K 4.3, Cl 110, BUN 29, Sr Cr 1.98, glucose 208, WBC 12.3, Hgb 12.5 and platelets 195.  Lactic acid rose to 3.8, PCT 0.15.  PCCM consulted for evaluation.  On exam, the patient was pale, unresponsive, tachycardic and hypotensive.  RN's performing BVM with 100% O2.  Sats undetectable on monitor.  Patient cool/pale.  Concern for abdominal process and patient would not likely be able to make it to the OR / survive any type of intervention given her current state.  Primary MD contacted HCPOA and patient was made DNR. Conversation verified on speaker phone.  Patient does not appear to be in any discomfort.  Her primary sitter updated at bedside.  Comfort measures put in place by primary MD.    PAST MEDICAL HISTORY :  She  has a past medical history of Bipolar affective ([place]), C. difficile colitis (11/26/2014), C. difficile diarrhea, CAD (coronary artery disease), Cerebrovascular disease, Colonic  polyp, Crohn's disease ([place]), Diverticulosis of colon, Fibromyalgia, GERD (gastroesophageal reflux disease), History of atrial flutter, History of bronchitis, History of headache, History of hyperparathyroidism, History of tuberculosis, Hyperlipemia, Hypertension, Hypothyroidism, IBS (irritable bowel syndrome), Memory disturbance, Merkel cell tumor ([place]), Obesity, OSA (obstructive sleep apnea), Osteopenia, Other diseases of lung, not elsewhere classified, Psychosis ([place]), Radiation (05/2008), Restless legs syndrome (RLS), Syncope, and Tuberculosis.  PAST SURGICAL HISTORY: She  has a past surgical history that includes Parathyroid adenoma surgery (2007); Cholecystectomy; Moh';s surg left lower eye lid surgery and lid reconstruction surgery (2/10); Bladder suspension; Cataract extraction (Bilateral); Appendectomy; Tonsillectomy and adenoidectomy; Lumbar laminectomy; Incision and drainage perirectal abscess; Excision heel spur excision; Nasal sinus surgery (Right); eye lid surgery (04/04/2011); Posterior cervical fusion/foraminotomy (N/A, 10/12/2013); Breast lumpectomy with radioactive seed localization (Left, 12/08/2015); and laparotomy (N/A, 05/22/2017).  Allergies  Allergen Reactions  . Prednisone Other (See Comments)    Patient had psychiatric episode with hallucinations. Caused 3.5 week long hospitalization & loss of memory   . Carbamazepine Nausea Only and Other (See Comments)    Tegretol; dizziness, blurred vision, nausea, headache, insomnia  . Codeine Nausea And Vomiting  . Demerol [Meperidine] Nausea And Vomiting  . Diclofenac Sodium Other (See Comments)    severe headache  . Fentanyl Nausea And Vomiting and Other (See Comments)    IV Fentanyl; caused nausea and vomiting Fentanyl patch; caused chest pain, HBP, with second patch experienced vertigo and nausea  . Fluoxetine Hcl Nausea Only and Other (See Comments)    dizziness, blurred vision, nausea, headache, insomnia  .  Gentamicin     Other  reaction(s): EYE IRRITATION  . Hydromorphone Hcl Nausea And Vomiting  . Ibuprofen Other (See Comments)    Caused ulcers and colitis  . Indomethacin Nausea Only and Other (See Comments)    dizziness, blurred vision, nausea, headache, insomnia  . Lisinopril Cough  . Meperidine Hcl Nausea And Vomiting  . Morphine Nausea And Vomiting  . Pregabalin Other (See Comments)    severe restless legs, insomnia  . Propranolol Hcl Nausea Only and Other (See Comments)    dizziness, blurred vision, nausea, headache, insomnia  . Refresh Lacri-Lube [Eye Lubricant] Swelling    Caused swelling, redness, hard crust on eyelids   . Ropinirole Hcl Other (See Comments)    severe restless legs and insomnia  . Sulfamethoxazole Nausea Only and Other (See Comments)    gas, loose stools  . White Petrolatum-Mineral Oil  [Aquaphilic]     Other reaction(s): SWELLING/EDEMA  . Bacitracin Hives, Itching and Rash    redness    No current facility-administered medications on file prior to encounter.    Current Outpatient Medications on File Prior to Encounter  Medication Sig  . amLODipine (NORVASC) 5 MG tablet TAKE 1 TABLET (5 MG TOTAL) BY MOUTH DAILY.  Marland Kitchen anastrozole (ARIMIDEX) 1 MG tablet Take 1 tablet (1 mg total) by mouth daily.  Marland Kitchen aspirin 81 MG EC tablet Take 81 mg by mouth.  . ferrous sulfate 325 (65 FE) MG EC tablet Take 325 mg by mouth daily with breakfast.  . levothyroxine (SYNTHROID, LEVOTHROID) 88 MCG tablet Take 1 tablet (88 mcg total) by mouth daily before breakfast. Pt needs an appointment for further refills  . Multiple Vitamin (MULTIVITAMIN WITH MINERALS) TABS tablet Take 1 tablet by mouth daily.  Marland Kitchen Propylene Glycol (SYSTANE BALANCE) 0.6 % SOLN Place 1 drop into both eyes daily.  . risperiDONE (RISPERDAL) 0.5 MG tablet Take 0.5 mg by mouth at bedtime.   . Skin Protectants, Misc. (DIMETHICONE-ZINC OXIDE) cream Apply 1 application topically 2 (two) times daily.  Marland Kitchen atorvastatin (LIPITOR) 40 MG tablet TAKE  1 TABLET DAILY (Patient not taking: Reported on 08/27/2017)  . dicyclomine (BENTYL) 10 MG capsule Take 1 capsule (10 mg total) by mouth 3 (three) times daily before meals. (Patient not taking: Reported on 09/05/2017)  . memantine (NAMENDA) 10 MG tablet TAKE 1 TABLET (10 MG TOTAL) BY MOUTH TWO  TIMES DAILY. (Patient not taking: Reported on 08/31/2017)  . risperiDONE (RISPERDAL) 1 MG tablet Take 0.5 tablets (0.5 mg total) at bedtime by mouth. (Patient not taking: Reported on 08/31/2017)    FAMILY HISTORY:  Her family history includes Arthritis in her mother; Colon cancer in her maternal grandmother; Heart failure in her father; Hypertension in her mother; Pulmonary embolism in her unknown relative; Ulcers in her mother.  SOCIAL HISTORY: She  reports that she quit smoking about 57 years ago. Her smoking use included cigarettes. She quit after 6.00 years of use. She has never used smokeless tobacco. She reports that she does not drink alcohol or use drugs.  VITAL SIGNS: BP 109/62   Pulse (!) 105   Temp 98.1 F (36.7 C) (Oral)   Resp (!) 37   Ht 5' (1.524 m)   Wt 74.6 kg   SpO2 95%   BMI 32.12 kg/m   HEMODYNAMICS:    VENTILATOR SETTINGS:    INTAKE / OUTPUT: I/O last 3 completed shifts: In: 2649 [I.V.:343.7; IV Piggyback:2305.3] Out: -   PHYSICAL EXAMINATION: General:  Pale elderly female in  NAD Neuro:  unresponsive Cardiovascular:  s1s2 distant, tachycardic  Lungs:  Agonal respirations  Abdomen:  Distended, decreased bsx4 Skin:  Pale, cool  LABS:  BMET Recent Labs  Lab 08/30/2017 1434 Sep 25, 2017 0226  NA 144 143  K 5.3* 4.3  CL 111 110  CO2 23 19*  BUN 19 29*  CREATININE 1.26* 1.96*  GLUCOSE 108* 208*    Electrolytes Recent Labs  Lab  1434 25-Sep-2017 0226  CALCIUM 7.6* 8.2*    CBC Recent Labs  Lab 09/06/2017 1434 Sep 25, 2017 0226  WBC 7.6 12.3*  HGB 10.7* 12.5  HCT 36.1 42.4  PLT 193 195    Coag's No results for input(s): APTT, INR in the last 168  hours.  Sepsis Markers Recent Labs  Lab 08/22/2017 2022 08/31/2017 2225 09/25/2017 0226 09/25/17 0406  LATICACIDVEN  --  2.6* 3.8* 3.6*  PROCALCITON 0.15  --   --   --     ABG No results for input(s): PHART, PCO2ART, PO2ART in the last 168 hours.  Liver Enzymes Recent Labs  Lab 08/30/2017 1434  AST 54*  ALT 18  ALKPHOS 85  BILITOT 1.4*  ALBUMIN 3.1*    Cardiac Enzymes No results for input(s): TROPONINI, PROBNP in the last 168 hours.  Glucose No results for input(s): GLUCAP in the last 168 hours.  Imaging Dg Chest 2 View  Result Date: 08/19/2017 CLINICAL DATA:  Nausea, vomiting, confusion; history of hypertension, Merkel cell tumor LEFT eyelid, coronary artery disease, dementia EXAM: CHEST - 2 VIEW COMPARISON:  01/31/2017 FINDINGS: Upper normal size of cardiac silhouette. Mediastinal contours and pulmonary vascularity normal. Atherosclerotic calcification aorta. Scattered subsegmental atelectasis in the mid to lower lungs. No definite infiltrate, pleural effusion or pneumothorax. Bones demineralized with evidence of prior cervicothoracic fusion. IMPRESSION: Bibasilar atelectasis. Electronically Signed   By: Lavonia Dana M.D.   On: 09/02/2017 14:59   Ct Abdomen Pelvis W Contrast  Result Date: 09/05/2017 CLINICAL DATA:  Altered mental status.  High-grade bowel obstruction EXAM: CT ABDOMEN AND PELVIS WITH CONTRAST TECHNIQUE: Multidetector CT imaging of the abdomen and pelvis was performed using the standard protocol following bolus administration of intravenous contrast. CONTRAST:  49m ISOVUE-300 IOPAMIDOL (ISOVUE-300) INJECTION 61% COMPARISON:  05/22/2017 FINDINGS: Lower chest:  21 mm low-density mass in the left breast. Hepatobiliary: Extensive portal venous gas.Cholecystectomy with chronic bile duct dilatation an apparent transition within the pancreatic head. Pancreas: Generalized atrophy Spleen: Unremarkable. Adrenals/Urinary Tract: Negative adrenals. 10 x 22 mm stone at the right  UPJ, chronic. There is a branching calculus in the left lower pole. No hydronephrosis. Unremarkable bladder. Stomach/Bowel: There is gastric emphysema with portal venous gas reaching the liver. The small and large bowel is diffusely dilated, reaching a distended rectum containing stool and measuring up to 8 cm in diameter. Colonic diverticulosis. No pericecal inflammation seen Vascular/Lymphatic: Portal venous gas in the central venous structures. There is extensive atherosclerotic plaque with stenosis of SMA and celiac. Diffuse aortic and iliac stenosis. No mass or adenopathy. Reproductive:No acute finding Other: No ascites or pneumoperitoneum. Musculoskeletal: Advanced degenerative disease with scoliosis. L3-4 and L4-5 laminectomy. Critical Value/emergent results were called by telephone at the time of interpretation on 09/03/2017 at 5:06 pm to Dr. SSherwood Gambler, who verbally acknowledged these results. IMPRESSION: 1. Gastric emphysema and extensive portal venous gas. Need clinical correlation to distinguish benign emphysema (as from patient vomiting) and gastric necrosis. 2. Diffusely dilated small and large bowel above a rectal impaction. Small and large bowel fluid levels, there could be  a superimposed enteritis. 3. Advanced atherosclerosis. Electronically Signed   By: Monte Fantasia M.D.   On: 08/23/2017 17:06     Noe Gens, NP-C [facility] Pgr: [phone] or if no answer [phone] 09/22/17, 8:05 AM

## 2017-09-07 NOTE — Progress Notes (Signed)
Patient made DNR by POA via MD discussion. Patient passed with staff and long term sitter at bedside. Sitter, Dewaine Oats, states patient's husband has dementia and also resides in a facility. Daughter not involved in care. Patient cleaned up and sitter visiting, chaplain called.

## 2017-09-07 NOTE — Progress Notes (Signed)
CRITICAL VALUE ALERT  Critical Value:  Lactic 3.8   Date & Time Notified:  0300  Provider Notified: Schorr  Orders Received/Actions taken: Awaiting orders

## 2017-09-07 NOTE — Progress Notes (Addendum)
CC:  Nausea and emesis with hx of SBO Dementia and altered Mental status  Subjective: Pt is unresponsive with heart rate up, and respiratory is up.  The POA has not been available for contact.  Films pending.  CCM has been contacted.    Objective: Vital signs in last 24 hours: Temp:  [97.5 F (36.4 C)-98.7 F (37.1 C)] 98.1 F (36.7 C) (08/20 0300) Pulse Rate:  [100-151] 105 (08/20 0605) Resp:  [15-44] 37 (08/20 0605) BP: (107-174)/(45-140) 109/62 (08/20 0605) SpO2:  [92 %-100 %] 95 % (08/20 0605) Weight:  [74.6 kg] 74.6 kg (08/19 2000) Last BM Date: 09-01-17 2649 IV Urine x 1 Emesis x 1 Stool x 1 Afebrile, Tachycardic HR up to 140's Respiratory rate up 30's Creatinine is rising 1.26 >> 1.96 Lactate rising 2.2 >> 3.6 WBC up 7.6 >> 12.3 H/H is up   10.7/36.1 >> 12.5/42.4 Platelets 193,000 >> 195,000 Positive UTI Intake/Output from previous day: 08/19 0701 - 08/20 0700 In: 2649 [I.V.:343.7; IV Piggyback:2305.3] Out: -  Intake/Output this shift: No intake/output data recorded.  General appearance: Pt is unresponsive, this started around 7:20 AM per care taker.   Resp: sounds wet, rapid respirations Cardio: Tachycardic mostly sinus - some AF GI: distended, no BS, she is not responsive  Lab Results:  Recent Labs    08/17/2017 1434 09/01/17 0226  WBC 7.6 12.3*  HGB 10.7* 12.5  HCT 36.1 42.4  PLT 193 195    BMET Recent Labs    08/20/2017 1434 01-Sep-2017 0226  NA 144 143  K 5.3* 4.3  CL 111 110  CO2 23 19*  GLUCOSE 108* 208*  BUN 19 29*  CREATININE 1.26* 1.96*  CALCIUM 7.6* 8.2*   PT/INR No results for input(s): LABPROT, INR in the last 72 hours.  Recent Labs  Lab 09/01/2017 1434  AST 54*  ALT 18  ALKPHOS 85  BILITOT 1.4*  PROT 5.9*  ALBUMIN 3.1*     Lipase     Component Value Date/Time   LIPASE 23 08/24/2017 1434     Medications: . Chlorhexidine Gluconate Cloth  6 each Topical Q0600  . heparin  5,000 Units Subcutaneous Q8H  .  levothyroxine  44 mcg Intravenous Daily  .  morphine injection  2 mg Intravenous Once  . mupirocin ointment  1 application Nasal BID  . polyvinyl alcohol  1 drop Both Eyes Daily  . risperiDONE  0.5 mg Oral QHS  . zinc oxide  1 application Topical BID   . sodium chloride Stopped (08/19/2017 2029)  . sodium chloride Stopped (09/01/2017 0337)  . cefTRIAXone (ROCEPHIN)  IV    . metronidazole Stopped (01-Sep-2017 0442)   Anti-infectives (From admission, onward)   Start     Dose/Rate Route Frequency Ordered Stop   September 01, 2017 1800  cefTRIAXone (ROCEPHIN) 1 g in sodium chloride 0.9 % 100 mL IVPB     1 g 200 mL/hr over 30 Minutes Intravenous Every 24 hours 08/30/2017 2017     09-01-2017 0400  metroNIDAZOLE (FLAGYL) IVPB 500 mg     500 mg 100 mL/hr over 60 Minutes Intravenous Every 8 hours 08/09/2017 2017     08/15/2017 1730  metroNIDAZOLE (FLAGYL) IVPB 500 mg     500 mg 100 mL/hr over 60 Minutes Intravenous  Once 08/27/2017 1726 08/13/2017 2018   08/31/2017 1700  cefTRIAXone (ROCEPHIN) 1 g in sodium chloride 0.9 % 100 mL IVPB     1 g 200 mL/hr over 30 Minutes Intravenous  Once  09/01/2017 1654 08/27/2017 1816      Assessment/Plan Hx of bipolar affective disorder with hx of psychosis Dementia HTN -  HLD Hypothyroidism -  IBS/Crohn's Disease RLS UTI Acute on chronic CKD Hx of Atrial flutter/A. Fib -   Portal venous gas/gastric wall emphysema - uncertain etiology S/P multiple abdominal surgeries Exploratory laparotomy, reduction and primary repair of incarcerated ventral wall hernia, small bowel resection 05/22/2017 Dr. Michael Boston    FEN:  IV fluids ID:  Rocephin/Flagyl 8/19 =>> day 2 DVT:  Heparin  Plan: Dr. Ree Kida is trying to get in contact with the POA, the family is apparently not making any decisions for either parent.  The patient  is close to respiratory failure.  With her hx and current condition she is a poor surgical candidate, and poor long term outlook.   We are attempting to get Goals of  Care, and will follow with you.    POA has been contacted and Made DNR by Beverly Gust.  I also listened to the conversation on speaker phone.       LOS: 1 day    Atoya Andrew 08-Sep-2017 [phone]

## 2017-09-07 NOTE — Progress Notes (Signed)
   09/19/17 1038  Clinical Encounter Type  Visited With Patient and family together;Health care provider  Visit Type Follow-up  Referral From Nurse  Consult/Referral To Chaplain  Spiritual Encounters  Spiritual Needs Grief support;Emotional;Prayer  Stress Factors  Family Stress Factors Loss   Chaplain Jerene Pitch and I responded to a page that the patient's daughter had arrived.  Daughter was distraught and on the floor with one of the nurses.  Fish farm manager and I provided support and care for the daughter, [name].  I stayed with her until Michelle's husband arrived and we prayed together.  Family stay for a little while longer and they have now gone.  [name] did not take the bag of clothes with her.   Chaplain Katherene Ponto

## 2017-09-07 NOTE — Progress Notes (Signed)
Body taken to morgue.

## 2017-09-07 NOTE — Progress Notes (Signed)
Patient unresponsive.o2 sats 79 ,nonrebreather mask placed on pt Skin -  Sweaty,lungs sound wet. Dr Ree Kida and [place] in .Lasix 20 mgm iv given.Stat labs to be drawn.

## 2017-09-07 DEATH — deceased

## 2017-09-30 ENCOUNTER — Ambulatory Visit: Payer: Medicare Other | Admitting: Hematology

## 2017-09-30 ENCOUNTER — Other Ambulatory Visit: Payer: Medicare Other

## 2018-06-15 ENCOUNTER — Other Ambulatory Visit: Payer: Self-pay | Admitting: *Deleted
